# Patient Record
Sex: Female | Born: 1940 | Race: White | Hispanic: No | Smoking: Former smoker
Health system: Southern US, Community
[De-identification: ages and names within clinical notes are randomized; demographics above are authoritative.]

## PROBLEM LIST (undated history)

## (undated) DIAGNOSIS — C55 Malignant neoplasm of uterus, part unspecified: Secondary | ICD-10-CM

## (undated) DIAGNOSIS — Z8619 Personal history of other infectious and parasitic diseases: Secondary | ICD-10-CM

## (undated) DIAGNOSIS — G4700 Insomnia, unspecified: Secondary | ICD-10-CM

## (undated) DIAGNOSIS — F419 Anxiety disorder, unspecified: Secondary | ICD-10-CM

## (undated) DIAGNOSIS — K5790 Diverticulosis of intestine, part unspecified, without perforation or abscess without bleeding: Secondary | ICD-10-CM

## (undated) DIAGNOSIS — K648 Other hemorrhoids: Secondary | ICD-10-CM

## (undated) DIAGNOSIS — M1990 Unspecified osteoarthritis, unspecified site: Secondary | ICD-10-CM

## (undated) DIAGNOSIS — F32A Depression, unspecified: Secondary | ICD-10-CM

## (undated) DIAGNOSIS — M2550 Pain in unspecified joint: Secondary | ICD-10-CM

## (undated) DIAGNOSIS — F329 Major depressive disorder, single episode, unspecified: Secondary | ICD-10-CM

## (undated) DIAGNOSIS — G8929 Other chronic pain: Secondary | ICD-10-CM

## (undated) DIAGNOSIS — M779 Enthesopathy, unspecified: Secondary | ICD-10-CM

## (undated) DIAGNOSIS — M549 Dorsalgia, unspecified: Secondary | ICD-10-CM

## (undated) DIAGNOSIS — D649 Anemia, unspecified: Secondary | ICD-10-CM

## (undated) DIAGNOSIS — E785 Hyperlipidemia, unspecified: Secondary | ICD-10-CM

## (undated) DIAGNOSIS — G0490 Encephalitis and encephalomyelitis, unspecified: Secondary | ICD-10-CM

## (undated) DIAGNOSIS — K589 Irritable bowel syndrome without diarrhea: Secondary | ICD-10-CM

## (undated) DIAGNOSIS — K759 Inflammatory liver disease, unspecified: Secondary | ICD-10-CM

## (undated) DIAGNOSIS — J189 Pneumonia, unspecified organism: Secondary | ICD-10-CM

## (undated) DIAGNOSIS — K219 Gastro-esophageal reflux disease without esophagitis: Secondary | ICD-10-CM

## (undated) DIAGNOSIS — R918 Other nonspecific abnormal finding of lung field: Secondary | ICD-10-CM

## (undated) DIAGNOSIS — M2540 Effusion, unspecified joint: Secondary | ICD-10-CM

## (undated) DIAGNOSIS — F0390 Unspecified dementia without behavioral disturbance: Secondary | ICD-10-CM

## (undated) DIAGNOSIS — R42 Dizziness and giddiness: Secondary | ICD-10-CM

## (undated) DIAGNOSIS — K760 Fatty (change of) liver, not elsewhere classified: Secondary | ICD-10-CM

## (undated) DIAGNOSIS — Z860100 Personal history of colon polyps, unspecified: Secondary | ICD-10-CM

## (undated) DIAGNOSIS — N281 Cyst of kidney, acquired: Secondary | ICD-10-CM

## (undated) DIAGNOSIS — Z8709 Personal history of other diseases of the respiratory system: Secondary | ICD-10-CM

## (undated) DIAGNOSIS — G43909 Migraine, unspecified, not intractable, without status migrainosus: Secondary | ICD-10-CM

## (undated) HISTORY — DX: Depression, unspecified: F32.A

## (undated) HISTORY — DX: Inflammatory liver disease, unspecified: K75.9

## (undated) HISTORY — DX: Migraine, unspecified, not intractable, without status migrainosus: G43.909

## (undated) HISTORY — DX: Unspecified dementia, unspecified severity, without behavioral disturbance, psychotic disturbance, mood disturbance, and anxiety: F03.90

## (undated) HISTORY — PX: COLONOSCOPY: SHX174

## (undated) HISTORY — DX: Malignant neoplasm of uterus, part unspecified: C55

## (undated) HISTORY — PX: TONSILLECTOMY: SUR1361

## (undated) HISTORY — PX: UPPER GASTROINTESTINAL ENDOSCOPY: SHX188

## (undated) HISTORY — DX: Encephalitis and encephalomyelitis, unspecified: G04.90

## (undated) HISTORY — PX: OTHER SURGICAL HISTORY: SHX169

## (undated) HISTORY — DX: Anxiety disorder, unspecified: F41.9

## (undated) HISTORY — DX: Gastro-esophageal reflux disease without esophagitis: K21.9

## (undated) HISTORY — DX: Enthesopathy, unspecified: M77.9

## (undated) HISTORY — DX: Other hemorrhoids: K64.8

## (undated) HISTORY — DX: Major depressive disorder, single episode, unspecified: F32.9

## (undated) HISTORY — DX: Diverticulosis of intestine, part unspecified, without perforation or abscess without bleeding: K57.90

## (undated) HISTORY — DX: Hyperlipidemia, unspecified: E78.5

## (undated) HISTORY — DX: Other nonspecific abnormal finding of lung field: R91.8

## (undated) HISTORY — DX: Anemia, unspecified: D64.9

## (undated) HISTORY — DX: Unspecified osteoarthritis, unspecified site: M19.90

---

## 1964-02-08 HISTORY — PX: APPENDECTOMY: SHX54

## 1971-02-08 DIAGNOSIS — C55 Malignant neoplasm of uterus, part unspecified: Secondary | ICD-10-CM

## 1971-02-08 HISTORY — PX: VAGINAL HYSTERECTOMY: SUR661

## 1971-02-08 HISTORY — DX: Malignant neoplasm of uterus, part unspecified: C55

## 2000-04-27 ENCOUNTER — Encounter: Admission: RE | Admit: 2000-04-27 | Discharge: 2000-04-27 | Payer: Self-pay | Admitting: Hematology and Oncology

## 2000-06-09 ENCOUNTER — Encounter: Admission: RE | Admit: 2000-06-09 | Discharge: 2000-06-09 | Payer: Self-pay | Admitting: Internal Medicine

## 2000-06-09 ENCOUNTER — Encounter: Payer: Self-pay | Admitting: Internal Medicine

## 2000-06-09 ENCOUNTER — Ambulatory Visit (HOSPITAL_COMMUNITY): Admission: RE | Admit: 2000-06-09 | Discharge: 2000-06-09 | Payer: Self-pay | Admitting: Internal Medicine

## 2000-08-16 ENCOUNTER — Encounter: Admission: RE | Admit: 2000-08-16 | Discharge: 2000-08-16 | Payer: Self-pay | Admitting: Internal Medicine

## 2000-09-29 ENCOUNTER — Encounter: Admission: RE | Admit: 2000-09-29 | Discharge: 2000-09-29 | Payer: Self-pay | Admitting: Internal Medicine

## 2000-09-29 ENCOUNTER — Encounter: Payer: Self-pay | Admitting: Internal Medicine

## 2000-10-06 ENCOUNTER — Encounter: Payer: Self-pay | Admitting: Internal Medicine

## 2000-10-06 ENCOUNTER — Ambulatory Visit (HOSPITAL_COMMUNITY): Admission: RE | Admit: 2000-10-06 | Discharge: 2000-10-06 | Payer: Self-pay | Admitting: Internal Medicine

## 2000-11-22 ENCOUNTER — Encounter: Admission: RE | Admit: 2000-11-22 | Discharge: 2000-11-22 | Payer: Self-pay | Admitting: Internal Medicine

## 2001-03-07 ENCOUNTER — Encounter: Admission: RE | Admit: 2001-03-07 | Discharge: 2001-03-07 | Payer: Self-pay

## 2001-04-11 ENCOUNTER — Encounter: Admission: RE | Admit: 2001-04-11 | Discharge: 2001-04-11 | Payer: Self-pay

## 2001-04-11 ENCOUNTER — Encounter: Payer: Self-pay | Admitting: Internal Medicine

## 2001-04-11 ENCOUNTER — Ambulatory Visit (HOSPITAL_COMMUNITY): Admission: RE | Admit: 2001-04-11 | Discharge: 2001-04-11 | Payer: Self-pay | Admitting: Internal Medicine

## 2001-05-04 ENCOUNTER — Encounter: Payer: Self-pay | Admitting: Urology

## 2001-05-04 ENCOUNTER — Ambulatory Visit (HOSPITAL_COMMUNITY): Admission: RE | Admit: 2001-05-04 | Discharge: 2001-05-04 | Payer: Self-pay | Admitting: Urology

## 2001-07-02 ENCOUNTER — Encounter: Admission: RE | Admit: 2001-07-02 | Discharge: 2001-07-02 | Payer: Self-pay | Admitting: Internal Medicine

## 2001-08-21 ENCOUNTER — Encounter: Admission: RE | Admit: 2001-08-21 | Discharge: 2001-08-21 | Payer: Self-pay | Admitting: Internal Medicine

## 2001-09-03 ENCOUNTER — Encounter: Admission: RE | Admit: 2001-09-03 | Discharge: 2001-09-03 | Payer: Self-pay | Admitting: Internal Medicine

## 2001-11-07 ENCOUNTER — Encounter: Payer: Self-pay | Admitting: Orthopedic Surgery

## 2001-11-07 ENCOUNTER — Encounter: Admission: RE | Admit: 2001-11-07 | Discharge: 2001-11-07 | Payer: Self-pay | Admitting: Orthopedic Surgery

## 2001-11-20 ENCOUNTER — Encounter: Admission: RE | Admit: 2001-11-20 | Discharge: 2001-12-25 | Payer: Self-pay | Admitting: Orthopedic Surgery

## 2001-11-26 ENCOUNTER — Encounter: Admission: RE | Admit: 2001-11-26 | Discharge: 2001-11-26 | Payer: Self-pay | Admitting: Internal Medicine

## 2001-12-10 ENCOUNTER — Ambulatory Visit (HOSPITAL_COMMUNITY): Admission: RE | Admit: 2001-12-10 | Discharge: 2001-12-10 | Payer: Self-pay | Admitting: Internal Medicine

## 2001-12-19 ENCOUNTER — Ambulatory Visit (HOSPITAL_COMMUNITY): Admission: RE | Admit: 2001-12-19 | Discharge: 2001-12-19 | Payer: Self-pay | Admitting: Internal Medicine

## 2001-12-19 ENCOUNTER — Encounter: Payer: Self-pay | Admitting: Internal Medicine

## 2002-04-29 ENCOUNTER — Ambulatory Visit (HOSPITAL_COMMUNITY): Admission: RE | Admit: 2002-04-29 | Discharge: 2002-04-29 | Payer: Self-pay | Admitting: Internal Medicine

## 2002-04-29 ENCOUNTER — Encounter: Admission: RE | Admit: 2002-04-29 | Discharge: 2002-04-29 | Payer: Self-pay | Admitting: Internal Medicine

## 2002-10-29 ENCOUNTER — Encounter: Admission: RE | Admit: 2002-10-29 | Discharge: 2002-10-29 | Payer: Self-pay | Admitting: Internal Medicine

## 2002-11-28 ENCOUNTER — Encounter: Admission: RE | Admit: 2002-11-28 | Discharge: 2002-11-28 | Payer: Self-pay | Admitting: Internal Medicine

## 2002-12-06 ENCOUNTER — Encounter: Admission: RE | Admit: 2002-12-06 | Discharge: 2002-12-06 | Payer: Self-pay | Admitting: Family Medicine

## 2003-01-01 ENCOUNTER — Encounter: Admission: RE | Admit: 2003-01-01 | Discharge: 2003-01-01 | Payer: Self-pay | Admitting: Internal Medicine

## 2003-02-14 ENCOUNTER — Emergency Department (HOSPITAL_COMMUNITY): Admission: AD | Admit: 2003-02-14 | Discharge: 2003-02-14 | Payer: Self-pay | Admitting: Family Medicine

## 2003-09-01 ENCOUNTER — Encounter: Admission: RE | Admit: 2003-09-01 | Discharge: 2003-09-01 | Payer: Self-pay | Admitting: Internal Medicine

## 2003-09-05 ENCOUNTER — Encounter: Admission: RE | Admit: 2003-09-05 | Discharge: 2003-09-05 | Payer: Self-pay | Admitting: Internal Medicine

## 2003-09-10 ENCOUNTER — Ambulatory Visit (HOSPITAL_COMMUNITY): Admission: RE | Admit: 2003-09-10 | Discharge: 2003-09-10 | Payer: Self-pay | Admitting: Internal Medicine

## 2003-11-13 ENCOUNTER — Ambulatory Visit: Payer: Self-pay | Admitting: Internal Medicine

## 2003-12-31 ENCOUNTER — Emergency Department (HOSPITAL_COMMUNITY): Admission: EM | Admit: 2003-12-31 | Discharge: 2003-12-31 | Payer: Self-pay | Admitting: Family Medicine

## 2004-01-07 ENCOUNTER — Ambulatory Visit: Payer: Self-pay | Admitting: Internal Medicine

## 2004-01-14 ENCOUNTER — Ambulatory Visit: Payer: Self-pay | Admitting: Internal Medicine

## 2004-01-19 ENCOUNTER — Ambulatory Visit: Payer: Self-pay | Admitting: Internal Medicine

## 2004-01-25 ENCOUNTER — Emergency Department (HOSPITAL_COMMUNITY): Admission: EM | Admit: 2004-01-25 | Discharge: 2004-01-25 | Payer: Self-pay | Admitting: Family Medicine

## 2004-02-12 ENCOUNTER — Emergency Department (HOSPITAL_COMMUNITY): Admission: EM | Admit: 2004-02-12 | Discharge: 2004-02-12 | Payer: Self-pay | Admitting: Family Medicine

## 2004-04-08 ENCOUNTER — Ambulatory Visit: Payer: Self-pay | Admitting: Internal Medicine

## 2004-04-15 ENCOUNTER — Ambulatory Visit: Payer: Self-pay | Admitting: Internal Medicine

## 2004-04-18 ENCOUNTER — Ambulatory Visit (HOSPITAL_COMMUNITY): Admission: RE | Admit: 2004-04-18 | Discharge: 2004-04-18 | Payer: Self-pay | Admitting: Internal Medicine

## 2004-07-01 ENCOUNTER — Ambulatory Visit: Payer: Self-pay | Admitting: Internal Medicine

## 2004-10-15 ENCOUNTER — Ambulatory Visit: Payer: Self-pay | Admitting: *Deleted

## 2004-10-18 ENCOUNTER — Ambulatory Visit: Payer: Self-pay | Admitting: *Deleted

## 2004-10-22 ENCOUNTER — Ambulatory Visit: Payer: Self-pay | Admitting: *Deleted

## 2004-10-25 ENCOUNTER — Ambulatory Visit: Payer: Self-pay | Admitting: *Deleted

## 2004-11-01 ENCOUNTER — Ambulatory Visit: Payer: Self-pay | Admitting: *Deleted

## 2004-11-08 ENCOUNTER — Ambulatory Visit: Payer: Self-pay | Admitting: *Deleted

## 2004-11-22 ENCOUNTER — Ambulatory Visit: Payer: Self-pay | Admitting: *Deleted

## 2004-11-26 ENCOUNTER — Ambulatory Visit: Payer: Self-pay | Admitting: Internal Medicine

## 2004-11-26 ENCOUNTER — Ambulatory Visit: Payer: Self-pay | Admitting: *Deleted

## 2004-11-29 ENCOUNTER — Ambulatory Visit: Payer: Self-pay | Admitting: *Deleted

## 2004-12-15 ENCOUNTER — Ambulatory Visit: Payer: Self-pay | Admitting: *Deleted

## 2004-12-20 ENCOUNTER — Ambulatory Visit: Payer: Self-pay | Admitting: *Deleted

## 2004-12-23 ENCOUNTER — Ambulatory Visit: Payer: Self-pay | Admitting: Family Medicine

## 2005-01-03 ENCOUNTER — Ambulatory Visit: Payer: Self-pay | Admitting: *Deleted

## 2005-01-17 ENCOUNTER — Ambulatory Visit: Payer: Self-pay | Admitting: *Deleted

## 2005-02-18 ENCOUNTER — Ambulatory Visit: Payer: Self-pay | Admitting: *Deleted

## 2005-03-03 ENCOUNTER — Ambulatory Visit: Payer: Self-pay | Admitting: Family Medicine

## 2005-03-04 ENCOUNTER — Ambulatory Visit: Payer: Self-pay | Admitting: *Deleted

## 2005-03-09 ENCOUNTER — Ambulatory Visit (HOSPITAL_COMMUNITY): Admission: RE | Admit: 2005-03-09 | Discharge: 2005-03-09 | Payer: Self-pay | Admitting: Family Medicine

## 2005-03-18 ENCOUNTER — Ambulatory Visit: Payer: Self-pay | Admitting: Family Medicine

## 2005-03-28 ENCOUNTER — Ambulatory Visit: Payer: Self-pay | Admitting: *Deleted

## 2005-04-11 ENCOUNTER — Ambulatory Visit: Payer: Self-pay | Admitting: *Deleted

## 2005-04-25 ENCOUNTER — Ambulatory Visit: Payer: Self-pay | Admitting: *Deleted

## 2005-05-09 ENCOUNTER — Ambulatory Visit: Payer: Self-pay | Admitting: *Deleted

## 2005-05-16 ENCOUNTER — Ambulatory Visit: Payer: Self-pay | Admitting: *Deleted

## 2005-05-23 ENCOUNTER — Ambulatory Visit: Payer: Self-pay | Admitting: *Deleted

## 2005-05-30 ENCOUNTER — Ambulatory Visit: Payer: Self-pay | Admitting: *Deleted

## 2005-06-06 ENCOUNTER — Ambulatory Visit: Payer: Self-pay | Admitting: *Deleted

## 2005-06-20 ENCOUNTER — Ambulatory Visit: Payer: Self-pay | Admitting: *Deleted

## 2005-07-08 ENCOUNTER — Ambulatory Visit (HOSPITAL_COMMUNITY): Admission: RE | Admit: 2005-07-08 | Discharge: 2005-07-08 | Payer: Self-pay | Admitting: Neurological Surgery

## 2005-07-11 ENCOUNTER — Ambulatory Visit: Payer: Self-pay | Admitting: *Deleted

## 2005-07-19 ENCOUNTER — Ambulatory Visit: Payer: Self-pay | Admitting: Family Medicine

## 2005-07-25 ENCOUNTER — Ambulatory Visit: Payer: Self-pay | Admitting: *Deleted

## 2005-08-01 ENCOUNTER — Ambulatory Visit: Payer: Self-pay | Admitting: *Deleted

## 2005-08-08 ENCOUNTER — Ambulatory Visit: Payer: Self-pay | Admitting: Family Medicine

## 2005-08-17 ENCOUNTER — Ambulatory Visit: Payer: Self-pay | Admitting: *Deleted

## 2005-08-24 ENCOUNTER — Ambulatory Visit: Payer: Self-pay | Admitting: *Deleted

## 2005-08-31 ENCOUNTER — Ambulatory Visit: Payer: Self-pay | Admitting: *Deleted

## 2005-09-07 ENCOUNTER — Ambulatory Visit: Payer: Self-pay | Admitting: *Deleted

## 2005-09-12 ENCOUNTER — Ambulatory Visit: Payer: Self-pay | Admitting: Family Medicine

## 2005-10-19 ENCOUNTER — Ambulatory Visit: Payer: Self-pay | Admitting: *Deleted

## 2005-10-24 ENCOUNTER — Ambulatory Visit: Payer: Self-pay | Admitting: Family Medicine

## 2005-11-02 ENCOUNTER — Ambulatory Visit: Payer: Self-pay | Admitting: *Deleted

## 2005-11-08 ENCOUNTER — Ambulatory Visit: Payer: Self-pay | Admitting: Family Medicine

## 2005-11-14 ENCOUNTER — Ambulatory Visit (HOSPITAL_COMMUNITY): Admission: RE | Admit: 2005-11-14 | Discharge: 2005-11-14 | Payer: Self-pay | Admitting: Family Medicine

## 2005-11-15 DIAGNOSIS — F419 Anxiety disorder, unspecified: Secondary | ICD-10-CM | POA: Insufficient documentation

## 2005-11-15 DIAGNOSIS — F411 Generalized anxiety disorder: Secondary | ICD-10-CM | POA: Insufficient documentation

## 2005-11-15 DIAGNOSIS — M129 Arthropathy, unspecified: Secondary | ICD-10-CM | POA: Insufficient documentation

## 2005-11-15 DIAGNOSIS — K589 Irritable bowel syndrome without diarrhea: Secondary | ICD-10-CM | POA: Insufficient documentation

## 2005-11-15 DIAGNOSIS — F32A Depression, unspecified: Secondary | ICD-10-CM | POA: Insufficient documentation

## 2005-12-21 ENCOUNTER — Ambulatory Visit: Payer: Self-pay | Admitting: *Deleted

## 2005-12-22 ENCOUNTER — Ambulatory Visit: Payer: Self-pay | Admitting: Family Medicine

## 2005-12-22 DIAGNOSIS — J984 Other disorders of lung: Secondary | ICD-10-CM | POA: Insufficient documentation

## 2005-12-22 DIAGNOSIS — M48061 Spinal stenosis, lumbar region without neurogenic claudication: Secondary | ICD-10-CM | POA: Insufficient documentation

## 2005-12-22 LAB — CONVERTED CEMR LAB: Blood Glucose, Fasting: 121 mg/dL

## 2005-12-30 ENCOUNTER — Ambulatory Visit: Payer: Self-pay | Admitting: Family Medicine

## 2006-01-02 ENCOUNTER — Ambulatory Visit: Payer: Self-pay | Admitting: Family Medicine

## 2006-01-02 ENCOUNTER — Telehealth: Payer: Self-pay | Admitting: Family Medicine

## 2006-01-04 ENCOUNTER — Telehealth: Payer: Self-pay | Admitting: Family Medicine

## 2006-01-11 ENCOUNTER — Ambulatory Visit: Payer: Self-pay | Admitting: *Deleted

## 2006-01-16 ENCOUNTER — Ambulatory Visit: Payer: Self-pay | Admitting: Family Medicine

## 2006-01-23 ENCOUNTER — Ambulatory Visit (HOSPITAL_COMMUNITY): Admission: RE | Admit: 2006-01-23 | Discharge: 2006-01-23 | Payer: Self-pay | Admitting: Family Medicine

## 2006-01-24 ENCOUNTER — Ambulatory Visit: Payer: Self-pay | Admitting: Family Medicine

## 2006-03-15 ENCOUNTER — Telehealth: Payer: Self-pay | Admitting: Family Medicine

## 2006-05-19 ENCOUNTER — Ambulatory Visit: Payer: Self-pay | Admitting: *Deleted

## 2006-06-10 ENCOUNTER — Emergency Department (HOSPITAL_COMMUNITY): Admission: EM | Admit: 2006-06-10 | Discharge: 2006-06-10 | Payer: Self-pay | Admitting: Family Medicine

## 2006-08-25 ENCOUNTER — Ambulatory Visit: Payer: Self-pay | Admitting: Family Medicine

## 2006-08-25 DIAGNOSIS — K5900 Constipation, unspecified: Secondary | ICD-10-CM | POA: Insufficient documentation

## 2006-08-25 LAB — CONVERTED CEMR LAB
Blood Glucose, Fasting: 101 mg/dL
Hgb A1c MFr Bld: 5.6 %

## 2006-09-12 ENCOUNTER — Encounter: Payer: Self-pay | Admitting: Family Medicine

## 2006-11-01 ENCOUNTER — Ambulatory Visit: Payer: Self-pay | Admitting: *Deleted

## 2006-11-08 ENCOUNTER — Ambulatory Visit: Payer: Self-pay | Admitting: Family Medicine

## 2006-11-24 ENCOUNTER — Ambulatory Visit: Payer: Self-pay | Admitting: Family Medicine

## 2006-11-24 LAB — CONVERTED CEMR LAB
ALT: 28 units/L (ref 0–35)
AST: 27 units/L (ref 0–37)
Albumin: 4.5 g/dL (ref 3.5–5.2)
Alkaline Phosphatase: 88 units/L (ref 39–117)
Anti Nuclear Antibody(ANA): NEGATIVE
BUN: 12 mg/dL (ref 6–23)
CO2: 26 meq/L (ref 19–32)
Calcium: 9.8 mg/dL (ref 8.4–10.5)
Chloride: 104 meq/L (ref 96–112)
Cholesterol: 187 mg/dL (ref 0–200)
Creatinine, Ser: 0.76 mg/dL (ref 0.40–1.20)
Glucose, Bld: 99 mg/dL (ref 70–99)
HCT: 42.2 % (ref 36.0–46.0)
HDL: 51 mg/dL (ref >39)
Hemoglobin: 13.9 g/dL (ref 12.0–15.0)
LDL Cholesterol: 101 mg/dL — ABNORMAL HIGH (ref 0–99)
MCHC: 32.9 g/dL (ref 30.0–36.0)
MCV: 86.5 fL (ref 78.0–100.0)
Platelets: 240 10*3/uL (ref 150–400)
Potassium: 5.1 meq/L (ref 3.5–5.3)
RBC: 4.88 M/uL (ref 3.87–5.11)
RDW: 13.6 % (ref 11.5–14.0)
Rhuematoid fact SerPl-aCnc: <20 intl units/mL (ref 0–20)
Sed Rate: 15 mm/hr (ref 0–22)
Sodium: 143 meq/L (ref 135–145)
TSH: 1.722 microintl units/mL (ref 0.350–5.50)
Total Bilirubin: 0.3 mg/dL (ref 0.3–1.2)
Total CHOL/HDL Ratio: 3.7
Total Protein: 7.2 g/dL (ref 6.0–8.3)
Triglycerides: 174 mg/dL — ABNORMAL HIGH (ref <150)
VLDL: 35 mg/dL (ref 0–40)
WBC: 5.8 10*3/uL (ref 4.0–10.5)

## 2006-11-28 ENCOUNTER — Telehealth: Payer: Self-pay | Admitting: Family Medicine

## 2006-11-29 ENCOUNTER — Ambulatory Visit (HOSPITAL_COMMUNITY): Admission: RE | Admit: 2006-11-29 | Discharge: 2006-11-29 | Payer: Self-pay | Admitting: Family Medicine

## 2007-01-03 ENCOUNTER — Ambulatory Visit: Payer: Self-pay | Admitting: *Deleted

## 2007-01-15 ENCOUNTER — Ambulatory Visit: Payer: Self-pay | Admitting: Family Medicine

## 2007-01-19 ENCOUNTER — Ambulatory Visit: Payer: Self-pay | Admitting: *Deleted

## 2007-02-07 ENCOUNTER — Ambulatory Visit: Payer: Self-pay | Admitting: *Deleted

## 2007-02-08 HISTORY — PX: ROTATOR CUFF REPAIR: SHX139

## 2007-04-16 ENCOUNTER — Ambulatory Visit: Payer: Self-pay | Admitting: Family Medicine

## 2007-06-04 ENCOUNTER — Encounter: Admission: RE | Admit: 2007-06-04 | Discharge: 2007-06-04 | Payer: Self-pay | Admitting: Family Medicine

## 2007-06-04 ENCOUNTER — Ambulatory Visit: Payer: Self-pay | Admitting: Family Medicine

## 2007-06-05 ENCOUNTER — Ambulatory Visit: Payer: Self-pay | Admitting: Family Medicine

## 2007-06-05 DIAGNOSIS — R3129 Other microscopic hematuria: Secondary | ICD-10-CM | POA: Insufficient documentation

## 2007-06-05 LAB — CONVERTED CEMR LAB
Bilirubin Urine: NEGATIVE
Glucose, Urine, Semiquant: NEGATIVE
Ketones, urine, test strip: NEGATIVE
Nitrite: NEGATIVE
Protein, U semiquant: NEGATIVE
Specific Gravity, Urine: <1.005
Urobilinogen, UA: 0.2
pH: 5.5

## 2007-06-06 ENCOUNTER — Encounter: Payer: Self-pay | Admitting: Family Medicine

## 2007-06-06 LAB — CONVERTED CEMR LAB
Bacteria, UA: NONE SEEN
Bilirubin Urine: NEGATIVE
Hemoglobin, Urine: NEGATIVE
Ketones, ur: NEGATIVE mg/dL
Nitrite: NEGATIVE
Protein, ur: NEGATIVE mg/dL
RBC / HPF: NONE SEEN (ref <3)
Specific Gravity, Urine: 1.01 (ref 1.005–1.03)
Urine Glucose: NEGATIVE mg/dL
Urobilinogen, UA: 0.2 (ref 0.0–1.0)
WBC, UA: NONE SEEN cells/hpf (ref <3)
pH: 5.5 (ref 5.0–8.0)

## 2007-06-13 ENCOUNTER — Encounter: Payer: Self-pay | Admitting: Family Medicine

## 2007-09-26 ENCOUNTER — Ambulatory Visit: Payer: Self-pay | Admitting: Family Medicine

## 2007-11-08 ENCOUNTER — Encounter: Admission: RE | Admit: 2007-11-08 | Discharge: 2007-11-28 | Payer: Self-pay | Admitting: Orthopedic Surgery

## 2007-11-12 ENCOUNTER — Ambulatory Visit: Payer: Self-pay | Admitting: Family Medicine

## 2007-11-20 ENCOUNTER — Telehealth: Payer: Self-pay | Admitting: Family Medicine

## 2007-11-21 ENCOUNTER — Ambulatory Visit: Payer: Self-pay | Admitting: Family Medicine

## 2007-12-04 ENCOUNTER — Ambulatory Visit (HOSPITAL_COMMUNITY): Admission: RE | Admit: 2007-12-04 | Discharge: 2007-12-04 | Payer: Self-pay | Admitting: Family Medicine

## 2007-12-05 DIAGNOSIS — Z780 Asymptomatic menopausal state: Secondary | ICD-10-CM | POA: Insufficient documentation

## 2007-12-14 ENCOUNTER — Ambulatory Visit: Payer: Self-pay | Admitting: Family Medicine

## 2007-12-17 ENCOUNTER — Telehealth: Payer: Self-pay | Admitting: Internal Medicine

## 2007-12-17 ENCOUNTER — Encounter: Payer: Self-pay | Admitting: Family Medicine

## 2007-12-17 ENCOUNTER — Encounter: Admission: RE | Admit: 2007-12-17 | Discharge: 2007-12-17 | Payer: Self-pay | Admitting: Family Medicine

## 2007-12-18 LAB — CONVERTED CEMR LAB
Cholesterol, target level: 200 mg/dL
Cholesterol: 157 mg/dL (ref 0–200)
HDL goal, serum: 40 mg/dL
HDL: 42 mg/dL (ref >39)
LDL Cholesterol: 86 mg/dL (ref 0–99)
LDL Goal: 160 mg/dL
Total CHOL/HDL Ratio: 3.7
Triglycerides: 147 mg/dL (ref <150)
VLDL: 29 mg/dL (ref 0–40)

## 2008-01-09 ENCOUNTER — Telehealth: Payer: Self-pay | Admitting: Family Medicine

## 2008-01-16 DIAGNOSIS — K5730 Diverticulosis of large intestine without perforation or abscess without bleeding: Secondary | ICD-10-CM | POA: Insufficient documentation

## 2008-01-16 DIAGNOSIS — K648 Other hemorrhoids: Secondary | ICD-10-CM | POA: Insufficient documentation

## 2008-01-17 ENCOUNTER — Ambulatory Visit: Payer: Self-pay | Admitting: Internal Medicine

## 2008-01-17 LAB — CONVERTED CEMR LAB
BUN: 15 mg/dL (ref 6–23)
Creatinine, Ser: 0.8 mg/dL (ref 0.4–1.2)

## 2008-01-21 ENCOUNTER — Ambulatory Visit: Payer: Self-pay | Admitting: Cardiology

## 2008-01-21 ENCOUNTER — Encounter: Payer: Self-pay | Admitting: Internal Medicine

## 2008-01-21 DIAGNOSIS — N289 Disorder of kidney and ureter, unspecified: Secondary | ICD-10-CM | POA: Insufficient documentation

## 2008-01-22 ENCOUNTER — Telehealth: Payer: Self-pay | Admitting: Family Medicine

## 2008-01-24 ENCOUNTER — Ambulatory Visit (HOSPITAL_COMMUNITY): Admission: RE | Admit: 2008-01-24 | Discharge: 2008-01-24 | Payer: Self-pay | Admitting: Internal Medicine

## 2008-01-25 ENCOUNTER — Telehealth: Payer: Self-pay | Admitting: Internal Medicine

## 2008-02-28 ENCOUNTER — Ambulatory Visit: Payer: Self-pay | Admitting: Family Medicine

## 2008-02-28 LAB — CONVERTED CEMR LAB: Rapid Strep: NEGATIVE

## 2008-03-07 ENCOUNTER — Telehealth: Payer: Self-pay | Admitting: Family Medicine

## 2008-04-06 ENCOUNTER — Encounter: Payer: Self-pay | Admitting: Emergency Medicine

## 2008-04-06 ENCOUNTER — Encounter: Payer: Self-pay | Admitting: Family Medicine

## 2008-04-06 ENCOUNTER — Observation Stay (HOSPITAL_COMMUNITY): Admission: EM | Admit: 2008-04-06 | Discharge: 2008-04-07 | Payer: Self-pay | Admitting: Family Medicine

## 2008-04-06 ENCOUNTER — Ambulatory Visit: Payer: Self-pay | Admitting: Family Medicine

## 2008-04-11 ENCOUNTER — Ambulatory Visit: Payer: Self-pay | Admitting: Family Medicine

## 2008-04-11 DIAGNOSIS — M542 Cervicalgia: Secondary | ICD-10-CM

## 2008-04-11 DIAGNOSIS — G8929 Other chronic pain: Secondary | ICD-10-CM | POA: Insufficient documentation

## 2008-04-14 ENCOUNTER — Encounter: Admission: RE | Admit: 2008-04-14 | Discharge: 2008-05-08 | Payer: Self-pay | Admitting: Family Medicine

## 2008-04-16 ENCOUNTER — Telehealth (INDEPENDENT_AMBULATORY_CARE_PROVIDER_SITE_OTHER): Payer: Self-pay

## 2008-04-17 ENCOUNTER — Ambulatory Visit: Payer: Self-pay

## 2008-04-17 ENCOUNTER — Encounter: Payer: Self-pay | Admitting: Cardiology

## 2008-04-24 ENCOUNTER — Encounter: Payer: Self-pay | Admitting: Family Medicine

## 2008-05-07 ENCOUNTER — Encounter: Payer: Self-pay | Admitting: Family Medicine

## 2008-05-23 ENCOUNTER — Ambulatory Visit: Payer: Self-pay | Admitting: Family Medicine

## 2008-05-26 LAB — CONVERTED CEMR LAB
Folate: >20 ng/mL
HCT: 41.1 % (ref 36.0–46.0)
Hemoglobin: 13.1 g/dL (ref 12.0–15.0)
MCHC: 31.9 g/dL (ref 30.0–36.0)
MCV: 87.1 fL (ref 78.0–100.0)
Platelets: 218 10*3/uL (ref 150–400)
RBC: 4.72 M/uL (ref 3.87–5.11)
RDW: 14.3 % (ref 11.5–15.5)
TSH: 1.92 microintl units/mL (ref 0.350–4.500)
Vit D, 25-Hydroxy: 36 ng/mL (ref 30–89)
Vitamin B-12: 501 pg/mL (ref 211–911)
WBC: 5.6 10*3/uL (ref 4.0–10.5)

## 2008-05-28 ENCOUNTER — Telehealth: Payer: Self-pay | Admitting: Family Medicine

## 2008-05-31 ENCOUNTER — Encounter: Admission: RE | Admit: 2008-05-31 | Discharge: 2008-05-31 | Payer: Self-pay | Admitting: Family Medicine

## 2008-06-06 ENCOUNTER — Telehealth: Payer: Self-pay | Admitting: Family Medicine

## 2008-06-06 ENCOUNTER — Ambulatory Visit: Payer: Self-pay | Admitting: Physical Medicine & Rehabilitation

## 2008-06-12 ENCOUNTER — Telehealth: Payer: Self-pay | Admitting: Family Medicine

## 2008-06-24 ENCOUNTER — Telehealth (INDEPENDENT_AMBULATORY_CARE_PROVIDER_SITE_OTHER): Payer: Self-pay | Admitting: *Deleted

## 2008-07-08 ENCOUNTER — Telehealth: Payer: Self-pay | Admitting: Family Medicine

## 2008-07-08 ENCOUNTER — Ambulatory Visit: Payer: Self-pay | Admitting: Family Medicine

## 2008-07-08 DIAGNOSIS — G4700 Insomnia, unspecified: Secondary | ICD-10-CM | POA: Insufficient documentation

## 2008-07-08 DIAGNOSIS — F5101 Primary insomnia: Secondary | ICD-10-CM | POA: Insufficient documentation

## 2008-07-08 DIAGNOSIS — B351 Tinea unguium: Secondary | ICD-10-CM | POA: Insufficient documentation

## 2008-07-09 ENCOUNTER — Encounter: Payer: Self-pay | Admitting: Family Medicine

## 2008-07-09 LAB — CONVERTED CEMR LAB
ALT: 31 units/L (ref 0–35)
AST: 27 units/L (ref 0–37)
Albumin: 4.5 g/dL (ref 3.5–5.2)
Alkaline Phosphatase: 86 units/L (ref 39–117)
BUN: 12 mg/dL (ref 6–23)
CO2: 27 meq/L (ref 19–32)
Calcium: 10 mg/dL (ref 8.4–10.5)
Chloride: 104 meq/L (ref 96–112)
Creatinine, Ser: 0.87 mg/dL (ref 0.40–1.20)
Glucose, Bld: 102 mg/dL — ABNORMAL HIGH (ref 70–99)
HCT: 42.3 % (ref 36.0–46.0)
Hemoglobin: 13.7 g/dL (ref 12.0–15.0)
MCHC: 32.4 g/dL (ref 30.0–36.0)
MCV: 86 fL (ref 78.0–100.0)
Platelets: 236 10*3/uL (ref 150–400)
Potassium: 4.6 meq/L (ref 3.5–5.3)
RBC: 4.92 M/uL (ref 3.87–5.11)
RDW: 14.1 % (ref 11.5–15.5)
Sodium: 143 meq/L (ref 135–145)
Total Bilirubin: 0.2 mg/dL — ABNORMAL LOW (ref 0.3–1.2)
Total Protein: 7.3 g/dL (ref 6.0–8.3)
WBC: 6.3 10*3/uL (ref 4.0–10.5)

## 2008-07-11 ENCOUNTER — Encounter: Payer: Self-pay | Admitting: Family Medicine

## 2008-07-15 LAB — CONVERTED CEMR LAB
Collection Interval-CRCL: 24 hr
Creatinine 24 HR UR: 1253 mg/24hr (ref 700–1800)
Creatinine Clearance: 101 mL/min (ref 75–115)
Creatinine, Urine: 37.4 mg/dL
Metaneph Total, Ur: 289 ug/24hr (ref 224–832)
Metanephrines, Ur: 54 — ABNORMAL LOW (ref 90–315)
Normetanephrine, 24H Ur: 235 (ref 122–676)
VMA, 24H Ur Adult: 2.7 mg/24hr (ref 0.0–7.0)

## 2008-07-16 ENCOUNTER — Encounter: Payer: Self-pay | Admitting: Family Medicine

## 2008-07-17 LAB — CONVERTED CEMR LAB: Cortisol - AM: 0.6 ug/dL — ABNORMAL LOW (ref 4.3–22.4)

## 2008-08-15 ENCOUNTER — Ambulatory Visit: Payer: Self-pay | Admitting: Family Medicine

## 2008-08-15 ENCOUNTER — Telehealth: Payer: Self-pay | Admitting: Family Medicine

## 2008-08-15 DIAGNOSIS — K5909 Other constipation: Secondary | ICD-10-CM | POA: Insufficient documentation

## 2008-08-20 ENCOUNTER — Encounter: Payer: Self-pay | Admitting: Family Medicine

## 2008-08-20 ENCOUNTER — Telehealth: Payer: Self-pay | Admitting: Family Medicine

## 2008-09-26 ENCOUNTER — Encounter: Payer: Self-pay | Admitting: Family Medicine

## 2008-09-29 ENCOUNTER — Ambulatory Visit: Payer: Self-pay | Admitting: Family Medicine

## 2008-10-20 ENCOUNTER — Telehealth: Payer: Self-pay | Admitting: Family Medicine

## 2008-12-02 ENCOUNTER — Encounter: Payer: Self-pay | Admitting: Family Medicine

## 2008-12-10 ENCOUNTER — Encounter: Admission: RE | Admit: 2008-12-10 | Discharge: 2008-12-10 | Payer: Self-pay | Admitting: Family Medicine

## 2008-12-15 ENCOUNTER — Ambulatory Visit: Payer: Self-pay | Admitting: Family Medicine

## 2008-12-16 LAB — CONVERTED CEMR LAB
BUN: 16 mg/dL (ref 6–23)
CO2: 23 meq/L (ref 19–32)
Calcium: 9.9 mg/dL (ref 8.4–10.5)
Chloride: 103 meq/L (ref 96–112)
Cholesterol: 213 mg/dL — ABNORMAL HIGH (ref 0–200)
Creatinine, Ser: 0.89 mg/dL (ref 0.40–1.20)
Glucose, Bld: 100 mg/dL — ABNORMAL HIGH (ref 70–99)
HDL: 44 mg/dL (ref >39)
Hgb A1c MFr Bld: 5.7 % (ref 4.6–6.1)
LDL Cholesterol: 129 mg/dL — ABNORMAL HIGH (ref 0–99)
Potassium: 4.3 meq/L (ref 3.5–5.3)
Sodium: 139 meq/L (ref 135–145)
Total CHOL/HDL Ratio: 4.8
Triglycerides: 202 mg/dL — ABNORMAL HIGH (ref <150)
VLDL: 40 mg/dL (ref 0–40)
Vit D, 25-Hydroxy: 33 ng/mL (ref 30–89)

## 2009-01-16 ENCOUNTER — Encounter: Admission: RE | Admit: 2009-01-16 | Discharge: 2009-01-16 | Payer: Self-pay | Admitting: Family Medicine

## 2009-01-16 ENCOUNTER — Ambulatory Visit: Payer: Self-pay | Admitting: Family Medicine

## 2009-01-18 LAB — CONVERTED CEMR LAB
Basophils Absolute: 0 10*3/uL (ref 0.0–0.1)
Basophils Relative: 0 % (ref 0–1)
Eosinophils Absolute: 0.1 10*3/uL (ref 0.0–0.7)
Eosinophils Relative: 2 % (ref 0–5)
HCT: 41.9 % (ref 36.0–46.0)
Hemoglobin: 13.4 g/dL (ref 12.0–15.0)
Lymphocytes Relative: 38 % (ref 12–46)
Lymphs Abs: 2.9 10*3/uL (ref 0.7–4.0)
MCHC: 32 g/dL (ref 30.0–36.0)
MCV: 85.2 fL (ref 78.0–100.0)
Monocytes Absolute: 0.6 10*3/uL (ref 0.1–1.0)
Monocytes Relative: 8 % (ref 3–12)
Neutro Abs: 4 10*3/uL (ref 1.7–7.7)
Neutrophils Relative %: 52 % (ref 43–77)
Platelets: 266 10*3/uL (ref 150–400)
RBC: 4.92 M/uL (ref 3.87–5.11)
RDW: 14 % (ref 11.5–15.5)
Sed Rate: 12 mm/hr (ref 0–22)
Uric Acid, Serum: 5 mg/dL (ref 2.4–7.0)
WBC: 7.7 10*3/uL (ref 4.0–10.5)

## 2009-01-27 ENCOUNTER — Telehealth: Payer: Self-pay | Admitting: Family Medicine

## 2009-02-07 HISTORY — PX: RECTOCELE REPAIR: SHX761

## 2009-02-10 ENCOUNTER — Ambulatory Visit: Payer: Self-pay | Admitting: Family Medicine

## 2009-03-16 ENCOUNTER — Ambulatory Visit: Payer: Self-pay | Admitting: Family Medicine

## 2009-03-30 ENCOUNTER — Telehealth: Payer: Self-pay | Admitting: Family Medicine

## 2009-04-14 ENCOUNTER — Ambulatory Visit: Payer: Self-pay | Admitting: Family Medicine

## 2009-04-14 LAB — CONVERTED CEMR LAB: Hgb A1c MFr Bld: 6 %

## 2009-04-30 ENCOUNTER — Encounter: Payer: Self-pay | Admitting: Family Medicine

## 2009-08-21 ENCOUNTER — Encounter: Payer: Self-pay | Admitting: Family Medicine

## 2009-09-07 ENCOUNTER — Telehealth: Payer: Self-pay | Admitting: Family Medicine

## 2009-09-07 ENCOUNTER — Encounter: Payer: Self-pay | Admitting: Family Medicine

## 2009-10-14 ENCOUNTER — Encounter: Payer: Self-pay | Admitting: Family Medicine

## 2009-11-27 ENCOUNTER — Ambulatory Visit: Payer: Self-pay | Admitting: Family Medicine

## 2009-11-27 DIAGNOSIS — M949 Disorder of cartilage, unspecified: Secondary | ICD-10-CM

## 2009-11-27 DIAGNOSIS — M899 Disorder of bone, unspecified: Secondary | ICD-10-CM | POA: Insufficient documentation

## 2009-11-27 LAB — CONVERTED CEMR LAB: Hgb A1c MFr Bld: 5.7 %

## 2009-11-28 ENCOUNTER — Encounter: Payer: Self-pay | Admitting: Family Medicine

## 2009-12-03 ENCOUNTER — Encounter: Payer: Self-pay | Admitting: Family Medicine

## 2009-12-03 LAB — CONVERTED CEMR LAB
ALT: 19 units/L (ref 0–35)
AST: 25 units/L (ref 0–37)
Albumin: 3.9 g/dL (ref 3.5–5.2)
Alkaline Phosphatase: 82 units/L (ref 39–117)
BUN: 9 mg/dL (ref 6–23)
CO2: 26 meq/L (ref 19–32)
Calcium: 9.3 mg/dL (ref 8.4–10.5)
Chloride: 106 meq/L (ref 96–112)
Cholesterol: 161 mg/dL (ref 0–200)
Creatinine, Ser: 0.88 mg/dL (ref 0.40–1.20)
Glucose, Bld: 97 mg/dL (ref 70–99)
HDL: 41 mg/dL (ref >39)
LDL Cholesterol: 92 mg/dL (ref 0–99)
Potassium: 4.2 meq/L (ref 3.5–5.3)
Sodium: 139 meq/L (ref 135–145)
Total Bilirubin: 0.3 mg/dL (ref 0.3–1.2)
Total CHOL/HDL Ratio: 3.9
Total Protein: 6.2 g/dL (ref 6.0–8.3)
Triglycerides: 141 mg/dL (ref <150)
VLDL: 28 mg/dL (ref 0–40)

## 2009-12-14 ENCOUNTER — Ambulatory Visit (HOSPITAL_COMMUNITY): Admission: RE | Admit: 2009-12-14 | Discharge: 2009-12-14 | Payer: Self-pay | Admitting: Family Medicine

## 2009-12-18 ENCOUNTER — Telehealth: Payer: Self-pay | Admitting: Family Medicine

## 2009-12-21 ENCOUNTER — Telehealth: Payer: Self-pay | Admitting: Family Medicine

## 2009-12-22 ENCOUNTER — Encounter: Admission: RE | Admit: 2009-12-22 | Discharge: 2009-12-22 | Payer: Self-pay | Admitting: Family Medicine

## 2010-02-07 DIAGNOSIS — K759 Inflammatory liver disease, unspecified: Secondary | ICD-10-CM

## 2010-02-07 HISTORY — DX: Inflammatory liver disease, unspecified: K75.9

## 2010-02-11 ENCOUNTER — Encounter: Payer: Self-pay | Admitting: Family Medicine

## 2010-02-11 ENCOUNTER — Ambulatory Visit
Admission: RE | Admit: 2010-02-11 | Discharge: 2010-02-11 | Payer: Self-pay | Source: Home / Self Care | Attending: Family Medicine | Admitting: Family Medicine

## 2010-02-11 DIAGNOSIS — R002 Palpitations: Secondary | ICD-10-CM | POA: Insufficient documentation

## 2010-02-12 ENCOUNTER — Encounter: Payer: Self-pay | Admitting: Family Medicine

## 2010-02-12 LAB — CONVERTED CEMR LAB
ALT: 13 units/L (ref 0–35)
AST: 19 units/L (ref 0–37)
Albumin: 4.2 g/dL (ref 3.5–5.2)
Alkaline Phosphatase: 76 units/L (ref 39–117)
BUN: 13 mg/dL (ref 6–23)
CO2: 28 meq/L (ref 19–32)
Calcium: 9.3 mg/dL (ref 8.4–10.5)
Chloride: 104 meq/L (ref 96–112)
Creatinine, Ser: 1.16 mg/dL (ref 0.40–1.20)
Glucose, Bld: 83 mg/dL (ref 70–99)
HCT: 39.7 % (ref 36.0–46.0)
Hemoglobin: 12.6 g/dL (ref 12.0–15.0)
MCHC: 31.7 g/dL (ref 30.0–36.0)
MCV: 85.4 fL (ref 78.0–100.0)
Platelets: 249 10*3/uL (ref 150–400)
Potassium: 4.5 meq/L (ref 3.5–5.3)
RBC: 4.65 M/uL (ref 3.87–5.11)
RDW: 14.1 % (ref 11.5–15.5)
Sodium: 144 meq/L (ref 135–145)
TSH: 2.841 microintl units/mL (ref 0.350–4.500)
Total Bilirubin: 0.3 mg/dL (ref 0.3–1.2)
Total Protein: 6.8 g/dL (ref 6.0–8.3)
WBC: 6.2 10*3/uL (ref 4.0–10.5)

## 2010-02-19 ENCOUNTER — Encounter: Payer: Self-pay | Admitting: Family Medicine

## 2010-02-19 LAB — CONVERTED CEMR LAB
Bacteria, UA: NONE SEEN
Bilirubin Urine: NEGATIVE
Blood, UA: NEGATIVE
Casts: NONE SEEN /lpf
Creatinine, Ser: 0.83 mg/dL (ref 0.40–1.20)
Creatinine, Urine: 80 mg/dL
Crystals: NONE SEEN
Ketones, ur: NEGATIVE mg/dL
Microalb Creat Ratio: 6.3 mg/g (ref 0.0–30.0)
Microalb, Ur: 0.5 mg/dL (ref 0.00–1.89)
Nitrite: NEGATIVE
Protein, ur: NEGATIVE mg/dL
Specific Gravity, Urine: 1.01 (ref 1.005–1.030)
Urine Glucose: NEGATIVE mg/dL
Urobilinogen, UA: 0.2 (ref 0.0–1.0)
pH: 6 (ref 5.0–8.0)

## 2010-02-22 ENCOUNTER — Ambulatory Visit
Admission: RE | Admit: 2010-02-22 | Discharge: 2010-02-22 | Payer: Self-pay | Source: Home / Self Care | Attending: Family Medicine | Admitting: Family Medicine

## 2010-02-22 LAB — CONVERTED CEMR LAB: Rapid Strep: NEGATIVE

## 2010-02-23 ENCOUNTER — Encounter: Payer: Self-pay | Admitting: Family Medicine

## 2010-02-28 ENCOUNTER — Encounter: Payer: Self-pay | Admitting: Family Medicine

## 2010-03-02 ENCOUNTER — Ambulatory Visit: Admission: RE | Admit: 2010-03-02 | Discharge: 2010-03-02 | Payer: Self-pay | Source: Home / Self Care

## 2010-03-05 ENCOUNTER — Ambulatory Visit
Admission: RE | Admit: 2010-03-05 | Discharge: 2010-03-05 | Payer: Self-pay | Source: Home / Self Care | Attending: Family Medicine | Admitting: Family Medicine

## 2010-03-05 DIAGNOSIS — J019 Acute sinusitis, unspecified: Secondary | ICD-10-CM | POA: Insufficient documentation

## 2010-03-05 DIAGNOSIS — J0100 Acute maxillary sinusitis, unspecified: Secondary | ICD-10-CM | POA: Insufficient documentation

## 2010-03-07 LAB — CONVERTED CEMR LAB
ALT: 19 units/L (ref 0–35)
ALT: 26 units/L (ref 0–35)
AST: 22 units/L (ref 0–37)
AST: 24 units/L (ref 0–37)
Albumin: 4.5 g/dL (ref 3.5–5.2)
Albumin: 4.7 g/dL (ref 3.5–5.2)
Alkaline Phosphatase: 73 units/L (ref 39–117)
Alkaline Phosphatase: 91 units/L (ref 39–117)
BUN: 12 mg/dL (ref 6–23)
BUN: 16 mg/dL (ref 6–23)
Bilirubin Urine: NEGATIVE
CO2: 24 meq/L (ref 19–32)
CO2: 26 meq/L (ref 19–32)
Calcium: 9.9 mg/dL (ref 8.4–10.5)
Calcium: 9.9 mg/dL (ref 8.4–10.5)
Chloride: 103 meq/L (ref 96–112)
Chloride: 105 meq/L (ref 96–112)
Creatinine, Ser: 0.86 mg/dL (ref 0.40–1.20)
Creatinine, Ser: 0.87 mg/dL (ref 0.40–1.20)
FSH: 75.2 milliintl units/mL
Glucose, Bld: 86 mg/dL (ref 70–99)
Glucose, Bld: 95 mg/dL
Glucose, Bld: 98 mg/dL (ref 70–99)
Glucose, Urine, Semiquant: NEGATIVE
HCT: 40.4 % (ref 36.0–46.0)
HCT: 44.4 % (ref 36.0–46.0)
Hemoglobin: 13.2 g/dL (ref 12.0–15.0)
Hemoglobin: 14.3 g/dL (ref 12.0–15.0)
Ketones, urine, test strip: NEGATIVE
LH: 40.5 milliintl units/mL
MCHC: 32.2 g/dL (ref 30.0–36.0)
MCHC: 32.7 g/dL (ref 30.0–36.0)
MCV: 86.7 fL (ref 78.0–100.0)
MCV: 86.7 fL (ref 78.0–100.0)
Nitrite: NEGATIVE
Platelets: 218 10*3/uL (ref 150–400)
Platelets: 249 10*3/uL (ref 150–400)
Potassium: 4.5 meq/L (ref 3.5–5.3)
Potassium: 4.8 meq/L (ref 3.5–5.3)
Pro B Natriuretic peptide (BNP): 30 pg/mL (ref 0.0–100.0)
Progesterone: 0.4 ng/mL
Protein, U semiquant: NEGATIVE
RBC: 4.66 M/uL (ref 3.87–5.11)
RBC: 5.12 M/uL — ABNORMAL HIGH (ref 3.87–5.11)
RDW: 13.6 % (ref 11.5–15.5)
RDW: 13.7 % (ref 11.5–15.5)
Sed Rate: 9 mm/hr (ref 0–22)
Sodium: 140 meq/L (ref 135–145)
Sodium: 144 meq/L (ref 135–145)
Specific Gravity, Urine: 1.01
TSH: 1.462 microintl units/mL (ref 0.350–4.50)
TSH: 2.622 microintl units/mL (ref 0.350–5.50)
Total Bilirubin: 0.3 mg/dL (ref 0.3–1.2)
Total Bilirubin: 0.4 mg/dL (ref 0.3–1.2)
Total Protein: 7 g/dL (ref 6.0–8.3)
Total Protein: 7.6 g/dL (ref 6.0–8.3)
Urobilinogen, UA: 0.2
WBC: 6.3 10*3/uL (ref 4.0–10.5)
WBC: 6.6 10*3/uL (ref 4.0–10.5)
pH: 7

## 2010-03-09 ENCOUNTER — Encounter: Payer: Self-pay | Admitting: Family Medicine

## 2010-03-09 NOTE — Assessment & Plan Note (Signed)
Summary: Throat sore, Cough, RCC left   Vital Signs:  Patient Profile:   70 Years Old Female Height:     64.5 inches Weight:      189 pounds Temp:     97.0 degrees F oral Pulse rate:   77 / minute BP sitting:   100 / 56  (left arm) Cuff size:   regular  Vitals Entered By: Kathlene November (June 04, 2007 9:58 AM)                 PCP:  Cipriano Bunker  Chief Complaint:  ? swelling left side of throat and cough for months. ? cancer.  History of Present Illness: Has had low grade cough for couple of months.  Son recently diagnosed with lung cancer and she is very worried.  Has alos noticed an area in the back of her throat that is painful with swallowing. She has an area on the left posterior pharynth that she had looked at by ENT about 10 yeas ago and was told it was left over from her tonsillectomy.  Now she sees a new spot that has been bothering her for the last month. No swelling of the neck or throat.    Left shoulder pain for over one year.  Unable to lift greater than 90 degree. Now having a hard time washing and drying her hair.  NO meds currently. Aching in teh back of her shoulder adn occassionally radiates into the deltoid down to her elbow.     Current Allergies: ! CODEINE SULFATE (CODEINE SULFATE) ! SULFA ! PROZAC  Past Medical History:    Reviewed history from 08/25/2006 and no changes required:       3 bulging disks, mild spinal stenosis,  L4 affected       Hx of Diverticuli, colon,        Lung nodules, calcified granulomas, chronic       ABIs were normal at screen done on 03/31/06  Past Surgical History:    Reviewed history from 11/24/2006 and no changes required:       Appendectomy  1960s,        EGD/colonoscopy-normal in2002,        Hysterectomy 1973   Family History:    Alcoholism-Father, Brother    Mother had RA, kidney failure    Father had psoriasis    Son with Lung Ca, HIV  Social History:    Reviewed history from 01/15/2007 and no changes  required:       12 yrs education. Married to [place] who is incarcerated.  4 adult chldren.  Quit smoking 1995, no drugs, no EtOH, 3 caffeinated drinks per day, Stretches daily and goes for walks     Physical Exam  General:     Well-developed,well-nourished,in no acute distress; alert,appropriate and cooperative throughout examination Head:     Normocephalic and atraumatic without obvious abnormalities. No apparent alopecia or balding. Eyes:     No corneal or conjunctival inflammation noted. EOMI. Perrla.  Ears:     External ear exam shows no significant lesions or deformities.  Otoscopic examination reveals clear canals, tympanic membranes are intact bilaterally without bulging, retraction, inflammation or discharge. Hearing is grossly normal bilaterally. Nose:     External nasal examination shows no deformity or inflammation.  Mouth:     OP appears normal with a small whitish area along left latera edge near the folds.  No drainage.   Neck:     No deformities, masses, or tenderness  noted. Lungs:     Normal respiratory effort, chest expands symmetrically. Lungs are clear to auscultation, no crackles or wheezes. Heart:     Normal rate and regular rhythm. S1 and S2 normal without gallop, murmur, click, rub or other extra sounds. Msk:     Forward flexion of left arm to 120 dgrees, abduction to 90 degress. Able to touch her low back but cannot internally rotate any further. Unable to do lift off with any real strength.  + empty can test on teh left.  Pulses:     Radial 2+  Skin:     no rashes.   Cervical Nodes:     No lymphadenopathy noted Psych:     Cognition and judgment appear intact. Alert and cooperative with normal attention span and concentration. No apparent delusions, illusions, hallucinations    Impression & Recommendations:  Problem # 1:  COUGH (ICD-786.2) Unclear etiology.  Will start with CXr. Pulse ox was normal.  If CXR is normal  will set up for spirometry.  Already  on PPI.  ENT may be able to evaluate her vocal cords to see if reflux may be an issue.   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 Also worry about her getting frozen shoulder syndrome. Recommend MRI for further eval when she has teh time.  Her son is getting ready to start chemo next week. In meantime went over some home exercises. 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  Unclear etiology. The pain is somewhat worrisome but I see no evidence of infection.  Orders: ENT Referral (ENT)   Complete Medication List: 1)  Aspir-low 81 Mg Tbec (Aspirin) .... Take one tablet daily 2)  Calcium-vitamin D 250-125 Mg-unit Tabs (Calcium carbonate-vitamin d) .... Take one twice a day 3)  Tramadol Hcl 50 Mg Tabs (Tramadol hcl) .... One by mouth every 6 hours as needed pain 4)  Zocor 20 Mg Tabs (Simvastatin) .... Take one tablet by mouth daily 5)  Xanax 0.5 Mg Tabs (Alprazolam) .... Take one tablet by mouth at bedtime as needed 6)  Prevacid 30 Mg Cpdr (Lansoprazole) .... Take one by mouth daily 7)  Paxil 20 Mg Tabs (Paroxetine hcl) .... Take one by mouth daily 8)  Multivitamins Caps (Multiple vitamin) .... Take one by mouth daily 9)  Flunisolide 29 Mcg/act Soln (Flunisolide) .... Two sprays/nostril daily 10)  Trazodone Hcl 100 Mg Tabs (Trazodone hcl) .... Take 1 tablet by mouth once a day 11)  Cinnamon 500 Mg Caps (Cinnamon) .... Two times a day 12)  Bl Flax Seed Oil 1000 Mg Caps (Flaxseed (linseed)) .... Take one tablet by mouth three times a day 13)  Piroxicam 20 Mg Caps (Piroxicam) .... Take 1 tablet by mouth once a day as needed joint pain     ]

## 2010-03-09 NOTE — Progress Notes (Signed)
Summary: Problems with throid Rx  Phone Note Call from Patient   Summary of Call: Pt LMOM stating she is still unable to tolerate thyroid med. Pt states it has been great for energy and has been decreasing the amount of aches and pains she has had but she has been itchy, irritable and experiencing heart palps at times. Please advise. Initial call taken by: Payton Spark CMA,  Jun 24, 2008 11:22 AM  Follow-up for Phone Call        OK, then stop the thyroid supplement for one week and let me know if irritability adn itching gets better.  Follow-up by: Nani Gasser MD,  Jun 24, 2008 11:26 AM  Additional Follow-up for Phone Call Additional follow up Details #1::        [facility] informing Pt of the above.  Additional Follow-up by: Payton Spark CMA,  Jun 24, 2008 1:04 PM

## 2010-03-09 NOTE — Progress Notes (Signed)
Summary: REFERRAL  Phone Note Call from Patient   Reason for Call: Referral Summary of Call: PATIENT CALLED REQUESTING A REFERRAL TO SEE AN OPTHAMOLOGIST IN [place] OR [place]. Initial call taken by: Drinda Butts,  March 15, 2006 4:31 PM  Follow-up for Phone Call        Would recommend Dr. Hazle Quant in [place]. Pt should be able to call and make her own appt, or we can make a referall for her.   Follow-up by: Nani Gasser MD,  March 16, 2006 8:35 AM  Additional Follow-up for Phone Call Additional follow up Details #1::        [facility] for patient to call office.03/17/06@10 :25amLM  Patient informed and given Dr.Digby's name and patient said she would call and schedule the appointment herself. Additional Follow-up by: Harlene Salts,  March 16, 2006 11:00 AM

## 2010-03-09 NOTE — Assessment & Plan Note (Signed)
Summary: flu like illness   Vital Signs:  Patient profile:   70 year old female Height:      64.5 inches Weight:      197 pounds Temp:     99.3 degrees F oral Pulse rate:   112 / minute BP sitting:   124 / 65  (left arm) Cuff size:   regular  Vitals Entered By: Kathlene November (February 10, 2009 2:13 PM) CC: sore throat, dry cough, bodyaches, fever last night of 101.0. Started Saturday morning   Primary Care Provider:  Alden Hipp, MD  CC:  sore throat, dry cough, bodyaches, and fever last night of 101.0. Started Saturday morning.  History of Present Illness: sore throat, dry cough, bodyaches, fever last night of 101.0. Started Saturday morning. FEver started last night. Chest is hurting from coughing. Taking Honey adn lemon juice.  Using tylenol for fever and aches. No nasal congetestin. Fatigue.   Current Medications (verified): 1)  Aspir-Low 81 Mg Tbec (Aspirin) .... Take One Tablet Daily 2)  Calcium-Vitamin D 250-125 Mg-Unit Tabs (Calcium Carbonate-Vitamin D) .... Take One Twice A Day 3)  Zocor 20 Mg Tabs (Simvastatin) .... Take One Tablet By Mouth Daily 4)  Xanax 0.5 Mg Tabs (Alprazolam) .... Take 1 Tablet By Mouth Two Times A Day As Needed 5)  Multivitamins  Caps (Multiple Vitamin) .... Take One By Mouth Daily 6)  Bl Flax Seed Oil 1000 Mg  Caps (Flaxseed (Linseed)) .... Take One Tablet By Mouth Three Times A Day 7)  Miralax   Powd (Polyethylene Glycol 3350) .... One Capful Once Daily As Needed Constipation 8)  Stool Softener 100 Mg Caps (Docusate Sodium) .Marland Kitchen.. 1 By Mouth Qlunchtime and 2 By Mouth At Bedtime 9)  Vitamin D3 1000 Unit Tabs (Cholecalciferol) .Marland Kitchen.. 1 By Mouth Once Daily 10)  Zolpidem Tartrate 5 Mg Tabs (Zolpidem Tartrate) .... Take 1 Tablet By Mouth Once A Day At Bedtime 11)  Paroxetine Hcl 20 Mg Tabs (Paroxetine Hcl) .... Take 1 Tablet By Mouth Once A Day 12)  Trazodone Hcl 50 Mg Tabs (Trazodone Hcl) .... 1/2-1 Tab By Mouth Once A Day At Bedtime. 13)  Tramadol  Hcl 50 Mg Tabs (Tramadol Hcl) .... Take 1 Tablet By Mouth Three Times A Day As Needed 14)  Omeprazole 20 Mg Cpdr (Omeprazole) .... Take One Capsule By Mouth Twice A Day  Allergies (verified): 1)  ! Codeine Sulfate (Codeine Sulfate) 2)  ! Sulfa 3)  ! Prozac  Comments:  Nurse/Medical Assistant: The patient's medications and allergies were reviewed with the patient and were updated in the Medication and Allergy Lists. Kathlene November (February 10, 2009 2:14 PM)  Physical Exam  General:  Well-developed,well-nourished,in no acute distress; alert,appropriate and cooperative throughout examination Head:  Normocephalic and atraumatic without obvious abnormalities. No apparent alopecia or balding. Eyes:  No corneal or conjunctival inflammation noted. EOMI. Perrla. Ears:  External ear exam shows no significant lesions or deformities.  Otoscopic examination reveals clear canals, tympanic membranes are intact bilaterally without bulging, retraction, inflammation or discharge. Hearing is grossly normal bilaterally. Nose:  External nasal examination shows no deformity or inflammation. Nasal mucosa are pink and moist without lesions or exudates. Mouth:  OP with mild erythema, clear drainage.  Neck:  No deformities, masses, or tenderness noted. Lungs:  Normal respiratory effort, chest expands symmetrically. Lungs are clear to auscultation, no crackles or wheezes. Heart:  Normal rate and regular rhythm. S1 and S2 normal without gallop, murmur, click, rub or other extra  sounds. Skin:  no rashes.   Psych:  Cognition and judgment appear intact. Alert and cooperative with normal attention span and concentration. No apparent delusions, illusions, hallucinations   Impression & Recommendations:  Problem # 1:  INFLUENZA LIKE ILLNESS (ICD-487.1)  Rest, increase fluids, use Tylenol [phone] mg every 4-6 hours, and avoid contact with others. call office if no improvement in 5-7 days or if symptoms worsen.    Complete Medication List: 1)  Aspir-low 81 Mg Tbec (Aspirin) .... Take one tablet daily 2)  Calcium-vitamin D 250-125 Mg-unit Tabs (Calcium carbonate-vitamin d) .... Take one twice a day 3)  Zocor 20 Mg Tabs (Simvastatin) .... Take one tablet by mouth daily 4)  Xanax 0.5 Mg Tabs (Alprazolam) .... Take 1 tablet by mouth two times a day as needed 5)  Multivitamins Caps (Multiple vitamin) .... Take one by mouth daily 6)  Bl Flax Seed Oil 1000 Mg Caps (Flaxseed (linseed)) .... Take one tablet by mouth three times a day 7)  Miralax Powd (Polyethylene glycol 3350) .... One capful once daily as needed constipation 8)  Stool Softener 100 Mg Caps (Docusate sodium) .Marland Kitchen.. 1 by mouth qlunchtime and 2 by mouth at bedtime 9)  Vitamin D3 1000 Unit Tabs (Cholecalciferol) .Marland Kitchen.. 1 by mouth once daily 10)  Zolpidem Tartrate 5 Mg Tabs (Zolpidem tartrate) .... Take 1 tablet by mouth once a day at bedtime 11)  Paroxetine Hcl 20 Mg Tabs (Paroxetine hcl) .... Take 1 tablet by mouth once a day 12)  Trazodone Hcl 50 Mg Tabs (Trazodone hcl) .... 1/2-1 tab by mouth once a day at bedtime. 13)  Tramadol Hcl 50 Mg Tabs (Tramadol hcl) .... Take 1 tablet by mouth three times a day as needed 14)  Omeprazole 20 Mg Cpdr (Omeprazole) .... Take one capsule by mouth twice a day 15)  Hydrocodone-homatropine 5-1.5 Mg/68ml Syrp (Hydrocodone-homatropine) .... 5ml by mouth at bedtime as needed cough Prescriptions: HYDROCODONE-HOMATROPINE 5-1.5 MG/5ML SYRP (HYDROCODONE-HOMATROPINE) 5ml by mouth at bedtime as needed cough  #197ml x 0   Entered and Authorized by:   Nani Gasser MD   Signed by:   Nani Gasser MD on 02/10/2009   Method used:   Printed then faxed to ...       [address] [phone]* (retail)       [address] [place], [place]  [address]       Ph: [phone]       Fax: [phone]   RxID:   [id number]

## 2010-03-09 NOTE — Progress Notes (Signed)
  Phone Note Outgoing Call Call back at [facility] Phone [phone] Call placed by: Nani Gasser MD,  January 04, 2006 1:19 PM Call placed to: Patient Reason for Call: Discuss lab or test results Details for Reason: Discussed path results. Pt understood.  CM

## 2010-03-09 NOTE — Assessment & Plan Note (Signed)
Summary: FLU SHOT  Nurse Visit   Vitals Entered By: Kathlene November (November 12, 2007 11:37 AM)                 Prior Medications: ASPIR-LOW 81 MG TBEC (ASPIRIN) Take one tablet daily CALCIUM-VITAMIN D 250-125 MG-UNIT TABS (CALCIUM CARBONATE-VITAMIN D) Take one twice a day TRAMADOL HCL 50 MG  TABS (TRAMADOL HCL) one by mouth every 6 hours as needed pain ZOCOR 20 MG TABS (SIMVASTATIN) Take one tablet by mouth daily XANAX 0.5 MG TABS (ALPRAZOLAM) Take 1 tablet by mouth two times a day as needed PREVACID 30 MG CPDR (LANSOPRAZOLE) Take one by mouth daily PAXIL 20 MG TABS (PAROXETINE HCL) Take one by mouth daily MULTIVITAMINS  CAPS (MULTIPLE VITAMIN) Take one by mouth daily FLUNISOLIDE 29 MCG/ACT SOLN (FLUNISOLIDE) two sprays/nostril daily TRAZODONE HCL 100 MG TABS (TRAZODONE HCL) Take 1 tablet by mouth once a day CINNAMON 500 MG CAPS (CINNAMON) two times a day BL FLAX SEED OIL 1000 MG  CAPS (FLAXSEED (LINSEED)) Take one tablet by mouth three times a day PIROXICAM 20 MG  CAPS (PIROXICAM) Take 1 tablet by mouth once a day as needed joint pain PROAIR HFA 108 (90 BASE) MCG/ACT  AERS (ALBUTEROL SULFATE) 2-4 puffs inhaled every 4-6 hours as needed cough or shortness of breath Current Allergies: ! CODEINE SULFATE (CODEINE SULFATE) ! SULFA ! PROZAC   Influenza Vaccine    Vaccine Type: Fluvax 3+    Site: right deltoid    Mfr: [employer]Smith[employer]    Dose: 0.5 ml    Route: IM    Given by: Kathlene November    Exp. Date: 08/06/2008    Lot #: [id number]    VIS given: 08/31/06 version given November 12, 2007.  Flu Vaccine Consent Questions    Do you have a history of severe allergic reactions to this vaccine? no    Any prior history of allergic reactions to egg and/or gelatin? no    Do you have a sensitivity to the preservative Thimersol? no    Do you have a past history of Guillan-Barre Syndrome? no    Do you currently have an acute febrile illness? no    Have you ever had a severe reaction to  latex? no    Vaccine information given and explained to patient? yes    Are you currently pregnant? no   Orders Added: 1)  Flu Vaccine 47yrs + [90658] 2)  Admin 1st Vaccine [email or web address]    ]

## 2010-03-09 NOTE — Miscellaneous (Signed)
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

## 2010-03-09 NOTE — Progress Notes (Signed)
  Phone Note Call from Patient   Caller: Patient Call For: Nani Gasser MD Summary of Call: pt called and states she wants her rx for tramadol 50mg  increased to 100mg  because the lower does is not helping Initial call taken by: Avon Gully CMA, Duncan Dull),  December 18, 2009 3:03 PM  Follow-up for Phone Call        Can try chaing to 100mg  XR once a day.  Follow-up by: Nani Gasser MD,  December 18, 2009 3:50 PM  Additional Follow-up for Phone Call Additional follow up Details #1::        pt notified Additional Follow-up by: Avon Gully CMA, Duncan Dull),  December 18, 2009 4:38 PM    New/Updated Medications: TRAMADOL HCL 100 MG XR24H-TAB (TRAMADOL HCL) Take 1 tablet by mouth once a day Prescriptions: TRAMADOL HCL 100 MG XR24H-TAB (TRAMADOL HCL) Take 1 tablet by mouth once a day  #30 x 1   Entered and Authorized by:   Nani Gasser MD   Signed by:   Nani Gasser MD on 12/18/2009   Method used:   Electronically to        [facility] [phone]* (retail)       [address] [place], [place]  [address]       Ph: [phone]       Fax: [phone]   RxID:   [id number]

## 2010-03-09 NOTE — Miscellaneous (Signed)
Summary: Initial Summary/[facility]  Initial Summary/[facility]   Imported By: Lanelle Bal 05/05/2008 13:15:44  _____________________________________________________________________  External Attachment:    Type:   Image     Comment:   External Document

## 2010-03-09 NOTE — Assessment & Plan Note (Signed)
Summary: URI   Vital Signs:  Patient Profile:   70 Years Old Female Height:     64.5 inches Weight:      200 pounds Temp:     98.7 degrees F oral Pulse rate:   76 / minute BP sitting:   108 / 56  (left arm) Cuff size:   regular  Vitals Entered By: Kathlene November (February 28, 2008 3:45 PM)                 PCP:  Alden Hipp, MD  Chief Complaint:  a little nausea, sore throat, drainage, yellow phlegm, cough, H/A, and sinus pressure. Sx's since yesterday morning. Also pt says is weaning herself off the Seroquel- does not want to take anymore.  History of Present Illness: a little nausea, sore throat, drainage, yellow phlegm, cough, H/A, sinus pressure. Sx's since yesterday morning.  No cough at night. No fever.  Taking lemon, honey, cayeene for her throat.  Mild ST. Noticed a spot on her tonsil. Mild nausea. Severe HA>  No OTC meds.   Also pt says is weaning herself off the Seroquel- does not want to take anymore.  Says she feels more depressed.  Sleep is back to normal.     Current Allergies: ! CODEINE SULFATE (CODEINE SULFATE) ! SULFA ! PROZAC      Physical Exam  General:     Well-developed,well-nourished,in no acute distress; alert,appropriate and cooperative throughout examination Head:     Normocephalic and atraumatic without obvious abnormalities. No apparent alopecia or balding. Eyes:     No corneal or conjunctival inflammation noted. EOMI. Perrla.  Ears:     External ear exam shows no significant lesions or deformities.  Otoscopic examination reveals clear canals, tympanic membranes are intact bilaterally without bulging, retraction, inflammation or discharge. Hearing is grossly normal bilaterally. Nose:     External nasal examination shows no deformity or inflammation.  Mouth:     posterior pharynx is erythematous with a white lesion on the right. No tonsillar swelling.  Neck:     No deformities, masses, or tenderness noted. Lungs:     Normal  respiratory effort, chest expands symmetrically. Lungs are clear to auscultation, no crackles or wheezes. Heart:     Normal rate and regular rhythm. S1 and S2 normal without gallop, murmur, click, rub or other extra sounds. Skin:     no rashes.   Cervical Nodes:     mild anterior cerv LN.  Psych:     Cognition and judgment appear intact. Alert and cooperative with normal attention span and concentration. No apparent delusions, illusions, hallucinations    Impression & Recommendations:  Problem # 1:  URI (ICD-465.9) Call it not better next week. Nasal saline wash two times a day.  Her updated medication list for this problem includes:    Aspir-low 81 Mg Tbec (Aspirin) .Marland Kitchen... Take one tablet daily Instructed on symptomatic treatment. Call if symptoms persist or worsen.  Orders: Rapid Strep (54098)   Complete Medication List: 1)  Aspir-low 81 Mg Tbec (Aspirin) .... Take one tablet daily 2)  Calcium-vitamin D 250-125 Mg-unit Tabs (Calcium carbonate-vitamin d) .... Take one twice a day 3)  Zocor 20 Mg Tabs (Simvastatin) .... Take one tablet by mouth daily 4)  Xanax 0.5 Mg Tabs (Alprazolam) .... Take 1 tablet by mouth two times a day as needed 5)  Omeprazole 20 Mg Tbec (Omeprazole) .... Take 1 tablet by mouth once a day before breakfast. 6)  Paxil 40 Mg Tabs (  Paroxetine hcl) .... Take 1 tablet by mouth once a day 7)  Multivitamins Caps (Multiple vitamin) .... Take one by mouth daily 8)  Flunisolide 29 Mcg/act Soln (Flunisolide) .... Two sprays/nostril daily 9)  Trazodone Hcl 100 Mg Tabs (Trazodone hcl) .... Take 1 tablet by mouth once a day 10)  Bl Flax Seed Oil 1000 Mg Caps (Flaxseed (linseed)) .... Take one tablet by mouth three times a day 11)  Proair Hfa 108 (90 Base) Mcg/act Aers (Albuterol sulfate) .... 2-4 puffs inhaled every 4-6 hours as needed cough or shortness of breath 12)  Seroquel 25 Mg Tabs (Quetiapine fumarate) .... Take 1 tablet by mouth once a day 13)  Miralax Powd  (Polyethylene glycol 3350) .... One capful once daily 14)  Stool Softener 100 Mg Caps (Docusate sodium) .... As needed     Laboratory Results  Date/Time Received: 02/28/2008 Date/Time Reported: 02/28/2008  Other Tests  Rapid Strep: negative

## 2010-03-09 NOTE — Miscellaneous (Signed)
Summary: Flu Shot/[place]  Flu Shot/[place] [facility]   Imported By: Lanelle Bal 12/19/2008 13:19:51  _____________________________________________________________________  External Attachment:    Type:   Image     Comment:   External Document

## 2010-03-09 NOTE — Progress Notes (Signed)
Summary: Rx for Darvocet  Phone Note Call from Patient   Caller: Patient Summary of Call: Dr.Metheney    Call Back  [phone]  patient left a message on the machine.......she said she need a Refill on Darvocet 2 a day she said she has enough to last until Tuesday.   Initial call taken by: Vanessa [place],  June 06, 2008 1:02 PM  Follow-up for Phone Call        this was removed from her med list. Follow-up by: Seymour Bars DO,  June 06, 2008 2:01 PM      Appended Document: Rx for Darvocet Pt aware. Will have Pharm fax a request.

## 2010-03-09 NOTE — Progress Notes (Signed)
Summary: Not feeling any better  Phone Note Call from Patient Call back at Home Phone [phone]   Caller: Patient Call For: Nani Gasser MD Summary of Call: Pt states not feeling better signs's are drainage, sinus congestion, sore throat, cough pressure H/A still. Told to call today if not feeling any better. Uses [facility] in [place] Initial call taken by: Kathlene November,  March 07, 2008 8:51 AM  Follow-up for Phone Call        ABX sent. Call if not better. Follow-up by: Nani Gasser MD,  March 07, 2008 9:16 AM    New/Updated Medications: AMOXICILLIN 500 MG CAPS (AMOXICILLIN) Take 1 tablet by mouth three times a day for 10 days   Prescriptions: AMOXICILLIN 500 MG CAPS (AMOXICILLIN) Take 1 tablet by mouth three times a day for 10 days  #30 x 0   Entered and Authorized by:   Nani Gasser MD   Signed by:   Nani Gasser MD on 03/07/2008   Method used:   Electronically to        [facility] [phone]* (retail)       [address] [place], [place]  [address]       Ph: [phone]       Fax: [phone]   RxID:   [id number]

## 2010-03-09 NOTE — Progress Notes (Signed)
Summary: Allergist  Phone Note Call from Patient Call back at Home Phone [phone]   Caller: Patient Call For: Nani Gasser MD Summary of Call: Pt finished antibiotics that you gave her and she tyhinks its allergies and would like to know who you recommend for her to see Initial call taken by: Kathlene November,  March 30, 2009 1:55 PM  Follow-up for Phone Call        Can refer to [facility] for further evaluation of allergies and possible allergy testings.  Follow-up by: Nani Gasser MD,  March 30, 2009 2:05 PM

## 2010-03-09 NOTE — Progress Notes (Signed)
Summary: question about Pain mang appt  Phone Note Call from Patient   Caller: Patient Summary of Call: Dr.metheney  Call Back  [phone]  patient has an appt on Monday with the [facility] appt. and she said she think she needs to cancel the appt but she want to know if thats ok with Dr.Metheney. Initial call taken by: Vanessa Swaz[place],  July 08, 2008 10:08 AM  Follow-up for Phone Call        Just reschedule for future just incase need further help with her back.  Follow-up by: Nani Gasser MD,  July 08, 2008 11:53 AM  Additional Follow-up for Phone Call Additional follow up Details #1::        Pt notified of MD instructions. kJ LPN Additional Follow-up by: Kathlene November,  July 08, 2008 12:00 PM

## 2010-03-09 NOTE — Assessment & Plan Note (Signed)
Summary: Chronic Constipation, Lumbar Stenosis   Vital Signs:  Patient Profile:   70 Years Old Female Height:     64.5 inches Weight:      188 pounds Pulse rate:   76 / minute BP sitting:   106 / 58  (left arm) Cuff size:   regular  Vitals Entered By: Kathlene November (November 08, 2006 9:26 AM)                 PCP:  Cipriano Bunker  Chief Complaint:  on Miralax since last visit twice a day abdomen feels rigid and bloated and very gasy. Has a BM every other day and sometimes can be up to every 3 days. Has IBS with constipation.  History of Present Illness: TAking Colace three times a day and Miralax two times a day. Also taking fiber.  Usually takes EX-LAX after hasn't had BM for 3 days. Feels full and bloated. Drinks plenty of fluids and still stuggles.  Does have a hx of IBS.  Has been less active with her back pain.  Still having to strain some with BMs  Having more pain with sciatica. Says she is at the point where she is considering steroid injections or even back surgery.  Says would only see Dr. Lajoyce Corners in [place] if needed to see ortho. Has been doing her home.  Has thought about calling her chiropracter to see if she can be stretched  to help her back.   Current Allergies: ! CODEINE SULFATE (CODEINE SULFATE) ! SULFA ! PROZAC      Physical Exam  General:     Well-developed,well-nourished,in no acute distress; alert,appropriate and cooperative throughout examination Head:     Normocephalic and atraumatic without obvious abnormalities. No apparent alopecia or balding. Eyes:     No corneal or conjunctival inflammation noted. EOMI. Perrla.  Abdomen:     Bowel sounds positive,abdomen soft and without masses, organomegaly or hernias noted.  TTP deep in all quadrants.     Impression & Recommendations:  Problem # 1:  CONSTIPATION NOS (ICD-564.00) Add Sennakot every other day to regimen. Also gave her samples of AMitiza 24 micrograms  two times a day.  Also can try magnesium  citrate as needed when bowels are still not moving well.    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 She does find Darvocet helpful so refilled this med for her.   Complete Medication List: 1)  Aspir-low 81 Mg Tbec (Aspirin) .... Take one tablet daily 2)  Calcium-vitamin D 250-125 Mg-unit Tabs (Calcium carbonate-vitamin d) .... Take one twice a day 3)  Darvocet-n 100 Tabs (Propoxyphene n-apap tabs) .... Take tablet by mouth every 4-6 hours as needed pain 4)  Zocor 20 Mg Tabs (Simvastatin) .... Take one tablet by mouth daily 5)  Xanax 0.5 Mg Tabs (Alprazolam) .... Take one tablet by mouth at bedtime as needed 6)  Prevacid 30 Mg Cpdr (Lansoprazole) .... Take one by mouth daily 7)  Paxil 20 Mg Tabs (Paroxetine hcl) .... Take one by mouth daily 8)  Multivitamins Caps (Multiple vitamin) .... Take one by mouth daily 9)  Flunisolide 29 Mcg/act Soln (Flunisolide) .... Two sprays/nostril daily 10)  Trazodone Hcl 100 Mg Tabs (Trazodone hcl) .... Take 1/4 tablet at bedtime as needed 11)  Cinnamon 500 Mg Caps (Cinnamon) .... Two times a day 12)  Bl Flax Seed Oil 1000 Mg Caps (Flaxseed (linseed)) .... Take one tablet by  mouth three times a day     Prescriptions: DARVOCET-N 100  TABS (PROPOXYPHENE N-APAP TABS) Take tablet by mouth every 4-6 hours as needed pain  #45 x 2   Entered and Authorized by:   Nani Gasser MD   Signed by:   Nani Gasser MD on 11/08/2006   Method used:   Print then Give to Patient   RxID:   [id number]  ]

## 2010-03-09 NOTE — Assessment & Plan Note (Signed)
  Prescriptions: PAXIL 40 MG TABS (PAROXETINE HCL) Take one tablet by mouth once a day  #30 x 0   Entered and Authorized by:   Nani Gasser MD   Signed by:   Nani Gasser MD on 09/26/2008   Method used:   Electronically to        [facility] [phone]* (retail)       [address] [place], [place]  [address]       Ph: [phone]       Fax: [phone]   RxID:   [id number]

## 2010-03-09 NOTE — Progress Notes (Signed)
Summary: Change dose on Amitiza  Phone Note Call from Patient Call back at Home Phone [phone]   Caller: Patient Call For: Nani Gasser MD Summary of Call: Pt calls and would like the lower dose of Amitiza sent to her pharmacy Initial call taken by: Avon Gully CMA, Duncan Dull),  September 07, 2009 1:20 PM  Follow-up for Phone Call        New rx sent.  Follow-up by: Nani Gasser MD,  September 07, 2009 1:47 PM  Additional Follow-up for Phone Call Additional follow up Details #1::        pt notified Additional Follow-up by: Avon Gully CMA, Duncan Dull),  September 07, 2009 1:52 PM    New/Updated Medications: AMITIZA 8 MCG CAPS (LUBIPROSTONE) Take 1 tablet by mouth two times a day Prescriptions: AMITIZA 8 MCG CAPS (LUBIPROSTONE) Take 1 tablet by mouth two times a day  #60 x 6   Entered and Authorized by:   Nani Gasser MD   Signed by:   Nani Gasser MD on 09/07/2009   Method used:   Electronically to        [facility] [phone]* (retail)       [address] [place], [place]  [address]       Ph: [phone]       Fax: [phone]   RxID:   [id number]

## 2010-03-09 NOTE — Assessment & Plan Note (Signed)
Summary: GLUCOSE, Constipation   Vital Signs:  Patient Profile:   70 Years Old Female Height:     64.5 inches Weight:      180 pounds Pulse rate:   67 / minute BP sitting:   117 / 70  (left arm) Cuff size:   regular  Vitals Entered By: Kathlene November (August 25, 2006 9:35 AM)               PCP:  Cipriano Bunker  Chief Complaint:  check up blood sugar.  History of Present Illness: Insulin resistance- Has been eating a very healthy diet.  See below.  Also, trying to be more active. has lost 10 punds, then sprained her ankle and has gained some weight back becuase was inactive for about 4 weeks.   Constipation-  Eat a high fiber cereal or oatmeal for breakfast. Eats a salad with raw veggies.  eating fruit in the afternoon.  Taking a stool softner three times a day. Occ use an Ex-Lax ti get bowels moving.  Using it once a week.  Says drinks plenty of water.  Has been a problem for years.   Current Allergies: ! CODEINE SULFATE (CODEINE SULFATE) ! SULFA ! PROZAC  Past Medical History:    3 bulging disks, mild spinal stenosis,  L4 affected    Hx of Diverticuli, colon,     Lung nodules, calcified granulomas,     ABIs were normal at screen done on 03/31/06      Physical Exam  General:     Well-developed,well-nourished,in no acute distress; alert,appropriate and cooperative throughout examination    Impression & Recommendations:  Problem # 1:  ABNORMAL GLUCOSE NEC (ICD-790.29) Very encouraged that CBG is down today.  Recheck in October with routine labs.  If ok then follow yearly.   Get back into regular exercise.  discussed some ankle strengthening exercises to help prevent recurrent injury to her ankle.  Orders: Fingerstick (40981) Glucose,(CBG) using home device (19147) Hgb A1C (82956OZ)   Problem # 2:  CONSTIPATION NOS (ICD-564.00) Discussed adding Miralax to diet daily. Can use once or two times a day. Needs to minimize use of stimulants like Ex-Lax. Continue with high  fiber diet and plenty of water and the stool softner with each meal. If not improving can consider a product like Amitiza.   Medications Added to Medication List This Visit: 1)  Bl Flax Seed Oil 1000 Mg Caps (Flaxseed (linseed)) .... Take one tablet by mouth three times a day        Laboratory Results   Blood Tests   Date/Time Recieved: 08/25/06 @ 9:31am Date/Time Reported: 08/25/06 @ 9:31am5.6  Glucose (fasting): 101 mg/dL   (Normal Range: 30-865) HGBA1C: 5.6%   (Normal Range: Non-Diabetic - 3-6%   Control Diabetic - 6-8%)

## 2010-03-09 NOTE — Letter (Signed)
Summary: Depression Questionnaire/[facility]  Depression Questionnaire/[facility]   Imported By: Lanelle Bal 10/23/2008 08:44:27  _____________________________________________________________________  External Attachment:    Type:   Image     Comment:   External Document

## 2010-03-09 NOTE — Assessment & Plan Note (Signed)
Summary: Acute sinusitis   Vital Signs:  Patient profile:   70 year old female Height:      64.5 inches Weight:      192.75 pounds Temp:     98.6 degrees F oral Pulse rate:   79 / minute BP sitting:   108 / 63  Vitals Entered By: Kandice Hams (March 16, 2009 1:35 PM) CC: c/o sinus pressure head and ears, cough drainage,  need refill paroxetine to [facility]   Primary Care Provider:  Alden Hipp, MD  CC:  c/o sinus pressure head and ears, cough drainage, and need refill paroxetine to [facility].  History of Present Illness: c/o sinus pressure head and ears, cough drainage,  need refill paroxetine to [facility]. Was here 4 weeks ago for flu like illness.  Got better but kept getting afternoon sinus HA. + sick contacts.  Fever about 2 days ago.  No SOB or wheeze.  Clear drainage. Taking Advil and Tylenol for her aches and pains.  No GI sxs. Now has severe HA adn facial pressure.   Allergies: 1)  ! Codeine Sulfate (Codeine Sulfate) 2)  ! Sulfa 3)  ! Prozac  Family History: Alcoholism, heart disease-Father, Brother Mother had RA, kidney failure Father had psoriasis, lung cancer Son with Lung Ca, HIV  Matt deceased fall 07-13-2007 Family History of Crohn's: Brother Therapist, art with SLE and CHF.   Physical Exam  General:  Well-developed,well-nourished,in no acute distress; alert,appropriate and cooperative throughout examination Head:  Normocephalic and atraumatic without obvious abnormalities. No apparent alopecia or balding. Eyes:  No corneal or conjunctival inflammation noted. EOMI. Perrla.  Ears:  External ear exam shows no significant lesions or deformities.  Otoscopic examination reveals clear canals, tympanic membranes are intact bilaterally without bulging, retraction, inflammation or discharge. Hearing is grossly normal bilaterally. Nose:  External nasal examination shows no deformity or inflammation.  Mouth:  Oral mucosa and oropharynx  without lesions or exudates.  Teeth in good repair. Neck:  No deformities, masses, or tenderness noted. Lungs:  Normal respiratory effort, chest expands symmetrically. Lungs are clear to auscultation, no crackles or wheezes. Heart:  Normal rate and regular rhythm. S1 and S2 normal without gallop, murmur, click, rub or other extra sounds. Pulses:  Radial 2+  Neurologic:  alert & oriented X3.   Skin:  no rashes.   Cervical Nodes:  No lymphadenopathy noted Psych:  Cognition and judgment appear intact. Alert and cooperative with normal attention span and concentration. No apparent delusions, illusions, hallucinations   Impression & Recommendations:  Problem # 1:  SINUSITIS - ACUTE-NOS (ICD-461.9)  Her updated medication list for this problem includes:    Hydrocodone-homatropine 5-1.5 Mg/60ml Syrp (Hydrocodone-homatropine) .Marland KitchenMarland KitchenMarland KitchenMarland Kitchen 5ml by mouth at bedtime as needed cough    Augmentin 875-125 Mg Tabs (Amoxicillin-pot clavulanate) .Marland Kitchen... Take 1 tablet by mouth two times a day for 14  days  Instructed on treatment. Call if symptoms persist or worsen. If not better in 2 weeks please call the office.   Complete Medication List: 1)  Aspir-low 81 Mg Tbec (Aspirin) .... Take one tablet daily 2)  Calcium-vitamin D 250-125 Mg-unit Tabs (Calcium carbonate-vitamin d) .... Take one twice a day 3)  Zocor 20 Mg Tabs (Simvastatin) .... Take one tablet by mouth daily 4)  Xanax 0.5 Mg Tabs (Alprazolam) .... Take 1 tablet by mouth two times a day as needed 5)  Multivitamins Caps (Multiple vitamin) .... Take one by mouth daily 6)  Bl Flax Seed Oil 1000 Mg  Caps (Flaxseed (linseed)) .... Take one tablet by mouth three times a day 7)  Miralax Powd (Polyethylene glycol 3350) .... One capful once daily as needed constipation 8)  Stool Softener 100 Mg Caps (Docusate sodium) .Marland Kitchen.. 1 by mouth qlunchtime and 2 by mouth at bedtime 9)  Vitamin D3 1000 Unit Tabs (Cholecalciferol) .Marland Kitchen.. 1 by mouth once daily 10)  Zolpidem  Tartrate 5 Mg Tabs (Zolpidem tartrate) .... Take 1 tablet by mouth once a day at bedtime 11)  Paroxetine Hcl 20 Mg Tabs (Paroxetine hcl) .... Take 1 tablet by mouth once a day 12)  Trazodone Hcl 50 Mg Tabs (Trazodone hcl) .... 1/2-1 tab by mouth once a day at bedtime. 13)  Tramadol Hcl 50 Mg Tabs (Tramadol hcl) .... Take 1 tablet by mouth three times a day as needed 14)  Omeprazole 20 Mg Cpdr (Omeprazole) .... Take one capsule by mouth twice a day 15)  Hydrocodone-homatropine 5-1.5 Mg/20ml Syrp (Hydrocodone-homatropine) .... 5ml by mouth at bedtime as needed cough 16)  Augmentin 875-125 Mg Tabs (Amoxicillin-pot clavulanate) .... Take 1 tablet by mouth two times a day for 14  days Prescriptions: PAROXETINE HCL 20 MG TABS (PAROXETINE HCL) Take 1 tablet by mouth once a day  #90 x 1   Entered and Authorized by:   Nani Gasser MD   Signed by:   Nani Gasser MD on 03/16/2009   Method used:   Electronically to        [facility] [phone]* (retail)       [address] [place], [place]  [address]       Ph: [phone]       Fax: [phone]   RxID:   [id number] AUGMENTIN 875-125 MG TABS (AMOXICILLIN-POT CLAVULANATE) Take 1 tablet by mouth two times a day for 14  days  #28 x 0   Entered and Authorized by:   Nani Gasser MD   Signed by:   Nani Gasser MD on 03/16/2009   Method used:   Electronically to        [facility] [phone]* (retail)       [address] [place], [place]  [address]       Ph: [phone]       Fax: [phone]   RxID:   [id number]   Appended Document: Acute sinusitis

## 2010-03-09 NOTE — Consult Note (Signed)
Summary: [facility]  [facility]   Imported By: Lanelle Bal 09/07/2009 11:48:11  _____________________________________________________________________  External Attachment:    Type:   Image     Comment:   External Document

## 2010-03-09 NOTE — Assessment & Plan Note (Signed)
Summary: DUSSCUSS DOG BITE//TH  Nurse Visit  Looked at area and instructed her to look and watch for any signs of infection- redness, swelling, warm to touch and instructed to watch for any unusaul signs of dry mouth or excesive thirst ? rabies.  Prior Medications: ASPIR-LOW 81 MG TBEC (ASPIRIN) Take one tablet daily CALCIUM-VITAMIN D 250-125 MG-UNIT TABS (CALCIUM CARBONATE-VITAMIN D) Take one twice a day TRAMADOL HCL 50 MG  TABS (TRAMADOL HCL) one by mouth every 6 hours as needed pain ZOCOR 20 MG TABS (SIMVASTATIN) Take one tablet by mouth daily XANAX 0.5 MG TABS (ALPRAZOLAM) Take 1 tablet by mouth two times a day as needed PREVACID 30 MG CPDR (LANSOPRAZOLE) Take one by mouth daily PAXIL 20 MG TABS (PAROXETINE HCL) Take one by mouth daily MULTIVITAMINS  CAPS (MULTIPLE VITAMIN) Take one by mouth daily FLUNISOLIDE 29 MCG/ACT SOLN (FLUNISOLIDE) two sprays/nostril daily TRAZODONE HCL 100 MG TABS (TRAZODONE HCL) Take 1 tablet by mouth once a day CINNAMON 500 MG CAPS (CINNAMON) two times a day BL FLAX SEED OIL 1000 MG  CAPS (FLAXSEED (LINSEED)) Take one tablet by mouth three times a day PIROXICAM 20 MG  CAPS (PIROXICAM) Take 1 tablet by mouth once a day as needed joint pain PROAIR HFA 108 (90 BASE) MCG/ACT  AERS (ALBUTEROL SULFATE) 2-4 puffs inhaled every 4-6 hours as needed cough or shortness of breath Current Allergies: ! CODEINE SULFATE (CODEINE SULFATE) ! SULFA ! [email or web address].Jumper      ]  Appended Document: Orders Update    Clinical Lists Changes  Problems: Added new problem of DOG BITE (ICD-E906.0) Orders: Added new Service order of Est. Patient Level I ([address]) - Signed  Appended Document: DUSSCUSS DOG BITE//TH

## 2010-03-09 NOTE — Medication Information (Signed)
Summary: Prior Authorization Request and Approval for [facility]  Prior Authorization Request and Approval for [facility]   Imported By: Maryln Gottron 09/08/2008 15:36:37  _____________________________________________________________________  External Attachment:    Type:   Image     Comment:   External Document

## 2010-03-09 NOTE — Assessment & Plan Note (Signed)
Summary: Constipaton, HA   Vital Signs:  Patient profile:   70 year old female Height:      64.5 inches Weight:      196 pounds Temp:     98.3 degrees F oral Pulse rate:   87 / minute BP sitting:   125 / 67  (left arm) Cuff size:   regular  Vitals Entered By: Kathlene November (August 15, 2008 3:10 PM) CC: sinus pressure, H/A, pressure behind eyes and ears, some nausea. Sx's started last Sunday   Primary Care Provider:  Alden Hipp, MD  CC:  sinus pressure, H/A, pressure behind eyes and ears, and some nausea. Sx's started last Sunday.  History of Present Illness: 6 days ago started with HA. Took excedrin.Then felt nauseated. Sinus pressure started around the same time. Some chills.  No sneezing, runny nose. Feels congested. Has been very Nauseated today.  No abdominal pain or GERD. Has been very constipated. Last normal BM was 2 weeks ago.  Taking Miralax to get things going.Started about  week ago.    Current Medications (verified): 1)  Aspir-Low 81 Mg Tbec (Aspirin) .... Take One Tablet Daily 2)  Calcium-Vitamin D 250-125 Mg-Unit Tabs (Calcium Carbonate-Vitamin D) .... Take One Twice A Day 3)  Zocor 20 Mg Tabs (Simvastatin) .... Take One Tablet By Mouth Daily 4)  Xanax 0.5 Mg Tabs (Alprazolam) .... Take 1 Tablet By Mouth Two Times A Day As Needed 5)  Omeprazole 20 Mg Tbec (Omeprazole) .... Take 1 Tablet By Mouth Two Times A Day 6)  Multivitamins  Caps (Multiple Vitamin) .... Take One By Mouth Daily 7)  Flunisolide 29 Mcg/act Soln (Flunisolide) .... Two Sprays/nostril Daily 8)  Bl Flax Seed Oil 1000 Mg  Caps (Flaxseed (Linseed)) .... Take One Tablet By Mouth Three Times A Day 9)  Miralax   Powd (Polyethylene Glycol 3350) .... One Capful Once Daily As Needed Constipation 10)  Stool Softener 100 Mg Caps (Docusate Sodium) .Marland Kitchen.. 1 By Mouth Qlunchtime and 2 By Mouth At Bedtime 11)  Vitamin D3 1000 Unit Tabs (Cholecalciferol) .Marland Kitchen.. 1 By Mouth Once Daily 12)  Claritin 10 Mg Tabs  (Loratadine) .Marland Kitchen.. 1 By Mouth Once Daily 13)  Tens Unit .... As Directed 14)  Darvocet-N 100 100-650 Mg Tabs (Propoxyphene N-Apap) .Marland Kitchen.. 1 Tab By Mouth Three Times A Day As Needed Severe Pain 15)  Zolpidem Tartrate 5 Mg Tabs (Zolpidem Tartrate) .... Take 1 Tablet By Mouth Once A Day At Bedtime 16)  Dexamethasone 1 Mg Tabs (Dexamethasone) .... Take One At 11pm By Mouth and Then Have Blood Draw The Next Day At 8am.  Allergies (verified): 1)  ! Codeine Sulfate (Codeine Sulfate) 2)  ! Sulfa 3)  ! Prozac  Comments:  Nurse/Medical Assistant: The patient's medications and allergies were reviewed with the patient and were updated in the Medication and Allergy Lists. Kathlene November (August 15, 2008 3:11 PM)  Physical Exam  General:  Well-developed,well-nourished,in no acute distress; alert,appropriate and cooperative throughout examination Head:  Normocephalic and atraumatic without obvious abnormalities. No apparent alopecia or balding. Eyes:  No corneal or conjunctival inflammation noted. EOMI. Perrla.  Ears:  External ear exam shows no significant lesions or deformities.  Otoscopic examination reveals clear canals, tympanic membranes are intact bilaterally without bulging, retraction, inflammation or discharge. Hearing is grossly normal bilaterally. Nose:  External nasal examination shows no deformity or inflammation. Nasal mucosa are pink and moist without lesions or exudates. Mouth:  Oral mucosa and oropharynx without lesions or  exudates.  Teeth in good repair. Neck:  No deformities, masses, or tenderness noted. Lungs:  Normal respiratory effort, chest expands symmetrically. Lungs are clear to auscultation, no crackles or wheezes. Heart:  Normal rate and regular rhythm. S1 and S2 normal without gallop, murmur, click, rub or other extra sounds. Abdomen:  Bowel sounds positive,abdomen soft and non-tender without masses, organomegaly or hernias noted. Skin:  Intact without suspicious lesions or  rashes Cervical Nodes:  No lymphadenopathy noted Psych:  Cognition and judgment appear intact. Alert and cooperative with normal attention span and concentration. No apparent delusions, illusions, hallucinations   Impression & Recommendations:  Problem # 1:  CONSTIPATION, CHRONIC (ICD-564.09) Discussed that many of her sxs may be coming from her contipation. Will treat by recommending and enema or glycerin supp over the weekend adn increase the miralax to two times a day until has a BM. If need to use a stimulant like Senokot S can. Call if not better on Monday .  Her updated medication list for this problem includes:    Miralax Powd (Polyethylene glycol 3350) ..... One capful once daily as needed constipation    Stool Softener 100 Mg Caps (Docusate sodium) .Marland Kitchen... 1 by mouth qlunchtime and 2 by mouth at bedtime  Problem # 2:  HEADACHE, SINUS (ICD-784.0) Assessment: New I am not 100% conviced that her facial pressure and HA are sinus related. Will give it over the weekedn to see if she feels better.  Her updated medication list for this problem includes:    Aspir-low 81 Mg Tbec (Aspirin) .Marland Kitchen... Take one tablet daily    Darvocet-n 100 100-650 Mg Tabs (Propoxyphene n-apap) .Marland Kitchen... 1 tab by mouth three times a day as needed severe pain  Complete Medication List: 1)  Aspir-low 81 Mg Tbec (Aspirin) .... Take one tablet daily 2)  Calcium-vitamin D 250-125 Mg-unit Tabs (Calcium carbonate-vitamin d) .... Take one twice a day 3)  Zocor 20 Mg Tabs (Simvastatin) .... Take one tablet by mouth daily 4)  Xanax 0.5 Mg Tabs (Alprazolam) .... Take 1 tablet by mouth two times a day as needed 5)  Omeprazole 20 Mg Tbec (Omeprazole) .... Take 1 tablet by mouth two times a day 6)  Multivitamins Caps (Multiple vitamin) .... Take one by mouth daily 7)  Flunisolide 29 Mcg/act Soln (Flunisolide) .... Two sprays/nostril daily 8)  Bl Flax Seed Oil 1000 Mg Caps (Flaxseed (linseed)) .... Take one tablet by mouth three  times a day 9)  Miralax Powd (Polyethylene glycol 3350) .... One capful once daily as needed constipation 10)  Stool Softener 100 Mg Caps (Docusate sodium) .Marland Kitchen.. 1 by mouth qlunchtime and 2 by mouth at bedtime 11)  Vitamin D3 1000 Unit Tabs (Cholecalciferol) .Marland Kitchen.. 1 by mouth once daily 12)  Claritin 10 Mg Tabs (Loratadine) .Marland Kitchen.. 1 by mouth once daily 13)  Tens Unit  .... As directed 14)  Darvocet-n 100 100-650 Mg Tabs (Propoxyphene n-apap) .Marland Kitchen.. 1 tab by mouth three times a day as needed severe pain 15)  Zolpidem Tartrate 5 Mg Tabs (Zolpidem tartrate) .... Take 1 tablet by mouth once a day at bedtime 16)  Dexamethasone 1 Mg Tabs (Dexamethasone) .... Take one at 11pm by mouth and then have blood draw the next day at 8am.

## 2010-03-09 NOTE — Assessment & Plan Note (Signed)
Summary: HOSP FU CHEST PAIN   Vital Signs:  Patient Profile:   70 Years Old Female Height:     64.5 inches Weight:      197 pounds Pulse rate:   86 / minute BP sitting:   117 / 65  (left arm) Cuff size:   regular  Vitals Entered By: Kathlene November (April 11, 2008 8:28 AM)                 PCP:  Alden Hipp, MD  Chief Complaint:  hospital followup.  History of Present Illness: Was have CP and SOB last Sunday 6 days ago. Towards the end of service felt her chest wias tight, lightheaded and nauseated. No actual chest pain.  She was worried it was her heart.  BP 145/107.  WEnt to the ED at [facility] and then transferred to [facility].  Had normal Caridac enzymes and normal telemetry. Took about 4 hours for her BP to improve.  Has been having trouble with her neck and back after shoveling snow.  Was in so much pain laid around for almost a week. She is using her TENS unit. Neck is still very painful. Seeing the chiropractoer. making it hard to sleep because diffivulty to get comfortable.  No more episodes of chest discomfort.  Still feels a little nauseated. Taking otc tylneol and hydrocodone as needed for her sholder (s/p surgery) and her neeck. Her low back is bothering her more since shoveling snow as well.   Had MRI on her lumbar spine and thoracid spine about 6-7 years ago.  Had bulging disc, spurs and degenerativeOA.  Tryig to strech and walk for her low back.  Still bothering her. Trying to rest otherwise.     Current Allergies: ! CODEINE SULFATE (CODEINE SULFATE) ! SULFA ! PROZAC  Past Medical History:    Reviewed history from 04/06/2008 and no changes required:       3 bulging disks, mild spinal stenosis,  L4 affected       Hx of Diverticuli, colon,        Lung nodules, calcified granulomas, chronic       ABIs were normal at screen done on 03/31/06       Arthritis       Depression/Anxiety       GERD       Irritable Bowel Syndrome       uterine cancer s/p hysterectomy          Past Surgical History:    Reviewed history from 04/06/2008 and no changes required:       Appendectomy  1960s,        EGD/colonoscopy-normal in2002,        Hysterectomy 1973       tonsilectomy as a child       Left shoulder surgery - Dr. Lajoyce Corners.      Review of Systems       The patient complains of weight loss.  The patient denies anorexia, weight gain, chest pain, and syncope.     Physical Exam  General:     Well-developed,well-nourished,in no acute distress; alert,appropriate and cooperative throughout examination Head:     Normocephalic and atraumatic without obvious abnormalities. No apparent alopecia or balding. Eyes:     No corneal or conjunctival inflammation noted. EOMI. Perrla.  Ears:     External ear exam shows no significant lesions or deformities. She does have some mild erythematou along th inner lobe of cartilage on her right ear.  Lungs:     Normal respiratory effort, chest expands symmetrically. Lungs are clear to auscultation, no crackles or wheezes. Heart:     Normal rate and regular rhythm. S1 and S2 normal without gallop, murmur, click, rub or other extra sounds. No carotid bruits.  Msk:     Neck with Normal flexion and extension. Decreased side bend bilat and rotation right and left. She is tender at the occiput and her trap muscles are tender and tight. No cervical or thoracic tenderness. Shoulder with NROM and strenght 5/5. Elbow, wrist and thumb strength 5/5.     Pulses:     Radial 2+  Skin:     no rashes.   Psych:     Cognition and judgment appear intact. Alert and cooperative with normal attention span and concentration. No apparent delusions, illusions, hallucinations    Impression & Recommendations:  Problem # 1:  CHEST PAIN (ICD-786.50) Assessment: New Symptoms have mainly resovling.  Her last stress test was about 7-8 years ago with Dr. Eden Emms so will set up for cardiolyte. Pt feels she can't dothe treadmill with her current back and neck  pain. Will get copy of d/c summary and labs to make sure we don't need to fu on anything else.  Orders: Cardiology Referral (Cardiology)   Problem # 2:  NECK PAIN (ICD-723.1) Assessment: New Discussed can use her Tylenol and will set up for for PT. 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 She is having muscle spasm as well.  Her updated medication list for this problem includes:    Aspir-low 81 Mg Tbec (Aspirin) .Marland Kitchen... Take one tablet daily  Orders: Physical Therapy Referral (PT)   Problem # 3:  LOW BACK PAIN (ICD-724.2) Assessment: Deteriorated This is chronic recurrent.  Shoveling snow has exacerbated her pain. Will refer for PT for further tx. Fu in 6 weeks.  Her updated medication list for this problem includes:    Aspir-low 81 Mg Tbec (Aspirin) .Marland Kitchen... Take one tablet daily  Orders: Physical Therapy Referral (PT)   Complete Medication List: 1)  Aspir-low 81 Mg Tbec (Aspirin) .... Take one tablet daily 2)  Calcium-vitamin D 250-125 Mg-unit Tabs (Calcium carbonate-vitamin d) .... Take one twice a day 3)  Zocor 20 Mg Tabs (Simvastatin) .... Take one tablet by mouth daily 4)  Xanax 0.5 Mg Tabs (Alprazolam) .... Take 1 tablet by mouth two times a day as needed 5)  Omeprazole 20 Mg Tbec (Omeprazole) .... Take 1 tablet by mouth once a day before breakfast. 6)  Multivitamins Caps (Multiple vitamin) .... Take one by mouth daily 7)  Flunisolide 29 Mcg/act Soln (Flunisolide) .... Two sprays/nostril daily 8)  Trazodone Hcl 50 Mg Tabs (Trazodone hcl) .Marland Kitchen.. 1-2 by mouth at bedtime as needed sleep 9)  Bl Flax Seed Oil 1000 Mg Caps (Flaxseed (linseed)) .... Take one tablet by mouth three times a day 10)  Miralax Powd (Polyethylene glycol 3350) .... One capful once daily as needed constipation 11)  Stool Softener 100 Mg Caps (Docusate sodium) .Marland Kitchen.. 1 by mouth qlunchtime and 2 by mouth at bedtime 12)  Vitamin D3 1000 Unit Tabs (Cholecalciferol) .Marland Kitchen.. 1 by mouth once  daily 13)  Claritin 10 Mg Tabs (Loratadine) .Marland Kitchen.. 1 by mouth once daily 14)  Tens Unit  .... As directed

## 2010-03-09 NOTE — Assessment & Plan Note (Signed)
Summary: CPE, etc   Vital Signs:  Patient Profile:   70 Years Old Female Height:     64.5 inches Weight:      202 pounds Pulse rate:   84 / minute BP sitting:   109 / 61  (left arm) Cuff size:   regular  Vitals Entered By: Kathlene November (December 14, 2007 9:38 AM)                 PCP:  Cipriano Bunker  Chief Complaint:  CPE no pap.  History of Present Illness: Right foot great toenail is now thick and white.  No pain or tenderness or drainage.   Prevacid no longer on her formulary. Wasn't lasting all day.  Will still get cough (ENT told her caused by GERD).  Needs a replacement.   Getting daily sinus HA in her frontal area for about a week. No nasal drainage or congestion. No eap pain or pressure or fever.    Feelsher paxil is nor working well right now.  Her son passed away.  She has not joined any greif group or counseling through hospice. Not sleeping well. Having a really hard time establishing a new routine.  Having a hard time staying focused.      Current Allergies: ! CODEINE SULFATE (CODEINE SULFATE) ! SULFA ! PROZAC  Past Medical History:    Reviewed history from 06/04/2007 and no changes required:       3 bulging disks, mild spinal stenosis,  L4 affected       Hx of Diverticuli, colon,        Lung nodules, calcified granulomas, chronic       ABIs were normal at screen done on 03/31/06  Past Surgical History:    Reviewed history from 11/24/2006 and no changes required:       Appendectomy  1960s,        EGD/colonoscopy-normal in2002,        Hysterectomy 1973   Family History:    Alcoholism-Father, Brother    Mother had RA, kidney failure    Father had psoriasis    Son with Lung Ca, HIV  Matt deceased fall 07/04/07  Social History:    Reviewed history from 01/15/2007 and no changes required:       12 yrs education. Married to [place] who is incarcerated.  4 adult chldren.  Quit smoking 1995, no drugs, no EtOH, 3 caffeinated drinks per day, Stretches daily and goes  for walks   Risk Factors: Tobacco use:  quit    Year quit:  07/03/93 Drug use:  no Caffeine use:  3 drinks per day Alcohol use:  no Exercise:  yes    Type:  Walking, stretching  Family History Risk Factors:    Family History of MI in females < 6 years old:  no    Family History of MI in males < 8 years old:  no  Colonoscopy History:    Date of Last Colonoscopy:  09/07/2000  Mammogram History:    Date of Last Mammogram:  12/04/2007  @  [facility]   Review of Systems  The patient denies anorexia, fever, weight loss, weight gain, vision loss, decreased hearing, hoarseness, chest pain, syncope, dyspnea on exertion, peripheral edema, prolonged cough, headaches, hemoptysis, abdominal pain, melena, hematochezia, severe indigestion/heartburn, hematuria, incontinence, genital sores, muscle weakness, suspicious skin lesions, transient blindness, difficulty walking, depression, unusual weight change, abnormal bleeding, enlarged lymph nodes, and breast masses.     Physical Exam  General:  Well-developed,well-nourished,in no acute distress; alert,appropriate and cooperative throughout examination Head:     Normocephalic and atraumatic without obvious abnormalities. No apparent alopecia or balding. Eyes:     No corneal or conjunctival inflammation noted. EOMI. Perrla.  Ears:     External ear exam shows no significant lesions or deformities.  Otoscopic examination reveals clear canals, tympanic membranes are intact bilaterally without bulging, retraction, inflammation or discharge. Hearing is grossly normal bilaterally. Nose:     External nasal examination shows no deformity or inflammation. Nasal mucosa are pink and moist without lesions or exudates. Mouth:     Oral mucosa and oropharynx without lesions or exudates.  Teeth in good repair. Neck:     No deformities, masses, or tenderness noted. Chest Wall:     No deformities, masses, or tenderness noted. Lungs:     Normal respiratory effort,  chest expands symmetrically. Lungs are clear to auscultation, no crackles or wheezes. Heart:     Normal rate and regular rhythm. S1 and S2 normal without gallop, murmur, click, rub or other extra sounds. Abdomen:     Bowel sounds positive,abdomen soft and non-tender without masses, organomegaly or hernias noted. Msk:     No deformity or scoliosis noted of thoracic or lumbar spine.   Pulses:     R and L carotid,radial,dorsalis pedis and posterior tibial pulses are full and equal bilaterally Extremities:     No clubbing, cyanosis, edema, or deformity noted with normal full range of motion of all joints.   Neurologic:     No cranial nerve deficits noted. Station and gait are normal. DTRs are symmetrical throughout. Sensory, motor and coordinative functions appear intact. Skin:     no rashes.  She has skin tags, seborrheaic keraotis, freckles and normal appearing moles.  Cervical Nodes:     No lymphadenopathy noted Psych:     Cognition and judgment appear intact. Alert and cooperative with normal attention span and concentration. No apparent delusions, illusions, hallucinations    Impression & Recommendations:  Problem # 1:  HEALTH MAINTENANCE EXAM (ICD-V70.0) Recently had her mammogram. Exam is normal. Discussed staring a new exercise program to lose the weight she has gained while taking care of her son. \par Screening cholesterol.  Orders: T-Lipid Profile ([id number])   Problem # 2:  COUGH (ICD-786.2) Gve her sampls of Protonix. If helps will change her Rx to this since Tier 2 on her plan. Nexium is also Tier 2.   Problem # 3:  ANXIETY (ICD-300.00) Increas Paxil to 40mg . Consider changin to Seroquel at bedtime. Will try for 1-2 moths. Warned not to take with the trazodone and Xanax.  FUI in one month. Encouraged her to think about grief counseling.  Her updated medication list for this problem includes:    Xanax 0.5 Mg Tabs (Alprazolam) .Marland Kitchen... Take 1 tablet by mouth two times a  day as needed    Paxil 40 Mg Tabs (Paroxetine hcl) .Marland Kitchen... Take 1 tablet by mouth once a day    Trazodone Hcl 100 Mg Tabs (Trazodone hcl) .Marland Kitchen... Take 1 tablet by mouth once a day   Problem # 4:  HEADACHE, SINUS (ICD-784.0) Likely from allegies. Trial of Claritin or Zyrtec OTC>  The following medications were removed from the medication list:    Tramadol Hcl 50 Mg Tabs (Tramadol hcl) ..... One by mouth every 6 hours as needed pain  Her updated medication list for this problem includes:    Aspir-low 81 Mg Tbec (Aspirin) .Marland Kitchen... Take one tablet daily  Piroxicam 20 Mg Caps (Piroxicam) .Marland Kitchen... Take 1 tablet by mouth once a day as needed joint pain   Complete Medication List: 1)  Aspir-low 81 Mg Tbec (Aspirin) .... Take one tablet daily 2)  Calcium-vitamin D 250-125 Mg-unit Tabs (Calcium carbonate-vitamin d) .... Take one twice a day 3)  Zocor 20 Mg Tabs (Simvastatin) .... Take one tablet by mouth daily 4)  Xanax 0.5 Mg Tabs (Alprazolam) .... Take 1 tablet by mouth two times a day as needed 5)  Prevacid 30 Mg Cpdr (Lansoprazole) .... Take one by mouth daily 6)  Paxil 40 Mg Tabs (Paroxetine hcl) .... Take 1 tablet by mouth once a day 7)  Multivitamins Caps (Multiple vitamin) .... Take one by mouth daily 8)  Flunisolide 29 Mcg/act Soln (Flunisolide) .... Two sprays/nostril daily 9)  Trazodone Hcl 100 Mg Tabs (Trazodone hcl) .... Take 1 tablet by mouth once a day 10)  Cinnamon 500 Mg Caps (Cinnamon) .... Two times a day 11)  Bl Flax Seed Oil 1000 Mg Caps (Flaxseed (linseed)) .... Take one tablet by mouth three times a day 12)  Piroxicam 20 Mg Caps (Piroxicam) .... Take 1 tablet by mouth once a day as needed joint pain 13)  Proair Hfa 108 (90 Base) Mcg/act Aers (Albuterol sulfate) .... 2-4 puffs inhaled every 4-6 hours as needed cough or shortness of breath 14)  Seroquel 25 Mg Tabs (Quetiapine fumarate) .... Take 1 tablet by mouth once a day    Prescriptions: SEROQUEL 25 MG TABS (QUETIAPINE  FUMARATE) Take 1 tablet by mouth once a day  #30 x 1   Entered and Authorized by:   Nani Gasser MD   Signed by:   Nani Gasser MD on 12/14/2007   Method used:   Electronically to        [facility] [phone]* (retail)       [address] [place], [place]  [address]       Ph: [phone]       Fax: [phone]   RxID:   [id number] PAXIL 40 MG TABS (PAROXETINE HCL) Take 1 tablet by mouth once a day  #30 x 2   Entered and Authorized by:   Nani Gasser MD   Signed by:   Nani Gasser MD on 12/14/2007   Method used:   Electronically to        [facility] [phone]* (retail)       [address] [place], [place]  [address]       Ph: [phone]       Fax: [phone]   RxID:   [id number]  ]

## 2010-03-09 NOTE — Assessment & Plan Note (Signed)
Summary: Mood, insomnia   Vital Signs:  Patient profile:   70 year old female Height:      64.5 inches Weight:      196 pounds Pulse rate:   80 / minute BP sitting:   106 / 54  (left arm) Cuff size:   large  Vitals Entered By: Kathlene November (September 29, 2008 1:55 PM) CC: discuss anti-depressant Paxil that pt re started on her own   Primary Care Provider:  Alden Hipp, MD  CC:  discuss anti-depressant Paxil that pt re started on her own.  History of Present Illness: discuss anti-depressant Paxil that pt re started on her own.  REstarted at 40mg .  Was stressed over moneya dn has been trying to find a job and started to fill really down.  Started about 1 month, no much improvement in her mood yet.  Not sleeping well. Has had to take a few Ambien.  Has been a little more tired lately.  Gets about 6 hours if takes a whole tab. Has also had some mild "cluser HA" and lack of focus.   Current Medications (verified): 1)  Aspir-Low 81 Mg Tbec (Aspirin) .... Take One Tablet Daily 2)  Calcium-Vitamin D 250-125 Mg-Unit Tabs (Calcium Carbonate-Vitamin D) .... Take One Twice A Day 3)  Zocor 20 Mg Tabs (Simvastatin) .... Take One Tablet By Mouth Daily 4)  Xanax 0.5 Mg Tabs (Alprazolam) .... Take 1 Tablet By Mouth Two Times A Day As Needed 5)  Omeprazole 20 Mg Tbec (Omeprazole) .... Take 1 Tablet By Mouth Two Times A Day 6)  Multivitamins  Caps (Multiple Vitamin) .... Take One By Mouth Daily 7)  Flunisolide 29 Mcg/act Soln (Flunisolide) .... Two Sprays/nostril Daily 8)  Bl Flax Seed Oil 1000 Mg  Caps (Flaxseed (Linseed)) .... Take One Tablet By Mouth Three Times A Day 9)  Miralax   Powd (Polyethylene Glycol 3350) .... One Capful Once Daily As Needed Constipation 10)  Stool Softener 100 Mg Caps (Docusate Sodium) .Marland Kitchen.. 1 By Mouth Qlunchtime and 2 By Mouth At Bedtime 11)  Vitamin D3 1000 Unit Tabs (Cholecalciferol) .Marland Kitchen.. 1 By Mouth Once Daily 12)  Claritin 10 Mg Tabs (Loratadine) .Marland Kitchen.. 1 By Mouth Once  Daily 13)  Tens Unit .... As Directed 14)  Darvocet-N 100 100-650 Mg Tabs (Propoxyphene N-Apap) .Marland Kitchen.. 1 Tab By Mouth Three Times A Day As Needed Severe Pain 15)  Zolpidem Tartrate 5 Mg Tabs (Zolpidem Tartrate) .... Take 1 Tablet By Mouth Once A Day At Bedtime 16)  Dexamethasone 1 Mg Tabs (Dexamethasone) .... Take One At 11pm By Mouth and Then Have Blood Draw The Next Day At 8am. 17)  Amoxicillin 500 Mg Caps (Amoxicillin) .... Take 1 Tablet By Mouth Three Times A Day For 10 Days 18)  Amitiza 24 Mcg Caps (Lubiprostone) .... Take 1 Tablet By Mouth Two Times A Day For Chronic Constipation 19)  Paxil 40 Mg Tabs (Paroxetine Hcl) .... Take One Tablet By Mouth Once A Day  Allergies (verified): 1)  ! Codeine Sulfate (Codeine Sulfate) 2)  ! Sulfa 3)  ! Prozac  Comments:  Nurse/Medical Assistant: The patient's medications and allergies were reviewed with the patient and were updated in the Medication and Allergy Lists. Kathlene November (September 29, 2008 1:55 PM)  Physical Exam  General:  Well-developed,well-nourished,in no acute distress; alert,appropriate and cooperative throughout examination   Impression & Recommendations:  Problem # 1:  ANXIETY (ICD-300.00) Will continue on Paxil 40mg  and fu in one month. Wraned her  that next time if restart need to wean up on the dose.  Will give it a little more time to work. Will add trazodone for sleep. WAs n this combo in the past and felt it did help. PHQ-9 score was 15 (moderately severe).   Her updated medication list for this problem includes:    Xanax 0.5 Mg Tabs (Alprazolam) .Marland Kitchen... Take 1 tablet by mouth two times a day as needed    Paxil 40 Mg Tabs (Paroxetine hcl) .Marland Kitchen... Take one tablet by mouth once a day    Trazodone Hcl 50 Mg Tabs (Trazodone hcl) .Marland Kitchen... 1/2-1 tab by mouth once a day at bedtime.  Problem # 2:  INSOMNIA (ICD-780.52) Doing well on Ambien 5mg . Will restart trazodone. Cut ambien healf for the first few nights on teh trazodone to see if  it helps.  Her updated medication list for this problem includes:    Zolpidem Tartrate 5 Mg Tabs (Zolpidem tartrate) .Marland Kitchen... Take 1 tablet by mouth once a day at bedtime  Problem # 3:  CONSTIPATION, CHRONIC (ICD-564.09) Using the amitiza. If take two times a day then gets stool incontinence but if takes only once a day has mroe regular BM but still has too straing. Still taking the softener three times a day. Gave samples if the 8mg  to take two times a day and see if helps.  Her updated medication list for this problem includes:    Miralax Powd (Polyethylene glycol 3350) ..... One capful once daily as needed constipation    Stool Softener 100 Mg Caps (Docusate sodium) .Marland Kitchen... 1 by mouth qlunchtime and 2 by mouth at bedtime  Complete Medication List: 1)  Aspir-low 81 Mg Tbec (Aspirin) .... Take one tablet daily 2)  Calcium-vitamin D 250-125 Mg-unit Tabs (Calcium carbonate-vitamin d) .... Take one twice a day 3)  Zocor 20 Mg Tabs (Simvastatin) .... Take one tablet by mouth daily 4)  Xanax 0.5 Mg Tabs (Alprazolam) .... Take 1 tablet by mouth two times a day as needed 5)  Omeprazole 20 Mg Tbec (Omeprazole) .... Take 1 tablet by mouth two times a day 6)  Multivitamins Caps (Multiple vitamin) .... Take one by mouth daily 7)  Flunisolide 29 Mcg/act Soln (Flunisolide) .... Two sprays/nostril daily 8)  Bl Flax Seed Oil 1000 Mg Caps (Flaxseed (linseed)) .... Take one tablet by mouth three times a day 9)  Miralax Powd (Polyethylene glycol 3350) .... One capful once daily as needed constipation 10)  Stool Softener 100 Mg Caps (Docusate sodium) .Marland Kitchen.. 1 by mouth qlunchtime and 2 by mouth at bedtime 11)  Vitamin D3 1000 Unit Tabs (Cholecalciferol) .Marland Kitchen.. 1 by mouth once daily 12)  Claritin 10 Mg Tabs (Loratadine) .Marland Kitchen.. 1 by mouth once daily 13)  Tens Unit  .... As directed 14)  Darvocet-n 100 100-650 Mg Tabs (Propoxyphene n-apap) .Marland Kitchen.. 1 tab by mouth three times a day as needed severe pain 15)  Zolpidem Tartrate 5  Mg Tabs (Zolpidem tartrate) .... Take 1 tablet by mouth once a day at bedtime 16)  Dexamethasone 1 Mg Tabs (Dexamethasone) .... Take one at 11pm by mouth and then have blood draw the next day at 8am. 17)  Amoxicillin 500 Mg Caps (Amoxicillin) .... Take 1 tablet by mouth three times a day for 10 days 18)  Amitiza 24 Mcg Caps (Lubiprostone) 19)  Paxil 40 Mg Tabs (Paroxetine hcl) .... Take one tablet by mouth once a day 20)  Trazodone Hcl 50 Mg Tabs (Trazodone hcl) .... 1/2-1 tab  by mouth once a day at bedtime. Prescriptions: TRAZODONE HCL 50 MG TABS (TRAZODONE HCL) 1/2-1 tab by mouth once a day at bedtime.  #30 x 1   Entered and Authorized by:   Nani Gasser MD   Signed by:   Nani Gasser MD on 09/29/2008   Method used:   Electronically to        [facility] [phone]* (retail)       [address] [place], [place]  [address]       Ph: [phone]       Fax: [phone]   RxID:   [id number]

## 2010-03-09 NOTE — Assessment & Plan Note (Signed)
Summary: REFLUX, IBS PROBLEMS..EM   History of Present Illness Visit Type: new patient Primary GI MD: Yancey Flemings MD Primary Provider: Alden Hipp, MD Chief Complaint: reflux, bloating History of Present Illness:   Pleasant 70 year old white female with gastroesophageal reflux disease, irritable bowel syndrome, dyslipidemia, and depression. She presents today regarding abdominal bloating and increased intestinal gas. She has chronic constipation for which she uses MiraLax and stool softeners. This has not worsened. However, in caring for her terminally ill son who has subsequently passed away, she has gained about 20 pounds over the past 6-8 months. In recent months she has noted increased belching as well as increased flatus. Also some abdominal bloating discomfort. No nausea, vomiting, focal abdominal pain, weight loss, melena, hematochezia, fevers, or urinary complaints. She has been using acidophilus at home. She is maintained on omeprazole for reflux symptoms. No breakthrough. No dysphagia. She cannot identify any factors which either improve, bring on, or exacerbate symptoms. There seems to be no effect from meals, stress, or change in body position. No difference with time of day. She was last evaluated in this office in 2005. At that time, complaints included constipation, increased gas, lower abdominal discomfort, bloating, and nausea. She was experiencing stress at that time related to her husband's Alzheimer's disease. She did undergo colonoscopy and upper endoscopy in August of 2002. Colonoscopy revealed hemorrhoids and diverticulosis as well as melanosis coli. Upper endoscopy was normal. Previous CT scan of the abdomen and pelvis performed in August of 2005 was negative for acute abnormalities.   GI Review of Systems    Reports belching, bloating, and  weight gain.      Denies abdominal pain, acid reflux, chest pain, dysphagia with liquids, dysphagia with solids, heartburn, loss of  appetite, nausea, vomiting, vomiting blood, and  weight loss.      Reports constipation and  irritable bowel syndrome.     Denies anal fissure, black tarry stools, change in bowel habit, diarrhea, diverticulosis, fecal incontinence, heme positive stool, hemorrhoids, jaundice, light color stool, liver problems, rectal bleeding, and  rectal pain.     Prior Medications Reviewed Using: List Brought by Patient  Updated Prior Medication List: ASPIR-LOW 81 MG TBEC (ASPIRIN) Take one tablet daily CALCIUM-VITAMIN D 250-125 MG-UNIT TABS (CALCIUM CARBONATE-VITAMIN D) Take one twice a day ZOCOR 20 MG TABS (SIMVASTATIN) Take one tablet by mouth daily XANAX 0.5 MG TABS (ALPRAZOLAM) Take 1 tablet by mouth two times a day as needed OMEPRAZOLE 20 MG TBEC (OMEPRAZOLE) Take 1 tablet by mouth once a day before breakfast. PAXIL 40 MG TABS (PAROXETINE HCL) Take 1 tablet by mouth once a day MULTIVITAMINS  CAPS (MULTIPLE VITAMIN) Take one by mouth daily FLUNISOLIDE 29 MCG/ACT SOLN (FLUNISOLIDE) two sprays/nostril daily TRAZODONE HCL 100 MG TABS (TRAZODONE HCL) Take 1 tablet by mouth once a day BL FLAX SEED OIL 1000 MG  CAPS (FLAXSEED (LINSEED)) Take one tablet by mouth three times a day PROAIR HFA 108 (90 BASE) MCG/ACT  AERS (ALBUTEROL SULFATE) 2-4 puffs inhaled every 4-6 hours as needed cough or shortness of breath SEROQUEL 25 MG TABS (QUETIAPINE FUMARATE) Take 1 tablet by mouth once a day MIRALAX   POWD (POLYETHYLENE GLYCOL 3350) one capful once daily STOOL SOFTENER 100 MG CAPS (DOCUSATE SODIUM) as needed  Current Allergies (reviewed today): ! CODEINE SULFATE (CODEINE SULFATE) ! SULFA ! PROZAC  Past Medical History:    3 bulging disks, mild spinal stenosis,  L4 affected    Hx of Diverticuli, colon,  Lung nodules, calcified granulomas, chronic    ABIs were normal at screen done on 03/31/06    Arthritis    Depression    GERD    Irritable Bowel Syndrome    uterine cancer   Past Surgical History:     Reviewed history from 11/24/2006 and no changes required:       Appendectomy  1960s,        EGD/colonoscopy-normal in2002,        Hysterectomy 1973   Family History:    Alcoholism-Father, Brother    Mother had RA, kidney failure    Father had psoriasis    Son with Lung Ca, HIV  Matt deceased fall 2007-07-03    Family History of Colitis/Crohn's: Brother    Review of Systems       sinus an allergy, arthritis, cough, depression, fatigue, urinary leakage, ankle swelling. all other systems negative   Vital Signs:  Patient Profile:   70 Years Old Female Height:     64.5 inches Weight:      199 pounds BMI:     33.75 Pulse rate:   76 / minute Pulse rhythm:   regular BP sitting:   110 / 66  (left arm) Cuff size:   regular  Vitals Entered By: Francee Piccolo CMA (January 17, 2008 11:05 AM)                  Physical Exam  General:     Well developed, well nourished, no acute distress. Head:     Normocephalic and atraumatic. Eyes:     PERRLA, no icterus. Nose:     No deformity, discharge,  or lesions. Mouth:     No deformity or lesions, dentition normal. Neck:     Supple; no masses or thyromegaly. Lungs:     Clear throughout to auscultation. Heart:     Regular rate and rhythm; no murmurs, rubs,  or bruits. Abdomen:     Soft, nontender and nondistended. No masses, hepatosplenomegaly or hernias noted. Normal bowel sounds. Msk:     Symmetrical with no gross deformities. Normal posture. Pulses:     Normal pulses noted. Extremities:     No clubbing, cyanosis, edema or deformities noted. Neurologic:     Alert and  oriented x4;  grossly normal neurologically. Skin:     Intact without significant lesions or rashes. Psych:     Alert and cooperative. Normal mood and affect.    Impression & Recommendations:  Problem # 1:  INTESTINAL GAS (ICD-787.3) Increased intestinal gas is manifested by bloating belching and flatus. She's also had weight gain which I believe  leads to some of her bowel discomfort. No worrisome features. She is concerned that symptoms have been worse in recent months. I suspect this is all a feature of known irritable bowel.  Plan: #1. Anti-gas and flatulence dietary sheet #2. Discussion regarding the pathophysiology and treatment of excessive intestinal gas. Supplemental brochure on the same topic provided. #3. Weight loss #4. Obtain CT scan of the abdomen and pelvis to exclude any significant abnormalities and if negative provide reassurance #5. Short trial of probiotic Florastor  Problem # 2:  IRRITABLE BOWEL SYNDROME (ICD-564.1) Constipation predominant. Ongoing. Plans as above. In addition continue MiraLax for bowel regulation  Problem # 3:  CONSTIPATION (ICD-564.00) see above  Problem # 4:  DIVERTICULOSIS, COLON (ICD-562.10) incidental  Other Orders: TLB-BUN (Urea Nitrogen) (84520-BUN) TLB-Creatinine, Blood (82565-CREA) CT Abdomen/Pelvis with Contrast (CT Abd/Pelvis w/con)   Patient Instructions: 1)  CT scan scheduled for 01/21/08 10:00  [place] CT 2)  Contrast given to patient with instructions. 3)  Gas Brochure/Diet given to patient. 4)  Florastor samples given to patient with instructions to take 1 daily. 5)  BUN/CREAT ordered for patient to have drawn today. 6)  Copy to: Dr. Alden Hipp    ]  Appended Document: REFLUX, IBS PROBLEMS..EM LET PATIENT KNOW THAT MRI LOOKS OK. SEND A COPY TO DR. Eppie Gibson

## 2010-03-09 NOTE — Progress Notes (Signed)
Summary: FYI  Phone Note Call from Patient Call back at Home Phone [phone]   Caller: Patient Call For: Nani Gasser MD Summary of Call: pt just wanted you to know that Dr Yates Decamp look at foot and dx with 2 neuromas.  She is currently getting cortisone shots Initial call taken by: Alfred Levins, CMA,  January 27, 2009 2:30 PM

## 2010-03-09 NOTE — Assessment & Plan Note (Signed)
Summary: Bilat Pedal Edema   Vital Signs:  Patient Profile:   70 Years Old Female Height:     64.5 inches Weight:      191 pounds Pulse rate:   74 / minute BP sitting:   116 / 65  (left arm) Cuff size:   regular  Vitals Entered By: Kathlene November (April 16, 2007 3:12 PM)                 PCP:  Cipriano Bunker  Chief Complaint:  edema of both lower extremities. Noticed on Saturday.1+ edema.  History of Present Illness: Was on feet alot the day before during a surprise birhtday party. Her feet have swelled before when she was up on them a fair amount.  Denies any excess salt intake. She is doing some walking and has actually lost 5-6 pounds.  Denies any Chest Pain. Does get occassionally SOB with acitivity but it is mild.  Also occassionally gets a pain near her anterior left shoulder under the clavicle, but says this is usually momemtary and often happens at rest.  No pain in legs with acitivity.  swelling is not painful.     Current Allergies: ! CODEINE SULFATE (CODEINE SULFATE) ! SULFA ! PROZAC  Past Medical History:    Reviewed history from 08/25/2006 and no changes required:       3 bulging disks, mild spinal stenosis,  L4 affected       Hx of Diverticuli, colon,        Lung nodules, calcified granulomas,        ABIs were normal at screen done on 03/31/06   Social History:    Reviewed history from 01/15/2007 and no changes required:       12 yrs education. Married to [place] who is incarcerated.  4 adult chldren.  Quit smoking 1995, no drugs, no EtOH, 3 caffeinated drinks per day, Stretches daily and goes for walks     Physical Exam  General:     Well-developed,well-nourished,in no acute distress; alert,appropriate and cooperative throughout examination Head:     Normocephalic and atraumatic without obvious abnormalities. No apparent alopecia or balding. Eyes:     No corneal or conjunctival inflammation noted. EOMI. Perrla.  Ears:     External ear exam shows no  significant lesions or deformities.  Otoscopic examination reveals clear canals, tympanic membranes are intact bilaterally without bulging, retraction, inflammation or discharge. Hearing is grossly normal bilaterally. Nose:     External nasal examination shows no deformity or inflammation.  Mouth:     Oral mucosa and oropharynx without lesions or exudates.  Teeth in good repair. Neck:     No deformities, masses, or tenderness noted. Lungs:     Normal respiratory effort, chest expands symmetrically. Lungs are clear to auscultation, no crackles or wheezes. Heart:     Normal rate and regular rhythm. S1 and S2 normal without gallop, murmur, click, rub or other extra sounds. Pulses:     DP 2+, PT 2+.  Cap refill <3sec.  Extremities:     1+ pretiabial edema up to knees bilat.  Psych:     Cognition and judgment appear intact. Alert and cooperative with normal attention span and concentration. No apparent delusions, illusions, hallucinations    Impression & Recommendations:  Problem # 1:  EDEMA (ICD-782.3) Suspect secondary to venous stasis but will check some baseline labs.  Did also get an EKG that was normal sinus rhythm with a rate of 70 bpm.  Check UA to rule out spilling of protein.  She does have blood in her urine. This will need to be rechecked in one week.   Orders: T-Comprehensive Metabolic Panel [id number]) T-CBC No Diff ([id number]) T-TSH [id number]) T-BNP  (B Natriuretic Peptide) ([id number]) T-Sed Rate (Automated) ([id number]) Urinalysis-dipstick w/o micro (81003) EKG w/ Interpretation (93000)   Complete Medication List: 1)  Aspir-low 81 Mg Tbec (Aspirin) .... Take one tablet daily 2)  Calcium-vitamin D 250-125 Mg-unit Tabs (Calcium carbonate-vitamin d) .... Take one twice a day 3)  Tramadol Hcl 50 Mg Tabs (Tramadol hcl) .... One by mouth every 6 hours as needed pain 4)  Zocor 20 Mg Tabs (Simvastatin) .... Take one tablet by mouth daily 5)  Xanax 0.5 Mg Tabs  (Alprazolam) .... Take one tablet by mouth at bedtime as needed 6)  Prevacid 30 Mg Cpdr (Lansoprazole) .... Take one by mouth daily 7)  Paxil 20 Mg Tabs (Paroxetine hcl) .... Take one by mouth daily 8)  Multivitamins Caps (Multiple vitamin) .... Take one by mouth daily 9)  Flunisolide 29 Mcg/act Soln (Flunisolide) .... Two sprays/nostril daily 10)  Trazodone Hcl 100 Mg Tabs (Trazodone hcl) .... Take 1 tablet by mouth once a day 11)  Cinnamon 500 Mg Caps (Cinnamon) .... Two times a day 12)  Bl Flax Seed Oil 1000 Mg Caps (Flaxseed (linseed)) .... Take one tablet by mouth three times a day 13)  Piroxicam 20 Mg Caps (Piroxicam) .... Take 1 tablet by mouth once a day as needed joint pain     Prescriptions: PREVACID 30 MG CPDR (LANSOPRAZOLE) Take one by mouth daily  #90 x 0   Entered and Authorized by:   Nani Gasser MD   Signed by:   Nani Gasser MD on 04/16/2007   Method used:   Electronically sent to ...       [facility]  [address]*       [address] [place], [place]  [address]       Ph: [phone]       Fax: [phone]   RxID:   [id number] ZOCOR 20 MG TABS (SIMVASTATIN) Take one tablet by mouth daily  #90 x 3   Entered and Authorized by:   Nani Gasser MD   Signed by:   Nani Gasser MD on 04/16/2007   Method used:   Electronically sent to ...       [facility]  [address] [place], [place]  [address]       Ph: [phone]       Fax: [phone]   RxID:   [id number] PAXIL 20 MG TABS (PAROXETINE HCL) Take one by mouth daily  #30 Each x 2   Entered and Authorized by:   Nani Gasser MD   Signed by:   Nani Gasser MD on 04/16/2007   Method used:   Electronically sent to ...       [facility]  [address]       [address] [place], [place]  [address]       Ph: [phone]       Fax: [phone]   RxID:   [id number] TRAZODONE HCL 100 MG TABS (TRAZODONE HCL) Take 1 tablet by mouth once a day  #30 x 2   Entered and  Authorized by:   Nani Gasser MD   Signed by:   Nani Gasser MD  on 04/16/2007   Method used:   Electronically sent to ...       [facility]  [address]       [address] [place], [place]  [address]       Ph: [phone]       Fax: [phone]   RxID:   [id number] PIROXICAM 20 MG  CAPS (PIROXICAM) Take 1 tablet by mouth once a day as needed joint pain  #30 x 2   Entered and Authorized by:   Nani Gasser MD   Signed by:   Nani Gasser MD on 04/16/2007   Method used:   Electronically sent to ...       [facility]  [address]*       [address] [place], [place]  [address]       Ph: [phone]       Fax: [phone]   RxID:   [id number] TRAMADOL HCL 50 MG  TABS (TRAMADOL HCL) one by mouth every 6 hours as needed pain  #60 x 1   Entered and Authorized by:   Nani Gasser MD   Signed by:   Nani Gasser MD on 04/16/2007   Method used:   Electronically sent to ...       [facility]  [address]       [address] [place], [place]  [address]       Ph: [phone]       Fax: [phone]   RxID:   [id number]  ] Laboratory Results   Urine Tests  Date/Time Recieved: 04/16/07 @ 4:16pm Date/Time Reported: 04/16/07 @ 4:16pm  Routine Urinalysis   Color: lt. yellow Appearance: Clear Glucose: negative   (Normal Range: Negative) Bilirubin: negative   (Normal Range: Negative) Ketone: negative   (Normal Range: Negative) Spec. Gravity: 1.010   (Normal Range: 1.003-1.035) Blood: trace-lysed   (Normal Range: Negative) pH: 7.0   (Normal Range: 5.0-8.0) Protein: negative   (Normal Range: Negative) Urobilinogen: 0.2   (Normal Range: 0-1) Nitrite: negative   (Normal Range: Negative) Leukocyte Esterace: trace   (Normal Range: Negative)

## 2010-03-09 NOTE — Procedures (Signed)
Summary: EGD   EGD  Procedure date:  10/06/2000  Findings:      Location: [facility]  GERD  Patient Name: Cheryl, [name] MRN: [id number] Procedure Procedures: Panendoscopy (EGD) CPT: 43235.  Personnel: Endoscopist: Wilhemina Bonito. Marina Goodell, MD.  Referred By: Rene Paci, MD.  Exam Location: Exam performed in Endoscopy Suite.  Patient Consent: Procedure, Alternatives, Risks and Benefits discussed, consent obtained,  Indications Symptoms: Dysphagia. Chest Pain. Reflux symptoms  History  Pre-Exam Physical: Performed Oct 06, 2000  Cardio-pulmonary exam, HEENT exam, Abdominal exam, Extremity exam, Neurological exam, Mental status exam WNL.  Exam Exam Info: Maximum depth of insertion Duodenum, intended Duodenum. Patient position: on left side. Vocal cords visualized. Gastric retroflexion performed. Images taken. ASA Classification: I. Tolerance: excellent.  Sedation Meds: Residual sedation present from prior procedure today.  Monitoring: BP and pulse monitoring done. Oximetry used. Supplemental O2 given  Fluoroscopy: Fluoroscopy was not used.  Findings - Normal: Proximal Esophagus to Distal Esophagus. Not Seen: Tumor. Barrett's esophagus. Esophageal inflammation. Mucosal abnormality. obvious submucosal mass.    Comments: NORMAL EGD. NOCAUSE FOR PAIN FOUND Assessment Normal examination.  Diagnoses: 530.81: GERD.   Events  Unplanned Intervention: No unplanned interventions were required.  Unplanned Events: There were no complications. Plans Medication(s): Referring provider to order medications.  Disposition: After procedure patient sent to recovery. After recovery patient sent home.  Scheduling: Clinic Visit, to Rene Paci, MD, in next few weeks    This report was created from the original endoscopy report, which was reviewed and signed by the above listed endoscopist.   cc:  Rene Paci, MD

## 2010-03-09 NOTE — Progress Notes (Signed)
Summary: results  Phone Note Call from Patient Call back at Home Phone [phone]   Caller: Patient Call For: PERRY  Reason for Call: Talk to Nurse Details for Reason: results Summary of Call: would like results from MRI before the end of the day It will drive pt crazy if she does NOT know  Initial call taken by: Guadlupe Spanish [facility],  January 25, 2008 8:46 AM  Follow-up for Phone Call        Pt. ntfd.of MRI results Follow-up by: Teryl Lucy RN,  January 25, 2008 9:52 AM

## 2010-03-09 NOTE — Assessment & Plan Note (Signed)
Summary: [facility] Annual Wellness   Vital Signs:  Patient profile:   70 year old female Height:      64.5 inches Weight:      172 pounds BMI:     29.17 Pulse rate:   80 / minute BP sitting:   114 / 58  (right arm) Cuff size:   regular  Vitals Entered By: Avon Gully CMA, Duncan Dull) (November 27, 2009 10:22 AM) CC: CPE wants to increase tramadol to 100mg  and wants to double dose of her Amitiza  Vision Screening:Left eye with correction: 20 / 40 Right eye with correction: 20 / 25 Both eyes with correction: 20 / 25        Vision Entered By: Avon Gully CMA, Duncan Dull) (November 27, 2009 11:27 AM)  Hearing Screen  20db HL: Left  500 hz: No Response 1000 hz: No Response 2000 hz: 20db 4000 hz: No Response Right  500 hz: No Response 1000 hz: No Response 2000 hz: 20db 4000 hz: No Response   Hearing Testing Entered By: Avon Gully CMA, (AAMA) (November 27, 2009 11:09 AM) 25db HL: Left  500 hz: No Response 1000 hz: No Response 2000 hz: 25db 4000 hz: No Response Right  500 hz: No Response 1000 hz: No Response 2000 hz: 25db 4000 hz: No Response  40db HL: Left  500 hz: No Response 1000 hz: 40db 2000 hz: 40db 4000 hz: 40db Right  500 hz: No Response 1000 hz: 40db 2000 hz: 40db 4000 hz: 40db    Primary Care Provider:  Alden Hipp, MD  CC:  CPE wants to increase tramadol to 100mg  and wants to double dose of her Amitiza.  History of Present Illness: See scanned form.   Current Medications (verified): 1)  Aspir-Low 81 Mg Tbec (Aspirin) .... Take One Tablet Daily 2)  Calcium-Vitamin D 250-125 Mg-Unit Tabs (Calcium Carbonate-Vitamin D) .... Take One Twice A Day 3)  Zocor 20 Mg Tabs (Simvastatin) .... Take One Tablet By Mouth Daily 4)  Xanax 0.5 Mg Tabs (Alprazolam) .... Take 1 Tablet By Mouth Two Times A Day As Needed 5)  Multivitamins  Caps (Multiple Vitamin) .... Take One By Mouth Daily 6)  Bl Flax Seed Oil 1000 Mg  Caps (Flaxseed (Linseed)) .... Take  One Tablet By Mouth Three Times A Day 7)  Miralax   Powd (Polyethylene Glycol 3350) .... One Capful Once Daily As Needed Constipation 8)  Stool Softener 100 Mg Caps (Docusate Sodium) .Marland Kitchen.. 1 By Mouth Qlunchtime and 2 By Mouth At Bedtime 9)  Vitamin D3 1000 Unit Tabs (Cholecalciferol) .Marland Kitchen.. 1 By Mouth Once Daily 10)  Paroxetine Hcl 20 Mg Tabs (Paroxetine Hcl) .... Take 1 Tablet By Mouth Once A Day 11)  Trazodone Hcl 50 Mg Tabs (Trazodone Hcl) .... 1/2-1 Tab By Mouth Once A Day At Bedtime. 12)  Tramadol Hcl 50 Mg Tabs (Tramadol Hcl) .... Take 1 Tablet By Mouth Three Times A Day As Needed 13)  Omeprazole 20 Mg Cpdr (Omeprazole) .... Take One Capsule By Mouth Twice A Day 14)  Amitiza 8 Mcg Caps (Lubiprostone) .... Take 1 Tablet By Mouth Two Times A Day  Allergies (verified): 1)  ! Codeine Sulfate (Codeine Sulfate) 2)  ! Sulfa 3)  ! Prozac  Comments:  Nurse/Medical Assistant: The patient's medications and allergies were reviewed with the patient and were updated in the Medication and Allergy Lists. Avon Gully CMA, Duncan Dull) (November 27, 2009 10:27 AM)  Past History:  Past Medical History: Last updated: 04/06/2008 3 bulging disks,  mild spinal stenosis,  L4 affected Hx of Diverticuli, colon,  Lung nodules, calcified granulomas, chronic ABIs were normal at screen done on 03/31/06 Arthritis Depression/Anxiety GERD Irritable Bowel Syndrome uterine cancer s/p hysterectomy  Past Surgical History: Last updated: 12/15/2008 Appendectomy  1960s,  EGD/colonoscopy-normal in2002,  Hysterectomy 1973, s/p utereine Ca tonsilectomy as a child Left shoulder surgery - Dr. Lajoyce Corners.  bilat cataracts.   Family History: Last updated: 03/16/2009 Alcoholism, heart disease-Father, Brother Mother had RA, kidney failure Father had psoriasis, lung cancer Son with Lung Ca, HIV  Matt deceased fall 07/05/07 Family History of Crohn's: Brother Therapist, art with SLE and CHF.   Social History: Last updated:  04/06/2008 12 yrs education. currently separated from husband Elijah Birk (who was incarcarated)  4 adult chldren of which 3 are living.  Quit smoking 1995, no drugs, no EtOH, approximately 3 caffeinated drinks per day, Stretches daily and goes for walks.  active in her church.  used to work in book keeping  Physical Exam  General:  Well-developed,well-nourished,in no acute distress; alert,appropriate and cooperative throughout examination Head:  Normocephalic and atraumatic without obvious abnormalities. No apparent alopecia or balding. Eyes:  No corneal or conjunctival inflammation noted. EOMI. Perrla.  Ears:  External ear exam shows no significant lesions or deformities.  Otoscopic examination reveals clear canals, tympanic membranes are intact bilaterally without bulging, retraction, inflammation or discharge. Hearing is grossly normal bilaterally. Nose:  External nasal examination shows no deformity or inflammation.  Mouth:  Oral mucosa and oropharynx without lesions or exudates.  Neck:  No deformities, masses, or tenderness noted. Chest Wall:  No deformities, masses, or tenderness noted. Breasts:  No mass, nodules, thickening, tenderness, bulging, retraction, inflamation, nipple discharge or skin changes noted.   Lungs:  Normal respiratory effort, chest expands symmetrically. Lungs are clear to auscultation, no crackles or wheezes. Heart:  Normal rate and regular rhythm. S1 and S2 normal without gallop, murmur, click, rub or other extra sounds. Abdomen:  Bowel sounds positive,abdomen soft and non-tender without masses, organomegaly or hernias noted. Msk:  No deformity or scoliosis noted of thoracic or lumbar spine.   Pulses:  R and L carotid,radial,dorsalis pedis and posterior tibial pulses are full and equal bilaterally Extremities:  No clubbing, cyanosis, edema, or deformity noted with normal full range of motion of all joints.   Neurologic:  No cranial nerve deficits noted. Station and gait are  normal.  DTRs are symmetrical throughout. Sensory, motor and coordinative functions appear intact. Skin:  no rashes.  Thickend white nail  on her left hand. Some erythema at the base of the nail. NO acitve sign of infection.  Cervical Nodes:  No lymphadenopathy noted Axillary Nodes:  No palpable lymphadenopathy Psych:  Cognition and judgment appear intact. Alert and cooperative with normal attention span and concentration. No apparent delusions, illusions, hallucinations   Impression & Recommendations:  Problem # 1:  EXAMINATION, ROUTINE MEDICAL (ICD-V70.0)  Orders: T-Comprehensive Metabolic Panel ([id number]) T-Lipid Profile (40102-72536) [facility] -Subsequent Annual Wellness Visit [phone])  Complete Medication List: 1)  Aspir-low 81 Mg Tbec (Aspirin) .... Take one tablet daily 2)  Calcium-vitamin D 250-125 Mg-unit Tabs (Calcium carbonate-vitamin d) .... Take one twice a day 3)  Zocor 20 Mg Tabs (Simvastatin) .... Take one tablet by mouth daily 4)  Xanax 0.5 Mg Tabs (Alprazolam) .... Take 1 tablet by mouth two times a day as needed 5)  Multivitamins Caps (Multiple vitamin) .... Take one by mouth daily 6)  Bl Flax Seed Oil 1000 Mg Caps (Flaxseed (linseed)) .Marland KitchenMarland KitchenMarland Kitchen  Take one tablet by mouth three times a day 7)  Miralax Powd (Polyethylene glycol 3350) .... One capful once daily as needed constipation 8)  Stool Softener 100 Mg Caps (Docusate sodium) .Marland Kitchen.. 1 by mouth qlunchtime and 2 by mouth at bedtime 9)  Vitamin D3 1000 Unit Tabs (Cholecalciferol) .Marland Kitchen.. 1 by mouth once daily 10)  Paroxetine Hcl 20 Mg Tabs (Paroxetine hcl) .... Take 1 tablet by mouth once a day 11)  Trazodone Hcl 50 Mg Tabs (Trazodone hcl) .... 1/2-1 tab by mouth once a day at bedtime. 12)  Tramadol Hcl 50 Mg Tabs (Tramadol hcl) .... Take 1-2  tablet by mouth three times a day as needed 13)  Omeprazole 20 Mg Cpdr (Omeprazole) .... Take one capsule by mouth twice a day 14)  Amitiza 8 Mcg Caps (Lubiprostone) .... 2 tabs by mouth two  times a day  Other Orders: T-Dual DXA Bone Density/ Axial (81191) Influenza Vaccine MCR (47829) Zoster (Shingles) Vaccine Live (56213) Admin 1st Vaccine (90471) Fingerstick (36416) Hgb A1C (08657QI) T-Culture, Fungus w/o Smear (69629-52841) Prescriptions: TRAMADOL HCL 50 MG TABS (TRAMADOL HCL) Take 1-2  tablet by mouth three times a day as needed  #120 x 0   Entered and Authorized by:   Nani Gasser MD   Signed by:   Nani Gasser MD on 11/27/2009   Method used:   Electronically to        [facility] [phone]* (retail)       [address] [place], [place]  [address]       Ph: [phone]       Fax: [phone]   RxID:   [id number] AMITIZA 8 MCG CAPS (LUBIPROSTONE) 2 tabs by mouth two times a day  #120 x 3   Entered and Authorized by:   Nani Gasser MD   Signed by:   Nani Gasser MD on 11/27/2009   Method used:   Electronically to        [facility] [phone]* (retail)       [address] [place], [place]  [address]       Ph: [phone]       Fax: [phone]   RxID:   [id number] AMITIZA 24 MCG CAPS (LUBIPROSTONE) Take 1 tablet by mouth two times a day  #60 x 3   Entered and Authorized by:   Nani Gasser MD   Signed by:   Nani Gasser MD on 11/27/2009   Method used:   Electronically to        [facility] [phone]* (retail)       [address] [place], [place]  [address]       Ph: [phone]       Fax: [phone]   RxID:   [id number]    Orders Added: 1)  T-Comprehensive Metabolic Panel [80053-22900] 2)  T-Lipid Profile [phone] 3)  T-Dual DXA Bone Density/ Axial [77080] 4)  Influenza Vaccine MCR [00025] 5)  Zoster (Shingles) Vaccine Live [90736] 6)  Admin 1st Vaccine [90471] 7)  Fingerstick [36416] 8)  Hgb A1C [83036QW] 9)  T-Culture, Fungus w/o Smear [53614-43154] 10)  [facility] -Subsequent Annual Wellness Visit [G0439]   Immunizations Administered:  Influenza Vaccine # 1:    Vaccine Type:  Fluvax MCR    Site: right deltoid    Mfr: GlaxoSmithKline    Dose: 0.5 ml  Route: IM    Given by: Sue Lush McCrimmon CMA, (AAMA)    Exp. Date: 08/07/2010    Lot #: [id number]    VIS given: 09/01/09 version given November 27, 2009.  Zostavax # 1:    Vaccine Type: Zostavax    Site: left deltoid    Mfr: GlaxoSmithKline    Dose: 0.5 ml    Route: IM    Given by: Sue Lush McCrimmon CMA, (AAMA)    Exp. Date: 09/25/2010    Lot #: [id number]    VIS given: 11/19/04 given November 27, 2009.  Flu Vaccine Consent Questions:    Do you have a history of severe allergic reactions to this vaccine? no    Any prior history of allergic reactions to egg and/or gelatin? no    Do you have a sensitivity to the preservative Thimersol? no    Do you have a past history of Guillan-Barre Syndrome? no    Do you currently have an acute febrile illness? no    Have you ever had a severe reaction to latex? no    Vaccine information given and explained to patient? no    Are you currently pregnant? no   Immunizations Administered:  Influenza Vaccine # 1:    Vaccine Type: Fluvax MCR    Site: right deltoid    Mfr: GlaxoSmithKline    Dose: 0.5 ml    Route: IM    Given by: Sue Lush McCrimmon CMA, (AAMA)    Exp. Date: 08/07/2010    Lot #: [id number]    VIS given: 09/01/09 version given November 27, 2009.  Zostavax # 1:    Vaccine Type: Zostavax    Site: left deltoid    Mfr: GlaxoSmithKline    Dose: 0.5 ml    Route: IM    Given by: Sue Lush McCrimmon CMA, (AAMA)    Exp. Date: 09/25/2010    Lot #: [id number]    VIS given: 11/19/04 given November 27, 2009.  Last Mammogram:  Normal (12/10/2008 3:31:41 PM) Mammogram Next Due:  2 yr    Laboratory Results   Blood Tests   Date/Time Received: 11/27/09 Date/Time Reported: 11/27/09  HGBA1C: 5.7%   (Normal Range: Non-Diabetic - 3-6%   Control Diabetic - 6-8%)

## 2010-03-09 NOTE — Progress Notes (Signed)
Summary: Med. Question  Phone Note Call from Patient Call back at Home Phone [phone]   Caller: Patient Call For: Nani Gasser MD Reason for Call: Talk to Doctor Summary of Call: Pt. called and was under the impression that you wanted her to stop taking her Seroquel 25mg  and all she has left is a wks. worth. Pt. was wondering how you wanted her to taper off the medication. Initial call taken by: Larena Sox  Follow-up for Phone Call        If med is helping then recommend continue for another 2-3 months. If not we can taper every other night and then stop .  Follow-up by: Nani Gasser MD,  January 22, 2008 9:15 AM  Additional Follow-up for Phone Call Additional follow up Details #1::        Pt. informed and she will stop theTrazodone for a couple nights and just use the Seroquel. Pt. will call and let us know which med. she prefers. Additional Follow-up by: Larena Sox

## 2010-03-09 NOTE — Progress Notes (Signed)
Summary: Nuc pre-Procedure  Phone Note Outgoing Call Call back at [facility] Phone [phone]   Call placed by: Irean Hong, RN,  April 16, 2008 2:39 PM Summary of Call: Left Myoview instructions on home answer machine(see scanned Myoview Information Sheet) Patient called back and  instructions given.       Nuclear Med Background Indications for Stress Test: Evaluation for Ischemia, Post Hospital Indications Comments: 04/06/08 Post hosp. CP/SOB/Dizziness/Nausea History: Echo, GXT History Comments: '02 Echo: EF55%,? severe MR '02 GXT: ok per patient Symptoms: Chest Tightness, Dizziness, Nausea, SOB  Nuclear Pre-Procedure Height (in): 64.5 in-lbs

## 2010-03-09 NOTE — Procedures (Signed)
Summary: colonoscopy   Colonoscopy  Procedure date:  10/06/2000  Findings:      Results: Hemorrhoids.     Results: Diverticulosis.       Location:  [facility].   Patient Name: Cheryl [name], Cheryl [name] MRN: [id number] Procedure Procedures: Panendoscopy (EGD) CPT: 43235.  Personnel: Endoscopist: Wilhemina Bonito. Marina Goodell, MD.  Referred By: Rene Paci, MD.  Exam Location: Exam performed in Endoscopy Suite.  Patient Consent: Procedure, Alternatives, Risks and Benefits discussed, consent obtained,  Indications Symptoms: Dysphagia. Chest Pain. Reflux symptoms  History  Pre-Exam Physical: Performed Oct 06, 2000  Cardio-pulmonary exam, HEENT exam, Abdominal exam, Extremity exam, Neurological exam, Mental status exam WNL.  Exam Exam Info: Maximum depth of insertion Duodenum, intended Duodenum. Patient position: on left side. Vocal cords visualized. Gastric retroflexion performed. Images taken. ASA Classification: I. Tolerance: excellent.  Sedation Meds: Residual sedation present from prior procedure today.  Monitoring: BP and pulse monitoring done. Oximetry used. Supplemental O2 given  Fluoroscopy: Fluoroscopy was not used.  Findings - Normal: Proximal Esophagus to Distal Esophagus. Not Seen: Tumor. Barrett's esophagus. Esophageal inflammation. Mucosal abnormality. obvious submucosal mass.    Comments: NORMAL EGD. NOCAUSE FOR PAIN FOUND Assessment Normal examination.  Diagnoses: 530.81: GERD.   Events  Unplanned Intervention: No unplanned interventions were required.  Unplanned Events: There were no complications. Plans Medication(s): Referring provider to order medications.  Disposition: After procedure patient sent to recovery. After recovery patient sent home.  Scheduling: Clinic Visit, to Rene Paci, MD, in next few weeks    This report was created from the original endoscopy report, which was reviewed and signed by the above listed endoscopist.    cc:  Rene Paci, MD

## 2010-03-09 NOTE — Progress Notes (Signed)
Summary: Medicare Preventive Physical Exam Forms  Medicare Preventive Physical Exam Forms   Imported By: Sherian Rein 12/09/2009 07:22:04  _____________________________________________________________________  External Attachment:    Type:   Image     Comment:   External Document

## 2010-03-09 NOTE — Progress Notes (Signed)
Summary: Insurance question  Phone Note Call from Patient Call back at [employer] [phone]   Caller: Patient Call For: Kathlene November Reason for Call: Talk to Nurse Summary of Call: Pt. called stating that her lipid panel that was [facility] at [facility] on 12/17/07 had the wrong diagnosis code on it and her insurance co. won't pay for it. They said if you call them with the correct code they will be happy to pay for it. Pt. Acct. # [id number].  Initial call taken by: Larena Sox  Follow-up for Phone Call        Can resubmit under 272.4.  I previously submitted it under a CPE but she now has medicare.  Follow-up by: Nani Gasser MD,  January 23, 2008 8:14 AM  Additional Follow-up for Phone Call Additional follow up Details #1::        Called [facility] and spoke with Irma and code submiited per Dr. Linford Arnold and they will re-file. Additional Follow-up by: Kathlene November,  January 23, 2008 9:03 AM

## 2010-03-09 NOTE — Miscellaneous (Signed)
Summary: Stable granulomatous dz  Clinical Lists Changes  Observations: Added new observation of CXR RESULTS: Stable from 01/25/04 for old granulomatous disease and chronic bronchitic changes.   (01/23/2006 15:12)     CXR  Procedure date:  01/23/2006  Findings:      Stable from 01/25/04 for old granulomatous disease and chronic bronchitic changes.

## 2010-03-09 NOTE — Assessment & Plan Note (Signed)
Summary: Splinter removal, GERD, FAtigue, etc   Vital Signs:  Patient profile:   70 year old female Height:      64.5 inches Weight:      195 pounds Temp:     97.9 degrees F oral Pulse rate:   73 / minute BP sitting:   103 / 63  (left arm) Cuff size:   regular  Vitals Entered By: Payton Spark CMA (May 23, 2008 9:51 AM) CC: 6 WK FU/SPLINTER IN R 4TH DIGIT X 1 WEEK Pain Assessment Patient in pain? yes        History of Present Illness: Here to fu wood splinter. Would like it removed. Has been there for 1 weeks. Very tender. No drainage.    Currently on ometprazole ad has noticed some improved.  Had really bad reflux about 2 weeks ago. Taking ranitidine as needed duringe the day.   Lost her son recently. Has been very stresses.  Having aching muscles. Complains of diffivulty losing weight.  Says has tried to cut out her carbs and did lose a few pounds initially but now has gained it back. She is still not sleeping well and feels tireed and exhuasted all the time. Having cold and hot spells that seem abnormal to her.  Her hair is dry and birttle. Says her feet constantly stay cold. Feels she has lost some muscle mass and has sugar cravins.    Back pain is not any better. Still gets radiation to her feet occassionally.  Went to 6 seesions fo the rehab and was making her feel worse. Still having sever low back pain.  Had an MRI about 6 years ago and was told had some OA and stenosis at that time.    Current Medications (verified): 1)  Aspir-Low 81 Mg Tbec (Aspirin) .... Take One Tablet Daily 2)  Calcium-Vitamin D 250-125 Mg-Unit Tabs (Calcium Carbonate-Vitamin D) .... Take One Twice A Day 3)  Zocor 20 Mg Tabs (Simvastatin) .... Take One Tablet By Mouth Daily 4)  Xanax 0.5 Mg Tabs (Alprazolam) .... Take 1 Tablet By Mouth Two Times A Day As Needed 5)  Omeprazole 20 Mg Tbec (Omeprazole) .... Take 1 Tablet By Mouth Once A Day Before Breakfast. 6)  Multivitamins  Caps (Multiple Vitamin)  .... Take One By Mouth Daily 7)  Flunisolide 29 Mcg/act Soln (Flunisolide) .... Two Sprays/nostril Daily 8)  Trazodone Hcl 50 Mg Tabs (Trazodone Hcl) .Marland Kitchen.. 1-2 By Mouth At Bedtime As Needed Sleep 9)  Bl Flax Seed Oil 1000 Mg  Caps (Flaxseed (Linseed)) .... Take One Tablet By Mouth Three Times A Day 10)  Miralax   Powd (Polyethylene Glycol 3350) .... One Capful Once Daily As Needed Constipation 11)  Stool Softener 100 Mg Caps (Docusate Sodium) .Marland Kitchen.. 1 By Mouth Qlunchtime and 2 By Mouth At Bedtime 12)  Vitamin D3 1000 Unit Tabs (Cholecalciferol) .Marland Kitchen.. 1 By Mouth Once Daily 13)  Claritin 10 Mg Tabs (Loratadine) .Marland Kitchen.. 1 By Mouth Once Daily 14)  Tens Unit .... As Directed  Allergies (verified): 1)  ! Codeine Sulfate (Codeine Sulfate) 2)  ! Sulfa 3)  ! Prozac  Comments:  Nurse/Medical Assistant: Payton Spark CMA (May 23, 2008 9:52 AM) The patient's medications were reviewed with the patient and were updated in the Medication List.  Physical Exam  General:  Well-developed,well-nourished,in no acute distress; alert,appropriate and cooperative throughout examination Skin:  Right 4th digit. Wound opened with a No. 11 baldea nd the splinter was removed with forcepts. Triple abx ointment  place and bandaged.    Impression & Recommendations:  Problem # 1:  GASTROESOPHAGEAL REFLUX, NO ESOPHAGITIS (ICD-530.81) Increase to two times a day and can use TUMS as needed.  IF still not helping then can switch to a different PPI.  30 min spent face to face and over half the time spent in cousneling.  Her updated medication list for this problem includes:    Omeprazole 20 Mg Tbec (Omeprazole) .Marland Kitchen... Take 1 tablet by mouth two times a day  Problem # 2:  LOW BACK PAIN (ICD-724.2) Last MRI was in 2005/6.  Will set up for further evaluation with further MRI or refer to orthopedist. Pt to think about her options and let me know.  Her updated medication list for this problem includes:    Aspir-low 81 Mg Tbec  (Aspirin) .Marland Kitchen... Take one tablet daily  Problem # 3:  FINGER SUP FB W/O MAJ OPEN WOUND&W/O MENTION INF (ICD-915.6) Splinter removed. WAsh with soap and water.  Problem # 4:  FATIGUE (ICD-780.79) Will rule out other abnormalities.  ON teh questionnaire she completed she scored high on excess cortisol (likely from her increased stress levels) and low on her thyroid.  Her last TSH in August was normal. Will recheck today and if normal stil consider a low dose compounded thyroid supplement.  Orders: T-Vitamin B12 [phone]) T-Vitamin D (25-Hydroxy) [phone]) T-Folic Acid, Serum [phone]) T-TSH [phone]) T-CBC No Diff ([id number])  Complete Medication List: 1)  Aspir-low 81 Mg Tbec (Aspirin) .... Take one tablet daily 2)  Calcium-vitamin D 250-125 Mg-unit Tabs (Calcium carbonate-vitamin d) .... Take one twice a day 3)  Zocor 20 Mg Tabs (Simvastatin) .... Take one tablet by mouth daily 4)  Xanax 0.5 Mg Tabs (Alprazolam) .... Take 1 tablet by mouth two times a day as needed 5)  Omeprazole 20 Mg Tbec (Omeprazole) .... Take 1 tablet by mouth two times a day 6)  Multivitamins Caps (Multiple vitamin) .... Take one by mouth daily 7)  Flunisolide 29 Mcg/act Soln (Flunisolide) .... Two sprays/nostril daily 8)  Trazodone Hcl 50 Mg Tabs (Trazodone hcl) .Marland Kitchen.. 1-2 by mouth at bedtime as needed sleep 9)  Bl Flax Seed Oil 1000 Mg Caps (Flaxseed (linseed)) .... Take one tablet by mouth three times a day 10)  Miralax Powd (Polyethylene glycol 3350) .... One capful once daily as needed constipation 11)  Stool Softener 100 Mg Caps (Docusate sodium) .Marland Kitchen.. 1 by mouth qlunchtime and 2 by mouth at bedtime 12)  Vitamin D3 1000 Unit Tabs (Cholecalciferol) .Marland Kitchen.. 1 by mouth once daily 13)  Claritin 10 Mg Tabs (Loratadine) .Marland Kitchen.. 1 by mouth once daily 14)  Tens Unit  .... As directed Prescriptions: OMEPRAZOLE 20 MG TBEC (OMEPRAZOLE) Take 1 tablet by mouth two times a day  #60 x 2   Entered and Authorized by:    Nani Gasser MD   Signed by:   Nani Gasser MD on 05/23/2008   Method used:   Electronically to        [facility] [phone]* (retail)       [address] [place], [place]  [address]       Ph: [phone]       Fax: [phone]   RxID:   [id number]

## 2010-03-09 NOTE — Progress Notes (Signed)
Summary: Feeling better  Phone Note Call from Patient Call back at Home Phone [phone]   Caller: Patient Call For: Nani Gasser MD Summary of Call: Pt calls and states you told her tro call with update on her mood and she says she feels good right now and thinks just getting her something to help her sleep helped alot. Just FYI Initial call taken by: Kathlene November,  October 20, 2008 3:55 PM

## 2010-03-09 NOTE — Assessment & Plan Note (Signed)
Summary: INFECTED MOLE REMOVAL SITE/BRITTANY   Vital Signs:  Patient Profile:   70 Years Old Female Height:     64.5 inches Temp:     97.4 degrees F oral Pulse rate:   81 / minute BP sitting:   112 / 59  (left arm) Cuff size:   regular  Vitals Entered By: Harlene Salts (January 02, 2006 3:22 PM)            PCP:  Cipriano Bunker  Chief Complaint:  c/o swelling and pain on lower left leg.  History of Present Illness: Area on left leg s/p biopsy is 1cm round.  Has 2-1/2 cm round erytheams around biopsy site.  No drainage.  Tender to touch.    Physical Exam  General:     See HPI   Impression & Recommendations:  Problem # 1:  INFECTION, LOCAL SKIN/SUBCUTANEOUS TISSUE NOS (ICD-686.9) Not healing well.  Use ABX ointment and band-aid no peroxide.  Start Keflex for 7 days.  F/U if not improving.  Orders: Est. Patient Level II (47829)   Medications Added to Medication List This Visit: 1)  Keflex 500 Mg Caps (Cephalexin) .... Take 1 tablet by mouth three times a day

## 2010-03-09 NOTE — Assessment & Plan Note (Signed)
Summary: WOUND IN KNEE IS INFECTED   Vital Signs:  Patient Profile:   70 Years Old Female Height:     64.5 inches Pulse rate:   78 / minute BP sitting:   120 / 70  (left arm)  Vitals Entered By: Edmonia Lynch (January 16, 2006 3:53 PM)              Prior Medications: ASPIR-LOW 81 MG TBEC (ASPIRIN) Take one tablet daily CALCIUM-VITAMIN D 250-125 MG-UNIT TABS (CALCIUM CARBONATE-VITAMIN D) Take one twice a day DARVOCET-N 100  TABS (PROPOXYPHENE N-APAP TABS) Take tablet by mouth as needed ZOCOR 20 MG TABS (SIMVASTATIN) Take one tablet by mouth daily XANAX 0.5 MG TABS (ALPRAZOLAM) Take one tablet by mouth at bedtime as needed PREVACID 30 MG CPDR (LANSOPRAZOLE) Take one by mouth daily PAXIL 20 MG TABS (PAROXETINE HCL) Take one by mouth daily MULTIVITAMINS  CAPS (MULTIPLE VITAMIN) Take one by mouth daily FLONASE 50 MCG/ACT SUSP (FLUTICASONE PROPIONATE) 2 sprays each nostril daily TRAZODONE HCL 100 MG TABS (TRAZODONE HCL) Take 1/4 tablet at bedtime as needed CLARITIN 10 MG TABS (LORATADINE) Take 1 tablet by mouth once a day MAGNESIUM SULFATE  CAPS (MAGNESIUM CAPS) two times a day CHROMIUM PICOLINATE 200 MCG TABS (CHROMIUM PICOLINATE) Take 1 tablet by mouth once a day CINNAMON 500 MG CAPS (CINNAMON) two times a day Current Allergies: ! CODEINE SULFATE (CODEINE SULFATE) ! SULFA ! PROZAC PCP:  Cipriano Bunker  Chief Complaint:  C/O wound on leg /infected ?Marland Kitchen  History of Present Illness: See previous note.  Wound started after pt came in for shave bx of seb keratosis after having hit the lesion with her razor that morning.  Then 4 days ago noticed tenderness in area that was healing.  Bandaid had discharge on it today.  Mild tenderness today.  Cleaning with soap and water and using Neosporin on wound.   Physical Exam  General:     Well-developed,well-nourished,in no acute distress; alert,appropriate and cooperative throughout examination Skin:     Left leg approx mid tibia, laterally  approx  1cm round lesion with white firm center and slight duskly appearance around raised edges.  No erthema, no drainage currently.     Impression & Recommendations:  Problem # 1:  ULCER, LEG (ICD-707.10) Suspect difficulty healing secondary to edema in leg.  Take 1/2 tab Lasix for 2 days.  Elevate leg.  Keep compression  bandage in place for 4-5 days.  Wrapped with Cobane dressing here today.  Restart Kelfex three times a day for one week. F/U one week to make sure healing well.    Medications Added to Medication List This Visit: 1)  Keflex 500 Mg Caps (Cephalexin) .... Take 1 tablet by mouth three times a day  Patient Instructions: 1)  Please schedule a follow-up appointment in 1 weeks for leg ulcer.  Elevate leg and can take half tab of 20mg  Lasix.

## 2010-03-09 NOTE — Assessment & Plan Note (Signed)
Summary: CPE, flu shot   Vital Signs:  Patient Profile:   70 Years Old Female Height:     64.5 inches Weight:      189 pounds Pulse rate:   68 / minute BP sitting:   110 / 59  (left arm) Cuff size:   regular  Vitals Entered By: Drue Stager RN (November 24, 2006 9:02 AM)                 PCP:  Cipriano Bunker  Chief Complaint:  CPE.  ADRESS QUESTIONS ON SUPER CLEANSE PILLS, . NEEDS SEVERAL REFILLS- SIMVANSTATIN, TRAZADONE, AND PREVACID. NEEDS MAMMOGRAM, and .. AND HAS QUESTION  ON STATINS/CANCER.Marland Kitchen  History of Present Illness: Pain in hands, wrists, and elbows. Pain in PIPs adn MTPs.  Mother had RA.  No redness or swelling.  N hx of psoriasis.  Wrose when damp or cold or very humid.  No meds besides Darvocet.  Father had psoriatic arthtrtis.    Wants flu shot today.  Went to ortho yesterday and had a cortisone injection into her right shoulder.  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   Questions about statins causing cancer. She has heard about this in the new.   Current Allergies: ! CODEINE SULFATE (CODEINE SULFATE) ! SULFA ! PROZAC  Past Medical History:    Reviewed history from 08/25/2006 and no changes required:       3 bulging disks, mild spinal stenosis,  L4 affected       Hx of Diverticuli, colon,        Lung nodules, calcified granulomas,        ABIs were normal at screen done on 03/31/06  Past Surgical History:    Appendectomy  1960s,     EGD/colonoscopy-normal in2002,     Hysterectomy 1973   Family History:    Alcoholism-Father, Brother    Mother had RA, kidney failure    Father had psoriasis  Social History:    12 yrs education. Married to [place] who is incarcerated.  4 adult chldren.  Quit smoking 1995, no drugs, no EtOH, 3 caffeinated drinks per day, no regular exercise   Risk Factors:  Caffeine use:  3 drinks per day Exercise:  yes    Type:  Walking, stretching  Family History Risk Factors:    Family History of MI in females < 64 years old:   no    Family History of MI in males < 46 years old:  no   Review of Systems  The patient denies fever, weight loss, vision loss, chest pain, syncope, dyspnea on exhertion, abdominal pain, melena, hematochezia, depression, and breast masses.     Physical Exam  General:     Well-developed,well-nourished,in no acute distress; alert,appropriate and cooperative throughout examination Head:     Normocephalic and atraumatic without obvious abnormalities. No apparent alopecia or balding. Eyes:     No corneal or conjunctival inflammation noted. EOMI. Perrla. Wears glasses.  Ears:     External ear exam shows no significant lesions or deformities.  Otoscopic examination reveals clear canals, tympanic membranes are intact bilaterally without bulging, retraction, inflammation or discharge. Hearing is grossly normal bilaterally. Nose:     External nasal examination shows no deformity or inflammation.  Mouth:     Oral mucosa and oropharynx without lesions or exudates.  Teeth in good repair. Neck:     No deformities, masses, or tenderness noted.  Thryoid feels borderline enlarged.  Chest Wall:  No deformities, masses, or tenderness noted. Breasts:     No mass, nodules, thickening, tenderness, bulging, retraction, inflamation, nipple discharge or skin changes noted.   Lungs:     Normal respiratory effort, chest expands symmetrically. Lungs are clear to auscultation, no crackles or wheezes. Heart:     Normal rate and regular rhythm. S1 and S2 normal without gallop, murmur, click, rub or other extra sounds. Abdomen:     Bowel sounds positive,abdomen soft and non-tender without masses, organomegaly or hernias noted. Msk:     No deformity or scoliosis noted of thoracic or lumbar spine.  No swollen red joints in hands, wirst, or elbows today.  Pulses:     R and L carotid,radial,dorsalis pedis and posterior tibial pulses are full and equal bilaterally Extremities:     No clubbing, cyanosis,  edema, or deformity noted with normal full range of motion of all joints.   Neurologic:     No cranial nerve deficits noted. Station and gait are normal. DTRs are symmetrical throughout. Sensory, motor and coordinative functions appear intact. Skin:     no rashes.   Cervical Nodes:     No lymphadenopathy noted Psych:     Cognition and judgment appear intact. Alert and cooperative with normal attention span and concentration. No apparent delusions, illusions, hallucinations    Impression & Recommendations:  Problem # 1:  EXAMINATION, ROUTINE MEDICAL (ICD-V70.0) Encourage to continue with haalthy diet and increase exercise and activity level.  Cont calcium for osteoporosis prevention.  Screening labs due, it has been a year.   Hysterectomy so no pap smear.   Will need to set her up for mammogram. 2 yrs past due.  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  No significant swellign or erythema on exam today.  Will do labs to consider RA, though it is primarly a clinical dx.  Orders: T-CBC No Diff ([id number]) T-Sed Rate (Automated) ([id number]) T-Rheumatoid Factor [id number]) T-Antinuclear Antib (ANA) ([id number])   Complete Medication List: 1)  Aspir-low 81 Mg Tbec (Aspirin) .... Take one tablet daily 2)  Calcium-vitamin D 250-125 Mg-unit Tabs (Calcium carbonate-vitamin d) .... Take one twice a day 3)  Darvocet-n 100 Tabs (Propoxyphene n-apap tabs) .... Take tablet by mouth every 4-6 hours as needed pain 4)  Zocor 20 Mg Tabs (Simvastatin) .... Take one tablet by mouth daily 5)  Xanax 0.5 Mg Tabs (Alprazolam) .... Take one tablet by mouth at bedtime as needed 6)  Prevacid 30 Mg Cpdr (Lansoprazole) .... Take one by mouth daily 7)  Paxil 20 Mg Tabs (Paroxetine hcl) .... Take one by mouth  daily 8)  Multivitamins Caps (Multiple vitamin) .... Take one by mouth daily 9)  Flunisolide 29 Mcg/act Soln (Flunisolide) .... Two sprays/nostril daily 10)  Trazodone Hcl 100 Mg Tabs (Trazodone hcl) .... Take 1/4 tablet at bedtime as needed 11)  Cinnamon 500 Mg Caps (Cinnamon) .... Two times a day 12)  Bl Flax Seed Oil 1000 Mg Caps (Flaxseed (linseed)) .... Take one tablet by mouth three times a day  Other Orders: Admin 1st Vaccine ([id number]) Flu Vaccine 1yrs + (10175)     Prescriptions: ZOCOR 20 MG TABS (SIMVASTATIN) Take one tablet by mouth daily  #90 x 3   Entered and Authorized by:   Nani Gasser MD   Signed by:   Nani Gasser MD on 11/24/2006   Method used:  Electronically sent to ...       [facility] [address] [place], [place]  [address]       Ph: [phone]       Fax: [phone]   RxID:   [id number] TRAZODONE HCL 100 MG TABS (TRAZODONE HCL) Take 1/4 tablet at bedtime as needed  #30 x 3   Entered and Authorized by:   Nani Gasser MD   Signed by:   Nani Gasser MD on 11/24/2006   Method used:   Electronically sent to ...       [facility] [address] [place], [place]  [address]       Ph: [phone]       Fax: [phone]   RxID:   [id number] PREVACID 30 MG CPDR (LANSOPRAZOLE) Take one by mouth daily  #90 x 3   Entered and Authorized by:   Nani Gasser MD   Signed by:   Nani Gasser MD on 11/24/2006   Method used:   Electronically sent to ...       [facility] [address] [place], [place]  [address]       Ph: [phone]       Fax: [phone]   RxID:   [id number]  ]   Influenza Vaccine    Vaccine Type: Fluvax 3+    Site: left deltoid    Mfr: novartis    Dose: 0.5 ml    Route: IM    Given by: Drue Stager RN    Exp. Date: 08/07/2007    Lot #: [id number]  Flu Vaccine Consent Questions    Do you have a history of severe allergic reactions to this vaccine? no     Any prior history of allergic reactions to egg and/or gelatin? no    Do you have a sensitivity to the preservative Thimersol? no    Do you have a past history of Guillan-Barre Syndrome? no    Do you currently have an acute febrile illness? no    Have you ever had a severe reaction to latex? no    Vaccine information given and explained to patient? yes    Are you currently pregnant? no

## 2010-03-09 NOTE — Assessment & Plan Note (Signed)
Summary: Left ankle pain   Vital Signs:  Patient profile:   70 year old female Height:      64.5 inches Weight:      196 pounds BMI:     33.24 O2 Sat:      97 % on Room air Temp:     98.3 degrees F oral Pulse rate:   77 / minute BP sitting:   110 / 66  (left arm) Cuff size:   large  Vitals Entered By: Payton Spark CMA (January 16, 2009 2:31 PM)  O2 Flow:  Room air CC: L foot pain x 2 days. No known injury.  Pain Assessment Patient in pain? yes     Location: L foot  Intensity: 4   Primary Care Provider:  Alden Hipp, MD  CC:  L foot pain x 2 days. No known injury. Marland Kitchen  History of Present Illness: L foot pain x 2 days. No known injury. No trauma or twisting of the ankle.  Pain on the top of her foot and noticed some swelling. Pain in constant.  Painful to point toe down and to walk on it.  Better with rest.  NO creams or tylenol.  Tender to the touch.  did sprain this ankle about 3 years ago. Pain raditeas up her anke along the tibia.   Current Medications (verified): 1)  Aspir-Low 81 Mg Tbec (Aspirin) .... Take One Tablet Daily 2)  Calcium-Vitamin D 250-125 Mg-Unit Tabs (Calcium Carbonate-Vitamin D) .... Take One Twice A Day 3)  Zocor 20 Mg Tabs (Simvastatin) .... Take One Tablet By Mouth Daily 4)  Xanax 0.5 Mg Tabs (Alprazolam) .... Take 1 Tablet By Mouth Two Times A Day As Needed 5)  Omeprazole 20 Mg Tbec (Omeprazole) .... Take 1 Tablet By Mouth Two Times A Day 6)  Multivitamins  Caps (Multiple Vitamin) .... Take One By Mouth Daily 7)  Bl Flax Seed Oil 1000 Mg  Caps (Flaxseed (Linseed)) .... Take One Tablet By Mouth Three Times A Day 8)  Miralax   Powd (Polyethylene Glycol 3350) .... One Capful Once Daily As Needed Constipation 9)  Stool Softener 100 Mg Caps (Docusate Sodium) .Marland Kitchen.. 1 By Mouth Qlunchtime and 2 By Mouth At Bedtime 10)  Vitamin D3 1000 Unit Tabs (Cholecalciferol) .Marland Kitchen.. 1 By Mouth Once Daily 11)  Zolpidem Tartrate 5 Mg Tabs (Zolpidem Tartrate) .... Take  1 Tablet By Mouth Once A Day At Bedtime 12)  Paroxetine Hcl 20 Mg Tabs (Paroxetine Hcl) .... Take 1 Tablet By Mouth Once A Day 13)  Trazodone Hcl 50 Mg Tabs (Trazodone Hcl) .... 1/2-1 Tab By Mouth Once A Day At Bedtime.  Allergies (verified): 1)  ! Codeine Sulfate (Codeine Sulfate) 2)  ! Sulfa 3)  ! Prozac  Physical Exam  General:  Well-developed,well-nourished,in no acute distress; alert,appropriate and cooperative throughout examination Msk:  Left anlke with no laxity, NROM.  Tender along the tibia and talus anterior border.  The ankle joint is not erythematous or hot to touch.  Some swelling above the medial malleolus. nOntender over the malleous. and has a spot of erythemon the the top fo the foot but she is relaly not hot or swollen there.  (may be an area of irritation from her shoe).  Pulses:  DP 2+ , post tib 2+ on the left foot.    Impression & Recommendations:  Problem # 1:  ANKLE PAIN, LEFT (ICD-719.47) Unclear etiology. She really has not trauma but does have swelling. thought the joint isn't  hot or red. Will get an xray. IN addition will check a sed rate, Uric acid nd CBC to rule ou tinfection.  For now use IBU 800mg  three times a day with food. Stop if any GI irritation. Rest, elevation, and if able to tolerate compression with an ACE wrap. Also can ice the area for comfort.  Orders: T-DG Ankle 2 Views *L* ([id number]) T-CBC w/Diff ([id number]) T-Uric Acid (Blood) [phone]) T-Sed Rate (Automated) ([id number])  Complete Medication List: 1)  Aspir-low 81 Mg Tbec (Aspirin) .... Take one tablet daily 2)  Calcium-vitamin D 250-125 Mg-unit Tabs (Calcium carbonate-vitamin d) .... Take one twice a day 3)  Zocor 20 Mg Tabs (Simvastatin) .... Take one tablet by mouth daily 4)  Xanax 0.5 Mg Tabs (Alprazolam) .... Take 1 tablet by mouth two times a day as needed 5)  Omeprazole 20 Mg Tbec (Omeprazole) .... Take 1 tablet by mouth two times a day 6)  Multivitamins Caps (Multiple  vitamin) .... Take one by mouth daily 7)  Bl Flax Seed Oil 1000 Mg Caps (Flaxseed (linseed)) .... Take one tablet by mouth three times a day 8)  Miralax Powd (Polyethylene glycol 3350) .... One capful once daily as needed constipation 9)  Stool Softener 100 Mg Caps (Docusate sodium) .Marland Kitchen.. 1 by mouth qlunchtime and 2 by mouth at bedtime 10)  Vitamin D3 1000 Unit Tabs (Cholecalciferol) .Marland Kitchen.. 1 by mouth once daily 11)  Zolpidem Tartrate 5 Mg Tabs (Zolpidem tartrate) .... Take 1 tablet by mouth once a day at bedtime 12)  Paroxetine Hcl 20 Mg Tabs (Paroxetine hcl) .... Take 1 tablet by mouth once a day 13)  Trazodone Hcl 50 Mg Tabs (Trazodone hcl) .... 1/2-1 tab by mouth once a day at bedtime. 14)  Tramadol Hcl 50 Mg Tabs (Tramadol hcl) .... Take 1 tablet by mouth three times a day as needed Prescriptions: TRAMADOL HCL 50 MG TABS (TRAMADOL HCL) Take 1 tablet by mouth three times a day as needed  #45 x 1   Entered and Authorized by:   Nani Gasser MD   Signed by:   Nani Gasser MD on 01/16/2009   Method used:   Electronically to        [facility] [phone]* (retail)       [address] [place], [place]  [address]       Ph: [phone]       Fax: [phone]   RxID:   [id number]

## 2010-03-09 NOTE — Progress Notes (Signed)
Summary: PATIENT WANTS BLOOD TEST RESULTS  Phone Note Call from Patient Call back at Home Phone [phone]   Caller: Patient Call For: Nani Gasser MD Summary of Call: PATIENT WANTS THE RESULTS FROM BLOOD TEST DONE ON FRIDAY 10/17. Initial call taken by: Lucille Passy,  November 28, 2006 11:51 AM  Follow-up for Phone Call        Let her know, no sign of inflammation or rheumatoid arthritis on her lab work.  Thryoid, liver, and kidneys are normal.  Her triglycerides are elevated so I recommend a Fish Oil supplement (1000mg  2 tabs two times a day) and regular exercise.   Follow-up by: Nani Gasser MD,  November 28, 2006 11:58 AM    INFORMED PATIENT November 28, 2006 1:13PM KASEY WHITE

## 2010-03-09 NOTE — Progress Notes (Signed)
Summary: Wants Flonase  Phone Note Call from Patient Call back at Home Phone [phone]   Caller: Patient Call For: Nani Gasser MD Summary of Call: Pt states Astelin is not working for her and has used Flonase in the past and this worked and wanted to know if you would call in the Botines to [place] in [place] Initial call taken by: Kathlene November,  January 09, 2008 10:04 AM      Prescriptions: FLUNISOLIDE 29 MCG/ACT SOLN (FLUNISOLIDE) two sprays/nostril daily  #1 x 2   Entered and Authorized by:   Nani Gasser MD   Signed by:   Nani Gasser MD on 01/09/2008   Method used:   Electronically to        [facility] [phone]* (retail)       [address] [place], [place]  [address]       Ph: [phone]       Fax: [phone]   RxID:   [id number]

## 2010-03-09 NOTE — Consult Note (Signed)
Summary: [facility]  [facility]   Imported By: Lanelle Bal 12/22/2008 08:27:43  _____________________________________________________________________  External Attachment:    Type:   Image     Comment:   External Document

## 2010-03-09 NOTE — Letter (Signed)
Summary: Work Excuse  [facility]  [address] [place], [address]   [place], [place] [address]   Phone: [phone]  Fax: [phone]    Today's Date: November 08, 2006  Name of Patient: [facility]  The above named patient had a medical visit today at:  am / pm.  Please take this into consideration when reviewing the time away from work/school.    Special Instructions: x [  ] None  [  ] To be off the remainder of today, returning to the normal work / school schedule tomorrow.  [  ] To be off until the next scheduled appointment on ______________________.  [  ] Other ________________________________________________________________ ________________________________________________________________________   The patient was seen by:    [  ] Dr. Nani Gasser  Office Representative Signature _____________________________________________   If there are any questions, please call the office at [phone].

## 2010-03-09 NOTE — Progress Notes (Signed)
Summary: Sinus Infection?  Phone Note Call from Patient Call back at Home Phone [phone]   Caller: Patient Call For: Nani Gasser MD Summary of Call: Pt calls and c/o sinus pressure, pressure behind eyes and ears, H/A, blowing green and wants to know if you will call her in antibiotic or needs to be seen. We have no appts. left today Initial call taken by: Kathlene November,  August 15, 2008 1:00 PM  Follow-up for Phone Call        Can be seen. Tell her to come at [facility].   Follow-up by: Nani Gasser MD,  August 15, 2008 1:30 PM  Additional Follow-up for Phone Call Additional follow up Details #1::        Pt notified and sent to scheduling. kJ LPN Additional Follow-up by: Kathlene November,  August 15, 2008 1:38 PM

## 2010-03-09 NOTE — Assessment & Plan Note (Signed)
Summary: f/u thyroid, flushng, ithcing, sleep, mood   Vital Signs:  Patient profile:   70 year old female Height:      64.5 inches Weight:      195 pounds Pulse rate:   73 / minute BP sitting:   93 / 57  (left arm) Cuff size:   regular  Vitals Entered By: Kathlene November (July 08, 2008 9:34 AM) CC: discuss thyroid medication Is Patient Diabetic? No Pain Assessment Patient in pain? no        Primary Care Provider:  Alden Hipp, MD  CC:  discuss thyroid medication.  History of Present Illness: Felt like was being "shot up on adrenaline" last wednesday,  but each day it got better.  Then thought it might be the trazodone since it was upped to 100mg  so stopped the medication "cold [place]". Then took 50mg  and seemed more calm.  Does feel she is ay be having mpanic attacks.  Though, she denies feeling stressed or particularly anxious.  Started having itching and irritability and adrenaline rush off so stopped the thyroid supplement but then sxs started back again about 3-4 days off the thyroid med. Most of her itching is from her head up. Having a  hard time staying asleep now that she is off the trazodone. She would like to try something else.  She is also having "hot flashes" where she suddenlty feels very flushed and a pressure sensation in her head. Says it is a really strange feeling.  Not always associated wtih the "adrenaline" feeling that she gets. No CP or SOB.    Started feeling glushed in the room and we recheck her BP and it was 121/75 and pulse was 75.    Brought in her toenail for culture today.    Current Medications (verified): 1)  Aspir-Low 81 Mg Tbec (Aspirin) .... Take One Tablet Daily 2)  Calcium-Vitamin D 250-125 Mg-Unit Tabs (Calcium Carbonate-Vitamin D) .... Take One Twice A Day 3)  Zocor 20 Mg Tabs (Simvastatin) .... Take One Tablet By Mouth Daily 4)  Xanax 0.5 Mg Tabs (Alprazolam) .... Take 1 Tablet By Mouth Two Times A Day As Needed 5)  Omeprazole 20 Mg  Tbec (Omeprazole) .... Take 1 Tablet By Mouth Two Times A Day 6)  Multivitamins  Caps (Multiple Vitamin) .... Take One By Mouth Daily 7)  Flunisolide 29 Mcg/act Soln (Flunisolide) .... Two Sprays/nostril Daily 8)  Trazodone Hcl 50 Mg Tabs (Trazodone Hcl) .Marland Kitchen.. 1-2 By Mouth At Bedtime As Needed Sleep 9)  Bl Flax Seed Oil 1000 Mg  Caps (Flaxseed (Linseed)) .... Take One Tablet By Mouth Three Times A Day 10)  Miralax   Powd (Polyethylene Glycol 3350) .... One Capful Once Daily As Needed Constipation 11)  Stool Softener 100 Mg Caps (Docusate Sodium) .Marland Kitchen.. 1 By Mouth Qlunchtime and 2 By Mouth At Bedtime 12)  Vitamin D3 1000 Unit Tabs (Cholecalciferol) .Marland Kitchen.. 1 By Mouth Once Daily 13)  Claritin 10 Mg Tabs (Loratadine) .Marland Kitchen.. 1 By Mouth Once Daily 14)  Tens Unit .... As Directed 15)  Darvocet-N 100 100-650 Mg Tabs (Propoxyphene N-Apap) .Marland Kitchen.. 1 Tab By Mouth Three Times A Day As Needed Severe Pain  Allergies (verified): 1)  ! Codeine Sulfate (Codeine Sulfate) 2)  ! Sulfa 3)  ! Prozac  Comments:  Nurse/Medical Assistant: The patient's medications and allergies were reviewed with the patient and were updated in the Medication and Allergy Lists. Kathlene November (July 08, 2008 9:35 AM)  Physical Exam  General:  Well-developed,well-nourished,in no acute distress; alert,appropriate and cooperative throughout examination Neck:  No deformities, masses, or tenderness noted.  No TM.  Lungs:  Normal respiratory effort, chest expands symmetrically. Lungs are clear to auscultation, no crackles or wheezes. Heart:  Normal rate and regular rhythm. S1 and S2 normal without gallop, murmur, click, rub or other extra sounds. Skin:  no rashes.  No facial flushing.  Cervical Nodes:  No lymphadenopathy noted Psych:  Cognition and judgment appear intact. Alert and cooperative with normal attention span and concentration. No apparent delusions, illusions, hallucinations   Impression & Recommendations:  Problem # 1:  FLUSHING  (ICD-782.62) Will check urine for possiblity of pheochromocytoma thought this would be very rare.   Orders: T-Urine 24 Hr. Creatinine Clearance [phone]) T-Urine 24 Hr. Metanephrines [phone]) T-Urine 24hr. VMA/HVA ([id number]) T-Comprehensive Metabolic Panel ([id number])  Problem # 2:  PRURITUS (ICD-698.9) Will check liver enzymes. Had other labs in April that were normal.  Orders: T-Urine 24 Hr. Creatinine Clearance [phone]) T-Urine 24 Hr. Metanephrines ([phone]) T-Urine 24hr. VMA/HVA ([id number]) T-Comprehensive Metabolic Panel ([id number]) T-CBC No Diff ([id number])  Problem # 3:  ONYCHOMYCOSIS (ICD-110.1) Cx sent.  Orders: T-Culture, Fungus w/o Smear ([id number])  Problem # 4:  ANXIETY (ICD-300.00) Discussed options. Consider starting an SSRI, though some of her sxs may be related to something metabolic.  Will stop trazodone and start Ambien 1/2 tab for sleep.  The following medications were removed from the medication list:    Trazodone Hcl 50 Mg Tabs (Trazodone hcl) .Marland Kitchen... 1-2 by mouth at bedtime as needed sleep Her updated medication list for this problem includes:    Xanax 0.5 Mg Tabs (Alprazolam) .Marland Kitchen... Take 1 tablet by mouth two times a day as needed  Problem # 5:  INSOMNIA (ICD-780.52)  Will stop trazodone and start Ambien 1/2 tab for sleep.  Her updated medication list for this problem includes:    Zolpidem Tartrate 5 Mg Tabs (Zolpidem tartrate) .Marland Kitchen... Take 1 tablet by mouth once a day at bedtime  Problem # 6:  FATIGUE (ICD-780.79) PT says the thyroid supplement really helped her energy level and really made her aches nad pains go away except for some minimal back pain.  She was very happy about this.  Aches and pain stated to return after 3 days off the supplement.  SHe is to hold this medication until I get her labs back.    Complete Medication List: 1)  Aspir-low 81 Mg Tbec (Aspirin) .... Take one tablet daily 2)  Calcium-vitamin  D 250-125 Mg-unit Tabs (Calcium carbonate-vitamin d) .... Take one twice a day 3)  Zocor 20 Mg Tabs (Simvastatin) .... Take one tablet by mouth daily 4)  Xanax 0.5 Mg Tabs (Alprazolam) .... Take 1 tablet by mouth two times a day as needed 5)  Omeprazole 20 Mg Tbec (Omeprazole) .... Take 1 tablet by mouth two times a day 6)  Multivitamins Caps (Multiple vitamin) .... Take one by mouth daily 7)  Flunisolide 29 Mcg/act Soln (Flunisolide) .... Two sprays/nostril daily 8)  Bl Flax Seed Oil 1000 Mg Caps (Flaxseed (linseed)) .... Take one tablet by mouth three times a day 9)  Miralax Powd (Polyethylene glycol 3350) .... One capful once daily as needed constipation 10)  Stool Softener 100 Mg Caps (Docusate sodium) .Marland Kitchen.. 1 by mouth qlunchtime and 2 by mouth at bedtime 11)  Vitamin D3 1000 Unit Tabs (Cholecalciferol) .Marland Kitchen.. 1 by mouth once daily 12)  Claritin 10 Mg Tabs (Loratadine) .Marland Kitchen.. 1 by mouth once daily  13)  Tens Unit  .... As directed 14)  Darvocet-n 100 100-650 Mg Tabs (Propoxyphene n-apap) .Marland Kitchen.. 1 tab by mouth three times a day as needed severe pain 15)  Zolpidem Tartrate 5 Mg Tabs (Zolpidem tartrate) .... Take 1 tablet by mouth once a day at bedtime Prescriptions: ZOLPIDEM TARTRATE 5 MG TABS (ZOLPIDEM TARTRATE) Take 1 tablet by mouth once a day at bedtime  #30 x 0   Entered and Authorized by:   Nani Gasser MD   Signed by:   Nani Gasser MD on 07/08/2008   Method used:   Print then Give to Patient   RxID:   [id number] Prudy Feeler 0.5 MG TABS (ALPRAZOLAM) Take 1 tablet by mouth two times a day as needed  #45 x 0   Entered and Authorized by:   Nani Gasser MD   Signed by:   Nani Gasser MD on 07/08/2008   Method used:   Print then Give to Patient   RxID:   [id number]

## 2010-03-09 NOTE — Assessment & Plan Note (Signed)
Summary: Remove mole   Vital Signs:  Patient Profile:   70 Years Old Female Height:     64.5 inches Weight:      186 pounds BMI:     31.55 Pulse rate:   72 / minute BP sitting:   102 / 67  (left arm)  Vitals Entered By: Edmonia Lynch (December 30, 2005 9:35 AM)            Procedure Note Last Tetanus: Historical (02/07/2002)  Cyst Removal: The patient complains of changing mole and suspicious lesion. Date of onset: 12/02/2005 Indication: suspicious lesion Consent signed: no    Size (in cm): 0.7 x 0.6    Region: anterior    Location: left lower leg    Comment: Verbal consent obatined. Of note pt hit lesion with razor this AM, some Bleeding.  On anterior shin. Shave bx perfomed after skin cleaned.     Instrument used: #15 blade    Anesthesia: 1% lidocaine w/epinephrine  Cleaned and prepped with: betadine Wound dressing: bacitracin Additional Instructions: BAnd-aid placed.  Keep clean with soap and water.     Chief Complaint:  Remove skin tag on lower left leg..   Impression & Recommendations:  Problem # 1:  NEOPLASM, SKIN, UNCERTAIN BEHAVIOR (ICD-238.2) Shave bx performed.  Will f/u result from pathology at [facility]  Routine wound care.  Call if signs of infection.  Orders: Shave Skin Lesion 0.6-1.0 cm epidermal/dermal (16109)

## 2010-03-09 NOTE — Assessment & Plan Note (Signed)
Summary: Mood, nausea, sweats, SON IS DYING OF LUNG CA   Vital Signs:  Patient Profile:   70 Years Old Female Height:     64.5 inches Weight:      194 pounds Pulse rate:   76 / minute BP sitting:   112 / 60  (left arm) Cuff size:   regular  Vitals Entered By: Kathlene November (September 26, 2007 10:18 AM)             Comments Recheck BP @ 10:54am- BP=124/79 P=76  Temp=97.6     PCP:  Cipriano Bunker  Chief Complaint:  son dying of lung CA and feeling very emotional, nauseated, dizziness, and overwhelmed.  History of Present Illness: Her son was diagnosed with lung cancer in March.  It is now metastatic and he is living with her. SHe is taking care of him around the clock. Home hospice is visiting as well.  Has been have occassiaonal dizziness episodes. Had one episode where felt like she almost passed out. At other times feels like things are moing.  Also noticed she is very senstive to heat.  As soon as walks outside or into her sons room will breaks out in a sweat immediately and feel nauseatedd. No vomiting.  Sometimes nauseated when not sweating. No relationship to food.  Says never really gets nauseated, as this is "unlike her".  .  No CP or SOB. Off HRT for about 4 years, but she wonder if it is hormonal.  No abdominal pain per se but has noticed stools more firm but no constipation.  She does have hx of IBS but says it really hasn't bothered her in years. Also has been very tearful, feels like she can't control it.  Admist she is not eating well. Will eat a salad for lunch and then ice cream for dinner because feels she doesn't really have time to eat. Not sleeping well. Trazodone not helping. Says part of her difficulty falling asleep is pain in her left shoulder that she had surgery on about 8 wks agao.     Current Allergies: ! CODEINE SULFATE (CODEINE SULFATE) ! SULFA ! PROZAC   Family History:    Alcoholism-Father, Brother    Mother had RA, kidney failure    Father had psoriasis    Son with Lung Ca, HIV  Matt  Social History:    Reviewed history from 01/15/2007 and no changes required:       12 yrs education. Married to [place] who is incarcerated.  4 adult chldren.  Quit smoking 1995, no drugs, no EtOH, 3 caffeinated drinks per day, Stretches daily and goes for walks     Physical Exam  General:     Whiel in the office pt felt hot all over suddenly and then started crying. Says this is sometimes what happens to her.  Head:     Normocephalic and atraumatic without obvious abnormalities. No apparent alopecia or balding. Eyes:     No corneal or conjunctival inflammation noted. EOMI. Perrla. Wears glasses.  Ears:     External ear exam shows no significant lesions or deformities.  Otoscopic examination reveals clear canals, tympanic membranes are intact bilaterally without bulging, retraction, inflammation or discharge. Hearing is grossly normal bilaterally. Nose:     External nasal examination shows no deformity or inflammation. Nasal mucosa are pink and moist without lesions or exudates. Mouth:     Oral mucosa and oropharynx without lesions or exudates.  Teeth in good repair. Neck:  No deformities, masses, or tenderness noted.  No TM.  Lungs:     Normal respiratory effort, chest expands symmetrically. Lungs are clear to auscultation, no crackles or wheezes. Heart:     Normal rate and regular rhythm. S1 and S2 normal without gallop, murmur, click, rub or other extra sounds. Neurologic:     alert & oriented X3, cranial nerves II-XII intact, and gait normal.   Skin:     no rashes.   Cervical Nodes:     No lymphadenopathy noted Psych:     Cognition and judgment appear intact. Alert and cooperative with normal attention span and concentration. No apparent delusions, illusions, hallucinations    Impression & Recommendations:  Problem # 1:  NAUSEA (ICD-787.02) Most likely related to her stress. REcommend trial of Kapidex in place of her Prevacid for 10 days to  see if helps.  Encouraged her to eat more regularly adn more healthy foods. Need to work on improving her sleep quality.  She is clearly mentally and physically exhausted.  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  Pt wants to think about it.   Problem # 3:  SWEATING (ICD-780.8) Unsure if from lack of sleep and fatigue but will check TSH, It was normal 6 months ago.  She is post menopuasal so expect her FSH and LH to be high and progesterone to be low.   Orders: T-TSH [phone]) T-Comprehensive Metabolic Panel [phone]) T-CBC No Diff ([id number]) T-FSH ([id number]) T-LH ([id number]) T-Progesterone (23762)   Problem # 4:  DIZZINESS (ICD-780.4) Most likely reated to her stress.  I feel she may actually be having mini-panic attacks like she had in the office here today.  EKG was normal with 75bpm, no actue changes.  No ST T wave changes.  Orders: EKG w/ Interpretation (93000)   Complete Medication List: 1)  Aspir-low 81 Mg Tbec (Aspirin) .... Take one tablet daily 2)  Calcium-vitamin D 250-125 Mg-unit Tabs (Calcium carbonate-vitamin d) .... Take one twice a day 3)  Tramadol Hcl 50 Mg Tabs (Tramadol hcl) .... One by mouth every 6 hours as needed pain 4)  Zocor 20 Mg Tabs (Simvastatin) .... Take one tablet by mouth daily 5)  Xanax 0.5 Mg Tabs (Alprazolam) .... Take 1 tablet by mouth two times a day as needed 6)  Prevacid 30 Mg Cpdr (Lansoprazole) .... Take one by mouth daily 7)  Paxil 20 Mg Tabs (Paroxetine hcl) .... Take one by mouth daily 8)  Multivitamins Caps (Multiple vitamin) .... Take one by mouth daily 9)  Flunisolide 29 Mcg/act Soln (Flunisolide) .... Two sprays/nostril daily 10)  Trazodone Hcl 100 Mg Tabs  (Trazodone hcl) .... Take 1 tablet by mouth once a day 11)  Cinnamon 500 Mg Caps (Cinnamon) .... Two times a day 12)  Bl Flax Seed Oil 1000 Mg Caps (Flaxseed (linseed)) .... Take one tablet by mouth three times a day 13)  Piroxicam 20 Mg Caps (Piroxicam) .... Take 1 tablet by mouth once a day as needed joint pain 14)  Proair Hfa 108 (90 Base) Mcg/act Aers (Albuterol sulfate) .... 2-4 puffs inhaled every 4-6 hours as needed cough or shortness of breath  Other Orders: Fingerstick (83151) Capillary Blood Glucose (76160)    Prescriptions: XANAX 0.5 MG TABS (ALPRAZOLAM) Take 1 tablet by mouth two times a  day as needed  #45 x 0   Entered and Authorized by:   Nani Gasser MD   Signed by:   Nani Gasser MD on 09/26/2007   Method used:   Print then Give to Patient   RxID:   [id number]  ] Laboratory Results   Blood Tests   Date/Time Received: 09/26/07 @ 10:55am Date/Time Reported: 09/26/07 @ 10:55am  Glucose (random): 95 mg/dL   (Normal Range: 88-416)

## 2010-03-09 NOTE — Procedures (Signed)
Summary: Spirometry  Spirometry   Imported By: Kathlene November 06/21/2007 07:52:59  _____________________________________________________________________  External Attachment:    Type:   Image     Comment:   External Document

## 2010-03-09 NOTE — Progress Notes (Signed)
Summary: recall   Phone Note Call from Patient Call back at Home Phone [phone] Call back at c# [phone]   Caller: Patient Call For: PERRY  Reason for Call: Talk to Nurse Details for Reason: recall  Summary of Call: pt had last Colon in 09/2000..would like to know when is next Colon due? ( no reminder in IDX) Initial call taken by: Guadlupe Spanish [facility],  December 17, 2007 8:52 AM  Follow-up for Phone Call        Called patient and informed her that it would be due 09/2010 unless she is symptomatic.  She states her son just died 2 months ago and she has been having increased symptoms of her IBS.  I advised her to call the office when she is ready to see Dr. Marina Goodell if her symptoms do not improve. She agreed.  Milford Cage CMA  December 17, 2007 1:41 PM  Follow-up by: Milford Cage CMA,  December 17, 2007 1:41 PM

## 2010-03-09 NOTE — Miscellaneous (Signed)
Summary: Discharge/[facility]  Discharge/[facility]   Imported By: Lanelle Bal 05/21/2008 08:29:04  _____________________________________________________________________  External Attachment:    Type:   Image     Comment:   External Document

## 2010-03-09 NOTE — Assessment & Plan Note (Signed)
Summary: LE swelling   Vital Signs:  Patient Profile:   70 Years Old Female Height:     64.5 inches Weight:      197 pounds Pulse rate:   78 / minute BP sitting:   110 / 61  (left arm) Cuff size:   large  Pt. in pain?   yes    Intensity:   5  Vitals Entered By: Kathlene November (January 15, 2007 9:33 AM)                  PCP:  Cipriano Bunker  Chief Complaint:  ? mole on back wants looked at. Bilateral leg pain radiates from hips down the leg has bilateral edema in lower legs.  History of Present Illness: 1- wants spot on back examined. present for 6months or so, has others that the dermatologist has said are benign, but this one she is unable to see.  does not seem to be changing size. no bleeding.  2- Leg swelling/pain- has had increasing LE swelling for last few weeks.  b/l leg pain seems mostly stable but right leg may be getting a little worse. Piroxicam helps significantly.  Darvocet only helps a little especially with night pain.  biggest concern today is the swelling.  She c/o of some exertional SOB which seems new, but not consistent.  denies any orthopnea.   Current Allergies: ! CODEINE SULFATE (CODEINE SULFATE) ! SULFA ! PROZAC  Past Medical History:    Reviewed history from 08/25/2006 and no changes required:       3 bulging disks, mild spinal stenosis,  L4 affected       Hx of Diverticuli, colon,        Lung nodules, calcified granulomas,        ABIs were normal at screen done on 03/31/06   Social History:    12 yrs education. Married to [place] who is incarcerated.  4 adult chldren.  Quit smoking 1995, no drugs, no EtOH, 3 caffeinated drinks per day, Stretches daily and goes for walks     Physical Exam  General:     Well-developed,well-nourished,in no acute distress; alert,appropriate and cooperative throughout examination Lungs:     Normal respiratory effort, chest expands symmetrically. Lungs are clear to auscultation, no crackles or wheezes. Heart:  Normal rate and regular rhythm. S1 and S2 normal without gallop, murmur, click, rub or other extra sounds. Extremities:     left pretibial edema and right pretibial edema.   Neurologic:     sensation intact to light touch, gait normal, and DTRs symmetrical and normal.      Impression & Recommendations:  Problem # 1:  EDEMA (ICD-782.3) Assessment: New likely due to effects of piroxicam.  Do not want to d/c since working well.  Will do Lasix once dialy in the morning the next few days then as needed once daily.  If worsening, no improvement or increasing SOB, to RTC for f/u.  Her updated medication list for this problem includes:    Furosemide 20 Mg Tab (Furosemide) .Marland Kitchen... 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 if becomes bothersome, may have it frozen in office or let derm know at next visit.  Orders: [facility]- Est  Level 4 (99214)   Problem # 3:  SCIATICA (ICD-724.3) Assessment: Unchanged Seems reatively stable except continued difficulty with sleeping at night due  to pain.  recommend taking Darvocet prior to dinner and again at bedtime rather than solely at bedtime to see if pain control is improved at night.  Her updated medication list for this problem includes:    Aspir-low 81 Mg Tbec (Aspirin) .Marland Kitchen... Take one tablet daily    Darvocet-n 100 Tabs (Propoxyphene n-apap tabs) .Marland Kitchen... Take tablet by mouth every 4-6 hours as needed pain    Piroxicam 20 Mg Caps (Piroxicam) .Marland Kitchen... Take 1 tablet by mouth once a day as needed joint pain  Orders: [facility]- Est  Level 4 (99214)   Complete Medication List: 1)  Aspir-low 81 Mg Tbec (Aspirin) .... Take one tablet daily 2)  Calcium-vitamin D 250-125 Mg-unit Tabs (Calcium carbonate-vitamin d) .... Take one twice a day 3)  Darvocet-n 100 Tabs (Propoxyphene n-apap tabs) .... Take tablet by mouth every 4-6 hours as needed pain 4)   Zocor 20 Mg Tabs (Simvastatin) .... Take one tablet by mouth daily 5)  Xanax 0.5 Mg Tabs (Alprazolam) .... Take one tablet by mouth at bedtime as needed 6)  Prevacid 30 Mg Cpdr (Lansoprazole) .... Take one by mouth daily 7)  Paxil 20 Mg Tabs (Paroxetine hcl) .... Take one by mouth daily 8)  Multivitamins Caps (Multiple vitamin) .... Take one by mouth daily 9)  Flunisolide 29 Mcg/act Soln (Flunisolide) .... Two sprays/nostril daily 10)  Trazodone Hcl 100 Mg Tabs (Trazodone hcl) .... Take 1/4 tablet at bedtime as needed 11)  Cinnamon 500 Mg Caps (Cinnamon) .... Two times a day 12)  Bl Flax Seed Oil 1000 Mg Caps (Flaxseed (linseed)) .... Take one tablet by mouth three times a day 13)  Piroxicam 20 Mg Caps (Piroxicam) .... Take 1 tablet by mouth once a day as needed joint pain 14)  Furosemide 20 Mg Tab (Furosemide) .... Take 1 tablet by mouth once a day     Prescriptions: FUROSEMIDE 20 MG TAB (FUROSEMIDE) Take 1 tablet by mouth once a day  #30 x 0   Entered and Authorized by:   Devra Dopp MD   Signed by:   Devra Dopp MD on 01/15/2007   Method used:   Print then Give to Patient   RxID:   [id number]  ]

## 2010-03-09 NOTE — Progress Notes (Signed)
Summary: patient needs to speak w/dr.  Phone Note Call from Patient Reason for Call: Talk to Doctor Summary of Call: Patient called and stated that area of mole removal was pink, swollen, and becoming painful.  Patient started using antibiotic cream this morning but believes that the area is infected.  Patient denied appt for tomorrow until she can speak with DR. Please call @ [phone] Initial call taken by: Arlan Organ,  January 02, 2006 2:47 PM    Follow-up for Phone Call Details for Follow-up Action Taken: Called pt-area is swollen and red with some drainage.  Put bandaid and antibiotic ointment on it.  Told pt to come in now to check area.   Follow-up by: Nani Gasser MD,  January 02, 2006 2:51 PM

## 2010-03-09 NOTE — Assessment & Plan Note (Signed)
Summary: CBG   Vital Signs:  Patient Profile:   70 Years Old Female Weight:      184 pounds Pulse rate:   73 / minute BP sitting:   111 / 67  (left arm)  Vitals Entered By: Harlene Salts (December 22, 2005 8:32 AM)            PCP:  Cipriano Bunker  Chief Complaint:  f/u for elevated Glucose.  History of Present Illness: Elevated CBG- Doing [facility] Diet and is exercising/walking regulary.  Was hoping this would make a big impact on FCBG.    Moles on Left leg noticed 2 weeks.  No itching.  Some peeling/dryness.  Hx of uterine Ca.  Also notice mole on left back below bra strap.  No irritating.    Hx of Calcified nodules on CXR.  Usually has repeat CXR Q 35yrs.  Says is due in January.    Dyspepsia History:      There is a prior history of GERD.     Past Medical History:    3 bulging disks, mild spinal stenosis,  L4 affected    Hx of Diverticuli, colon,     Lung nodules, calcified granulomas,   Risk Factors:  Tobacco use:  quit    Year quit:  1995 Drug use:  no Caffeine use:  4 drinks per day Alcohol use:  no Exercise:  yes  Mammogram History:     Date of Last Mammogram:  12/08/2004    Results:  Normal Bilateral   Colonoscopy History:     Date of Last Colonoscopy:  09/07/2000    Results:  Unknown   Review of Systems      See HPI  Physical Exam  General:     Well-developed,well-nourished,in no acute distress; alert,appropriate and cooperative throughout examination Head:     Normocephalic and atraumatic without obvious abnormalities. No apparent alopecia or balding. Neck:     No deformities, masses, or tenderness noted. Lungs:     Normal respiratory effort, chest expands symmetrically. Lungs are clear to auscultation, no crackles or wheezes. Heart:     Normal rate and regular rhythm. S1 and S2 normal without gallop, murmur, click, rub or other extra sounds.  No carotid bruits.  Extremities:     No clubbing, cyanosis, edema, or deformity noted with  normal full range of motion of all joints.   Skin:     Seborrheic keratosis on left back just below bra strap.  On left leg approx 3/4 cm gray papule. Round and smooth with some dry flaking.    Impression & Recommendations:  Problem # 1:  ABNORMAL GLUCOSE NEC (ICD-790.29) Repeat CBG was elevated today.  Gave H.O. on diabetic diet.  Rec start stage II of her Atkins diet.  Continue with regular exercise.  Hopefully we can keep this well controlled over time. F/U 3 months.  Orders: Est. Patient Level IV (47829)   Problem # 2:  PULMONARY NODULE (ICD-518.89) Repeat CXR in January.   Problem # 3:  NEOPLASM, SKIN, UNCERTAIN BEHAVIOR (ICD-238.2) Rec bx of lesion on left leg.  Will set up for next week.  One on back is seborrheic keratosis.  Gave reassurance.   Orders: Est. Patient Level IV (56213)   Medications Added to Medication List This Visit: 1)  Claritin 10 Mg Tabs (Loratadine) .... Take 1 tablet by mouth once a day 2)  Magnesium Sulfate Caps (Magnesium caps) .... Two times a day 3)  Chromium Picolinate 200 Mcg  Tabs (Chromium picolinate) .... Take 1 tablet by mouth once a day 4)  Cinnamon 500 Mg Caps (Cinnamon) .... Two times a day  Other Orders: Capillary Blood Glucose ([address]) Fingerstick (60454)  Patient Instructions: 1)  F/u next Friday in early AM for skin Bx. .  Laboratory Results   Blood Tests Date/Time Recieved: December 22, 2005 8:43 AM  Date/Time Reported: December 22, 2005 8:43 AM  Glucose (fasting): 121 mg/dL   (Normal Range: 09-811)    Tetanus/Td Immunization History:    Tetanus/Td # 1:  Historical (02/07/2002)  Influenza Immunization History:    Influenza # 1:  Fluvax Non-MCR (11/08/2005)

## 2010-03-09 NOTE — Procedures (Signed)
Summary: colon : diverticulosis,int hemorrhoids/[facility]  colon : diverticulosis,int hemorrhoids/[facility]   Imported By: Lester [place] 12/19/2007 11:23:25  _____________________________________________________________________  External Attachment:    Type:   Image     Comment:   External Document

## 2010-03-09 NOTE — Assessment & Plan Note (Signed)
Summary: 4 MONTH FU insuling resistance, neck pain   Vital Signs:  Patient profile:   70 year old female Height:      64.5 inches Weight:      188 pounds Pulse rate:   75 / minute BP sitting:   102 / 53  (left arm) Cuff size:   regular  Vitals Entered By: Kathlene November (April 14, 2009 9:12 AM) CC: checkup HgA1C Is Patient Diabetic? No   Primary Care Provider:  Alden Hipp, MD  CC:  checkup HgA1C.  History of Present Illness: checkup HgA1C. Has changed her eating habit and has  lost about 4.5 pounds int eh last 4 weeks. Has been walking for exericse.  Has also changed the size plate she is using and feels this has really improve dher portion control.   Still getting occ severe HA in teh back of her head and radiating down into her neck.  Wonders if she has a problem in her neck.  Will hurt on both side.  No exacerbating or alleiviating symptoms.  Feels tight sometimes.  Not sure if related to hr sinuses or not. Has been using the tramadol but feels it is not really helping much so would like something stronger.   Has her allergy appt later this week. So had to stop all of her antihistamines yesterday; stil having the HA and post nasal drip. Worse when goes outside, esp when windy.   Current Medications (verified): 1)  Aspir-Low 81 Mg Tbec (Aspirin) .... Take One Tablet Daily 2)  Calcium-Vitamin D 250-125 Mg-Unit Tabs (Calcium Carbonate-Vitamin D) .... Take One Twice A Day 3)  Zocor 20 Mg Tabs (Simvastatin) .... Take One Tablet By Mouth Daily 4)  Xanax 0.5 Mg Tabs (Alprazolam) .... Take 1 Tablet By Mouth Two Times A Day As Needed 5)  Multivitamins  Caps (Multiple Vitamin) .... Take One By Mouth Daily 6)  Bl Flax Seed Oil 1000 Mg  Caps (Flaxseed (Linseed)) .... Take One Tablet By Mouth Three Times A Day 7)  Miralax   Powd (Polyethylene Glycol 3350) .... One Capful Once Daily As Needed Constipation 8)  Stool Softener 100 Mg Caps (Docusate Sodium) .Marland Kitchen.. 1 By Mouth Qlunchtime and 2  By Mouth At Bedtime 9)  Vitamin D3 1000 Unit Tabs (Cholecalciferol) .Marland Kitchen.. 1 By Mouth Once Daily 10)  Zolpidem Tartrate 5 Mg Tabs (Zolpidem Tartrate) .... Take 1 Tablet By Mouth Once A Day At Bedtime 11)  Paroxetine Hcl 20 Mg Tabs (Paroxetine Hcl) .... Take 1 Tablet By Mouth Once A Day 12)  Trazodone Hcl 50 Mg Tabs (Trazodone Hcl) .... 1/2-1 Tab By Mouth Once A Day At Bedtime. 13)  Tramadol Hcl 50 Mg Tabs (Tramadol Hcl) .... Take 1 Tablet By Mouth Three Times A Day As Needed 14)  Omeprazole 20 Mg Cpdr (Omeprazole) .... Take One Capsule By Mouth Twice A Day  Allergies (verified): 1)  ! Codeine Sulfate (Codeine Sulfate) 2)  ! Sulfa 3)  ! Prozac  Comments:  Nurse/Medical Assistant: The patient's medications and allergies were reviewed with the patient and were updated in the Medication and Allergy Lists. Kathlene November (April 14, 2009 9:13 AM)  Physical Exam  General:  Well-developed,well-nourished,in no acute distress; alert,appropriate and cooperative throughout examination Lungs:  Normal respiratory effort, chest expands symmetrically. Lungs are clear to auscultation, no crackles or wheezes. Heart:  Normal rate and regular rhythm. S1 and S2 normal without gallop, murmur, click, rub or other extra sounds. Pulses:  Radial 2+_  Extremities:  No LE edema.  Skin:  no rashes.   Psych:  Cognition and judgment appear intact. Alert and cooperative with normal attention span and concentration. 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 Has not progressed to DM. FU in 6 mo.    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  She might benefit more from a muscle relaxer. Pt wants to think about the xray. Hasn't done PT.  If Bupap and tramadol not working then consider further w/u  with xray. Her updated medication list for this problem includes:    Aspir-low 81 Mg Tbec (Aspirin) .Marland Kitchen... Take one tablet daily    Tramadol Hcl 50 Mg Tabs (Tramadol hcl) .Marland Kitchen... Take 1 tablet by mouth three times a day as needed  Complete Medication List: 1)  Aspir-low 81 Mg Tbec (Aspirin) .... Take one tablet daily 2)  Calcium-vitamin D 250-125 Mg-unit Tabs (Calcium carbonate-vitamin d) .... Take one twice a day 3)  Zocor 20 Mg Tabs (Simvastatin) .... Take one tablet by mouth daily 4)  Xanax 0.5 Mg Tabs (Alprazolam) .... Take 1 tablet by mouth two times a day as needed 5)  Multivitamins Caps (Multiple vitamin) .... Take one by mouth daily 6)  Bl Flax Seed Oil 1000 Mg Caps (Flaxseed (linseed)) .... Take one tablet by mouth three times a day 7)  Miralax Powd (Polyethylene glycol 3350) .... One capful once daily as needed constipation 8)  Stool Softener 100 Mg Caps (Docusate sodium) .Marland Kitchen.. 1 by mouth qlunchtime and 2 by mouth at bedtime 9)  Vitamin D3 1000 Unit Tabs (Cholecalciferol) .Marland Kitchen.. 1 by mouth once daily 10)  Zolpidem Tartrate 5 Mg Tabs (Zolpidem tartrate) .... Take 1 tablet by mouth once a day at bedtime 11)  Paroxetine Hcl 20 Mg Tabs (Paroxetine hcl) .... Take 1 tablet by mouth once a day 12)  Trazodone Hcl 50 Mg Tabs (Trazodone hcl) .... 1/2-1 tab by mouth once a day at bedtime. 13)  Tramadol Hcl 50 Mg Tabs (Tramadol hcl) .... Take 1 tablet by mouth three times a day as needed 14)  Omeprazole 20 Mg Cpdr (Omeprazole) .... Take one capsule by mouth twice a day  Other Orders: Fingerstick (36416) Hgb A1C (91478GN)  Laboratory Results   Blood Tests   Date/Time Received: 04/14/2009 Date/Time Reported: 04/14/2009  HGBA1C: 6.0%   (Normal Range: Non-Diabetic - 3-6%   Control Diabetic - 6-8%)

## 2010-03-09 NOTE — Progress Notes (Signed)
Summary: Puncture Wound  Phone Note Call from Patient Call back at Home Phone [phone]   Caller: Patient Call For: Nani Gasser MD Summary of Call: Pt calls and states was caring for a stray dog and took to vet to have checked and they had to put the dog down this morning due to seizures. Has a puncture wound from dog tooth and vet wasn't sure if it had rabies or not. Please advise Initial call taken by: Kathlene November,  November 20, 2007 1:15 PM  Follow-up for Phone Call        REcommend monitor for sxs of infection at the wound sit, increased thrist, fever or change in MS.  Follow-up by: Nani Gasser MD,  November 20, 2007 1:23 PM  Additional Follow-up for Phone Call Additional follow up Details #1::        Pt notified of MD instructions via VM. KJ LPN Additional Follow-up by: Kathlene November,  November 20, 2007 1:58 PM

## 2010-03-09 NOTE — Assessment & Plan Note (Signed)
Summary: F/U WITH LEG   Vital Signs:  Patient Profile:   70 Years Old Female Height:     64.5 inches Weight:      185 pounds Pulse rate:   77 / minute BP sitting:   110 / 65  (left arm) Cuff size:   large  Vitals Entered By: Harlene Salts (January 24, 2006 9:43 AM)                 Prior Medications: ASPIR-LOW 81 MG TBEC (ASPIRIN) Take one tablet daily CALCIUM-VITAMIN D 250-125 MG-UNIT TABS (CALCIUM CARBONATE-VITAMIN D) Take one twice a day DARVOCET-N 100  TABS (PROPOXYPHENE N-APAP TABS) Take tablet by mouth as needed ZOCOR 20 MG TABS (SIMVASTATIN) Take one tablet by mouth daily XANAX 0.5 MG TABS (ALPRAZOLAM) Take one tablet by mouth at bedtime as needed PREVACID 30 MG CPDR (LANSOPRAZOLE) Take one by mouth daily PAXIL 20 MG TABS (PAROXETINE HCL) Take one by mouth daily MULTIVITAMINS  CAPS (MULTIPLE VITAMIN) Take one by mouth daily FLUNISOLIDE 29 MCG/ACT SOLN (FLUNISOLIDE) two sprays/nostril daily TRAZODONE HCL 100 MG TABS (TRAZODONE HCL) Take 1/4 tablet at bedtime as needed CINNAMON 500 MG CAPS (CINNAMON) two times a day Current Allergies: ! CODEINE SULFATE (CODEINE SULFATE) ! SULFA ! PROZAC PCP:  Cipriano Bunker  Chief Complaint:  follow-up left leg wound.  History of Present Illness: Left leg sciatica for 3 months. Doing exercises that Dr. Danielle Dess gave her.  Also seeing chiropracter.    Wound on  left leg continues to heal.  Still tender, but no drainage or redness.  Feel ulcer is less deep now.  Fluid pills helped a great deal with LE swelling.  Kept ACE bandage on over weekend.    Physical Exam  General:     Well-developed,well-nourished,in no acute distress; alert,appropriate and cooperative throughout examination Extremities:     No LE edema Neurologic:     Patellar reflexes 2+ and symmetric.   Skin:     Left leg still 1.2cm more shallow ulcer.  Appears to be healing well.  No erythema or drainage.     Impression & Recommendations:  Problem # 1:  ULCER, LEG  (ICD-707.10) Appear improved.  Continue current regimen.  Call if not continuing to improve or if starts to get sig swelling.  Still rec keep leg elevated for 4x/day.    Problem # 2:  SCIATICA (ICD-724.3) 3 Months.  If not better will be happy to make referral for formal PT or to ortho. Weight loss and continued exercise will help.  She is walking for regular exercise.   Her updated medication list for this problem includes:    Aspir-low 81 Mg Tbec (Aspirin) .Marland Kitchen... Take one tablet daily    Darvocet-n 100 Tabs (Propoxyphene n-apap tabs) .Marland Kitchen... Take tablet by mouth as needed   Medications Added to Medication List This Visit: 1)  Flunisolide 29 Mcg/act Soln (Flunisolide) .... Two sprays/nostril daily

## 2010-03-09 NOTE — Assessment & Plan Note (Signed)
Summary: Spirometry test   Vital Signs:  Patient Profile:   70 Years Old Female Height:     64.5 inches Weight:      189 pounds O2 Sat:      97 % Temp:     97.5 degrees F oral Pulse rate:   80 / minute BP sitting:   110 / 57  (left arm) Cuff size:   regular  Vitals Entered By: Harlene Salts (June 05, 2007 4:00 PM)                 PCP:  Cipriano Bunker  Chief Complaint:  PFT'S.  History of Present Illness: Persistant cough on PPI for several months. Worse when lies down at night.  Has had a tightnes from time to time over anterior lower rib cage, but usually only happens along with some back pain.      Current Allergies: ! CODEINE SULFATE (CODEINE SULFATE) ! SULFA ! PROZAC      Physical Exam  General:     Well-developed,well-nourished,in no acute distress; alert,appropriate and cooperative throughout examination Lungs:     Normal respiratory effort, chest expands symmetrically. Lungs are clear to auscultation, no crackles or wheezes. Heart:     Normal rate and regular rhythm. S1 and S2 normal without gallop, murmur, click, rub or other extra sounds.    Impression & Recommendations:  Problem # 1:  COUGH (ICD-786.2) Spirometry results reviewed with pt.  See scanned report.  FVC adn FEV1 was normal.  SHe did have a 36% increase in response after the albuterol NEB and noticed her cough seemed better. This would indicate that she may have asthma. New Rx for an inhaller to try for next couple of weeks when coughing and see if hleps.  Will make ENT referral as previously noted. Still some concern for Reflux even though on PPI and otherwise asymptomatic.  Orders: Spirometry (Pre & Post) [id number])   Problem # 2:  MICROSCOPIC HEMATURIA (ICD-599.72) Also need to recheck for blood. + on UA. Will sen culture and micro to confirm.  IF RBCs present then will refer to Urology for further evaluation.  Orders: UA Dipstick w/o Micro (automated)  (81003) T-Urine Culture, Bacteria  (29562) T-Urine Microscopic ([id number])   Complete Medication List: 1)  Aspir-low 81 Mg Tbec (Aspirin) .... Take one tablet daily 2)  Calcium-vitamin D 250-125 Mg-unit Tabs (Calcium carbonate-vitamin d) .... Take one twice a day 3)  Tramadol Hcl 50 Mg Tabs (Tramadol hcl) .... One by mouth every 6 hours as needed pain 4)  Zocor 20 Mg Tabs (Simvastatin) .... Take one tablet by mouth daily 5)  Xanax 0.5 Mg Tabs (Alprazolam) .... Take one tablet by mouth at bedtime as needed 6)  Prevacid 30 Mg Cpdr (Lansoprazole) .... Take one by mouth daily 7)  Paxil 20 Mg Tabs (Paroxetine hcl) .... Take one by mouth daily 8)  Multivitamins Caps (Multiple vitamin) .... Take one by mouth daily 9)  Flunisolide 29 Mcg/act Soln (Flunisolide) .... Two sprays/nostril daily 10)  Trazodone Hcl 100 Mg Tabs (Trazodone hcl) .... Take 1 tablet by mouth once a day 11)  Cinnamon 500 Mg Caps (Cinnamon) .... Two times a day 12)  Bl Flax Seed Oil 1000 Mg Caps (Flaxseed (linseed)) .... Take one tablet by mouth three times a day 13)  Piroxicam 20 Mg Caps (Piroxicam) .... Take 1 tablet by mouth once a day as needed joint pain 14)  Proair Hfa 108 (90 Base) Mcg/act Aers (Albuterol sulfate) .... 2-4  puffs inhaled every 4-6 hours as needed cough or shortness of breath     Prescriptions: PROAIR HFA 108 (90 BASE) MCG/ACT  AERS (ALBUTEROL SULFATE) 2-4 puffs inhaled every 4-6 hours as needed cough or shortness of breath  #1 x 0   Entered and Authorized by:   Nani Gasser MD   Signed by:   Nani Gasser MD on 06/05/2007   Method used:   Electronically sent to ...       [facility] [address]*       [address] [place], [place]  [address]       Ph: [phone]       Fax: [phone]   RxID:   [id number]  ] Laboratory Results   Urine Tests  Date/Time Recieved: June 05, 2007 4:00 PM  Date/Time Reported: June 05, 2007 4:00 PM   Routine Urinalysis   Color: yellow Appearance: Clear Glucose: negative    (Normal Range: Negative) Bilirubin: negative   (Normal Range: Negative) Ketone: negative   (Normal Range: Negative) Spec. Gravity: <1.005   (Normal Range: 1.003-1.035) Blood: small   (Normal Range: Negative) pH: 5.5   (Normal Range: 5.0-8.0) Protein: negative   (Normal Range: Negative) Urobilinogen: 0.2   (Normal Range: 0-1) Nitrite: negative   (Normal Range: Negative) Leukocyte Esterace: small   (Normal Range: Negative)

## 2010-03-09 NOTE — Progress Notes (Signed)
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 Still feels like has a sinus infection- bad H/A, pressure behind eyes. [facility] in [place] Initial call taken by: Kathlene November,  August 20, 2008 8:45 AM  Follow-up for Phone Call        Will insurance not cover the amitiza at all?   Follow-up by: Nani Gasser MD,  August 20, 2008 9:00 AM  Additional Follow-up for Phone Call Additional follow up Details #1::        It will cover with Prior auth but there is a quantity limit she said Additional Follow-up by: Kathlene November,  August 20, 2008 9:13 AM    Additional Follow-up for Phone Call Additional follow up Details #2::    If working we can try to prior auth it. Sent over ABX to pharm.  Follow-up by: Nani Gasser MD,  August 20, 2008 10:49 AM  Additional Follow-up for Phone Call Additional follow up Details #3:: Details for Additional Follow-up Action Taken: Pt notified med sent to pharmacy Additional Follow-up by: Kathlene November,  August 20, 2008 11:09 AM  New/Updated Medications: AMOXICILLIN 500 MG CAPS (AMOXICILLIN) Take 1 tablet by mouth three times a day for 10 days AMITIZA 24 MCG CAPS (LUBIPROSTONE) Take 1 tablet by mouth two times a day for chronic constipation Prescriptions: AMITIZA 24 MCG CAPS (LUBIPROSTONE) Take 1 tablet by mouth two times a day for chronic constipation  #60 x 1   Entered and Authorized by:   Nani Gasser MD   Signed by:   Nani Gasser MD on 08/20/2008   Method used:   Electronically to        [facility] [phone]* (retail)       [address] [place], [place]  [address]       Ph: [phone]       Fax: [phone]   RxID:   [id number] AMOXICILLIN 500 MG CAPS (AMOXICILLIN) Take 1 tablet by mouth three times a day for 10 days   #30 x 0   Entered and Authorized by:   Nani Gasser MD   Signed by:   Nani Gasser MD on 08/20/2008   Method used:   Electronically to        [facility] [phone]* (retail)       [address] [place], [place]  [address]       Ph: [phone]       Fax: [phone]   RxID:   [id number]

## 2010-03-09 NOTE — Letter (Signed)
Summary: Instructions/[facility]  Instructions/[facility]   Imported By: Lanelle Bal 10/22/2009 11:30:49  _____________________________________________________________________  External Attachment:    Type:   Image     Comment:   External Document

## 2010-03-09 NOTE — Assessment & Plan Note (Signed)
Summary: Nuclear Study  Nuclear Med Background History: Echo, GXT Symptoms: Chest Tightness, Dizziness, Nausea, Palpitations, Rapid HR, SOB  Nuclear Pre-Procedure Cardiac Risk Factors: History of Smoking, Lipids, Obesity Caffeine/Decaff Intake: none NPO After: 5:00 PM Lungs: clear IV 0.9% NS with Angio Cath: 22g     IV Site: (R) AC IV Started by: Irean Hong RN Chest Size (in) 38     Cup Size C     Height (in): 64.5 Weight (lb): 193 BMI: 32.73 in-lbs   Nuclear Med Study 1 or 2 day study:  1 day     Stress Test Type:  Treadmill/Adenosine Reading MD:  Olga Millers, MD     Referring MD:  Nani Gasser Resting Radionuclide:  Technetium 71m Tetrofosmin     Resting Radionuclide Dose:  10 mCi  Stress Radionuclide:  Technetium 33m Tetrofosmin     Stress Radionuclide Dose:  33 mCi   Stress Protocol Exercise Time (min):  4:00 min     Max HR:  107 bpm Max Systolic BP: 118 mm HgRate Pressure Product:  04540 Dose of Adenosine:  49.1 mg    Stress Test Technologist:  [employer]a [employer] CMA-N     Nuclear Technologist:  Harlow Asa CNMT  Rest Procedure  Myocardial perfusion imaging was performed at rest 45 minutes following the intraveneous administration of Myoview Technetium 58m Tetrofosmin.  Stress Procedure  The patient received IV adenosine at 140 mcg/kg/min for 4-minutes with concurrent submaximal exercise (1.81mph, 0% grade). There were no significant changes with infusion. She did c/o chest/neck tightness with infusion. Myoview was injected at the 2-minute mark and quantitative spect images were obtained.  QPS Raw Data Images:  Acuisition technically good; normal left ventricular size. Stress Images:  There is normal uptake in all areas. Rest Images:  Normal homogeneous uptake in all areas of the myocardium. Subtraction (SDS):  No evidence of ischemia. Transient Ischemic Dilatation:  1.18  (Normal <1.22)  Lung/Heart Ratio:  .12  (Normal <0.45)  Quantitative Gated Spect  Images QGS EDV:  61 ml QGS ESV:  24 ml QGS EF:  61 % QGS cine images:  Normal wall motion   Overall Impression Exercise Capacity: Adenosine with low level exercise. ECG Impression: No significant ST segment change suggestive of ischemia. Overall Impression: There is no sign of scar or ischemia.

## 2010-03-09 NOTE — Progress Notes (Signed)
Summary: RX SENT TO [facility]  Phone Note From Pharmacy   Caller: [facility] Call For: DR. Linford Arnold  Summary of Call: PT CALLED PHARM AND DISCUSSED COST AND RX WITH PHARM. PT WOULD LIKE RX SENT TO [facility]. Initial call taken by: Payton Spark CMA,  May 28, 2008 4:08 PM    New/Updated Medications: * COMPOUNDED THYROID 0.25 GRAIN Take 1 tablet by mouth once a day on an empty stomach   Prescriptions: COMPOUNDED THYROID 0.25 GRAIN Take 1 tablet by mouth once a day on an empty stomach  #30 x 0   Entered and Authorized by:   Nani Gasser MD   Signed by:   Nani Gasser MD on 05/28/2008   Method used:   Printed then faxed to ...       [address] [phone]* (retail)       [address] [place], [place]  [address]       Ph: [phone]       Fax: [phone]   RxID:   [id number]

## 2010-03-09 NOTE — Assessment & Plan Note (Signed)
Summary: CPE, FLU SHOT NO PAP   Vital Signs:  Patient profile:   70 year old female Height:      64.5 inches Weight:      194 pounds Pulse rate:   88 / minute BP sitting:   127 / 62  (left arm) Cuff size:   large  Vitals Entered By: Kathlene November (December 15, 2008 9:32 AM) CC: CPE no pap Flu Vaccine Consent Questions     Do you have a history of severe allergic reactions to this vaccine? no    Any prior history of allergic reactions to egg and/or gelatin? no    Do you have a sensitivity to the preservative Thimersol? no    Do you have a past history of Guillan-Barre Syndrome? no    Do you currently have an acute febrile illness? no    Have you ever had a severe reaction to latex? no    Vaccine information given and explained to patient? yes    Are you currently pregnant? no    Lot Number:[id number]   Exp Date:08/06/2009   Site Given  Left Deltoid IM   Primary Care Provider:  Alden Hipp, MD  CC:  CPE no pap.  History of Present Illness: Here for CPE. No specific concerns. Would like her A1C rechecked this year.   Current Medications (verified): 1)  Aspir-Low 81 Mg Tbec (Aspirin) .... Take One Tablet Daily 2)  Calcium-Vitamin D 250-125 Mg-Unit Tabs (Calcium Carbonate-Vitamin D) .... Take One Twice A Day 3)  Zocor 20 Mg Tabs (Simvastatin) .... Take One Tablet By Mouth Daily 4)  Xanax 0.5 Mg Tabs (Alprazolam) .... Take 1 Tablet By Mouth Two Times A Day As Needed 5)  Omeprazole 20 Mg Tbec (Omeprazole) .... Take 1 Tablet By Mouth Two Times A Day 6)  Multivitamins  Caps (Multiple Vitamin) .... Take One By Mouth Daily 7)  Flunisolide 29 Mcg/act Soln (Flunisolide) .... Two Sprays/nostril Daily 8)  Bl Flax Seed Oil 1000 Mg  Caps (Flaxseed (Linseed)) .... Take One Tablet By Mouth Three Times A Day 9)  Miralax   Powd (Polyethylene Glycol 3350) .... One Capful Once Daily As Needed Constipation 10)  Stool Softener 100 Mg Caps (Docusate Sodium) .Marland Kitchen.. 1 By Mouth Qlunchtime and 2 By  Mouth At Bedtime 11)  Vitamin D3 1000 Unit Tabs (Cholecalciferol) .Marland Kitchen.. 1 By Mouth Once Daily 12)  Claritin 10 Mg Tabs (Loratadine) .Marland Kitchen.. 1 By Mouth Once Daily 13)  Tens Unit .... As Directed 14)  Darvocet-N 100 100-650 Mg Tabs (Propoxyphene N-Apap) .Marland Kitchen.. 1 Tab By Mouth Three Times A Day As Needed Severe Pain 15)  Zolpidem Tartrate 5 Mg Tabs (Zolpidem Tartrate) .... Take 1 Tablet By Mouth Once A Day At Bedtime 16)  Dexamethasone 1 Mg Tabs (Dexamethasone) .... Take One At 11pm By Mouth and Then Have Blood Draw The Next Day At 8am. 17)  Amoxicillin 500 Mg Caps (Amoxicillin) .... Take 1 Tablet By Mouth Three Times A Day For 10 Days 18)  Amitiza 24 Mcg Caps (Lubiprostone) 19)  Paxil 40 Mg Tabs (Paroxetine Hcl) .... Take One Tablet By Mouth Once A Day 20)  Trazodone Hcl 50 Mg Tabs (Trazodone Hcl) .... 1/2-1 Tab By Mouth Once A Day At Bedtime.  Allergies (verified): 1)  ! Codeine Sulfate (Codeine Sulfate) 2)  ! Sulfa 3)  ! Prozac  Comments:  Nurse/Medical Assistant: The patient's medications and allergies were reviewed with the patient and were updated in the Medication and Allergy Lists.  Kathlene November (December 15, 2008 9:33 AM)  Past History:  Past Medical History: Last updated: 04/06/2008 3 bulging disks, mild spinal stenosis,  L4 affected Hx of Diverticuli, colon,  Lung nodules, calcified granulomas, chronic ABIs were normal at screen done on 03/31/06 Arthritis Depression/Anxiety GERD Irritable Bowel Syndrome uterine cancer s/p hysterectomy  Past Surgical History: Appendectomy  1960s,  EGD/colonoscopy-normal in2002,  Hysterectomy 1973, s/p utereine Ca tonsilectomy as a child Left shoulder surgery - Dr. Lajoyce Corners.  bilat cataracts.   Family History: Reviewed history from 04/06/2008 and no changes required. Alcoholism, heart disease-Father, Brother Mother had RA, kidney failure Father had psoriasis, lung cancer Son with Lung Ca, HIV  Matt deceased fall July 19, 2007 Family History of  Crohn's: Brother  Social History: Reviewed history from 04/06/2008 and no changes required. 12 yrs education. currently separated from husband Elijah Birk (who was incarcarated)  4 adult chldren of which 3 are living.  Quit smoking 1995, no drugs, no EtOH, approximately 3 caffeinated drinks per day, Stretches daily and goes for walks.  active in her church.  used to work in book keeping  Review of Systems  The patient denies anorexia, fever, weight loss, weight gain, vision loss, decreased hearing, hoarseness, chest pain, syncope, dyspnea on exertion, peripheral edema, prolonged cough, headaches, hemoptysis, abdominal pain, melena, hematochezia, severe indigestion/heartburn, hematuria, incontinence, genital sores, muscle weakness, suspicious skin lesions, transient blindness, difficulty walking, depression, unusual weight change, abnormal bleeding, enlarged lymph nodes, and breast masses.    Physical Exam  General:  Well-developed,well-nourished,in no acute distress; alert,appropriate and cooperative throughout examination Head:  Normocephalic and atraumatic without obvious abnormalities. No apparent alopecia or balding. Eyes:  No corneal or conjunctival inflammation noted. EOMI. Perrla.  Ears:  External ear exam shows no significant lesions or deformities.  Otoscopic examination reveals clear canals, tympanic membranes are intact bilaterally without bulging, retraction, inflammation or discharge. Hearing is grossly normal bilaterally. Nose:  External nasal examination shows no deformity or inflammation.  Mouth:  Oral mucosa and oropharynx without lesions or exudates.  Teeth in good repair. Neck:  No deformities, masses, or tenderness noted. Chest Wall:  No deformities, masses, or tenderness noted. Lungs:  Normal respiratory effort, chest expands symmetrically. Lungs are clear to auscultation, no crackles or wheezes. Heart:  Normal rate and regular rhythm. S1 and S2 normal without gallop, murmur, click,  rub or other extra sounds. Abdomen:  Bowel sounds positive,abdomen soft and without masses, organomegaly or hernias noted. Mildy tender int eh RUQ and the LLQ.  Msk:  No deformity or scoliosis noted of thoracic or lumbar spine.   Pulses:  R and L carotid,radial,dorsalis pedis and posterior tibial pulses are full and equal bilaterally Extremities:  No clubbing, cyanosis, edema, or deformity noted with normal full range of motion of all joints.   Neurologic:  No cranial nerve deficits noted. Station and gait are normal.. Sensory, motor and coordinative functions appear intact. Skin:  no rashes.   Cervical Nodes:  No lymphadenopathy noted Axillary Nodes:  No palpable lymphadenopathy Psych:  Cognition and judgment appear intact. Alert and cooperative with normal attention span and concentration. No apparent delusions, illusions, hallucinations   Impression & Recommendations:  Problem # 1:  EXAMINATION, ROUTINE MEDICAL (ICD-V70.0) Exam is normal. She just her her mammogram which was normal.  Flu vac given today.  Call with any concerns.  Cotninue to work on diet and exercise and weight loss.  Make sure takiing calcium regularly.  Consider shingles vaccine. Will give her a rx to take to  the  Due for choleserol recheck.   Problem # 2:  ANXIETY (ICD-300.00) Doing well on decreased dose of paxil. Says she feels eh has a good balacne with her psych meds right now.  Her updated medication list for this problem includes:    Xanax 0.5 Mg Tabs (Alprazolam) .Marland Kitchen... Take 1 tablet by mouth two times a day as needed    Paroxetine Hcl 20 Mg Tabs (Paroxetine hcl) .Marland Kitchen... Take 1 tablet by mouth once a day    Trazodone Hcl 50 Mg Tabs (Trazodone hcl) .Marland Kitchen... 1/2-1 tab by mouth once a day at bedtime.  Complete Medication List: 1)  Aspir-low 81 Mg Tbec (Aspirin) .... Take one tablet daily 2)  Calcium-vitamin D 250-125 Mg-unit Tabs (Calcium carbonate-vitamin d) .... Take one twice a day 3)  Zocor 20 Mg Tabs  (Simvastatin) .... Take one tablet by mouth daily 4)  Xanax 0.5 Mg Tabs (Alprazolam) .... Take 1 tablet by mouth two times a day as needed 5)  Omeprazole 20 Mg Tbec (Omeprazole) .... Take 1 tablet by mouth two times a day 6)  Multivitamins Caps (Multiple vitamin) .... Take one by mouth daily 7)  Flunisolide 29 Mcg/act Soln (Flunisolide) .... Two sprays/nostril daily 8)  Bl Flax Seed Oil 1000 Mg Caps (Flaxseed (linseed)) .... Take one tablet by mouth three times a day 9)  Miralax Powd (Polyethylene glycol 3350) .... One capful once daily as needed constipation 10)  Stool Softener 100 Mg Caps (Docusate sodium) .Marland Kitchen.. 1 by mouth qlunchtime and 2 by mouth at bedtime 11)  Vitamin D3 1000 Unit Tabs (Cholecalciferol) .Marland Kitchen.. 1 by mouth once daily 12)  Claritin 10 Mg Tabs (Loratadine) .Marland Kitchen.. 1 by mouth once daily 13)  Darvocet-n 100 100-650 Mg Tabs (Propoxyphene n-apap) .Marland Kitchen.. 1 tab by mouth three times a day as needed severe pain 14)  Zolpidem Tartrate 5 Mg Tabs (Zolpidem tartrate) .... Take 1 tablet by mouth once a day at bedtime 15)  Dexamethasone 1 Mg Tabs (Dexamethasone) .... Take one at 11pm by mouth and then have blood draw the next day at 8am. 16)  Amoxicillin 500 Mg Caps (Amoxicillin) .... Take 1 tablet by mouth three times a day for 10 days 17)  Amitiza 24 Mcg Caps (Lubiprostone) 18)  Paroxetine Hcl 20 Mg Tabs (Paroxetine hcl) .... Take 1 tablet by mouth once a day 19)  Trazodone Hcl 50 Mg Tabs (Trazodone hcl) .... 1/2-1 tab by mouth once a day at bedtime. 20)  Zostavax 16109 Unt/0.46ml Solr (Zoster vaccine live) .... Give im x 1  Other Orders: Flu Vaccine 42yrs + (60454) Administration Flu vaccine - MCR (U9811) T-Basic Metabolic Panel ([id number]) T-Comprehensive Metabolic Panel ([id number]) T-Comprehensive Metabolic Panel (96295-28413) T- * Misc. Laboratory test [phone]) T-Lipid Profile [phone])   Patient Instructions: 1)  Please schedule a follow-up appointment in 3-4  months .    Prescriptions: ZOSTAVAX 03474 UNT/0.65ML SOLR (ZOSTER VACCINE LIVE) Give IM x 1  #1 x 0   Entered and Authorized by:   Nani Gasser MD   Signed by:   Nani Gasser MD on 12/15/2008   Method used:   Printed then faxed to ...       [address] [phone]* (retail)       [address] [place], [place]  [address]       Ph: [phone]       Fax: [phone]   RxID:   [id number]

## 2010-03-09 NOTE — Progress Notes (Signed)
Summary: tramadol, bone density  Phone Note Call from Patient   Caller: Patient Call For: Cheryl [name] Summary of Call: Tramadol 100 mg put her in teir 4 category so pt wants [name] to cancel the 100mg   and refill the 50mg  Also, imaging needs bone density order signed and faxed to them @ [phone]. Test will be done tomorrow. Order has been printed. Nicki Guadalajara Fergerson CMA Duncan Dull)  December 21, 2009 4:44 PM  Initial call taken by: Avon Gully CMA, Duncan Dull),  December 21, 2009 2:03 PM  Follow-up for Phone Call        Rx Called In Follow-up by: Cheryl [name],  December 22, 2009 7:52 AM    New/Updated Medications: TRAMADOL HCL 50 MG TABS (TRAMADOL HCL) 1-2 tabs by mouth up to three times a day as needed pain Prescriptions: TRAMADOL HCL 50 MG TABS (TRAMADOL HCL) 1-2 tabs by mouth up to three times a day as needed pain  #120 x 1   Entered and Authorized by:   Cheryl [name]   Signed by:   Cheryl [name] on 12/22/2009   Method used:   Electronically to        [facility] [phone]* (retail)       [address] [place], [place]  [address]       Ph: [phone]       Fax: [phone]   RxID:   [id number]

## 2010-03-09 NOTE — Letter (Signed)
Summary: Orthopedic Note  Orthopedic Note   Imported By: Kathlene November 06/26/2007 09:13:30  _____________________________________________________________________  External Attachment:    Type:   Image     Comment:   External Document

## 2010-03-09 NOTE — Consult Note (Signed)
Summary: [facility] [employer] [facility] [place]   Imported By: Lanelle Bal 05/07/2009 10:56:42  _____________________________________________________________________  External Attachment:    Type:   Image     Comment:   External Document

## 2010-03-09 NOTE — Progress Notes (Signed)
Summary: Darvocet refill  Phone Note Call from Patient   Caller: Patient Summary of Call: Pt LMOVM- Pt states she has not had to take the Darvocet in a while but now has pain would like it refilled. Pt contacted Pharm for them to send [name] a refill request. Initial call taken by: Payton Spark CMA,  June 06, 2008 2:42 PM  Follow-up for Phone Call        filled #30 but she needs OV if this needs to be continued or there is a new problem. Follow-up by: Seymour Bars DO,  June 06, 2008 2:50 PM    New/Updated Medications: DARVOCET-N 100 100-650 MG TABS (PROPOXYPHENE N-APAP) 1 tab by mouth three times a day as needed severe pain   Prescriptions: DARVOCET-N 100 100-650 MG TABS (PROPOXYPHENE N-APAP) 1 tab by mouth three times a day as needed severe pain  #30 x 0   Entered and Authorized by:   Seymour Bars DO   Signed by:   Seymour Bars DO on 06/06/2008   Method used:   Printed then faxed to ...       [address] [phone]* (retail)       [address] [place], [place]  [address]       Ph: [phone]       Fax: [phone]   RxID:   [id number]   Appended Document: Darvocet refill Pt states she is scheduled for spine injections in June and hopes to not need pain meds after that.

## 2010-03-09 NOTE — Progress Notes (Signed)
Summary: HEART FAST,NERVOUS  Phone Note Call from Patient Call back at Home Phone [phone] Call back at [phone]   Caller: Patient Call For: Cheryl Gasser MD Summary of Call: PATIENT CALLED TO SAY THAT SHE'S BEEN HAVING SOME NERVOUS LIKE FEELINGS,AND FEELS LIKE HEART IS BEATING FAST. ALSO STATED SHE STARTED THYROID MED 2WEEKS AGO. PLEASE ADVISE. Initial call taken by: Harlene Salts,  Jun 12, 2008 5:02 PM  Follow-up for Phone Call        Yes, likely the thyroid med.  It may be too strong. We can reduce the dose if would like.  Follow-up by: Cheryl Gasser MD,  Jun 12, 2008 5:15 PM  Additional Follow-up for Phone Call Additional follow up Details #1::        patient informed via message machine. Additional Follow-up by: Harlene Salts,  Jun 12, 2008 5:44 PM    Additional Follow-up for Phone Call Additional follow up Details #2::    patient called and said she would be willing to change to a lower dose but she spoke with pharmacist that made her thyroid capsule and he said that the dose she's on shouldn't be giving her the adrenaline rush.  Follow-up by: Harlene Salts,  Jun 13, 2008 10:38 AM  Additional Follow-up for Phone Call Additional follow up Details #3:: Details for Additional Follow-up Action Taken: Does she feel like it is her nerves? --06/13/08 @12 :49pm patient doesn't think its her nerves since she's in good spirit and nothing stressful is going on now.LM  Additional Follow-up by: Cheryl Gasser MD,  Jun 13, 2008 11:46 AM  New/Updated Medications: ATENOLOL 25 MG TABS (ATENOLOL) Take 1 tablet by mouth once a day May  7, 20101:00 PM Since recently had a norma stress test lets try a betablocker to help with the heart racinga dn nervous symptoms for 2 weeks nad see if helps.  Goldia Ligman MD, Jiles Goya---06/13/08 @1 :53PM patient doesn't want to start now would like to see if symptoms go away and will let us know is she has to start rx.LM    Prescriptions: ATENOLOL 25 MG TABS (ATENOLOL) Take 1 tablet by mouth once a day  #30 x 0   Entered and Authorized by:   Cheryl Gasser MD   Signed by:   Cheryl Gasser MD on 06/13/2008   Method used:   Electronically to        [facility] [phone]* (retail)       [address] [place], [place]  [address]       Ph: [phone]       Fax: [phone]   RxID:   [id number]

## 2010-03-09 NOTE — Letter (Signed)
Summary: Hormonal Symptoms List/[place]  Hormonal Symptoms List/[employer] [facility] [place]   Imported By: Lanelle Bal 05/31/2008 11:35:49  _____________________________________________________________________  External Attachment:    Type:   Image     Comment:   External Document

## 2010-03-09 NOTE — Letter (Signed)
Summary: [facility]  [facility]   Imported By: Lanelle Bal 09/14/2009 13:56:51  _____________________________________________________________________  External Attachment:    Type:   Image     Comment:   External Document

## 2010-03-11 NOTE — Assessment & Plan Note (Signed)
Summary: Palpitations.    Vital Signs:  Patient profile:   70 year old female Height:      64.5 inches Weight:      177 pounds Pulse rate:   74 / minute BP sitting:   92 / 55  (right arm) Cuff size:   regular  Vitals Entered By: Avon Gully CMA, (AAMA) (February 11, 2010 2:09 PM) CC: flutters in the chest x 2 weeks, have increased over the two weeks ,denies pain   Primary Care Provider:  Alden Hipp, MD  CC:  flutters in the chest x 2 weeks, have increased over the two weeks , and denies pain.  History of Present Illness: Flutters started about 2 weeks ago. Was sitting initally.  Second week happened more frequently.  Lasts second. No dizziness or lightheaded or SOB.  No CP. NO sweating. Happening daily. Happening 12-15 times a day.  NO known trigger.s  No alleviating sxs. No caffeine. Hasn't been sleeping well. Has been taking xanax  at night otherise no new meds. Had a normal stress test in 04/2008 withDr Crenshaw.  She feels the sensation in her epigastric area slighly to the left. she says her bowels are moving normally.  She does note that she has been more gassy and bloated since she had her surgery.  Current Medications (verified): 1)  Aspir-Low 81 Mg Tbec (Aspirin) .... Take One Tablet Daily 2)  Calcium-Vitamin D 250-125 Mg-Unit Tabs (Calcium Carbonate-Vitamin D) .... Take One Twice A Day 3)  Zocor 20 Mg Tabs (Simvastatin) .... Take One Tablet By Mouth Daily 4)  Xanax 0.5 Mg Tabs (Alprazolam) .... Take 1 Tablet By Mouth Two Times A Day As Needed 5)  Multivitamins  Caps (Multiple Vitamin) .... Take One By Mouth Daily 6)  Bl Flax Seed Oil 1000 Mg  Caps (Flaxseed (Linseed)) .... Take One Tablet By Mouth Three Times A Day 7)  Miralax   Powd (Polyethylene Glycol 3350) .... One Capful Once Daily As Needed Constipation 8)  Stool Softener 100 Mg Caps (Docusate Sodium) .Marland Kitchen.. 1 By Mouth Qlunchtime and 2 By Mouth At Bedtime 9)  Vitamin D3 1000 Unit Tabs (Cholecalciferol) .Marland Kitchen.. 1  By Mouth Once Daily 10)  Paroxetine Hcl 20 Mg Tabs (Paroxetine Hcl) .... Take 1 Tablet By Mouth Once A Day 11)  Trazodone Hcl 50 Mg Tabs (Trazodone Hcl) .... 1/2-1 Tab By Mouth Once A Day At Bedtime. 12)  Tramadol Hcl 50 Mg Tabs (Tramadol Hcl) .Marland Kitchen.. 1-2 Tabs By Mouth Up To Three Times A Day As Needed Pain 13)  Omeprazole 20 Mg Cpdr (Omeprazole) .... Take One Capsule By Mouth Twice A Day 14)  Amitiza 8 Mcg Caps (Lubiprostone) .... 2 Tabs By Mouth Two Times A Day  Allergies (verified): 1)  ! Codeine Sulfate (Codeine Sulfate) 2)  ! Sulfa 3)  ! Prozac  Comments:  Nurse/Medical Assistant: The patient's medications and allergies were reviewed with the patient and were updated in the Medication and Allergy Lists. Avon Gully CMA, Duncan Dull) (February 11, 2010 2:10 PM)  Past History:  Past Medical History: Last updated: 04/06/2008 3 bulging disks, mild spinal stenosis,  L4 affected Hx of Diverticuli, colon,  Lung nodules, calcified granulomas, chronic ABIs were normal at screen done on 03/31/06 Arthritis Depression/Anxiety GERD Irritable Bowel Syndrome uterine cancer s/p hysterectomy  Past Surgical History: Last updated: 12/15/2008 Appendectomy  1960s,  EGD/colonoscopy-normal in2002,  Hysterectomy 1973, s/p utereine Ca tonsilectomy as a child Left shoulder surgery - Dr. Lajoyce Corners.  bilat cataracts.  Family History: Reviewed history from 03/16/2009 and no changes required. Alcoholism, heart disease-Father, Brother Mother had RA, kidney failure Father had psoriasis, lung cancer Son with Lung Ca, HIV  Matt deceased fall 07-09-07 Family History of Crohn's: Brother Therapist, art with SLE and CHF.   Social History: Reviewed history from 04/06/2008 and no changes required. 12 yrs education. currently separated from husband Elijah Birk (who was incarcarated)  4 adult chldren of which 3 are living.  Quit smoking 1995, no drugs, no EtOH, approximately 3 caffeinated drinks per day, Stretches daily and goes  for walks.  active in her church.  used to work in book keeping  Physical Exam  General:  Well-developed,well-nourished,in no acute distress; alert,appropriate and cooperative throughout examination Head:  Normocephalic and atraumatic without obvious abnormalities. No apparent alopecia or balding. Eyes:  No corneal or conjunctival inflammation noted. EOMI. Perrla. Funduscopic exam benign, without hemorrhages, exudates or papilledema. Vision grossly normal. Lungs:  Normal respiratory effort, chest expands symmetrically. Lungs are clear to auscultation, no crackles or wheezes. Heart:  Normal rate and regular rhythm. S1 and S2 normal without gallop, murmur, click, rub or other extra sounds. Pulses:  radial 2+  Extremities:  NO LE edema.  Skin:  no rashes.   Cervical Nodes:  No lymphadenopathy noted Psych:  Cognition and judgment appear intact. Alert and cooperative with normal attention span and concentration. No apparent delusions, illusions, hallucinations   Impression & Recommendations:  Problem # 1:  PALPITATIONS (ICD-785.1) Would like to set up for cardiac montior. She had a neg stress test 2 years ago. Lbs to rule out anemia, thyroid d/o etc. in the meantime I discussed with her that if she gets any chest pain or if the sensation persists for more than 5 minutes that she needs to do to the emergency department.  Patient agrees and understands. Orders: T-TSH [phone]) T-CBC No Diff ([id number]) T-Comprehensive Metabolic Panel ([id number]) EKG w/ Interpretation (93000) T-Arrythmia Monitor Complete (57846)  Complete Medication List: 1)  Aspir-low 81 Mg Tbec (Aspirin) .... Take one tablet daily 2)  Calcium-vitamin D 250-125 Mg-unit Tabs (Calcium carbonate-vitamin d) .... Take one twice a day 3)  Zocor 20 Mg Tabs (Simvastatin) .... Take one tablet by mouth daily 4)  Xanax 0.5 Mg Tabs (Alprazolam) .... Take 1 tablet by mouth two times a day as needed 5)  Multivitamins Caps  (Multiple vitamin) .... Take one by mouth daily 6)  Bl Flax Seed Oil 1000 Mg Caps (Flaxseed (linseed)) .... Take one tablet by mouth three times a day 7)  Miralax Powd (Polyethylene glycol 3350) .... One capful once daily as needed constipation 8)  Stool Softener 100 Mg Caps (Docusate sodium) .Marland Kitchen.. 1 by mouth qlunchtime and 2 by mouth at bedtime 9)  Vitamin D3 1000 Unit Tabs (Cholecalciferol) .Marland Kitchen.. 1 by mouth once daily 10)  Paroxetine Hcl 20 Mg Tabs (Paroxetine hcl) .... Take 1 tablet by mouth once a day 11)  Trazodone Hcl 50 Mg Tabs (Trazodone hcl) .... 1/2-1 tab by mouth once a day at bedtime. 12)  Tramadol Hcl 50 Mg Tabs (Tramadol hcl) .Marland Kitchen.. 1-2 tabs by mouth up to three times a day as needed pain 13)  Omeprazole 20 Mg Cpdr (Omeprazole) .... Take one capsule by mouth twice a day 14)  Amitiza 8 Mcg Caps (Lubiprostone) .... 2 tabs by mouth two times a day  Patient Instructions: 1)  We will get you set up for the cardiac monitor.    Orders Added: 1)  T-TSH [[id number]] 2)  T-CBC  No Diff [[id number]] 3)  T-Comprehensive Metabolic Panel [[id number]] 4)  Est. Patient Level IV [16109] 5)  EKG w/ Interpretation [93000] 6)  T-Arrythmia Monitor Complete [93268]  Appended Document: Palpitations.  EKG shows rate of 68 bpm, NSR, no acute changes.

## 2010-03-11 NOTE — Assessment & Plan Note (Signed)
Summary: Pharyngitis   Vital Signs:  Patient profile:   70 year old female Height:      64.5 inches Weight:      174 pounds Temp:     98.1 degrees F Pulse rate:   89 / minute BP sitting:   119 / 63  (right arm) Cuff size:   regular  Vitals Entered By: Avon Gully CMA, Duncan Dull) (February 22, 2010 11:27 AM) CC: sore throat since sat   Primary Care Provider:  Alden Hipp, MD  CC:  sore throat since sat.  History of Present Illness: ST for 3 days. Feels achey. No fever or Sinus  sxs. No ear pain.  No GI sxs.  Getting some phelgme in her throat. Painful to swallow.  No known exposure to sterp. Son in law has sinusitis. Has been taking vitamin C and zinc lozenges.   Current Medications (verified): 1)  Aspir-Low 81 Mg Tbec (Aspirin) .... Take One Tablet Daily 2)  Calcium-Vitamin D 250-125 Mg-Unit Tabs (Calcium Carbonate-Vitamin D) .... Take One Twice A Day 3)  Zocor 20 Mg Tabs (Simvastatin) .... Take One Tablet By Mouth Daily 4)  Xanax 0.5 Mg Tabs (Alprazolam) .... Take 1 Tablet By Mouth Two Times A Day As Needed 5)  Multivitamins  Caps (Multiple Vitamin) .... Take One By Mouth Daily 6)  Bl Flax Seed Oil 1000 Mg  Caps (Flaxseed (Linseed)) .... Take One Tablet By Mouth Three Times A Day 7)  Miralax   Powd (Polyethylene Glycol 3350) .... One Capful Once Daily As Needed Constipation 8)  Stool Softener 100 Mg Caps (Docusate Sodium) .Marland Kitchen.. 1 By Mouth Qlunchtime and 2 By Mouth At Bedtime 9)  Vitamin D3 1000 Unit Tabs (Cholecalciferol) .Marland Kitchen.. 1 By Mouth Once Daily 10)  Paroxetine Hcl 20 Mg Tabs (Paroxetine Hcl) .... Take 1 Tablet By Mouth Once A Day 11)  Trazodone Hcl 50 Mg Tabs (Trazodone Hcl) .... 1/2-1 Tab By Mouth Once A Day At Bedtime. 12)  Tramadol Hcl 50 Mg Tabs (Tramadol Hcl) .Marland Kitchen.. 1-2 Tabs By Mouth Up To Three Times A Day As Needed Pain 13)  Omeprazole 20 Mg Cpdr (Omeprazole) .... Take One Capsule By Mouth Twice A Day 14)  Amitiza 8 Mcg Caps (Lubiprostone) .... 2 Tabs By Mouth Two  Times A Day  Allergies (verified): 1)  ! Codeine Sulfate (Codeine Sulfate) 2)  ! Sulfa 3)  ! Prozac  Comments:  Nurse/Medical Assistant: The patient's medications and allergies were reviewed with the patient and were updated in the Medication and Allergy Lists. Avon Gully CMA, Duncan Dull) (February 22, 2010 11:28 AM)  Physical Exam  General:  Well-developed,well-nourished,in no acute distress; alert,appropriate and cooperative throughout examination Head:  Normocephalic and atraumatic without obvious abnormalities. No apparent alopecia or balding. Eyes:  No corneal or conjunctival inflammation noted. EOMI. Perrla. Ears:  External ear exam shows no significant lesions or deformities.  Otoscopic examination reveals clear canals, tympanic membranes are intact bilaterally without bulging, retraction, inflammation or discharge. Hearing is grossly normal bilaterally. Nose:  External nasal examination shows no deformity or inflammation. Nasal mucosa are pink and moist without lesions or exudates. Mouth:  Op is erythematous and cobblestoned.  Neck:  No deformities, masses, or tenderness noted. Lungs:  Normal respiratory effort, chest expands symmetrically. Lungs are clear to auscultation, no crackles or wheezes. Heart:  Normal rate and regular rhythm. S1 and S2 normal without gallop, murmur, click, rub or other extra sounds. Skin:  no rashes.   Cervical Nodes:  No lymphadenopathy noted  Psych:  Cognition and judgment appear intact. Alert and cooperative with normal attention span and concentration. No apparent delusions, illusions, hallucinations   Impression & Recommendations:  Problem # 1:  PHARYNGITIS, ACUTE (ICD-462)  Her updated medication list for this problem includes:    Aspir-low 81 Mg Tbec (Aspirin) .Marland Kitchen... Take one tablet daily  Instructed to complete antibiotics and call if not improved in 48 hours. Rapid is neg but since ST is her primary sxs ans she her throat is red. will send  a culture.   Orders: T-Culture, Throat ([id number]) Rapid Strep (29562)  Complete Medication List: 1)  Aspir-low 81 Mg Tbec (Aspirin) .... Take one tablet daily 2)  Calcium-vitamin D 250-125 Mg-unit Tabs (Calcium carbonate-vitamin d) .... Take one twice a day 3)  Zocor 20 Mg Tabs (Simvastatin) .... Take one tablet by mouth daily 4)  Xanax 0.5 Mg Tabs (Alprazolam) .... Take 1 tablet by mouth two times a day as needed 5)  Multivitamins Caps (Multiple vitamin) .... Take one by mouth daily 6)  Bl Flax Seed Oil 1000 Mg Caps (Flaxseed (linseed)) .... Take one tablet by mouth three times a day 7)  Miralax Powd (Polyethylene glycol 3350) .... One capful once daily as needed constipation 8)  Stool Softener 100 Mg Caps (Docusate sodium) .Marland Kitchen.. 1 by mouth qlunchtime and 2 by mouth at bedtime 9)  Vitamin D3 1000 Unit Tabs (Cholecalciferol) .Marland Kitchen.. 1 by mouth once daily 10)  Paroxetine Hcl 20 Mg Tabs (Paroxetine hcl) .... Take 1 tablet by mouth once a day 11)  Trazodone Hcl 50 Mg Tabs (Trazodone hcl) .... 1/2-1 tab by mouth once a day at bedtime. 12)  Tramadol Hcl 50 Mg Tabs (Tramadol hcl) .Marland Kitchen.. 1-2 tabs by mouth up to three times a day as needed pain 13)  Omeprazole 20 Mg Cpdr (Omeprazole) .... Take one capsule by mouth twice a day 14)  Amitiza 8 Mcg Caps (Lubiprostone) .... 2 tabs by mouth two times a day  Patient Instructions: 1)  Salt water gargles.  2)  We will call you with the throat culture.    Orders Added: 1)  T-Culture, Throat [[id number]] 2)  Est. Patient Level III [96295] 3)  Rapid Strep [[id number]]    Laboratory Results  Date/Time Received: 02/22/10 Date/Time Reported: 02/22/10  Other Tests  Rapid Strep: negative

## 2010-03-11 NOTE — Assessment & Plan Note (Signed)
Summary: Sinusitis   Vital Signs:  Patient profile:   70 year old female Height:      64.5 inches Weight:      178 pounds O2 Sat:      96 % on Room air Pulse rate:   87 / minute BP sitting:   105 / 62  (right arm) Cuff size:   regular  Vitals Entered By: Avon Gully CMA, Duncan Dull) (March 05, 2010 3:18 PM)  O2 Flow:  Room air CC: not feeling well, fatigue,sometimes feel SOB   Primary Care Provider:  Alden Hipp, MD  CC:  not feeling well, fatigue, and sometimes feel SOB.  History of Present Illness: not feeling well, fatigue,sometimes feel SOB. Still  alot of pressure below her eyes and ear and still cough with now yellow phlegm. She is worried she now has PNA.  Throat is red.  No fever.  No meds.  + nausea. No vomiting. No worsening or alleviaiting sxs.   Current Medications (verified): 1)  Aspir-Low 81 Mg Tbec (Aspirin) .... Take One Tablet Daily 2)  Calcium-Vitamin D 250-125 Mg-Unit Tabs (Calcium Carbonate-Vitamin D) .... Take One Twice A Day 3)  Zocor 20 Mg Tabs (Simvastatin) .... Take One Tablet By Mouth Daily 4)  Xanax 0.5 Mg Tabs (Alprazolam) .... Take 1 Tablet By Mouth Two Times A Day As Needed 5)  Multivitamins  Caps (Multiple Vitamin) .... Take One By Mouth Daily 6)  Bl Flax Seed Oil 1000 Mg  Caps (Flaxseed (Linseed)) .... Take One Tablet By Mouth Three Times A Day 7)  Miralax   Powd (Polyethylene Glycol 3350) .... One Capful Once Daily As Needed Constipation 8)  Stool Softener 100 Mg Caps (Docusate Sodium) .Marland Kitchen.. 1 By Mouth Qlunchtime and 2 By Mouth At Bedtime 9)  Vitamin D3 1000 Unit Tabs (Cholecalciferol) .Marland Kitchen.. 1 By Mouth Once Daily 10)  Paroxetine Hcl 20 Mg Tabs (Paroxetine Hcl) .... Take 1 Tablet By Mouth Once A Day 11)  Trazodone Hcl 50 Mg Tabs (Trazodone Hcl) .... 1/2-1 Tab By Mouth Once A Day At Bedtime. 12)  Tramadol Hcl 50 Mg Tabs (Tramadol Hcl) .Marland Kitchen.. 1-2 Tabs By Mouth Up To Three Times A Day As Needed Pain 13)  Omeprazole 20 Mg Cpdr (Omeprazole) ....  Take One Capsule By Mouth Twice A Day 14)  Amitiza 8 Mcg Caps (Lubiprostone) .... 2 Tabs By Mouth Two Times A Day  Allergies (verified): 1)  ! Codeine Sulfate (Codeine Sulfate) 2)  ! Sulfa 3)  ! Prozac  Comments:  Nurse/Medical Assistant: The patient's medications and allergies were reviewed with the patient and were updated in the Medication and Allergy Lists. Avon Gully CMA, Duncan Dull) (March 05, 2010 3:18 PM)  Physical Exam  General:  Well-developed,well-nourished,in no acute distress; alert,appropriate and cooperative throughout examination Head:  Normocephalic and atraumatic without obvious abnormalities. No apparent alopecia or balding. Eyes:  No corneal or conjunctival inflammation noted. EOMI. Perrla. Ears:  External ear exam shows no significant lesions or deformities.  Otoscopic examination reveals clear canals, tympanic membranes are intact bilaterally without bulging, retraction, inflammation or discharge. Hearing is grossly normal bilaterally. Nose:  External nasal examination shows no deformity or inflammation. Nasal mucosa are pink and moist without lesions or exudates. Mouth:  Oral mucosa and oropharynx without lesions or exudates.  Teeth in good repair. Neck:  No deformities, masses, or tenderness noted. Lungs:  Normal respiratory effort, chest expands symmetrically. Lungs are clear to auscultation, no crackles or wheezes. Heart:  Normal rate and regular rhythm.  S1 and S2 normal without gallop, murmur, click, rub or other extra sounds. Skin:  no rashes.   Cervical Nodes:  No lymphadenopathy noted Psych:  Cognition and judgment appear intact. Alert and cooperative with normal attention span and concentration. No apparent delusions, illusions, hallucinations   Impression & Recommendations:  Problem # 1:  SINUSITIS - ACUTE-NOS (ICD-461.9) Lung exam is normal so this is reassuring.   Her updated medication list for this problem includes:    Amoxicillin 875 Mg Tabs  (Amoxicillin) .Marland Kitchen... Take 1 tablet by mouth two times a day for 10 days  Instructed on treatment. Call if symptoms persist or worsen.   Complete Medication List: 1)  Aspir-low 81 Mg Tbec (Aspirin) .... Take one tablet daily 2)  Calcium-vitamin D 250-125 Mg-unit Tabs (Calcium carbonate-vitamin d) .... Take one twice a day 3)  Zocor 20 Mg Tabs (Simvastatin) .... Take one tablet by mouth daily 4)  Xanax 0.5 Mg Tabs (Alprazolam) .... Take 1 tablet by mouth two times a day as needed 5)  Multivitamins Caps (Multiple vitamin) .... Take one by mouth daily 6)  Bl Flax Seed Oil 1000 Mg Caps (Flaxseed (linseed)) .... Take one tablet by mouth three times a day 7)  Miralax Powd (Polyethylene glycol 3350) .... One capful once daily as needed constipation 8)  Stool Softener 100 Mg Caps (Docusate sodium) .Marland Kitchen.. 1 by mouth qlunchtime and 2 by mouth at bedtime 9)  Vitamin D3 1000 Unit Tabs (Cholecalciferol) .Marland Kitchen.. 1 by mouth once daily 10)  Paroxetine Hcl 20 Mg Tabs (Paroxetine hcl) .... Take 1 tablet by mouth once a day 11)  Trazodone Hcl 50 Mg Tabs (Trazodone hcl) .... 1/2-1 tab by mouth once a day at bedtime. 12)  Tramadol Hcl 50 Mg Tabs (Tramadol hcl) .Marland Kitchen.. 1-2 tabs by mouth up to three times a day as needed pain 13)  Omeprazole 20 Mg Cpdr (Omeprazole) .... Take one capsule by mouth twice a day 14)  Amitiza 8 Mcg Caps (Lubiprostone) .... 2 tabs by mouth two times a day 15)  Amoxicillin 875 Mg Tabs (Amoxicillin) .... Take 1 tablet by mouth two times a day for 10 days  Patient Instructions: 1)  Call  if not better in 10 days.  2)  Stay hydrated.  Prescriptions: AMOXICILLIN 875 MG TABS (AMOXICILLIN) Take 1 tablet by mouth two times a day for 10 days  #20 x 0   Entered and Authorized by:   Nani Gasser MD   Signed by:   Nani Gasser MD on 03/05/2010   Method used:   Electronically to        [facility] [phone]* (retail)       [address] [place], [place]  [address]       Ph:  [phone]       Fax: [phone]   RxID:   [id number]    Orders Added: 1)  Est. Patient Level III [24401]

## 2010-03-15 ENCOUNTER — Telehealth: Payer: Self-pay | Admitting: Family Medicine

## 2010-03-17 NOTE — Procedures (Signed)
Summary: summary report  summary report   Imported By: Mirna Mires 03/09/2010 09:26:20  _____________________________________________________________________  External Attachment:    Type:   Image     Comment:   External Document

## 2010-03-18 ENCOUNTER — Telehealth: Payer: Self-pay | Admitting: Family Medicine

## 2010-03-25 NOTE — Progress Notes (Signed)
Summary: [facility]-Triage  Phone Note Call from Patient Call back at Home Phone [phone]   Caller: Patient Call For: Nani Gasser MD Reason for Call: Acute Illness Summary of Call: pt was seen on 1/27 and given amoxicillin for sinusitis.  Pt has completed abx and still has sinus pressure and headache, nasal drainage.  Also states her ears are stopped up.  Pt denies fever. Does pt need 2nd round of abx or office visit.  Please advise. Initial call taken by: Francee Piccolo CMA Duncan Dull),  March 18, 2010 4:14 PM  Follow-up for Phone Call        Will call in second round of antibiotic and if not better in one week needs appt.  Follow-up by: Nani Gasser MD,  March 18, 2010 4:22 PM  Additional Follow-up for Phone Call Additional follow up Details #1::        pt notified of above. Additional Follow-up by: Francee Piccolo CMA Duncan Dull),  March 18, 2010 4:45 PM    New/Updated Medications: ZITHROMAX Z-PAK 250 MG TABS (AZITHROMYCIN) Take as directed. Prescriptions: ZITHROMAX Z-PAK 250 MG TABS (AZITHROMYCIN) Take as directed.  #1 pack x 0   Entered and Authorized by:   Nani Gasser MD   Signed by:   Nani Gasser MD on 03/18/2010   Method used:   Electronically to        [facility] [phone]* (retail)       [address] [place], [place]  [address]       Ph: [phone]       Fax: [phone]   RxID:   [id number]

## 2010-03-25 NOTE — Progress Notes (Signed)
Summary: holter monitor results  Phone Note Call from Patient   Caller: Patient Call For: Nani Gasser MD Summary of Call: Pt called and wanted to know what the results of holter monitor. Initial call taken by: Avon Gully CMA, Duncan Dull),  March 15, 2010 3:52 PM  Follow-up for Phone Call        Occ early beats but no pauses or arrhythmia. These are not causing her symptoms.  Tell her sorry that her file was in scanning.  Follow-up by: Nani Gasser MD,  March 15, 2010 4:43 PM  Additional Follow-up for Phone Call Additional follow up Details #1::        pt notified Additional Follow-up by: Avon Gully CMA, Duncan Dull),  March 16, 2010 9:47 AM

## 2010-03-26 ENCOUNTER — Encounter: Payer: Self-pay | Admitting: Family Medicine

## 2010-04-27 NOTE — Consult Note (Signed)
Summary: [facility]  [facility]   Imported By: Maryln Gottron 04/20/2010 14:29:16  _____________________________________________________________________  External Attachment:    Type:   Image     Comment:   External Document

## 2010-05-20 LAB — CARDIAC PANEL(CRET KIN+CKTOT+MB+TROPI)
CK, MB: 2.4 ng/mL (ref 0.3–4.0)
Relative Index: INVALID (ref 0.0–2.5)
Total CK: 92 U/L (ref 7–177)
Troponin I: <0.01 ng/mL (ref 0.00–0.06)

## 2010-05-25 LAB — POCT CARDIAC MARKERS
CKMB, poc: 1.3 ng/mL (ref 1.0–8.0)
Myoglobin, poc: 98.6 ng/mL (ref 12–200)
Troponin i, poc: <0.05 ng/mL (ref 0.00–0.09)

## 2010-05-25 LAB — BASIC METABOLIC PANEL
BUN: 12 mg/dL (ref 6–23)
CO2: 26 mEq/L (ref 19–32)
Calcium: 10 mg/dL (ref 8.4–10.5)
Chloride: 105 mEq/L (ref 96–112)
Creatinine, Ser: 0.8 mg/dL (ref 0.4–1.2)
GFR calc Af Amer: >60 mL/min (ref >60)
GFR calc non Af Amer: >60 mL/min (ref >60)
Glucose, Bld: 95 mg/dL (ref 70–99)
Potassium: 4 mEq/L (ref 3.5–5.1)
Sodium: 139 mEq/L (ref 135–145)

## 2010-05-25 LAB — CBC
HCT: 42.9 % (ref 36.0–46.0)
Hemoglobin: 14.1 g/dL (ref 12.0–15.0)
MCHC: 32.9 g/dL (ref 30.0–36.0)
MCV: 85 fL (ref 78.0–100.0)
Platelets: 236 10*3/uL (ref 150–400)
RBC: 5.05 MIL/uL (ref 3.87–5.11)
RDW: 14.7 % (ref 11.5–15.5)
WBC: 8.7 10*3/uL (ref 4.0–10.5)

## 2010-05-25 LAB — URINALYSIS, ROUTINE W REFLEX MICROSCOPIC
Bilirubin Urine: NEGATIVE
Glucose, UA: NEGATIVE mg/dL
Hgb urine dipstick: NEGATIVE
Nitrite: NEGATIVE
Protein, ur: NEGATIVE mg/dL
Specific Gravity, Urine: 1.004 — ABNORMAL LOW (ref 1.005–1.030)
Urobilinogen, UA: 0.2 mg/dL (ref 0.0–1.0)
pH: 7 (ref 5.0–8.0)

## 2010-05-25 LAB — TSH: TSH: 3.9 u[IU]/mL (ref 0.350–4.500)

## 2010-05-25 LAB — DIFFERENTIAL
Basophils Absolute: 0 10*3/uL (ref 0.0–0.1)
Basophils Relative: 0 % (ref 0–1)
Eosinophils Absolute: 0.1 10*3/uL (ref 0.0–0.7)
Eosinophils Relative: 1 % (ref 0–5)
Lymphocytes Relative: 30 % (ref 12–46)
Lymphs Abs: 2.6 10*3/uL (ref 0.7–4.0)
Monocytes Absolute: 0.5 10*3/uL (ref 0.1–1.0)
Monocytes Relative: 6 % (ref 3–12)
Neutro Abs: 5.4 10*3/uL (ref 1.7–7.7)
Neutrophils Relative %: 62 % (ref 43–77)

## 2010-05-25 LAB — URINE MICROSCOPIC-ADD ON

## 2010-05-25 LAB — CARDIAC PANEL(CRET KIN+CKTOT+MB+TROPI)
CK, MB: 2.1 ng/mL (ref 0.3–4.0)
Relative Index: INVALID (ref 0.0–2.5)
Total CK: 86 U/L (ref 7–177)
Troponin I: <0.01 ng/mL (ref 0.00–0.06)

## 2010-05-25 LAB — HEMOGLOBIN A1C
Hgb A1c MFr Bld: 5.9 % (ref 4.6–6.1)
Mean Plasma Glucose: 123 mg/dL

## 2010-06-22 NOTE — H&P (Signed)
NAMEHANNA, [name]                 ACCOUNT NO.:  [id number]   MEDICAL RECORD NO.:  [id number]          PATIENT TYPE:  EMS   LOCATION:  ED                           FACILITY:  [facility]   PHYSICIAN:  Wayne A. Sheffield Slider, M.D.    DATE OF BIRTH:  1940-12-07   DATE OF ADMISSION:  04/06/2008  DATE OF DISCHARGE:  04/06/2008                              HISTORY & PHYSICAL   PRIMARY CARE PHYSICIAN:  Nani Gasser, MD   CHIEF COMPLAINT:  Chest pain.   HISTORY OF PRESENT ILLNESS:  This is a 70 year old female who was in  church this morning when she developed tightness in her chest that moved  up into her throat.  She states that she felt her throat was closing up.  At that time, she thinks she was perhaps hyperventilating and she did  feel slightly short of breath.  She also felt slightly dizzy at that  time.  She does also endorse some nausea at that time.  She waited until  the rest of church and then drove herself to the fire department that  was close by where she had her blood pressure checked.  It was very high  for her at that time at 145/107.  Upon this finding, she went over to  [facility] where she was evaluated.  Upon hearing  her story, Family Practice Teaching Service was called for admission,  and the patient was transferred to [facility].  The  chest pain resolved on its own while at [facility].  She was unsure what brought the chest pain on.  She has  never had any chest pain and discomfort like this prior.  She wonders if  it could be related to her chronic neck or back pain or if this could  perhaps have been a panic attack, but she knows of no trigger for a  panic attack at this time.  Only recent change that she notes are  increasing hot flashes for the last week which are unusual as she is  remotely menopausal and has not had hot flashes in quite some time.  She  has also noticed some worsening back and neck pain  that is responding  some to her TENS unit and some increasing urinary incontinence over the  last week or so.  Because of her back and neck pain, she has been  resting more over the past week and thinks this could have been part of  what she felt bad today at church.   CURRENT MEDICATIONS:  1. Aspirin 81 mg p.o. daily.  2. Calcium plus vitamin D p.o. b.i.d.  3. Zocor 20 mg p.o. daily.  4. Xanax 0.5 mg p.o. b.i.d.  5. Omeprazole 20 mg p.o. daily.  6. Multivitamins 1 p.o. daily.  7. Flunisolide 2 sprays each nostril daily.  8. Trazodone 50-100 mg p.o. nightly p.r.n. sleep.  9. Flaxseed oil 1000 mg p.o. t.i.d.  10.MiraLax 1 capsule p.r.n. constipation.  11.Colace 100 mg p.o. q. lunch time and 2 p.o. nightly.  12.Vitamin D3 1000 units  p.o. daily.  13.Claritin 10 mg p.o. daily  14.TENS unit as directed.   CURRENT ALLERGIES:  CODEINE, SULFA, and PROZAC.   PAST MEDICAL HISTORY:  1. History of bulging disks, spinal stenosis, and sciatica.  2. History of diverticuli of the colon, found incidentally on      colonoscopy.  3. 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  4. ABIs which were normal at a screen done on March 31, 2006.  5. Arthritis.  6. Depression and anxiety.  7. GERD.  8. Irritable bowel syndrome.  9. Uterine cancer status post hysterectomy.   PAST SURGICAL HISTORY:  1. Tonsillectomy as a child.  2. Appendectomy in the 1960s.  3. Hysterectomy in Jul 13, 1971 for uterine cancer.  4. EGD and colonoscopy which were normal other than a diverticuli as      noted above in July 12, 2000.   FAMILY HISTORY:  Father and brother had alcoholism and heart disease.  Mother had rheumatoid arthritis and kidney failure.  Father also had  psoriasis and lung cancer.  Son had lung cancer and HIV, and he was  deceased at the fall of 2007/07/13.  She also does have a family history of  colitis and Crohn's in her brother.   SOCIAL HISTORY:  She has 12  years of education and is currently  separated from her husband, Elijah Birk, who was incarcerated.  She has 4 adult  children of which 3 are living.  She quit smoking in 1993/07/12 and denies any  drugs or alcohol.  She does drink approximately 3 caffeinated drinks per  day.  She stretches daily and goes for walks regularly.  She is very  active in her church and used to work in the past as a Catering manager.   REVIEW OF SYSTEMS:  As per HPI, but otherwise is negative on greater  than 12 points.   PHYSICAL EXAMINATION:  VITAL SIGNS:  Temperature 98.0, pulse rate 88,  respirations 20 per minute, O2 sat 100% on room air, blood pressure  125/53.  GENERAL:  This is a well-developed, well-nourished white female in no  acute distress, who is alert, appropriate, and cooperative throughout  examination.  HEENT:  Normocephalic, atraumatic with no obvious abnormalities.  Pupils  are equal, round, and reactive to light with extraocular muscles intact  and no scleral inflammation or changes noted.  External ear exam is  without lesions or deformities.  External nares exam is without  deformities or inflammation.  Oropharynx is pink and moist without  lesions or exudates.  NECK:  No masses and supple without adenopathy.  LUNGS:  Clear to auscultation bilaterally with no crackles or wheezes  and normal respiratory effort.  CARDIOVASCULAR:  Regular rate and rhythm with normal S1 and S2.  No  murmurs, rubs, or gallops.  ABDOMEN:  Soft, nontender, and nondistended with positive bowel sounds.  No hepatosplenomegaly noted.  Musculoskeletal is symmetric without any  gross deformities and normal posture.  EXTREMITIES:  No cyanosis, clubbing, or edema.  NEUROLOGIC:  The patient is alert, oriented x3, and grossly normal  neurologically.  Strength equal in all extremities.  SKIN:  No rashes.  PSYCHIATRIC:  No acute abnormalities.   LABORATORY DATA:  Lipid panel in November 2009 revealed cholesterol 157,  triglycerides  147, HDL 42, LDL 86, VLDL 29.  Labs from the emergency  room today reveal white blood count 8.7, hemoglobin 14.1, hematocrit  42.9, platelet count 236 with  a normal differential.  Urinalysis reveals  specific gravity 1.007, pH 7, trace ketones, trace leuk esterase, 0-2  white blood cells, and otherwise within normal limits.  Point-of-care  cardiac enzymes revealed CK-MB 1.3, troponin I less than 0.05, myoglobin  98.6.  Basic metabolic panel reveals sodium 409, potassium 4, chloride  105, bicarb 26, BUN 12, creatinine 0.8, glucose 95 with calcium 10.0.   STUDIES:  Chest x-ray reveals no acute findings and stable calcified  granulomas of the left lung base.  EKG reveals normal sinus rhythm with  no acute findings.   ASSESSMENT:  This is a 70 year old female with chest pain and history of  hyperlipidemia, irritable bowel syndrome, constipation, vomiting,  arthritis, gastroesophageal reflux disease, and anxiety.   PLAN:  1. Chest pain.  We will cycle her cardiac enzymes x3 sets and watch      her closely on telemetry.  We will follow a repeat EKG in the a.m.      We will continue aspirin 81 mg daily and morphine and nitroglycerin      as needed.  In terms of risk stratification, the patient has had a      recent lipid panel that was excellent.  Therefore, we will continue      her Zocor and her flaxseed oil per her outpatient doses.  She has a      history of glucose intolerance, therefore we will check an A1c.  We      will monitor blood pressures closely as she has no history of      hypertension.  She has had a normal stress test many years ago and      likely will benefit from a repeat as an outpatient if she rules out      during this hospitalization.  Certainly if she rules in, we will      contact the cardiologist as an inpatient.  The chest x-ray was      reassuring for no acute pulmonary cause for her chest pain.  We      will continue to treat her reflux, but use Protonix in half  as this      is on formulary.  We will continue treat her anxiety with her home      medications and monitor her closely.  2. Hyperlipidemia, as above.  We will continue her home medications      and we then need to repeat her lipid panel at this time.  3. IBS, constipation.  We will continue her home MiraLax and Colace.  4. Arthritis.  We will provide Tylenol as needed.  5. GERD.  As above, we will have the patient on Protonix.  6. Anxiety and depression.  We will continue her home meds of Xanax      and trazodone, and monitor closely.  7. Seasonal allergies.  We will continue her on nasal spray and      Claritin.  8. Primary and secondary prevention.  We will continue her home      calcium, vitamin D, multivitamin, and vitamin D3.  We will place      the patient on heparin 5000 units subcu while in-house for DVT      prophylaxis.  9. FEN.  The patient's lytes are currently stable.  We will provide      the patient with a low-sodium, heart-healthy diet while here.      There was no indication for IV fluids at this time.  10.Disposition.  Likely she will benefit as noted above from an      outpatient repeat of her stress test.  Further, she was seen by Dr.      Charlton Haws in the past.       Ancil Boozer, MD  Electronically Signed      Deniece Portela A. Sheffield Slider, M.D.  Electronically Signed    SA/MEDQ  D:  04/08/2008  T:  04/08/2008  Job:  [id number]

## 2010-06-22 NOTE — Group Therapy Note (Signed)
REASON FOR REFERRAL:  Chronic back and leg pain.   HISTORY:  [name] who had insidious onset pain in the low  back as well as radiating to the left lower extremity with electrical  sensations below the knee on the left side, this was in 2006.  She did  have an MRI which I do not have a copy of.  She states that her  electrical sensations below the knee subsided, but more recently has  been having increased back pain radiating towards the right buttock and  hip, as well as pain down both legs.  She has been seeing a  [facility].  She has had some therapy in the past.  She is interested  in trying some injections for her pain as well.   Her average pain is 7-8/10, described as tingling in the legs and aching  in the back.  Sleep is fair.  Pain interferes with activity at 7/10  level.  Pain is worse with bending and standing, improves with rest,  heat, medication, and TENS.  Relief from meds is fair, around 6/10.  She  can walk 30 minutes at time.  She climbs steps, but slowly.  She drives.  She is independent with all her self-care mobility, does her housework.  She has been retired since 2002.   REVIEW OF SYSTEMS:  Positive for bladder control problems, numbness,  tingling, spasms and constipation, but none of this is new.   MEDICATIONS:  In terms of pain medication, she takes propoxyphene about  2 tablets per day, has tried tramadol in the past, this was not  particularly helpful.  Hydrocodone in the past was helpful for another  condition when she had a shoulder surgery by Dr. Lajoyce Corners.   PAST SURGICAL HISTORY:  Positive for hysterectomy for uterine cancer in  1973.   SOCIAL HISTORY:  Divorced.   FAMILY HISTORY:  Heart disease.   Oswestry disability score 44%.   Her blood pressure 106/66, weight 193 pounds, pulses 83.   Overweight female in no acute distress, orientation x3.  Affect is  bright and alert.  Gait is normal.   Extremities without edema.  Coordination  normal in the upper and lower  extremity.  Deep tendon reflexes are normal in the upper and lower  extremities with the exception of left ankle which is 1+ compared to 2+  in the right side.  Her motor strength is 5/5 bilateral deltoid, biceps,  triceps, grip as well as hip flexion, knee extension, ankle  dorsiflexion, and great toe extension.  Her upper extremity and lower  extremity range of motion is full.  Her back range of motion reduced in  extension, she gets to about neutral.  Forward flexion, she gets to 50%  range.  Neck range of motion is mildly diminished i.e. 25% or less going  towards the left side compared to the right side.  She has negative  Tinel's, negative Phalen's over the wrists.  Spurling test is negative.   IMAGING STUDIES:  I have reviewed the patient's recent MRI dated on  June 02, 2008, lumbosacral with the patient using a spine model to  explain the findings.  Her most predominant finding is at L5-S1  posterior lateral disk which is fairly small, but combined with a  moderate ligamentum flavum, facet hypertrophy contributes to impingement  upon the left L5 descending nerve root.  In addition, she has fairly  significant L4-5 and L5-S1 facet hypertrophy more so at L4-5.  IMPRESSION:  1. Lumbar pain.  This most likely be a facet mediated given that she      has pain with extension.  We will further evaluate with medial      branch blocks on the right side.  2. Left lower extremity pain related to L5 nerve root impingement in      the lateral recess area from the L4-5 disk and multifactorial canal      stenosis.  3. Right lower extremity numbness, tingling and pain, which would not      be explained by MRI findings.  She may have peroneal neuropathy      versus other type of lower extremity entrapment neuropathy, less      likely to have something like a plexopathy.  4. Bilateral hand numbness and tingling and pain mainly at night.  I      suspect this  carpal tunnel provocative testing is negative.  She      does get some relief from splinting.  This is not a major concern      at this time.  5. Neck pain.  She does have limitation range of motion.  I would      suspect that she has cervical spondylosis.  She states that it is      really not that bad right now, but about a month ago was quite      painful.   PLAN:  We will do the medial branch blocks.  We will likely need to  follow this up with L5 selective nerve root block.  Should her neck  become more problematic, may need to image the neck and certainly if her  hands become more numb or she starts having some motor weakness, we will  need to check EMG.   Thank you for this referral, keep you apprised of her progress.      Erick Colace, M.D.  Electronically Signed     AEK/MedQ  D:  06/06/2008 10:48:09  T:  06/07/2008 01:22:11  Job #:  [id number]

## 2010-06-22 NOTE — Discharge Summary (Signed)
Cheryl [name], Cheryl [name]                 ACCOUNT NO.:  [id number]   MEDICAL RECORD NO.:  [id number]          PATIENT TYPE:  OBV   LOCATION:  3711                         FACILITY:  [facility]   PHYSICIAN:  Paula Compton, MD        DATE OF BIRTH:  October 08, 1940   DATE OF ADMISSION:  04/06/2008  DATE OF DISCHARGE:  04/07/2008                               DISCHARGE SUMMARY   PRIMARY CARE PHYSICIAN:  Nani Gasser, MD of [place] in  [place].   DISCHARGE DIAGNOSES:  1. Chest pain.  2. Hyperlipidemia.  3. Irritable bowel/constipation.  4. Arthritis.  5. Gastroesophageal reflux disease.  6. Anxiety and depression.  7. Seasonal allergies.   DISCHARGE MEDICATIONS:  1. Aspirin 81 mg p.o. daily.  2. Calcium/vitamin D 250/125 mg/units 1 tablet twice daily.  3. Zocor 20 mg p.o. daily.  4. Xanax 0.5 mg 1 tablet p.o. b.i.d. p.r.n. anxiety.  5. Omeprazole 20 mg p.o. daily before breakfast.  6. Multivitamin take 1 tablet by mouth daily.  7. Flunisolide 29 mcg solution 2 sprays per nostril daily.  8. Trazodone 50 mg 1-2 tablets p.o. nightly as needed for sleep.  9. Flaxseed oil 1000 mg 1 tablet by mouth t.i.d.  10.MiraLax powder 1 capful once daily as needed.  11.Docusate 100 mg 1 capsule at lunch and 2 at bedtime p.o. daily.  12.Vitamin D3 1000 units 1 tablet p.o. daily.  13.Claritin 10 mg 1 tablet by mouth once daily.   CONSULTATIONS:  There were no consults during this hospitalization.   The patient had the following procedures:  1. She had a chest x-ray done on April 06, 2008, that showed no      acute cardiopulmonary findings.  2. She also had an EKG that showed normal sinus rhythm.  It was deemed      a normal EKG.   PERTINENT LABORATORY DATA:  Admission labs on April 06, 2008, of a  CBC with differential that was completely normal.  Her white blood cell  count was 8.7, hemoglobin 14.1, and platelets 236.  She had a urinalysis  that was done on April 06, 2008, that was normal  except for a  specific gravity that was 1.004, trace ketones, and trace leukocytes.  She also had urine microscopic done on that same sample that showed  white blood cells of 0-2, which were normal.  She had point-of-care  cardiac enzymes that were negative on April 06, 2008.  She also had a  basic metabolic panel that was completely normal on April 06, 2008.  She had 2 more subsequent sets of cardiac enzymes that were completely  normal as well.   BRIEF HOSPITAL COURSE:  1. This patient presented on April 06, 2008, after experiencing      some chest tightness that morning that developed during church.      She said she felt like if she had a chest tightness and that moved      up into her throat and felt like her throat was sort of closing      off.  She thinks that  she was perhaps hyperventilating.  She did      feel slightly dizzy at that time and some nausea at that time as      well.  She was able to stay through the entire service and then      drove herself to the fire department, which was close to the church      were she was found to have a blood pressure that was 145/107.  This      is extremely high for her, and she said she did feel very strange.      At that time, the patient was taken to [facility] Emergency      Department and evaluated.  She was treated until she was      transferred over [facility]  By the time she got to our hospital,      her chest pain had resolved, and overnight she had no further chest      pain.  She did have 3 sets of negative cardiac enzymes.  On the      morning of discharge, the patient was able to endorse tracing her      back pain and possibly chest pain as well to when she started doing      work in her yard last week.  She does have chronic back pain for      which she uses heat, ice, and a TENS unit.  She does have anxiety      as well, for which she uses Xanax as a p.r.n. medication.  Upon      discharge, the patient is  stable and has had no more chest pain;      however, she does mention that her back is hurting, worse after      sleeping on the bed here in the hospital.  She is anxious to go      home.  2. Other issues addressed during this hospitalization is      hyperlipidemia.  The patient was continued on her home medication      of Zocor.  She had recently had a lipid panel, and it was not      repeated at this time.  It was completely normal at the time of the      original lipid panel.  3. Irritable bowel syndrome with constipation, predominant:  The      patient uses MiraLax and Colace at home.  The patient was continued      on these medications during her hospitalization.  4. Arthritis:  The patient is treated for arthritis, as well as back      pain.  She uses Tylenol as needed.  The patient continued to have      arthritic and back pain during her hospitalization and was treated      with Tylenol.  5. Gastroesophageal reflux disease:  The patient was treated with      Protonix while she was in the hospital.  She will be sent home on      omeprazole 20 mg p.o. before breakfast.  6. Anxiety and depression:  The patient has a history of anxiety and      depression.  She will be continued on her home meds upon discharge.      Her home meds are Xanax and trazodone.  It is likely that this      incident was due to a panic attack, and there were no cardiac  indication found during this hospitalization; however, it is      recommended that the patient have a followup cardiac stress test.      She has had 1 stress test done before by Dr. Eden Emms and it would      likely benefit the patient to calm her anxiety as she is anxious      about this being related to her heart.  7. Seasonal allergies:  The patient was continued with Flonase and      Claritin while she was in the hospital.  She will be sent home also      with Flonase and Claritin.  Primary and secondary prevention      issues, the  patient is continued on her home medication of calcium      and vitamin D, multivitamin and vitamin D supplementation during      her hospitalization.  She will be sent home with these medications      as well.   Discharge instructions include following up with her PCP, Dr. Nani Gasser, on April 11, 2008, at 8:30 a.m.  The patient will also need to  emphasize a low-sodium heart-healthy diet.  Also if the patient  experiences any chest pain, shortness of breath, or worsening symptoms,  the patient is encouraged to come back to the ED or to call her primary  care physician and get see immediately.   Followup appointments are like mentioned above on April 11, 2008, at 8:30  a.m. with her PCP, Dr. Linford Arnold, the phone number there is [phone].  The patient was discharged home in stable medical condition.      Jamie Brookes, MD  Electronically Signed      Paula Compton, MD  Electronically Signed    AS/MEDQ  D:  04/07/2008  T:  04/08/2008  Job:  [id number]   cc:   Nani Gasser, M.D.

## 2010-06-24 ENCOUNTER — Ambulatory Visit: Payer: Self-pay | Admitting: Internal Medicine

## 2010-06-25 NOTE — Group Therapy Note (Signed)
NAME:  Cheryl [name], Cheryl [name]                           ACCOUNT NO.:  [id number]   MEDICAL RECORD NO.:  [id number]                   PATIENT TYPE:  OUT   LOCATION:  [facility]                           FACILITY:  [facility]   PHYSICIAN:  Tinnie Gens, MD                     DATE OF BIRTH:  Sep 07, 1940   DATE OF SERVICE:  12/06/2002                                    CLINIC NOTE   CHIEF COMPLAINT:  Prolapse.   HISTORY OF PRESENT ILLNESS:  The patient is a 70 year old gravida 2, para 1  who is status post hysterectomy for a cervical cancer in 1973.  She stated  that she has some prolapse when she strains in her vagina and she thought  her bladder was falling.  She actually went to see an internal medicine  physician who referred her here.  She reports that she is not having any  symptoms of incontinence.  She does have some urgency and a little bit of  stress incontinence but very little.  She also has IBS with constipation and  is constipated frequently.   PAST MEDICAL HISTORY:  Significant for arthritis, elevated cholesterol, and  cervical cancer.   PAST SURGICAL HISTORY:  She had a hysterectomy in 1973 and appendectomy in  1964.   ALLERGIES:  She is ALLERGIC to CODEINE and SULFA.   CURRENT MEDICATIONS:  Include Premarin, Prevacid, and Flonase.   OBSTETRICAL HISTORY:  She is a G 2, P 1.   FAMILY HISTORY:  She has lung cancer in her father.   SOCIAL HISTORY:  She denies tobacco, alcohol, or drug use.   She does have hot flashes, vaginal itching.  Her vision is getting worse.  She also has some swelling of her legs and fatigue.  Otherwise a 14-point  review of systems is completely negative.   PHYSICAL EXAMINATION:  VITAL SIGNS:  Weight is 189.9, height 5 foot 4  inches, blood pressure is 116/72, pulse is 81.  GENERAL:  She is a moderately obese white female in no acute distress.  LUNGS:  Clear bilaterally.  CARDIOVASCULAR:  Regular rate and rhythm.  ABDOMEN:  Soft, nontender,  nondistended.  GENITOURINARY:  Atrophic external genitalia.  Atrophic vagina.  The speculum  was taken apart and the inferior portion of the vagina is examined.  There  is no cervix.  The cervix and uterus are surgically absent.  There is no  cystocele, even with bearing down there is no change in the position of the  urethra.  Posteriorly there is a large rectocele on rectovaginal exam.  There is a large defect between the rectum and the vagina, and the posterior  vagina was approximately 4 to 5 cm in length.  There is hard brown stool in  the vault.   IMPRESSION:  Rectocele.   PLAN:  Discussed this at length with the patient, its implications, its  treatment options,  feasibility of success, and at this point since it is not  really bothering the patient she elects for expectant management.                                               Tinnie Gens, MD    TP/MEDQ  D:  12/06/2002  T:  12/06/2002  Job:  [id number]   cc:   Internal Medicine

## 2010-07-13 ENCOUNTER — Other Ambulatory Visit: Payer: Self-pay | Admitting: Neurology

## 2010-07-14 ENCOUNTER — Other Ambulatory Visit: Payer: Self-pay | Admitting: Neurology

## 2010-07-14 DIAGNOSIS — R51 Headache: Secondary | ICD-10-CM

## 2010-07-14 DIAGNOSIS — M542 Cervicalgia: Secondary | ICD-10-CM

## 2010-07-19 ENCOUNTER — Ambulatory Visit
Admission: RE | Admit: 2010-07-19 | Discharge: 2010-07-19 | Disposition: A | Discharge status: Home / Self Care | Payer: Medicare Other | Source: Ambulatory Visit | Attending: Neurology | Admitting: Neurology

## 2010-07-19 ENCOUNTER — Other Ambulatory Visit: Payer: Self-pay | Admitting: Neurology

## 2010-07-19 DIAGNOSIS — M542 Cervicalgia: Secondary | ICD-10-CM

## 2010-07-19 DIAGNOSIS — R51 Headache: Secondary | ICD-10-CM

## 2010-07-26 ENCOUNTER — Other Ambulatory Visit: Payer: Self-pay | Admitting: Family Medicine

## 2010-07-26 DIAGNOSIS — Z72820 Sleep deprivation: Secondary | ICD-10-CM

## 2010-07-26 MED ORDER — TRAZODONE HCL 50 MG PO TABS: 50.0000 mg | ORAL_TABLET | Freq: Every day | ORAL | Status: DC

## 2010-07-26 NOTE — Telephone Encounter (Signed)
Pt called and said she had been trying to get refill on her trazadone for two weeks, and we haven't responded back.  Please let know.   Plan:  Reviewed the chart and pt had a medicare well visit on 11-27-09, and will send the prescription refill to her pharm.  Pt informed that prescription refill sent to [facility] per her request. Jarvis Newcomer, LPN [facility]

## 2010-08-04 ENCOUNTER — Encounter: Payer: Self-pay | Admitting: Internal Medicine

## 2010-08-13 ENCOUNTER — Ambulatory Visit
Admission: RE | Admit: 2010-08-13 | Discharge: 2010-08-13 | Disposition: A | Discharge status: Home / Self Care | Payer: Medicare Other | Source: Ambulatory Visit | Attending: Family Medicine | Admitting: Family Medicine

## 2010-08-13 ENCOUNTER — Encounter: Payer: Self-pay | Admitting: Family Medicine

## 2010-08-13 ENCOUNTER — Telehealth: Payer: Self-pay | Admitting: Family Medicine

## 2010-08-13 ENCOUNTER — Ambulatory Visit (INDEPENDENT_AMBULATORY_CARE_PROVIDER_SITE_OTHER): Payer: Medicare Other | Admitting: Family Medicine

## 2010-08-13 DIAGNOSIS — K5900 Constipation, unspecified: Secondary | ICD-10-CM

## 2010-08-13 NOTE — Progress Notes (Signed)
  Subjective:    Patient ID: Cheryl [name], female    DOB: Sep 03, 1940, 70 y.o.   MRN: [id number]  HPI  70 yo WF presents for constipation.  She has been on Amitiza which she is still taking.  She has a long hx of IBS- constipation.  She had surgery for rectal prolapse but that didn't seem to help.  She is having to strain.  She is on a stool softener daily.  She has not used a laxative or an enema.  She has not had a BM in about 8 days.  She does not feel the urge to go.  Denies abd pain but she feels bloated.  She is not passing much gas.  She is not on any pain meds asside from Tramadol.  Her last colonoscopy was 10 yrs ago and she is scheduled to go back in Aug to see Dr Marina Goodell.  BP 108/57  Pulse 76  Ht 5' 4.5" (1.638 m)  Wt 181 lb (82.101 kg)  BMI 30.59 kg/m2  SpO2 99%  Review of Systems  Constitutional: Negative for fever, chills and fatigue.  Gastrointestinal: Positive for constipation and abdominal distention. Negative for nausea, diarrhea and blood in stool.       Objective:   Physical Exam  Constitutional: She appears well-developed and well-nourished.  HENT:  Mouth/Throat: Oropharynx is clear and moist.  Eyes: No scleral icterus.  Neck: Neck supple. No thyromegaly present.  Cardiovascular: Normal rate, regular rhythm and normal heart sounds.   Pulmonary/Chest: Effort normal and breath sounds normal.  Abdominal: Soft. She exhibits distension. She exhibits no mass. There is tenderness (periubmilcal tenderness). There is no guarding.  Genitourinary: Rectum normal. Rectal exam shows no external hemorrhoid, no internal hemorrhoid, no fissure, no mass and no tenderness. Guaiac negative stool.  Musculoskeletal: She exhibits no edema.  Skin: Skin is warm and dry.  Psychiatric: She has a normal mood and affect.          Assessment & Plan:

## 2010-08-13 NOTE — Patient Instructions (Signed)
Hold Amitiza for 3 days, using Miralax 2 x a day.  Colace 2 x a day.  Xray abdomen today. Drink plenty of fluids, water, prune juice, etc.  Call if not improved on Monday.

## 2010-08-13 NOTE — Telephone Encounter (Signed)
Pt aware of the above  

## 2010-08-13 NOTE — Assessment & Plan Note (Addendum)
Acute on chronic constipation w/o known cause even on bowel regimen.  2 view abd xray is normal.  Rectal exam is normal w/o stool in the rectal vault.  Hold amitiza.  Add senna or Senokot this weekend.  Stay on stool softener.  Drink plenty of water, prune juice and call Monday if still no BM.

## 2010-08-13 NOTE — Telephone Encounter (Signed)
Pls let pt know that her abdominal xray is + for constipation but neg for bowel obstruction.  Her cell # is [phone].  Continue current treatment plan.

## 2010-08-21 ENCOUNTER — Other Ambulatory Visit: Payer: Self-pay | Admitting: Family Medicine

## 2010-08-24 ENCOUNTER — Other Ambulatory Visit: Payer: Self-pay | Admitting: Family Medicine

## 2010-08-24 NOTE — Telephone Encounter (Signed)
Pt called and needs a refill for her omeprazole 20 mg medication.  Said the pharm has sent request and we have not responded.  This is the pt 2nd request. Plan:  Reviewed the chart and pt script was sent on 08-21-10 #50/2 refills and pt was informed and she will call her pharm back, and if they don;t have it she will have them call triage nurse at our office. Jarvis Newcomer, LPN [facility]

## 2010-09-09 ENCOUNTER — Ambulatory Visit (AMBULATORY_SURGERY_CENTER): Payer: Medicare Other | Admitting: *Deleted

## 2010-09-09 ENCOUNTER — Encounter: Payer: Self-pay | Admitting: Internal Medicine

## 2010-09-09 ENCOUNTER — Telehealth: Payer: Self-pay | Admitting: *Deleted

## 2010-09-09 VITALS — Ht 64.5 in | Wt 180.9 lb

## 2010-09-09 DIAGNOSIS — Z1211 Encounter for screening for malignant neoplasm of colon: Secondary | ICD-10-CM

## 2010-09-09 MED ORDER — PEG-KCL-NACL-NASULF-NA ASC-C 100 G PO SOLR: ORAL | Status: DC

## 2010-09-09 NOTE — Telephone Encounter (Signed)
Talked with pt and instructed her to drink one bottle of Magnesium Citrate the morning before the procedure.  Pt verbalized understanding. Cheryl [name]

## 2010-09-09 NOTE — Telephone Encounter (Signed)
She can take one bottle of mag citrate the morning before the procedure (before she starts the movi prep later that day)

## 2010-09-09 NOTE — Telephone Encounter (Signed)
Dr. Marina Goodell- pt is scheduled for colonoscopy 10/07/2010.  She has chronic constipation and is concerned that she may not be cleaned out for procedure.  Do you want her to have additional prep along with MoviPrep? Ezra Sites

## 2010-09-23 ENCOUNTER — Other Ambulatory Visit: Payer: Medicare Other | Admitting: Internal Medicine

## 2010-10-05 ENCOUNTER — Other Ambulatory Visit: Payer: Self-pay | Admitting: Family Medicine

## 2010-10-05 ENCOUNTER — Ambulatory Visit (INDEPENDENT_AMBULATORY_CARE_PROVIDER_SITE_OTHER): Payer: Medicare Other | Admitting: Family Medicine

## 2010-10-05 ENCOUNTER — Telehealth: Payer: Self-pay | Admitting: Family Medicine

## 2010-10-05 ENCOUNTER — Encounter: Payer: Self-pay | Admitting: Family Medicine

## 2010-10-05 DIAGNOSIS — J40 Bronchitis, not specified as acute or chronic: Secondary | ICD-10-CM

## 2010-10-05 MED ORDER — HYDROCODONE-HOMATROPINE 5-1.5 MG/5ML PO SYRP: 5.0000 mL | ORAL_SOLUTION | Freq: Three times a day (TID) | ORAL | Status: DC | PRN

## 2010-10-05 MED ORDER — DOXYCYCLINE HYCLATE 100 MG PO CAPS: 100.0000 mg | ORAL_CAPSULE | Freq: Two times a day (BID) | ORAL | Status: AC

## 2010-10-05 NOTE — Telephone Encounter (Signed)
Pt called and said she is experiencing ST since Sunday, chest congestion since Monday. +productive cough with yellow mucus.  Knows she has sinus infection.  There are no available appts today.  Pt has colonoscopy scheduled for tomorrow and needs to come today. PLAN:  Routed to Dr. Linford Arnold.  Will verify if can double book today at 1:00pm or send to UC. Jarvis Newcomer, LPN [facility]

## 2010-10-05 NOTE — Patient Instructions (Signed)
Call me if not better

## 2010-10-05 NOTE — Progress Notes (Signed)
  Subjective:    Patient ID: Cheryl [name], female    DOB: [birth year], 70 y.o.   MRN: [id number]  HPI Sunday Am with ST and felt worse at the day progessed. Felt achy. Feels slight fever today.  Today took some tyleol. Feels it is going into her chest.  Painful cough in her chest.  Small about of sputum. Some mild sinus congstion and + postnasal drip.  No GI sxs.  + sick contacts.    Review of Systems     Objective:   Physical Exam  Constitutional: She is oriented to person, place, and time. She appears well-developed and well-nourished.  HENT:  Head: Normocephalic and atraumatic.  Right Ear: External ear normal.  Left Ear: External ear normal.  Nose: Nose normal.  Mouth/Throat: Oropharynx is clear and moist.       TMs and canals are clear.   Eyes: Conjunctivae and EOM are normal. Pupils are equal, round, and reactive to light.  Neck: Neck supple. No thyromegaly present.  Cardiovascular: Normal rate, regular rhythm and normal heart sounds.   Pulmonary/Chest: Effort normal and breath sounds normal. She has no wheezes.  Lymphadenopathy:    She has no cervical adenopathy.  Neurological: She is alert and oriented to person, place, and time.  Skin: Skin is warm and dry.  Psychiatric: She has a normal mood and affect.          Assessment & Plan:  Broncitis - Likley viral but since supposed to have her colonoscopy in 2 days will tx. Call if not better in one week. Cough med sent to pharm. Clal if gets SOB.

## 2010-10-05 NOTE — Telephone Encounter (Signed)
[facility] called and said pt was there and she was seen pur office today, but they only rec doxy Rx and not the cough medication. Plan:  Gave V.O for the hycodan syrup since it went at the same time as the doxy, but not received by the pharm.  When the pharmacist actually double checked it went across the fax que and they found the script while I was on the phone waiting to give the pharmacist a verbal.  Jarvis Newcomer, LPN [facility]

## 2010-10-07 ENCOUNTER — Encounter: Payer: Self-pay | Admitting: Internal Medicine

## 2010-10-07 ENCOUNTER — Ambulatory Visit (AMBULATORY_SURGERY_CENTER): Payer: Medicare Other | Admitting: Internal Medicine

## 2010-10-07 VITALS — BP 115/59 | HR 80 | Temp 98.5°F | Resp 14 | Ht 65.0 in | Wt 175.0 lb

## 2010-10-07 DIAGNOSIS — D126 Benign neoplasm of colon, unspecified: Secondary | ICD-10-CM

## 2010-10-07 DIAGNOSIS — K5730 Diverticulosis of large intestine without perforation or abscess without bleeding: Secondary | ICD-10-CM

## 2010-10-07 DIAGNOSIS — Z1211 Encounter for screening for malignant neoplasm of colon: Secondary | ICD-10-CM

## 2010-10-07 MED ORDER — SODIUM CHLORIDE 0.9 % IV SOLN: 500.0000 mL | INTRAVENOUS | Status: DC

## 2010-10-07 NOTE — Progress Notes (Signed)
PT HAS BRONCHITIS. CONGESTION, CONGESTED COUGH, BUT NO FEVER. SAW DR Linford Arnold ON Tuesday AND SHE GAVE HER AN ANTIBIOTIC BUT PT HASNT STARTED HER MEDICINE AS OF YET DUE TO HER PROCEDURE. PLANS TO TAKE IT TODAY. EWM

## 2010-10-07 NOTE — Patient Instructions (Signed)
Please review discharge instructions (blue and green sheets)  Please review information on polyps, diverticulosis, hemorrhoids, and high fiber diets

## 2010-10-08 ENCOUNTER — Telehealth: Payer: Self-pay | Admitting: *Deleted

## 2010-10-08 NOTE — Telephone Encounter (Signed)

## 2010-10-14 ENCOUNTER — Other Ambulatory Visit: Payer: Self-pay | Admitting: Family Medicine

## 2010-11-09 ENCOUNTER — Other Ambulatory Visit: Payer: Self-pay | Admitting: Family Medicine

## 2010-11-09 DIAGNOSIS — Z1231 Encounter for screening mammogram for malignant neoplasm of breast: Secondary | ICD-10-CM

## 2010-11-18 ENCOUNTER — Other Ambulatory Visit: Payer: Self-pay | Admitting: Family Medicine

## 2010-11-25 ENCOUNTER — Telehealth: Payer: Self-pay | Admitting: Family Medicine

## 2010-11-25 MED ORDER — OMEPRAZOLE 20 MG PO CPDR: 20.0000 mg | DELAYED_RELEASE_CAPSULE | Freq: Two times a day (BID) | ORAL | Status: DC

## 2010-11-25 NOTE — Telephone Encounter (Signed)
Pt called and said a script was sent for her omeprazole for only 30 tabs for the month but she takes the med twice daily.  Wrong amt was sent for this prescription. Plan:  Corrected the script to read as omeprazole twice daily.  Sent electronically the correction.  Pt informed. Jarvis Newcomer, LPN [facility]

## 2010-12-01 ENCOUNTER — Telehealth: Payer: Self-pay | Admitting: *Deleted

## 2010-12-01 DIAGNOSIS — E785 Hyperlipidemia, unspecified: Secondary | ICD-10-CM

## 2010-12-02 ENCOUNTER — Telehealth: Payer: Self-pay | Admitting: Internal Medicine

## 2010-12-02 NOTE — Telephone Encounter (Signed)
Pt last OV with Dr. Marina Goodell 01/17/08 for reflux/IBS. Last colon done 10/07/10 by Dr. Marina Goodell. Pt states she was seen in the ER at [facility] for pancreatitis. She has been scheduled for an EUS with a GI doc at [facility] but would like to have that done here is possible. States she was told her pancreas needs to rest for about 6 weeks before she has the EUS. Let pt know that Dr. Christella Hartigan does those procedures for [facility]. Pt requests to have EUS by Dr. Christella Hartigan if possible. Dr. Marina Goodell please advise.

## 2010-12-02 NOTE — Telephone Encounter (Signed)
It would be best if I see her in the office to review everything first. We can go from there in terms of setting up an EUS. I will need ALL records relevant to this problem with pancreatitis

## 2010-12-02 NOTE — Telephone Encounter (Signed)
Pt scheduled to see Dr. Marina Goodell 12/23/10@2 :30pm. Pt aware of appt date and time. Pt to come and sign release form to obtain her medical records for Dr. Marina Goodell.

## 2010-12-03 ENCOUNTER — Telehealth: Payer: Self-pay | Admitting: Internal Medicine

## 2010-12-03 ENCOUNTER — Telehealth: Payer: Self-pay | Admitting: Family Medicine

## 2010-12-03 NOTE — Telephone Encounter (Signed)
Received 19pgs from [facility]. Forwarded to Dr. Marina Goodell for review. 12/03/10-ar

## 2010-12-03 NOTE — Telephone Encounter (Signed)
Pt called and inquired about her lab work that is to be drawn at [facility].   PLan :  Pt informed the lipid panel ordered and since she just discharged from the hosp yest and diagnosed with pancreatitis she was wondering if she could hold off on going for labs today.  Pt has upcoming appt in our office and she was told she could wait and go when she felt more up to it.  Pt inquired about HgbA1c and if ordered.  Told pt no but we do that test in office the same day that she will be seen. Jarvis Newcomer, LPN [facility]

## 2010-12-06 ENCOUNTER — Encounter: Payer: Self-pay | Admitting: Internal Medicine

## 2010-12-07 ENCOUNTER — Telehealth: Payer: Self-pay | Admitting: Family Medicine

## 2010-12-07 DIAGNOSIS — K859 Acute pancreatitis without necrosis or infection, unspecified: Secondary | ICD-10-CM

## 2010-12-07 NOTE — Telephone Encounter (Signed)
She was evidently admitted on 10/22 for pancreatitis. Please call her and see how she's doing.

## 2010-12-08 NOTE — Telephone Encounter (Signed)
Pt is doing OK. She is going for lipid panel in the AM and would like you to order additional labs for her A1C and labs that would f/u on pancreatitis. Please order and I will fax.

## 2010-12-08 NOTE — Telephone Encounter (Signed)
Lab slip printed with additional lab work. Okay to fax down.

## 2010-12-10 DIAGNOSIS — J8410 Pulmonary fibrosis, unspecified: Secondary | ICD-10-CM | POA: Insufficient documentation

## 2010-12-10 DIAGNOSIS — R911 Solitary pulmonary nodule: Secondary | ICD-10-CM | POA: Insufficient documentation

## 2010-12-10 LAB — LIPID PANEL
Cholesterol: 183 mg/dL (ref 0–200)
HDL: 34 mg/dL — ABNORMAL LOW (ref >39)
LDL Cholesterol: 101 mg/dL — ABNORMAL HIGH (ref 0–99)
Total CHOL/HDL Ratio: 5.4 Ratio
Triglycerides: 241 mg/dL — ABNORMAL HIGH (ref <150)
VLDL: 48 mg/dL — ABNORMAL HIGH (ref 0–40)

## 2010-12-15 ENCOUNTER — Encounter: Payer: Self-pay | Admitting: Family Medicine

## 2010-12-15 ENCOUNTER — Ambulatory Visit (INDEPENDENT_AMBULATORY_CARE_PROVIDER_SITE_OTHER): Payer: Medicare Other | Admitting: Family Medicine

## 2010-12-15 VITALS — BP 95/55 | HR 84 | Wt 181.0 lb

## 2010-12-15 DIAGNOSIS — Z23 Encounter for immunization: Secondary | ICD-10-CM

## 2010-12-15 DIAGNOSIS — J019 Acute sinusitis, unspecified: Secondary | ICD-10-CM

## 2010-12-15 DIAGNOSIS — K859 Acute pancreatitis without necrosis or infection, unspecified: Secondary | ICD-10-CM

## 2010-12-15 DIAGNOSIS — Z0000 Encounter for general adult medical examination without abnormal findings: Secondary | ICD-10-CM

## 2010-12-15 MED ORDER — TERBINAFINE HCL 250 MG PO TABS: 250.0000 mg | ORAL_TABLET | Freq: Every day | ORAL | Status: AC

## 2010-12-15 NOTE — Patient Instructions (Signed)
Start a regular exercise program and make sure you are eating a healthy diet Try to eat 4 servings of dairy a day or take a calcium supplement (500mg twice a day). Your vaccines are up to date.   

## 2010-12-15 NOTE — Progress Notes (Signed)
Subjective:    Cheryl [name] is a 70 y.o. female who presents for Medicare Annual/Subsequent preventive examination.  Preventive Screening-Counseling & Management  Tobacco History  Smoking status  . Never Smoker   Smokeless tobacco  . Not on file     Problems Prior to Visit 1.   Current Problems (verified) Patient Active Problem List  Diagnoses  . ONYCHOMYCOSIS  . ANXIETY  . HEMORRHOIDS, INTERNAL  . PULMONARY NODULE  . DIVERTICULOSIS, COLON  . CONSTIPATION, CHRONIC  . IRRITABLE BOWEL SYNDROME  . UNSPECIFIED DISORDER OF KIDNEY AND URETER  . MICROSCOPIC HEMATURIA  . ARTHRITIS  . NECK PAIN  . STENOSIS, LUMBAR SPINE  . OSTEOPENIA  . INSOMNIA  . POSTMENOPAUSAL STATUS  . PALPITATIONS  . Pulmonary nodule    Medications Prior to Visit Current Outpatient Prescriptions on File Prior to Visit  Medication Sig Dispense Refill  . aspirin 81 MG tablet Take 81 mg by mouth daily.        . calcium-vitamin D (OSCAL) 250-125 MG-UNIT per tablet Take 1 tablet by mouth 2 (two) times daily.        . Cholecalciferol (VITAMIN D) 1000 UNITS capsule Take 1,000 Units by mouth daily.        . Flaxseed, Linseed, 1000 MG CAPS Take by mouth 3 (three) times daily.        . fluticasone (FLONASE) 50 MCG/ACT nasal spray       . methocarbamol (ROBAXIN) 750 MG tablet       . Multiple Vitamin (MULTIVITAMIN) tablet Take 1 tablet by mouth daily.        Marland Kitchen omeprazole (PRILOSEC) 20 MG capsule Take 1 capsule (20 mg total) by mouth 2 (two) times daily.  60 capsule  6  . PARoxetine (PAXIL) 20 MG tablet TAKE ONE TABLET BY MOUTH EVERY DAY  90 tablet  0  . RESTASIS 0.05 % ophthalmic emulsion       . traMADol (ULTRAM) 50 MG tablet       . traZODone (DESYREL) 50 MG tablet TAKE ONE-HALF TO ONE TABLET BY MOUTH EVERY DAY AT BEDTIME  30 tablet  1   Current Facility-Administered Medications on File Prior to Visit  Medication Dose Route Frequency Provider Last Rate Last Dose  . 0.9 %  sodium chloride infusion  500  mL Intravenous Continuous Yancey Flemings, MD        Current Medications (verified) Current Outpatient Prescriptions  Medication Sig Dispense Refill  . aspirin 81 MG tablet Take 81 mg by mouth daily.        . calcium-vitamin D (OSCAL) 250-125 MG-UNIT per tablet Take 1 tablet by mouth 2 (two) times daily.        . Cholecalciferol (VITAMIN D) 1000 UNITS capsule Take 1,000 Units by mouth daily.        . Flaxseed, Linseed, 1000 MG CAPS Take by mouth 3 (three) times daily.        . fluticasone (FLONASE) 50 MCG/ACT nasal spray       . methocarbamol (ROBAXIN) 750 MG tablet       . Multiple Vitamin (MULTIVITAMIN) tablet Take 1 tablet by mouth daily.        Marland Kitchen omeprazole (PRILOSEC) 20 MG capsule Take 1 capsule (20 mg total) by mouth 2 (two) times daily.  60 capsule  6  . PARoxetine (PAXIL) 20 MG tablet TAKE ONE TABLET BY MOUTH EVERY DAY  90 tablet  0  . RESTASIS 0.05 % ophthalmic emulsion       .  traMADol (ULTRAM) 50 MG tablet       . traZODone (DESYREL) 50 MG tablet TAKE ONE-HALF TO ONE TABLET BY MOUTH EVERY DAY AT BEDTIME  30 tablet  1  . simvastatin (ZOCOR) 40 MG tablet Take 40 mg by mouth at bedtime.         Current Facility-Administered Medications  Medication Dose Route Frequency Provider Last Rate Last Dose  . 0.9 %  sodium chloride infusion  500 mL Intravenous Continuous Yancey Flemings, MD         Allergies (verified) Codeine sulfate; Fluoxetine hcl; and Sulfonamide derivatives   PAST HISTORY  Family History Family History  Problem Relation Age of Onset  . Alcohol abuse Father   . Lung cancer Father     smoker  . Kidney failure Mother     med induced  . Depression Brother   . Heart disease      Paternal family     Social History History  Substance Use Topics  . Smoking status: Never Smoker   . Smokeless tobacco: Not on file  . Alcohol Use: No     Are there smokers in your home (other than you)? No  Risk Factors Current exercise habits: Home exercise routine includes walking  30 min hrs per days.  Dietary issues discussed: Eats healthy   Cardiac risk factors: advanced age (older than 69 for men, 71 for women) and sedentary lifestyle.  Depression Screen (Note: if answer to either of the following is "Yes", a more complete depression screening is indicated)   Over the past two weeks, have you felt down, depressed or hopeless? No  Over the past two weeks, have you felt little interest or pleasure in doing things? No  Have you lost interest or pleasure in daily life? No  Do you often feel hopeless? No  Do you cry easily over simple problems? No  Activities of Daily Living In your present state of health, do you have any difficulty performing the following activities?:  Driving? No Managing money?  No Feeding yourself? No Getting from bed to chair? No Climbing a flight of stairs? No Preparing food and eating?: No Bathing or showering? No Getting dressed: No Getting to the toilet? No Using the toilet:No Moving around from place to place: No In the past year have you fallen or had a near fall?:No   Are you sexually active?  No  Do you have more than one partner?  No  Hearing Difficulties: Yes Do you often ask people to speak up or repeat themselves? No Do you experience ringing or noises in your ears? No Do you have difficulty understanding soft or whispered voices? No   Do you feel that you have a problem with memory? Yes  Do you often misplace items? No  Do you feel safe at home?  Yes  Cognitive Testing  Alert? Yes  Normal Appearance?Yes  Oriented to person? Yes  Place? Yes   Time? Yes  Recall of three objects?  Yes  Can perform simple calculations? Yes  Displays appropriate judgment?Yes  Can read the correct time from a watch face?Yes   Advanced Directives have been discussed with the patient? Yes  List the Names of Other Physician/Practitioners you currently use: 1.    Indicate any recent Medical Services you may have received from other  than [facility] providers in the past year (date may be approximate).  Immunization History  Administered Date(s) Administered  . Influenza Whole 11/08/2004, 11/08/2005, 11/24/2006, 11/12/2007, 12/15/2008, 11/27/2009  .  Pneumococcal Polysaccharide 02/07/2005  . Td 02/07/2002  . Zoster 11/27/2009    Screening Tests Health Maintenance  Topic Date Due  . Pneumococcal Polysaccharide Vaccine Age 69 And Over  04/13/2007  . Influenza Vaccine  11/08/2011  . Mammogram  12/15/2011  . Tetanus/tdap  02/08/2012  . Colonoscopy  10/07/2015  . Zostavax  Completed    All answers were reviewed with the patient and necessary referrals were made:  Eniya Cannady, MD   12/15/2010   History reviewed: allergies, current medications, past family history, past medical history, past social history, past surgical history and problem list  Review of Systems A comprehensive review of systems was negative except for: Occ SOB since she ws in the hosp 2 wks ago for pancreatitis    Objective:     Vision by Snellen chart: right eye: Had her optho exam last month     There is no height on file to calculate BMI. BP 95/55  Pulse 84  Wt 181 lb (82.101 kg)  BP 95/55  Pulse 84  Wt 181 lb (82.101 kg)  General Appearance:    Alert, cooperative, no distress, appears stated age  Head:    Normocephalic, without obvious abnormality, atraumatic  Eyes:    PERRL, conjunctiva/corneas clear, EOM's intact, fundi    benign, both eyes  Ears:    Normal TM's and external ear canals, both ears  Nose:   Nares normal, septum midline, mucosa normal, no drainage    or sinus tenderness  Throat:   Lips, mucosa, and tongue normal; teeth and gums normal  Neck:   Supple, symmetrical, trachea midline, no adenopathy;    thyroid:  no enlargement/tenderness/nodules; no carotid   bruit or JVD  Back:     Symmetric, no curvature, ROM normal, no CVA tenderness  Lungs:     Clear to auscultation bilaterally, respirations unlabored  Chest Wall:     No tenderness or deformity   Heart:    Regular rate and rhythm, S1 and S2 normal, no murmur, rub   or gallop  Breast Exam:    No tenderness, masses, or nipple abnormality  Abdomen:     Soft, non-tender, bowel sounds active all four quadrants,    no masses, no organomegaly  Genitalia:    Normal female without lesion, discharge or tenderness  Rectal:    Normal tone, normal prostate, no masses or tenderness;   guaiac negative stool  Extremities:   Extremities normal, atraumatic, no cyanosis or edema  Pulses:   2+ and symmetric all extremities  Skin:   Skin color, texture, turgor normal, no rashes or lesions  Lymph nodes:   Cervical, supraclavicular, and axillary nodes normal  Neurologic:   CNII-XII intact, normal strength, sensation and reflexes    throughout       Assessment:     Annual Wellness Exam, Medicare.      Plan:     During the course of the visit the patient was educated and counseled about appropriate screening and preventive services including:    Pneumococcal vaccine   Screening mammography  Diet review for nutrition referral? Yes ____  Not Indicated _x__   Patient Instructions (the written plan) was given to the patient.  Medicare Attestation I have personally reviewed: The patient's medical and social history Their use of alcohol, tobacco or illicit drugs Their current medications and supplements The patient's functional ability including ADLs,fall risks, home safety risks, cognitive, and hearing and visual impairment Diet and physical activities Evidence for depression or mood  disorders  The patient's weight, height, BMI, and visual acuity have been recorded in the chart.  I have made referrals, counseling, and provided education to the patient based on review of the above and I have provided the patient with a written personalized care plan for preventive services.     Josede Cicero, MD   12/15/2010

## 2010-12-16 ENCOUNTER — Ambulatory Visit (HOSPITAL_COMMUNITY)
Admission: RE | Admit: 2010-12-16 | Discharge: 2010-12-16 | Disposition: A | Discharge status: Home / Self Care | Payer: Medicare Other | Source: Ambulatory Visit | Attending: Family Medicine | Admitting: Family Medicine

## 2010-12-16 DIAGNOSIS — Z1231 Encounter for screening mammogram for malignant neoplasm of breast: Secondary | ICD-10-CM | POA: Insufficient documentation

## 2010-12-16 LAB — COMPLETE METABOLIC PANEL WITH GFR
ALT: 14 U/L (ref 0–35)
AST: 23 U/L (ref 0–37)
Albumin: 4.6 g/dL (ref 3.5–5.2)
Alkaline Phosphatase: 91 U/L (ref 39–117)
BUN: 14 mg/dL (ref 6–23)
CO2: 29 mEq/L (ref 19–32)
Calcium: 10.6 mg/dL — ABNORMAL HIGH (ref 8.4–10.5)
Chloride: 101 mEq/L (ref 96–112)
Creat: 0.97 mg/dL (ref 0.50–1.10)
GFR, Est African American: 69 mL/min — ABNORMAL LOW (ref >89)
GFR, Est Non African American: 60 mL/min — ABNORMAL LOW (ref >89)
Glucose, Bld: 92 mg/dL (ref 70–99)
Potassium: 4.6 mEq/L (ref 3.5–5.3)
Sodium: 139 mEq/L (ref 135–145)
Total Bilirubin: 0.4 mg/dL (ref 0.3–1.2)
Total Protein: 7.7 g/dL (ref 6.0–8.3)

## 2010-12-16 LAB — CBC
HCT: 41.5 % (ref 36.0–46.0)
Hemoglobin: 12.8 g/dL (ref 12.0–15.0)
MCH: 26 pg (ref 26.0–34.0)
MCHC: 30.8 g/dL (ref 30.0–36.0)
MCV: 84.3 fL (ref 78.0–100.0)
Platelets: 347 10*3/uL (ref 150–400)
RBC: 4.92 MIL/uL (ref 3.87–5.11)
RDW: 15.3 % (ref 11.5–15.5)
WBC: 6.1 10*3/uL (ref 4.0–10.5)

## 2010-12-16 LAB — LIPASE: Lipase: 67 U/L (ref 0–75)

## 2010-12-16 LAB — AMYLASE: Amylase: 59 U/L (ref 0–105)

## 2010-12-23 ENCOUNTER — Encounter: Payer: Self-pay | Admitting: Internal Medicine

## 2010-12-23 ENCOUNTER — Ambulatory Visit (INDEPENDENT_AMBULATORY_CARE_PROVIDER_SITE_OTHER): Payer: Medicare Other | Admitting: Internal Medicine

## 2010-12-23 VITALS — BP 122/78 | HR 101 | Ht 65.0 in | Wt 183.8 lb

## 2010-12-23 DIAGNOSIS — K859 Acute pancreatitis without necrosis or infection, unspecified: Secondary | ICD-10-CM

## 2010-12-23 NOTE — Patient Instructions (Signed)
We will contact you to schedule your endoscopic ultrasound.

## 2010-12-23 NOTE — Progress Notes (Signed)
HISTORY OF PRESENT ILLNESS:  Cheryl [name] is a 70 y.o. female with hyperlipidemia, arthritis, and chronic back pain. She is followed in this office for a history of GERD, constipation predominant IBS, and adenomatous colon polyps. She was last seen in August 2012 when she underwent screening colonoscopy(negative exam in 2002) which revealed 3 adenomatous colon polyps and melanosis coli. Followup in 5 years recommended. She did well until 11/29/2010 when she presented to [facility] with acute abdominal pain. She describes bandlike severe upper abdominal pain with radiation into the back. One episode of vomiting. Laboratory evaluation revealed amylase greater than 1400 and lipase greater than 2000. White blood cell count was 10.1. Liver tests were abnormal with SGOT 212, SGPT 83, alkaline phosphatase 85, and total bilirubin 0.8. Abdominal ultrasound was reported to be normal with no evidence of gallstones. The pancreatic duct was said to be upper limits of normal. Subsequent contrast-enhanced CT scan of the abdomen and pelvis was said to show a normal pancreas. Incidental pulmonary nodules noted (the patient and her daughter are aware as is there PCP per their report). She was treated with supportive measures. Laboratory abnormalities improved and the patient was discharged home on October 25. Outpatient endoscopic ultrasound recommended by the consultant. Patient wished to resume her GI care here in [place]. Since returning home, patient reports no problems with pain. Appetite is good. Her only complaint is fatigue and mild shortness of breath. She denies a prior history of similar pain. She does not use alcohol. No new medications. No medications felt to be at high risk for pancreatitis. She had used some naproxen. No family history of pancreatitis. No complaints of abdominal pain or unexplained weight loss in the months or weeks preceding. Review of laboratories finds modest elevation of  triglycerides at 241. Inpatient testing for viral hepatitis was negative. Outpatient laboratories from 12-15-10 reveal normal comprehensive metabolic panel including normal liver tests. Mild elevation of calcium 10.6. Normal in the hospital. Normal CBC. Normal glucose. Previous CT and MRI in December of 2009, to evaluate abdominal pain and renal lesion did not reveal pancreatic abnormalities. Other GI complaints including increased gas, GERD, and constipation. She is accompanied today by her daughter  REVIEW OF SYSTEMS:  All non-GI ROS negative except for sinus allergy trouble, arthritis, back pain, fatigue, shortness of breath, headaches, increased thirst, excessive urination  Past Medical History  Diagnosis Date  . Constipation   . Arthritis   . Depression   . GERD (gastroesophageal reflux disease)   . Hyperlipidemia   . Osteopenia   . Bronchitis     saw md 10-05-10  . Uterine cancer 1973  . Diverticulosis   . Lung nodules   . Irritable bowel syndrome   . Bulging discs   . Spinal stenosis   . Bone spur   . Sigmoid diverticulitis   . Internal hemorrhoids   . Migraine headache   . Adenomatous colon polyp 2012  . Cyst, kidney, acquired   . Pancreatitis   . Hepatitis 2012    Past Surgical History  Procedure Date  . Appendectomy 1966  . Vaginal hysterectomy 1973  . Rotator cuff repair 2009    left  . Rectocele repair 2011  . Tonsillectomy   . Cataracts     bilateral    Social History VERENICE WESTRICH  reports that she quit smoking about 17 years ago. She has never used smokeless tobacco. She reports that she does not drink alcohol or use illicit drugs.  family history includes Alcohol abuse in her brother and father; Crohn's disease in her brother; Depression in her brother; HIV in her son; Heart disease in an unspecified family member; Heart failure in her daughter; Kidney failure in her mother; Lung cancer in her father and son; and Rheum arthritis in her mother.  Allergies   Allergen Reactions  . Codeine Sulfate     REACTION: pruritis  . Fluoxetine Hcl     REACTION: itching,SOB  . Sulfonamide Derivatives     REACTION: Nausea       PHYSICAL EXAMINATION: Vital signs: BP 122/78  Pulse 101  Ht 5\' 5"  (1.651 m)  Wt 183 lb 12.8 oz (83.371 kg)  BMI 30.59 kg/m2  SpO2 98%  Constitutional: generally well-appearing, no acute distress Psychiatric: alert and oriented x3, cooperative Eyes: extraocular movements intact, anicteric, conjunctiva pink Mouth: oral pharynx moist, no lesions Neck: supple no lymphadenopathy Cardiovascular: heart regular rate and rhythm, no murmur Lungs: clear to auscultation bilaterally Abdomen: soft, obese, nontender, nondistended, no obvious ascites, no peritoneal signs, normal bowel sounds, no organomegaly Extremities: no lower extremity edema bilaterally Skin: no lesions on visible extremities Neuro: No focal deficits.   ASSESSMENT:  #5. 70 year old female with her initial bout of acute pancreatitis associated with significantly abnormal liver tests. Negative ultrasound and contrast CT imaging studies, by report, from an outside facility. Specifically, no identification of gallstones or obvious pancreatic abnormalities. No obvious risk factors for pancreatitis (does not use alcohol, non-offending medications, mild hypertriglyceridemia). Followup recent outpatient laboratories with completely normal liver tests, as well as pancreatic enzymes. Despite the lack of imaging support, biliary pancreatitis seems most plausible. However, in this age group, an occult pancreatic lesion must be excluded.  #2. History of adenomatous polyps on colonoscopy August 2012 #3. Constipation predominant irritable bowel syndrome #4. GERD    PLAN:  #1. The patient should undergo endoscopic ultrasound 6-8 weeks after her bout of pancreatitis. I would like to set this up with Dr. Rob Bunting. At that time, Dr. Christella Hartigan can assess the pancreatic gland for  structural abnormalities (i.e., mass lesion), anatomic variants (such as pancreatic divisum), as well as assess the gallbladder and bile duct for evidence of stones. I discussed the nature of the procedure as well as the risks, benefits, and alternatives with the patient and her daughter. They understand and agree to proceed.

## 2010-12-24 ENCOUNTER — Other Ambulatory Visit: Payer: Self-pay | Admitting: Family Medicine

## 2010-12-24 ENCOUNTER — Telehealth: Payer: Self-pay

## 2010-12-24 ENCOUNTER — Other Ambulatory Visit: Payer: Self-pay | Admitting: Gastroenterology

## 2010-12-24 DIAGNOSIS — K861 Other chronic pancreatitis: Secondary | ICD-10-CM

## 2010-12-24 NOTE — Telephone Encounter (Signed)
Left message on machine to call back  

## 2010-12-24 NOTE — Telephone Encounter (Signed)
Pt has been scheduled and meds reviewed instructions mailed to the home

## 2010-12-24 NOTE — Telephone Encounter (Signed)
Message copied by Donata Duff on Fri Dec 24, 2010  1:12 PM ------      Message from: Rachael Fee      Created: Fri Dec 24, 2010 12:03 PM       Patriciaann Clan get her on the schedule.                  Naz Denunzio,      Please schedule her for upper EUS, radial +/- linear, ,  Next available time. Does not need propofol, dx recent pancreatitis            Thanks                        ----- Message -----         From: Yancey Flemings, MD         Sent: 12/23/2010   5:37 PM           To: Rob Bunting, MD            Jesusita Oka, I appreciate your help with this nice lady

## 2010-12-31 ENCOUNTER — Other Ambulatory Visit: Payer: Self-pay | Admitting: Family Medicine

## 2011-01-07 ENCOUNTER — Other Ambulatory Visit: Payer: Self-pay | Admitting: Family Medicine

## 2011-01-14 ENCOUNTER — Other Ambulatory Visit: Payer: Self-pay | Admitting: Family Medicine

## 2011-02-10 ENCOUNTER — Encounter (HOSPITAL_COMMUNITY): Payer: Self-pay | Admitting: *Deleted

## 2011-02-10 ENCOUNTER — Encounter (HOSPITAL_COMMUNITY)
Admission: RE | Disposition: A | Discharge status: Home / Self Care | Payer: Self-pay | Source: Ambulatory Visit | Attending: Gastroenterology

## 2011-02-10 ENCOUNTER — Other Ambulatory Visit: Payer: Self-pay | Admitting: Gastroenterology

## 2011-02-10 ENCOUNTER — Ambulatory Visit (HOSPITAL_COMMUNITY)
Admission: RE | Admit: 2011-02-10 | Discharge: 2011-02-10 | Disposition: A | Discharge status: Home / Self Care | Payer: Medicare Other | Source: Ambulatory Visit | Attending: Gastroenterology | Admitting: Gastroenterology

## 2011-02-10 DIAGNOSIS — K859 Acute pancreatitis without necrosis or infection, unspecified: Secondary | ICD-10-CM

## 2011-02-10 DIAGNOSIS — K3189 Other diseases of stomach and duodenum: Secondary | ICD-10-CM

## 2011-02-10 DIAGNOSIS — Z8542 Personal history of malignant neoplasm of other parts of uterus: Secondary | ICD-10-CM | POA: Insufficient documentation

## 2011-02-10 DIAGNOSIS — M949 Disorder of cartilage, unspecified: Secondary | ICD-10-CM | POA: Insufficient documentation

## 2011-02-10 DIAGNOSIS — E785 Hyperlipidemia, unspecified: Secondary | ICD-10-CM | POA: Insufficient documentation

## 2011-02-10 DIAGNOSIS — M899 Disorder of bone, unspecified: Secondary | ICD-10-CM | POA: Insufficient documentation

## 2011-02-10 DIAGNOSIS — K2990 Gastroduodenitis, unspecified, without bleeding: Secondary | ICD-10-CM | POA: Insufficient documentation

## 2011-02-10 DIAGNOSIS — K2970 Gastritis, unspecified, without bleeding: Secondary | ICD-10-CM

## 2011-02-10 DIAGNOSIS — Z90710 Acquired absence of both cervix and uterus: Secondary | ICD-10-CM | POA: Insufficient documentation

## 2011-02-10 DIAGNOSIS — K219 Gastro-esophageal reflux disease without esophagitis: Secondary | ICD-10-CM | POA: Insufficient documentation

## 2011-02-10 DIAGNOSIS — Z8719 Personal history of other diseases of the digestive system: Secondary | ICD-10-CM | POA: Insufficient documentation

## 2011-02-10 DIAGNOSIS — R1013 Epigastric pain: Secondary | ICD-10-CM

## 2011-02-10 HISTORY — PX: EUS: SHX5427

## 2011-02-10 SURGERY — UPPER ENDOSCOPIC ULTRASOUND (EUS) RADIAL
Anesthesia: Moderate Sedation

## 2011-02-10 MED ORDER — FENTANYL CITRATE 0.05 MG/ML IJ SOLN
INTRAMUSCULAR | Status: DC | PRN
  Administered 2011-02-10 (×3): 25 ug via INTRAVENOUS

## 2011-02-10 MED ORDER — MIDAZOLAM HCL 10 MG/2ML IJ SOLN
INTRAMUSCULAR | Status: DC | PRN
  Administered 2011-02-10 (×4): 2 mg via INTRAVENOUS

## 2011-02-10 MED ORDER — FENTANYL CITRATE 0.05 MG/ML IJ SOLN
INTRAMUSCULAR | Status: AC
  Filled 2011-02-10: qty 4

## 2011-02-10 MED ORDER — BUTAMBEN-TETRACAINE-BENZOCAINE 2-2-14 % EX AERO
INHALATION_SPRAY | CUTANEOUS | Status: DC | PRN
  Administered 2011-02-10: 2 via TOPICAL

## 2011-02-10 MED ORDER — SODIUM CHLORIDE 0.9 % IV SOLN
Freq: Once | INTRAVENOUS | Status: AC
  Administered 2011-02-10: 500 mL via INTRAVENOUS

## 2011-02-10 MED ORDER — MIDAZOLAM HCL 10 MG/2ML IJ SOLN
INTRAMUSCULAR | Status: AC
  Filled 2011-02-10: qty 4

## 2011-02-10 NOTE — H&P (Signed)
  HPI: This is a very pleasant woman with mild acute pancreatitis 2-3 months ago without clear etiology    Past Medical History  Diagnosis Date  . Constipation   . Arthritis   . Depression   . GERD (gastroesophageal reflux disease)   . Hyperlipidemia   . Osteopenia   . Bronchitis     saw md 10-05-10  . Uterine cancer 1973  . Diverticulosis   . Lung nodules   . Irritable bowel syndrome   . Bulging discs   . Spinal stenosis   . Bone spur   . Sigmoid diverticulitis   . Internal hemorrhoids   . Migraine headache   . Adenomatous colon polyp 2012  . Cyst, kidney, acquired   . Pancreatitis   . Hepatitis 2012    Past Surgical History  Procedure Date  . Appendectomy 1966  . Vaginal hysterectomy 1973  . Rotator cuff repair 2009    left  . Rectocele repair 2011  . Tonsillectomy   . Cataracts     bilateral    No current facility-administered medications for this encounter.    Allergies as of 12/24/2010 - Review Complete 12/23/2010  Allergen Reaction Noted  . Codeine sulfate  12/20/2005  . Fluoxetine hcl  12/20/2005  . Sulfonamide derivatives  12/20/2005    Family History  Problem Relation Age of Onset  . Alcohol abuse Father   . Lung cancer Father     smoker  . Kidney failure Mother     med induced  . Depression Brother   . Heart disease      Paternal family   . Heart failure Daughter   . Lung cancer Son   . Crohn's disease Brother   . Rheum arthritis Mother   . Alcohol abuse Brother   . HIV Son     History   Social History  . Marital Status: Single    Spouse Name: N/A    Number of Children: 4  . Years of Education: N/A   Occupational History  . Retired      Social History Main Topics  . Smoking status: Former Smoker    Quit date: 02/07/1993  . Smokeless tobacco: Never Used  . Alcohol Use: No  . Drug Use: No  . Sexually Active: Not on file   Other Topics Concern  . Not on file   Social History Narrative   Walking daily.         Physical Exam: There were no vitals taken for this visit. Constitutional: generally well-appearing Psychiatric: alert and oriented x3 Abdomen: soft, nontender, nondistended, no obvious ascites, no peritoneal signs, normal bowel sounds     Assessment and plan: 71 y.o. female with recent acute pancreatitis  FOR EUS today to evaluate pancreas, CBD.

## 2011-02-10 NOTE — Op Note (Signed)
[facility] [address] [place], [place]  [address]  ENDOSCOPIC ULTRASOUND PROCEDURE REPORT  PATIENT:  Cheryl [name], Cheryl [name]  MR#:  [id number] BIRTHDATE:  1941/01/18  GENDER:  female ENDOSCOPIST:  Rachael Fee, MD REFERRED BY:  Wilhemina Bonito. Eda Keys., M.D. PROCEDURE DATE:  02/10/2011 PROCEDURE:  Upper EUS, EGD with biopsy ASA CLASS:  Class II INDICATIONS:  recent mild acute pancreatitis, elevated transaminases, US showed no gallstones in GB, non-drinker; still dyspeptic MEDICATIONS:  Fentanyl 75 mcg IV, Versed 8 mg IV  DESCRIPTION OF PROCEDURE:   After the risks benefits and alternatives of the procedure were  explained, informed consent was obtained. The patient was then placed in the left, lateral, decubitus postion and IV sedation was administered. Throughout the procedure, the patient's blood pressure, pulse and oxygen saturations were monitored continuously.  Under direct visualization, the  endoscope was introduced through the mouth and advanced to the second portion of the duodenum.  Water was used as necessary to provide an acoustic interface.  Upon completion of the imaging, water was removed and the patient was sent to the recovery room in satisfactory condition. <<PROCEDUREIMAGES>> Endoscopic findings (with radial echoendoscope): 1. Normal esophagus 2. Mild, non-specific gastritis. Biopsies taken and sent to pathology 3. Normal duodenum  EUS findings: 1. Pancreatic parenchyma was normal throughout.  There were no solid or cystic masses and no signs of chronic pancreatitis. 2. Main pancreatic duct was normal; non-dilated. This examination did not rule out pancreatic divisum. 3. CBD was normal; non-dilated and contained no stones 4. Gallbladder was normal (limited views) without stones 5. No peripancreatic adenopathy. 6. Limited views of liver, spleen, portal and splenic vessels were all normal.  Impression: Normal pancreas, normal CBD.  This examination  did not rule out pancreatic divisum.  There was mild gastritis which was biopsied to check for H. pylori given her dyspepsia.  If she has repeat episode of pancreatitis, could consider elective ("empiric") cholecystectomy on the possibility that  non-visualized debris or sludge in biliary tree is causing her symptoms.  ______________________________ Rachael Fee, MD  n. eSIGNED:   Rachael Fee at 02/10/2011 10:32 AM  [place], [id number]

## 2011-02-14 ENCOUNTER — Encounter (HOSPITAL_COMMUNITY): Payer: Self-pay | Admitting: Gastroenterology

## 2011-02-23 ENCOUNTER — Telehealth: Payer: Self-pay | Admitting: *Deleted

## 2011-02-23 DIAGNOSIS — E785 Hyperlipidemia, unspecified: Secondary | ICD-10-CM

## 2011-02-23 NOTE — Telephone Encounter (Signed)
I spoke woth Cheryl [name] at [facility] and she gave me the order # [id number] (NMR Lypoprofile) I can't get it to pull up. Please advise

## 2011-02-23 NOTE — Telephone Encounter (Signed)
We can call [facility] and see. I don't find it when I search. This is suualy a send out for particle size.

## 2011-02-23 NOTE — Telephone Encounter (Signed)
Faxed to [facility].

## 2011-02-23 NOTE — Telephone Encounter (Signed)
Pt called to have lipid panel order placed but she is also wanting to get a LDL Particle Side screening done. Can I place this with the lipid panel?

## 2011-02-23 NOTE — Telephone Encounter (Signed)
We can't search by codes so I found something that might work. Can fax to [facility] and see if looks right.

## 2011-03-05 LAB — NMR LIPOPROFILE WITH LIPIDS
Cholesterol, Total: 244 mg/dL — ABNORMAL HIGH (ref <200)
HDL Particle Number: 28 umol/L — ABNORMAL LOW (ref >=30.5)
HDL-C: 45 mg/dL (ref >=40)
LDL (calc): 138 mg/dL — ABNORMAL HIGH (ref <100)
LDL Particle Number: 2241 nmol/L — ABNORMAL HIGH (ref <1000)
LDL Size: 20.5 nm — ABNORMAL LOW (ref >20.5)
LP-IR Score: 72 — ABNORMAL HIGH (ref <=45)
Small LDL Particle Number: 926 nmol/L — ABNORMAL HIGH (ref <=527)
Triglycerides: 303 mg/dL — ABNORMAL HIGH (ref <150)

## 2011-03-10 ENCOUNTER — Ambulatory Visit (INDEPENDENT_AMBULATORY_CARE_PROVIDER_SITE_OTHER): Payer: Medicare Other | Admitting: Family Medicine

## 2011-03-10 ENCOUNTER — Encounter: Payer: Self-pay | Admitting: Family Medicine

## 2011-03-10 VITALS — BP 122/58 | HR 78 | Wt 186.0 lb

## 2011-03-10 DIAGNOSIS — E669 Obesity, unspecified: Secondary | ICD-10-CM

## 2011-03-10 DIAGNOSIS — E785 Hyperlipidemia, unspecified: Secondary | ICD-10-CM

## 2011-03-10 NOTE — Progress Notes (Signed)
  Subjective:    Patient ID: Cheryl [name], female    DOB: 1940/06/11, 71 y.o.   MRN: [id number]  HPI Here to fullow up on cholesterol. We drew a more explicit cholesterol panel that includes LDL and HDL particle size. I had her come back in today so that we could review it. She's been on and off of her statin. She tolerates it well without any significant side effects which is not convinced that she really needs to take it.   Review of Systems     Objective:   Physical Exam  Constitutional: She is oriented to person, place, and time. She appears well-developed and well-nourished.  HENT:  Head: Normocephalic and atraumatic.  Cardiovascular: Normal rate, regular rhythm and normal heart sounds.   Pulmonary/Chest: Effort normal and breath sounds normal.  Neurological: She is alert and oriented to person, place, and time.  Skin: Skin is warm and dry.  Psychiatric: She has a normal mood and affect. Her behavior is normal.          Assessment & Plan:  Hypercholesterolemia-we reviewed her results today. I really encouraged to get back on her statin. I called in a prescription for 40 mg that she was previously taking 20. She says she feels more comfortable taking 20 mg and will restart that by cutting her tablets in half. Otherwise she is doing well and her blood pressures well controlled today. We did discuss again regular exercise, low fat diet, and weight loss. Follow Up in 6 months.

## 2011-03-16 ENCOUNTER — Other Ambulatory Visit: Payer: Self-pay | Admitting: Family Medicine

## 2011-04-15 ENCOUNTER — Other Ambulatory Visit: Payer: Self-pay | Admitting: Family Medicine

## 2011-05-14 ENCOUNTER — Other Ambulatory Visit: Payer: Self-pay | Admitting: Family Medicine

## 2011-05-24 ENCOUNTER — Encounter: Payer: Self-pay | Admitting: Family Medicine

## 2011-05-24 ENCOUNTER — Ambulatory Visit (INDEPENDENT_AMBULATORY_CARE_PROVIDER_SITE_OTHER): Payer: Medicare Other | Admitting: Family Medicine

## 2011-05-24 ENCOUNTER — Ambulatory Visit
Admission: RE | Admit: 2011-05-24 | Discharge: 2011-05-24 | Disposition: A | Discharge status: Home / Self Care | Payer: Medicare Other | Source: Ambulatory Visit | Attending: Family Medicine | Admitting: Family Medicine

## 2011-05-24 VITALS — BP 101/57 | HR 81 | Ht 65.0 in | Wt 188.0 lb

## 2011-05-24 DIAGNOSIS — M5430 Sciatica, unspecified side: Secondary | ICD-10-CM

## 2011-05-24 DIAGNOSIS — R200 Anesthesia of skin: Secondary | ICD-10-CM

## 2011-05-24 DIAGNOSIS — M259 Joint disorder, unspecified: Secondary | ICD-10-CM

## 2011-05-24 DIAGNOSIS — J392 Other diseases of pharynx: Secondary | ICD-10-CM

## 2011-05-24 DIAGNOSIS — M25579 Pain in unspecified ankle and joints of unspecified foot: Secondary | ICD-10-CM

## 2011-05-24 DIAGNOSIS — M542 Cervicalgia: Secondary | ICD-10-CM

## 2011-05-24 DIAGNOSIS — M5440 Lumbago with sciatica, unspecified side: Secondary | ICD-10-CM

## 2011-05-24 NOTE — Patient Instructions (Signed)
We will call you with the x-ray results. 

## 2011-05-24 NOTE — Progress Notes (Signed)
Subjective:    Patient ID: Cheryl [name], female    DOB: 12-Aug-1940, 71 y.o.   MRN: [id number]  HPI She has several concerns today.   #1 lumbar back pain-she's been working with [facility] for several months and says she is not really getting that much better. She's noticed some mild improvement. She says she is still having some sciatic-type symptoms that are worse on the right compared to left. She would like to get an x-ray of her lumbar spine.  #2 neck pain-she's had neck pain for several years. She's also been working with [facility] on this. She is now having numbness in both hands that comes and goes. She also has persistent neck pain and never really gets full relief. She also is now having shoulder pain bilaterally. She says sometimes it wakes her up in the middle the night. She did have an x-ray of her cervical spine about a year ago.  #3 left ankle pain. She said she severely sprained ankle about 5 years ago. More recently been throbbing and waking her up at night. Her pain is mostly on the outside of the ankle. It's also been swelling intermittently but is not swollen today. She denies any repeat injury recently.  #4 right wrist nodule-she said about 4 weeks ago she noticed a stinging type pain while she was sitting and watching television. Later that evening she noticed a very large bruise over the posterior part of her wrist and forearm. She said eventually the bruise faded but when it did she noticed a knot in the center of that area near her posterior wrist. She says is not tender and otherwise doesn't bother her that was not there previously. She also denies trauma to the wrist.  #5 her dentist noticed a spot on the left side of her throat. He had not noticed previously and recommended that she follow with her PCP. She denies any throat pain or fever or other cold-type symptoms. She has seen Dr. Annalee Genta, ENT in the past. She denies any dysphasia.   Review of Systems  BP  101/57  Pulse 81  Ht 5\' 5"  (1.651 m)  Wt 188 lb (85.276 kg)  BMI 31.28 kg/m2    Allergies  Allergen Reactions  . Codeine Sulfate     REACTION: pruritis  . Fluoxetine Hcl     REACTION: itching,SOB  . Sulfonamide Derivatives     REACTION: Nausea    Past Medical History  Diagnosis Date  . Constipation   . Arthritis   . Depression   . GERD (gastroesophageal reflux disease)   . Hyperlipidemia   . Osteopenia   . Bronchitis     saw md 10-05-10  . Uterine cancer 1973  . Diverticulosis   . Lung nodules   . Irritable bowel syndrome   . Bulging discs   . Spinal stenosis   . Bone spur   . Sigmoid diverticulitis   . Internal hemorrhoids   . Migraine headache   . Adenomatous colon polyp 2012  . Cyst, kidney, acquired   . Pancreatitis   . Hepatitis 2012    Past Surgical History  Procedure Date  . Appendectomy 1966  . Vaginal hysterectomy 1973  . Rotator cuff repair 2009    left  . Rectocele repair 2011  . Tonsillectomy   . Cataracts     bilateral  . Eus 02/10/2011    Procedure: UPPER ENDOSCOPIC ULTRASOUND (EUS) RADIAL;  Surgeon: Rob Bunting, MD;  Location: [facility];  Service: Endoscopy;  Laterality: N/A;  radial linear     History   Social History  . Marital Status: Single    Spouse Name: N/A    Number of Children: 4  . Years of Education: N/A   Occupational History  . Retired      Social History Main Topics  . Smoking status: Former Smoker    Quit date: 02/07/1993  . Smokeless tobacco: Never Used  . Alcohol Use: No  . Drug Use: No  . Sexually Active: Not on file   Other Topics Concern  . Not on file   Social History Narrative   Walking daily.      Family History  Problem Relation Age of Onset  . Alcohol abuse Father   . Lung cancer Father     smoker  . Kidney failure Mother     med induced  . Rheum arthritis Mother   . Depression Brother   . Heart disease      Paternal family   . Heart failure Daughter   . Lung cancer Son   . Crohn's  disease Brother   . Alcohol abuse Brother   . HIV Son   . Malignant hyperthermia Neg Hx     Outpatient Encounter Prescriptions as of 05/24/2011  Medication Sig Dispense Refill  . aspirin 81 MG tablet Take 81 mg by mouth daily.        . calcium-vitamin D (OSCAL) 250-125 MG-UNIT per tablet Take 1 tablet by mouth daily.       . Cholecalciferol (VITAMIN D) 1000 UNITS capsule Take 1,000 Units by mouth daily.        . Flaxseed, Linseed, 1000 MG CAPS Take by mouth 3 (three) times daily.        . fluticasone (FLONASE) 50 MCG/ACT nasal spray       . methocarbamol (ROBAXIN) 750 MG tablet       . Multiple Vitamin (MULTIVITAMIN) tablet Take 1 tablet by mouth daily.        Marland Kitchen omeprazole (PRILOSEC) 20 MG capsule Take 1 capsule (20 mg total) by mouth 2 (two) times daily.  60 capsule  6  . PARoxetine (PAXIL) 20 MG tablet TAKE ONE TABLET BY MOUTH EVERY DAY  90 tablet  3  . RESTASIS 0.05 % ophthalmic emulsion       . simvastatin (ZOCOR) 40 MG tablet Take 20 mg by mouth at bedtime.       . terbinafine (LAMISIL) 250 MG tablet Take 1 tablet (250 mg total) by mouth daily. X 12 weeks.  30 tablet  2  . traMADol (ULTRAM) 50 MG tablet       . traZODone (DESYREL) 50 MG tablet TAKE ONE-HALF TO ONE TABLET BY MOUTH EVERY DAY AT BEDTIME  30 tablet  0          Objective:   Physical Exam  Constitutional: She is oriented to person, place, and time. She appears well-developed and well-nourished.  HENT:  Head: Normocephalic and atraumatic.  Right Ear: External ear normal.  Left Ear: External ear normal.  Mouth/Throat: Oropharynx is clear and moist.       I am unable to see the lesion that she was talking about on the left side that her dentist saw.  Eyes: Conjunctivae are normal. Pupils are equal, round, and reactive to light.  Cardiovascular: Normal rate, regular rhythm and normal heart sounds.   Pulmonary/Chest: Effort normal and breath sounds normal.  Musculoskeletal: She exhibits no edema.  Left ankle  with normal range of motion. She is mildly tender over the lateral malleolus. No swelling today.  Neurological: She is alert and oriented to person, place, and time.  Skin: Skin is warm and dry.       She does have a hard palpable, mobile nodule on the right posterior wrist. It seems to be attached to the blood vessel there.  Psychiatric: She has a normal mood and affect. Her behavior is normal.          Assessment & Plan:  Cervical pain with radicular symptoms-I would like to schedule her for a cervical MRI. This has been ongoing for several years and she had an x-ray last year ago. Now she is having hand numbness I would like to make sure she's not having any significant impingement. She's been working with [facility] for months and not getting any significant relief. She can continue to use Tylenol and NSAIDs as needed for pain relief.  Lumbar back pain-we will get an x-ray today since it has also been an ongoing problem and she is also working with [facility] on this as well.  Lesion on the throat-unclear etiology. I am unable to see the lesion myself today. She has seen Dr. Annalee Genta in the past I recommend that we schedule followup with him again to reexamine the area.  Wrist lesion-again unclear etiology. This appears to be some type of scar tissue on the blood vessel. Not sure what would have triggered this. She has not had any other easy bruising or bleeding recently.  Left ankle pain-she denies any reinjury but does have an old sprain care. We will get an x-ray today to see she has any arthritis.

## 2011-06-04 ENCOUNTER — Ambulatory Visit
Admission: RE | Admit: 2011-06-04 | Discharge: 2011-06-04 | Disposition: A | Discharge status: Home / Self Care | Payer: Medicare Other | Source: Ambulatory Visit | Attending: Family Medicine | Admitting: Family Medicine

## 2011-06-04 DIAGNOSIS — R200 Anesthesia of skin: Secondary | ICD-10-CM

## 2011-06-04 DIAGNOSIS — M542 Cervicalgia: Secondary | ICD-10-CM

## 2011-06-06 ENCOUNTER — Other Ambulatory Visit: Payer: Self-pay | Admitting: Family Medicine

## 2011-06-06 DIAGNOSIS — M542 Cervicalgia: Secondary | ICD-10-CM

## 2011-06-06 DIAGNOSIS — M47812 Spondylosis without myelopathy or radiculopathy, cervical region: Secondary | ICD-10-CM

## 2011-06-06 DIAGNOSIS — M5410 Radiculopathy, site unspecified: Secondary | ICD-10-CM

## 2011-06-14 ENCOUNTER — Other Ambulatory Visit: Payer: Self-pay | Admitting: Family Medicine

## 2011-07-06 ENCOUNTER — Telehealth: Payer: Self-pay | Admitting: Family Medicine

## 2011-07-06 NOTE — Telephone Encounter (Signed)
Left message on vm to call back if ok to schedule CT

## 2011-07-06 NOTE — Telephone Encounter (Signed)
Call patient: She needs a repeat CT of the chest to followup on a pulmonary nodule seen on a CT scan that she had done back in October.  I believe it was sent [facility]. We can set her up for the CT at [facility] imaging here in town. Let me know if okay to go ahead and schedule.

## 2011-07-07 ENCOUNTER — Telehealth: Payer: Self-pay | Admitting: *Deleted

## 2011-07-07 DIAGNOSIS — R911 Solitary pulmonary nodule: Secondary | ICD-10-CM

## 2011-07-07 NOTE — Telephone Encounter (Signed)
Pt states it is ok to schedule the CT Chest at [facility] here in [place].

## 2011-07-08 NOTE — Telephone Encounter (Signed)
We'll schedule CT. Referral entered.

## 2011-07-11 ENCOUNTER — Ambulatory Visit: Payer: Medicare Other | Attending: Neurological Surgery | Admitting: Physical Therapy

## 2011-07-11 DIAGNOSIS — M2560 Stiffness of unspecified joint, not elsewhere classified: Secondary | ICD-10-CM | POA: Insufficient documentation

## 2011-07-11 DIAGNOSIS — IMO0001 Reserved for inherently not codable concepts without codable children: Secondary | ICD-10-CM | POA: Insufficient documentation

## 2011-07-11 DIAGNOSIS — M2550 Pain in unspecified joint: Secondary | ICD-10-CM | POA: Insufficient documentation

## 2011-07-13 ENCOUNTER — Encounter: Payer: Self-pay | Admitting: Physician Assistant

## 2011-07-13 ENCOUNTER — Ambulatory Visit (INDEPENDENT_AMBULATORY_CARE_PROVIDER_SITE_OTHER): Payer: Medicare Other | Admitting: Physician Assistant

## 2011-07-13 ENCOUNTER — Other Ambulatory Visit: Payer: Self-pay | Admitting: Family Medicine

## 2011-07-13 ENCOUNTER — Ambulatory Visit
Admission: RE | Admit: 2011-07-13 | Discharge: 2011-07-13 | Disposition: A | Discharge status: Home / Self Care | Payer: Medicare Other | Source: Ambulatory Visit | Attending: Family Medicine | Admitting: Family Medicine

## 2011-07-13 ENCOUNTER — Telehealth: Payer: Self-pay | Admitting: *Deleted

## 2011-07-13 VITALS — BP 110/59 | HR 83 | Temp 98.7°F | Ht 65.0 in | Wt 190.0 lb

## 2011-07-13 DIAGNOSIS — Z79899 Other long term (current) drug therapy: Secondary | ICD-10-CM

## 2011-07-13 DIAGNOSIS — A6920 Lyme disease, unspecified: Secondary | ICD-10-CM

## 2011-07-13 DIAGNOSIS — R911 Solitary pulmonary nodule: Secondary | ICD-10-CM

## 2011-07-13 DIAGNOSIS — N2889 Other specified disorders of kidney and ureter: Secondary | ICD-10-CM

## 2011-07-13 MED ORDER — DOXYCYCLINE HYCLATE 100 MG PO TABS: 100.0000 mg | ORAL_TABLET | Freq: Two times a day (BID) | ORAL | Status: AC

## 2011-07-13 NOTE — Telephone Encounter (Signed)
Prior Authorization obtained for PT Chest without contrast. Auth # is [id number] exp: 08/27/2011. Alvino Chapel at [employer]

## 2011-07-13 NOTE — Progress Notes (Signed)
  Subjective:    Patient ID: Cheryl [name], female    DOB: 10/04/1940, 71 y.o.   MRN: [id number]  HPI Patient was bitten by a tick about 5 days ago on right shoulder. She pulled it off and has since noticed a rash around the area. She checks herself daily for ticks [place]. She denies any fever but did have chills yesterday. She denies any nausea/vomiting, muscle weakness. She has not had any GI symptoms. She has had muscle/joint pain but reports that this is ongoing. Rash does not itch or is not painful. She has not tried anything on it.  Patient needs HgA1C and CMP but was canceled in October due to pancreatitis. We need to get these labs today.  Review of Systems     Objective:   Physical Exam  Constitutional: She is oriented to person, place, and time. She appears well-developed and well-nourished.  HENT:  Head: Normocephalic and atraumatic.  Eyes: Conjunctivae are normal.  Neck: Normal range of motion. Neck supple.  Cardiovascular: Normal rate, regular rhythm and normal heart sounds.   Pulmonary/Chest: Effort normal and breath sounds normal. She has no wheezes.  Lymphadenopathy:    She has no cervical adenopathy.  Neurological: She is alert and oriented to person, place, and time.  Skin:       Right shoulder there was an ring of erythema surrounding a central area of clearing. Neg for swollen lymphnodes in right axilla. Warm to the touch.  Psychiatric: She has a normal mood and affect. Her behavior is normal.          Assessment & Plan:   Erythema migricans- suspect lyme's disease due to classic rash. Will get lyme antibodies. Went ahead and treated with doxycycline for 20 days. Already had 10 days at home so wrote for another 10 days.  Will call with lab results. Gave handout on lymes and warned of adverse effects. Can continue to take tylenol or motrin for any fever or pain. Call if symptoms worsen.  Medication Management/Screening for  diabetes- HgA1C and CMP were ordered today. Will call with results.

## 2011-07-13 NOTE — Patient Instructions (Addendum)
Will give Doxy for another 10 days to take in junction with doxy that patient already has. Will call with results in 1-2 days. Develop fever or pains take tylenol. If not improving or symptoms worsen never hesitate to call. Will get HgA1c and CMP.   Lyme Disease You may have been bitten by a tick and are to watch for the development of Lyme Disease. Lyme Disease is an infection that is caused by a bacteria The bacteria causing this disease is named Borreilia burgdorferi. If a tick is infected with this bacteria and then bites you, then Lyme Disease may occur. These ticks are carried by deer and rodents such as rabbits and mice and infest grassy as well as forested areas. Fortunately most tick bites do not cause Lyme Disease.  Lyme Disease is easier to prevent than to treat. First, covering your legs with clothing when walking in areas where ticks are possibly abundant will prevent their attachment because ticks tend to stay within inches of the ground. Second, using insecticides containing DEET can be applied on skin or clothing. Last, because it takes about 12 to 24 hours for the tick to transmit the disease after attachment to the human host, you should inspect your body for ticks twice a day when you are in areas where Lyme Disease is common. You must look thoroughly when searching for ticks. The Ixodes tick that carries Lyme Disease is very small. It is around the size of a sesame seed (picture of tick is not actual size). Removal is best done by grasping the tick by the head and pulling it out. Do not to squeeze the body of the tick. This could inject the infecting bacteria into the bite site. Wash the area of the bite with an antiseptic solution after removal.  Lyme Disease is a disease that may affect many body systems. Because of the small size of the biting tick, most people do not notice being bitten. The first sign of an infection is usually a round red rash that extends out from the center of the  tick bite. The center of the lesion may be blood colored (hemorrhagic) or have tiny blisters (vesicular). Most lesions have bright red outer borders and partial central clearing. This rash may extend out many inches in diameter, and multiple lesions may be present. Other symptoms such as fatigue, headaches, chills and fever, general achiness and swelling of lymph glands may also occur. If this first stage of the disease is left untreated, these symptoms may gradually resolve by themselves, or progressive symptoms may occur because of spread of infection to other areas of the body.  Follow up with your caregiver to have testing and treatment if you have a tick bite and you develop any of the above complaints. Your caregiver may recommend preventative (prophylactic) medications which kill bacteria (antibiotics). Once a diagnosis of Lyme Disease is made, antibiotic treatment is highly likely to cure the disease. Effective treatment of late stage Lyme Disease may require longer courses of antibiotic therapy.  MAKE SURE YOU:   Understand these instructions.   Will watch your condition.   Will get help right away if you are not doing well or get worse.  Document Released: 05/02/2000 Document Revised: 01/13/2011 Document Reviewed: 07/04/2008 [facility] Patient Information 2012 [place], [place].

## 2011-07-14 LAB — HEMOGLOBIN A1C
Hgb A1c MFr Bld: 6 % — ABNORMAL HIGH (ref <5.7)
Mean Plasma Glucose: 126 mg/dL — ABNORMAL HIGH (ref <117)

## 2011-07-14 LAB — COMPLETE METABOLIC PANEL WITH GFR
ALT: 15 U/L (ref 0–35)
AST: 20 U/L (ref 0–37)
Albumin: 4.3 g/dL (ref 3.5–5.2)
Alkaline Phosphatase: 76 U/L (ref 39–117)
BUN: 11 mg/dL (ref 6–23)
CO2: 29 mEq/L (ref 19–32)
Calcium: 9.5 mg/dL (ref 8.4–10.5)
Chloride: 104 mEq/L (ref 96–112)
Creat: 0.86 mg/dL (ref 0.50–1.10)
GFR, Est African American: 80 mL/min
GFR, Est Non African American: 69 mL/min
Glucose, Bld: 82 mg/dL (ref 70–99)
Potassium: 4.3 mEq/L (ref 3.5–5.3)
Sodium: 141 mEq/L (ref 135–145)
Total Bilirubin: 0.3 mg/dL (ref 0.3–1.2)
Total Protein: 6.8 g/dL (ref 6.0–8.3)

## 2011-07-14 LAB — B. BURGDORFI ANTIBODIES: B burgdorferi Ab IgG+IgM: 0.2 {ISR}

## 2011-07-15 ENCOUNTER — Encounter: Payer: Medicare Other | Admitting: Physical Therapy

## 2011-07-15 ENCOUNTER — Other Ambulatory Visit: Payer: Medicare Other

## 2011-07-19 ENCOUNTER — Ambulatory Visit: Payer: Medicare Other | Admitting: Physical Therapy

## 2011-07-22 ENCOUNTER — Ambulatory Visit: Payer: Medicare Other | Admitting: Physical Therapy

## 2011-07-25 ENCOUNTER — Other Ambulatory Visit: Payer: Self-pay | Admitting: *Deleted

## 2011-07-25 MED ORDER — OMEPRAZOLE 20 MG PO CPDR: 20.0000 mg | DELAYED_RELEASE_CAPSULE | Freq: Two times a day (BID) | ORAL | Status: DC

## 2011-07-26 ENCOUNTER — Ambulatory Visit: Payer: Medicare Other | Admitting: Physical Therapy

## 2011-07-27 ENCOUNTER — Telehealth: Payer: Self-pay | Admitting: *Deleted

## 2011-07-27 NOTE — Telephone Encounter (Signed)
Prior Auth obtained for MRI w/o contrast for kidney mass.  [id number] and is good for 45 days.

## 2011-07-29 ENCOUNTER — Telehealth: Payer: Self-pay | Admitting: *Deleted

## 2011-07-29 ENCOUNTER — Other Ambulatory Visit: Payer: Self-pay | Admitting: Family Medicine

## 2011-07-29 ENCOUNTER — Emergency Department
Admission: EM | Admit: 2011-07-29 | Discharge: 2011-07-29 | Disposition: A | Discharge status: Home / Self Care | Payer: Medicare Other | Source: Home / Self Care

## 2011-07-29 ENCOUNTER — Encounter: Payer: Medicare Other | Admitting: Physical Therapy

## 2011-07-29 ENCOUNTER — Encounter: Payer: Self-pay | Admitting: *Deleted

## 2011-07-29 DIAGNOSIS — L309 Dermatitis, unspecified: Secondary | ICD-10-CM

## 2011-07-29 DIAGNOSIS — N2889 Other specified disorders of kidney and ureter: Secondary | ICD-10-CM

## 2011-07-29 DIAGNOSIS — Z79899 Other long term (current) drug therapy: Secondary | ICD-10-CM

## 2011-07-29 DIAGNOSIS — L259 Unspecified contact dermatitis, unspecified cause: Secondary | ICD-10-CM

## 2011-07-29 LAB — POCT CBC W AUTO DIFF (K'VILLE URGENT CARE)

## 2011-07-29 NOTE — Telephone Encounter (Signed)
Order entered

## 2011-07-29 NOTE — ED Notes (Signed)
Pt was bit 2 wks ago by a tick and was tested for Lymes and treated with Doxycycline.  Pt test was neg and took doxy for 14 days.  Pt now has a rash on her face and back

## 2011-07-29 NOTE — ED Provider Notes (Signed)
History     CSN: [id number]  Arrival date & time 07/29/11  1931   None     Chief Complaint  Patient presents with  . Rash     HPI Comments: Patient found a tick on her right shoulder about 3 weeks ago.  She later noticed an annular rash at the bite site and visited her PCP for treatment.  She was started on doxycycline.  Initial Lyme's disease antibodies were negative.  The rash resolved and patient did not finish her doxycycline (had approximately 12 tabs left).  Today, she noticed an erythematous nontender eruption on her right face, and a recurrent rash at the tick bite site on her right shoulder.  She has been somewhat fatigued and has had a mild cough.  She notes that she had a number of mosquito bites over the past week.  She notes that she had a steroid C-spine injection yesterday for chronic neck pain.  She complains of myalgias and mild headache, but no fevers, chills, and sweats   The history is provided by the patient and a relative.    Past Medical History  Diagnosis Date  . Constipation   . Arthritis   . Depression   . GERD (gastroesophageal reflux disease)   . Hyperlipidemia   . Osteopenia   . Bronchitis     saw md 10-05-10  . Uterine cancer 1973  . Diverticulosis   . Lung nodules   . Irritable bowel syndrome   . Bulging discs   . Spinal stenosis   . Bone spur   . Sigmoid diverticulitis   . Internal hemorrhoids   . Migraine headache   . Adenomatous colon polyp 2012  . Cyst, kidney, acquired   . Pancreatitis   . Hepatitis 2012    Past Surgical History  Procedure Date  . Appendectomy 1966  . Vaginal hysterectomy 1973  . Rotator cuff repair 2009    left  . Rectocele repair 2011  . Tonsillectomy   . Cataracts     bilateral  . Eus 02/10/2011    Procedure: UPPER ENDOSCOPIC ULTRASOUND (EUS) RADIAL;  Surgeon: Rob Bunting, MD;  Location: [facility];  Service: Endoscopy;  Laterality: N/A;  radial linear     Family History  Problem Relation Age of Onset    . Alcohol abuse Father   . Lung cancer Father     smoker  . Kidney failure Mother     med induced  . Rheum arthritis Mother   . Depression Brother   . Heart disease      Paternal family   . Heart failure Daughter   . Lung cancer Son   . Crohn's disease Brother   . Alcohol abuse Brother   . HIV Son   . Malignant hyperthermia Neg Hx     History  Substance Use Topics  . Smoking status: Former Smoker    Quit date: 02/07/1993  . Smokeless tobacco: Never Used  . Alcohol Use: No    OB History    Grav Para Term Preterm Abortions TAB SAB Ect Mult Living                  Review of Systems  Constitutional: Positive for fatigue. Negative for fever and chills.  HENT: Negative for ear pain, congestion, sore throat and facial swelling.   Respiratory: Positive for cough.   Cardiovascular: Negative.   Gastrointestinal: Negative.   Genitourinary: Negative.   Musculoskeletal: Positive for myalgias. Negative for arthralgias.  Skin:  Positive for rash.  Neurological: Positive for headaches.    Allergies  Codeine sulfate; Fluoxetine hcl; and Sulfonamide derivatives  Home Medications   Current Outpatient Rx  Name Route Sig Dispense Refill  . ASPIRIN 81 MG PO TABS Oral Take 81 mg by mouth daily.      Marland Kitchen CALCIUM-VITAMIN D 250-125 MG-UNIT PO TABS Oral Take 1 tablet by mouth daily.     Marland Kitchen VITAMIN D 1000 UNITS PO CAPS Oral Take 1,000 Units by mouth daily.      Marland Kitchen FLAXSEED (LINSEED) 1000 MG PO CAPS Oral Take by mouth 3 (three) times daily.      Marland Kitchen FLUTICASONE PROPIONATE 50 MCG/ACT NA SUSP      . METHOCARBAMOL 750 MG PO TABS      . ONE-DAILY MULTI VITAMINS PO TABS Oral Take 1 tablet by mouth daily.      Marland Kitchen OMEPRAZOLE 20 MG PO CPDR Oral Take 1 capsule (20 mg total) by mouth 2 (two) times daily. 60 capsule 6  . PAROXETINE HCL 20 MG PO TABS  TAKE ONE TABLET BY MOUTH EVERY DAY 90 tablet 3  . RESTASIS 0.05 % OP EMUL      . SIMVASTATIN 40 MG PO TABS Oral Take 20 mg by mouth at bedtime.     .  TERBINAFINE HCL 250 MG PO TABS Oral Take 1 tablet (250 mg total) by mouth daily. X 12 weeks. 30 tablet 2  . TRAMADOL HCL 50 MG PO TABS      . TRAZODONE HCL 50 MG PO TABS  TAKE ONE-HALF TO ONE TABLET BY MOUTH EVERY DAY AT BEDTIME 30 tablet 0    BP 141/84  Pulse 91  Temp 98.7 F (37.1 C) (Oral)  Resp 18  Ht 5\' 5"  (1.651 m)  Wt 187 lb (84.823 kg)  BMI 31.12 kg/m2  SpO2 97%  Physical Exam  HENT:  Head:         There is mild erythema without tenderness over the right face and cheek.  Skin:          Over the right shoulder trapezius area is a faint erythematous annular lesion, nontender, about 5cm by 8cm   Nursing notes and Vital Signs reviewed. Appearance:  Patient appears stated age, and in no acute distress.  She is alert and oriented.  Patient is obese (BMI 31.2) Eyes:  Pupils are equal, round, and reactive to light and accomodation.  Extraocular movement is intact.  Conjunctivae are not inflamed  Ears:  Canals normal.  Tympanic membranes normal.  Nose:   Normal.  No sinus tenderness.   Pharynx:  Normal Neck:  Supple.   No adenopathy Lungs:  Clear to auscultation.  Breath sounds are equal.  Heart:  Regular rate and rhythm without murmurs, rubs, or gallops.  Abdomen:  Nontender without masses or hepatosplenomegaly.  Bowel sounds are present.  No CVA or flank tenderness.  Extremities:  No edema.  No calf tenderness Neurologic:  Cranial nerves normal   ED Course  Procedures  none   Labs Reviewed  POCT CBC W AUTO DIFF ([facility])  WBC 10.7; LY 19.5; MO 2.1; GR 78.4; Hgb 11.5; Platelets 223   [place] VIRUS AB, IGG AND IGM pending  ROCKY MTN SPOTTED FVR AB, IGG-BLOOD pending  ROCKY MTN SPOTTED FVR AB, IGM-BLOOD pending  B. BURGDORFI ANTIBODIES pending      1. Dermatitis;  Erythematous lesion right shoulder is suggestive of ECM.  With history of recent tick bite, will  resume doxycycline       MDM  Tick borne disease antibodies pending.  Also consider [place] Virus with history of recent multiple mosquito bites.  Resume doxycycline twice daily.  Monitor temperature.  Rest, increase fluid intake. If symptoms become significantly worse during the night or over the weekend, proceed to the local emergency room.  Followup with Family Doctor in 3 days.         Lattie Haw, MD 07/30/11 [phone]

## 2011-07-29 NOTE — Discharge Instructions (Signed)
Resume doxycycline twice daily.  Monitor temperature.  Rest, increase fluid intake. If symptoms become significantly worse during the night or over the weekend, proceed to the local emergency room.

## 2011-07-30 ENCOUNTER — Inpatient Hospital Stay: Admission: RE | Admit: 2011-07-30 | Payer: Medicare Other | Source: Ambulatory Visit

## 2011-07-30 ENCOUNTER — Ambulatory Visit
Admission: RE | Admit: 2011-07-30 | Discharge: 2011-07-30 | Disposition: A | Discharge status: Home / Self Care | Payer: Medicare Other | Source: Ambulatory Visit | Attending: Family Medicine | Admitting: Family Medicine

## 2011-07-30 DIAGNOSIS — N2889 Other specified disorders of kidney and ureter: Secondary | ICD-10-CM

## 2011-07-30 MED ORDER — GADOBENATE DIMEGLUMINE 529 MG/ML IV SOLN
15.0000 mL | Freq: Once | INTRAVENOUS | Status: AC | PRN
  Administered 2011-07-30: 15 mL via INTRAVENOUS

## 2011-07-31 ENCOUNTER — Telehealth: Payer: Self-pay | Admitting: Family Medicine

## 2011-08-01 ENCOUNTER — Encounter: Payer: Self-pay | Admitting: Physician Assistant

## 2011-08-01 ENCOUNTER — Ambulatory Visit (INDEPENDENT_AMBULATORY_CARE_PROVIDER_SITE_OTHER): Payer: Medicare Other | Admitting: Physician Assistant

## 2011-08-01 VITALS — BP 124/72 | HR 79 | Temp 99.1°F | Ht 65.0 in | Wt 184.0 lb

## 2011-08-01 DIAGNOSIS — R5383 Other fatigue: Secondary | ICD-10-CM

## 2011-08-01 DIAGNOSIS — R5381 Other malaise: Secondary | ICD-10-CM

## 2011-08-01 DIAGNOSIS — W57XXXA Bitten or stung by nonvenomous insect and other nonvenomous arthropods, initial encounter: Secondary | ICD-10-CM

## 2011-08-01 DIAGNOSIS — S30860A Insect bite (nonvenomous) of lower back and pelvis, initial encounter: Secondary | ICD-10-CM

## 2011-08-01 LAB — B. BURGDORFI ANTIBODIES: B burgdorferi Ab IgG+IgM: 0.24 {ISR}

## 2011-08-01 MED ORDER — DOXYCYCLINE HYCLATE 50 MG PO CAPS: 50.0000 mg | ORAL_CAPSULE | Freq: Two times a day (BID) | ORAL | Status: AC

## 2011-08-01 NOTE — Patient Instructions (Addendum)
Will give another 7 days to have 14 days of not broken therapy for lyme's disease. Will recheck CBC after completed. Will refer to infectious disease if not improving with symptoms.

## 2011-08-02 ENCOUNTER — Encounter: Payer: Medicare Other | Admitting: Physical Therapy

## 2011-08-02 LAB — WEST NILE ANTIBODIES, IGG AND IGM
West Nile Virus, IgG: <1.3 index (ref <1.30)
West Nile Virus, IgM: <0.9 index (ref <0.90)

## 2011-08-02 NOTE — Progress Notes (Signed)
  Subjective:    Patient ID: Cheryl [name], female    DOB: May 03, 1940, 71 y.o.   MRN: [id number]  HPI Patient presents to the clinic to follow up after visit to urgent care this weekend. Pt was seen in our office about 1 week ago with a tick bite and a rash that looked a lot like erythema migrans. Antibodies to lyme's disease were tested and negative but patient was still given a 10 day treatment of Doxy. Pt stopped her Doxy early and felt fine. 1 day after stopping a the rash came back on her shoulder and on her face. She went to UC. Cbc showed elevated WBC, [place], [place] spotted fever, and another lyme antibodies were tested. Lyme's was negative again and the others didn't come back. She has not felt good since Friday. She reports her joints hurt, she can't sleep, and she feels tired all the time. She has read up on lyme's disease and feels like she has it. She is currently on doxy that UC gave her enough for a few days. She denies any fever, chills, SOB. She has had myalgias and arthragias.   Review of Systems     Objective:   Physical Exam  Constitutional: She is oriented to person, place, and time. She appears well-developed and well-nourished.  HENT:  Head: Normocephalic and atraumatic.  Cardiovascular: Normal rate, regular rhythm, normal heart sounds and intact distal pulses.   Pulmonary/Chest: Effort normal and breath sounds normal.  Neurological: She is alert and oriented to person, place, and time.  Skin:       NO skin rash/dermatitis today.  Psychiatric: She has a normal mood and affect. Her behavior is normal.          Assessment & Plan:  Tick Bite/Fatigue- Pt request to be tested with Western Blot. Ordered and will call with results. Told her we would let her know about [place] and [facility] spotted fever. I gave her doxy to add to what UC gave her enough for 14 full days of therapy. Told her we would check CBC after finished Doxy to make sure WBC were coming down.  If she still is complaining of problems we could refer her to infectious disease.

## 2011-08-03 LAB — ROCKY MTN SPOTTED FVR AB, IGM-BLOOD: ROCKY MTN SPOTTED FEVER, IGM: 0.07 IV

## 2011-08-03 LAB — ROCKY MTN SPOTTED FVR AB, IGG-BLOOD: RMSF IgG: 0.31 IV

## 2011-08-04 ENCOUNTER — Telehealth: Payer: Self-pay | Admitting: *Deleted

## 2011-08-05 ENCOUNTER — Encounter: Payer: Medicare Other | Admitting: Physical Therapy

## 2011-08-15 ENCOUNTER — Telehealth: Payer: Self-pay | Admitting: *Deleted

## 2011-08-15 DIAGNOSIS — W57XXXA Bitten or stung by nonvenomous insect and other nonvenomous arthropods, initial encounter: Secondary | ICD-10-CM

## 2011-08-15 LAB — CBC WITH DIFFERENTIAL/PLATELET
Basophils Absolute: 0 10*3/uL (ref 0.0–0.1)
Basophils Relative: 0 % (ref 0–1)
Eosinophils Absolute: 0 10*3/uL (ref 0.0–0.7)
Eosinophils Relative: 1 % (ref 0–5)
HCT: 38.6 % (ref 36.0–46.0)
Hemoglobin: 12.8 g/dL (ref 12.0–15.0)
Lymphocytes Relative: 39 % (ref 12–46)
Lymphs Abs: 2.4 10*3/uL (ref 0.7–4.0)
MCH: 26.1 pg (ref 26.0–34.0)
MCHC: 33.2 g/dL (ref 30.0–36.0)
MCV: 78.6 fL (ref 78.0–100.0)
Monocytes Absolute: 0.5 10*3/uL (ref 0.1–1.0)
Monocytes Relative: 8 % (ref 3–12)
Neutro Abs: 3.2 10*3/uL (ref 1.7–7.7)
Neutrophils Relative %: 52 % (ref 43–77)
Platelets: 255 10*3/uL (ref 150–400)
RBC: 4.91 MIL/uL (ref 3.87–5.11)
RDW: 16.9 % — ABNORMAL HIGH (ref 11.5–15.5)
WBC: 6.2 10*3/uL (ref 4.0–10.5)

## 2011-08-15 NOTE — Telephone Encounter (Signed)
Lab order placed.

## 2011-08-17 ENCOUNTER — Other Ambulatory Visit: Payer: Self-pay | Admitting: Family Medicine

## 2011-08-17 LAB — B. BURGDORFI ANTIBODIES BY WB
B burgdorferi IgG Abs (IB): NEGATIVE
B burgdorferi IgM Abs (IB): NEGATIVE

## 2011-08-29 ENCOUNTER — Ambulatory Visit (INDEPENDENT_AMBULATORY_CARE_PROVIDER_SITE_OTHER): Payer: Medicare Other | Admitting: Family Medicine

## 2011-08-29 ENCOUNTER — Encounter: Payer: Self-pay | Admitting: Family Medicine

## 2011-08-29 ENCOUNTER — Ambulatory Visit (INDEPENDENT_AMBULATORY_CARE_PROVIDER_SITE_OTHER): Payer: Medicare Other

## 2011-08-29 VITALS — BP 104/61 | HR 85 | Temp 98.6°F | Wt 185.0 lb

## 2011-08-29 DIAGNOSIS — R143 Flatulence: Secondary | ICD-10-CM

## 2011-08-29 DIAGNOSIS — R141 Gas pain: Secondary | ICD-10-CM

## 2011-08-29 DIAGNOSIS — K5909 Other constipation: Secondary | ICD-10-CM

## 2011-08-29 DIAGNOSIS — R1032 Left lower quadrant pain: Secondary | ICD-10-CM

## 2011-08-29 DIAGNOSIS — R1011 Right upper quadrant pain: Secondary | ICD-10-CM

## 2011-08-29 DIAGNOSIS — R140 Abdominal distension (gaseous): Secondary | ICD-10-CM

## 2011-08-29 DIAGNOSIS — R1012 Left upper quadrant pain: Secondary | ICD-10-CM

## 2011-08-29 DIAGNOSIS — K5900 Constipation, unspecified: Secondary | ICD-10-CM

## 2011-08-29 LAB — CBC W/MCH & 3 PART DIFF
HCT: 39.5 % (ref 36.0–46.0)
Hemoglobin: 12.8 g/dL (ref 12.0–15.0)
Lymphocytes Relative: 34 % (ref 12–46)
Lymphs Abs: 2.3 10*3/uL (ref 0.7–4.0)
MCH: 26.8 pg (ref 26.0–34.0)
MCHC: 32.4 g/dL (ref 30.0–36.0)
MCV: 82.6 fL (ref 78.0–100.0)
Neutro Abs: 3.9 10*3/uL (ref 1.7–7.7)
Neutrophils Relative %: 58 % (ref 43–77)
Platelets: 251 10*3/uL (ref 150–400)
RBC: 4.78 MIL/uL (ref 3.87–5.11)
RDW: 16 % — ABNORMAL HIGH (ref 11.5–15.5)
WBC mixed population %: 8 % (ref 3–18)
WBC mixed population: 0.5 10*3/uL (ref 0.1–1.8)
WBC: 6.8 10*3/uL (ref 4.0–10.5)

## 2011-08-29 NOTE — Progress Notes (Signed)
Subjective:    Patient ID: Cheryl [name], female    DOB: [birth year], 71 y.o.   MRN: [id number]  HPI Left groin pain radiating to the LUQ. Says she is chronically constipated. No  fever or nausea.  Feels more bloated. + hystrectomy. Stil has her ovaries. Pain started 4 days ago. Uncomfortable to walk. Laying flat helps. No pain relievers needed. No chills. Hx of pancreatitis a year ago and this feels similar.    Review of Systems BP 104/61  Pulse 85  Temp 98.6 F (37 C) (Oral)  Wt 185 lb (83.915 kg)    Allergies  Allergen Reactions  . Codeine Sulfate     REACTION: pruritis  . Contrast Media (Iodinated Diagnostic Agents) Itching    Multilance.   . Fluoxetine Hcl     REACTION: itching,SOB  . Sulfonamide Derivatives     REACTION: Nausea    Past Medical History  Diagnosis Date  . Constipation   . Arthritis   . Depression   . GERD (gastroesophageal reflux disease)   . Hyperlipidemia   . Osteopenia   . Bronchitis     saw md 10-05-10  . Uterine cancer 1973  . Diverticulosis   . Lung nodules   . Irritable bowel syndrome   . Bulging discs   . Spinal stenosis   . Bone spur   . Sigmoid diverticulitis   . Internal hemorrhoids   . Migraine headache   . Adenomatous colon polyp 2012  . Cyst, kidney, acquired   . Pancreatitis   . Hepatitis 2012    Past Surgical History  Procedure Date  . Appendectomy 1966  . Vaginal hysterectomy 1973  . Rotator cuff repair 2009    left  . Rectocele repair 2011  . Tonsillectomy   . Cataracts     bilateral  . Eus 02/10/2011    Procedure: UPPER ENDOSCOPIC ULTRASOUND (EUS) RADIAL;  Surgeon: Rob Bunting, MD;  Location: [facility];  Service: Endoscopy;  Laterality: N/A;  radial linear     History   Social History  . Marital Status: Legally Separated    Spouse Name: N/A    Number of Children: 4  . Years of Education: N/A   Occupational History  . Retired      Social History Main Topics  . Smoking status: Former Smoker    Quit  date: 02/07/1993  . Smokeless tobacco: Never Used  . Alcohol Use: No  . Drug Use: No  . Sexually Active: Not on file   Other Topics Concern  . Not on file   Social History Narrative   Walking daily.      Family History  Problem Relation Age of Onset  . Alcohol abuse Father   . Lung cancer Father     smoker  . Kidney failure Mother     med induced  . Rheum arthritis Mother   . Depression Brother   . Heart disease      Paternal family   . Heart failure Daughter   . Lung cancer Son   . Crohn's disease Brother   . Alcohol abuse Brother   . HIV Son   . Malignant hyperthermia Neg Hx     Outpatient Encounter Prescriptions as of 08/29/2011  Medication Sig Dispense Refill  . aspirin 81 MG tablet Take 81 mg by mouth daily.        . calcium-vitamin D (OSCAL) 250-125 MG-UNIT per tablet Take 1 tablet by mouth daily.       Marland Kitchen  Cholecalciferol (VITAMIN D) 1000 UNITS capsule Take 1,000 Units by mouth daily.        . Flaxseed, Linseed, 1000 MG CAPS Take by mouth 3 (three) times daily.        . fluticasone (FLONASE) 50 MCG/ACT nasal spray       . Multiple Vitamin (MULTIVITAMIN) tablet Take 1 tablet by mouth daily.        Marland Kitchen omeprazole (PRILOSEC) 20 MG capsule Take 1 capsule (20 mg total) by mouth 2 (two) times daily.  60 capsule  6  . PARoxetine (PAXIL) 20 MG tablet TAKE ONE TABLET BY MOUTH EVERY DAY  90 tablet  3  . RESTASIS 0.05 % ophthalmic emulsion       . simvastatin (ZOCOR) 40 MG tablet Take 20 mg by mouth at bedtime.       . traMADol (ULTRAM) 50 MG tablet       . traZODone (DESYREL) 50 MG tablet TAKE ONE-HALF TO ONE TABLET BY MOUTH EVERY DAY AT BEDTIME  30 tablet  1  . methocarbamol (ROBAXIN) 750 MG tablet       . terbinafine (LAMISIL) 250 MG tablet Take 1 tablet (250 mg total) by mouth daily. X 12 weeks.  30 tablet  2          Objective:   Physical Exam  Constitutional: She is oriented to person, place, and time. She appears well-developed and well-nourished.  HENT:  Head:  Normocephalic and atraumatic.  Cardiovascular: Normal rate, regular rhythm and normal heart sounds.   Pulmonary/Chest: Effort normal and breath sounds normal.  Abdominal: Soft. She exhibits no distension and no mass. There is tenderness. There is no rebound and no guarding.       She is tender in the right upper quadrant, left upper quadrant and the left lower quadrant. Decreased bowel sounds.  Neurological: She is alert and oriented to person, place, and time.  Skin: Skin is warm and dry.  Psychiatric: She has a normal mood and affect. Her behavior is normal.          Assessment & Plan:  ABdominal Pain. She is complaining of pain in the left lower and left upper quadrants but she is also tender in the right upper quadrant in addition to the other 2 locations. She is afebrile. We will go ahead and get a KUB since she does have a history of chronic constipation. She also had an episode of pancreatitis about a year ago so I will also check amylase and lipase. Check CBC with differential to evaluate for infection such as diverticulitis though she is not having any diarrhea or loose stools. For now make sure staying hydrated. She does not want anything for pain.

## 2011-08-29 NOTE — Patient Instructions (Addendum)
Stay hydrated We will call you with your lab results. If you don't here from [name] in about a week then please give [name] a call at [phone].

## 2011-08-30 LAB — COMPLETE METABOLIC PANEL WITH GFR
ALT: 17 U/L (ref 0–35)
AST: 23 U/L (ref 0–37)
Albumin: 4.1 g/dL (ref 3.5–5.2)
Alkaline Phosphatase: 73 U/L (ref 39–117)
BUN: 13 mg/dL (ref 6–23)
CO2: 31 mEq/L (ref 19–32)
Calcium: 9.9 mg/dL (ref 8.4–10.5)
Chloride: 104 mEq/L (ref 96–112)
Creat: 0.99 mg/dL (ref 0.50–1.10)
GFR, Est African American: 67 mL/min
GFR, Est Non African American: 58 mL/min — ABNORMAL LOW
Glucose, Bld: 96 mg/dL (ref 70–99)
Potassium: 5.1 mEq/L (ref 3.5–5.3)
Sodium: 139 mEq/L (ref 135–145)
Total Bilirubin: 0.3 mg/dL (ref 0.3–1.2)
Total Protein: 6.7 g/dL (ref 6.0–8.3)

## 2011-08-30 LAB — AMYLASE: Amylase: 43 U/L (ref 0–105)

## 2011-08-30 LAB — LIPASE: Lipase: 29 U/L (ref 0–75)

## 2011-10-04 ENCOUNTER — Other Ambulatory Visit: Payer: Self-pay | Admitting: Family Medicine

## 2011-11-14 ENCOUNTER — Other Ambulatory Visit: Payer: Self-pay | Admitting: Family Medicine

## 2011-11-14 ENCOUNTER — Other Ambulatory Visit: Payer: Self-pay | Admitting: Radiation Oncology

## 2011-11-14 DIAGNOSIS — Z1231 Encounter for screening mammogram for malignant neoplasm of breast: Secondary | ICD-10-CM

## 2011-11-18 ENCOUNTER — Other Ambulatory Visit: Payer: Self-pay | Admitting: Family Medicine

## 2011-12-20 ENCOUNTER — Encounter: Payer: Self-pay | Admitting: Family Medicine

## 2011-12-20 ENCOUNTER — Ambulatory Visit (INDEPENDENT_AMBULATORY_CARE_PROVIDER_SITE_OTHER): Payer: Medicare Other | Admitting: Family Medicine

## 2011-12-20 VITALS — BP 126/63 | HR 81 | Ht 65.0 in | Wt 185.0 lb

## 2011-12-20 DIAGNOSIS — M949 Disorder of cartilage, unspecified: Secondary | ICD-10-CM

## 2011-12-20 DIAGNOSIS — Z13220 Encounter for screening for lipoid disorders: Secondary | ICD-10-CM

## 2011-12-20 DIAGNOSIS — N951 Menopausal and female climacteric states: Secondary | ICD-10-CM

## 2011-12-20 DIAGNOSIS — Z23 Encounter for immunization: Secondary | ICD-10-CM

## 2011-12-20 DIAGNOSIS — M899 Disorder of bone, unspecified: Secondary | ICD-10-CM

## 2011-12-20 DIAGNOSIS — Z0000 Encounter for general adult medical examination without abnormal findings: Secondary | ICD-10-CM

## 2011-12-20 DIAGNOSIS — R7309 Other abnormal glucose: Secondary | ICD-10-CM

## 2011-12-20 DIAGNOSIS — R002 Palpitations: Secondary | ICD-10-CM

## 2011-12-20 DIAGNOSIS — R6889 Other general symptoms and signs: Secondary | ICD-10-CM

## 2011-12-20 MED ORDER — ZOLPIDEM TARTRATE 5 MG PO TABS: 5.0000 mg | ORAL_TABLET | Freq: Every evening | ORAL | Status: DC | PRN

## 2011-12-20 NOTE — Addendum Note (Signed)
Addended by: Ellsworth Lennox on: 12/20/2011 04:17 PM   Modules accepted: Orders

## 2011-12-20 NOTE — Progress Notes (Signed)
Subjective:    Cheryl [name] is a 71 y.o. female who presents for Medicare Annual/Subsequent preventive examination.  Preventive Screening-Counseling & Management  Tobacco History  Smoking status  . Former Smoker  . Quit date: 02/07/1993  Smokeless tobacco  . Never Used     Problems Prior to Visit 1. Has been stressed recently bc of moving.    Current Problems (verified) Patient Active Problem List  Diagnosis  . ONYCHOMYCOSIS  . ANXIETY  . HEMORRHOIDS, INTERNAL  . PULMONARY NODULE  . DIVERTICULOSIS, COLON  . CONSTIPATION, CHRONIC  . IRRITABLE BOWEL SYNDROME  . UNSPECIFIED DISORDER OF KIDNEY AND URETER  . MICROSCOPIC HEMATURIA  . ARTHRITIS  . NECK PAIN  . STENOSIS, LUMBAR SPINE  . OSTEOPENIA  . INSOMNIA  . POSTMENOPAUSAL STATUS  . PALPITATIONS  . Pulmonary nodule  . Pancreatitis  . Gastritis  . Dyspepsia  . Obesity    Medications Prior to Visit Current Outpatient Prescriptions on File Prior to Visit  Medication Sig Dispense Refill  . aspirin 81 MG tablet Take 81 mg by mouth daily.        . Cholecalciferol (VITAMIN D) 1000 UNITS capsule Take 1,000 Units by mouth daily.        . fluticasone (FLONASE) 50 MCG/ACT nasal spray       . methocarbamol (ROBAXIN) 750 MG tablet       . omeprazole (PRILOSEC) 20 MG capsule Take 1 capsule (20 mg total) by mouth 2 (two) times daily.  60 capsule  6  . PARoxetine (PAXIL) 20 MG tablet TAKE ONE TABLET BY MOUTH EVERY DAY  90 tablet  3  . RESTASIS 0.05 % ophthalmic emulsion       . simvastatin (ZOCOR) 40 MG tablet Take 20 mg by mouth at bedtime.       . traMADol (ULTRAM) 50 MG tablet Take 1 tablet (50 mg total) by mouth 2 (two) times daily as needed for pain.  180 tablet  0  . AMITIZA 24 MCG capsule TAKE ONE CAPSULE BY MOUTH TWICE DAILY  60 each  1  . calcium-vitamin D (OSCAL) 250-125 MG-UNIT per tablet Take 1 tablet by mouth daily.       . Flaxseed, Linseed, 1000 MG CAPS Take by mouth 3 (three) times daily.        . Multiple  Vitamin (MULTIVITAMIN) tablet Take 1 tablet by mouth daily.        . traZODone (DESYREL) 50 MG tablet TAKE ONE-HALF TO ONE TABLET BY MOUTH EVERY DAY AT BEDTIME  30 tablet  1    Current Medications (verified) Current Outpatient Prescriptions  Medication Sig Dispense Refill  . aspirin 81 MG tablet Take 81 mg by mouth daily.        . Cholecalciferol (VITAMIN D) 1000 UNITS capsule Take 1,000 Units by mouth daily.        . fluticasone (FLONASE) 50 MCG/ACT nasal spray       . methocarbamol (ROBAXIN) 750 MG tablet       . omeprazole (PRILOSEC) 20 MG capsule Take 1 capsule (20 mg total) by mouth 2 (two) times daily.  60 capsule  6  . PARoxetine (PAXIL) 20 MG tablet TAKE ONE TABLET BY MOUTH EVERY DAY  90 tablet  3  . RESTASIS 0.05 % ophthalmic emulsion       . simvastatin (ZOCOR) 40 MG tablet Take 20 mg by mouth at bedtime.       . traMADol (ULTRAM) 50 MG tablet Take 1 tablet (  50 mg total) by mouth 2 (two) times daily as needed for pain.  180 tablet  0  . AMITIZA 24 MCG capsule TAKE ONE CAPSULE BY MOUTH TWICE DAILY  60 each  1  . calcium-vitamin D (OSCAL) 250-125 MG-UNIT per tablet Take 1 tablet by mouth daily.       . Flaxseed, Linseed, 1000 MG CAPS Take by mouth 3 (three) times daily.        . Multiple Vitamin (MULTIVITAMIN) tablet Take 1 tablet by mouth daily.        . traZODone (DESYREL) 50 MG tablet TAKE ONE-HALF TO ONE TABLET BY MOUTH EVERY DAY AT BEDTIME  30 tablet  1     Allergies (verified) Codeine sulfate; Contrast media; Fluoxetine hcl; and Sulfonamide derivatives   PAST HISTORY  Family History Family History  Problem Relation Age of Onset  . Alcohol abuse Father   . Lung cancer Father     smoker  . Kidney failure Mother     med induced  . Rheum arthritis Mother   . Depression Brother   . Heart disease      Paternal family   . Heart failure Daughter   . Lung cancer Son   . Crohn's disease Brother   . Alcohol abuse Brother   . HIV Son   . Malignant hyperthermia Neg Hx       Social History History  Substance Use Topics  . Smoking status: Former Smoker    Quit date: 02/07/1993  . Smokeless tobacco: Never Used  . Alcohol Use: No     Are there smokers in your home (other than you)? No  Risk Factors Current exercise habits: Walk 15-30 min a day.   Dietary issues discussed: Craving sweets.    Cardiac risk factors: advanced age (older than 73 for men, 2 for women).  Depression Screen (Note: if answer to either of the following is "Yes", a more complete depression screening is indicated)   Over the past two weeks, have you felt down, depressed or hopeless? No  Over the past two weeks, have you felt little interest or pleasure in doing things? No  Have you lost interest or pleasure in daily life? No  Do you often feel hopeless? No  Do you cry easily over simple problems? No  Activities of Daily Living In your present state of health, do you have any difficulty performing the following activities?:  Driving? No Managing money?  No Feeding yourself? No Getting from bed to chair? No Climbing a flight of stairs? No Preparing food and eating?: No Bathing or showering? No Getting dressed: No Getting to the toilet? No Using the toilet:No Moving around from place to place: No In the past year have you fallen or had a near fall?:No   Are you sexually active?  No  Do you have more than one partner?  No  Hearing Difficulties: Yes Do you often ask people to speak up or repeat themselves? Yes Do you experience ringing or noises in your ears? Yes Do you have difficulty understanding soft or whispered voices? Yes   Do you feel that you have a problem with memory? Yes  Do you often misplace items? No  Do you feel safe at home?  Yes  Cognitive Testing  Alert? Yes  Normal Appearance?Yes  Oriented to person? Yes  Place? Yes   Time? Yes  Recall of three objects?  Yes  Can perform simple calculations? Yes  Displays appropriate judgment?Yes  Can read  the correct time from a watch face?Yes   Advanced Directives have been discussed with the patient? Yes  List the Names of Other Physician/Practitioners you currently use: 1.    Indicate any recent Medical Services you may have received from other than [facility] providers in the past year (date may be approximate).  Immunization History  Administered Date(s) Administered  . Influenza Split 12/20/2011  . Influenza Whole 11/08/2004, 11/08/2005, 11/24/2006, 11/12/2007, 12/15/2008, 11/27/2009  . Pneumococcal Polysaccharide 02/07/2005, 12/15/2010  . Td 02/07/2002  . Zoster 11/27/2009    Screening Tests Health Maintenance  Topic Date Due  . Influenza Vaccine  10/09/2011  . Tetanus/tdap  02/08/2012  . Mammogram  12/15/2012  . Colonoscopy  10/07/2015  . Pneumococcal Polysaccharide Vaccine Age 65 And Over  Completed  . Zostavax  Completed    All answers were reviewed with the patient and necessary referrals were made:  Caiden Monsivais, MD   12/20/2011   History reviewed: allergies, current medications, past family history, past medical history, past social history, past surgical history and problem list  Review of Systems A comprehensive review of systems was negative.    Objective:     Vision by Snellen chart: right eye:20/20, left eye:20/40  Body mass index is 30.79 kg/(m^2). BP 126/63  Pulse 81  Ht 5\' 5"  (1.651 m)  Wt 185 lb (83.915 kg)  BMI 30.79 kg/m2  BP 126/63  Pulse 81  Ht 5\' 5"  (1.651 m)  Wt 185 lb (83.915 kg)  BMI 30.79 kg/m2  General Appearance:    Alert, cooperative, no distress, appears stated age  Head:    Normocephalic, without obvious abnormality, atraumatic  Eyes:    PERRL, conjunctiva/corneas clear, EOM's intact, both eyes  Ears:    Normal TM's and external ear canals, both ears  Nose:   Nares normal, septum midline, mucosa normal, no drainage    or sinus tenderness  Throat:   Lips, mucosa, and tongue normal; teeth and gums normal  Neck:   Supple,  symmetrical, trachea midline, no adenopathy;    thyroid:  no enlargement/tenderness/nodules; no carotid   bruit or JVD  Back:     Symmetric, no curvature, ROM normal, no CVA tenderness  Lungs:     Clear to auscultation bilaterally, respirations unlabored  Chest Wall:    No tenderness or deformity   Heart:    Regular rate and rhythm, S1 and S2 normal, no murmur, rub   or gallop  Breast Exam:    No tenderness, masses, or nipple abnormality  Abdomen:     Soft, non-tender, bowel sounds active all four quadrants,    no masses, no organomegaly  Genitalia:    Not performed.   Rectal:    Notperfromed.   Extremities:   Extremities normal, atraumatic, no cyanosis or edema  Pulses:   2+ and symmetric all extremities  Skin:   Skin color, texture, turgor normal, no rashes or lesions  Lymph nodes:   Cervical, supraclavicular, and axillary nodes normal  Neurologic:   CNII-XII intact, normal strength, sensation and reflexes    throughout       Assessment:     Annual Wellness Exam     Plan:     During the course of the visit the patient was educated and counseled about appropriate screening and preventive services including:    Influenza vaccine  Screening mammography  Bone densitometry screening  Hearing loss- had full testing last year.  Was told she was still fairly normal.    Has  been very heat sensitive, would like her thyroid checked,   She has weaned herself off trazodone and says her memory has improved.    Diet review for nutrition referral? Yes ____  Not Indicated __x__   Patient Instructions (the written plan) was given to the patient.  Medicare Attestation I have personally reviewed: The patient's medical and social history Their use of alcohol, tobacco or illicit drugs Their current medications and supplements The patient's functional ability including ADLs,fall risks, home safety risks, cognitive, and hearing and visual impairment Diet and physical  activities Evidence for depression or mood disorders  The patient's weight, height, BMI, and visual acuity have been recorded in the chart.  I have made referrals, counseling, and provided education to the patient based on review of the above and I have provided the patient with a written personalized care plan for preventive services.     Laritza Vokes, MD   12/20/2011

## 2011-12-21 ENCOUNTER — Telehealth: Payer: Self-pay | Admitting: Family Medicine

## 2011-12-21 LAB — LIPID PANEL
Cholesterol: 180 mg/dL (ref 0–200)
HDL: 49 mg/dL (ref >39)
LDL Cholesterol: 99 mg/dL (ref 0–99)
Total CHOL/HDL Ratio: 3.7 Ratio
Triglycerides: 161 mg/dL — ABNORMAL HIGH (ref <150)
VLDL: 32 mg/dL (ref 0–40)

## 2011-12-21 LAB — COMPLETE METABOLIC PANEL WITH GFR
ALT: 19 U/L (ref 0–35)
AST: 28 U/L (ref 0–37)
Albumin: 4.8 g/dL (ref 3.5–5.2)
Alkaline Phosphatase: 84 U/L (ref 39–117)
BUN: 10 mg/dL (ref 6–23)
CO2: 26 mEq/L (ref 19–32)
Calcium: 10.4 mg/dL (ref 8.4–10.5)
Chloride: 101 mEq/L (ref 96–112)
Creat: 1.01 mg/dL (ref 0.50–1.10)
GFR, Est African American: 66 mL/min
GFR, Est Non African American: 57 mL/min — ABNORMAL LOW
Glucose, Bld: 98 mg/dL (ref 70–99)
Potassium: 4.7 mEq/L (ref 3.5–5.3)
Sodium: 140 mEq/L (ref 135–145)
Total Bilirubin: 0.4 mg/dL (ref 0.3–1.2)
Total Protein: 7.3 g/dL (ref 6.0–8.3)

## 2011-12-21 LAB — TSH: TSH: 2.421 u[IU]/mL (ref 0.350–4.500)

## 2011-12-21 LAB — VITAMIN D 25 HYDROXY (VIT D DEFICIENCY, FRACTURES): Vit D, 25-Hydroxy: 50 ng/mL (ref 30–89)

## 2011-12-21 LAB — HEMOGLOBIN A1C
Hgb A1c MFr Bld: 5.6 % (ref <5.7)
Mean Plasma Glucose: 114 mg/dL (ref <117)

## 2011-12-21 MED ORDER — ZOLPIDEM TARTRATE 5 MG PO TABS
5.0000 mg | ORAL_TABLET | Freq: Every evening | ORAL | Status: DC | PRN
Start: 2011-12-21 — End: 2011-12-26

## 2011-12-21 NOTE — Telephone Encounter (Signed)
Patient aware of all labs and to repeat her doppler in one year

## 2011-12-21 NOTE — Telephone Encounter (Signed)
Please call patient and let her know that I did call the imaging department. Unfortunately they never received her disc for comparison. Please ask her to go and get a hard copy on a disk herself from [facility] which is where she had a CT done in the prior year (11/29/2010) and bring it to our office. That way we can inter- office mail it to call Hospital radiology Department, attention [place]. Please mark as" make comparison and addendum".

## 2011-12-21 NOTE — Telephone Encounter (Signed)
Patient will pick up Ct disk and drop off to the office and will need to be sent to Radiology per her last note

## 2011-12-21 NOTE — Telephone Encounter (Signed)
Call [place] imaging:  Dennie Bible had CT of chest done  In June. I have asked her to bring in a copy of her previous CT the year before that was sent [facility] to compare the granulomas in her chest soon to determine if she really needed to have a MRI done. She says she called and have them send a copy and even had them put a note on to say that it was for comparison. She says that she never heard back from anyone. Please see if [facility] imaging still has her original CT from [facility] to compare to current CT that was performed in June. Then please have the radiologist do a comparison. If we do not have the film then please let me know so that we can try to call [facility] hospital to have a copy sent to [name].

## 2011-12-23 ENCOUNTER — Ambulatory Visit (HOSPITAL_COMMUNITY)
Admission: RE | Admit: 2011-12-23 | Discharge: 2011-12-23 | Disposition: A | Discharge status: Home / Self Care | Payer: Medicare Other | Source: Ambulatory Visit | Attending: Family Medicine | Admitting: Family Medicine

## 2011-12-23 DIAGNOSIS — Z1231 Encounter for screening mammogram for malignant neoplasm of breast: Secondary | ICD-10-CM | POA: Insufficient documentation

## 2011-12-26 ENCOUNTER — Other Ambulatory Visit: Payer: Self-pay | Admitting: *Deleted

## 2011-12-26 MED ORDER — ZOLPIDEM TARTRATE 5 MG PO TABS
5.0000 mg | ORAL_TABLET | Freq: Every evening | ORAL | Status: DC | PRN
Start: 2011-12-26 — End: 2013-01-17

## 2011-12-27 ENCOUNTER — Other Ambulatory Visit: Payer: Self-pay | Admitting: *Deleted

## 2011-12-27 MED ORDER — SIMVASTATIN 40 MG PO TABS: 20.0000 mg | ORAL_TABLET | Freq: Every day | ORAL | Status: DC

## 2011-12-27 MED ORDER — PAROXETINE HCL 20 MG PO TABS: 20.0000 mg | ORAL_TABLET | ORAL | Status: DC

## 2012-01-12 ENCOUNTER — Ambulatory Visit (HOSPITAL_COMMUNITY): Payer: Medicare Other

## 2012-01-13 ENCOUNTER — Ambulatory Visit (HOSPITAL_COMMUNITY)
Admission: RE | Admit: 2012-01-13 | Discharge: 2012-01-13 | Disposition: A | Discharge status: Home / Self Care | Payer: Medicare Other | Source: Ambulatory Visit | Attending: Family Medicine | Admitting: Family Medicine

## 2012-01-13 DIAGNOSIS — Z780 Asymptomatic menopausal state: Secondary | ICD-10-CM | POA: Insufficient documentation

## 2012-01-13 DIAGNOSIS — Z13820 Encounter for screening for osteoporosis: Secondary | ICD-10-CM | POA: Insufficient documentation

## 2012-01-13 DIAGNOSIS — N951 Menopausal and female climacteric states: Secondary | ICD-10-CM

## 2012-01-19 ENCOUNTER — Telehealth: Payer: Self-pay | Admitting: *Deleted

## 2012-01-19 NOTE — Telephone Encounter (Signed)
Pt calls and wants bone density results- I looked and I can not pull them up. Can you look and see if you can. Also 1 year ago had images of upper chest and pancreas. Seen nodules in right lung- had MRI in June and they needed these notes for comparison- pt states that she called [facility] and they were gonna fax those records over her and pt wanting to know if you got them.

## 2012-01-23 NOTE — Telephone Encounter (Signed)
Bone Density- TScore of -1.3 which is in osteopenia range. Meaning she does not have osteoporosis but is at risk for developing. Will recheck in 2 years. Calcium 1200 mg daily and vitamin d 800IU is recommend.   Please confirm with patient that she needs copies MRI results from [place] in June of this year?

## 2012-01-24 NOTE — Telephone Encounter (Signed)
I do not have any MRI results from [facility]. Need to call them.   See note below about Bone Density.

## 2012-01-24 NOTE — Telephone Encounter (Signed)
Pt notified and will call to get MRI films sent here

## 2012-01-25 ENCOUNTER — Telehealth: Payer: Self-pay | Admitting: Family Medicine

## 2012-01-25 NOTE — Telephone Encounter (Signed)
Please call patient and let her noted to get a copy of her CT. We sent it in intra-office mailed to the radiology department at the hospital with a note for them to review and compare it with the previous. Hopefully we will get an addendum within the next week. If she does not hear from [name] within 10 days then please give [name] a call back.

## 2012-01-26 ENCOUNTER — Telehealth: Payer: Self-pay | Admitting: *Deleted

## 2012-01-26 NOTE — Telephone Encounter (Signed)
See note

## 2012-02-24 ENCOUNTER — Other Ambulatory Visit: Payer: Self-pay | Admitting: Family Medicine

## 2012-03-27 ENCOUNTER — Other Ambulatory Visit: Payer: Self-pay | Admitting: Family Medicine

## 2012-03-27 ENCOUNTER — Encounter: Payer: Self-pay | Admitting: Family Medicine

## 2012-03-27 ENCOUNTER — Ambulatory Visit (INDEPENDENT_AMBULATORY_CARE_PROVIDER_SITE_OTHER): Payer: Medicare Other | Admitting: Family Medicine

## 2012-03-27 VITALS — BP 126/71 | HR 93 | Ht 64.6 in | Wt 191.0 lb

## 2012-03-27 DIAGNOSIS — R002 Palpitations: Secondary | ICD-10-CM

## 2012-03-27 DIAGNOSIS — I10 Essential (primary) hypertension: Secondary | ICD-10-CM

## 2012-03-27 NOTE — Progress Notes (Signed)
Subjective:    Patient ID: Cheryl [name], female    DOB: [birth year], 72 y.o.   MRN: [id number]  HPI Can feel blood pulses into her body, mostly at night about 4 months ago, near when moved. She was under a lot of stress at the time. She closed on her new condo and unfortunately was supposed to also close on her hospital through never happened. This is been very stressful for her sense. Marland Kitchen  Heart pounding/pulse beating is happening daily. Often happens at rest. Last about 30 minutes. No alleivating sxs.  No known triggers.   No heart flutters but some pounding.  No CP or SOB. BPs have been high when it is happening.  Some elevated BPs are not when having episodes.  No fever.  Occ hotflashes.  Has been eating more chocolate.  No fam hx of thyroid problems.  No skin or hair changes.  Poor energy level.  No abdomiain pain.  Has had more frequent HA but thinks coming from her neck.  Had a injection into her neck yesterday and BP bottomed out.  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 No new meds.  Was taking Aleve at beditme for 2 weeks for aches and pain.  No LE swelling.    Review of Systems BP 126/71  Pulse 93  Ht 5' 4.6" (1.641 m)  Wt 191 lb (86.637 kg)  BMI 32.17 kg/m2    Allergies  Allergen Reactions  . Codeine Sulfate     REACTION: pruritis  . Contrast Media (Iodinated Diagnostic Agents) Itching    Multilance.   . Fluoxetine Hcl     REACTION: itching,SOB  . Sulfonamide Derivatives     REACTION: Nausea    Past Medical History  Diagnosis Date  . Constipation   . Arthritis   . Depression   . GERD (gastroesophageal reflux disease)   . Hyperlipidemia   . Osteopenia   . Bronchitis     saw md 10-05-10  . Uterine cancer 1973  . Diverticulosis   . Lung nodules   . Irritable bowel syndrome   . Bulging discs   . Spinal stenosis   . Bone spur   . Sigmoid diverticulitis   . Internal hemorrhoids   . Migraine headache   . Adenomatous colon polyp 2012  . Cyst, kidney, acquired    . Pancreatitis   . Hepatitis 2012    Past Surgical History  Procedure Laterality Date  . Appendectomy  1966  . Vaginal hysterectomy  1973  . Rotator cuff repair  2009    left  . Rectocele repair  2011  . Tonsillectomy    . Cataracts      bilateral  . Eus  02/10/2011    Procedure: UPPER ENDOSCOPIC ULTRASOUND (EUS) RADIAL;  Surgeon: Rob Bunting, MD;  Location: [facility];  Service: Endoscopy;  Laterality: N/A;  radial linear     History   Social History  . Marital Status: Legally Separated    Spouse Name: N/A    Number of Children: 4  . Years of Education: N/A   Occupational History  . Retired      Social History Main Topics  . Smoking status: Former Smoker    Quit date: 02/07/1993  . Smokeless tobacco: Never Used  . Alcohol Use: No  . Drug Use: No  . Sexually Active: Not on file   Other Topics Concern  . Not on file   Social History Narrative   Walking daily.  Family History  Problem Relation Age of Onset  . Alcohol abuse Father   . Lung cancer Father     smoker  . Kidney failure Mother     med induced  . Rheum arthritis Mother   . Depression Brother   . Heart disease      Paternal family   . Heart failure Daughter   . Lung cancer Son   . Crohn's disease Brother   . Alcohol abuse Brother   . HIV Son   . Malignant hyperthermia Neg Hx     Outpatient Encounter Prescriptions as of 03/27/2012  Medication Sig Dispense Refill  . aspirin 81 MG tablet Take 81 mg by mouth daily.        . Cholecalciferol (VITAMIN D) 1000 UNITS capsule Take 1,000 Units by mouth daily.        . fluticasone (FLONASE) 50 MCG/ACT nasal spray       . omeprazole (PRILOSEC) 20 MG capsule TAKE 1 CAPSULE BY MOUTH TWICE DAILY  60 capsule  4  . PARoxetine (PAXIL) 20 MG tablet Take 1 tablet (20 mg total) by mouth every morning.  90 tablet  3  . RESTASIS 0.05 % ophthalmic emulsion       . simvastatin (ZOCOR) 40 MG tablet Take 0.5 tablets (20 mg total) by mouth at bedtime.  90  tablet  1  . traMADol (ULTRAM) 50 MG tablet Take 1 tablet (50 mg total) by mouth 2 (two) times daily as needed for pain.  180 tablet  0  . zolpidem (AMBIEN) 5 MG tablet Take 1 tablet (5 mg total) by mouth at bedtime as needed.  15 tablet  1   No facility-administered encounter medications on file as of 03/27/2012.          Objective:   Physical Exam  Constitutional: She is oriented to person, place, and time. She appears well-developed and well-nourished.  HENT:  Head: Normocephalic and atraumatic.  Right Ear: External ear normal.  Left Ear: External ear normal.  Nose: Nose normal.  Mouth/Throat: Oropharynx is clear and moist.  TMs and canals are clear.   Eyes: Conjunctivae and EOM are normal. Pupils are equal, round, and reactive to light.  Neck: Neck supple. No thyromegaly present.  Cardiovascular: Normal rate, regular rhythm and normal heart sounds.   No carotid bruits.   Pulmonary/Chest: Effort normal and breath sounds normal. She has no wheezes.  Abdominal: Soft. Bowel sounds are normal. She exhibits no distension and no mass. There is no tenderness. There is no rebound and no guarding.  Musculoskeletal: She exhibits no edema.  Lymphadenopathy:    She has no cervical adenopathy.  Neurological: She is alert and oriented to person, place, and time.  Skin: Skin is warm and dry.  Psychiatric: She has a normal mood and affect.          Assessment & Plan:  Heart pounding - unclear etiology. She is certainly concerned about primary aldosteronism. We can check a plasma aldosterone/ring and ratio. Also consider pheochromocytoma as her blood pressure does seem to go up when these episodes occur. She has been tracking them in check and in 3-4 times a day. They will typically run in the 140s to 160s when this occurs. The she did have a couple of blood pressures in the 150s, when she was asymptomatic. I think it's reasonable to evaluate for pheochromocytoma. Her thyroid was normal  about 3 months ago. She's never had any problems with that before. Also consider  that this could be anxiety related. She could be having some form of panic attacks especially since started around the time that her stress significantly increased.  EKG- shows normal sinus rhythm, rate of 90 beats per minute, normal axis. No acute changes.

## 2012-03-27 NOTE — Patient Instructions (Signed)
Can see in [employer] is on your formulary.

## 2012-03-28 LAB — COMPLETE METABOLIC PANEL WITH GFR
ALT: 15 U/L (ref 0–35)
AST: 20 U/L (ref 0–37)
Albumin: 4.4 g/dL (ref 3.5–5.2)
Alkaline Phosphatase: 70 U/L (ref 39–117)
BUN: 11 mg/dL (ref 6–23)
CO2: 26 mEq/L (ref 19–32)
Calcium: 9.9 mg/dL (ref 8.4–10.5)
Chloride: 102 mEq/L (ref 96–112)
Creat: 0.94 mg/dL (ref 0.50–1.10)
GFR, Est African American: 72 mL/min
GFR, Est Non African American: 62 mL/min
Glucose, Bld: 153 mg/dL — ABNORMAL HIGH (ref 70–99)
Potassium: 4.3 mEq/L (ref 3.5–5.3)
Sodium: 136 mEq/L (ref 135–145)
Total Bilirubin: 0.4 mg/dL (ref 0.3–1.2)
Total Protein: 7.1 g/dL (ref 6.0–8.3)

## 2012-03-28 LAB — CBC
HCT: 36.9 % (ref 36.0–46.0)
Hemoglobin: 12.3 g/dL (ref 12.0–15.0)
MCH: 25.7 pg — ABNORMAL LOW (ref 26.0–34.0)
MCHC: 33.3 g/dL (ref 30.0–36.0)
MCV: 77.2 fL — ABNORMAL LOW (ref 78.0–100.0)
Platelets: 268 10*3/uL (ref 150–400)
RBC: 4.78 MIL/uL (ref 3.87–5.11)
RDW: 16.4 % — ABNORMAL HIGH (ref 11.5–15.5)
WBC: 13.4 10*3/uL — ABNORMAL HIGH (ref 4.0–10.5)

## 2012-03-28 LAB — FERRITIN: Ferritin: 10 ng/mL (ref 10–291)

## 2012-03-29 ENCOUNTER — Encounter: Payer: Self-pay | Admitting: *Deleted

## 2012-03-30 LAB — CATECHOLAMINES, FRACTIONATED, PLASMA
Catecholamines, Total: 611 pg/mL
Norepinephrine: 611 pg/mL

## 2012-03-30 LAB — CREATININE CLEARANCE, URINE, 24 HOUR
Creatinine Clearance: 84 mL/min (ref 75–115)
Creatinine, 24H Ur: 1131 mg/d (ref 700–1800)
Creatinine, Urine: 40.4 mg/dL
Creatinine: 0.94 mg/dL (ref 0.50–1.10)

## 2012-03-31 LAB — ALDOSTERONE + RENIN ACTIVITY W/ RATIO
ALDO / PRA Ratio: 1 Ratio (ref 0.9–28.9)
Aldosterone: 4 ng/dL
PRA LC/MS/MS: 4.07 ng/mL/h (ref 0.25–5.82)

## 2012-05-28 ENCOUNTER — Telehealth: Payer: Self-pay | Admitting: *Deleted

## 2012-05-28 ENCOUNTER — Encounter: Payer: Self-pay | Admitting: *Deleted

## 2012-05-28 DIAGNOSIS — D539 Nutritional anemia, unspecified: Secondary | ICD-10-CM

## 2012-05-28 NOTE — Telephone Encounter (Signed)
Lab entered. Kimberly Gordon, LPN  

## 2012-05-29 LAB — CBC
HCT: 39.8 % (ref 36.0–46.0)
Hemoglobin: 13.3 g/dL (ref 12.0–15.0)
MCH: 27 pg (ref 26.0–34.0)
MCHC: 33.4 g/dL (ref 30.0–36.0)
MCV: 80.7 fL (ref 78.0–100.0)
Platelets: 258 10*3/uL (ref 150–400)
RBC: 4.93 MIL/uL (ref 3.87–5.11)
RDW: 17 % — ABNORMAL HIGH (ref 11.5–15.5)
WBC: 6.2 10*3/uL (ref 4.0–10.5)

## 2012-05-29 LAB — FERRITIN: Ferritin: 18 ng/mL (ref 10–291)

## 2012-07-27 ENCOUNTER — Other Ambulatory Visit: Payer: Self-pay | Admitting: Family Medicine

## 2012-07-30 ENCOUNTER — Telehealth: Payer: Self-pay | Admitting: *Deleted

## 2012-07-30 DIAGNOSIS — D539 Nutritional anemia, unspecified: Secondary | ICD-10-CM

## 2012-07-30 NOTE — Telephone Encounter (Signed)
Pt calls to get orders to recheck CBC and Ferritin. Labs entered and faxed to [facility] in [place] [address]  [phone]  ([phone]

## 2012-07-31 LAB — CBC
HCT: 43 % (ref 36.0–46.0)
Hemoglobin: 14.3 g/dL (ref 12.0–15.0)
MCH: 27.7 pg (ref 26.0–34.0)
MCHC: 33.3 g/dL (ref 30.0–36.0)
MCV: 83.2 fL (ref 78.0–100.0)
Platelets: 288 10*3/uL (ref 150–400)
RBC: 5.17 MIL/uL — ABNORMAL HIGH (ref 3.87–5.11)
RDW: 14.9 % (ref 11.5–15.5)
WBC: 5.9 10*3/uL (ref 4.0–10.5)

## 2012-07-31 LAB — FERRITIN: Ferritin: 30 ng/mL (ref 10–291)

## 2012-08-02 ENCOUNTER — Telehealth: Payer: Self-pay | Admitting: *Deleted

## 2012-08-02 MED ORDER — SIMVASTATIN 40 MG PO TABS: 20.0000 mg | ORAL_TABLET | Freq: Every day | ORAL | Status: DC

## 2012-08-02 MED ORDER — FLUTICASONE PROPIONATE 50 MCG/ACT NA SUSP: 2.0000 | Freq: Every day | NASAL | Status: DC

## 2012-08-02 MED ORDER — OMEPRAZOLE 20 MG PO CPDR: DELAYED_RELEASE_CAPSULE | ORAL | Status: DC

## 2012-08-02 MED ORDER — PAROXETINE HCL 20 MG PO TABS: 20.0000 mg | ORAL_TABLET | ORAL | Status: DC

## 2012-08-02 NOTE — Telephone Encounter (Signed)
Pt is now having her rx's filled through [facility] [id number] and would like the following to be sent: Paroxetine,simvastatin,omeprazole,fluticazone.Loralee Pacas [place]

## 2012-09-27 ENCOUNTER — Telehealth: Payer: Self-pay | Admitting: *Deleted

## 2012-09-27 DIAGNOSIS — D539 Nutritional anemia, unspecified: Secondary | ICD-10-CM

## 2012-09-27 NOTE — Telephone Encounter (Signed)
Pt calls and due for repeat lab check of iron. Order entered and faxed to [facility] on [address] In [place] [phone]. [name] Dienes, LPN

## 2012-09-28 LAB — CBC
HCT: 43.3 % (ref 36.0–46.0)
Hemoglobin: 14.3 g/dL (ref 12.0–15.0)
MCH: 28.1 pg (ref 26.0–34.0)
MCHC: 33 g/dL (ref 30.0–36.0)
MCV: 85.1 fL (ref 78.0–100.0)
Platelets: 243 10*3/uL (ref 150–400)
RBC: 5.09 MIL/uL (ref 3.87–5.11)
RDW: 14.8 % (ref 11.5–15.5)
WBC: 5.2 10*3/uL (ref 4.0–10.5)

## 2012-09-28 LAB — FERRITIN: Ferritin: 41 ng/mL (ref 10–291)

## 2012-10-10 ENCOUNTER — Encounter: Payer: Self-pay | Admitting: Physician Assistant

## 2012-10-10 ENCOUNTER — Ambulatory Visit (INDEPENDENT_AMBULATORY_CARE_PROVIDER_SITE_OTHER): Payer: Medicare Other | Admitting: Physician Assistant

## 2012-10-10 VITALS — BP 111/60 | HR 92 | Wt 193.0 lb

## 2012-10-10 DIAGNOSIS — Z23 Encounter for immunization: Secondary | ICD-10-CM

## 2012-10-10 DIAGNOSIS — R21 Rash and other nonspecific skin eruption: Secondary | ICD-10-CM

## 2012-10-10 DIAGNOSIS — L0100 Impetigo, unspecified: Secondary | ICD-10-CM

## 2012-10-10 MED ORDER — DESONIDE 0.05 % EX GEL: Freq: Two times a day (BID) | CUTANEOUS | Status: DC

## 2012-10-10 NOTE — Patient Instructions (Addendum)
Triple antibiotic ointment three times a day.   Impetigo Impetigo is an infection of the skin, most common in babies and children.  CAUSES  It is caused by staphylococcal or streptococcal germs (bacteria). Impetigo can start after any damage to the skin. The damage to the skin may be from things like:   Chickenpox.  Scrapes.  Scratches.  Insect bites (common when children scratch the bite).  Cuts.  Nail biting or chewing. Impetigo is contagious. It can be spread from one person to another. Avoid close skin contact, or sharing towels or clothing. SYMPTOMS  Impetigo usually starts out as small blisters or pustules. Then they turn into tiny yellow-crusted sores (lesions).  There may also be:  Large blisters.  Itching or pain.  Pus.  Swollen lymph glands. With scratching, irritation, or non-treatment, these small areas may get larger. Scratching can cause the germs to get under the fingernails; then scratching another part of the skin can cause the infection to be spread there. DIAGNOSIS  Diagnosis of impetigo is usually made by a physical exam. A skin culture (test to grow bacteria) may be done to prove the diagnosis or to help decide the best treatment.  TREATMENT  Mild impetigo can be treated with prescription antibiotic cream. Oral antibiotic medicine may be used in more severe cases. Medicines for itching may be used. HOME CARE INSTRUCTIONS   To avoid spreading impetigo to other body areas:  Keep fingernails short and clean.  Avoid scratching.  Cover infected areas if necessary to keep from scratching.  Gently wash the infected areas with antibiotic soap and water.  Soak crusted areas in warm soapy water using antibiotic soap.  Gently rub the areas to remove crusts. Do not scrub.  Wash hands often to avoid spread this infection.  Keep children with impetigo home from school or daycare until they have used an antibiotic cream for 48 hours (2 days) or oral  antibiotic medicine for 24 hours (1 day), and their skin shows significant improvement.  Children may attend school or daycare if they only have a few sores and if the sores can be covered by a bandage or clothing. SEEK MEDICAL CARE IF:   More blisters or sores show up despite treatment.  Other family members get sores.  Rash is not improving after 48 hours (2 days) of treatment. SEEK IMMEDIATE MEDICAL CARE IF:   You see spreading redness or swelling of the skin around the sores.  You see red streaks coming from the sores.  Your child develops a fever of 100.4 F (37.2 C) or higher.  Your child develops a sore throat.  Your child is acting ill (lethargic, sick to their stomach). Document Released: 01/22/2000 Document Revised: 04/18/2011 Document Reviewed: 11/21/2007 [facility] Patient Information 2014 [place], [place].

## 2012-10-10 NOTE — Progress Notes (Signed)
  Subjective:    Patient ID: Cheryl [name], female    DOB: 1940-10-16, 72 y.o.   MRN: [id number]  HPI Patient is a 72 year old female who presents to the clinic with a rash around lips for the last 6 weeks. Rash started around the bottom lip and spread along the right side to the upper lip and even around nose. She does not remember having anything like this before. She started using hydrocortisone cream as well as triple antibiotic ointment and does feel like it has helped some. She has not noticed that is oozing or weeping. Rash is very itchy and sometimes sensitive. She denies any fever, chills, nausea, vomiting, runny nose, sinus pressure or ear pain.    Review of Systems     Objective:   Physical Exam  Constitutional: She appears well-developed and well-nourished.  HENT:  Head: Normocephalic and atraumatic.  Skin:  Erythematous pearly papules that appear like dried up vesicles on lower and upper lip, corner of right side of mouth and under both nostrils.           Assessment & Plan:  Rash/impetigo- HPI from pt does sound like impetigo and could be presenting differently on physical exam due to hydrocortisone and triple antibiotic ointment. I would like for patient to continue triple abx ointment three times a day and add desonide twice a day. If not improving should consider oral abx to treat impetigo. Gave handout on symptomatic treatment.    Influenza vaccine given today.  Tetanus vaccine given today.

## 2012-10-11 ENCOUNTER — Other Ambulatory Visit: Payer: Self-pay | Admitting: *Deleted

## 2012-10-11 MED ORDER — DESONIDE 0.05 % EX GEL: Freq: Two times a day (BID) | CUTANEOUS | Status: DC

## 2012-10-15 ENCOUNTER — Other Ambulatory Visit: Payer: Self-pay | Admitting: Physician Assistant

## 2012-10-15 ENCOUNTER — Telehealth: Payer: Self-pay | Admitting: *Deleted

## 2012-10-15 MED ORDER — TRIAMCINOLONE ACETONIDE 0.5 % EX OINT: TOPICAL_OINTMENT | Freq: Two times a day (BID) | CUTANEOUS | Status: DC

## 2012-10-15 NOTE — Telephone Encounter (Signed)
Pt states you sent something for Impetigo and she states it was $95. She is asking if you can send something cheaper.  Meyer Cory, LPN

## 2012-10-15 NOTE — Telephone Encounter (Signed)
Sent triamcinolone. Let me know if not improving.

## 2012-10-15 NOTE — Telephone Encounter (Signed)
Pt informed.  Misty Ahmad, LPN  

## 2012-11-22 ENCOUNTER — Ambulatory Visit (INDEPENDENT_AMBULATORY_CARE_PROVIDER_SITE_OTHER): Payer: Medicare Other | Admitting: Family Medicine

## 2012-11-22 ENCOUNTER — Encounter: Payer: Self-pay | Admitting: Family Medicine

## 2012-11-22 VITALS — BP 109/54 | HR 86 | Wt 194.0 lb

## 2012-11-22 DIAGNOSIS — M25569 Pain in unspecified knee: Secondary | ICD-10-CM

## 2012-11-22 DIAGNOSIS — S83419A Sprain of medial collateral ligament of unspecified knee, initial encounter: Secondary | ICD-10-CM

## 2012-11-22 DIAGNOSIS — M25561 Pain in right knee: Secondary | ICD-10-CM

## 2012-11-22 NOTE — Progress Notes (Signed)
  Subjective:    Patient ID: Cheryl [name], female    DOB: 09-02-1940, 72 y.o.   MRN: [id number]  HPI Right knee pain x3 weeks. She says she's in agreement turned to go get something and felt pain. She's been using Aleve twice a day and icing it. Pain today is 3/10. No swelling. Feels she sprained it. No old surgeries. Worse by the end of the day after being more active.  Can't take a lot of NSAIDs bc of hx of pancreatitis. No other alleviating factors except for rest.    Review of Systems     Objective:   Physical Exam  Constitutional: She is oriented to person, place, and time. She appears well-developed and well-nourished.  HENT:  Head: Normocephalic.  Left Ear: External ear normal.  Musculoskeletal:  Right knee-she does have a small bruise just medial to the patella he is nontender over the patella itself. There is no swelling or edema. Nontender along the joint lines. Negative anterior and posterior for test. No significant discomfort or excess laxity with valgus and varus stress. Negative McMurray's. No significant crepitus with flexion and extension.  Neurological: She is alert and oriented to person, place, and time.  Skin: Skin is warm and dry.  Psychiatric: She has a normal mood and affect. Her behavior is normal.          Assessment & Plan:  Right knee pain-I. do think she has probably strained the medial collateral ligament. I do not seen loss of integrity of any of the other ligaments. No indication for fracture. Recommend x-ray at least today. She's not improving in the next 3 weeks and we might reevaluate. For now continue to ice. Take and ambulatory as able. She does have a history of pancreatitis so she cannot take them routinely. She would benefit greatly from a topical NSAID but unfortunately the cost is prohibitive. We could also consider a course of oral steroids if needed if not improving. We'll continue with conservative treatment. Handout given on specific stretches  to do a home on her own.

## 2012-11-22 NOTE — Patient Instructions (Addendum)
Call if not better in 2-3 weeks.  Can start exercises on the handout.

## 2012-11-29 ENCOUNTER — Other Ambulatory Visit: Payer: Self-pay | Admitting: Family Medicine

## 2012-11-29 DIAGNOSIS — Z1231 Encounter for screening mammogram for malignant neoplasm of breast: Secondary | ICD-10-CM

## 2012-12-20 ENCOUNTER — Ambulatory Visit (INDEPENDENT_AMBULATORY_CARE_PROVIDER_SITE_OTHER): Payer: Medicare Other | Admitting: Family Medicine

## 2012-12-20 ENCOUNTER — Encounter: Payer: Self-pay | Admitting: Family Medicine

## 2012-12-20 ENCOUNTER — Telehealth: Payer: Self-pay | Admitting: *Deleted

## 2012-12-20 VITALS — BP 107/61 | HR 83 | Temp 98.5°F | Wt 194.0 lb

## 2012-12-20 DIAGNOSIS — R911 Solitary pulmonary nodule: Secondary | ICD-10-CM

## 2012-12-20 DIAGNOSIS — Z0000 Encounter for general adult medical examination without abnormal findings: Secondary | ICD-10-CM

## 2012-12-20 DIAGNOSIS — H9190 Unspecified hearing loss, unspecified ear: Secondary | ICD-10-CM

## 2012-12-20 DIAGNOSIS — J984 Other disorders of lung: Secondary | ICD-10-CM

## 2012-12-20 DIAGNOSIS — J8410 Pulmonary fibrosis, unspecified: Secondary | ICD-10-CM

## 2012-12-20 DIAGNOSIS — E781 Pure hyperglyceridemia: Secondary | ICD-10-CM

## 2012-12-20 DIAGNOSIS — R7309 Other abnormal glucose: Secondary | ICD-10-CM

## 2012-12-20 DIAGNOSIS — H9193 Unspecified hearing loss, bilateral: Secondary | ICD-10-CM

## 2012-12-20 LAB — LIPID PANEL
Cholesterol: 209 mg/dL — ABNORMAL HIGH (ref 0–200)
HDL: 49 mg/dL (ref >39)
LDL Cholesterol: 119 mg/dL — ABNORMAL HIGH (ref 0–99)
Total CHOL/HDL Ratio: 4.3 Ratio
Triglycerides: 204 mg/dL — ABNORMAL HIGH (ref <150)
VLDL: 41 mg/dL — ABNORMAL HIGH (ref 0–40)

## 2012-12-20 LAB — CBC WITH DIFFERENTIAL/PLATELET
Basophils Absolute: 0 10*3/uL (ref 0.0–0.1)
Basophils Relative: 0 % (ref 0–1)
Eosinophils Absolute: 0.2 10*3/uL (ref 0.0–0.7)
Eosinophils Relative: 2 % (ref 0–5)
HCT: 42.2 % (ref 36.0–46.0)
Hemoglobin: 14.6 g/dL (ref 12.0–15.0)
Lymphocytes Relative: 40 % (ref 12–46)
Lymphs Abs: 2.8 10*3/uL (ref 0.7–4.0)
MCH: 29.9 pg (ref 26.0–34.0)
MCHC: 34.6 g/dL (ref 30.0–36.0)
MCV: 86.3 fL (ref 78.0–100.0)
Monocytes Absolute: 0.4 10*3/uL (ref 0.1–1.0)
Monocytes Relative: 6 % (ref 3–12)
Neutro Abs: 3.6 10*3/uL (ref 1.7–7.7)
Neutrophils Relative %: 52 % (ref 43–77)
Platelets: 261 10*3/uL (ref 150–400)
RBC: 4.89 MIL/uL (ref 3.87–5.11)
RDW: 14.5 % (ref 11.5–15.5)
WBC: 7 10*3/uL (ref 4.0–10.5)

## 2012-12-20 LAB — COMPLETE METABOLIC PANEL WITH GFR
ALT: 33 U/L (ref 0–35)
AST: 35 U/L (ref 0–37)
Albumin: 4.2 g/dL (ref 3.5–5.2)
Alkaline Phosphatase: 81 U/L (ref 39–117)
BUN: 10 mg/dL (ref 6–23)
CO2: 26 mEq/L (ref 19–32)
Calcium: 10.3 mg/dL (ref 8.4–10.5)
Chloride: 102 mEq/L (ref 96–112)
Creat: 0.98 mg/dL (ref 0.50–1.10)
GFR, Est African American: 68 mL/min
GFR, Est Non African American: 59 mL/min — ABNORMAL LOW
Glucose, Bld: 108 mg/dL — ABNORMAL HIGH (ref 70–99)
Potassium: 4.3 mEq/L (ref 3.5–5.3)
Sodium: 137 mEq/L (ref 135–145)
Total Bilirubin: 0.5 mg/dL (ref 0.3–1.2)
Total Protein: 6.9 g/dL (ref 6.0–8.3)

## 2012-12-20 LAB — HEMOGLOBIN A1C
Hgb A1c MFr Bld: 5.9 % — ABNORMAL HIGH (ref <5.7)
Mean Plasma Glucose: 123 mg/dL — ABNORMAL HIGH (ref <117)

## 2012-12-20 MED ORDER — MUPIROCIN 2 % EX OINT: TOPICAL_OINTMENT | Freq: Two times a day (BID) | CUTANEOUS | Status: DC

## 2012-12-20 NOTE — Progress Notes (Signed)
Subjective:    Cheryl [name] is a 72 y.o. female who presents for Medicare Annual/Subsequent preventive examination.  Preventive Screening-Counseling & Management  Tobacco History  Smoking status  . Former Smoker  . Quit date: 02/07/1993  Smokeless tobacco  . Never Used     Problems Prior to Visit 1. she thinks that time to get another hearing exam. 2. recently had eye exam with Dr. Hazle Quant. Will call to get report. 3.  Current Problems (verified) Patient Active Problem List   Diagnosis Date Noted  . Obesity 03/10/2011  . Pancreatitis 02/10/2011  . Gastritis 02/10/2011  . Dyspepsia 02/10/2011  . Pulmonary nodule 12/10/2010  . PALPITATIONS 02/11/2010  . OSTEOPENIA 11/27/2009  . CONSTIPATION, CHRONIC 08/15/2008  . ONYCHOMYCOSIS 07/08/2008  . INSOMNIA 07/08/2008  . NECK PAIN 04/11/2008  . UNSPECIFIED DISORDER OF KIDNEY AND URETER 01/21/2008  . HEMORRHOIDS, INTERNAL 01/16/2008  . DIVERTICULOSIS, COLON 01/16/2008  . POSTMENOPAUSAL STATUS 12/05/2007  . MICROSCOPIC HEMATURIA 06/05/2007  . PULMONARY NODULE 12/22/2005  . STENOSIS, LUMBAR SPINE 12/22/2005  . ANXIETY 11/15/2005  . IRRITABLE BOWEL SYNDROME 11/15/2005  . ARTHRITIS 11/15/2005    Medications Prior to Visit Current Outpatient Prescriptions on File Prior to Visit  Medication Sig Dispense Refill  . aspirin 81 MG tablet Take 81 mg by mouth daily.        . Cholecalciferol (VITAMIN D) 1000 UNITS capsule Take 1,000 Units by mouth daily.        . fluticasone (FLONASE) 50 MCG/ACT nasal spray Place 2 sprays into the nose daily.  16 g  3  . omeprazole (PRILOSEC) 20 MG capsule TAKE 1 CAPSULE BY MOUTH TWICE DAILY  180 capsule  3  . PARoxetine (PAXIL) 20 MG tablet Take 1 tablet (20 mg total) by mouth every morning.  90 tablet  3  . RESTASIS 0.05 % ophthalmic emulsion       . simvastatin (ZOCOR) 40 MG tablet Take 0.5 tablets (20 mg total) by mouth at bedtime.  90 tablet  3  . traMADol (ULTRAM) 50 MG tablet Take 1 tablet (50  mg total) by mouth 2 (two) times daily as needed for pain.  180 tablet  0  . zolpidem (AMBIEN) 5 MG tablet Take 1 tablet (5 mg total) by mouth at bedtime as needed.  15 tablet  1   No current facility-administered medications on file prior to visit.    Current Medications (verified) Current Outpatient Prescriptions  Medication Sig Dispense Refill  . aspirin 81 MG tablet Take 81 mg by mouth daily.        . Cholecalciferol (VITAMIN D) 1000 UNITS capsule Take 1,000 Units by mouth daily.        . fluticasone (FLONASE) 50 MCG/ACT nasal spray Place 2 sprays into the nose daily.  16 g  3  . omeprazole (PRILOSEC) 20 MG capsule TAKE 1 CAPSULE BY MOUTH TWICE DAILY  180 capsule  3  . PARoxetine (PAXIL) 20 MG tablet Take 1 tablet (20 mg total) by mouth every morning.  90 tablet  3  . RESTASIS 0.05 % ophthalmic emulsion       . simvastatin (ZOCOR) 40 MG tablet Take 0.5 tablets (20 mg total) by mouth at bedtime.  90 tablet  3  . traMADol (ULTRAM) 50 MG tablet Take 1 tablet (50 mg total) by mouth 2 (two) times daily as needed for pain.  180 tablet  0  . zolpidem (AMBIEN) 5 MG tablet Take 1 tablet (5 mg total) by mouth at  bedtime as needed.  15 tablet  1   No current facility-administered medications for this visit.     Allergies (verified) Codeine sulfate; Contrast media; Fluoxetine hcl; and Sulfonamide derivatives   PAST HISTORY  Family History Family History  Problem Relation Age of Onset  . Alcohol abuse Father   . Lung cancer Father     smoker  . Kidney failure Mother     med induced  . Rheum arthritis Mother   . Depression Brother   . Heart disease      Paternal family   . Heart failure Daughter   . Lung cancer Son   . Crohn's disease Brother   . Alcohol abuse Brother   . HIV Son   . Malignant hyperthermia Neg Hx     Social History History  Substance Use Topics  . Smoking status: Former Smoker    Quit date: 02/07/1993  . Smokeless tobacco: Never Used  . Alcohol Use: No      Are there smokers in your home (other than you)? No  Risk Factors Current exercise habits: hasn't been going to the gym lately  Dietary issues discussed: none   Cardiac risk factors: advanced age (older than 74 for men, 77 for women), obesity (BMI >= 30 kg/m2) and sedentary lifestyle.  Depression Screen (Note: if answer to either of the following is "Yes", a more complete depression screening is indicated)   Over the past two weeks, have you felt down, depressed or hopeless? No  Over the past two weeks, have you felt little interest or pleasure in doing things? No  Have you lost interest or pleasure in daily life? No  Do you often feel hopeless? No  Do you cry easily over simple problems? No  Activities of Daily Living In your present state of health, do you have any difficulty performing the following activities?:  Driving? No Managing money?  No Feeding yourself? No Getting from bed to chair? No Climbing a flight of stairs? No Preparing food and eating?: No Bathing or showering? No Getting dressed: No Getting to the toilet? No Using the toilet:No Moving around from place to place: No In the past year have you fallen or had a near fall?:Yes   Are you sexually active?  No  Do you have more than one partner?  No  Hearing Difficulties: Yes Do you often ask people to speak up or repeat themselves? Yes Do you experience ringing or noises in your ears? Yes Do you have difficulty understanding soft or whispered voices? Yes   Do you feel that you have a problem with memory? No  Do you often misplace items? Yes  Do you feel safe at home?  Yes  Cognitive Testing  Alert? Yes  Normal Appearance?Yes  Oriented to person? Yes  Place? Yes   Time? Yes  Recall of three objects?  Yes  Can perform simple calculations? Yes  Displays appropriate judgment?Yes  Can read the correct time from a watch face?Yes   Advanced Directives have been discussed with the patient? Yes  List the  Names of Other Physician/Practitioners you currently use: 1.  Dr. Dorinda Hill Digy 2. Dr. Renae Fickle harking  Indicate any recent Medical Services you may have received from other than [facility] providers in the past year (date may be approximate).  Immunization History  Administered Date(s) Administered  . Influenza Split 12/20/2011  . Influenza Whole 11/08/2004, 11/08/2005, 11/24/2006, 11/12/2007, 12/15/2008, 11/27/2009  . Influenza,inj,Quad PF,36+ Mos 10/10/2012  . Pneumococcal Polysaccharide 02/07/2005,  12/15/2010  . Td 02/07/2002  . Tdap 10/10/2012  . Zoster 11/27/2009    Screening Tests Health Maintenance  Topic Date Due  . Influenza Vaccine  09/07/2013  . Colonoscopy  10/07/2015  . Tetanus/tdap  10/11/2022  . Pneumococcal Polysaccharide Vaccine Age 25 And Over  Completed  . Zostavax  Completed    All answers were reviewed with the patient and necessary referrals were made:  Keryn Nessler, MD   12/20/2012   History reviewed: allergies, current medications, past family history, past medical history, past social history, past surgical history and problem list  Review of Systems A comprehensive review of systems was negative.    Objective:     Vision by Snellen chart: Saw Dr. Hazle Quant this year. Will call to get report   Body mass index is 32.68 kg/(m^2). BP 107/61  Pulse 83  Temp(Src) 98.5 F (36.9 C)  Wt 194 lb (87.998 kg)  BP 107/61  Pulse 83  Temp(Src) 98.5 F (36.9 C)  Wt 194 lb (87.998 kg)  General Appearance:    Alert, cooperative, no distress, appears stated age  Head:    Normocephalic, without obvious abnormality, atraumatic  Eyes:    PERRL, conjunctiva/corneas clear, EOM's intact, both eyes  Ears:    Normal TM's and external ear canals, both ears  Nose:   Nares normal, septum midline, mucosa normal, no drainage    or sinus tenderness  Throat:   Lips, mucosa, and tongue normal; teeth and gums normal  Neck:   Supple, symmetrical, trachea midline, no  adenopathy;    thyroid:  no enlargement/tenderness/nodules; no carotid   bruit or JVD  Back:     Symmetric, no curvature, ROM normal, no CVA tenderness  Lungs:     Clear to auscultation bilaterally, respirations unlabored  Chest Wall:    No tenderness or deformity   Heart:    Regular rate and rhythm, S1 and S2 normal, no murmur, rub   or gallop  Breast Exam:    Not performed.   Abdomen:     Soft, non-tender, bowel sounds active all four quadrants,    no masses, no organomegaly  Genitalia:    Not performed.   Rectal:    Not performed.   Extremities:   Extremities normal, atraumatic, no cyanosis or edema  Pulses:   2+ and symmetric all extremities  Skin:   Skin color, texture, turgor normal, no rashes or lesions  Lymph nodes:   Cervical, supraclavicular nodes normal  Neurologic:   CNII-XII intact, normal strength, sensation and reflexes    throughout       Assessment:     Medicare annual wellness Exam      Plan:     During the course of the visit the patient was educated and counseled about appropriate screening and preventive services including:    vaccines are UTD  Eye exam done  Colon cancer screen UTD  Iron def anemia - ferritin has been coming up very nicely. Encouraged her to continue her supplemental about February.  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 Has been a year. She has not had any symptoms such as shortness of breath cough or fatigue.  Diet review for nutrition referral? Yes ____  Not Indicated _x__   Patient Instructions (the written plan) was given to the patient.  Medicare Attestation I have personally reviewed: The patient's medical and social history Their use of alcohol, tobacco or illicit drugs Their current medications and supplements The patient's functional ability including ADLs,fall risks,  home safety risks, cognitive, and hearing and visual impairment Diet and physical activities Evidence for depression or mood  disorders  The patient's weight, height, BMI, and visual acuity have been recorded in the chart.  I have made referrals, counseling, and provided education to the patient based on review of the above and I have provided the patient with a written personalized care plan for preventive services.     Kae Lauman, MD   12/20/2012

## 2012-12-20 NOTE — Patient Instructions (Signed)
Keep up a regular exercise program and make sure you are eating a healthy diet Try to eat 4 servings of dairy a day, or if you are lactose intolerant take a calcium with vitamin D daily.  Your vaccines are up to date.   

## 2012-12-20 NOTE — Telephone Encounter (Signed)
CT Chest w/o contrast. Auth# [id number].  Meyer Cory, LPN

## 2012-12-24 ENCOUNTER — Ambulatory Visit (HOSPITAL_COMMUNITY)
Admission: RE | Admit: 2012-12-24 | Discharge: 2012-12-24 | Disposition: A | Discharge status: Home / Self Care | Payer: Medicare Other | Source: Ambulatory Visit | Attending: Family Medicine | Admitting: Family Medicine

## 2012-12-24 DIAGNOSIS — Z1231 Encounter for screening mammogram for malignant neoplasm of breast: Secondary | ICD-10-CM | POA: Insufficient documentation

## 2013-01-04 ENCOUNTER — Encounter (HOSPITAL_COMMUNITY): Payer: Self-pay | Admitting: Emergency Medicine

## 2013-01-04 ENCOUNTER — Emergency Department (HOSPITAL_COMMUNITY)
Admission: EM | Admit: 2013-01-04 | Discharge: 2013-01-04 | Disposition: A | Discharge status: Home / Self Care | Payer: Medicare Other | Attending: Emergency Medicine | Admitting: Emergency Medicine

## 2013-01-04 DIAGNOSIS — F329 Major depressive disorder, single episode, unspecified: Secondary | ICD-10-CM | POA: Insufficient documentation

## 2013-01-04 DIAGNOSIS — Z79899 Other long term (current) drug therapy: Secondary | ICD-10-CM | POA: Insufficient documentation

## 2013-01-04 DIAGNOSIS — Z8679 Personal history of other diseases of the circulatory system: Secondary | ICD-10-CM | POA: Insufficient documentation

## 2013-01-04 DIAGNOSIS — M129 Arthropathy, unspecified: Secondary | ICD-10-CM | POA: Insufficient documentation

## 2013-01-04 DIAGNOSIS — Z860100 Personal history of colon polyps, unspecified: Secondary | ICD-10-CM | POA: Insufficient documentation

## 2013-01-04 DIAGNOSIS — N390 Urinary tract infection, site not specified: Secondary | ICD-10-CM

## 2013-01-04 DIAGNOSIS — K219 Gastro-esophageal reflux disease without esophagitis: Secondary | ICD-10-CM | POA: Insufficient documentation

## 2013-01-04 DIAGNOSIS — Z8709 Personal history of other diseases of the respiratory system: Secondary | ICD-10-CM | POA: Insufficient documentation

## 2013-01-04 DIAGNOSIS — Z8542 Personal history of malignant neoplasm of other parts of uterus: Secondary | ICD-10-CM | POA: Insufficient documentation

## 2013-01-04 DIAGNOSIS — M899 Disorder of bone, unspecified: Secondary | ICD-10-CM | POA: Insufficient documentation

## 2013-01-04 DIAGNOSIS — F3289 Other specified depressive episodes: Secondary | ICD-10-CM | POA: Insufficient documentation

## 2013-01-04 DIAGNOSIS — E785 Hyperlipidemia, unspecified: Secondary | ICD-10-CM | POA: Insufficient documentation

## 2013-01-04 DIAGNOSIS — Z8619 Personal history of other infectious and parasitic diseases: Secondary | ICD-10-CM | POA: Insufficient documentation

## 2013-01-04 DIAGNOSIS — IMO0002 Reserved for concepts with insufficient information to code with codable children: Secondary | ICD-10-CM | POA: Insufficient documentation

## 2013-01-04 DIAGNOSIS — Z7982 Long term (current) use of aspirin: Secondary | ICD-10-CM | POA: Insufficient documentation

## 2013-01-04 DIAGNOSIS — Z87891 Personal history of nicotine dependence: Secondary | ICD-10-CM | POA: Insufficient documentation

## 2013-01-04 LAB — URINALYSIS, ROUTINE W REFLEX MICROSCOPIC
Bilirubin Urine: NEGATIVE
Glucose, UA: NEGATIVE mg/dL
Ketones, ur: NEGATIVE mg/dL
Nitrite: NEGATIVE
Protein, ur: 100 mg/dL — AB
Specific Gravity, Urine: 1.018 (ref 1.005–1.030)
Urobilinogen, UA: 0.2 mg/dL (ref 0.0–1.0)
pH: 6 (ref 5.0–8.0)

## 2013-01-04 LAB — URINE MICROSCOPIC-ADD ON

## 2013-01-04 MED ORDER — CEPHALEXIN 500 MG PO CAPS
500.0000 mg | ORAL_CAPSULE | Freq: Once | ORAL | Status: AC
  Administered 2013-01-04: 500 mg via ORAL
  Filled 2013-01-04: qty 1

## 2013-01-04 MED ORDER — CEPHALEXIN 500 MG PO CAPS: 500.0000 mg | ORAL_CAPSULE | Freq: Four times a day (QID) | ORAL | Status: DC

## 2013-01-04 NOTE — ED Notes (Signed)
Pt reports pain with urination, also sts urinary frequency and "feels like I'm not emptying my bladder".

## 2013-01-04 NOTE — ED Provider Notes (Signed)
CSN: [id number]     Arrival date & time 01/04/13  1448 History   First MD Initiated Contact with Patient 01/04/13 1504     No chief complaint on file.  (Consider location/radiation/quality/duration/timing/severity/associated sxs/prior Treatment) HPI  72 year old female with hx of uterine cancer, IBS, diverticulosis presents with dysuria.  Pt report 2 hrs ago while urinate pt experienced a burning sensation with urinating.  Has been having urinary urgency and frequency with discomfort since.  Noticed strong urine odor.  Report 3 weeks ago she experience the same burning sensation while urinating once, but it hasn't return until now.  Denies hx of recurrent UTI.  Denies fever, chills, n/v/d, abd or back pain.  Denies vaginal discharge or rash.  No specific treatment tried.  Had a physical at her doctor's office last week and states "everything was fine".    Past Medical History  Diagnosis Date  . Constipation   . Arthritis   . Depression   . GERD (gastroesophageal reflux disease)   . Hyperlipidemia   . Osteopenia   . Bronchitis     saw md 10-05-10  . Uterine cancer 1973  . Diverticulosis   . Lung nodules   . Irritable bowel syndrome   . Bulging discs   . Spinal stenosis   . Bone spur   . Sigmoid diverticulitis   . Internal hemorrhoids   . Migraine headache   . Adenomatous colon polyp 2012  . Cyst, kidney, acquired   . Pancreatitis   . Hepatitis 2012   Past Surgical History  Procedure Laterality Date  . Appendectomy  1966  . Vaginal hysterectomy  1973  . Rotator cuff repair  2009    left  . Rectocele repair  2011  . Tonsillectomy    . Cataracts      bilateral  . Eus  02/10/2011    Procedure: UPPER ENDOSCOPIC ULTRASOUND (EUS) RADIAL;  Surgeon: Rob Bunting, MD;  Location: [facility];  Service: Endoscopy;  Laterality: N/A;  radial linear    Family History  Problem Relation Age of Onset  . Alcohol abuse Father   . Lung cancer Father     smoker  . Kidney failure Mother      med induced  . Rheum arthritis Mother   . Depression Brother   . Heart disease      Paternal family   . Heart failure Daughter   . Lung cancer Son   . Crohn's disease Brother   . Alcohol abuse Brother   . HIV Son   . Malignant hyperthermia Neg Hx    History  Substance Use Topics  . Smoking status: Former Smoker    Quit date: 02/07/1993  . Smokeless tobacco: Never Used  . Alcohol Use: No   OB History   Grav Para Term Preterm Abortions TAB SAB Ect Mult Living                 Review of Systems  Constitutional: Negative for fever.  Cardiovascular: Negative for chest pain.  Gastrointestinal: Negative for abdominal pain.  Genitourinary: Positive for dysuria, frequency and difficulty urinating. Negative for flank pain and vaginal discharge.  Skin: Negative for rash.  Neurological: Negative for numbness.  All other systems reviewed and are negative.    Allergies  Codeine sulfate; Contrast media; Fluoxetine hcl; and Sulfonamide derivatives  Home Medications   Current Outpatient Rx  Name  Route  Sig  Dispense  Refill  . aspirin 81 MG tablet   Oral  Take 81 mg by mouth daily.           . Cholecalciferol (VITAMIN D) 1000 UNITS capsule   Oral   Take 1,000 Units by mouth daily.           . fluticasone (FLONASE) 50 MCG/ACT nasal spray   Nasal   Place 2 sprays into the nose daily.   16 g   3   . mupirocin ointment (BACTROBAN) 2 %   Topical   Apply topically 2 (two) times daily.   30 g   0   . omeprazole (PRILOSEC) 20 MG capsule      TAKE 1 CAPSULE BY MOUTH TWICE DAILY   180 capsule   3   . PARoxetine (PAXIL) 20 MG tablet   Oral   Take 1 tablet (20 mg total) by mouth every morning.   90 tablet   3   . RESTASIS 0.05 % ophthalmic emulsion   Both Eyes   Place 1 drop into both eyes 2 (two) times daily.          . simvastatin (ZOCOR) 40 MG tablet   Oral   Take 0.5 tablets (20 mg total) by mouth at bedtime.   90 tablet   3   . traMADol (ULTRAM)  50 MG tablet   Oral   Take 1 tablet (50 mg total) by mouth 2 (two) times daily as needed for pain.   180 tablet   0   . zolpidem (AMBIEN) 5 MG tablet   Oral   Take 1 tablet (5 mg total) by mouth at bedtime as needed.   15 tablet   1    There were no vitals taken for this visit. Physical Exam  Nursing note and vitals reviewed. Constitutional: She appears well-developed and well-nourished. No distress.  HENT:  Head: Atraumatic.  Eyes: Conjunctivae are normal.  Neck: Neck supple.  Cardiovascular: Normal rate and regular rhythm.   Pulmonary/Chest: Effort normal and breath sounds normal.  Abdominal: There is no tenderness.  Genitourinary:  No CVA tenderness  Musculoskeletal: She exhibits no edema.  Neurological: She is alert.  Skin: No rash noted.  Psychiatric: She has a normal mood and affect.    ED Course  Procedures (including critical care time)  3:22 PM Well appearing female with dysuria.  No hx of recurrent UTI.  No abdominal pain on exam.  Will check UA.    4:52 PM UA with evidence suggestive of urinary tract infection. Positive hemoglobin in June. Patient has no CVA tenderness to suggest kidney stone however I did offer to obtain a CT scan in [place] to rule out, patient felt CT scan is not necessary at this time. I suggest return to ER if her symptoms worsen. We'll give Keflex and will send urine culture. Patient agrees to follow with her PCP in one week for recheck and to return to ER sooner if symptoms worsen.  Labs Review Labs Reviewed  URINALYSIS, ROUTINE W REFLEX MICROSCOPIC - Abnormal; Notable for the following:    APPearance TURBID (*)    Hgb urine dipstick LARGE (*)    Protein, ur 100 (*)    Leukocytes, UA LARGE (*)    All other components within normal limits  URINE MICROSCOPIC-ADD ON - Abnormal; Notable for the following:    Bacteria, UA MANY (*)    All other components within normal limits  URINE CULTURE   Imaging Review No results found.  EKG  Interpretation   None  MDM   1. UTI (lower urinary tract infection)    BP 127/61  Pulse 84  Temp(Src) 98.8 F (37.1 C)  Resp 18  SpO2 95%     Fayrene Helper, PA-C 01/04/13 1655

## 2013-01-04 NOTE — ED Provider Notes (Signed)
Medical screening examination/treatment/procedure(s) were performed by non-physician practitioner and as supervising physician I was immediately available for consultation/collaboration.  EKG Interpretation   None        Ethelda Chick, MD 01/04/13 1711

## 2013-01-07 LAB — URINE CULTURE: Colony Count: >=100000

## 2013-01-08 ENCOUNTER — Telehealth (HOSPITAL_COMMUNITY): Payer: Self-pay | Admitting: Emergency Medicine

## 2013-01-08 NOTE — ED Notes (Signed)
Post ED Visit - Positive Culture Follow-up  Culture report reviewed by antimicrobial stewardship pharmacist: []  Wes Dulaney, Pharm.D., BCPS [x]  Celedonio Miyamoto, Pharm.D., BCPS []  Georgina Pillion, Pharm.D., BCPS []  [place], [address].D., BCPS, AAHIVP []  Estella Husk, Pharm.D., BCPS, AAHIVP  Positive urine culture Treated with Keflex, organism sensitive to the same and no further patient follow-up is required at this time.  Kylie A Holland 01/08/2013, 1:36 PM

## 2013-01-10 ENCOUNTER — Telehealth: Payer: Self-pay | Admitting: *Deleted

## 2013-01-10 NOTE — Telephone Encounter (Signed)
Recheck UA  5-7 days after complete the antibiotic

## 2013-01-10 NOTE — Telephone Encounter (Signed)
Pt called today & states that she was seen in the ED last Fri for a UTI & they told her to f/u with you.  I asked her if she was still experiencing sx, she said no, and that she had 2 days left on the abx.  I told her I would ask you if you wanted to do a UA recheck.

## 2013-01-10 NOTE — Telephone Encounter (Signed)
Pt.notified

## 2013-01-15 ENCOUNTER — Ambulatory Visit (INDEPENDENT_AMBULATORY_CARE_PROVIDER_SITE_OTHER): Payer: Medicare Other

## 2013-01-15 DIAGNOSIS — J8410 Pulmonary fibrosis, unspecified: Secondary | ICD-10-CM

## 2013-01-15 DIAGNOSIS — R911 Solitary pulmonary nodule: Secondary | ICD-10-CM

## 2013-01-16 ENCOUNTER — Telehealth: Payer: Self-pay | Admitting: *Deleted

## 2013-01-16 ENCOUNTER — Ambulatory Visit (INDEPENDENT_AMBULATORY_CARE_PROVIDER_SITE_OTHER): Payer: Medicare Other | Admitting: Family Medicine

## 2013-01-16 ENCOUNTER — Ambulatory Visit (INDEPENDENT_AMBULATORY_CARE_PROVIDER_SITE_OTHER): Payer: Medicare Other

## 2013-01-16 ENCOUNTER — Encounter: Payer: Self-pay | Admitting: Family Medicine

## 2013-01-16 VITALS — BP 119/61 | HR 86 | Resp 16 | Wt 196.0 lb

## 2013-01-16 DIAGNOSIS — K7689 Other specified diseases of liver: Secondary | ICD-10-CM

## 2013-01-16 DIAGNOSIS — R102 Pelvic and perineal pain: Secondary | ICD-10-CM

## 2013-01-16 DIAGNOSIS — K5730 Diverticulosis of large intestine without perforation or abscess without bleeding: Secondary | ICD-10-CM

## 2013-01-16 DIAGNOSIS — R3911 Hesitancy of micturition: Secondary | ICD-10-CM

## 2013-01-16 DIAGNOSIS — N949 Unspecified condition associated with female genital organs and menstrual cycle: Secondary | ICD-10-CM

## 2013-01-16 DIAGNOSIS — Z9089 Acquired absence of other organs: Secondary | ICD-10-CM

## 2013-01-16 DIAGNOSIS — R319 Hematuria, unspecified: Secondary | ICD-10-CM

## 2013-01-16 DIAGNOSIS — K8020 Calculus of gallbladder without cholecystitis without obstruction: Secondary | ICD-10-CM

## 2013-01-16 DIAGNOSIS — N281 Cyst of kidney, acquired: Secondary | ICD-10-CM

## 2013-01-16 DIAGNOSIS — E7889 Other lipoprotein metabolism disorders: Secondary | ICD-10-CM

## 2013-01-16 LAB — POCT URINALYSIS DIPSTICK
Bilirubin, UA: NEGATIVE
Glucose, UA: NEGATIVE
Ketones, UA: NEGATIVE
Nitrite, UA: NEGATIVE
Protein, UA: NEGATIVE
Spec Grav, UA: 1.01
Urobilinogen, UA: 0.2
pH, UA: 7

## 2013-01-16 NOTE — Telephone Encounter (Signed)
PA obtained for CT ABD/Pelvis w/o contrast. Auth # [id number]. Good thru 03/02/2013.  Meyer Cory, LPN

## 2013-01-16 NOTE — Progress Notes (Signed)
Subjective:    Patient ID: Cheryl [name], female    DOB: [birth year], 72 y.o.   MRN: [id number]  HPI Micah Flesher to the hospital day after Thanks giving. Was having severe pelvic pain that radiates upt to her abdomen, chest and her arms bilaterally.  Says felt like needed ot urinate but couldn't Went to ED.  Did a UA and told had a UTI. Took her antibiotic.  Says pain started again this AM and lasted about an then stared to resolve. No fever or chills. No gross hematuria. She has never had kidney stones.  Burning sensation from the middle of her pelvis up towards her abdomen and her chest and into both arms. No fevers chills or sweats. No other GI symptoms such as change in stools, or nausea or vomiting no worsening or alleviating factors.    Review of Systems BP 119/61  Pulse 86  Resp 16  Wt 196 lb (88.905 kg)  SpO2 98%    Allergies  Allergen Reactions  . Codeine Sulfate     REACTION: pruritis  . Contrast Media [Iodinated Diagnostic Agents] Itching    Multilance.   . Fluoxetine Hcl     REACTION: itching,SOB  . Sulfonamide Derivatives     REACTION: Nausea    Past Medical History  Diagnosis Date  . Constipation   . Arthritis   . Depression   . GERD (gastroesophageal reflux disease)   . Hyperlipidemia   . Osteopenia   . Bronchitis     saw md 10-05-10  . Uterine cancer 1973  . Diverticulosis   . Lung nodules   . Irritable bowel syndrome   . Bulging discs   . Spinal stenosis   . Bone spur   . Sigmoid diverticulitis   . Internal hemorrhoids   . Migraine headache   . Adenomatous colon polyp 2012  . Cyst, kidney, acquired   . Pancreatitis   . Hepatitis 2012    Past Surgical History  Procedure Laterality Date  . Appendectomy  1966  . Vaginal hysterectomy  1973  . Rotator cuff repair  2009    left  . Rectocele repair  2011  . Tonsillectomy    . Cataracts      bilateral  . Eus  02/10/2011    Procedure: UPPER ENDOSCOPIC ULTRASOUND (EUS) RADIAL;  Surgeon: Rob Bunting, MD;   Location: [facility];  Service: Endoscopy;  Laterality: N/A;  radial linear     History   Social History  . Marital Status: Legally Separated    Spouse Name: N/A    Number of Children: 4  . Years of Education: N/A   Occupational History  . Retired      Social History Main Topics  . Smoking status: Former Smoker    Quit date: 02/07/1993  . Smokeless tobacco: Never Used  . Alcohol Use: No  . Drug Use: No  . Sexual Activity: Not on file   Other Topics Concern  . Not on file   Social History Narrative   Walking daily.      Family History  Problem Relation Age of Onset  . Alcohol abuse Father   . Lung cancer Father     smoker  . Kidney failure Mother     med induced  . Rheum arthritis Mother   . Depression Brother   . Heart disease      Paternal family   . Heart failure Daughter   . Lung cancer Son   . Crohn's disease  Brother   . Alcohol abuse Brother   . HIV Son   . Malignant hyperthermia Neg Hx     Outpatient Encounter Prescriptions as of 01/16/2013  Medication Sig  . aspirin 81 MG tablet Take 81 mg by mouth daily.    . Cholecalciferol (VITAMIN D) 1000 UNITS capsule Take 1,000 Units by mouth daily.    . fluticasone (FLONASE) 50 MCG/ACT nasal spray Place 2 sprays into the nose daily.  . mupirocin ointment (BACTROBAN) 2 % Apply topically 2 (two) times daily.  Marland Kitchen omeprazole (PRILOSEC) 20 MG capsule TAKE 1 CAPSULE BY MOUTH TWICE DAILY  . PARoxetine (PAXIL) 20 MG tablet Take 1 tablet (20 mg total) by mouth every morning.  . RESTASIS 0.05 % ophthalmic emulsion Place 1 drop into both eyes 2 (two) times daily.   . simvastatin (ZOCOR) 40 MG tablet Take 0.5 tablets (20 mg total) by mouth at bedtime.  . traMADol (ULTRAM) 50 MG tablet Take 1 tablet (50 mg total) by mouth 2 (two) times daily as needed for pain.  Marland Kitchen zolpidem (AMBIEN) 5 MG tablet Take 1 tablet (5 mg total) by mouth at bedtime as needed.  . [DISCONTINUED] cephALEXin (KEFLEX) 500 MG capsule Take 1 capsule (500  mg total) by mouth 4 (four) times daily.          Objective:   Physical Exam  Constitutional: She is oriented to person, place, and time. She appears well-developed and well-nourished.  HENT:  Head: Normocephalic and atraumatic.  Neck: Neck supple. No thyromegaly present.  Cardiovascular: Normal rate, regular rhythm and normal heart sounds.   Pulmonary/Chest: Effort normal and breath sounds normal.  Musculoskeletal:  No CVA tenderness  Neurological: She is alert and oriented to person, place, and time.  Skin: Skin is warm and dry.  Psychiatric: She has a normal mood and affect. Her behavior is normal.          Assessment & Plan:  Pelvic pain-unclear etiology but still could be a urinary stone. It like to get a CT of the abdomen and pelvis for further evaluation with stone protocol. I think the pain sounds very colicky in nature and she has difficulty urinating what happens. She still has moderate blood in her urine on urinalysis today. As well as some leukocytes. We'll send this for culture. Offered to give her medication for pain. She said that she doesn't think it will be helpful because by the time it starts working she says the pain typically uses off on its own. I think bladder tumor mass is less likely.

## 2013-01-17 ENCOUNTER — Encounter (HOSPITAL_COMMUNITY): Payer: Self-pay | Admitting: Emergency Medicine

## 2013-01-17 ENCOUNTER — Emergency Department (HOSPITAL_COMMUNITY)
Admission: EM | Admit: 2013-01-17 | Discharge: 2013-01-17 | Disposition: A | Discharge status: Home / Self Care | Payer: Medicare Other | Attending: Emergency Medicine | Admitting: Emergency Medicine

## 2013-01-17 ENCOUNTER — Encounter: Payer: Self-pay | Admitting: Family Medicine

## 2013-01-17 DIAGNOSIS — Z7982 Long term (current) use of aspirin: Secondary | ICD-10-CM | POA: Insufficient documentation

## 2013-01-17 DIAGNOSIS — Z860100 Personal history of colon polyps, unspecified: Secondary | ICD-10-CM | POA: Insufficient documentation

## 2013-01-17 DIAGNOSIS — E785 Hyperlipidemia, unspecified: Secondary | ICD-10-CM | POA: Insufficient documentation

## 2013-01-17 DIAGNOSIS — Z87891 Personal history of nicotine dependence: Secondary | ICD-10-CM | POA: Insufficient documentation

## 2013-01-17 DIAGNOSIS — F329 Major depressive disorder, single episode, unspecified: Secondary | ICD-10-CM | POA: Insufficient documentation

## 2013-01-17 DIAGNOSIS — Z792 Long term (current) use of antibiotics: Secondary | ICD-10-CM | POA: Insufficient documentation

## 2013-01-17 DIAGNOSIS — Z8709 Personal history of other diseases of the respiratory system: Secondary | ICD-10-CM | POA: Insufficient documentation

## 2013-01-17 DIAGNOSIS — M545 Low back pain, unspecified: Secondary | ICD-10-CM | POA: Insufficient documentation

## 2013-01-17 DIAGNOSIS — Z8679 Personal history of other diseases of the circulatory system: Secondary | ICD-10-CM | POA: Insufficient documentation

## 2013-01-17 DIAGNOSIS — G8929 Other chronic pain: Secondary | ICD-10-CM | POA: Insufficient documentation

## 2013-01-17 DIAGNOSIS — Z8542 Personal history of malignant neoplasm of other parts of uterus: Secondary | ICD-10-CM | POA: Insufficient documentation

## 2013-01-17 DIAGNOSIS — K219 Gastro-esophageal reflux disease without esophagitis: Secondary | ICD-10-CM | POA: Insufficient documentation

## 2013-01-17 DIAGNOSIS — Z79899 Other long term (current) drug therapy: Secondary | ICD-10-CM | POA: Insufficient documentation

## 2013-01-17 DIAGNOSIS — M129 Arthropathy, unspecified: Secondary | ICD-10-CM | POA: Insufficient documentation

## 2013-01-17 DIAGNOSIS — N390 Urinary tract infection, site not specified: Secondary | ICD-10-CM | POA: Insufficient documentation

## 2013-01-17 DIAGNOSIS — IMO0002 Reserved for concepts with insufficient information to code with codable children: Secondary | ICD-10-CM | POA: Insufficient documentation

## 2013-01-17 DIAGNOSIS — F3289 Other specified depressive episodes: Secondary | ICD-10-CM | POA: Insufficient documentation

## 2013-01-17 LAB — URINE MICROSCOPIC-ADD ON

## 2013-01-17 LAB — URINALYSIS, ROUTINE W REFLEX MICROSCOPIC
Glucose, UA: NEGATIVE mg/dL
Ketones, ur: 15 mg/dL — AB
Nitrite: POSITIVE — AB
Protein, ur: >300 mg/dL — AB
Specific Gravity, Urine: 1.015 (ref 1.005–1.030)
Urobilinogen, UA: 1 mg/dL (ref 0.0–1.0)
pH: 5.5 (ref 5.0–8.0)

## 2013-01-17 MED ORDER — HYDROCODONE-ACETAMINOPHEN 5-325 MG PO TABS: 1.0000 | ORAL_TABLET | ORAL | Status: DC | PRN

## 2013-01-17 MED ORDER — CEPHALEXIN 500 MG PO CAPS: 500.0000 mg | ORAL_CAPSULE | Freq: Two times a day (BID) | ORAL | Status: DC

## 2013-01-17 MED ORDER — HYDROCODONE-ACETAMINOPHEN 5-325 MG PO TABS
1.0000 | ORAL_TABLET | Freq: Once | ORAL | Status: AC
  Administered 2013-01-17: 1 via ORAL
  Filled 2013-01-17: qty 1

## 2013-01-17 NOTE — ED Provider Notes (Signed)
CSN: [id number]     Arrival date & time 01/17/13  2135 History   First MD Initiated Contact with Patient 01/17/13 2151     Chief Complaint  Patient presents with  . Pelvic Pain  . Hematuria   (Consider location/radiation/quality/duration/timing/severity/associated sxs/prior Treatment) HPI Comments: 72 year old female with 2 days of burning with urination and lower abdominal pain. She states that the pain is intermittent and is like a "pinching sensation". The pain does radiate from her pelvis all that to her arms. Has not had any nausea, vomiting, diarrhea, or constipation. She has chronic low back pain and is otherwise not having any new back pain. Denies any fevers. Denies a vaginal bleeding. She has noticed some hematuria. This is similar to when she was seen on 11/28 which is identical UTI and treated with Keflex. She states she finished the Keflex and her symptoms resolved very early on. She saw her PCP yesterday who took a urine sample and is waiting for culture before deciding on antibiotics. They did a CT scan without contrast to look for a stone there is no evidence of a kidney stone or other acute abnormality seen. The patient took one of her hydrocodone today and says her pain is significantly better now.   Past Medical History  Diagnosis Date  . Constipation   . Arthritis   . Depression   . GERD (gastroesophageal reflux disease)   . Hyperlipidemia   . Osteopenia   . Bronchitis     saw md 10-05-10  . Uterine cancer 1973  . Diverticulosis   . Lung nodules   . Irritable bowel syndrome   . Bulging discs   . Spinal stenosis   . Bone spur   . Sigmoid diverticulitis   . Internal hemorrhoids   . Migraine headache   . Adenomatous colon polyp 2012  . Cyst, kidney, acquired   . Pancreatitis   . Hepatitis 2012   Past Surgical History  Procedure Laterality Date  . Appendectomy  1966  . Vaginal hysterectomy  1973  . Rotator cuff repair  2009    left  . Rectocele repair  2011   . Tonsillectomy    . Cataracts      bilateral  . Eus  02/10/2011    Procedure: UPPER ENDOSCOPIC ULTRASOUND (EUS) RADIAL;  Surgeon: Rob Bunting, MD;  Location: [facility];  Service: Endoscopy;  Laterality: N/A;  radial linear    Family History  Problem Relation Age of Onset  . Alcohol abuse Father   . Lung cancer Father     smoker  . Kidney failure Mother     med induced  . Rheum arthritis Mother   . Depression Brother   . Heart disease      Paternal family   . Heart failure Daughter   . Lung cancer Son   . Crohn's disease Brother   . Alcohol abuse Brother   . HIV Son   . Malignant hyperthermia Neg Hx    History  Substance Use Topics  . Smoking status: Former Smoker    Quit date: 02/07/1993  . Smokeless tobacco: Never Used  . Alcohol Use: No   OB History   Grav Para Term Preterm Abortions TAB SAB Ect Mult Living                 Review of Systems  Constitutional: Negative for fever and chills.  Gastrointestinal: Negative for nausea, vomiting and abdominal pain.  Genitourinary: Positive for dysuria and hematuria. Negative for  vaginal bleeding.  Musculoskeletal: Positive for back pain (chronic low back pain, no upper back pain).  All other systems reviewed and are negative.    Allergies  Codeine sulfate; Contrast media; Fluoxetine hcl; and Sulfonamide derivatives  Home Medications   Current Outpatient Rx  Name  Route  Sig  Dispense  Refill  . aspirin 81 MG tablet   Oral   Take 81 mg by mouth daily.           . Cholecalciferol (VITAMIN D) 1000 UNITS capsule   Oral   Take 1,000 Units by mouth daily.           . fluticasone (FLONASE) 50 MCG/ACT nasal spray   Nasal   Place 2 sprays into the nose daily.   16 g   3   . mupirocin ointment (BACTROBAN) 2 %   Topical   Apply topically 2 (two) times daily.   30 g   0   . omeprazole (PRILOSEC) 20 MG capsule      TAKE 1 CAPSULE BY MOUTH TWICE DAILY   180 capsule   3   . PARoxetine (PAXIL) 20 MG  tablet   Oral   Take 1 tablet (20 mg total) by mouth every morning.   90 tablet   3   . RESTASIS 0.05 % ophthalmic emulsion   Both Eyes   Place 1 drop into both eyes 2 (two) times daily.          . simvastatin (ZOCOR) 40 MG tablet   Oral   Take 0.5 tablets (20 mg total) by mouth at bedtime.   90 tablet   3   . traMADol (ULTRAM) 50 MG tablet   Oral   Take 1 tablet (50 mg total) by mouth 2 (two) times daily as needed for pain.   180 tablet   0   . zolpidem (AMBIEN) 5 MG tablet   Oral   Take 1 tablet (5 mg total) by mouth at bedtime as needed.   15 tablet   1    BP 144/62  Pulse 98  Temp(Src) 97.9 F (36.6 C) (Oral)  Resp 20  SpO2 95% Physical Exam  Nursing note and vitals reviewed. Constitutional: She is oriented to person, place, and time. She appears well-developed and well-nourished. No distress.  HENT:  Head: Normocephalic and atraumatic.  Right Ear: External ear normal.  Left Ear: External ear normal.  Nose: Nose normal.  Eyes: Right eye exhibits no discharge. Left eye exhibits no discharge.  Cardiovascular: Normal rate, regular rhythm and normal heart sounds.   Pulmonary/Chest: Effort normal and breath sounds normal.  Abdominal: Soft. She exhibits no distension. There is no tenderness. There is no CVA tenderness.  Neurological: She is alert and oriented to person, place, and time.  Skin: Skin is warm and dry.    ED Course  Procedures (including critical care time) Labs Review Labs Reviewed  URINALYSIS, ROUTINE W REFLEX MICROSCOPIC - Abnormal; Notable for the following:    Color, Urine RED (*)    APPearance TURBID (*)    Hgb urine dipstick LARGE (*)    Bilirubin Urine MODERATE (*)    Ketones, ur 15 (*)    Protein, ur >300 (*)    Nitrite POSITIVE (*)    Leukocytes, UA LARGE (*)    All other components within normal limits  URINE MICROSCOPIC-ADD ON - Abnormal; Notable for the following:    Bacteria, UA MANY (*)    All other components within  normal limits  URINE CULTURE   Imaging Review Ct Abdomen Pelvis Wo Contrast  01/16/2013   CLINICAL DATA:  Pain in lower and bladder radiating upper anterior abdomen. Treatment for UTIs. Microscopic hematuria. Prior appendectomy, hysterectomy and or organ prolapse surgery. History of uterine cancer. Prior appendectomy.  EXAM: CT ABDOMEN AND PELVIS WITHOUT CONTRAST  TECHNIQUE: Multidetector CT imaging of the abdomen and pelvis was performed following the standard protocol without intravenous contrast.  COMPARISON:  07/30/2011 renal MR. 01/15/2013 chest CT. 01/21/2008 abdominal and pelvic CT.  FINDINGS: Lung base pulmonary nodules/calcifications stable since 2009.  Left upper pole 1.9 renal lesion previously measured up to 1.6 cm. Prior MR demonstrated that this had features of a benign hemorrhagic cyst.  No renal or ureteral calculi. No evidence of renal collecting system obstruction.  Decompressed non contrast filled imaging of the urinary bladder unremarkable.  Colonic diverticula most notable sigmoid colon with associated muscular hypertrophy. No extra luminal bowel inflammatory process, free fluid or free air.  Post hysterectomy.  Elongated right external iliac lymph node with short axis dimension of less than 1 cm.  Fatty infiltration of the liver. Taking into account limitation by non contrast imaging, no worrisome hepatic, pancreatic, splenic or adrenal lesion. Calcified granulomas within the spleen. Calcified lymph node adjacent to the lower esophagus. Findings in addition to left base granulomas and calcified left hilar lymph node consistent with prior granulomatous exposure.  5 mm calcified gallstone.  Partial calcification of the aorta without aneurysmal dilation. Calcified iliac arteries without dilation.  No bony destructive lesion. Degenerative changes lumbar spine with mild spinal stenosis L4-5 level.  IMPRESSION: Left upper pole 1.9 renal lesion previously measured up to 1.6 cm. Prior MR  demonstrated that this had features of a benign hemorrhagic cyst.  No renal or ureteral calculi. No evidence of renal collecting system obstruction.  Decompressed non contrast filled imaging of the urinary bladder unremarkable.  Colonic diverticula most notable sigmoid colon with associated muscular hypertrophy. No extra luminal bowel inflammatory process, free fluid or free air.  Post hysterectomy and appendectomy.  Fatty infiltration of the liver.  Calcified granulomas within the spleen. Calcified lymph node adjacent to the lower esophagus. Findings in addition to left base granulomas and calcified left hilar lymph node consistent with prior granulomatous exposure.  5 mm calcified gallstone.   Electronically Signed   By: Bridgett Larsson M.D.   On: 01/16/2013 14:24    EKG Interpretation   None       MDM   1. UTI (urinary tract infection)    Her exam is benign, patient is comfortable. No CVA tenderness, vomiting or fevers to suggest pyelo. Urine positive for UTI. As she resolved with keflex before and has sulfa allergy, will treat with keflex and watch cultures. Patient will call PCP tomorrow with update and check their cultures.     Audree Camel, MD 01/18/13 [phone]

## 2013-01-17 NOTE — ED Notes (Signed)
Pt states she is having pain in her pelvic area and blood in her urine  Pt states the sxs started on the 8th  Pt states she went to see her dr on the 10th  Pt had a CT yesterday that did not show anything   Pt states she was not started on antibiotics because they wanted to culture her urine first

## 2013-01-18 ENCOUNTER — Telehealth: Payer: Self-pay | Admitting: *Deleted

## 2013-01-18 NOTE — Telephone Encounter (Signed)
Pt called and stated that she went to [facility] last night and does have a UTI she has e.coli in her urine and was given keflex and hydrocodone for this. I told her to finish the abx and if she notices that she is not feeling any better she should call and make an appt to be seen otherwise, she should f/u one week after completion of abx . She voiced understanding and agreed

## 2013-01-19 LAB — URINE CULTURE
Colony Count: >=100000
Colony Count: >=100000

## 2013-01-20 NOTE — ED Notes (Signed)
Post ED Visit - Positive Culture Follow-up  Culture report reviewed by antimicrobial stewardship pharmacist: []  Wes Dulaney, Pharm.D., BCPS []  Celedonio Miyamoto, [address].D., BCPS []  Georgina Pillion, Pharm.D., BCPS []  [place], [place].D., BCPS, AAHIVP []  Estella Husk, Pharm.D., BCPS, AAHIVP [x]  Louie Casa, [address].D., BCPS  Positive urine culture Treated with Keflex, organism sensitive to the same and no further patient follow-up is required at this time.  Kylie A Holland 01/20/2013, 4:16 PM

## 2013-01-28 ENCOUNTER — Telehealth: Payer: Self-pay | Admitting: *Deleted

## 2013-01-28 ENCOUNTER — Other Ambulatory Visit: Payer: Self-pay | Admitting: Physician Assistant

## 2013-01-28 MED ORDER — HYDROCODONE-HOMATROPINE 5-1.5 MG/5ML PO SYRP: 5.0000 mL | ORAL_SOLUTION | Freq: Every evening | ORAL | Status: DC | PRN

## 2013-01-28 NOTE — Telephone Encounter (Signed)
Pt notified to come by office and pick up prescription. [name] Dienes, LPN

## 2013-01-28 NOTE — Telephone Encounter (Signed)
Ok I printed off come in to pick up.

## 2013-01-28 NOTE — Telephone Encounter (Signed)
Pt calls and states that her family has all had upper respiratory infections and now she has a cough especially at night with congestion.  She wants to know if she could get some cough med sent to her pharmacy.  States its the one [facility] would normally prescribe to her. [name] Dienes, LPN

## 2013-02-27 ENCOUNTER — Other Ambulatory Visit: Payer: Self-pay | Admitting: *Deleted

## 2013-02-27 MED ORDER — FLUTICASONE PROPIONATE 50 MCG/ACT NA SUSP: 2.0000 | Freq: Every day | NASAL | Status: DC

## 2013-02-27 MED ORDER — OMEPRAZOLE 20 MG PO CPDR: 20.0000 mg | DELAYED_RELEASE_CAPSULE | Freq: Two times a day (BID) | ORAL | Status: DC

## 2013-02-27 MED ORDER — PAROXETINE HCL 20 MG PO TABS: 20.0000 mg | ORAL_TABLET | ORAL | Status: DC

## 2013-03-04 ENCOUNTER — Other Ambulatory Visit: Payer: Self-pay | Admitting: *Deleted

## 2013-03-04 MED ORDER — TRAMADOL HCL 50 MG PO TABS: 50.0000 mg | ORAL_TABLET | Freq: Two times a day (BID) | ORAL | Status: DC | PRN

## 2013-03-05 ENCOUNTER — Encounter: Payer: Self-pay | Admitting: Family Medicine

## 2013-03-11 ENCOUNTER — Encounter: Payer: Self-pay | Admitting: Family Medicine

## 2013-03-11 ENCOUNTER — Ambulatory Visit (INDEPENDENT_AMBULATORY_CARE_PROVIDER_SITE_OTHER): Payer: Medicare HMO | Admitting: Family Medicine

## 2013-03-11 VITALS — BP 115/62 | HR 82 | Temp 97.5°F | Ht 64.6 in | Wt 193.0 lb

## 2013-03-11 DIAGNOSIS — L659 Nonscarring hair loss, unspecified: Secondary | ICD-10-CM

## 2013-03-11 DIAGNOSIS — R5381 Other malaise: Secondary | ICD-10-CM

## 2013-03-11 DIAGNOSIS — R5383 Other fatigue: Secondary | ICD-10-CM

## 2013-03-11 NOTE — Progress Notes (Signed)
Subjective:    Patient ID: Cheryl [name], female    DOB: August 09, 1940, 73 y.o.   MRN: [id number]  HPI diffuse hair loss for last 3 months.  Says Has had to cover spots lately. No itchey scalp or burning sensations.  No crusts or scabe. Father has androgenic alopecia.  No prior hx of hair loss. Has been very fatigued over the last 3 weeks. Has felt more SOB since had the flu over christmas ( 1 month ago). Says used ot have more body to her hair and now it is more thin.      Review of Systems BP 115/62  Pulse 82  Temp(Src) 97.5 F (36.4 C)  Ht 5' 4.6" (1.641 m)  Wt 193 lb (87.544 kg)  BMI 32.51 kg/m2  SpO2 97%    Allergies  Allergen Reactions  . Codeine Sulfate     REACTION: pruritis  . Contrast Media [Iodinated Diagnostic Agents] Itching    Multilance.   . Fluoxetine Hcl     REACTION: itching,SOB  . Sulfonamide Derivatives     REACTION: Nausea    Past Medical History  Diagnosis Date  . Constipation   . Arthritis   . Depression   . GERD (gastroesophageal reflux disease)   . Hyperlipidemia   . Osteopenia   . Bronchitis     saw md 10-05-10  . Uterine cancer 1973  . Diverticulosis   . Lung nodules   . Irritable bowel syndrome   . Bulging discs   . Spinal stenosis   . Bone spur   . Sigmoid diverticulitis   . Internal hemorrhoids   . Migraine headache   . Adenomatous colon polyp 2012  . Cyst, kidney, acquired   . Pancreatitis   . Hepatitis 2012    Past Surgical History  Procedure Laterality Date  . Appendectomy  1966  . Vaginal hysterectomy  1973  . Rotator cuff repair  2009    left  . Rectocele repair  2011  . Tonsillectomy    . Cataracts      bilateral  . Eus  02/10/2011    Procedure: UPPER ENDOSCOPIC ULTRASOUND (EUS) RADIAL;  Surgeon: Owens Loffler, MD;  Location: [facility];  Service: Endoscopy;  Laterality: N/A;  radial linear     History   Social History  . Marital Status: Legally Separated    Spouse Name: N/A    Number of Children: 4  .  Years of Education: N/A   Occupational History  . Retired      Social History Main Topics  . Smoking status: Former Smoker    Quit date: 02/07/1993  . Smokeless tobacco: Never Used  . Alcohol Use: No  . Drug Use: No  . Sexual Activity: Not on file   Other Topics Concern  . Not on file   Social History Narrative   Walking daily.      Family History  Problem Relation Age of Onset  . Alcohol abuse Father   . Lung cancer Father     smoker  . Kidney failure Mother     med induced  . Rheum arthritis Mother   . Depression Brother   . Heart disease      Paternal family   . Heart failure Daughter   . Lung cancer Son   . Crohn's disease Brother   . Alcohol abuse Brother   . HIV Son   . Malignant hyperthermia Neg Hx     Outpatient Encounter Prescriptions as of 03/11/2013  Medication Sig  . aspirin 81 MG tablet Take 81 mg by mouth daily.    . Cholecalciferol (VITAMIN D) 1000 UNITS capsule Take 1,000 Units by mouth daily.    . fluticasone (FLONASE) 50 MCG/ACT nasal spray Place 2 sprays into both nostrils daily. Please dispense 3 month supply  . HYDROcodone-acetaminophen (NORCO) 5-325 MG per tablet Take 1 tablet by mouth every 4 (four) hours as needed.  Marland Kitchen omeprazole (PRILOSEC) 20 MG capsule Take 1 capsule (20 mg total) by mouth 2 (two) times daily before a meal.  . PARoxetine (PAXIL) 20 MG tablet Take 1 tablet (20 mg total) by mouth every morning.  Marland Kitchen Phenazopyridine HCl (AZO STANDARD MAXIMUM STRENGTH PO) Take 1 tablet by mouth daily as needed (pain/burning).  . RESTASIS 0.05 % ophthalmic emulsion Place 1 drop into both eyes 2 (two) times daily.   . simvastatin (ZOCOR) 40 MG tablet Take 0.5 tablets (20 mg total) by mouth at bedtime.  . traMADol (ULTRAM) 50 MG tablet Take 1 tablet (50 mg total) by mouth 2 (two) times daily as needed.  . zolpidem (AMBIEN) 5 MG tablet Take 5 mg by mouth at bedtime as needed for sleep.  . [DISCONTINUED] cephALEXin (KEFLEX) 500 MG capsule Take 1 capsule  (500 mg total) by mouth 2 (two) times daily.  . [DISCONTINUED] HYDROCODONE-ACETAMINOPHEN PO Take 1 tablet by mouth once.  . [DISCONTINUED] HYDROcodone-homatropine (HYCODAN) 5-1.5 MG/5ML syrup Take 5 mLs by mouth at bedtime as needed for cough.          Objective:   Physical Exam  Constitutional: She appears well-developed and well-nourished.  HENT:  Head: Normocephalic and atraumatic.  Skin: Skin is warm and dry.  Psychiatric: She has a normal mood and affect. Her behavior is normal.   She does have a little bit more thinning of hair on the top of her head. There are a few areas where there some loss of hair follicles. No distinct circular areas of hair follicle absence. Her dose feels smooth and soft. She has a little bit of dryness of the scalp on the top of her head but the rest of her scalp looks fantastic. No erythema no lesions.       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 will check blood work first rule out anemia and thyroid since she's also had significant fatigue since she started noticing hair loss. If these are normal then we can discuss treatment options.  We can consider treatment with oral fast ride or topical minoxidil.

## 2013-03-12 LAB — CBC WITH DIFFERENTIAL/PLATELET
Basophils Absolute: 0 10*3/uL (ref 0.0–0.1)
Basophils Relative: 0 % (ref 0–1)
Eosinophils Absolute: 0.1 10*3/uL (ref 0.0–0.7)
Eosinophils Relative: 2 % (ref 0–5)
HCT: 41.5 % (ref 36.0–46.0)
Hemoglobin: 14 g/dL (ref 12.0–15.0)
Lymphocytes Relative: 41 % (ref 12–46)
Lymphs Abs: 2.4 10*3/uL (ref 0.7–4.0)
MCH: 28.4 pg (ref 26.0–34.0)
MCHC: 33.7 g/dL (ref 30.0–36.0)
MCV: 84.2 fL (ref 78.0–100.0)
Monocytes Absolute: 0.5 10*3/uL (ref 0.1–1.0)
Monocytes Relative: 9 % (ref 3–12)
Neutro Abs: 2.9 10*3/uL (ref 1.7–7.7)
Neutrophils Relative %: 48 % (ref 43–77)
Platelets: 262 10*3/uL (ref 150–400)
RBC: 4.93 MIL/uL (ref 3.87–5.11)
RDW: 14.1 % (ref 11.5–15.5)
WBC: 5.9 10*3/uL (ref 4.0–10.5)

## 2013-03-12 LAB — FERRITIN: Ferritin: 72 ng/mL (ref 10–291)

## 2013-03-12 LAB — COMPLETE METABOLIC PANEL WITH GFR
ALT: 22 U/L (ref 0–35)
AST: 24 U/L (ref 0–37)
Albumin: 4.1 g/dL (ref 3.5–5.2)
Alkaline Phosphatase: 68 U/L (ref 39–117)
BUN: 12 mg/dL (ref 6–23)
CO2: 30 mEq/L (ref 19–32)
Calcium: 10 mg/dL (ref 8.4–10.5)
Chloride: 103 mEq/L (ref 96–112)
Creat: 0.81 mg/dL (ref 0.50–1.10)
GFR, Est African American: 85 mL/min
GFR, Est Non African American: 74 mL/min
Glucose, Bld: 90 mg/dL (ref 70–99)
Potassium: 4.5 mEq/L (ref 3.5–5.3)
Sodium: 141 mEq/L (ref 135–145)
Total Bilirubin: 0.4 mg/dL (ref 0.2–1.2)
Total Protein: 6.5 g/dL (ref 6.0–8.3)

## 2013-03-12 LAB — VITAMIN B12: Vitamin B-12: 642 pg/mL (ref 211–911)

## 2013-03-12 LAB — TSH: TSH: 2.25 u[IU]/mL (ref 0.350–4.500)

## 2013-04-05 ENCOUNTER — Other Ambulatory Visit: Payer: Self-pay | Admitting: Physician Assistant

## 2013-04-05 ENCOUNTER — Encounter: Payer: Self-pay | Admitting: Family Medicine

## 2013-04-08 ENCOUNTER — Other Ambulatory Visit: Payer: Self-pay | Admitting: Physician Assistant

## 2013-04-08 MED ORDER — ALPRAZOLAM 0.5 MG PO TABS: 0.5000 mg | ORAL_TABLET | Freq: Every evening | ORAL | Status: DC | PRN

## 2013-04-18 ENCOUNTER — Encounter: Payer: Self-pay | Admitting: Family Medicine

## 2013-04-18 ENCOUNTER — Telehealth: Payer: Self-pay | Admitting: *Deleted

## 2013-04-18 ENCOUNTER — Ambulatory Visit (INDEPENDENT_AMBULATORY_CARE_PROVIDER_SITE_OTHER): Payer: Medicare HMO | Admitting: Family Medicine

## 2013-04-18 VITALS — BP 110/60 | HR 84 | Temp 98.3°F | Wt 192.0 lb

## 2013-04-18 DIAGNOSIS — M542 Cervicalgia: Secondary | ICD-10-CM

## 2013-04-18 DIAGNOSIS — H1089 Other conjunctivitis: Secondary | ICD-10-CM

## 2013-04-18 DIAGNOSIS — H6980 Other specified disorders of Eustachian tube, unspecified ear: Secondary | ICD-10-CM

## 2013-04-18 DIAGNOSIS — L719 Rosacea, unspecified: Secondary | ICD-10-CM

## 2013-04-18 DIAGNOSIS — B9689 Other specified bacterial agents as the cause of diseases classified elsewhere: Secondary | ICD-10-CM

## 2013-04-18 DIAGNOSIS — H109 Unspecified conjunctivitis: Secondary | ICD-10-CM

## 2013-04-18 DIAGNOSIS — A499 Bacterial infection, unspecified: Secondary | ICD-10-CM

## 2013-04-18 MED ORDER — METRONIDAZOLE 0.75 % EX GEL: 1.0000 "application " | Freq: Two times a day (BID) | CUTANEOUS | Status: DC

## 2013-04-18 MED ORDER — POLYMYXIN B-TRIMETHOPRIM 10000-0.1 UNIT/ML-% OP SOLN
2.0000 [drp] | OPHTHALMIC | Status: DC
Start: 2013-04-18 — End: 2013-05-14

## 2013-04-18 NOTE — Telephone Encounter (Signed)
Ok to place referral and fax.

## 2013-04-18 NOTE — Telephone Encounter (Signed)
Pt would like a referral to [facility] [phone] ext [place]. She wants to get an injection in her neck. She would like to see Dr. Urbano Heir for an am appt.

## 2013-04-18 NOTE — Progress Notes (Signed)
CC: Cheryl [name] is a 73 y.o. female is here for eye irritation and break out around the nose   Subjective: HPI:  Complains of redness and burning beneath both nostrils that has been present to a mild degree At least until August of 2014. She has tried Bactroban with a mild improvement however caused intolerable stinging, she's also tried triamcinolone cream which did not provide any benefit whatsoever. Symptoms are worse to touch nothing particularly makes it better or worse other than above. It does not fluctuate throughout the day. Has not been getting better or worse over the past month. Denies nasal congestion or discharge. Redness used to expand almost in a mustache distribution however that has resolved. She does report that she has frequent intermittent flushing in both cheeks described as wearing too much makeup  Complains of bilateral eye irritation has been present for 2-3 weeks. Has not been getting better or worse since onset. Mild to moderate in severity. She describes it as rocks on her eyelids. She denies vision loss nor intraocular pain. Denies discharge. She suffers from chronic eye dryness but this feels much different. Nothing particularly makes symptoms better or worse there persisted throughout the day.  Complains of bilateral ear pain that has been present since January described as pressure that comes and goes throughout the day left greater than right. Denies hearing loss, discharge, nor sinus pressure.  Denies any recent fevers, chills, motor or sensory disturbances nor respiratory complaints other than that described above    Review Of Systems Outlined In HPI  Past Medical History  Diagnosis Date  . Constipation   . Arthritis   . Depression   . GERD (gastroesophageal reflux disease)   . Hyperlipidemia   . Osteopenia   . Bronchitis     saw md 10-05-10  . Uterine cancer 1973  . Diverticulosis   . Lung nodules   . Irritable bowel syndrome   . Bulging discs   .  Spinal stenosis   . Bone spur   . Sigmoid diverticulitis   . Internal hemorrhoids   . Migraine headache   . Adenomatous colon polyp 2012  . Cyst, kidney, acquired   . Pancreatitis   . Hepatitis 2012    Past Surgical History  Procedure Laterality Date  . Appendectomy  1966  . Vaginal hysterectomy  1973  . Rotator cuff repair  2009    left  . Rectocele repair  2011  . Tonsillectomy    . Cataracts      bilateral  . Eus  02/10/2011    Procedure: UPPER ENDOSCOPIC ULTRASOUND (EUS) RADIAL;  Surgeon: Owens Loffler, MD;  Location: [facility];  Service: Endoscopy;  Laterality: N/A;  radial linear    Family History  Problem Relation Age of Onset  . Alcohol abuse Father   . Lung cancer Father     smoker  . Kidney failure Mother     med induced  . Rheum arthritis Mother   . Depression Brother   . Heart disease      Paternal family   . Heart failure Daughter   . Lung cancer Son   . Crohn's disease Brother   . Alcohol abuse Brother   . HIV Son   . Malignant hyperthermia Neg Hx     History   Social History  . Marital Status: Legally Separated    Spouse Name: N/A    Number of Children: 4  . Years of Education: N/A   Occupational History  .  Retired      Social History Main Topics  . Smoking status: Former Smoker    Quit date: 02/07/1993  . Smokeless tobacco: Never Used  . Alcohol Use: No  . Drug Use: No  . Sexual Activity: Not on file   Other Topics Concern  . Not on file   Social History Narrative   Walking daily.       Objective: BP 110/60  Pulse 84  Temp(Src) 98.3 F (36.8 C) (Oral)  Wt 192 lb (87.091 kg)  General: Alert and Oriented, No Acute Distress HEENT: Pupils equal, round, reactive to light. Conjunctivae with mild peripheral injection bilaterally sparing the limbus, anterior chamber bilaterally is unremarkable appears clear and open, eye lids and lashes are unremarkable bilaterally.  External ears unremarkable, canals clear with intact TMs with  appropriate landmarks.  Middle ear appears open without effusion. Pink inferior turbinates, just inferior and lateral to both nares there is mild to moderate erythema slightly tender to touch.  Moist mucous membranes, pharynx without inflammation nor lesions.  Neck supple without palpable lymphadenopathy nor abnormal masses. Lungs:  clear comfortable work of breathing  Cardiac: Regular rate and rhythm.  Mental Status: No depression, anxiety, nor agitation. Skin: Warm and dry.  Assessment & Plan: Cheryl [name] was seen today for eye irritation and break out around the nose.  Diagnoses and associated orders for this visit:  Rosacea - metroNIDAZOLE (METROGEL) 0.75 % gel; Apply 1 application topically 2 (two) times daily.  Bacterial conjunctivitis - trimethoprim-polymyxin b (POLYTRIM) ophthalmic solution; Place 2 drops into both eyes every 4 (four) hours.  Eustachian tube dysfunction    Bacterial conjunctivitis: Start Polytrim if not improving by next week return to her ophthalmologist for slit-lamp examination Rosacea: The distribution of her rash is most likely due to impetigo however he stopped it does not completely resolve with Bactroban and has been static for the past weeks. Will try metronidazole since rosacea remains in the differential especially with her eye discomfort. If not improved in one week refer to dermatology Eustachian tube dysfunction: Discussed she take decongestants over-the-counter to help with opening her eustachian tube which is most likely causing pressure discomfort in both ears  Return if symptoms worsen or fail to improve.

## 2013-04-22 NOTE — Addendum Note (Signed)
Addended by: Marcial Pacas on: 04/22/2013 12:43 PM   Modules accepted: Orders

## 2013-04-22 NOTE — Progress Notes (Signed)
After the weekend patient has requested referral to ophthalmology please see her e-mail in the EMR

## 2013-05-07 ENCOUNTER — Encounter: Payer: Self-pay | Admitting: Family Medicine

## 2013-05-09 ENCOUNTER — Other Ambulatory Visit: Payer: Self-pay | Admitting: Family Medicine

## 2013-05-09 MED ORDER — METRONIDAZOLE 0.75 % EX CREA: TOPICAL_CREAM | Freq: Two times a day (BID) | CUTANEOUS | Status: DC

## 2013-05-11 ENCOUNTER — Encounter: Payer: Self-pay | Admitting: Family Medicine

## 2013-05-14 ENCOUNTER — Encounter: Payer: Self-pay | Admitting: Family Medicine

## 2013-05-14 ENCOUNTER — Ambulatory Visit (INDEPENDENT_AMBULATORY_CARE_PROVIDER_SITE_OTHER): Payer: Medicare HMO

## 2013-05-14 ENCOUNTER — Ambulatory Visit (INDEPENDENT_AMBULATORY_CARE_PROVIDER_SITE_OTHER): Payer: Commercial Managed Care - HMO | Admitting: Family Medicine

## 2013-05-14 ENCOUNTER — Other Ambulatory Visit: Payer: Self-pay | Admitting: Family Medicine

## 2013-05-14 VITALS — BP 121/64 | HR 106 | Temp 98.7°F | Wt 189.0 lb

## 2013-05-14 DIAGNOSIS — R5381 Other malaise: Secondary | ICD-10-CM | POA: Diagnosis not present

## 2013-05-14 DIAGNOSIS — J3489 Other specified disorders of nose and nasal sinuses: Secondary | ICD-10-CM

## 2013-05-14 DIAGNOSIS — R0602 Shortness of breath: Secondary | ICD-10-CM | POA: Diagnosis not present

## 2013-05-14 DIAGNOSIS — R9389 Abnormal findings on diagnostic imaging of other specified body structures: Secondary | ICD-10-CM

## 2013-05-14 DIAGNOSIS — R5383 Other fatigue: Principal | ICD-10-CM

## 2013-05-14 DIAGNOSIS — Z87891 Personal history of nicotine dependence: Secondary | ICD-10-CM

## 2013-05-14 NOTE — Progress Notes (Signed)
   Subjective:    Patient ID: Cheryl [name], female    DOB: 10-10-1940, 73 y.o.   MRN: [id number]  HPI Still has some post nasal drip, some naasal pressure, and pressure in the left ear.  Says has been more SOB when singing at church since had the flu, a couple months ago..   Temps have been a little low. Has felt really fatigued. Still thinks it could be her thyroid. No other lumps or bumps.  She wonders if she can actually have chronic fatigue syndrome at this point. She just constantly feels exhausted. She has been using her Flonase consistently. No fevers chills or sweats but has had some low temperatures which she brought in, that she has written down. She has a little bit of sinus pressure in her face. She says this doesn't feel like her usual sinus infection.   Review of Systems     Objective:   Physical Exam  Constitutional: She is oriented to person, place, and time. She appears well-developed and well-nourished.  HENT:  Head: Normocephalic and atraumatic.  Right Ear: External ear normal.  Left Ear: External ear normal.  Nose: Nose normal.  Mouth/Throat: Oropharynx is clear and moist.  TMs and canals are clear.   Eyes: Conjunctivae and EOM are normal. Pupils are equal, round, and reactive to light.  Neck: Neck supple. No thyromegaly present.  Cardiovascular: Normal rate, regular rhythm and normal heart sounds.   Pulmonary/Chest: Effort normal and breath sounds normal. She has no wheezes.  Lymphadenopathy:    She has no cervical adenopathy.  Neurological: She is alert and oriented to person, place, and time.  Skin: Skin is warm and dry.  Psychiatric: She has a normal mood and affect.          Assessment & Plan:  Fatigue- Unclear etiology at this point. We have evaluated her for anemia including B12, iron, folate etc. Her levels were normal. Thyroid was also normal. We can certainly repeat her thyroid again today. She feels very strongly that could be related to that gland.  I would also like to check a CRP and a sedimentation rate to look for any type of inflammation that could be an indicator of autoimmune disorder. She says she has an occasional problem with joint pain and muscle tension but it's not frequent. Also consider that she could have a chronic sinusitis that's not pulmonary with fevers and significant congestion. It could just be causing her postnasal drip Thomas sinus pain and pressure and ear pressure. I would like to get a sinus x-ray today.  Shortness of breath-she's noticed that she's not able to sing at church like she did before. We'll go ahead and get a chest x-ray today as well. We'll call with results once available.

## 2013-05-15 ENCOUNTER — Other Ambulatory Visit: Payer: Self-pay | Admitting: *Deleted

## 2013-05-15 DIAGNOSIS — R5383 Other fatigue: Principal | ICD-10-CM

## 2013-05-15 DIAGNOSIS — R5381 Other malaise: Secondary | ICD-10-CM

## 2013-05-15 LAB — C-REACTIVE PROTEIN: CRP: <0.5 mg/dL (ref <0.60)

## 2013-05-15 LAB — ANA: Anti Nuclear Antibody(ANA): NEGATIVE

## 2013-05-15 LAB — SEDIMENTATION RATE: Sed Rate: 9 mm/hr (ref 0–22)

## 2013-05-16 ENCOUNTER — Encounter: Payer: Self-pay | Admitting: Family Medicine

## 2013-05-17 ENCOUNTER — Ambulatory Visit (INDEPENDENT_AMBULATORY_CARE_PROVIDER_SITE_OTHER): Payer: Commercial Managed Care - HMO

## 2013-05-17 ENCOUNTER — Encounter: Payer: Self-pay | Admitting: Family Medicine

## 2013-05-17 DIAGNOSIS — R918 Other nonspecific abnormal finding of lung field: Secondary | ICD-10-CM

## 2013-05-17 DIAGNOSIS — R9389 Abnormal findings on diagnostic imaging of other specified body structures: Secondary | ICD-10-CM

## 2013-05-17 DIAGNOSIS — K8020 Calculus of gallbladder without cholecystitis without obstruction: Secondary | ICD-10-CM

## 2013-05-18 LAB — TSH: TSH: 3.247 u[IU]/mL (ref 0.350–4.500)

## 2013-05-20 ENCOUNTER — Other Ambulatory Visit: Payer: Self-pay | Admitting: *Deleted

## 2013-05-20 MED ORDER — SIMVASTATIN 40 MG PO TABS: 20.0000 mg | ORAL_TABLET | Freq: Every day | ORAL | Status: DC

## 2013-05-20 NOTE — Progress Notes (Signed)
Quick Note:  All labs are normal. ______ 

## 2013-05-21 ENCOUNTER — Encounter: Payer: Self-pay | Admitting: Family Medicine

## 2013-05-29 ENCOUNTER — Other Ambulatory Visit: Payer: Self-pay | Admitting: *Deleted

## 2013-05-29 ENCOUNTER — Encounter: Payer: Self-pay | Admitting: Family Medicine

## 2013-05-29 ENCOUNTER — Other Ambulatory Visit: Payer: Self-pay | Admitting: Family Medicine

## 2013-05-29 DIAGNOSIS — R319 Hematuria, unspecified: Secondary | ICD-10-CM

## 2013-05-29 DIAGNOSIS — R300 Dysuria: Secondary | ICD-10-CM

## 2013-05-29 LAB — POCT URINALYSIS DIPSTICK
Bilirubin, UA: NEGATIVE
Glucose, UA: NEGATIVE
Nitrite, UA: NEGATIVE
Protein, UA: 100
Spec Grav, UA: >=1.03
Urobilinogen, UA: 0.2
pH, UA: 6

## 2013-05-29 MED ORDER — CIPROFLOXACIN HCL 500 MG PO TABS: 500.0000 mg | ORAL_TABLET | Freq: Two times a day (BID) | ORAL | Status: AC

## 2013-06-01 LAB — URINE CULTURE: Colony Count: >=100000

## 2013-06-03 ENCOUNTER — Other Ambulatory Visit: Payer: Self-pay | Admitting: Family Medicine

## 2013-06-03 ENCOUNTER — Encounter: Payer: Self-pay | Admitting: Family Medicine

## 2013-06-03 MED ORDER — CIPROFLOXACIN HCL 500 MG PO TABS: 500.0000 mg | ORAL_TABLET | Freq: Two times a day (BID) | ORAL | Status: AC

## 2013-06-27 ENCOUNTER — Encounter: Payer: Self-pay | Admitting: Family Medicine

## 2013-06-27 ENCOUNTER — Ambulatory Visit (INDEPENDENT_AMBULATORY_CARE_PROVIDER_SITE_OTHER): Payer: Medicare HMO | Admitting: Family Medicine

## 2013-06-27 VITALS — BP 111/62 | HR 92 | Wt 190.0 lb

## 2013-06-27 DIAGNOSIS — M25562 Pain in left knee: Secondary | ICD-10-CM

## 2013-06-27 DIAGNOSIS — M25569 Pain in unspecified knee: Secondary | ICD-10-CM

## 2013-06-27 NOTE — Progress Notes (Signed)
   Subjective:    Patient ID: Cheryl [name], female    DOB: May 03, 1940, 73 y.o.   MRN: [id number]  HPI  left knee since Wednesday after riding a stationary bike,  she has been using ice and sports rub. she reports that she was on a stationary bike her pain is 7/10 only hurts when she walks. Feels like deep in her knee. She denies any swelling. She's not currently taking any pain medication as she feels she doesn't need it. She did not fill the pain while on the bike but it was several hours later and she thinks it may have been the trigger for her pain. No other falls. She did have a bush poked the back of her legs as she does have a large bruise on the back of the knee.   Review of Systems     Objective:   Physical Exam  Constitutional: She appears well-developed and well-nourished.  HENT:  Head: Normocephalic and atraumatic.  Musculoskeletal:  Left knee with normal range of motion. She does have some trace edema. She is tender along the lateral joint line but not medially and not around the patella. No increased laxity of the joint with anterior-posterior drawer. Negative McMurray's. Strength is 5 out of 5.  Skin: Skin is warm and dry.  Psychiatric: She has a normal mood and affect.   She does have a large bruise on the posterior left knee. She also has a smaller bruise on the medial joint line on her left knee as well.       Assessment & Plan:  Left knee pain- most likely strain-no sign of tendon rupture or tear. It is possible she has a cartilage tear that may have been aggravated while riding on a stationary bike. For now recommend low-dose anti-inflammatory to help with pain as well as swelling. Compression with an Ace wrap or knee sleeve, icing, elevation, and rest. Recommend not ride a stationary bike for about 1-2 weeks until pain is at least 75% improved.

## 2013-06-30 ENCOUNTER — Encounter: Payer: Self-pay | Admitting: Family Medicine

## 2013-07-02 ENCOUNTER — Encounter: Payer: Self-pay | Admitting: Family Medicine

## 2013-07-02 ENCOUNTER — Other Ambulatory Visit: Payer: Self-pay | Admitting: Family Medicine

## 2013-07-02 ENCOUNTER — Telehealth: Payer: Self-pay | Admitting: *Deleted

## 2013-07-02 DIAGNOSIS — M542 Cervicalgia: Secondary | ICD-10-CM

## 2013-07-02 DIAGNOSIS — M25562 Pain in left knee: Secondary | ICD-10-CM

## 2013-07-03 ENCOUNTER — Ambulatory Visit (INDEPENDENT_AMBULATORY_CARE_PROVIDER_SITE_OTHER): Payer: Medicare HMO

## 2013-07-03 DIAGNOSIS — M25562 Pain in left knee: Secondary | ICD-10-CM

## 2013-07-03 DIAGNOSIS — M25469 Effusion, unspecified knee: Secondary | ICD-10-CM

## 2013-07-03 DIAGNOSIS — M898X9 Other specified disorders of bone, unspecified site: Secondary | ICD-10-CM

## 2013-07-05 ENCOUNTER — Encounter: Payer: Self-pay | Admitting: Sports Medicine

## 2013-07-05 ENCOUNTER — Ambulatory Visit (INDEPENDENT_AMBULATORY_CARE_PROVIDER_SITE_OTHER): Payer: Medicare HMO | Admitting: Sports Medicine

## 2013-07-05 VITALS — BP 110/66 | HR 90 | Ht 64.0 in | Wt 192.0 lb

## 2013-07-05 DIAGNOSIS — M1712 Unilateral primary osteoarthritis, left knee: Secondary | ICD-10-CM | POA: Insufficient documentation

## 2013-07-05 DIAGNOSIS — M1710 Unilateral primary osteoarthritis, unspecified knee: Secondary | ICD-10-CM

## 2013-07-05 DIAGNOSIS — IMO0002 Reserved for concepts with insufficient information to code with codable children: Secondary | ICD-10-CM

## 2013-07-05 NOTE — Telephone Encounter (Signed)
appt made.Cheryl [name]  

## 2013-07-05 NOTE — Assessment & Plan Note (Signed)
Aspiration of 15 cc and injection as above. Home rehabilitation, Aleve twice a day as needed. Return to see me in a month. We will probably also discussed some of her back pain at the next visit.

## 2013-07-05 NOTE — Progress Notes (Signed)
   Subjective:    I'm seeing this patient as a consultation for:  Dr. Madilyn Fireman  CC: Left knee pain  HPI: This is a very pleasant 73 year old female, she overdid on a stationary bike and had resultant left knee pain and swelling, both of the medial and lateral joint lines. Pain is moderate, persistent and did not respond to oral NSAIDs. No mechanical symptoms.  Past medical history, Surgical history, Family history not pertinant except as noted below, Social history, Allergies, and medications have been entered into the medical record, reviewed, and no changes needed.   Review of Systems: No headache, visual changes, nausea, vomiting, diarrhea, constipation, dizziness, abdominal pain, skin rash, fevers, chills, night sweats, weight loss, swollen lymph nodes, body aches, joint swelling, muscle aches, chest pain, shortness of breath, mood changes, visual or auditory hallucinations.   Objective:   General: Well Developed, well nourished, and in no acute distress.  Neuro/Psych: Alert and oriented x3, extra-ocular muscles intact, able to move all 4 extremities, sensation grossly intact. Skin: Warm and dry, no rashes noted.  Respiratory: Not using accessory muscles, speaking in full sentences, trachea midline.  Cardiovascular: Pulses palpable, no extremity edema. Abdomen: Does not appear distended. Left Knee: Minimal palpable effusion with a mild fluid wave and tenderness at the joint line. ROM full in flexion and extension and lower leg rotation. Ligaments with solid consistent endpoints including ACL, PCL, LCL, MCL. Negative Mcmurray's, Apley's, and Thessalonian tests. Non painful patellar compression. Patellar glide without crepitus. Patellar and quadriceps tendons unremarkable. Hamstring and quadriceps strength is normal.   Procedure: Real-time Ultrasound Guided Injection of left knee Device: GE Logiq E  Verbal informed consent obtained.  Time-out conducted.  Noted no overlying  erythema, induration, or other signs of local infection.  Skin prepped in a sterile fashion.  Local anesthesia: Topical Ethyl chloride.  With sterile technique and under real time ultrasound guidance:  Approximately 15 cc of straw-colored fluid was aspirated, syringe which and 2 cc Kenalog 40, 4 cc lidocaine injected easily. Completed without difficulty  Pain immediately resolved suggesting accurate placement of the medication.  Advised to call if fevers/chills, erythema, induration, drainage, or persistent bleeding.  Images permanently stored and available for review in the ultrasound unit.  Impression: Technically successful ultrasound guided injection.  Impression and Recommendations:   This case required medical decision making of moderate complexity.

## 2013-08-06 ENCOUNTER — Ambulatory Visit: Payer: Medicare HMO | Admitting: Sports Medicine

## 2013-08-13 ENCOUNTER — Other Ambulatory Visit: Payer: Self-pay | Admitting: *Deleted

## 2013-08-13 ENCOUNTER — Ambulatory Visit (INDEPENDENT_AMBULATORY_CARE_PROVIDER_SITE_OTHER): Payer: Medicare HMO | Admitting: Sports Medicine

## 2013-08-13 ENCOUNTER — Encounter: Payer: Self-pay | Admitting: Sports Medicine

## 2013-08-13 VITALS — BP 109/66 | HR 98 | Ht 65.0 in | Wt 190.0 lb

## 2013-08-13 DIAGNOSIS — Z13220 Encounter for screening for lipoid disorders: Secondary | ICD-10-CM

## 2013-08-13 DIAGNOSIS — M1712 Unilateral primary osteoarthritis, left knee: Secondary | ICD-10-CM

## 2013-08-13 DIAGNOSIS — M1710 Unilateral primary osteoarthritis, unspecified knee: Secondary | ICD-10-CM

## 2013-08-13 MED ORDER — SIMVASTATIN 40 MG PO TABS: 20.0000 mg | ORAL_TABLET | Freq: Every day | ORAL | Status: DC

## 2013-08-13 MED ORDER — SIMVASTATIN 40 MG PO TABS: 40.0000 mg | ORAL_TABLET | Freq: Every day | ORAL | Status: DC

## 2013-08-13 NOTE — Assessment & Plan Note (Signed)
No response to steroid injection, starting viscous supplementation today with Marcaine and lidocaine per patient request. Return in one week for injection #2.

## 2013-08-13 NOTE — Progress Notes (Signed)
  Subjective:    CC: Followup  HPI: Left knee osteoarthritis : Cheryl [name] had a good but temporary response to a steroid injection a month ago, unfortunately has a recurrence of pain moderate, persistent without mechanical symptoms. Localized to the medial joint line.  Past medical history, Surgical history, Family history not pertinant except as noted below, Social history, Allergies, and medications have been entered into the medical record, reviewed, and no changes needed.   Review of Systems: No fevers, chills, night sweats, weight loss, chest pain, or shortness of breath.   Objective:    General: Well Developed, well nourished, and in no acute distress.  Neuro: Alert and oriented x3, extra-ocular muscles intact, sensation grossly intact.  HEENT: Normocephalic, atraumatic, pupils equal round reactive to light, neck supple, no masses, no lymphadenopathy, thyroid nonpalpable.  Skin: Warm and dry, no rashes. Cardiac: Regular rate and rhythm, no murmurs rubs or gallops, no lower extremity edema.  Respiratory: Clear to auscultation bilaterally. Not using accessory muscles, speaking in full sentences.  Procedure: Real-time Ultrasound Guided Injection of left knee Device: GE Logiq E  Verbal informed consent obtained.  Time-out conducted.  Noted no overlying erythema, induration, or other signs of local infection.  Skin prepped in a sterile fashion.  Local anesthesia: Topical Ethyl chloride.  With sterile technique and under real time ultrasound guidance:   3 cc lidocaine, 3 cc Marcaine injected into the suprapatellar recess, syringe switched and 25 mg/2.5 mL of Supartz (sodium hyaluronate) in a prefilled syringe was injected easily into the knee through a 22-gauge needle. Completed without difficulty  Pain immediately resolved suggesting accurate placement of the medication.  Advised to call if fevers/chills, erythema, induration, drainage, or persistent bleeding.  Images permanently stored and  available for review in the ultrasound unit.  Impression: Technically successful ultrasound guided injection.  Impression and Recommendations:

## 2013-08-13 NOTE — Telephone Encounter (Signed)
Order for lipid panel was given to patient and a 90 day refill of her simvastatin was sent to pharmacy at her request. Margette Fast, CMA

## 2013-08-15 ENCOUNTER — Encounter: Payer: Self-pay | Admitting: Family Medicine

## 2013-08-19 ENCOUNTER — Other Ambulatory Visit: Payer: Self-pay | Admitting: Family Medicine

## 2013-08-19 ENCOUNTER — Telehealth: Payer: Self-pay | Admitting: Family Medicine

## 2013-08-19 ENCOUNTER — Ambulatory Visit (INDEPENDENT_AMBULATORY_CARE_PROVIDER_SITE_OTHER): Payer: Medicare HMO | Admitting: Sports Medicine

## 2013-08-19 ENCOUNTER — Encounter: Payer: Self-pay | Admitting: Sports Medicine

## 2013-08-19 VITALS — BP 122/57 | HR 82 | Ht 65.0 in | Wt 191.0 lb

## 2013-08-19 DIAGNOSIS — I6529 Occlusion and stenosis of unspecified carotid artery: Secondary | ICD-10-CM

## 2013-08-19 DIAGNOSIS — M1710 Unilateral primary osteoarthritis, unspecified knee: Secondary | ICD-10-CM

## 2013-08-19 DIAGNOSIS — M1712 Unilateral primary osteoarthritis, left knee: Secondary | ICD-10-CM

## 2013-08-19 LAB — COMPREHENSIVE METABOLIC PANEL
ALT: 25 U/L (ref 0–35)
AST: 26 U/L (ref 0–37)
Albumin: 4.2 g/dL (ref 3.5–5.2)
Alkaline Phosphatase: 74 U/L (ref 39–117)
BUN: 8 mg/dL (ref 6–23)
CO2: 27 mEq/L (ref 19–32)
Calcium: 9.7 mg/dL (ref 8.4–10.5)
Chloride: 102 mEq/L (ref 96–112)
Creat: 0.84 mg/dL (ref 0.50–1.10)
Glucose, Bld: 99 mg/dL (ref 70–99)
Potassium: 4.6 mEq/L (ref 3.5–5.3)
Sodium: 141 mEq/L (ref 135–145)
Total Bilirubin: 0.4 mg/dL (ref 0.2–1.2)
Total Protein: 6.5 g/dL (ref 6.0–8.3)

## 2013-08-19 LAB — HEPATIC FUNCTION PANEL: Bilirubin, Total: 0.4 mg/dL

## 2013-08-19 LAB — BASIC METABOLIC PANEL
BUN: 8 mg/dL (ref 4–21)
Creatinine: 0.8 mg/dL (ref 0.5–1.1)
Glucose: 99 mg/dL
Potassium: 4.6 mmol/L (ref 3.4–5.3)
Sodium: 141 mmol/L (ref 137–147)

## 2013-08-19 NOTE — Progress Notes (Signed)
  Procedure: Real-time Ultrasound Guided Injection of left knee Device: GE Logiq E  Verbal informed consent obtained.  Time-out conducted.  Noted no overlying erythema, induration, or other signs of local infection.  Skin prepped in a sterile fashion.  Local anesthesia: Topical Ethyl chloride.  With sterile technique and under real time ultrasound guidance: 25 mg/2.5 mL of Supartz (sodium hyaluronate) in a prefilled syringe was injected easily into the knee through a 22-gauge needle. Completed without difficulty  Pain immediately resolved suggesting accurate placement of the medication.  Advised to call if fevers/chills, erythema, induration, drainage, or persistent bleeding.  Images permanently stored and available for review in the ultrasound unit.  Impression: Technically successful ultrasound guided injection. 

## 2013-08-19 NOTE — Assessment & Plan Note (Signed)
Injection #2 of 5 and to the left knee. Return in one week for #3.

## 2013-08-19 NOTE — Telephone Encounter (Signed)
Call pt: got her letter from [facility]. Let's ger her scheduled for carotid dopplers.  i will place order.

## 2013-08-20 ENCOUNTER — Telehealth: Payer: Self-pay | Admitting: Family Medicine

## 2013-08-20 NOTE — Telephone Encounter (Signed)
Pt informed.Cheryl [name]  

## 2013-08-20 NOTE — Progress Notes (Signed)
Quick Note:  All labs are normal. ______ 

## 2013-08-20 NOTE — Telephone Encounter (Signed)
Please call patient: Cheryl [name] looks great.

## 2013-08-21 NOTE — Telephone Encounter (Signed)
lvm w/ results.Cheryl [name]  

## 2013-08-26 ENCOUNTER — Ambulatory Visit: Payer: Medicare HMO | Admitting: Sports Medicine

## 2013-08-27 ENCOUNTER — Ambulatory Visit (INDEPENDENT_AMBULATORY_CARE_PROVIDER_SITE_OTHER): Payer: Medicare HMO | Admitting: Sports Medicine

## 2013-08-27 ENCOUNTER — Encounter: Payer: Self-pay | Admitting: Sports Medicine

## 2013-08-27 VITALS — BP 106/61 | HR 97 | Ht 64.0 in | Wt 190.0 lb

## 2013-08-27 DIAGNOSIS — M1710 Unilateral primary osteoarthritis, unspecified knee: Secondary | ICD-10-CM

## 2013-08-27 DIAGNOSIS — M1712 Unilateral primary osteoarthritis, left knee: Secondary | ICD-10-CM

## 2013-08-27 NOTE — Progress Notes (Signed)
  Procedure: Real-time Ultrasound Guided Injection of left knee Device: GE Logiq E  Verbal informed consent obtained.  Time-out conducted.  Noted no overlying erythema, induration, or other signs of local infection.  Skin prepped in a sterile fashion.  Local anesthesia: Topical Ethyl chloride.  With sterile technique and under real time ultrasound guidance: 25 mg/2.5 mL of Supartz (sodium hyaluronate) in a prefilled syringe was injected easily into the knee through a 22-gauge needle. Completed without difficulty  Pain immediately resolved suggesting accurate placement of the medication.  Advised to call if fevers/chills, erythema, induration, drainage, or persistent bleeding.  Images permanently stored and available for review in the ultrasound unit.  Impression: Technically successful ultrasound guided injection. 

## 2013-08-27 NOTE — Assessment & Plan Note (Signed)
Supartz injection #3 of 5 into the left knee. Return in one week for #4. Currently pain-free.

## 2013-08-29 ENCOUNTER — Telehealth: Payer: Self-pay | Admitting: *Deleted

## 2013-08-29 NOTE — Telephone Encounter (Signed)
Caroline from [facility] called and lvm stating that the precert for ms. Kusek's bilateral cardioid duplex will need done and that she had lvm with Roselyn Reef and hasn't heard back from her. Will forward to [facility] for f/u.Audelia Hives [place]

## 2013-09-02 ENCOUNTER — Ambulatory Visit (INDEPENDENT_AMBULATORY_CARE_PROVIDER_SITE_OTHER): Payer: Medicare HMO | Admitting: Sports Medicine

## 2013-09-02 ENCOUNTER — Encounter: Payer: Self-pay | Admitting: Sports Medicine

## 2013-09-02 VITALS — BP 112/58 | HR 93 | Ht 64.0 in | Wt 192.0 lb

## 2013-09-02 DIAGNOSIS — M1710 Unilateral primary osteoarthritis, unspecified knee: Secondary | ICD-10-CM

## 2013-09-02 DIAGNOSIS — M1712 Unilateral primary osteoarthritis, left knee: Secondary | ICD-10-CM

## 2013-09-02 MED ORDER — GLUCOSAMINE-CHONDROITIN 750-600 MG PO CHEW: 2.0000 | CHEWABLE_TABLET | Freq: Two times a day (BID) | ORAL | Status: DC

## 2013-09-02 MED ORDER — DEVILS CLAW ROOT POWD: Status: DC

## 2013-09-02 NOTE — Assessment & Plan Note (Signed)
Supartz injection #4 of 5 into the left knee. Return in one week for #5. Currently pain-free.

## 2013-09-02 NOTE — Progress Notes (Signed)
  Procedure: Real-time Ultrasound Guided Injection of left knee Device: GE Logiq E  Verbal informed consent obtained.  Time-out conducted.  Noted no overlying erythema, induration, or other signs of local infection.  Skin prepped in a sterile fashion.  Local anesthesia: Topical Ethyl chloride.  With sterile technique and under real time ultrasound guidance: 25 mg/2.5 mL of Supartz (sodium hyaluronate) in a prefilled syringe was injected easily into the knee through a 22-gauge needle. Completed without difficulty  Pain immediately resolved suggesting accurate placement of the medication.  Advised to call if fevers/chills, erythema, induration, drainage, or persistent bleeding.  Images permanently stored and available for review in the ultrasound unit.  Impression: Technically successful ultrasound guided injection. 

## 2013-09-09 ENCOUNTER — Ambulatory Visit: Payer: Medicare HMO | Admitting: Sports Medicine

## 2013-09-12 ENCOUNTER — Encounter: Payer: Self-pay | Admitting: Sports Medicine

## 2013-09-12 ENCOUNTER — Ambulatory Visit (INDEPENDENT_AMBULATORY_CARE_PROVIDER_SITE_OTHER): Payer: Medicare HMO | Admitting: Sports Medicine

## 2013-09-12 VITALS — BP 130/68 | HR 105 | Temp 98.4°F | Ht 64.0 in | Wt 191.0 lb

## 2013-09-12 DIAGNOSIS — M1712 Unilateral primary osteoarthritis, left knee: Secondary | ICD-10-CM

## 2013-09-12 DIAGNOSIS — M1710 Unilateral primary osteoarthritis, unspecified knee: Secondary | ICD-10-CM

## 2013-09-12 NOTE — Assessment & Plan Note (Signed)
No Supartz in stock, OrthoVisc injection given today to complete the series of 5. Return as needed if she decides to start on the right knee.

## 2013-09-12 NOTE — Progress Notes (Signed)
  Procedure: Real-time Ultrasound Guided Injection of left knee Device: GE Logiq E  Verbal informed consent obtained.  Time-out conducted.  Noted no overlying erythema, induration, or other signs of local infection.  Skin prepped in a sterile fashion.  Local anesthesia: Topical Ethyl chloride.  With sterile technique and under real time ultrasound guidance: 25 mg/2.5 mL of Supartz (sodium hyaluronate) in a prefilled syringe was injected easily into the knee through a 22-gauge needle. Completed without difficulty  Pain immediately resolved suggesting accurate placement of the medication.  Advised to call if fevers/chills, erythema, induration, drainage, or persistent bleeding.  Images permanently stored and available for review in the ultrasound unit.  Impression: Technically successful ultrasound guided injection. 

## 2013-09-17 ENCOUNTER — Encounter: Payer: Self-pay | Admitting: Family Medicine

## 2013-09-18 ENCOUNTER — Other Ambulatory Visit: Payer: Self-pay | Admitting: Family Medicine

## 2013-09-20 ENCOUNTER — Other Ambulatory Visit: Payer: Self-pay | Admitting: *Deleted

## 2013-09-20 MED ORDER — ALPRAZOLAM 0.5 MG PO TABS: 0.5000 mg | ORAL_TABLET | Freq: Every evening | ORAL | Status: DC | PRN

## 2013-10-01 ENCOUNTER — Other Ambulatory Visit: Payer: Self-pay | Admitting: Family Medicine

## 2013-10-01 ENCOUNTER — Encounter: Payer: Self-pay | Admitting: Family Medicine

## 2013-10-01 ENCOUNTER — Ambulatory Visit (INDEPENDENT_AMBULATORY_CARE_PROVIDER_SITE_OTHER): Payer: Medicare HMO | Admitting: Family Medicine

## 2013-10-01 VITALS — BP 120/70 | HR 103 | Ht 65.0 in | Wt 189.0 lb

## 2013-10-01 DIAGNOSIS — R110 Nausea: Secondary | ICD-10-CM

## 2013-10-01 DIAGNOSIS — Z23 Encounter for immunization: Secondary | ICD-10-CM

## 2013-10-01 DIAGNOSIS — L299 Pruritus, unspecified: Secondary | ICD-10-CM

## 2013-10-01 NOTE — Progress Notes (Signed)
Subjective:    Patient ID: Cheryl [name], female    DOB: 08-May-1940, 73 y.o.   MRN: [id number]  Leg Pain    She has been nauseated and itching since last fall. Has been off her antihistamines for one week.  Eating a peppermint and drinking ginger bites seems to settle her stomach. Says constantly feels nauseated and itching.  She has been moisturizing well. No change in soap, lotions, detergents, fabric softener.   No GERD.  Has been belching more.  More gas. Still on omeprazole.  She is not taking it BID x 2 weeks and not real improvement.  No constipation.  No blood inr the stool or urine.  Did have some allergy testing done about 8 years ago.  She was told she was not allergic to anything.   She had to put her dog down.   She has a friend with her here today.    Review of Systems     BP 120/70  Pulse 103  Ht 5\' 5"  (1.651 m)  Wt 189 lb (85.73 kg)  BMI 31.45 kg/m2    Allergies  Allergen Reactions  . Codeine Sulfate     REACTION: pruritis  . Contrast Media [Iodinated Diagnostic Agents] Itching    Multilance.   . Fluoxetine Hcl     REACTION: itching,SOB  . Sulfonamide Derivatives     REACTION: Nausea    Past Medical History  Diagnosis Date  . Constipation   . Arthritis   . Depression   . GERD (gastroesophageal reflux disease)   . Hyperlipidemia   . Osteopenia   . Bronchitis     saw md 10-05-10  . Uterine cancer 1973  . Diverticulosis   . Lung nodules   . Irritable bowel syndrome   . Bulging discs   . Spinal stenosis   . Bone spur   . Sigmoid diverticulitis   . Internal hemorrhoids   . Migraine headache   . Adenomatous colon polyp 2012  . Cyst, kidney, acquired   . Pancreatitis   . Hepatitis 2012    Past Surgical History  Procedure Laterality Date  . Appendectomy  1966  . Vaginal hysterectomy  1973  . Rotator cuff repair  2009    left  . Rectocele repair  2011  . Tonsillectomy    . Cataracts      bilateral  . Eus  02/10/2011    Procedure: UPPER  ENDOSCOPIC ULTRASOUND (EUS) RADIAL;  Surgeon: Owens Loffler, MD;  Location: [facility];  Service: Endoscopy;  Laterality: N/A;  radial linear     History   Social History  . Marital Status: Legally Separated    Spouse Name: N/A    Number of Children: 4  . Years of Education: N/A   Occupational History  . Retired      Social History Main Topics  . Smoking status: Former Smoker    Quit date: 02/07/1993  . Smokeless tobacco: Never Used  . Alcohol Use: No  . Drug Use: No  . Sexual Activity: Not on file   Other Topics Concern  . Not on file   Social History Narrative   Walking daily.      Family History  Problem Relation Age of Onset  . Alcohol abuse Father   . Lung cancer Father     smoker  . Kidney failure Mother     med induced  . Rheum arthritis Mother   . Depression Brother   . Heart disease  Paternal family   . Heart failure Daughter   . Lung cancer Son   . Crohn's disease Brother   . Alcohol abuse Brother   . HIV Son   . Malignant hyperthermia Neg Hx     Outpatient Encounter Prescriptions as of 10/01/2013  Medication Sig  . ALPRAZolam (XANAX) 0.5 MG tablet Take 1 tablet (0.5 mg total) by mouth at bedtime as needed for anxiety.  Marland Kitchen aspirin 81 MG tablet Take 81 mg by mouth daily.    . Cholecalciferol (VITAMIN D) 1000 UNITS capsule Take 1,000 Units by mouth daily.    . fluticasone (FLONASE) 50 MCG/ACT nasal spray Place 2 sprays into both nostrils daily. Please dispense 3 month supply  . HYDROcodone-acetaminophen (NORCO) 5-325 MG per tablet Take 1 tablet by mouth every 4 (four) hours as needed.  Marland Kitchen omeprazole (PRILOSEC) 20 MG capsule Take 1 capsule (20 mg total) by mouth 2 (two) times daily before a meal.  . PARoxetine (PAXIL) 20 MG tablet Take 1 tablet (20 mg total) by mouth every morning.  Marland Kitchen Phenazopyridine HCl (AZO STANDARD MAXIMUM STRENGTH PO) Take 1 tablet by mouth daily as needed (pain/burning).  . RESTASIS 0.05 % ophthalmic emulsion Place 1 drop into  both eyes 2 (two) times daily.   . simvastatin (ZOCOR) 40 MG tablet Take 0.5 tablets (20 mg total) by mouth at bedtime. Take 0.5 tablets (20 mg total) by mouth at bedtime  . traMADol (ULTRAM) 50 MG tablet Take 1 tablet (50 mg total) by mouth 2 (two) times daily as needed.  . zolpidem (AMBIEN) 5 MG tablet Take 5 mg by mouth at bedtime as needed for sleep.  Lonia Skinner Claw Root POWD 2,600 mg PO daily.  . [DISCONTINUED] Glucosamine-Chondroitin ([facility] GLUCOSAMINE-CHONDROITIN) 750-600 MG CHEW Chew 2 tablets by mouth 2 (two) times daily.       Objective:   Physical Exam  Constitutional: She is oriented to person, place, and time. She appears well-developed and well-nourished.  HENT:  Head: Normocephalic and atraumatic.  Right Ear: External ear normal.  Left Ear: External ear normal.  Nose: Nose normal.  Mouth/Throat: Oropharynx is clear and moist.  TMs and canals are clear.   Eyes: Conjunctivae and EOM are normal. Pupils are equal, round, and reactive to light.  Neck: Neck supple. No thyromegaly present.  Cardiovascular: Normal rate, regular rhythm and normal heart sounds.   Pulmonary/Chest: Effort normal and breath sounds normal. She has no wheezes.  Abdominal: Soft. Bowel sounds are normal. She exhibits no distension and no mass. There is tenderness. There is no rebound and no guarding.  + TTP in the epigastric area, right lower quadrant, left lower port. No rebound phenomenon or organomegaly.  Musculoskeletal: She exhibits no edema.  Lymphadenopathy:    She has no cervical adenopathy.  Neurological: She is alert and oriented to person, place, and time.  Skin: Skin is warm and dry. No rash noted.  Psychiatric: She has a normal mood and affect.         c Assessment & Plan:  Nausea - will check CBC w/ dff, will check pancreas.  Consider ulcer, but she is already taking her PPI BID x 2 weeks with no improvement.Marland Kitchen Hx of acute pancreatitis.  So will check this. She has been under a lot of  stress lately as well. Consider GI referral if sxs persist.   Pruritis - unclear etiology-she had normal liver enzymes last month. We'll do allergy testing today. No actual rash.  No recent changes in  medication so less likely to be a med S.E.

## 2013-10-02 ENCOUNTER — Other Ambulatory Visit: Payer: Self-pay

## 2013-10-02 ENCOUNTER — Other Ambulatory Visit: Payer: Self-pay | Admitting: Family Medicine

## 2013-10-02 ENCOUNTER — Encounter: Payer: Self-pay | Admitting: Family Medicine

## 2013-10-02 MED ORDER — ZOLPIDEM TARTRATE 5 MG PO TABS: 5.0000 mg | ORAL_TABLET | Freq: Every evening | ORAL | Status: DC | PRN

## 2013-10-02 MED ORDER — SIMVASTATIN 40 MG PO TABS: 20.0000 mg | ORAL_TABLET | Freq: Every day | ORAL | Status: DC

## 2013-10-02 NOTE — Telephone Encounter (Signed)
Sent refill of simvastatin to [facility] instead of [facility].

## 2013-10-04 ENCOUNTER — Encounter: Payer: Self-pay | Admitting: Family Medicine

## 2013-10-04 ENCOUNTER — Telehealth: Payer: Self-pay | Admitting: Family Medicine

## 2013-10-04 NOTE — Telephone Encounter (Signed)
Please call lab. Pt says she went on Tuesday but there is nothing in the system.

## 2013-10-07 ENCOUNTER — Encounter: Payer: Self-pay | Admitting: Family Medicine

## 2013-10-07 ENCOUNTER — Telehealth: Payer: Self-pay | Admitting: Family Medicine

## 2013-10-07 NOTE — Telephone Encounter (Signed)
Please call the lab and find out what happened to her labs Drawn last Tuesday.

## 2013-10-07 NOTE — Telephone Encounter (Signed)
Labs are being faxed.

## 2013-10-07 NOTE — Telephone Encounter (Signed)
Patient advised of results and recommendations. She would like something for the itching.

## 2013-10-07 NOTE — Telephone Encounter (Signed)
Please call patient and let her know that on her allergy profile she did have a mild allergy to one of the molds. Everything else actually looked pretty good. Her blood count was normal. No anemia. Pancreatic enzymes are normal. Thyroid looks great including a T4 and T3. Iron stores look great. And she did have a borderline milk allergy. It may be worth trying to avoid milk and dairy products for a months to see if she feels any better.

## 2013-10-08 MED ORDER — HYDROXYZINE PAMOATE 25 MG PO CAPS: 25.0000 mg | ORAL_CAPSULE | Freq: Three times a day (TID) | ORAL | Status: DC | PRN

## 2013-10-08 NOTE — Telephone Encounter (Signed)
Left message advising medication has been sent.

## 2013-10-08 NOTE — Telephone Encounter (Signed)
rx sent

## 2013-10-09 LAB — ALLERGEN FOOD PROFILE SPECIFIC IGE
Apple: <0.1 kU/L
Chicken IgE: <0.1 kU/L
Corn: <0.1 kU/L
Egg White IgE: <0.1 kU/L
Fish Cod: <0.1 kU/L
IgE (Immunoglobulin E), Serum: 68.5 IU/mL (ref 0.0–180.0)
Milk IgE: 0.16 kU/L — ABNORMAL HIGH
Orange: <0.1 kU/L
Peanut IgE: <0.1 kU/L
Shrimp IgE: <0.1 kU/L
Soybean IgE: <0.1 kU/L
Tomato IgE: <0.1 kU/L
Tuna IgE: <0.1 kU/L
Wheat IgE: <0.1 kU/L

## 2013-11-05 ENCOUNTER — Encounter: Payer: Self-pay | Admitting: Family Medicine

## 2013-11-05 ENCOUNTER — Telehealth: Payer: Self-pay

## 2013-11-05 DIAGNOSIS — M545 Low back pain, unspecified: Secondary | ICD-10-CM

## 2013-11-05 NOTE — Telephone Encounter (Signed)
Referral placed.

## 2013-11-05 NOTE — Telephone Encounter (Signed)
Cheryl [name] needs a referral to Dr. Bonna Gains Address: [address], [place], [place] [address] [phone] with [facility] for low back pain.   Referral is pending.

## 2013-11-19 ENCOUNTER — Telehealth: Payer: Self-pay | Admitting: *Deleted

## 2013-11-19 DIAGNOSIS — Z1231 Encounter for screening mammogram for malignant neoplasm of breast: Secondary | ICD-10-CM

## 2013-11-19 NOTE — Telephone Encounter (Signed)
Pt called and stated that she will need a referral/order placed to have her mammogram done.Cheryl [name] [place]

## 2013-11-21 DIAGNOSIS — M47816 Spondylosis without myelopathy or radiculopathy, lumbar region: Secondary | ICD-10-CM | POA: Insufficient documentation

## 2013-11-26 ENCOUNTER — Other Ambulatory Visit: Payer: Self-pay | Admitting: Family Medicine

## 2013-12-02 ENCOUNTER — Telehealth: Payer: Self-pay | Admitting: Family Medicine

## 2013-12-02 DIAGNOSIS — H109 Unspecified conjunctivitis: Secondary | ICD-10-CM

## 2013-12-02 NOTE — Telephone Encounter (Signed)
Catalina Gravel from [facility] called today and said that [name] was still being seen in their office and the original referral was only for 4 visits between 3/20 and 6/18. They are asking if we can please do another referral so we can get an additional auth from [facility] to finish up her visits. - CF

## 2013-12-02 NOTE — Telephone Encounter (Signed)
Referral placed.Cheryl [name] Cheryl [name]  

## 2013-12-05 ENCOUNTER — Encounter: Payer: Self-pay | Admitting: Family Medicine

## 2013-12-10 ENCOUNTER — Other Ambulatory Visit: Payer: Self-pay | Admitting: Family Medicine

## 2013-12-10 DIAGNOSIS — N390 Urinary tract infection, site not specified: Secondary | ICD-10-CM

## 2013-12-17 ENCOUNTER — Ambulatory Visit (INDEPENDENT_AMBULATORY_CARE_PROVIDER_SITE_OTHER): Payer: Commercial Managed Care - HMO | Admitting: Family Medicine

## 2013-12-17 ENCOUNTER — Encounter: Payer: Self-pay | Admitting: Family Medicine

## 2013-12-17 VITALS — BP 124/67 | HR 88 | Ht 65.0 in | Wt 185.0 lb

## 2013-12-17 DIAGNOSIS — R7309 Other abnormal glucose: Secondary | ICD-10-CM

## 2013-12-17 DIAGNOSIS — E785 Hyperlipidemia, unspecified: Secondary | ICD-10-CM

## 2013-12-17 DIAGNOSIS — Z0000 Encounter for general adult medical examination without abnormal findings: Secondary | ICD-10-CM

## 2013-12-17 DIAGNOSIS — Z780 Asymptomatic menopausal state: Secondary | ICD-10-CM

## 2013-12-17 DIAGNOSIS — Z23 Encounter for immunization: Secondary | ICD-10-CM

## 2013-12-17 LAB — LIPID PANEL
Cholesterol: 165 mg/dL (ref 0–200)
HDL: 57 mg/dL (ref >39)
LDL Cholesterol: 79 mg/dL (ref 0–99)
Total CHOL/HDL Ratio: 2.9 Ratio
Triglycerides: 146 mg/dL (ref <150)
VLDL: 29 mg/dL (ref 0–40)

## 2013-12-17 LAB — POCT GLYCOSYLATED HEMOGLOBIN (HGB A1C): Hemoglobin A1C: 5.8

## 2013-12-17 NOTE — Patient Instructions (Signed)
Keep up a regular exercise program and make sure you are eating a healthy diet Try to eat 4 servings of dairy a day, or if you are lactose intolerant take a calcium with vitamin D daily.  Your vaccines are up to date.  We ordered your bone density test.

## 2013-12-17 NOTE — Progress Notes (Signed)
Subjective:    Cheryl [name] is a 73 y.o. female who presents for Medicare Annual/Subsequent preventive examination.  Preventive Screening-Counseling & Management  Tobacco History  Smoking status  . Former Smoker  . Quit date: 02/07/1993  Smokeless tobacco  . Never Used     Problems Prior to Visit 1. Has been walking more with her new dog. Has lost 4lbs. Says heritching resolved. She thinks it was mostly stress related.    Current Problems (verified) Patient Active Problem List   Diagnosis Date Noted  . Hyperlipidemia 12/17/2013  . Osteoarthritis of left knee 07/05/2013  . Obesity 03/10/2011  . Pancreatitis 02/10/2011  . Gastritis 02/10/2011  . Dyspepsia 02/10/2011  . Pulmonary nodule 12/10/2010  . PALPITATIONS 02/11/2010  . OSTEOPENIA 11/27/2009  . CONSTIPATION, CHRONIC 08/15/2008  . ONYCHOMYCOSIS 07/08/2008  . INSOMNIA 07/08/2008  . NECK PAIN 04/11/2008  . UNSPECIFIED DISORDER OF KIDNEY AND URETER 01/21/2008  . HEMORRHOIDS, INTERNAL 01/16/2008  . DIVERTICULOSIS, COLON 01/16/2008  . POSTMENOPAUSAL STATUS 12/05/2007  . MICROSCOPIC HEMATURIA 06/05/2007  . PULMONARY NODULE 12/22/2005  . STENOSIS, LUMBAR SPINE 12/22/2005  . ANXIETY 11/15/2005  . IRRITABLE BOWEL SYNDROME 11/15/2005  . ARTHRITIS 11/15/2005    Medications Prior to Visit Current Outpatient Prescriptions on File Prior to Visit  Medication Sig Dispense Refill  . ALPRAZolam (XANAX) 0.5 MG tablet Take 1 tablet (0.5 mg total) by mouth at bedtime as needed for anxiety. 15 tablet 1  . aspirin 81 MG tablet Take 81 mg by mouth daily.      . Cholecalciferol (VITAMIN D) 1000 UNITS capsule Take 1,000 Units by mouth daily.      . fluticasone (FLONASE) 50 MCG/ACT nasal spray USE 2 SPRAYS IN EACH NOSTRIL EVERY DAY 48 g 3  . HYDROcodone-acetaminophen (NORCO) 5-325 MG per tablet Take 1 tablet by mouth every 4 (four) hours as needed. 15 tablet 0  . hydrOXYzine (VISTARIL) 25 MG capsule Take 1 capsule (25 mg total) by  mouth 3 (three) times daily as needed for itching. 90 capsule 0  . omeprazole (PRILOSEC) 20 MG capsule Take 1 capsule (20 mg total) by mouth 2 (two) times daily before a meal. 180 capsule 3  . PARoxetine (PAXIL) 20 MG tablet TAKE 1 TABLET EVERY MORNING 90 tablet 3  . Phenazopyridine HCl (AZO STANDARD MAXIMUM STRENGTH PO) Take 1 tablet by mouth daily as needed (pain/burning).    . RESTASIS 0.05 % ophthalmic emulsion Place 1 drop into both eyes 2 (two) times daily.     . simvastatin (ZOCOR) 40 MG tablet Take 0.5 tablets (20 mg total) by mouth at bedtime. Take 0.5 tablets (20 mg total) by mouth at bedtime 90 tablet 0  . traMADol (ULTRAM) 50 MG tablet Take 1 tablet (50 mg total) by mouth 2 (two) times daily as needed. 180 tablet 1  . zolpidem (AMBIEN) 5 MG tablet Take 1 tablet (5 mg total) by mouth at bedtime as needed for sleep. 30 tablet 1  . Devils Claw Root POWD 2,600 mg PO daily.     No current facility-administered medications on file prior to visit.    Current Medications (verified) Current Outpatient Prescriptions  Medication Sig Dispense Refill  . ALPRAZolam (XANAX) 0.5 MG tablet Take 1 tablet (0.5 mg total) by mouth at bedtime as needed for anxiety. 15 tablet 1  . aspirin 81 MG tablet Take 81 mg by mouth daily.      . Cholecalciferol (VITAMIN D) 1000 UNITS capsule Take 1,000 Units by mouth daily.      Marland Kitchen  fluticasone (FLONASE) 50 MCG/ACT nasal spray USE 2 SPRAYS IN EACH NOSTRIL EVERY DAY 48 g 3  . HYDROcodone-acetaminophen (NORCO) 5-325 MG per tablet Take 1 tablet by mouth every 4 (four) hours as needed. 15 tablet 0  . hydrOXYzine (VISTARIL) 25 MG capsule Take 1 capsule (25 mg total) by mouth 3 (three) times daily as needed for itching. 90 capsule 0  . omeprazole (PRILOSEC) 20 MG capsule Take 1 capsule (20 mg total) by mouth 2 (two) times daily before a meal. 180 capsule 3  . PARoxetine (PAXIL) 20 MG tablet TAKE 1 TABLET EVERY MORNING 90 tablet 3  . Phenazopyridine HCl (AZO STANDARD  MAXIMUM STRENGTH PO) Take 1 tablet by mouth daily as needed (pain/burning).    . RESTASIS 0.05 % ophthalmic emulsion Place 1 drop into both eyes 2 (two) times daily.     . simvastatin (ZOCOR) 40 MG tablet Take 0.5 tablets (20 mg total) by mouth at bedtime. Take 0.5 tablets (20 mg total) by mouth at bedtime 90 tablet 0  . traMADol (ULTRAM) 50 MG tablet Take 1 tablet (50 mg total) by mouth 2 (two) times daily as needed. 180 tablet 1  . zolpidem (AMBIEN) 5 MG tablet Take 1 tablet (5 mg total) by mouth at bedtime as needed for sleep. 30 tablet 1  . Devils Claw Root POWD 2,600 mg PO daily.     No current facility-administered medications for this visit.     Allergies (verified) Codeine sulfate; Contrast media; Fluoxetine hcl; and Sulfonamide derivatives   PAST HISTORY  Family History Family History  Problem Relation Age of Onset  . Alcohol abuse Father   . Lung cancer Father     smoker  . Kidney failure Mother     med induced  . Rheum arthritis Mother   . Depression Brother   . Heart disease      Paternal family   . Heart failure Daughter   . Lung cancer Son   . Crohn's disease Brother   . Alcohol abuse Brother   . HIV Son   . Malignant hyperthermia Neg Hx     Social History History  Substance Use Topics  . Smoking status: Former Smoker    Quit date: 02/07/1993  . Smokeless tobacco: Never Used  . Alcohol Use: No     Are there smokers in your home (other than you)? No  Risk Factors Current exercise habits: Home exercise routine includes walking 0.5 hrs per day.  Dietary issues discussed: None   Cardiac risk factors: advanced age (older than 45 for men, 14 for women) and obesity (BMI >= 30 kg/m2).  Depression Screen (Note: if answer to either of the following is "Yes", a more complete depression screening is indicated)   Over the past two weeks, have you felt down, depressed or hopeless? No  Over the past two weeks, have you felt little interest or pleasure in doing  things? No  Have you lost interest or pleasure in daily life? No  Do you often feel hopeless? No  Do you cry easily over simple problems? No  Activities of Daily Living In your present state of health, do you have any difficulty performing the following activities?:  Driving? No Managing money?  No Feeding yourself? No Getting from bed to chair? No  Climbing a flight of stairs? No Preparing food and eating?: No Bathing or showering? No Getting dressed: No Getting to the toilet? No Using the toilet:No Moving around from place to place: No In  the past year have you fallen or had a near fall?:No   Are you sexually active?  No  Do you have more than one partner?  No  Hearing Difficulties: Yes Do you often ask people to speak up or repeat themselves? Yes Do you experience ringing or noises in your ears? Yes Do you have difficulty understanding soft or whispered voices? Yes   Do you feel that you have a problem with memory? No  Do you often misplace items? No  Do you feel safe at home?  Yes  Cognitive Testing  Alert? Yes  Normal Appearance?Yes  Oriented to person? Yes  Place? Yes   Time? Yes    6 CIT score 6/28 (normal).    Advanced Directives have been discussed with the patient? Yes  List the Names of Other Physician/Practitioners you currently use: 1.  Dt. Romona Curls  Indicate any recent Medical Services you may have received from other than [facility] providers in the past year (date may be approximate).  Immunization History  Administered Date(s) Administered  . Influenza Split 12/20/2011  . Influenza Whole 11/08/2004, 11/08/2005, 11/24/2006, 11/12/2007, 12/15/2008, 11/27/2009  . Influenza,inj,Quad PF,36+ Mos 10/10/2012, 10/01/2013  . Pneumococcal Polysaccharide-23 02/07/2005, 12/15/2010  . Td 02/07/2002  . Tdap 10/10/2012  . Zoster 11/27/2009    Screening Tests Health Maintenance  Topic Date Due  . MAMMOGRAM  12/22/2013  . INFLUENZA VACCINE  09/08/2014  .  COLONOSCOPY  10/07/2015  . TETANUS/TDAP  10/11/2022  . DEXA SCAN  Completed  . PNEUMOCOCCAL POLYSACCHARIDE VACCINE AGE 90 AND OVER  Completed  . ZOSTAVAX  Completed    All answers were reviewed with the patient and necessary referrals were made:  METHENEY,CATHERINE, MD   12/17/2013   History reviewed: allergies, current medications, past family history, past medical history, past social history, past surgical history and problem list  Review of Systems A comprehensive review of systems was negative.    Objective:     Vision by Snellen chart: right eye:Had eye exam last months.   Body mass index is 30.79 kg/(m^2). BP 124/67 mmHg  Pulse 88  Ht 5\' 5"  (1.651 m)  Wt 185 lb (83.915 kg)  BMI 30.79 kg/m2  SpO2 95%  BP 124/67 mmHg  Pulse 88  Ht 5\' 5"  (1.651 m)  Wt 185 lb (83.915 kg)  BMI 30.79 kg/m2  SpO2 95%  General Appearance:    Alert, cooperative, no distress, appears stated age  Head:    Normocephalic, without obvious abnormality, atraumatic  Eyes:    PERRL, conjunctiva/corneas clear, EOM's intact,both eyes  Ears:    Normal TM's and external ear canals, both ears  Nose:   Nares normal, septum midline, mucosa normal, no drainage    or sinus tenderness  Throat:   Lips, mucosa, and tongue normal; teeth and gums normal  Neck:   Supple, symmetrical, trachea midline, no adenopathy;    thyroid:  no enlargement/tenderness/nodules; no carotid   bruit or JVD  Back:     Symmetric, no curvature, ROM normal, no CVA tenderness  Lungs:     Clear to auscultation bilaterally, respirations unlabored  Chest Wall:    No tenderness or deformity   Heart:    Regular rate and rhythm, S1 and S2 normal, no murmur, rub   or gallop  Breast Exam:    Not perfromed  Abdomen:     Soft, non-tender, bowel sounds active all four quadrants,    no masses, no organomegaly  Genitalia:    Not performed  Rectal:    Not performed  Extremities:   Extremities normal, atraumatic, no cyanosis or edema  Pulses:    2+ and symmetric all extremities  Skin:   Skin color, texture, turgor normal, no rashes or lesions  Lymph nodes:   Cervical, supraclavicular nodes normal  Neurologic:   CNII-XII intact, normal strength, sensation and reflexes    throughout       Assessment:     Medicare Annual Wellness Exam       Plan:     During the course of the visit the patient was educated and counseled about appropriate screening and preventive services including:    Pneumococcal vaccine   Screening mammography  Bone densitometry screening  Diet review for nutrition referral? Yes ____  Not Indicated _X_   Patient Instructions (the written plan) was given to the patient.  Medicare Attestation I have personally reviewed: The patient's medical and social history Their use of alcohol, tobacco or illicit drugs Their current medications and supplements The patient's functional ability including ADLs,fall risks, home safety risks, cognitive, and hearing and visual impairment Diet and physical activities Evidence for depression or mood disorders  The patient's weight, height, BMI, and visual acuity have been recorded in the chart.  I have made referrals, counseling, and provided education to the patient based on review of the above and I have provided the patient with a written personalized care plan for preventive services.     METHENEY,CATHERINE, MD   12/17/2013

## 2013-12-18 ENCOUNTER — Other Ambulatory Visit (HOSPITAL_BASED_OUTPATIENT_CLINIC_OR_DEPARTMENT_OTHER): Payer: Commercial Managed Care - HMO

## 2013-12-18 ENCOUNTER — Ambulatory Visit (HOSPITAL_BASED_OUTPATIENT_CLINIC_OR_DEPARTMENT_OTHER)
Admission: RE | Admit: 2013-12-18 | Discharge: 2013-12-18 | Disposition: A | Discharge status: Home / Self Care | Payer: Medicare PPO | Source: Ambulatory Visit | Attending: Family Medicine | Admitting: Family Medicine

## 2013-12-18 DIAGNOSIS — I6523 Occlusion and stenosis of bilateral carotid arteries: Secondary | ICD-10-CM | POA: Diagnosis not present

## 2013-12-18 DIAGNOSIS — I6529 Occlusion and stenosis of unspecified carotid artery: Secondary | ICD-10-CM

## 2013-12-20 ENCOUNTER — Encounter: Payer: Self-pay | Admitting: Family Medicine

## 2013-12-26 ENCOUNTER — Ambulatory Visit (HOSPITAL_COMMUNITY)
Admission: RE | Admit: 2013-12-26 | Discharge: 2013-12-26 | Disposition: A | Discharge status: Home / Self Care | Payer: Commercial Managed Care - HMO | Source: Ambulatory Visit | Attending: Family Medicine | Admitting: Family Medicine

## 2013-12-26 ENCOUNTER — Other Ambulatory Visit: Payer: Self-pay | Admitting: Family Medicine

## 2013-12-26 DIAGNOSIS — Z1231 Encounter for screening mammogram for malignant neoplasm of breast: Secondary | ICD-10-CM | POA: Insufficient documentation

## 2013-12-27 ENCOUNTER — Encounter: Payer: Self-pay | Admitting: Family Medicine

## 2013-12-28 ENCOUNTER — Encounter: Payer: Self-pay | Admitting: Family Medicine

## 2013-12-30 ENCOUNTER — Encounter: Payer: Self-pay | Admitting: Family Medicine

## 2013-12-30 NOTE — Telephone Encounter (Signed)
Please Byron, [phone], [facility] and find out what they have an order for.  I didn't send a home health order that I know of.

## 2014-01-14 ENCOUNTER — Encounter: Payer: Self-pay | Admitting: Family Medicine

## 2014-01-14 MED ORDER — HYDROCODONE-HOMATROPINE 5-1.5 MG/5ML PO SYRP: 5.0000 mL | ORAL_SOLUTION | Freq: Every evening | ORAL | Status: DC | PRN

## 2014-01-15 ENCOUNTER — Ambulatory Visit (INDEPENDENT_AMBULATORY_CARE_PROVIDER_SITE_OTHER): Payer: Medicare PPO

## 2014-01-15 ENCOUNTER — Encounter: Payer: Self-pay | Admitting: Family Medicine

## 2014-01-15 ENCOUNTER — Ambulatory Visit (INDEPENDENT_AMBULATORY_CARE_PROVIDER_SITE_OTHER): Payer: Commercial Managed Care - HMO | Admitting: Family Medicine

## 2014-01-15 VITALS — BP 108/60 | HR 90 | Temp 98.4°F | Wt 187.0 lb

## 2014-01-15 DIAGNOSIS — Z780 Asymptomatic menopausal state: Secondary | ICD-10-CM

## 2014-01-15 DIAGNOSIS — J019 Acute sinusitis, unspecified: Secondary | ICD-10-CM

## 2014-01-15 DIAGNOSIS — M859 Disorder of bone density and structure, unspecified: Secondary | ICD-10-CM

## 2014-01-15 MED ORDER — AMOXICILLIN-POT CLAVULANATE 875-125 MG PO TABS: 1.0000 | ORAL_TABLET | Freq: Two times a day (BID) | ORAL | Status: DC

## 2014-01-15 NOTE — Progress Notes (Signed)
   Subjective:    Patient ID: Cheryl [name], female    DOB: [birth year], 73 y.o.   MRN: [id number]  HPI 11 days of cough and sinus congestion. Feels like her head is big and swollen. No tooth pain.  No facial pain.  Some pressure in both ears.  Clear sputum.  No fever, chills, or sweats.  + nausea.  No diarrhea. Lots of excess post nasal drip. Noticed some wheezing in her chest over the weekend. HA every day from the pressure. Mild ST initially but that is better.    Review of Systems     Objective:   Physical Exam  Constitutional: She is oriented to person, place, and time. She appears well-developed and well-nourished.  HENT:  Head: Normocephalic and atraumatic.  Right Ear: External ear normal.  Left Ear: External ear normal.  Nose: Nose normal.  Mouth/Throat: Oropharynx is clear and moist.  TMs and canals are clear.   Eyes: Conjunctivae and EOM are normal. Pupils are equal, round, and reactive to light.  Neck: Neck supple. No thyromegaly present.  Cardiovascular: Normal rate, regular rhythm and normal heart sounds.   Pulmonary/Chest: Effort normal and breath sounds normal. She has no wheezes.  Lymphadenopathy:    She has no cervical adenopathy.  Neurological: She is alert and oriented to person, place, and time.  Skin: Skin is warm and dry.  Psychiatric: She has a normal mood and affect.          Assessment & Plan:  Acute sinusitis-will treat with Augmentin. Call not significantly better in one week. Make sure staying well hydrated. Okay to continue over-the-counter cough and cold medications.

## 2014-01-15 NOTE — Patient Instructions (Signed)

## 2014-02-03 ENCOUNTER — Encounter: Payer: Self-pay | Admitting: Sports Medicine

## 2014-02-03 ENCOUNTER — Ambulatory Visit (INDEPENDENT_AMBULATORY_CARE_PROVIDER_SITE_OTHER): Payer: Commercial Managed Care - HMO | Admitting: Sports Medicine

## 2014-02-03 VITALS — BP 106/60 | HR 92 | Wt 190.0 lb

## 2014-02-03 DIAGNOSIS — B078 Other viral warts: Secondary | ICD-10-CM

## 2014-02-03 DIAGNOSIS — M7711 Lateral epicondylitis, right elbow: Secondary | ICD-10-CM

## 2014-02-03 MED ORDER — HYDROCODONE-ACETAMINOPHEN 5-325 MG PO TABS: 1.0000 | ORAL_TABLET | Freq: Three times a day (TID) | ORAL | Status: DC | PRN

## 2014-02-03 NOTE — Assessment & Plan Note (Signed)
Cryotherapy 1 on left hand.

## 2014-02-03 NOTE — Assessment & Plan Note (Signed)
Persistent pain and weakness despite 2 months of icing and ibuprofen. Ultrasound guided percutaneous needle tenotomy. Strap with compressive dressing. Home rehabilitation exercises given. Vicodin for postprocedural pain.

## 2014-02-03 NOTE — Progress Notes (Signed)
  Subjective:    CC:  Right elbow pain  HPI: For 2 months now and has had pain that she localizes over the lateral epicondyle of her right elbow, pain is severe, persistent, she has tried oral NSAIDs as well as icing without any improvement. No radiation.  She also has a wart on her left hand that she would like burn/frozen off.  Past medical history, Surgical history, Family history not pertinant except as noted below, Social history, Allergies, and medications have been entered into the medical record, reviewed, and no changes needed.   Review of Systems: No fevers, chills, night sweats, weight loss, chest pain, or shortness of breath.   Objective:    General: Well Developed, well nourished, and in no acute distress.  Neuro: Alert and oriented x3, extra-ocular muscles intact, sensation grossly intact.  HEENT: Normocephalic, atraumatic, pupils equal round reactive to light, neck supple, no masses, no lymphadenopathy, thyroid nonpalpable.  Skin: Warm and dry, no rashes. Cardiac: Regular rate and rhythm, no murmurs rubs or gallops, no lower extremity edema.  Respiratory: Clear to auscultation bilaterally. Not using accessory muscles, speaking in full sentences. Right Elbow: Unremarkable to inspection. Range of motion full pronation, supination, flexion, extension. Strength is full to all of the above directions Stable to varus, valgus stress. Negative moving valgus stress test. Tender to palpation over the lateral epicondyle with reproduction of pain with resisted wrist extension and extension of the middle finger. Ulnar nerve does not sublux. Negative cubital tunnel Tinel's.  Procedure: Real-time Ultrasound Guided Injection of right common extensor tendon percutaneous tenotomy Device: GE Logiq E  Verbal informed consent obtained.  Time-out conducted.  Noted no overlying erythema, induration, or other signs of local infection.  Skin prepped in a sterile fashion.  Local anesthesia:  Topical Ethyl chloride.  With sterile technique and under real time ultrasound guidance:  Noted hyperechoic structure within the proximal substance of the common extensor tendon suggestive of an old avulsion fracture, 25-gauge needle advanced and I injected medication around the common extensor tendon, I then guided needle into the calcific deposit and made approximately 50 masses while injecting medication, a total of 1 mL kenalog 40, 4 mL lidocaine was used. Completed without difficulty  Pain immediately resolved suggesting accurate placement of the medication.  Advised to call if fevers/chills, erythema, induration, drainage, or persistent bleeding.  Images permanently stored and available for review in the ultrasound unit.  Impression: Technically successful ultrasound guided injection.  The elbow was then strapped with compressive dressing.  Procedure:  Cryodestruction of left hand wart Consent obtained and verified. Time-out conducted. Noted no overlying erythema, induration, or other signs of local infection. Completed without difficulty using Cryo-Gun. Advised to call if fevers/chills, erythema, induration, drainage, or persistent bleeding.  Impression and Recommendations:

## 2014-02-06 ENCOUNTER — Other Ambulatory Visit: Payer: Self-pay | Admitting: Family Medicine

## 2014-02-06 ENCOUNTER — Encounter: Payer: Self-pay | Admitting: Sports Medicine

## 2014-02-06 ENCOUNTER — Encounter: Payer: Self-pay | Admitting: Family Medicine

## 2014-02-06 DIAGNOSIS — N289 Disorder of kidney and ureter, unspecified: Secondary | ICD-10-CM

## 2014-02-06 DIAGNOSIS — R3129 Other microscopic hematuria: Secondary | ICD-10-CM

## 2014-02-09 ENCOUNTER — Other Ambulatory Visit: Payer: Self-pay | Admitting: Family Medicine

## 2014-02-12 ENCOUNTER — Ambulatory Visit (INDEPENDENT_AMBULATORY_CARE_PROVIDER_SITE_OTHER): Payer: Commercial Managed Care - HMO | Admitting: Physician Assistant

## 2014-02-12 ENCOUNTER — Encounter: Payer: Self-pay | Admitting: Family Medicine

## 2014-02-12 VITALS — BP 112/62 | HR 85 | Temp 97.9°F

## 2014-02-12 DIAGNOSIS — N3001 Acute cystitis with hematuria: Secondary | ICD-10-CM

## 2014-02-12 DIAGNOSIS — R300 Dysuria: Secondary | ICD-10-CM

## 2014-02-12 LAB — POCT URINALYSIS DIPSTICK
Glucose, UA: 100
Protein, UA: 30
Spec Grav, UA: <=1.005
Urobilinogen, UA: 2
pH, UA: 5.5

## 2014-02-12 MED ORDER — CIPROFLOXACIN HCL 500 MG PO TABS: 500.0000 mg | ORAL_TABLET | Freq: Two times a day (BID) | ORAL | Status: DC

## 2014-02-12 NOTE — Progress Notes (Signed)
   Subjective:    Patient ID: Cheryl [name], female    DOB: 20-Sep-1940, 74 y.o.   MRN: [id number]  HPI  Kahla complains of painful urination for one day. She is recurrent UTI's. Patient is taking AZO. Denies fever, chills or sweats.   Review of Systems     Objective:   Physical Exam        Assessment & Plan:  Dysuria- AZO interfered with the urine dip.  Urine culture pending. .. Results for orders placed or performed in visit on 02/12/14  POCT Urinalysis Dipstick  Result Value Ref Range   Color, UA red    Clarity, UA clear    Glucose, UA 100    Bilirubin, UA small    Ketones, UA trace    Spec Grav, UA <=1.005    Blood, UA trace-lysed    pH, UA 5.5    Protein, UA 30    Urobilinogen, UA 2.0    Nitrite, UA postive    Leukocytes, UA large (3+)    Sent cipro for patient to start for 3 days. Iran Planas PA-C

## 2014-02-14 LAB — URINE CULTURE: Colony Count: 50000

## 2014-02-28 ENCOUNTER — Ambulatory Visit: Payer: Commercial Managed Care - HMO | Admitting: Sports Medicine

## 2014-03-03 ENCOUNTER — Ambulatory Visit: Payer: Commercial Managed Care - HMO | Admitting: Sports Medicine

## 2014-03-07 ENCOUNTER — Encounter: Payer: Self-pay | Admitting: Family Medicine

## 2014-03-07 DIAGNOSIS — N3090 Cystitis, unspecified without hematuria: Secondary | ICD-10-CM

## 2014-04-10 ENCOUNTER — Other Ambulatory Visit: Payer: Self-pay | Admitting: Family Medicine

## 2014-04-30 ENCOUNTER — Other Ambulatory Visit: Payer: Self-pay | Admitting: Family Medicine

## 2014-05-07 ENCOUNTER — Encounter: Payer: Self-pay | Admitting: Family Medicine

## 2014-05-12 ENCOUNTER — Other Ambulatory Visit: Payer: Self-pay | Admitting: Family Medicine

## 2014-05-14 ENCOUNTER — Telehealth: Payer: Self-pay | Admitting: *Deleted

## 2014-05-14 NOTE — Telephone Encounter (Signed)
Dr Madilyn Fireman patient called requesting a another referral for  pain mgmt Dr Maryjean Ka. She says this is an on-going issue that you are aware of. Says she she can only see pain mgmt every 90- days. And would like the referral every 90 days x 2.

## 2014-05-14 NOTE — Telephone Encounter (Signed)
Left a message for patient to call back. 

## 2014-05-16 NOTE — Telephone Encounter (Signed)
Ok to place the refrral and fax to Dr. Maryjean Ka

## 2014-06-05 ENCOUNTER — Other Ambulatory Visit: Payer: Self-pay | Admitting: *Deleted

## 2014-06-05 DIAGNOSIS — M48061 Spinal stenosis, lumbar region without neurogenic claudication: Secondary | ICD-10-CM

## 2014-07-08 ENCOUNTER — Ambulatory Visit (INDEPENDENT_AMBULATORY_CARE_PROVIDER_SITE_OTHER): Payer: Commercial Managed Care - HMO | Admitting: Family Medicine

## 2014-07-08 ENCOUNTER — Encounter: Payer: Self-pay | Admitting: Family Medicine

## 2014-07-08 VITALS — BP 106/58 | HR 81 | Wt 190.0 lb

## 2014-07-08 DIAGNOSIS — G8929 Other chronic pain: Secondary | ICD-10-CM | POA: Diagnosis not present

## 2014-07-08 DIAGNOSIS — M4806 Spinal stenosis, lumbar region: Secondary | ICD-10-CM

## 2014-07-08 DIAGNOSIS — K5901 Slow transit constipation: Secondary | ICD-10-CM

## 2014-07-08 DIAGNOSIS — M48061 Spinal stenosis, lumbar region without neurogenic claudication: Secondary | ICD-10-CM

## 2014-07-08 DIAGNOSIS — M545 Low back pain, unspecified: Secondary | ICD-10-CM

## 2014-07-08 MED ORDER — PEG 3350-KCL-NA BICARB-NACL 420 G PO SOLR: 4000.0000 mL | Freq: Once | ORAL | Status: DC

## 2014-07-08 NOTE — Progress Notes (Signed)
   Subjective:    Patient ID: Cheryl [name], female    DOB: 11-01-1940, 74 y.o.   MRN: [id number]  HPI Has had back pain for years but it has changed in the last couple of months. Pain is below the bra strap.  Says ti feels like a sprain.   Says had an injection from her pain management.  Says it only worked for 30 days. She has been walking 2 miles per day and really doesn't want to have to stop because of pain. She says she does have to take a tramadol before her walk or she can't finish the walk..   Constipation - she has had to strain and that hurts her back. She says has been lifelong.  She is on tramadol as well.  Tried amitiza before. Took 3 days to work and then continued medication and still had no BM after 3-4 more days so finally stopped it.  Says miralax doesn't work well for her.    Review of Systems     Objective:   Physical Exam  Constitutional: She appears well-developed and well-nourished.  Musculoskeletal:  Decreased flexion and extension. Decreased rotation right and left. More limited with rotation to the right compared to the left. Negative straight leg raise bilaterally. Hip, knee, ankle strength is 5 out of 5 bilaterally. Patellar reflex 2+ bilaterally. Nontender over the lumbar spine.  Skin: Skin is warm and dry.  Psychiatric: She has a normal mood and affect. Her behavior is normal.          Assessment & Plan:  Chronic constipation - Discussed options. She could certainly do a cleanout first , before starting a daily regimen. She could do magnesium citrate over-the-counter. Order prescription like GoLYTELY. Sent prescription to her pharmacy. She can even do MiraLAX twice a day until she has a loose bowel movement. Then I would recommend that she get on something daily such as either Amitiza, Linzess, or even MiraLAX daily.  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 I think she might  be a good candidate for radioablation but her pain management specialist would prefer that she go ahead and have an MRI ordered. She prefers to have dystonic [facility].

## 2014-07-21 ENCOUNTER — Ambulatory Visit
Admission: RE | Admit: 2014-07-21 | Discharge: 2014-07-21 | Disposition: A | Discharge status: Home / Self Care | Payer: Commercial Managed Care - HMO | Source: Ambulatory Visit | Attending: Family Medicine | Admitting: Family Medicine

## 2014-07-21 DIAGNOSIS — M545 Low back pain, unspecified: Secondary | ICD-10-CM

## 2014-07-21 DIAGNOSIS — G8929 Other chronic pain: Secondary | ICD-10-CM

## 2014-07-21 DIAGNOSIS — M48061 Spinal stenosis, lumbar region without neurogenic claudication: Secondary | ICD-10-CM

## 2014-08-07 ENCOUNTER — Encounter: Payer: Self-pay | Admitting: Family Medicine

## 2014-08-18 ENCOUNTER — Encounter: Payer: Self-pay | Admitting: Family Medicine

## 2014-08-18 ENCOUNTER — Ambulatory Visit (INDEPENDENT_AMBULATORY_CARE_PROVIDER_SITE_OTHER): Payer: Commercial Managed Care - HMO | Admitting: Family Medicine

## 2014-08-18 VITALS — BP 100/60 | HR 88 | Ht 65.0 in | Wt 190.0 lb

## 2014-08-18 DIAGNOSIS — M25521 Pain in right elbow: Secondary | ICD-10-CM

## 2014-08-18 DIAGNOSIS — M7041 Prepatellar bursitis, right knee: Secondary | ICD-10-CM | POA: Diagnosis not present

## 2014-08-18 DIAGNOSIS — M25529 Pain in unspecified elbow: Secondary | ICD-10-CM | POA: Insufficient documentation

## 2014-08-18 NOTE — Assessment & Plan Note (Signed)
Traumatic prepatellar bursitis. Improving with conservative measures. Continue ice and rest. Dispense diclofenac oil sample. Patient declined prescription for diclofenac topical. Use over-the-counter Aspercreme as needed.

## 2014-08-18 NOTE — Assessment & Plan Note (Signed)
Mild improving posttraumatic elbow pain. Very doubtful for fractures. Patient declined x-ray today. Plan for conservative measures. Follow-up as needed.

## 2014-08-18 NOTE — Progress Notes (Signed)
Cheryl [name] is a 74 y.o. female who presents to [facility]  today for right knee and elbow pain. Patient tripped and fell 2 weeks ago landing on her right knee and right wrist. She landed on concrete. She denies any syncope. She notes right anterior knee swelling and mild pain. The knee swelling is improving dramatically with ice and rest. The pain is very minimal. The only time her knee hurts is when she kneels on it. She notes no chest pain but has mild right elbow pain. No swelling. The pain is improving. No medications tried no fevers chills nausea vomiting or diarrhea.   Past Medical History  Diagnosis Date  . Constipation   . Arthritis   . Depression   . GERD (gastroesophageal reflux disease)   . Hyperlipidemia   . Osteopenia   . Bronchitis     saw md 10-05-10  . Uterine cancer 1973  . Diverticulosis   . Lung nodules   . Irritable bowel syndrome   . Bulging discs   . Spinal stenosis   . Bone spur   . Sigmoid diverticulitis   . Internal hemorrhoids   . Migraine headache   . Adenomatous colon polyp 2012  . Cyst, kidney, acquired   . Pancreatitis   . Hepatitis 2012   Past Surgical History  Procedure Laterality Date  . Appendectomy  1966  . Vaginal hysterectomy  1973  . Rotator cuff repair  2009    left  . Rectocele repair  2011  . Tonsillectomy    . Cataracts      bilateral  . Eus  02/10/2011    Procedure: UPPER ENDOSCOPIC ULTRASOUND (EUS) RADIAL;  Surgeon: Owens Loffler, MD;  Location: [facility];  Service: Endoscopy;  Laterality: N/A;  radial linear    History  Substance Use Topics  . Smoking status: Former Smoker    Quit date: 02/07/1993  . Smokeless tobacco: Never Used  . Alcohol Use: No   ROS as above Medications: Current Outpatient Prescriptions  Medication Sig Dispense Refill  . ALPRAZolam (XANAX) 0.5 MG tablet Take 1 tablet (0.5 mg total) by mouth at bedtime as needed for anxiety. 15 tablet 1  . aspirin 81 MG tablet  Take 81 mg by mouth daily.      . Cholecalciferol (VITAMIN D) 1000 UNITS capsule Take 1,000 Units by mouth daily.      . ciprofloxacin (CIPRO) 500 MG tablet Take 1 tablet (500 mg total) by mouth 2 (two) times daily. 6 tablet 0  . fluticasone (FLONASE) 50 MCG/ACT nasal spray USE 2 SPRAYS IN EACH NOSTRIL EVERY DAY 48 g 3  . HYDROcodone-acetaminophen (NORCO/VICODIN) 5-325 MG per tablet Take 1 tablet by mouth every 8 (eight) hours as needed for moderate pain. 40 tablet 0  . MAGNESIUM PO Take by mouth.    . Melatonin 3 MG CAPS Take 1 capsule by mouth at bedtime.    Marland Kitchen omeprazole (PRILOSEC) 20 MG capsule TAKE 1 CAPSULE TWICE DAILY BEFORE A MEAL 180 capsule 3  . PARoxetine (PAXIL) 20 MG tablet TAKE 1 TABLET EVERY MORNING 90 tablet 3  . Phenazopyridine HCl (AZO STANDARD MAXIMUM STRENGTH PO) Take 1 tablet by mouth daily as needed (pain/burning).    . polyethylene glycol-electrolytes (NULYTELY/GOLYTELY) 420 G solution Take 4,000 mLs by mouth once. 4000 mL 0  . PREMARIN vaginal cream   11  . RESTASIS 0.05 % ophthalmic emulsion Place 1 drop into both eyes 2 (two) times daily.     Marland Kitchen  simvastatin (ZOCOR) 40 MG tablet TAKE 1/2 TABLET (20MG ) AT BEDTIME 45 tablet 2  . traMADol (ULTRAM) 50 MG tablet TAKE 1 TABLET TWICE DAILY AS NEEDED 180 tablet 1   No current facility-administered medications for this visit.   Allergies  Allergen Reactions  . Codeine Sulfate     REACTION: pruritis  . Contrast Media [Iodinated Diagnostic Agents] Itching    Multilance.   . Fluoxetine Hcl     REACTION: itching,SOB  . Sulfonamide Derivatives     REACTION: Nausea     Exam:  BP 100/60 mmHg  Pulse 88  Ht 5\' 5"  (1.651 m)  Wt 190 lb (86.183 kg)  BMI 31.62 kg/m2  Gen: Well NAD HEENT: EOMI,  MMM Lungs: Normal work of breathing. CTABL Heart: RRR no MRG Abd: NABS, Soft. Nondistended, Nontender Exts: Brisk capillary refill, warm and well perfused.  MSK: Right shoulder nontender normal motion negative impingement testing  normal strength Elbow normal-appearing minimally tender lateral elbow. Normal motion and strength. Right knee: Saline abrasions anterior knee. Minimal swelling prepatellar bursa. No palpable squeak. Nontender. Intact extension and flexion strength normal ligamentous exam. Normal gait  No results found for this or any previous visit (from the past 24 hour(s)). No results found.   Please see individual assessment and plan sections.

## 2014-08-18 NOTE — Patient Instructions (Signed)
Thank you for coming in today. Try using the pen said ointment or over-the-counter Aspercreme. Use ice as well. Return if not better.

## 2014-08-22 ENCOUNTER — Encounter: Payer: Self-pay | Admitting: Family Medicine

## 2014-09-12 ENCOUNTER — Other Ambulatory Visit: Payer: Self-pay | Admitting: Family Medicine

## 2014-11-06 ENCOUNTER — Other Ambulatory Visit: Payer: Self-pay | Admitting: Family Medicine

## 2014-11-13 ENCOUNTER — Telehealth: Payer: Self-pay | Admitting: *Deleted

## 2014-11-13 DIAGNOSIS — M48061 Spinal stenosis, lumbar region without neurogenic claudication: Secondary | ICD-10-CM

## 2014-11-13 NOTE — Telephone Encounter (Signed)
Pt will need a referral to be placed for Dr. Maryjean Ka (neuro) and the Dx code is M48.06. Please fax to [phone] with ov notes.Elouise Munroe'

## 2014-11-26 ENCOUNTER — Other Ambulatory Visit: Payer: Self-pay

## 2014-11-26 DIAGNOSIS — Z1231 Encounter for screening mammogram for malignant neoplasm of breast: Secondary | ICD-10-CM

## 2014-12-19 ENCOUNTER — Encounter: Payer: Self-pay | Admitting: Family Medicine

## 2014-12-19 ENCOUNTER — Ambulatory Visit (INDEPENDENT_AMBULATORY_CARE_PROVIDER_SITE_OTHER): Payer: Commercial Managed Care - HMO | Admitting: Family Medicine

## 2014-12-19 VITALS — BP 119/63 | HR 76 | Temp 98.4°F | Resp 18 | Wt 175.4 lb

## 2014-12-19 DIAGNOSIS — Z0000 Encounter for general adult medical examination without abnormal findings: Secondary | ICD-10-CM | POA: Diagnosis not present

## 2014-12-19 DIAGNOSIS — L989 Disorder of the skin and subcutaneous tissue, unspecified: Secondary | ICD-10-CM | POA: Diagnosis not present

## 2014-12-19 DIAGNOSIS — Z23 Encounter for immunization: Secondary | ICD-10-CM | POA: Diagnosis not present

## 2014-12-19 DIAGNOSIS — E785 Hyperlipidemia, unspecified: Secondary | ICD-10-CM

## 2014-12-19 DIAGNOSIS — L820 Inflamed seborrheic keratosis: Secondary | ICD-10-CM

## 2014-12-19 DIAGNOSIS — K5901 Slow transit constipation: Secondary | ICD-10-CM | POA: Diagnosis not present

## 2014-12-19 LAB — LIPID PANEL
Cholesterol: 176 mg/dL (ref 125–200)
HDL: 51 mg/dL (ref >=46)
LDL Cholesterol: 103 mg/dL (ref <130)
Total CHOL/HDL Ratio: 3.5 Ratio (ref <=5.0)
Triglycerides: 109 mg/dL (ref <150)
VLDL: 22 mg/dL (ref <30)

## 2014-12-19 LAB — COMPLETE METABOLIC PANEL WITH GFR
ALT: 22 U/L (ref 6–29)
AST: 22 U/L (ref 10–35)
Albumin: 4.3 g/dL (ref 3.6–5.1)
Alkaline Phosphatase: 68 U/L (ref 33–130)
BUN: 19 mg/dL (ref 7–25)
CO2: 28 mmol/L (ref 20–31)
Calcium: 9.9 mg/dL (ref 8.6–10.4)
Chloride: 103 mmol/L (ref 98–110)
Creat: 0.88 mg/dL (ref 0.60–0.93)
GFR, Est African American: 76 mL/min (ref >=60)
GFR, Est Non African American: 66 mL/min (ref >=60)
Glucose, Bld: 87 mg/dL (ref 65–99)
Potassium: 4.5 mmol/L (ref 3.5–5.3)
Sodium: 141 mmol/L (ref 135–146)
Total Bilirubin: 0.5 mg/dL (ref 0.2–1.2)
Total Protein: 7 g/dL (ref 6.1–8.1)

## 2014-12-19 LAB — TSH: TSH: 2.287 u[IU]/mL (ref 0.350–4.500)

## 2014-12-19 MED ORDER — ALPRAZOLAM 0.5 MG PO TABS: 0.5000 mg | ORAL_TABLET | Freq: Every evening | ORAL | Status: DC | PRN

## 2014-12-19 NOTE — Patient Instructions (Signed)
Recommend MiraLAX daily to help with chronic constipation. If not effective than okay to increase to twice a day. Should not cause any abdominal cramping.

## 2014-12-19 NOTE — Progress Notes (Signed)
Subjective:    Cheryl [name] is a 74 y.o. female who presents for Medicare Annual/Subsequent preventive examination.  Preventive Screening-Counseling & Management  Tobacco History  Smoking status  . Former Smoker  . Quit date: 02/07/1993  Smokeless tobacco  . Never Used     Problems Prior to Visit 1.   Current Problems (verified) Patient Active Problem List   Diagnosis Date Noted  . Prepatellar bursitis of right knee 08/18/2014  . Elbow pain 08/18/2014  . Right lateral epicondylitis 02/03/2014  . Common wart 02/03/2014  . Hyperlipidemia 12/17/2013  . Osteoarthritis of left knee 07/05/2013  . Obesity 03/10/2011  . Pancreatitis 02/10/2011  . Gastritis 02/10/2011  . Dyspepsia 02/10/2011  . Pulmonary nodule 12/10/2010  . PALPITATIONS 02/11/2010  . OSTEOPENIA 11/27/2009  . CONSTIPATION, CHRONIC 08/15/2008  . ONYCHOMYCOSIS 07/08/2008  . INSOMNIA 07/08/2008  . NECK PAIN 04/11/2008  . Disorder of kidney and ureter 01/21/2008  . HEMORRHOIDS, INTERNAL 01/16/2008  . DIVERTICULOSIS, COLON 01/16/2008  . POSTMENOPAUSAL STATUS 12/05/2007  . MICROSCOPIC HEMATURIA 06/05/2007  . PULMONARY NODULE 12/22/2005  . STENOSIS, LUMBAR SPINE 12/22/2005  . ANXIETY 11/15/2005  . IRRITABLE BOWEL SYNDROME 11/15/2005  . ARTHRITIS 11/15/2005    Medications Prior to Visit Current Outpatient Prescriptions on File Prior to Visit  Medication Sig Dispense Refill  . ALPRAZolam (XANAX) 0.5 MG tablet Take 1 tablet (0.5 mg total) by mouth at bedtime as needed for anxiety. 15 tablet 1  . aspirin 81 MG tablet Take 81 mg by mouth daily.      . Cholecalciferol (VITAMIN D) 1000 UNITS capsule Take 1,000 Units by mouth daily.      . ciprofloxacin (CIPRO) 500 MG tablet Take 1 tablet (500 mg total) by mouth 2 (two) times daily. 6 tablet 0  . fluticasone (FLONASE) 50 MCG/ACT nasal spray USE 2 SPRAYS IN EACH NOSTRIL EVERY DAY 48 g 3  . HYDROcodone-acetaminophen (NORCO/VICODIN) 5-325 MG per tablet Take 1 tablet  by mouth every 8 (eight) hours as needed for moderate pain. 40 tablet 0  . MAGNESIUM PO Take by mouth.    . Melatonin 3 MG CAPS Take 1 capsule by mouth at bedtime.    Marland Kitchen omeprazole (PRILOSEC) 20 MG capsule TAKE 1 CAPSULE TWICE DAILY BEFORE A MEAL 180 capsule 3  . PARoxetine (PAXIL) 20 MG tablet TAKE 1 TABLET EVERY MORNING 90 tablet 3  . Phenazopyridine HCl (AZO STANDARD MAXIMUM STRENGTH PO) Take 1 tablet by mouth daily as needed (pain/burning).    . polyethylene glycol-electrolytes (NULYTELY/GOLYTELY) 420 G solution Take 4,000 mLs by mouth once. 4000 mL 0  . PREMARIN vaginal cream   11  . RESTASIS 0.05 % ophthalmic emulsion Place 1 drop into both eyes 2 (two) times daily.     . simvastatin (ZOCOR) 40 MG tablet TAKE 1/2 TABLET (20MG ) AT BEDTIME 45 tablet 2  . traMADol (ULTRAM) 50 MG tablet TAKE 1 TABLET TWICE DAILY AS NEEDED 180 tablet 1   No current facility-administered medications on file prior to visit.    Current Medications (verified) Current Outpatient Prescriptions  Medication Sig Dispense Refill  . ALPRAZolam (XANAX) 0.5 MG tablet Take 1 tablet (0.5 mg total) by mouth at bedtime as needed for anxiety. 15 tablet 1  . aspirin 81 MG tablet Take 81 mg by mouth daily.      . Cholecalciferol (VITAMIN D) 1000 UNITS capsule Take 1,000 Units by mouth daily.      . ciprofloxacin (CIPRO) 500 MG tablet Take 1 tablet (500 mg total)  by mouth 2 (two) times daily. 6 tablet 0  . fluticasone (FLONASE) 50 MCG/ACT nasal spray USE 2 SPRAYS IN EACH NOSTRIL EVERY DAY 48 g 3  . HYDROcodone-acetaminophen (NORCO/VICODIN) 5-325 MG per tablet Take 1 tablet by mouth every 8 (eight) hours as needed for moderate pain. 40 tablet 0  . MAGNESIUM PO Take by mouth.    . Melatonin 3 MG CAPS Take 1 capsule by mouth at bedtime.    Marland Kitchen omeprazole (PRILOSEC) 20 MG capsule TAKE 1 CAPSULE TWICE DAILY BEFORE A MEAL 180 capsule 3  . PARoxetine (PAXIL) 20 MG tablet TAKE 1 TABLET EVERY MORNING 90 tablet 3  . Phenazopyridine HCl  (AZO STANDARD MAXIMUM STRENGTH PO) Take 1 tablet by mouth daily as needed (pain/burning).    . polyethylene glycol-electrolytes (NULYTELY/GOLYTELY) 420 G solution Take 4,000 mLs by mouth once. 4000 mL 0  . PREMARIN vaginal cream   11  . RESTASIS 0.05 % ophthalmic emulsion Place 1 drop into both eyes 2 (two) times daily.     . simvastatin (ZOCOR) 40 MG tablet TAKE 1/2 TABLET (20MG ) AT BEDTIME 45 tablet 2  . traMADol (ULTRAM) 50 MG tablet TAKE 1 TABLET TWICE DAILY AS NEEDED 180 tablet 1   No current facility-administered medications for this visit.     Allergies (verified) Codeine sulfate; Contrast media; Fluoxetine hcl; and Sulfonamide derivatives   PAST HISTORY  Family History Family History  Problem Relation Age of Onset  . Alcohol abuse Father   . Lung cancer Father     smoker  . Kidney failure Mother     med induced  . Rheum arthritis Mother   . Depression Brother   . Heart disease      Paternal family   . Heart failure Daughter   . Lung cancer Son   . Crohn's disease Brother   . Alcohol abuse Brother   . HIV Son   . Malignant hyperthermia Neg Hx     Social History Social History  Substance Use Topics  . Smoking status: Former Smoker    Quit date: 02/07/1993  . Smokeless tobacco: Never Used  . Alcohol Use: No     Are there smokers in your home (other than you)? No  Risk Factors Current exercise habits: walks 1-2 miles per day   Dietary issues discussed: none, feels like she does well with fiber foods.    Cardiac risk factors: advanced age (older than 68 for men, 87 for women).  Depression Screen (Note: if answer to either of the following is "Yes", a more complete depression screening is indicated)   Over the past two weeks, have you felt down, depressed or hopeless? No  Over the past two weeks, have you felt little interest or pleasure in doing things? No  Have you lost interest or pleasure in daily life? No  Do you often feel hopeless? No  Do you cry  easily over simple problems? No  Activities of Daily Living In your present state of health, do you have any difficulty performing the following activities?:  Driving? No Managing money?  No Feeding yourself? No Getting from bed to chair? No   Climbing a flight of stairs? No Preparing food and eating?: No Bathing or showering? No Getting dressed: No Getting to the toilet? No Using the toilet:No Moving around from place to place: No In the past year have you fallen or had a near fall?:No   Hearing Difficulties: Yes Do you often ask people to speak up or  repeat themselves? Yes Do you experience ringing or noises in your ears? Yes Do you have difficulty understanding soft or whispered voices? Yes   Do you feel that you have a problem with memory? No  Do you often misplace items? No  Do you feel safe at home?  Yes  Cognitive Testing  Alert? Yes  Normal Appearance?Yes  Oriented to person? Yes  Place? Yes   Time? Yes  Recall of three objects?  Yes  Can perform simple calculations? Yes  Displays appropriate judgment?Yes  Can read the correct time from a watch face?Yes    6 CIT score of 2/28.     Advanced Directives have been discussed with the patient? Yes  List the Names of Other Physician/Practitioners you currently use: 1.  Dr. Calvert Cantor, ophthalmology #2 Dr. Scarlette Shorts, gastroenterology. #3 Dr. Clydell Hakim, pain management  Indicate any recent Medical Services you may have received from other than [facility] providers in the past year (date may be approximate).  Immunization History  Administered Date(s) Administered  . Influenza Split 12/20/2011  . Influenza Whole 11/08/2004, 11/08/2005, 11/24/2006, 11/12/2007, 12/15/2008, 11/27/2009  . Influenza,inj,Quad PF,36+ Mos 10/10/2012, 10/01/2013  . Pneumococcal Conjugate-13 12/17/2013  . Pneumococcal Polysaccharide-23 02/07/2005, 12/15/2010  . Td 02/07/2002  . Tdap 10/10/2012  . Zoster 11/27/2009    Screening  Tests Health Maintenance  Topic Date Due  . INFLUENZA VACCINE  09/08/2014  . COLONOSCOPY  10/07/2015  . MAMMOGRAM  12/27/2015  . TETANUS/TDAP  10/11/2022  . DEXA SCAN  Completed  . ZOSTAVAX  Completed  . PNA vac Low Risk Adult  Completed    All answers were reviewed with the patient and necessary referrals were made:  METHENEY,CATHERINE, MD   12/19/2014   History reviewed: allergies, current medications, past family history, past medical history, past social history, past surgical history and problem list  Review of Systems Pertinent items noted in HPI and remainder of comprehensive ROS otherwise negative.    Objective:     Vision by Snellen chart: eye exam scheduled.   There is no weight on file to calculate BMI. There were no vitals taken for this visit.  There were no vitals taken for this visit. General appearance: alert, cooperative and appears stated age Head: Normocephalic, without obvious abnormality, atraumatic Eyes: conj clear, EOMI, PEERLA Ears: normal TM's and external ear canals both ears Nose: Nares normal. Septum midline. Mucosa normal. No drainage or sinus tenderness. Throat: lips, mucosa, and tongue normal; teeth and gums normal Neck: no adenopathy, no carotid bruit, no JVD, supple, symmetrical, trachea midline and thyroid not enlarged, symmetric, no tenderness/mass/nodules Back: symmetric, no curvature. ROM normal. No CVA tenderness. Lungs: clear to auscultation bilaterally Heart: regular rate and rhythm, S1, S2 normal, no murmur, click, rub or gallop Abdomen: soft, non-tender; bowel sounds normal; no masses,  no organomegaly Extremities: extremities normal, atraumatic, no cyanosis or edema Pulses: 2+ and symmetric Skin: Skin color, texture, turgor normal. No rashes or lesions Lymph nodes: Cervical, supraclavicular, and axillary nodes normal. Neurologic: Alert and oriented X 3, normal strength and tone. Normal symmetric reflexes. Normal coordination and  gait     Assessment:     Medicare Wellness Exam       Plan:     During the course of the visit the patient was educated and counseled about appropriate screening and preventive services including:   Influenza vaccine  Due for CMP and lipids.  Eye exam scheduled. She has her mammo scheduled too.  Chronic constipation -  Constipation with blood. She really feels her bowels have really changed. She is due next year for colonoscopy.  Recommend miralx daily. She is dong a good job with fiber and water intake and daily exericise.   SEborrheic keratosis with warty appearance.- Cryotherapy performed. See note below.    Diet review for nutrition referral? Yes ____  Not Indicated _X___   Patient Instructions (the written plan) was given to the patient.  Medicare Attestation I have personally reviewed: The patient's medical and social history Their use of alcohol, tobacco or illicit drugs Their current medications and supplements The patient's functional ability including ADLs,fall risks, home safety risks, cognitive, and hearing and visual impairment Diet and physical activities Evidence for depression or mood disorders  The patient's weight, height, BMI, and visual acuity have been recorded in the chart.  I have made referrals, counseling, and provided education to the patient based on review of the above and I have provided the patient with a written personalized care plan for preventive services.     METHENEY,CATHERINE, MD   12/19/2014    Cryotherapy Procedure Note  Pre-operative Diagnosis: Seborrheic keratosis  Post-operative Diagnosis: same   Locations: Upper back, to the right.    Indications: irritation and tenderness, rubs on her clothes.   Anesthesia: not required    Procedure Details  Patient informed of risks (permanent scarring, infection, light or dark discoloration, bleeding, infection, weakness, numbness and recurrence of the lesion) and benefits of the  procedure and verbal informed consent obtained.  The areas are treated with liquid nitrogen therapy, frozen until ice ball extended 2  mm beyond lesion, allowed to thaw, and treated again. The patient tolerated procedure well.  The patient was instructed on post-op care, warned that there may be blister formation, redness and pain. Recommend OTC analgesia as needed for pain.  Condition: Stable  Complications: none.  Plan: 1. Instructed to keep the area dry and covered for 24-48h and clean thereafter. 2. Warning signs of infection were reviewed.   3. Recommended that the patient use OTC acetaminophen as needed for pain.  4. Return PRN.

## 2014-12-22 ENCOUNTER — Encounter: Payer: Self-pay | Admitting: Family Medicine

## 2014-12-23 ENCOUNTER — Telehealth: Payer: Self-pay | Admitting: *Deleted

## 2014-12-23 ENCOUNTER — Telehealth: Payer: Self-pay | Admitting: Family Medicine

## 2014-12-23 DIAGNOSIS — H04129 Dry eye syndrome of unspecified lacrimal gland: Secondary | ICD-10-CM

## 2014-12-23 MED ORDER — CIPROFLOXACIN HCL 500 MG PO TABS: 500.0000 mg | ORAL_TABLET | Freq: Two times a day (BID) | ORAL | Status: DC

## 2014-12-23 NOTE — Telephone Encounter (Signed)
Referral placed for Dr. Bing Plume. Pt has appt tomorrow.Cheryl [name]
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
## 2014-12-24 ENCOUNTER — Other Ambulatory Visit: Payer: Self-pay | Admitting: *Deleted

## 2014-12-24 ENCOUNTER — Telehealth: Payer: Self-pay

## 2014-12-24 DIAGNOSIS — Z1231 Encounter for screening mammogram for malignant neoplasm of breast: Secondary | ICD-10-CM

## 2014-12-24 MED ORDER — CIPROFLOXACIN HCL 500 MG PO TABS: 500.0000 mg | ORAL_TABLET | Freq: Two times a day (BID) | ORAL | Status: AC

## 2014-12-24 NOTE — Telephone Encounter (Signed)
Cipro went to [facility] and patient wanted it sent to [facility]. Cheryl [name]

## 2014-12-24 NOTE — Telephone Encounter (Signed)
Called pt and informed her of recommendations. Pt reports that she feels yucky she wanted to know if this was because of the UTI. I advised that it very well could be. I asked if she was running a fever or having any back or flank pain she denies any. Cheryl [name], Lahoma Crocker

## 2014-12-30 ENCOUNTER — Ambulatory Visit
Admission: RE | Admit: 2014-12-30 | Discharge: 2014-12-30 | Disposition: A | Discharge status: Home / Self Care | Payer: Commercial Managed Care - HMO | Source: Ambulatory Visit

## 2014-12-30 DIAGNOSIS — Z1231 Encounter for screening mammogram for malignant neoplasm of breast: Secondary | ICD-10-CM

## 2014-12-31 ENCOUNTER — Encounter: Payer: Self-pay | Admitting: Family Medicine

## 2014-12-31 DIAGNOSIS — Z23 Encounter for immunization: Secondary | ICD-10-CM

## 2015-02-11 ENCOUNTER — Ambulatory Visit (INDEPENDENT_AMBULATORY_CARE_PROVIDER_SITE_OTHER): Payer: Commercial Managed Care - HMO | Admitting: Family Medicine

## 2015-02-11 ENCOUNTER — Encounter: Payer: Self-pay | Admitting: Family Medicine

## 2015-02-11 VITALS — BP 118/78 | HR 80 | Temp 97.7°F | Wt 176.0 lb

## 2015-02-11 DIAGNOSIS — L600 Ingrowing nail: Secondary | ICD-10-CM

## 2015-02-11 NOTE — Progress Notes (Signed)
   Subjective:    Patient ID: Cheryl [name], female    DOB: Jun 08, 1940, 75 y.o.   MRN: [id number]  HPI here today for removal of left great toenail. She's been dealing with an ingrown nail and pain.  She's had the right great toenail removed before but not the left. He is not had any fever or redness or drainage. He would like the entire nail removed and set of a partial removal.  Review of Systems     Objective:   Physical Exam  Constitutional: She is oriented to person, place, and time. She appears well-developed and well-nourished.  HENT:  Head: Normocephalic and atraumatic.  Eyes: Conjunctivae and EOM are normal.  Cardiovascular: Normal rate.   Pulmonary/Chest: Effort normal.  Neurological: She is alert and oriented to person, place, and time.  Skin: Skin is dry. No pallor.  irgrown toenail of the left toe.  Borders are cut sharply into the side of the nail.  No inudration or drianage.   Psychiatric: She has a normal mood and affect. Her behavior is normal.  Vitals reviewed.         Assessment & Plan:  Ingrown left great toenail-patient tolerated removal well. Follow-up wound care discussed and handout given. Call if any signs of infection or if not healing well. No cautery was performed.  Toenail Avulsion Procedure Note  Pre-operative Diagnosis: Left Ingrown Great toenail   Post-operative Diagnosis: Left Ingrown Great toenail  Indications: pain  Anesthesia: Lidocaine 1% without epinephrine without added sodium bicarbonate  Procedure Details  History of allergy to iodine: no  The risks (including bleeding and infection) and benefits of the  procedure and Verbal informed consent obtained.  After digital block anesthesia was obtained, a tourniquet was applied for hemostasis during the procedure.  After prepping with Betadine, the offending edge of the nail was freed from the nailbed and perionychium, and then split with scissors and removed with  forceps.  All visible  granulation tissue is debrided. Antibiotic and bulky dressing was applied.   Findings: Ingrown nail.   Complications: none.  Plan: 1. Soak the foot twice daily. Change dressing twice daily until healed over. 2. Warning signs of infection were reviewed.   3. Recommended that the patient use Vicodin as needed for pain.  4. Return PRN.

## 2015-02-11 NOTE — Patient Instructions (Signed)
Try to keep your foot propped up and elevated for the next couple of hours. If you do need to change the bandage because at 6 through that perfectly fine. Otherwise keep the bandage on until tomorrow morning.  At that point okay to remove the bandage and get the area wet in the shower.  Avoid any alcohol or peroxide.  Okay to soak in Epsom salts.  Pat dry and then apply a small dab of Vaseline and covering again. Encouraged to keep it covered for a couple of days. If you have any concerns about healing them please come in and let us take a look at it.

## 2015-02-19 ENCOUNTER — Institutional Professional Consult (permissible substitution): Payer: Commercial Managed Care - HMO | Admitting: Osteopathic Medicine

## 2015-02-23 ENCOUNTER — Encounter: Payer: Self-pay | Admitting: Family Medicine

## 2015-02-23 ENCOUNTER — Ambulatory Visit (INDEPENDENT_AMBULATORY_CARE_PROVIDER_SITE_OTHER): Payer: Commercial Managed Care - HMO | Admitting: Osteopathic Medicine

## 2015-02-23 ENCOUNTER — Encounter: Payer: Self-pay | Admitting: Osteopathic Medicine

## 2015-02-23 VITALS — BP 111/40 | HR 78 | Wt 173.0 lb

## 2015-02-23 DIAGNOSIS — M9902 Segmental and somatic dysfunction of thoracic region: Secondary | ICD-10-CM

## 2015-02-23 DIAGNOSIS — M9903 Segmental and somatic dysfunction of lumbar region: Secondary | ICD-10-CM | POA: Diagnosis not present

## 2015-02-23 DIAGNOSIS — M9901 Segmental and somatic dysfunction of cervical region: Secondary | ICD-10-CM | POA: Diagnosis not present
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
## 2015-03-09 ENCOUNTER — Ambulatory Visit: Payer: Commercial Managed Care - HMO | Admitting: Osteopathic Medicine

## 2015-03-10 ENCOUNTER — Ambulatory Visit (INDEPENDENT_AMBULATORY_CARE_PROVIDER_SITE_OTHER): Payer: Commercial Managed Care - HMO | Admitting: Osteopathic Medicine

## 2015-03-10 ENCOUNTER — Other Ambulatory Visit: Payer: Self-pay | Admitting: Family Medicine

## 2015-03-10 ENCOUNTER — Encounter: Payer: Self-pay | Admitting: Osteopathic Medicine

## 2015-03-10 VITALS — BP 116/47 | HR 75 | Ht 65.0 in | Wt 174.0 lb

## 2015-03-10 DIAGNOSIS — M9908 Segmental and somatic dysfunction of rib cage: Secondary | ICD-10-CM

## 2015-03-10 DIAGNOSIS — M545 Low back pain, unspecified: Secondary | ICD-10-CM

## 2015-03-10 DIAGNOSIS — M9901 Segmental and somatic dysfunction of cervical region: Secondary | ICD-10-CM | POA: Diagnosis not present

## 2015-03-10 DIAGNOSIS — G8929 Other chronic pain: Secondary | ICD-10-CM

## 2015-03-10 DIAGNOSIS — M9902 Segmental and somatic dysfunction of thoracic region: Secondary | ICD-10-CM | POA: Diagnosis not present
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
## 2015-03-11 ENCOUNTER — Encounter: Payer: Self-pay | Admitting: Family Medicine

## 2015-03-11 ENCOUNTER — Other Ambulatory Visit: Payer: Self-pay | Admitting: *Deleted

## 2015-03-11 DIAGNOSIS — M48061 Spinal stenosis, lumbar region without neurogenic claudication: Secondary | ICD-10-CM

## 2015-03-16 ENCOUNTER — Ambulatory Visit (INDEPENDENT_AMBULATORY_CARE_PROVIDER_SITE_OTHER): Payer: Commercial Managed Care - HMO | Admitting: Family Medicine

## 2015-03-16 ENCOUNTER — Encounter: Payer: Self-pay | Admitting: Family Medicine

## 2015-03-16 VITALS — BP 112/62 | HR 70 | Temp 98.4°F | Wt 176.0 lb

## 2015-03-16 DIAGNOSIS — R0602 Shortness of breath: Secondary | ICD-10-CM

## 2015-03-16 DIAGNOSIS — R519 Headache, unspecified: Secondary | ICD-10-CM

## 2015-03-16 DIAGNOSIS — R51 Headache: Secondary | ICD-10-CM

## 2015-03-16 DIAGNOSIS — K5900 Constipation, unspecified: Secondary | ICD-10-CM | POA: Diagnosis not present

## 2015-03-16 MED ORDER — GABAPENTIN 100 MG PO CAPS: 100.0000 mg | ORAL_CAPSULE | Freq: Every day | ORAL | Status: DC

## 2015-03-16 NOTE — Progress Notes (Signed)
Subjective:    Patient ID: Laural Golden, female    DOB: July 15, 1940, 75 y.o.   MRN: [id number]  HPI 75 year old female comes in today requesting referral for stress test. Her pain is around her bilateral lower rib cage. Says it is worse when standing up straight.  She says her last stress test was about 5 years ago. She also had a minimal amount of sclerotic plaque in the bilateral carotid little more on the right compared to the left on exam in 2015.  She is also been having neck and thoracic pain. Occ SOB with walking.  No swelling. No Chest pain.  Will schedule with Dr. Johnsie Cancel.  She has had some irregularities in her BP recently and concerned about that. No N/V. Says the HA can be intense like her migraines.    Constipation - she is doing well on the miralax. She says she was initially taking it at night and it didn't seem to be working so she moved to the morning and drinks it with hot water and feels like that has been working very well to keep her more regular.  She also complains of chronic daily headaches. She says they've been going on for almost 2 months. Sometimes they are more intense. She does have a history of migraine headaches. She did read online that melatonin can sometimes cause headaches. No worsening or alleviating factors. She does take tramadol for her back but that does seem to provide some mild relief for her headaches as well. No vision changes.  Review of Systems     Objective:   Physical Exam  Constitutional: She is oriented to person, place, and time. She appears well-developed and well-nourished.  HENT:  Head: Normocephalic and atraumatic.  Right Ear: External ear normal.  Left Ear: External ear normal.  Nose: Nose normal.  Eyes: Conjunctivae and EOM are normal. Pupils are equal, round, and reactive to light.  Neck: Neck supple. No thyromegaly present.  Cardiovascular: Normal rate, regular rhythm and normal heart sounds.   Pulmonary/Chest: Effort normal and  breath sounds normal.  Lymphadenopathy:    She has no cervical adenopathy.  Neurological: She is alert and oriented to person, place, and time.  Skin: Skin is warm and dry.  Psychiatric: She has a normal mood and affect. Her behavior is normal.          Assessment & Plan:  Shortness of breath with activities/exercise-will refer back to cardiology for possible repeat stress test. This may also just be deconditioning and poor endurance. She seems to be very concerned with her recent fluctuations in blood pressure that it may be related to her heart. It seems like her blood pressure looks fantastic today. Certainly she start 6 parents chest pain and she needs to go to the emergency department or be evaluated. Next  Constipation-doing well with the regimen of MiraLAX daily.  Chronic daily headache-discussed that several of her medications can actually trigger headaches including the tramadol, fluticasone, magnesium and even the Paxil. None of these are new when she's been taking all of these well before the chronic daily headaches started. She could consider  discontinuing the fluticasone just to see. We discussed treatment options. Recommend a trial of gabapentin I think this could help with her back pain in addition to the headaches. We discussed potential side effects. Start with 1 capsule. Okay to increase to 2 tabs after one week and then up to 3 tabs if needed after another week. Follow-up in about  4-6 weeks to make sure that she is doing well and to adjust dose. The headaches can also be triggered by cervical pain

## 2015-04-01 ENCOUNTER — Encounter: Payer: Self-pay | Admitting: Family Medicine

## 2015-04-10 ENCOUNTER — Ambulatory Visit (INDEPENDENT_AMBULATORY_CARE_PROVIDER_SITE_OTHER): Payer: Commercial Managed Care - HMO | Admitting: Cardiovascular Disease

## 2015-04-10 ENCOUNTER — Encounter: Payer: Self-pay | Admitting: Cardiovascular Disease

## 2015-04-10 VITALS — BP 112/80 | HR 80 | Ht 65.0 in | Wt 175.2 lb

## 2015-04-10 DIAGNOSIS — I9589 Other hypotension: Secondary | ICD-10-CM

## 2015-04-10 DIAGNOSIS — E785 Hyperlipidemia, unspecified: Secondary | ICD-10-CM

## 2015-04-10 DIAGNOSIS — E669 Obesity, unspecified: Secondary | ICD-10-CM | POA: Diagnosis not present

## 2015-04-10 DIAGNOSIS — R079 Chest pain, unspecified: Secondary | ICD-10-CM | POA: Diagnosis not present

## 2015-04-10 DIAGNOSIS — R0602 Shortness of breath: Secondary | ICD-10-CM

## 2015-04-10 NOTE — Progress Notes (Signed)
Cardiology Office Note   Date:  04/10/2015   ID:  Cheryl [name], Cheryl [name] [birth year], MRN [id number]  PCP:  [name], MD  Cardiologist:   Sharol Harness, MD   Chief Complaint  [name] presents with  . [name] (Initial Visit)    no chest pain, occassional shortness of breath, no edema, no pain or cramping in legs, no lightheadedness or dizziness, has fatigue      History of Present Illness: Cheryl [name] is a 75 y.o. female with hyperlipidemia and mild carotid stenosis who presents for an evaluation of rib pain.  Cheryl [name] saw her PCP, Dr. [name], on 03/16/15.  At that appointment she reported pain around her bilateral lower rib cage.  She notes Cheryl rib pain when walking.  She was diagnosed with costochondritis in Cheryl 1970s and this feels somewhat similar.  Cheryl pain is a pressure sensation 2/10 in severity.  There is associated shortness of breath but not nausea or vomiting.  Cheryl [name] likes to walk her dog daily either once or twice day for 30 minutes each time.  She denies lower extremity edema, orthopnea or PND.  Cheryl [name] has also noted low diastolic blood pressure for Cheryl last 2 months.  In January it was as low as Cheryl 30s-40s.  She was asymptomatic.  Her systolic blood pressure has not been low.   Past Medical History  Diagnosis Date  . Constipation   . Arthritis   . Depression   . GERD (gastroesophageal reflux disease)   . Hyperlipidemia   . Osteopenia   . Bronchitis     saw md 10-05-10  . Uterine cancer ([place]) 1973  . Diverticulosis   . Lung nodules   . Irritable bowel syndrome   . Bulging discs   . Spinal stenosis   . Bone spur   . Sigmoid diverticulitis   . Internal hemorrhoids   . Migraine headache   . Adenomatous colon polyp 2012  . Cyst, kidney, acquired   . Pancreatitis   . Hepatitis 2012    Past Surgical History  Procedure Laterality Date  . Appendectomy  1966  . Vaginal hysterectomy  1973  . Rotator cuff repair  2009   left  . Rectocele repair  2011  . Tonsillectomy    . Cataracts      bilateral  . Eus  02/10/2011    Procedure: UPPER ENDOSCOPIC ULTRASOUND (EUS) RADIAL;  Surgeon: Owens Loffler, MD;  Location: [facility];  Service: Endoscopy;  Laterality: N/A;  radial linear      Current Outpatient Prescriptions  Medication Sig Dispense Refill  . ALPRAZolam (XANAX) 0.5 MG tablet Take 1 tablet (0.5 mg total) by mouth at bedtime as needed for anxiety. 15 tablet 1  . aspirin 81 MG tablet Take 81 mg by mouth daily.      . Cholecalciferol (VITAMIN D) 1000 UNITS capsule Take 1,000 Units by mouth daily.      . Cyanocobalamin (B-12 PO) Take 1 capsule by mouth daily.    . fluticasone (FLONASE) 50 MCG/ACT nasal spray USE 2 SPRAYS IN EACH NOSTRIL EVERY DAY 48 g 3  . HYDROcodone-acetaminophen (NORCO/VICODIN) 5-325 MG per tablet Take 1 tablet by mouth every 8 (eight) hours as needed for moderate pain. 40 tablet 0  . MAGNESIUM PO Take by mouth.    . Melatonin 5 MG CAPS Take by mouth daily. TAKE 1 CAPSULE AT BEDTIME    . omeprazole (PRILOSEC) 20 MG capsule TAKE 1 CAPSULE  TWICE DAILY BEFORE A MEAL 180 capsule 3  . PARoxetine (PAXIL) 20 MG tablet TAKE 1 TABLET EVERY MORNING 90 tablet 3  . PREMARIN vaginal cream   11  . RESTASIS 0.05 % ophthalmic emulsion Place 1 drop into both eyes 2 (two) times daily.     . simvastatin (ZOCOR) 40 MG tablet TAKE 1/2 TABLET (20MG ) AT BEDTIME 45 tablet 2  . traMADol (ULTRAM) 50 MG tablet TAKE 1 TABLET TWICE DAILY AS NEEDED 180 tablet 1   No current facility-administered medications for this visit.    Allergies:   Codeine sulfate; Contrast media; Fluoxetine hcl; and Sulfonamide derivatives    Social History:  Cheryl [name]  reports that she quit smoking about 22 years ago. She has never used smokeless tobacco. She reports that she does not drink alcohol or use illicit drugs.   Family History:  Cheryl [name]'s family history includes Alcohol abuse in her brother and father; Crohn's disease  in her brother; Depression in her brother; HIV in her son; Heart failure in her daughter; Kidney failure in her mother; Lung cancer in her father and son; Rheum arthritis in her mother. There is no history of Malignant hyperthermia.    ROS:  Please see Cheryl history of present illness.   Otherwise, review of systems are positive for none.   All other systems are reviewed and negative.    PHYSICAL EXAM: VS:  BP 112/80 mmHg  Pulse 80  Ht 5\' 5"  (1.651 m)  Wt 79.465 kg (175 lb 3 oz)  BMI 29.15 kg/m2 , BMI Body mass index is 29.15 kg/(m^2). GENERAL:  Well appearing HEENT:  Pupils equal round and reactive, fundi not visualized, oral mucosa unremarkable NECK:  No jugular venous distention, waveform within normal limits, carotid upstroke brisk and symmetric, no bruits, no thyromegaly LYMPHATICS:  No cervical adenopathy LUNGS:  Clear to auscultation bilaterally HEART:  RRR.  PMI not displaced or sustained,S1 and S2 within normal limits, no S3, no S4, no clicks, no rubs, no murmurs ABD:  Flat, positive bowel sounds normal in frequency in pitch, no bruits, no rebound, no guarding, no midline pulsatile mass, no hepatomegaly, no splenomegaly EXT:  2 plus pulses throughout, no edema, no cyanosis no clubbing SKIN:  No rashes no nodules NEURO:  Cranial nerves II through XII grossly intact, motor grossly intact throughout PSYCH:  Cognitively intact, oriented to person place and time    EKG:  EKG is ordered today. Cheryl ekg ordered today demonstrates sinus rhythm.  Rate 80 bpm.     Recent Labs: 12/19/2014: ALT 22; BUN 19; Creat 0.88; Potassium 4.5; Sodium 141; TSH 2.287    Lipid Panel    Component Value Date/Time   CHOL 176 12/19/2014 1034   CHOL 244* 03/02/2011 0045   TRIG 109 12/19/2014 1034   TRIG 303* 03/02/2011 0045   HDL 51 12/19/2014 1034   HDL 45 03/02/2011 0045   CHOLHDL 3.5 12/19/2014 1034   VLDL 22 12/19/2014 1034   LDLCALC 103 12/19/2014 1034   LDLCALC 138* 03/02/2011 0045       Wt Readings from Last 3 Encounters:  04/10/15 79.465 kg (175 lb 3 oz)  03/16/15 79.833 kg (176 lb)  03/10/15 78.926 kg (174 lb)      ASSESSMENT AND PLAN:  # Chest pain, shortness of breath: Ms. Vess symptoms are concerning for an anginal equivalent.  We will obtain a Lexiscan Cardiolite to evaluate for ischemia.  If this is negative, we will obtain an echo to ensure  that she does not have diastolic dysfunction.  # Low diastolic blood pressure: Cheryl [name] is concerned that her DBP has been low.  It has now normalized.  She has been asymptomatic.  It is possible that she has developed increased arterial stiffness with age.  There is no treatment for this .  We will continue to monitor for now.  # Obesity: Cheryl [name] was congratulated on her dietary changes, exercise and weight loss.  She was encouraged to keep it up.  # Hyperlipidemia: Lipids well-controlled.  Continue simvastatin.  Current medicines are reviewed at length with Cheryl [name] today.  Cheryl [name] does not have concerns regarding medicines.  Cheryl following changes have been made:  no change  Labs/ tests ordered today include:   Orders Placed This Encounter  Procedures  . Myocardial Perfusion Imaging  . EKG 12-Lead     Disposition:   FU with Keenan Dimitrov C. Oval Linsey, MD, [facility] in 1 year.   This note was written with Cheryl assistance of speech recognition software.  Please excuse any transcriptional errors.  Signed, Zamarah Ullmer C. Oval Linsey, MD, [facility]  04/10/2015 2:54 PM    [facility]

## 2015-04-10 NOTE — Patient Instructions (Signed)
Dr Oval Linsey has requested that you have a lexiscan myoview. For further information please visit [email or web address]. Please follow instruction sheet, as given.  Dr Oval Linsey recommends that you schedule a follow-up appointment in 1 year. You will receive a reminder letter in the mail two months in advance. If you don't receive a letter, please call our office to schedule the follow-up appointment.  If you need a refill on your cardiac medications before your next appointment, please call your pharmacy.

## 2015-04-11 ENCOUNTER — Encounter: Payer: Self-pay | Admitting: Family Medicine

## 2015-04-14 NOTE — Telephone Encounter (Signed)
Please see note below. Please call Dr. Blenda Mounts office. They really should be handling the prior authorization but I just want to make sure it's not one of those quirky things were has to come from the primary care provider.

## 2015-04-20 ENCOUNTER — Ambulatory Visit (INDEPENDENT_AMBULATORY_CARE_PROVIDER_SITE_OTHER): Payer: Commercial Managed Care - HMO | Admitting: Osteopathic Medicine

## 2015-04-20 ENCOUNTER — Encounter: Payer: Self-pay | Admitting: Osteopathic Medicine

## 2015-04-20 VITALS — BP 119/59 | HR 84 | Ht 65.0 in | Wt 176.0 lb

## 2015-04-20 DIAGNOSIS — M9901 Segmental and somatic dysfunction of cervical region: Secondary | ICD-10-CM

## 2015-04-20 DIAGNOSIS — M9902 Segmental and somatic dysfunction of thoracic region: Secondary | ICD-10-CM

## 2015-04-20 DIAGNOSIS — R519 Headache, unspecified: Secondary | ICD-10-CM

## 2015-04-20 DIAGNOSIS — R51 Headache: Secondary | ICD-10-CM
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
## 2015-04-22 ENCOUNTER — Telehealth (HOSPITAL_COMMUNITY): Payer: Self-pay

## 2015-04-22 NOTE — Telephone Encounter (Signed)
Encounter complete. 

## 2015-04-24 ENCOUNTER — Inpatient Hospital Stay (HOSPITAL_COMMUNITY): Admission: RE | Admit: 2015-04-24 | Payer: Commercial Managed Care - HMO | Source: Ambulatory Visit

## 2015-05-05 ENCOUNTER — Telehealth (HOSPITAL_COMMUNITY): Payer: Self-pay

## 2015-05-05 NOTE — Telephone Encounter (Signed)
Encounter complete. 

## 2015-05-07 ENCOUNTER — Ambulatory Visit (HOSPITAL_COMMUNITY)
Admission: RE | Admit: 2015-05-07 | Discharge: 2015-05-07 | Disposition: A | Discharge status: Home / Self Care | Payer: Commercial Managed Care - HMO | Source: Ambulatory Visit | Attending: Cardiology | Admitting: Cardiology

## 2015-05-07 DIAGNOSIS — R079 Chest pain, unspecified: Secondary | ICD-10-CM | POA: Insufficient documentation

## 2015-05-07 DIAGNOSIS — R5383 Other fatigue: Secondary | ICD-10-CM | POA: Diagnosis not present

## 2015-05-07 DIAGNOSIS — Z6829 Body mass index (BMI) 29.0-29.9, adult: Secondary | ICD-10-CM | POA: Diagnosis not present

## 2015-05-07 DIAGNOSIS — R9439 Abnormal result of other cardiovascular function study: Secondary | ICD-10-CM | POA: Insufficient documentation

## 2015-05-07 DIAGNOSIS — R0602 Shortness of breath: Secondary | ICD-10-CM | POA: Insufficient documentation

## 2015-05-07 DIAGNOSIS — Z87891 Personal history of nicotine dependence: Secondary | ICD-10-CM | POA: Insufficient documentation

## 2015-05-07 DIAGNOSIS — I739 Peripheral vascular disease, unspecified: Secondary | ICD-10-CM | POA: Diagnosis not present

## 2015-05-07 DIAGNOSIS — E663 Overweight: Secondary | ICD-10-CM | POA: Diagnosis not present

## 2015-05-07 DIAGNOSIS — I779 Disorder of arteries and arterioles, unspecified: Secondary | ICD-10-CM | POA: Diagnosis not present

## 2015-05-07 LAB — MYOCARDIAL PERFUSION IMAGING
LV dias vol: 64 mL (ref 46–106)
LV sys vol: 25 mL
Peak HR: 88 {beats}/min
Rest HR: 66 {beats}/min
SDS: 0
SRS: 3
SSS: 3
TID: 1.28

## 2015-05-07 MED ORDER — REGADENOSON 0.4 MG/5ML IV SOLN
0.4000 mg | Freq: Once | INTRAVENOUS | Status: AC
  Administered 2015-05-07: 0.4 mg via INTRAVENOUS

## 2015-05-07 MED ORDER — TECHNETIUM TC 99M SESTAMIBI GENERIC - CARDIOLITE
29.5000 | Freq: Once | INTRAVENOUS | Status: AC | PRN
  Administered 2015-05-07: 30 via INTRAVENOUS

## 2015-05-07 MED ORDER — TECHNETIUM TC 99M SESTAMIBI GENERIC - CARDIOLITE
10.1000 | Freq: Once | INTRAVENOUS | Status: AC | PRN
  Administered 2015-05-07: 10.1 via INTRAVENOUS

## 2015-05-07 MED ORDER — AMINOPHYLLINE 25 MG/ML IV SOLN
100.0000 mg | Freq: Once | INTRAVENOUS | Status: AC
  Administered 2015-05-07: 100 mg via INTRAVENOUS

## 2015-05-11 ENCOUNTER — Encounter: Payer: Self-pay | Admitting: Osteopathic Medicine

## 2015-05-11 ENCOUNTER — Telehealth: Payer: Self-pay | Admitting: *Deleted

## 2015-05-11 ENCOUNTER — Ambulatory Visit (INDEPENDENT_AMBULATORY_CARE_PROVIDER_SITE_OTHER): Payer: Commercial Managed Care - HMO | Admitting: Osteopathic Medicine

## 2015-05-11 VITALS — BP 129/74 | HR 81 | Ht 65.0 in | Wt 175.0 lb

## 2015-05-11 DIAGNOSIS — M9902 Segmental and somatic dysfunction of thoracic region: Secondary | ICD-10-CM

## 2015-05-11 DIAGNOSIS — M9901 Segmental and somatic dysfunction of cervical region: Secondary | ICD-10-CM

## 2015-05-11 DIAGNOSIS — R0602 Shortness of breath: Secondary | ICD-10-CM

## 2015-05-11 DIAGNOSIS — R079 Chest pain, unspecified: Secondary | ICD-10-CM

## 2015-05-11 NOTE — Telephone Encounter (Signed)
-----   Message from [name], MD sent at 05/07/2015  5:23 PM EDT ----- Low risk stress test.  This does not explain her chest pain or shortness of breath. Her heart as squeezing normally, but let's get an echo to make sure she does not have diastolic dysfunction or valvular heart disease.
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
## 2015-05-11 NOTE — Telephone Encounter (Signed)
Notes Recorded by Cheryl [name] on 05/11/2015 at 4:14 PM Advised patient and scheduled follow up echo

## 2015-05-15 ENCOUNTER — Other Ambulatory Visit: Payer: Self-pay | Admitting: Family Medicine

## 2015-05-27 ENCOUNTER — Ambulatory Visit (INDEPENDENT_AMBULATORY_CARE_PROVIDER_SITE_OTHER): Payer: Commercial Managed Care - HMO | Admitting: Osteopathic Medicine

## 2015-05-27 ENCOUNTER — Encounter: Payer: Self-pay | Admitting: Osteopathic Medicine

## 2015-05-27 VITALS — BP 94/57 | HR 76 | Ht 65.0 in | Wt 179.0 lb

## 2015-05-27 DIAGNOSIS — M9902 Segmental and somatic dysfunction of thoracic region: Secondary | ICD-10-CM

## 2015-05-27 DIAGNOSIS — M9901 Segmental and somatic dysfunction of cervical region: Secondary | ICD-10-CM

## 2015-05-28 ENCOUNTER — Other Ambulatory Visit: Payer: Self-pay

## 2015-05-28 ENCOUNTER — Ambulatory Visit (HOSPITAL_COMMUNITY): Payer: Commercial Managed Care - HMO | Attending: Cardiology

## 2015-05-28 DIAGNOSIS — Z6829 Body mass index (BMI) 29.0-29.9, adult: Secondary | ICD-10-CM | POA: Diagnosis not present

## 2015-05-28 DIAGNOSIS — E669 Obesity, unspecified: Secondary | ICD-10-CM | POA: Diagnosis not present

## 2015-05-28 DIAGNOSIS — E785 Hyperlipidemia, unspecified: Secondary | ICD-10-CM | POA: Diagnosis not present

## 2015-05-28 DIAGNOSIS — I340 Nonrheumatic mitral (valve) insufficiency: Secondary | ICD-10-CM | POA: Diagnosis not present

## 2015-05-28 DIAGNOSIS — R079 Chest pain, unspecified: Secondary | ICD-10-CM

## 2015-05-28 DIAGNOSIS — I071 Rheumatic tricuspid insufficiency: Secondary | ICD-10-CM | POA: Diagnosis not present

## 2015-05-28 DIAGNOSIS — R0602 Shortness of breath: Secondary | ICD-10-CM

## 2015-05-28 DIAGNOSIS — Z87891 Personal history of nicotine dependence: Secondary | ICD-10-CM | POA: Insufficient documentation

## 2015-05-28 DIAGNOSIS — I5189 Other ill-defined heart diseases: Secondary | ICD-10-CM | POA: Diagnosis not present
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
## 2015-06-01 ENCOUNTER — Encounter: Payer: Self-pay | Admitting: Family Medicine

## 2015-06-05 ENCOUNTER — Encounter: Payer: Self-pay | Admitting: Cardiovascular Disease

## 2015-06-05 ENCOUNTER — Ambulatory Visit (INDEPENDENT_AMBULATORY_CARE_PROVIDER_SITE_OTHER): Payer: Commercial Managed Care - HMO | Admitting: Cardiovascular Disease

## 2015-06-05 VITALS — BP 114/68 | HR 88 | Ht 65.0 in | Wt 178.2 lb

## 2015-06-05 DIAGNOSIS — R0602 Shortness of breath: Secondary | ICD-10-CM

## 2015-06-05 DIAGNOSIS — R42 Dizziness and giddiness: Secondary | ICD-10-CM

## 2015-06-05 DIAGNOSIS — E785 Hyperlipidemia, unspecified: Secondary | ICD-10-CM | POA: Diagnosis not present

## 2015-06-05 NOTE — Patient Instructions (Signed)
Medication Instructions:  Your physician recommends that you continue on your current medications as directed. Please refer to the Current Medication list given to you today.  Labwork: NONE  Testing/Procedures: NONE  Follow-Up: Your physician wants you to follow-up in: 1 [place] will receive a reminder letter in the mail two months in advance. If you don't receive a letter, please call our office to schedule the follow-up appointment.  Any Other Special Instructions Will Be Listed Below (If Applicable). WEAR COMPRESSION STOCKINGS  INCREASE YOUR FLUID AND SALT INTAKE   If you need a refill on your cardiac medications before your next appointment, please call your pharmacy.

## 2015-06-05 NOTE — Progress Notes (Signed)
Cardiology Office Note   Date:  06/06/2015   ID:  Faron, Carawan 1940-04-06, MRN [id number]  PCP:  [name], MD  Cardiologist:   [name], MD   Chief Complaint  Patient presents with  . Follow-up    exercise myoview  pt c/o some SOB, dizziness when she gets up and sometimes in the morning, no other Sx.   Patient ID: Cheryl [name] is a 75 y.o. female with hyperlipidemia and mild carotid stenosis who presents for follow up.  Interval History 06/05/15: After her last appointment Cheryl [name] had a [employer] that was negative for ischemia.  There was a fixed septal/inferoseptal defect that was felt to be artifact. She also had an echo that showed grade 1 diastolic dysfunction.  Cheryl [name] Has been feeling well. Her only complaint is dizziness that is worse in the mornings. It typically occurs when she changes positions. She denies any chest pain, shortness of breath, lower extremity edema, orthopnea or PND. She is here for follow-up on her echo and stress results. She is concerned about the finding of diastolic dysfunction and wonders if she has heart failure.   History of Present Illness 05/06/15: Cheryl [name] saw her PCP, Dr. [name], on 03/16/15.  At that appointment she reported pain around her bilateral lower rib cage.  She notes the rib pain when walking.  She was diagnosed with costochondritis in the 1970s and this feels somewhat similar.  The pain is a pressure sensation 2/10 in severity.  There is associated shortness of breath but not nausea or vomiting.  Cheryl [name] likes to walk her dog daily either once or twice day for 30 minutes each time.  She denies lower extremity edema, orthopnea or PND.  Cheryl [name] has also noted low diastolic blood pressure for the last 2 months.  In January it was as low as the 30s-40s.  She was asymptomatic.  Her systolic blood pressure has not been low.   Past Medical History  Diagnosis Date  . Constipation     . Arthritis   . Depression   . GERD (gastroesophageal reflux disease)   . Hyperlipidemia   . Osteopenia   . Bronchitis     saw md 10-05-10  . Uterine cancer ([place]) 1973  . Diverticulosis   . Lung nodules   . Irritable bowel syndrome   . Bulging discs   . Spinal stenosis   . Bone spur   . Sigmoid diverticulitis   . Internal hemorrhoids   . Migraine headache   . Adenomatous colon polyp 2012  . Cyst, kidney, acquired   . Pancreatitis   . Hepatitis 2012    Past Surgical History  Procedure Laterality Date  . Appendectomy  1966  . Vaginal hysterectomy  1973  . Rotator cuff repair  2009    left  . Rectocele repair  2011  . Tonsillectomy    . Cataracts      bilateral  . Eus  02/10/2011    Procedure: UPPER ENDOSCOPIC ULTRASOUND (EUS) RADIAL;  Surgeon: Owens Loffler, MD;  Location: [facility];  Service: Endoscopy;  Laterality: N/A;  radial linear     Current Outpatient Prescriptions  Medication Sig Dispense Refill  . ALPRAZolam (XANAX) 0.5 MG tablet Take 1 tablet (0.5 mg total) by mouth at bedtime as needed for anxiety. 15 tablet 1  . aspirin 81 MG tablet Take 81 mg by mouth daily.      . Cholecalciferol (VITAMIN D) 1000 UNITS  capsule Take 1,000 Units by mouth daily.      . Cyanocobalamin (B-12 PO) Take 1 capsule by mouth daily.    . fluticasone (FLONASE) 50 MCG/ACT nasal spray USE 2 SPRAYS IN EACH NOSTRIL EVERY DAY 48 g 3  . HYDROcodone-acetaminophen (NORCO/VICODIN) 5-325 MG per tablet Take 1 tablet by mouth every 8 (eight) hours as needed for moderate pain. 40 tablet 0  . MAGNESIUM PO Take by mouth.    . Melatonin 5 MG CAPS Take by mouth daily. TAKE 1 CAPSULE AT BEDTIME    . omeprazole (PRILOSEC) 20 MG capsule TAKE 1 CAPSULE TWICE DAILY BEFORE A MEAL 180 capsule 3  . PARoxetine (PAXIL) 20 MG tablet TAKE 1 TABLET EVERY MORNING 90 tablet 3  . PREMARIN vaginal cream   11  . RESTASIS 0.05 % ophthalmic emulsion Place 1 drop into both eyes 2 (two) times daily.     . simvastatin  (ZOCOR) 40 MG tablet Take 1 tablet (40 mg total) by mouth at bedtime. APPOINTMENT NEEDED FOR FURTHER REFIILS 45 tablet 0  . traMADol (ULTRAM) 50 MG tablet TAKE 1 TABLET TWICE DAILY AS NEEDED 180 tablet 1   No current facility-administered medications for this visit.    Allergies:   Codeine sulfate; Contrast media; Fluoxetine hcl; and Sulfonamide derivatives    Social History:  The patient  reports that she quit smoking about 22 years ago. She has never used smokeless tobacco. She reports that she does not drink alcohol or use illicit drugs.   Family History:  The patient's family history includes Alcohol abuse in her brother and father; Crohn's disease in her brother; Depression in her brother; HIV in her son; Heart failure in her daughter; Kidney failure in her mother; Lung cancer in her father and son; Rheum arthritis in her mother. There is no history of Malignant hyperthermia.    ROS:  Please see the history of present illness.   Otherwise, review of systems are positive for none.   All other systems are reviewed and negative.    PHYSICAL EXAM: VS:  BP 114/68 mmHg  Pulse 88  Ht 5\' 5"  (1.651 m)  Wt 80.831 kg (178 lb 3.2 oz)  BMI 29.65 kg/m2 , BMI Body mass index is 29.65 kg/(m^2). GENERAL:  Well appearing HEENT:  Pupils equal round and reactive, fundi not visualized, oral mucosa unremarkable NECK:  No jugular venous distention, waveform within normal limits, carotid upstroke brisk and symmetric, no bruits, no thyromegaly LYMPHATICS:  No cervical adenopathy LUNGS:  Clear to auscultation bilaterally HEART:  RRR.  PMI not displaced or sustained,S1 and S2 within normal limits, no S3, no S4, no clicks, no rubs, no murmurs ABD:  Flat, positive bowel sounds normal in frequency in pitch, no bruits, no rebound, no guarding, no midline pulsatile mass, no hepatomegaly, no splenomegaly EXT:  2 plus pulses throughout, no edema, no cyanosis no clubbing SKIN:  No rashes no nodules NEURO:  Cranial  nerves II through XII grossly intact, motor grossly intact throughout PSYCH:  Cognitively intact, oriented to person place and time   EKG:  EKG is not ordered today.  Echo 05/28/15: Study Conclusions  - Left ventricle: The cavity size was normal. Wall thickness was  normal. Systolic function was normal. The estimated ejection  fraction was in the range of 55% to 60%. Wall motion was normal;  there were no regional wall motion abnormalities. Doppler  parameters are consistent with abnormal left ventricular  relaxation (grade 1 diastolic dysfunction).  Impressions:  -  Normal LV systolic function; grade 1 diastolic dysfunction; trace  MR and TR.  Lexiscan Cardiolite 05/07/15:  Nuclear stress EF: 60%.  The left ventricular ejection fraction is normal (55-65%).  There was no ST segment deviation noted during stress.  No T wave inversion was noted during stress.  Defect 1: There is a small defect of moderate severity.  This is a low risk study.  Small size, moderate intensity fixed septal/inferoseptal perfusion defect which is likely artifact. No reversible ischemia. LVEF 60% with normal wall motion. This is a low risk study.   Recent Labs: 12/19/2014: ALT 22; BUN 19; Creat 0.88; Potassium 4.5; Sodium 141; TSH 2.287    Lipid Panel    Component Value Date/Time   CHOL 176 12/19/2014 1034   CHOL 244* 03/02/2011 0045   TRIG 109 12/19/2014 1034   TRIG 303* 03/02/2011 0045   HDL 51 12/19/2014 1034   HDL 45 03/02/2011 0045   CHOLHDL 3.5 12/19/2014 1034   VLDL 22 12/19/2014 1034   LDLCALC 103 12/19/2014 1034   LDLCALC 138* 03/02/2011 0045      Wt Readings from Last 3 Encounters:  06/05/15 80.831 kg (178 lb 3.2 oz)  05/27/15 81.194 kg (179 lb)  05/11/15 79.379 kg (175 lb)      ASSESSMENT AND PLAN:  # Chest pain, shortness of breath: Cheryl [name] Continues to have some shortness of breath but denies any chest pain. Her echo showed grade 1 diastolic  dysfunction and her stress test was negative for ischemia. She does not have any evidence of heart failure on exam. I suspect that the diastolic dysfunction is unrelated to her shortness of breath. She was encouraged to increase her physical activity as this may be due to body habitus and deconditioning.  She was reassured that she does not have heart failure.  # Dizziness: Cheryl [name] has positional dizziness that is likely due to orthostatic hypotension. Her blood pressure is low at baseline. She drinks mostly water throughout the day. I suggested that she wear compression stockings and increase her salt intake. She was not orthostatic in clinic today.  I suggested that she take her time when making positional changes.  # Hyperlipidemia: Lipids well-controlled.  Continue simvastatin.  Current medicines are reviewed at length with the patient today.  The patient does not have concerns regarding medicines.  The following changes have been made:  no change  Labs/ tests ordered today include:   No orders of the defined types were placed in this encounter.     Disposition:   FU with Romesha Scherer C. Oval Linsey, MD, [facility] in 1 year.   This note was written with the assistance of speech recognition software.  Please excuse any transcriptional errors.  Signed, Rease Wence C. Oval Linsey, MD, [facility]  06/06/2015 7:10 AM    [place]

## 2015-06-06 ENCOUNTER — Encounter: Payer: Self-pay | Admitting: Cardiovascular Disease

## 2015-06-19 ENCOUNTER — Other Ambulatory Visit: Payer: Self-pay | Admitting: Family Medicine

## 2015-06-22 ENCOUNTER — Other Ambulatory Visit: Payer: Self-pay | Admitting: Family Medicine

## 2015-07-07 ENCOUNTER — Ambulatory Visit: Payer: Commercial Managed Care - HMO | Admitting: Family Medicine

## 2015-07-27 ENCOUNTER — Other Ambulatory Visit: Payer: Self-pay | Admitting: Family Medicine

## 2015-08-05 ENCOUNTER — Other Ambulatory Visit: Payer: Self-pay | Admitting: Family Medicine

## 2015-08-27 ENCOUNTER — Encounter: Payer: Self-pay | Admitting: Family Medicine

## 2015-08-27 ENCOUNTER — Telehealth: Payer: Self-pay | Admitting: *Deleted

## 2015-08-27 DIAGNOSIS — Z1211 Encounter for screening for malignant neoplasm of colon: Secondary | ICD-10-CM

## 2015-08-27 NOTE — Telephone Encounter (Signed)
Patient sent a mychart message requesting a referral to Dr. Henrene Pastor for her 5 year f/u colonoscopy.Referral placed. She states the miralax has stopped working for constipation and she is concerned. ( assuming since they did remove multiple polyps during the last colonoscopy.) . Do you have any other recommendations in the meantime in re to her constipation?

## 2015-08-27 NOTE — Telephone Encounter (Signed)
Ok to try a stimulant laxative like Ex-Lax or Sennakot.

## 2015-08-28 ENCOUNTER — Encounter: Payer: Self-pay | Admitting: Family Medicine

## 2015-08-28 ENCOUNTER — Encounter: Payer: Self-pay | Admitting: Internal Medicine

## 2015-08-28 NOTE — Telephone Encounter (Signed)
Left message on patients vm

## 2015-09-15 ENCOUNTER — Ambulatory Visit (INDEPENDENT_AMBULATORY_CARE_PROVIDER_SITE_OTHER): Payer: Commercial Managed Care - HMO | Admitting: Osteopathic Medicine

## 2015-09-15 ENCOUNTER — Encounter: Payer: Self-pay | Admitting: Osteopathic Medicine

## 2015-09-15 VITALS — BP 92/55 | HR 104 | Ht 65.0 in | Wt 186.0 lb

## 2015-09-15 DIAGNOSIS — M9901 Segmental and somatic dysfunction of cervical region: Secondary | ICD-10-CM | POA: Diagnosis not present

## 2015-09-15 DIAGNOSIS — M9902 Segmental and somatic dysfunction of thoracic region: Secondary | ICD-10-CM | POA: Diagnosis not present
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
## 2015-09-21 ENCOUNTER — Encounter: Payer: Self-pay | Admitting: Osteopathic Medicine

## 2015-09-21 ENCOUNTER — Ambulatory Visit (INDEPENDENT_AMBULATORY_CARE_PROVIDER_SITE_OTHER): Payer: Commercial Managed Care - HMO | Admitting: Osteopathic Medicine

## 2015-09-21 VITALS — BP 96/62 | HR 81 | Wt 189.0 lb

## 2015-09-21 DIAGNOSIS — M9902 Segmental and somatic dysfunction of thoracic region: Secondary | ICD-10-CM

## 2015-09-21 DIAGNOSIS — M9901 Segmental and somatic dysfunction of cervical region: Secondary | ICD-10-CM | POA: Diagnosis not present

## 2015-09-21 DIAGNOSIS — M9905 Segmental and somatic dysfunction of pelvic region: Secondary | ICD-10-CM | POA: Diagnosis not present
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
## 2015-10-05 ENCOUNTER — Ambulatory Visit (INDEPENDENT_AMBULATORY_CARE_PROVIDER_SITE_OTHER): Payer: Commercial Managed Care - HMO | Admitting: Family Medicine

## 2015-10-05 ENCOUNTER — Encounter: Payer: Self-pay | Admitting: Family Medicine

## 2015-10-05 VITALS — BP 120/50 | HR 77 | Ht 65.0 in | Wt 186.0 lb

## 2015-10-05 DIAGNOSIS — K5900 Constipation, unspecified: Secondary | ICD-10-CM

## 2015-10-05 DIAGNOSIS — Z23 Encounter for immunization: Secondary | ICD-10-CM

## 2015-10-05 DIAGNOSIS — M545 Low back pain, unspecified: Secondary | ICD-10-CM

## 2015-10-05 DIAGNOSIS — R51 Headache: Secondary | ICD-10-CM

## 2015-10-05 DIAGNOSIS — R519 Headache, unspecified: Secondary | ICD-10-CM

## 2015-10-05 DIAGNOSIS — Z658 Other specified problems related to psychosocial circumstances: Secondary | ICD-10-CM

## 2015-10-05 DIAGNOSIS — K5909 Other constipation: Secondary | ICD-10-CM

## 2015-10-05 DIAGNOSIS — F439 Reaction to severe stress, unspecified: Secondary | ICD-10-CM

## 2015-10-05 MED ORDER — ALPRAZOLAM 0.5 MG PO TABS: 0.5000 mg | ORAL_TABLET | Freq: Every evening | ORAL | 1 refills | Status: DC | PRN

## 2015-10-05 MED ORDER — TRAMADOL HCL 50 MG PO TABS: 50.0000 mg | ORAL_TABLET | Freq: Two times a day (BID) | ORAL | 1 refills | Status: DC | PRN

## 2015-10-05 NOTE — Progress Notes (Signed)
Subjective:    CC: Pressure in head   HPI:  C/O pressure in her head - Patient complains of a pressure in her head. She thinks it may be sinus pressure because she's also been experiencing postnasal drip for at least several weeks. Most of the pressure is frontal. Still having a lot of neck pain. She feels like the top of her neck just locks up. She has been seeing a chiropractor and also idea on and off. It does help temporarily.  Constipation - she is also concerned that she may be constipated. In fact she took 2 stool softeners last night and finally had a bowel movement but says that it was hard and she had to strain. She's been having some low back pain and thinks that it could be related and is requesting an x-ray today. Next  She denied a lot of stress helping to take care of her daughter and her ex-husband.Her ex-husband is suffering from liver failure says she has to help taken to the [place] a couple of times a week for paracentesis and treatment for congestive heart failure. Her daughter is also having a lot of medical problems herself. Her daughter is on dialysis I believe.  Past medical history, Surgical history, Family history not pertinant except as noted below, Social history, Allergies, and medications have been entered into the medical record, reviewed, and corrections made.   Review of Systems: No fevers, chills, night sweats, weight loss, chest pain, or shortness of breath.   Objective:    General: Well Developed, well nourished, and in no acute distress.  Neuro: Alert and oriented x3, extra-ocular muscles intact, sensation grossly intact.  HEENT: Normocephalic, atraumatic , Oropharynx is clear, TMs and canals are clear bilaterally. No significant cervical lymphadenopathy Skin: Warm and dry, no rashes. Cardiac: Regular rate and rhythm, no murmurs rubs or gallops, no lower extremity edema.  Respiratory: Clear to auscultation bilaterally. Not using accessory muscles, speaking in  full sentences. Abd: soft, tender in the mid left and mid right abdomen. MOre tender on the left. No organomegaly.   Impression and Recommendations:    C/O pressure in her head/sinus pressure - Most likely just sinus pressure. No sign of acute sinusitis today on exam. It actually be coming from her neck. A sugar to get exercises to do for the facet joints in the upper neck and continue to use heating pad regularly and work on stretches.  Constipation - assess treatment options. She Artie has the GoLYTELY at home. She did have a prescription but never actually took it so I encouraged her to use it to do a cleanout after that start MiraLAX 1 capful mixed with 8 ounces of water nightly until she has her colonoscopy which is scheduled for next month.  Check TSH.    Low back pain - I do think her low back pain is probably being exacerbated by her constipation. We can certainly consider getting an x-ray at the end of the week if she's not improving.  Increased stress  - He did also discussed strategies to work on reducing her stress. Did refill her alprazolam but reminded her to use very sparingly. levels. Continue paroxetine.

## 2015-10-06 ENCOUNTER — Telehealth: Payer: Self-pay | Admitting: *Deleted

## 2015-10-06 DIAGNOSIS — N289 Disorder of kidney and ureter, unspecified: Secondary | ICD-10-CM

## 2015-10-06 LAB — TSH: TSH: 2.81 mIU/L

## 2015-10-06 LAB — BASIC METABOLIC PANEL WITH GFR
BUN: 13 mg/dL (ref 7–25)
CO2: 27 mmol/L (ref 20–31)
Calcium: 9.6 mg/dL (ref 8.6–10.4)
Chloride: 103 mmol/L (ref 98–110)
Creat: 1.03 mg/dL — ABNORMAL HIGH (ref 0.60–0.93)
GFR, Est African American: 62 mL/min (ref >=60)
GFR, Est Non African American: 54 mL/min — ABNORMAL LOW (ref >=60)
Glucose, Bld: 86 mg/dL (ref 65–99)
Potassium: 4.3 mmol/L (ref 3.5–5.3)
Sodium: 140 mmol/L (ref 135–146)

## 2015-10-06 NOTE — Telephone Encounter (Signed)
labs

## 2015-10-07 ENCOUNTER — Other Ambulatory Visit: Payer: Self-pay | Admitting: Family Medicine

## 2015-10-07 ENCOUNTER — Encounter: Payer: Self-pay | Admitting: Family Medicine

## 2015-10-21 ENCOUNTER — Ambulatory Visit (AMBULATORY_SURGERY_CENTER): Payer: Self-pay

## 2015-10-21 VITALS — Ht 65.0 in | Wt 188.0 lb

## 2015-10-21 DIAGNOSIS — Z860100 Personal history of colon polyps, unspecified: Secondary | ICD-10-CM

## 2015-10-21 MED ORDER — SUPREP BOWEL PREP KIT 17.5-3.13-1.6 GM/177ML PO SOLN: 1.0000 | Freq: Once | ORAL | 0 refills | Status: AC

## 2015-10-21 NOTE — Progress Notes (Signed)
No allergies to eggs or soy No past problems with anesthesia No home oxygen No diet meds  Declined emmi 

## 2015-10-22 ENCOUNTER — Encounter: Payer: Self-pay | Admitting: Internal Medicine

## 2015-11-04 ENCOUNTER — Ambulatory Visit (AMBULATORY_SURGERY_CENTER): Payer: Commercial Managed Care - HMO | Admitting: Internal Medicine

## 2015-11-04 ENCOUNTER — Encounter: Payer: Self-pay | Admitting: Internal Medicine

## 2015-11-04 VITALS — BP 118/60 | HR 75 | Temp 98.7°F | Resp 17 | Ht 65.0 in | Wt 188.0 lb

## 2015-11-04 DIAGNOSIS — Z86010 Personal history of colonic polyps: Secondary | ICD-10-CM | POA: Diagnosis present

## 2015-11-04 DIAGNOSIS — D122 Benign neoplasm of ascending colon: Secondary | ICD-10-CM

## 2015-11-04 DIAGNOSIS — D124 Benign neoplasm of descending colon: Secondary | ICD-10-CM

## 2015-11-04 DIAGNOSIS — K635 Polyp of colon: Secondary | ICD-10-CM

## 2015-11-04 DIAGNOSIS — D120 Benign neoplasm of cecum: Secondary | ICD-10-CM

## 2015-11-04 MED ORDER — SODIUM CHLORIDE 0.9 % IV SOLN: 500.0000 mL | INTRAVENOUS | Status: DC

## 2015-11-04 NOTE — Patient Instructions (Signed)
Findings:  Polyps, Diverticulosis, Internal hemorrhoids Recommendations:  Repeat colonoscopy in 5 years, resume previous diet, continue current medications.   YOU HAD AN ENDOSCOPIC PROCEDURE TODAY AT [facility]:   Refer to the procedure report that was given to you for any specific questions about what was found during the examination.  If the procedure report does not answer your questions, please call your gastroenterologist to clarify.  If you requested that your care partner not be given the details of your procedure findings, then the procedure report has been included in a sealed envelope for you to review at your convenience later.  YOU SHOULD EXPECT: Some feelings of bloating in the abdomen. Passage of more gas than usual.  Walking can help get rid of the air that was put into your GI tract during the procedure and reduce the bloating. If you had a lower endoscopy (such as a colonoscopy or flexible sigmoidoscopy) you may notice spotting of blood in your stool or on the toilet paper. If you underwent a bowel prep for your procedure, you may not have a normal bowel movement for a few days.  Please Note:  You might notice some irritation and congestion in your nose or some drainage.  This is from the oxygen used during your procedure.  There is no need for concern and it should clear up in a day or so.  SYMPTOMS TO REPORT IMMEDIATELY:   Following lower endoscopy (colonoscopy or flexible sigmoidoscopy):  Excessive amounts of blood in the stool  Significant tenderness or worsening of abdominal pains  Swelling of the abdomen that is new, acute  Fever of 100F or higher   For urgent or emergent issues, a gastroenterologist can be reached at any hour by calling [phone].   DIET:  We do recommend a small meal at first, but then you may proceed to your regular diet.  Drink plenty of fluids but you should avoid alcoholic beverages for 24 hours.  ACTIVITY:  You should  plan to take it easy for the rest of today and you should NOT DRIVE or use heavy machinery until tomorrow (because of the sedation medicines used during the test).    FOLLOW UP: Our staff will call the number listed on your records the next business day following your procedure to check on you and address any questions or concerns that you may have regarding the information given to you following your procedure. If we do not reach you, we will leave a message.  However, if you are feeling well and you are not experiencing any problems, there is no need to return our call.  We will assume that you have returned to your regular daily activities without incident.  If any biopsies were taken you will be contacted by phone or by letter within the next 1-3 weeks.  Please call us at [phone] if you have not heard about the biopsies in 3 weeks.    SIGNATURES/CONFIDENTIALITY: You and/or your care partner have signed paperwork which will be entered into your electronic medical record.  These signatures attest to the fact that that the information above on your After Visit Summary has been reviewed and is understood.  Full responsibility of the confidentiality of this discharge information lies with you and/or your care-partner.  Please follow all discharge instructions given to you by the recovery room nurse. If you have any questions or problems after discharge please call the number listed above. You will receive a phone call  in the am to see how you are doing and answer any questions you may have. Thank you for choosing [facility] for your health care needs.

## 2015-11-04 NOTE — Op Note (Addendum)
[facility] Patient Name: Cheryl [name] Procedure Date: 11/04/2015 8:35 AM MRN: [id number] Endoscopist: Docia Chuck. Henrene Pastor , MD Age: 75 Referring MD:  Date of Birth: August 17, 1940 Gender: Female Account #: [id number] Procedure:                Colonoscopy, with cold snare polypectomy X3 Indications:              High risk colon cancer surveillance: Personal                            history of adenoma less than 10 mm in size, High                            risk colon cancer surveillance: Personal history of                            sessile serrated colon polyp (less than 10 mm in                            size) with no dysplasia. Previous exams 2002                            (negative) and August 2012 (adenoma and SSP X2) Medicines:                Monitored Anesthesia Care Procedure:                Pre-Anesthesia Assessment:                           - Prior to the procedure, a History and Physical                            was performed, and patient medications and                            allergies were reviewed. The patient's tolerance of                            previous anesthesia was also reviewed. The risks                            and benefits of the procedure and the sedation                            options and risks were discussed with the patient.                            All questions were answered, and informed consent                            was obtained. Prior Anticoagulants: The patient has                            taken no previous anticoagulant or antiplatelet  agents. ASA Grade Assessment: II - A patient with                            mild systemic disease. After reviewing the risks                            and benefits, the patient was deemed in                            satisfactory condition to undergo the procedure.                           After obtaining informed consent, the colonoscope        was passed under direct vision. Throughout the                            procedure, the patient's blood pressure, pulse, and                            oxygen saturations were monitored continuously. The                            Model CF-HQ190L [id number] [id number]) scope was introduced                            through the anus and advanced to the the cecum,                            identified by appendiceal orifice and ileocecal                            valve. The ileocecal valve, appendiceal orifice,                            and rectum were photographed. The quality of the                            bowel preparation was excellent. The colonoscopy                            was performed without difficulty. The patient                            tolerated the procedure well. The bowel preparation                            used was SUPREP. Scope In: 9:01:40 AM Scope Out: 9:19:56 AM Scope Withdrawal Time: 0 hours 13 minutes 45 seconds  Total Procedure Duration: 0 hours 18 minutes 16 seconds  Findings:                 A diffuse area of moderate melanosis was found in  the entire colon.                           Three polyps were found in the descending colon,                            ascending colon and cecum. The polyps were 2 to 4                            mm in size. These polyps were removed with a cold                            snare. Resection and retrieval were complete.                           Multiple medium-mouthed diverticula were found in                            the transverse colon and left colon.                           Internal hemorrhoids were found during retroflexion.                           The exam was otherwise without abnormality on                            direct and retroflexion views. Complications:            No immediate complications. Estimated blood loss:                            None. Estimated Blood Loss:      Estimated blood loss: none. Impression:               - Melanosis in the colon.                           - No specimens collected. Recommendation:           - Repeat colonoscopy in 5 years for surveillance.                           - Patient has a contact number available for                            emergencies. The signs and symptoms of potential                            delayed complications were discussed with the                            patient. Return to normal activities tomorrow.                            Written discharge instructions were provided to the  patient.                           - Resume previous diet.                           - Continue present medications.                           - Await pathology results. Docia Chuck. Henrene Pastor, MD 11/04/2015 9:29:13 AM This report has been signed electronically.

## 2015-11-04 NOTE — Progress Notes (Signed)
Called to room to assist during endoscopic procedure.  Patient ID and intended procedure confirmed with present staff. Received instructions for my participation in the procedure from the performing physician.  

## 2015-11-04 NOTE — Progress Notes (Signed)
Report to PACU, RN, vss, BBS= Clear.  

## 2015-11-05 ENCOUNTER — Telehealth: Payer: Self-pay | Admitting: *Deleted

## 2015-11-05 NOTE — Telephone Encounter (Signed)
  Follow up Call-  Call back number 11/04/2015  Post procedure Call Back phone  # [phone] cell  Permission to leave phone message Yes  Some recent data might be hidden     Patient questions:   Do you have a fever, pain , or abdominal swelling? No. Pain Score  0 *  Have you tolerated food without any problems? Yes.    Have you been able to return to your normal activities? Yes.    Do you have any questions about your discharge instructions: Diet   No. Medications  No. Follow up visit  No.  Do you have questions or concerns about your Care? No.  Actions: * If pain score is 4 or above: No action needed, pain <4.

## 2015-11-10 ENCOUNTER — Encounter: Payer: Self-pay | Admitting: Internal Medicine

## 2015-11-13 ENCOUNTER — Encounter: Payer: Self-pay | Admitting: Family Medicine

## 2015-11-24 ENCOUNTER — Other Ambulatory Visit: Payer: Self-pay

## 2015-11-24 ENCOUNTER — Encounter: Payer: Self-pay | Admitting: Sports Medicine

## 2015-11-24 ENCOUNTER — Encounter: Payer: Self-pay | Admitting: Family Medicine

## 2015-11-24 ENCOUNTER — Ambulatory Visit (INDEPENDENT_AMBULATORY_CARE_PROVIDER_SITE_OTHER): Payer: Commercial Managed Care - HMO | Admitting: Sports Medicine

## 2015-11-24 DIAGNOSIS — L603 Nail dystrophy: Secondary | ICD-10-CM | POA: Insufficient documentation

## 2015-11-24 DIAGNOSIS — M8588 Other specified disorders of bone density and structure, other site: Secondary | ICD-10-CM

## 2015-11-24 DIAGNOSIS — Z1239 Encounter for other screening for malignant neoplasm of breast: Secondary | ICD-10-CM

## 2015-11-24 MED ORDER — DOXYCYCLINE HYCLATE 100 MG PO TABS: 100.0000 mg | ORAL_TABLET | Freq: Two times a day (BID) | ORAL | 0 refills | Status: AC

## 2015-11-24 NOTE — Progress Notes (Signed)
  Subjective:    CC:  Left great toenail pain  HPI:  this is a pleasant 80-year-ld female, sometime ago he had her left great toenail removed it grew back but continues to be painful.  Pain is moderate, persistent without radiation.  Past medical history:  Negative.  See flowsheet/record as well for more information.  Surgical history: Negative.  See flowsheet/record as well for more information.  Family history: Negative.  See flowsheet/record as well for more information.  Social history: Negative.  See flowsheet/record as well for more information.  Allergies, and medications have been entered into the medical record, reviewed, and no changes needed.   Review of Systems: No fevers, chills, night sweats, weight loss, chest pain, or shortness of breath.   Objective:    General: Well Developed, well nourished, and in no acute distress.  Neuro: Alert and oriented x3, extra-ocular muscles intact, sensation grossly intact.  HEENT: Normocephalic, atraumatic, pupils equal round reactive to light, neck supple, no masses, no lymphadenopathy, thyroid nonpalpable.  Skin: Warm and dry, no rashes. Cardiac: Regular rate and rhythm, no murmurs rubs or gallops, no lower extremity edema.  Respiratory: Clear to auscultation bilaterally. Not using accessory muscles, speaking in full sentences. Left Foot: Mild ingrown left great toenail. Range of motion is full in all directions. Strength is 5/5 in all directions. No hallux valgus. No pes cavus or pes planus. No abnormal callus noted. No pain over the navicular prominence, or base of fifth metatarsal. No tenderness to palpation of the calcaneal insertion of plantar fascia. No pain at the Achilles insertion. No pain over the calcaneal bursa. No pain of the retrocalcaneal bursa. No tenderness to palpation over the tarsals, metatarsals, or phalanges. No hallux rigidus or limitus. No tenderness palpation over interphalangeal joints. No pain with  compression of the metatarsal heads. Neurovascularly intact distally.  Impression and Recommendations:    Onychodystrophy Recurrence of ingrown left great toenail. Patient will return for a nail plate excision, and phenol treatment. Adding doxycycline to calm things down in the meantime.  I spent 25 minutes with this patient, greater than 50% was face-to-face time counseling regarding the above diagnoses

## 2015-11-24 NOTE — Assessment & Plan Note (Signed)
Recurrence of ingrown left great toenail. Patient will return for a nail plate excision, and phenol treatment. Adding doxycycline to calm things down in the meantime.

## 2015-11-25 ENCOUNTER — Other Ambulatory Visit: Payer: Self-pay | Admitting: Family Medicine

## 2015-11-25 DIAGNOSIS — Z1231 Encounter for screening mammogram for malignant neoplasm of breast: Secondary | ICD-10-CM

## 2015-11-30 ENCOUNTER — Encounter: Payer: Self-pay | Admitting: Sports Medicine

## 2015-11-30 ENCOUNTER — Ambulatory Visit (INDEPENDENT_AMBULATORY_CARE_PROVIDER_SITE_OTHER): Payer: Commercial Managed Care - HMO | Admitting: Sports Medicine

## 2015-11-30 DIAGNOSIS — L603 Nail dystrophy: Secondary | ICD-10-CM | POA: Diagnosis not present

## 2015-11-30 NOTE — Progress Notes (Signed)
  Procedure:  Removal of left great toenail. Risks, benefits, alternatives explained to patient. Consent obtained. Time out conducted. Noted no overlying induration or erythema at site of injection. Toe cleaned with alcohol, then a total of 10cc lidocaine 2% infiltrated at adjacent webspaces at the location of the bifurcation of the common digital nerve to proper digital nerves.  Some lidocaine also infiltrated at hyponychium and under nail bed.  Adequate anesthesia ensured. Toe prepped and draped in a sterile fashion. Nail elevator used to separate nail plate from nail bed. Hemostat then used to separate nail fragment from surrounding structures. Nail bed and matrix treated. Minor bleeding controlled with pressure and phenol. Antibiotic ointment applied. Toe dressed. Advised to return if increased redness, swelling, drainage, fevers, or chills.

## 2015-11-30 NOTE — Assessment & Plan Note (Signed)
Left great toenail removal with phenol. Patient does have some hydrocodone at home she can use for postprocedural pain. Return to see me in one week for a wound check.

## 2015-12-02 ENCOUNTER — Encounter: Payer: Self-pay | Admitting: Sports Medicine

## 2015-12-07 ENCOUNTER — Encounter: Payer: Self-pay | Admitting: Sports Medicine

## 2015-12-07 ENCOUNTER — Ambulatory Visit: Payer: Commercial Managed Care - HMO | Admitting: Sports Medicine

## 2015-12-07 DIAGNOSIS — L603 Nail dystrophy: Secondary | ICD-10-CM

## 2015-12-07 NOTE — Assessment & Plan Note (Signed)
Doing well after left great toenail removal with phenol. Wound is granulating, return as needed.

## 2015-12-07 NOTE — Progress Notes (Signed)
  Subjective:    CC: Follow-up  HPI: 1 week post left great toenail excision, overall doing well. Still with only a bit of pain.  Past medical history:  Negative.  See flowsheet/record as well for more information.  Surgical history: Negative.  See flowsheet/record as well for more information.  Family history: Negative.  See flowsheet/record as well for more information.  Social history: Negative.  See flowsheet/record as well for more information.  Allergies, and medications have been entered into the medical record, reviewed, and no changes needed.   Review of Systems: No fevers, chills, night sweats, weight loss, chest pain, or shortness of breath.   Objective:    General: Well Developed, well nourished, and in no acute distress.  Neuro: Alert and oriented x3, extra-ocular muscles intact, sensation grossly intact.  HEENT: Normocephalic, atraumatic, pupils equal round reactive to light, neck supple, no masses, no lymphadenopathy, thyroid nonpalpable.  Skin: Warm and dry, no rashes. Cardiac: Regular rate and rhythm, no murmurs rubs or gallops, no lower extremity edema.  Respiratory: Clear to auscultation bilaterally. Not using accessory muscles, speaking in full sentences. Left toe: Wound is granulating well. No signs of bacterial infection.  Impression and Recommendations:    Onychodystrophy Doing well after left great toenail removal with phenol. Wound is granulating, return as needed.

## 2015-12-16 ENCOUNTER — Telehealth: Payer: Self-pay

## 2015-12-16 DIAGNOSIS — M549 Dorsalgia, unspecified: Secondary | ICD-10-CM

## 2015-12-16 NOTE — Telephone Encounter (Signed)
Order placed

## 2015-12-16 NOTE — Telephone Encounter (Signed)
Ok to place referral.

## 2015-12-16 NOTE — Telephone Encounter (Addendum)
Pt is asking for a referral to [place] [facility] for back pain. Dr. [name].  Please advise.

## 2015-12-22 ENCOUNTER — Ambulatory Visit (INDEPENDENT_AMBULATORY_CARE_PROVIDER_SITE_OTHER): Payer: Commercial Managed Care - HMO | Admitting: Family Medicine

## 2015-12-22 ENCOUNTER — Encounter: Payer: Self-pay | Admitting: Family Medicine

## 2015-12-22 VITALS — BP 118/78 | HR 68 | Ht 65.0 in | Wt 189.0 lb

## 2015-12-22 DIAGNOSIS — Z23 Encounter for immunization: Secondary | ICD-10-CM

## 2015-12-22 DIAGNOSIS — E785 Hyperlipidemia, unspecified: Secondary | ICD-10-CM

## 2015-12-22 DIAGNOSIS — R590 Localized enlarged lymph nodes: Secondary | ICD-10-CM | POA: Diagnosis not present

## 2015-12-22 DIAGNOSIS — Z0000 Encounter for general adult medical examination without abnormal findings: Secondary | ICD-10-CM | POA: Diagnosis not present

## 2015-12-22 DIAGNOSIS — K760 Fatty (change of) liver, not elsewhere classified: Secondary | ICD-10-CM

## 2015-12-22 LAB — LIPID PANEL
Cholesterol: 200 mg/dL — ABNORMAL HIGH (ref <200)
HDL: 43 mg/dL — ABNORMAL LOW (ref >50)
LDL Cholesterol: 106 mg/dL — ABNORMAL HIGH (ref <100)
Total CHOL/HDL Ratio: 4.7 Ratio (ref <5.0)
Triglycerides: 254 mg/dL — ABNORMAL HIGH (ref <150)
VLDL: 51 mg/dL — ABNORMAL HIGH (ref <30)

## 2015-12-22 LAB — COMPLETE METABOLIC PANEL WITH GFR
ALT: 20 U/L (ref 6–29)
AST: 30 U/L (ref 10–35)
Albumin: 4.5 g/dL (ref 3.6–5.1)
Alkaline Phosphatase: 80 U/L (ref 33–130)
BUN: 16 mg/dL (ref 7–25)
CO2: 29 mmol/L (ref 20–31)
Calcium: 10.4 mg/dL (ref 8.6–10.4)
Chloride: 102 mmol/L (ref 98–110)
Creat: 0.97 mg/dL — ABNORMAL HIGH (ref 0.60–0.93)
GFR, Est African American: 67 mL/min (ref >=60)
GFR, Est Non African American: 58 mL/min — ABNORMAL LOW (ref >=60)
Glucose, Bld: 92 mg/dL (ref 65–99)
Potassium: 4.8 mmol/L (ref 3.5–5.3)
Sodium: 139 mmol/L (ref 135–146)
Total Bilirubin: 0.5 mg/dL (ref 0.2–1.2)
Total Protein: 7.3 g/dL (ref 6.1–8.1)

## 2015-12-22 LAB — CBC WITH DIFFERENTIAL/PLATELET
Basophils Absolute: 0 cells/uL (ref 0–200)
Basophils Relative: 0 %
Eosinophils Absolute: 76 cells/uL (ref 15–500)
Eosinophils Relative: 1 %
HCT: 42.6 % (ref 35.0–45.0)
Hemoglobin: 13.8 g/dL (ref 11.7–15.5)
Lymphocytes Relative: 33 %
Lymphs Abs: 2508 cells/uL (ref 850–3900)
MCH: 26.9 pg — ABNORMAL LOW (ref 27.0–33.0)
MCHC: 32.4 g/dL (ref 32.0–36.0)
MCV: 83 fL (ref 80.0–100.0)
MPV: 9.7 fL (ref 7.5–12.5)
Monocytes Absolute: 608 cells/uL (ref 200–950)
Monocytes Relative: 8 %
Neutro Abs: 4408 cells/uL (ref 1500–7800)
Neutrophils Relative %: 58 %
Platelets: 287 10*3/uL (ref 140–400)
RBC: 5.13 MIL/uL — ABNORMAL HIGH (ref 3.80–5.10)
RDW: 14.7 % (ref 11.0–15.0)
WBC: 7.6 10*3/uL (ref 3.8–10.8)

## 2015-12-22 LAB — TSH: TSH: 3.32 mIU/L

## 2015-12-22 NOTE — Patient Instructions (Signed)
Keep up a regular exercise program and make sure you are eating a healthy diet Try to eat 4 servings of dairy a day, or if you are lactose intolerant take a calcium with vitamin D daily.  Your vaccines are up to date.   

## 2015-12-22 NOTE — Progress Notes (Signed)
Subjective:   Cheryl [name] is a 75 y.o. female who presents for Medicare Annual (Subsequent) preventive examination.  She does have several concerns today. She was recently diagnosed with a fatty liver and she does have some questions about that and what to do to improve it. She did start taking milk thistle over-the-counter to help with her liver. She also feels like she has some swollen lymph nodes underneath her neck. She says she'll notice that they are less swollen in the morning and more swollen by the evening. That she denies any pain discomfort tenderness. No recent sore throat or cold symptoms.  Review of Systems:  Comprehensive ROS is negative.         Objective:     Vitals: BP 118/78   Pulse 68   Ht 5\' 5"  (1.651 m)   Wt 189 lb (85.7 kg)   BMI 31.45 kg/m   Body mass index is 31.45 kg/m.  Physical Exam  Constitutional: She is oriented to person, place, and time. She appears well-developed and well-nourished.  HENT:  Head: Normocephalic and atraumatic.  Right Ear: External ear normal.  Left Ear: External ear normal.  Nose: Nose normal.  Mouth/Throat: Oropharynx is clear and moist.  TMs and canals are clear.   Eyes: Conjunctivae and EOM are normal. Pupils are equal, round, and reactive to light.  Neck: Neck supple. No thyromegaly present.  Cardiovascular: Normal rate, regular rhythm and normal heart sounds.   Pulmonary/Chest: Effort normal and breath sounds normal. She has no wheezes.  Abdominal: Soft. Bowel sounds are normal.  Lymphadenopathy:    She has no cervical adenopathy.  Neurological: She is alert and oriented to person, place, and time.  Skin: Skin is warm and dry.  Psychiatric: She has a normal mood and affect. Her behavior is normal. Thought content normal.     Tobacco History  Smoking Status  . Former Smoker  . Quit date: 02/07/1993  Smokeless Tobacco  . Never Used     Counseling given: Not Answered   Past Medical History:  Diagnosis  Date  . Adenomatous colon polyp 2012  . Allergy   . Anemia   . Anxiety   . Arthritis   . Bone spur   . Bronchitis    saw md 10-05-10  . Bulging discs   . Cataract    bil cataracts removed  . Constipation   . Cyst, kidney, acquired    left kidney  . Depression   . Diverticulosis   . GERD (gastroesophageal reflux disease)   . Hepatitis 2012  . Hyperlipidemia   . Internal hemorrhoids   . Irritable bowel syndrome   . Lung nodules   . Migraine headache   . Osteopenia   . Pancreatitis   . Sigmoid diverticulitis   . Spinal stenosis   . Uterine cancer ([place]) 1973   Past Surgical History:  Procedure Laterality Date  . APPENDECTOMY  1966  . cataracts     bilateral  . COLONOSCOPY    . EUS  02/10/2011   Procedure: UPPER ENDOSCOPIC ULTRASOUND (EUS) RADIAL;  Surgeon: Owens Loffler, MD;  Location: [facility];  Service: Endoscopy;  Laterality: N/A;  radial linear   . RECTOCELE REPAIR  2011  . ROTATOR CUFF REPAIR  2009   left  . TONSILLECTOMY    . UPPER GASTROINTESTINAL ENDOSCOPY    . VAGINAL HYSTERECTOMY  1973   Family History  Problem Relation Age of Onset  . Alcohol abuse Father   .  Lung cancer Father     smoker  . Kidney failure Mother     med induced  . Rheum arthritis Mother   . Depression Brother   . Heart disease      Paternal family   . Heart failure Daughter   . Lung cancer Son   . Crohn's disease Brother   . Alcohol abuse Brother   . HIV Son   . Malignant hyperthermia Neg Hx   . Colon cancer Neg Hx   . Esophageal cancer Neg Hx   . Rectal cancer Neg Hx   . Stomach cancer Neg Hx    History  Sexual Activity  . Sexual activity: Not on file    Outpatient Encounter Prescriptions as of 12/22/2015  Medication Sig  . ALPRAZolam (XANAX) 0.5 MG tablet Take 1 tablet (0.5 mg total) by mouth at bedtime as needed for anxiety.  Marland Kitchen aspirin 81 MG tablet Take 81 mg by mouth daily.    . Cholecalciferol (VITAMIN D) 1000 UNITS capsule Take 1,000 Units by mouth daily.     . Cyanocobalamin (B-12 PO) Take 1 capsule by mouth daily.  . fluticasone (FLONASE) 50 MCG/ACT nasal spray USE 2 SPRAYS IN EACH NOSTRIL EVERY DAY  . MAGNESIUM PO Take by mouth.  . Melatonin 5 MG CAPS Take by mouth daily. TAKE 1 CAPSULE AT BEDTIME  . MILK THISTLE EXTRACT PO Take by mouth daily.  Marland Kitchen omeprazole (PRILOSEC) 20 MG capsule TAKE 1 CAPSULE TWICE DAILY BEFORE A MEAL  . PARoxetine (PAXIL) 20 MG tablet TAKE 1 TABLET EVERY MORNING  . PREMARIN vaginal cream   . RESTASIS 0.05 % ophthalmic emulsion Place 1 drop into both eyes 2 (two) times daily.   . simvastatin (ZOCOR) 40 MG tablet TAKE 1 TABLET AT BEDTIME  . traMADol (ULTRAM) 50 MG tablet Take 1 tablet (50 mg total) by mouth 2 (two) times daily as needed.   Facility-Administered Encounter Medications as of 12/22/2015  Medication  . 0.9 %  sodium chloride infusion    Activities of Daily Living In your present state of health, do you have any difficulty performing the following activities: 12/22/2015  Hearing? Y  Vision? N  Difficulty concentrating or making decisions? Y  Walking or climbing stairs? N  Dressing or bathing? N  Doing errands, shopping? N  Some recent data might be hidden    Patient Care Team: Hali Marry, MD as PCP - General Irene Shipper, MD as Attending Physician (Gastroenterology) Clydell Hakim, MD as Attending Physician (Pain Medicine) Calvert Cantor, MD as Consulting Physician (Ophthalmology)    Assessment:    Medicare Wellness Exam   Exercise Activities and Dietary recommendations Current Exercise Habits: Home exercise routine, Type of exercise: walking, Frequency (Times/Week): 5, Intensity: Moderate  Goals    None     Fall Risk Fall Risk  12/22/2015 12/17/2013 12/20/2012 11/22/2012  Falls in the past year? No No Yes No  Number falls in past yr: - - 2 or more -   Depression Screen PHQ 2/9 Scores 12/22/2015 12/17/2013 12/20/2012 11/23/2012  PHQ - 2 Score 0 0 0 1  PHQ- 9 Score - - - 5      Cognitive Function     6CIT Screen 12/22/2015  What Year? 0 points  What month? 0 points  What time? 0 points  Count back from 20 0 points  Months in reverse 0 points  Repeat phrase 0 points  Total Score 0    Immunization History  Administered Date(s)  Administered  . Hep A / Hep B 12/22/2015  . Influenza Split 12/20/2011  . Influenza Whole 11/08/2004, 11/08/2005, 11/24/2006, 11/12/2007, 12/15/2008, 11/27/2009  . Influenza,inj,Quad PF,36+ Mos 10/10/2012, 10/01/2013, 12/31/2014, 10/05/2015  . Pneumococcal Conjugate-13 12/17/2013  . Pneumococcal Polysaccharide-23 02/07/2005, 12/15/2010  . Td 02/07/2002  . Tdap 10/10/2012  . Zoster 11/27/2009   Screening Tests Health Maintenance  Topic Date Due  . MAMMOGRAM  12/29/2016  . COLONOSCOPY  11/03/2020  . TETANUS/TDAP  10/11/2022  . INFLUENZA VACCINE  Addressed  . DEXA SCAN  Completed  . ZOSTAVAX  Completed  . PNA vac Low Risk Adult  Completed      Plan:    During the course of the visit the patient was educated and counseled about the following appropriate screening and preventive services:   Vaccines to include Pneumoccal, Influenza, Hepatitis B, Td, Zostavax, HCV  Cardiovascular Disease  Colorectal cancer screening  Bone density screening  Diabetes screening  Glaucoma screening  Mammography/PAP  Fatty liver-discussed the importance of healthy diet weight loss and exercise. Explained that losing weight externally also causes the fat decrease in the liver which reduce inflammation and scarring which can eventually lead to cirrhosis. She asked if she would benefit from hepatitis injections. I explained that we definitely do not want someone has cirrhosis but we could certainly consider doing it at the stage of fatty liver disease. Given her first Twinrix injection today. We also discussed avoiding things such as excess Tylenol or alcohol products to reduce injury to the liver.  Cervical lymphadenopathy-gave  reassurance. They're fairly small and are soft and nontender on exam. Continue to monitor. We'll do a CBC on her lab work today.  Patient Instructions (the written plan) was given to the patient.   Akiva Brassfield, MD  12/22/2015

## 2016-01-11 ENCOUNTER — Encounter: Payer: Self-pay | Admitting: Family Medicine

## 2016-01-11 MED ORDER — ZOLPIDEM TARTRATE 5 MG PO TABS: 5.0000 mg | ORAL_TABLET | Freq: Every evening | ORAL | 1 refills | Status: DC | PRN

## 2016-01-11 NOTE — Telephone Encounter (Signed)
Routing to PCP, Pt has not been on requested Rx since 2015.

## 2016-01-18 ENCOUNTER — Ambulatory Visit
Admission: RE | Admit: 2016-01-18 | Discharge: 2016-01-18 | Disposition: A | Discharge status: Home / Self Care | Payer: Commercial Managed Care - HMO | Source: Ambulatory Visit | Attending: Family Medicine | Admitting: Family Medicine

## 2016-01-18 DIAGNOSIS — M8588 Other specified disorders of bone density and structure, other site: Secondary | ICD-10-CM

## 2016-01-18 DIAGNOSIS — Z1231 Encounter for screening mammogram for malignant neoplasm of breast: Secondary | ICD-10-CM

## 2016-01-19 ENCOUNTER — Encounter: Payer: Self-pay | Admitting: Family Medicine

## 2016-01-19 ENCOUNTER — Other Ambulatory Visit: Payer: Self-pay | Admitting: Family Medicine

## 2016-01-19 ENCOUNTER — Ambulatory Visit (INDEPENDENT_AMBULATORY_CARE_PROVIDER_SITE_OTHER): Payer: Commercial Managed Care - HMO | Admitting: Family Medicine

## 2016-01-19 VITALS — BP 114/67 | HR 86 | Temp 97.8°F

## 2016-01-19 DIAGNOSIS — K760 Fatty (change of) liver, not elsewhere classified: Secondary | ICD-10-CM

## 2016-01-19 DIAGNOSIS — Z23 Encounter for immunization: Secondary | ICD-10-CM | POA: Diagnosis not present

## 2016-01-19 MED ORDER — HYDROCODONE-HOMATROPINE 5-1.5 MG/5ML PO SYRP: 5.0000 mL | ORAL_SOLUTION | Freq: Three times a day (TID) | ORAL | 0 refills | Status: AC | PRN

## 2016-01-19 MED ORDER — IPRATROPIUM BROMIDE 0.03 % NA SOLN: 2.0000 | Freq: Two times a day (BID) | NASAL | 3 refills | Status: DC

## 2016-01-19 NOTE — Progress Notes (Signed)
Agree with above.  Austyn Perriello, MD  

## 2016-01-19 NOTE — Progress Notes (Signed)
Patient came into clinic today for second Hep A/B vaccine. Pt reports she is getting these because she has a fatty liver and her PCP recommended administration. Pt reports no negative side effects from the first vaccine. Pt tolerated injection in right deltoid well today, no immediate complications. Pt already has an appt in May 2018 for third and final vaccine in series. No further questions/concerns.

## 2016-01-21 ENCOUNTER — Encounter: Payer: Self-pay | Admitting: Family Medicine

## 2016-01-27 ENCOUNTER — Ambulatory Visit: Payer: Commercial Managed Care - HMO | Admitting: Osteopathic Medicine

## 2016-01-28 ENCOUNTER — Ambulatory Visit: Payer: Commercial Managed Care - HMO | Admitting: Osteopathic Medicine

## 2016-02-03 ENCOUNTER — Other Ambulatory Visit: Payer: Self-pay | Admitting: Neurological Surgery

## 2016-02-22 ENCOUNTER — Encounter: Payer: Self-pay | Admitting: Family Medicine

## 2016-03-07 ENCOUNTER — Encounter (HOSPITAL_COMMUNITY): Payer: Self-pay

## 2016-03-07 ENCOUNTER — Encounter (HOSPITAL_COMMUNITY)
Admission: RE | Admit: 2016-03-07 | Discharge: 2016-03-07 | Disposition: A | Discharge status: Home / Self Care | Payer: PPO | Source: Ambulatory Visit | Attending: Neurological Surgery | Admitting: Neurological Surgery

## 2016-03-07 DIAGNOSIS — M47896 Other spondylosis, lumbar region: Secondary | ICD-10-CM | POA: Insufficient documentation

## 2016-03-07 DIAGNOSIS — Z01812 Encounter for preprocedural laboratory examination: Secondary | ICD-10-CM | POA: Insufficient documentation

## 2016-03-07 HISTORY — DX: Personal history of other diseases of the respiratory system: Z87.09

## 2016-03-07 HISTORY — DX: Personal history of colon polyps, unspecified: Z86.0100

## 2016-03-07 HISTORY — DX: Personal history of colonic polyps: Z86.010

## 2016-03-07 HISTORY — DX: Other chronic pain: M54.9

## 2016-03-07 HISTORY — DX: Effusion, unspecified joint: M25.40

## 2016-03-07 HISTORY — DX: Pneumonia, unspecified organism: J18.9

## 2016-03-07 HISTORY — DX: Pain in unspecified joint: M25.50

## 2016-03-07 HISTORY — DX: Fatty (change of) liver, not elsewhere classified: K76.0

## 2016-03-07 HISTORY — DX: Irritable bowel syndrome, unspecified: K58.9

## 2016-03-07 HISTORY — DX: Other chronic pain: G89.29

## 2016-03-07 HISTORY — DX: Dizziness and giddiness: R42

## 2016-03-07 HISTORY — DX: Cyst of kidney, acquired: N28.1

## 2016-03-07 HISTORY — DX: Personal history of other infectious and parasitic diseases: Z86.19

## 2016-03-07 HISTORY — DX: Insomnia, unspecified: G47.00

## 2016-03-07 LAB — CBC
HCT: 42.9 % (ref 36.0–46.0)
Hemoglobin: 13.5 g/dL (ref 12.0–15.0)
MCH: 26.2 pg (ref 26.0–34.0)
MCHC: 31.5 g/dL (ref 30.0–36.0)
MCV: 83.1 fL (ref 78.0–100.0)
Platelets: 244 10*3/uL (ref 150–400)
RBC: 5.16 MIL/uL — ABNORMAL HIGH (ref 3.87–5.11)
RDW: 14.3 % (ref 11.5–15.5)
WBC: 6.9 10*3/uL (ref 4.0–10.5)

## 2016-03-07 LAB — TYPE AND SCREEN
ABO/RH(D): A POS
Antibody Screen: NEGATIVE

## 2016-03-07 LAB — COMPREHENSIVE METABOLIC PANEL
ALT: 22 U/L (ref 14–54)
AST: 27 U/L (ref 15–41)
Albumin: 3.9 g/dL (ref 3.5–5.0)
Alkaline Phosphatase: 83 U/L (ref 38–126)
Anion gap: 9 (ref 5–15)
BUN: 13 mg/dL (ref 6–20)
CO2: 25 mmol/L (ref 22–32)
Calcium: 9.7 mg/dL (ref 8.9–10.3)
Chloride: 104 mmol/L (ref 101–111)
Creatinine, Ser: 0.92 mg/dL (ref 0.44–1.00)
GFR calc Af Amer: >60 mL/min (ref >60)
GFR calc non Af Amer: >60 mL/min (ref >60)
Glucose, Bld: 117 mg/dL — ABNORMAL HIGH (ref 65–99)
Potassium: 4.3 mmol/L (ref 3.5–5.1)
Sodium: 138 mmol/L (ref 135–145)
Total Bilirubin: 0.6 mg/dL (ref 0.3–1.2)
Total Protein: 7 g/dL (ref 6.5–8.1)

## 2016-03-07 LAB — SURGICAL PCR SCREEN
MRSA, PCR: NEGATIVE
Staphylococcus aureus: NEGATIVE

## 2016-03-07 LAB — ABO/RH: ABO/RH(D): A POS

## 2016-03-07 MED ORDER — CHLORHEXIDINE GLUCONATE CLOTH 2 % EX PADS: 6.0000 | MEDICATED_PAD | Freq: Once | CUTANEOUS | Status: DC

## 2016-03-07 NOTE — Progress Notes (Signed)
Cardiologist is [place] with last visit in epic from 06-05-15. To Follow up 05/2016  Echo report in epic from 05-28-15  Stress test report in epic from 05-07-15  Heart cath denies  EKG in epic from 04-10-15  CXR denies in past yr

## 2016-03-07 NOTE — Pre-Procedure Instructions (Signed)
Laural Golden  03/07/2016      [address], [place] [address] Phone: [phone] Fax: [phone]  [id number] [address], [address] [place] [address] Phone: [phone] Fax: [phone]    Your procedure is scheduled on Tues, Feb 6 @ 7:30 AM  Report to [facility] at 5:30 AM  Call this number if you have problems the morning of surgery:  [phone]   Remember:  Do not eat food or drink liquids after midnight.  Take these medicines the morning of surgery with A SIP OF WATER Xanax(Alprazolam),Flonase(Fluticasone),Omeprazole(Prilosec),Eye Drops,Paxil(Paroxetine),and Tramadol(Ultram-if needed)             A week prior to surgery stop taking any Vitamins or Herbal Medications. No Goody's,BC's,Aleve,Advil,Motrin,Ibuprofen,or Fish Oil.    Do not wear jewelry, make-up or nail polish.  Do not wear lotions, powders,perfumes, or deoderant.  Do not shave 48 hours prior to surgery.    Do not bring valuables to the hospital.  [facility] is not responsible for any belongings or valuables.  Contacts, dentures or bridgework may not be worn into surgery.  Leave your suitcase in the car.  After surgery it may be brought to your room.  For patients admitted to the hospital, discharge time will be determined by your treatment team.  Patients discharged the day of surgery will not be allowed to drive home.    [facility]  Before surgery, you can play an important role.  Because skin is not sterile, your skin needs to be as free of germs as possible.  You can reduce the number of germs on you skin by washing with CHG (chlorahexidine gluconate) soap before surgery.  CHG is an antiseptic cleaner which kills germs and bonds with the skin to continue killing germs even after washing.  Please DO NOT use if you have an  allergy to CHG or antibacterial soaps.  If your skin becomes reddened/irritated stop using the CHG and inform your nurse when you arrive at Short Stay.  Do not shave (including legs and underarms) for at least 48 hours prior to the first CHG shower.  You may shave your face.  Please follow these instructions carefully:   1.  Shower with CHG Soap the night before surgery and the                                morning of Surgery.  2.  If you choose to wash your hair, wash your hair first as usual with your       normal shampoo.  3.  After you shampoo, rinse your hair and body thoroughly to remove the                      Shampoo.  4.  Use CHG as you would any other liquid soap.  You can apply chg directly       to the skin and wash gently with scrungie or a clean washcloth.  5.  Apply the CHG Soap to your body ONLY FROM THE NECK DOWN.        Do not use on open wounds or open sores.  Avoid contact with your eyes,       ears, mouth and genitals (private parts).  Wash genitals (private parts)       with your normal soap.  6.  Wash thoroughly, paying special attention to the area where your surgery        will be performed.  7.  Thoroughly rinse your body with warm water from the neck down.  8.  DO NOT shower/wash with your normal soap after using and rinsing off       the CHG Soap.  9.  Pat yourself dry with a clean towel.            10.  Wear clean pajamas.            11.  Place clean sheets on your bed the night of your first shower and do not        sleep with pets.  Day of Surgery  Do not apply any lotions/deoderants the morning of surgery.  Please wear clean clothes to the hospital/surgery center.   Please read over the following fact sheets that you were given. Pain Booklet, Coughing and Deep Breathing, MRSA Information and Surgical Site Infection Prevention

## 2016-03-08 NOTE — Progress Notes (Signed)
Anesthesia Chart Review:  Pt is a 76 year old female scheduled for P4-5 PLIF on 03/15/2016 with [name], MD.   - PCP is [name], MD - Pt saw cardiologist is [name], MD on 06/05/15 for eval of chest pain. Echo and stress test results below.   PMH includes:  Hyperlipidemia, hepatitis, fatty liver, anemia, lung nodules (stable by 2015 chest CT, no further work up indicated), uterine cancer, GERD. Former smoker. BMI 32.  Medications include: ASA, Atrovent, Prilosec, simvastatin.  Preoperative labs reviewed.    EKG 04/10/15: NSR.   Echo 05/28/15:  - Left ventricle: The cavity size was normal. Wall thickness was normal. Systolic function was normal. The estimated ejection fraction was in the range of 55% to 60%. Wall motion was normal; there were no regional wall motion abnormalities. Doppler parameters are consistent with abnormal left ventricular relaxation (grade 1 diastolic dysfunction).  Nuclear stress test 05/07/15: Small size, moderate intensity fixed septal/inferoseptal perfusion defect which is likely artifact. No reversible ischemia. LVEF 60% with normal wall motion. This is a low risk study.  Carotid duplex 02/17/13: Minimal amount of bilateral atherosclerotic plaque, right subjectively greater than left, not resulting in a hemodynamically significant stenosis.  If no changes, I anticipate pt can proceed with surgery as scheduled.   Willeen Cass, FNP-BC [facility]/Anesthesiology Phone: [phone] 03/08/2016 4:15 PM

## 2016-03-10 HISTORY — PX: LUMBAR FUSION: SHX111

## 2016-03-14 NOTE — Anesthesia Preprocedure Evaluation (Addendum)
Anesthesia Evaluation  Patient identified by MRN, date of birth, ID band Patient awake    Reviewed: Allergy & Precautions, NPO status , Patient's Chart, lab work & pertinent test results  Airway Mallampati: II  TM Distance: >3 FB Neck ROM: Full    Dental  (+) Dental Advisory Given   Pulmonary former smoker,    breath sounds clear to auscultation       Cardiovascular negative cardio ROS   Rhythm:Regular Rate:Normal  Echo 05/28/15:  - Left ventricle: The cavity size was normal. Wall thickness wasnormal. Systolic function was normal. The estimated ejectionfraction was in the range of 55% to 60%. Wall motion was normal;there were no regional wall motion abnormalities. Dopplerparameters are consistent with abnormal left ventricularrelaxation (grade 1 diastolic dysfunction).  Nuclear stress test 05/07/15: Small size, moderate intensity fixed septal/inferoseptal perfusion defect which is likely artifact. No reversible ischemia. LVEF 60% with normal wall motion. This is a low risk study.   Neuro/Psych  Headaches, Anxiety Depression    GI/Hepatic Neg liver ROS, GERD  ,  Endo/Other  negative endocrine ROS  Renal/GU Renal disease     Musculoskeletal  (+) Arthritis ,   Abdominal   Peds  Hematology negative hematology ROS (+)   Anesthesia Other Findings   Reproductive/Obstetrics                            Lab Results  Component Value Date   WBC 6.9 03/07/2016   HGB 13.5 03/07/2016   HCT 42.9 03/07/2016   MCV 83.1 03/07/2016   PLT 244 03/07/2016   Lab Results  Component Value Date   CREATININE 0.92 03/07/2016   BUN 13 03/07/2016   NA 138 03/07/2016   K 4.3 03/07/2016   CL 104 03/07/2016   CO2 25 03/07/2016   No results found for: INR, PROTIME  Anesthesia Physical Anesthesia Plan  ASA: II  Anesthesia Plan: General   Post-op Pain Management:    Induction: Intravenous  Airway  Management Planned: Oral ETT  Additional Equipment:   Intra-op Plan:   Post-operative Plan: Extubation in OR  Informed Consent: I have reviewed the patients History and Physical, chart, labs and discussed the procedure including the risks, benefits and alternatives for the proposed anesthesia with the patient or authorized representative who has indicated his/her understanding and acceptance.   Dental advisory given  Plan Discussed with: CRNA  Anesthesia Plan Comments:        Anesthesia Quick Evaluation

## 2016-03-15 ENCOUNTER — Inpatient Hospital Stay (HOSPITAL_COMMUNITY)
Admission: RE | Admit: 2016-03-15 | Discharge: 2016-03-17 | DRG: 455 | Disposition: A | Discharge status: Home Health | Payer: PPO | Source: Ambulatory Visit | Attending: Neurological Surgery | Admitting: Neurological Surgery

## 2016-03-15 ENCOUNTER — Inpatient Hospital Stay (HOSPITAL_COMMUNITY): Payer: PPO | Admitting: Anesthesiology

## 2016-03-15 ENCOUNTER — Inpatient Hospital Stay (HOSPITAL_COMMUNITY): Payer: PPO

## 2016-03-15 ENCOUNTER — Encounter (HOSPITAL_COMMUNITY)
Admission: RE | Disposition: A | Discharge status: Home Health | Payer: Self-pay | Source: Ambulatory Visit | Attending: Neurological Surgery

## 2016-03-15 ENCOUNTER — Inpatient Hospital Stay (HOSPITAL_COMMUNITY): Payer: PPO | Admitting: Emergency Medicine

## 2016-03-15 ENCOUNTER — Encounter (HOSPITAL_COMMUNITY): Payer: Self-pay | Admitting: Urology

## 2016-03-15 DIAGNOSIS — M5116 Intervertebral disc disorders with radiculopathy, lumbar region: Secondary | ICD-10-CM | POA: Diagnosis present

## 2016-03-15 DIAGNOSIS — Z8261 Family history of arthritis: Secondary | ICD-10-CM

## 2016-03-15 DIAGNOSIS — G8929 Other chronic pain: Secondary | ICD-10-CM | POA: Diagnosis present

## 2016-03-15 DIAGNOSIS — Z8619 Personal history of other infectious and parasitic diseases: Secondary | ICD-10-CM | POA: Diagnosis not present

## 2016-03-15 DIAGNOSIS — Z86010 Personal history of colonic polyps: Secondary | ICD-10-CM | POA: Diagnosis not present

## 2016-03-15 DIAGNOSIS — F419 Anxiety disorder, unspecified: Secondary | ICD-10-CM | POA: Diagnosis present

## 2016-03-15 DIAGNOSIS — K5909 Other constipation: Secondary | ICD-10-CM | POA: Diagnosis not present

## 2016-03-15 DIAGNOSIS — Z8249 Family history of ischemic heart disease and other diseases of the circulatory system: Secondary | ICD-10-CM

## 2016-03-15 DIAGNOSIS — Z818 Family history of other mental and behavioral disorders: Secondary | ICD-10-CM | POA: Diagnosis not present

## 2016-03-15 DIAGNOSIS — K589 Irritable bowel syndrome without diarrhea: Secondary | ICD-10-CM | POA: Diagnosis present

## 2016-03-15 DIAGNOSIS — K219 Gastro-esophageal reflux disease without esophagitis: Secondary | ICD-10-CM | POA: Diagnosis not present

## 2016-03-15 DIAGNOSIS — Z7951 Long term (current) use of inhaled steroids: Secondary | ICD-10-CM

## 2016-03-15 DIAGNOSIS — Z87891 Personal history of nicotine dependence: Secondary | ICD-10-CM | POA: Diagnosis not present

## 2016-03-15 DIAGNOSIS — E785 Hyperlipidemia, unspecified: Secondary | ICD-10-CM | POA: Diagnosis not present

## 2016-03-15 DIAGNOSIS — Z841 Family history of disorders of kidney and ureter: Secondary | ICD-10-CM | POA: Diagnosis not present

## 2016-03-15 DIAGNOSIS — Z882 Allergy status to sulfonamides status: Secondary | ICD-10-CM

## 2016-03-15 DIAGNOSIS — Z91041 Radiographic dye allergy status: Secondary | ICD-10-CM

## 2016-03-15 DIAGNOSIS — K760 Fatty (change of) liver, not elsewhere classified: Secondary | ICD-10-CM | POA: Diagnosis present

## 2016-03-15 DIAGNOSIS — F329 Major depressive disorder, single episode, unspecified: Secondary | ICD-10-CM | POA: Diagnosis present

## 2016-03-15 DIAGNOSIS — Z801 Family history of malignant neoplasm of trachea, bronchus and lung: Secondary | ICD-10-CM

## 2016-03-15 DIAGNOSIS — Z7982 Long term (current) use of aspirin: Secondary | ICD-10-CM

## 2016-03-15 DIAGNOSIS — Z885 Allergy status to narcotic agent status: Secondary | ICD-10-CM

## 2016-03-15 DIAGNOSIS — M48061 Spinal stenosis, lumbar region without neurogenic claudication: Secondary | ICD-10-CM | POA: Diagnosis not present

## 2016-03-15 DIAGNOSIS — Z888 Allergy status to other drugs, medicaments and biological substances status: Secondary | ICD-10-CM

## 2016-03-15 DIAGNOSIS — Z79899 Other long term (current) drug therapy: Secondary | ICD-10-CM

## 2016-03-15 DIAGNOSIS — Z981 Arthrodesis status: Secondary | ICD-10-CM | POA: Diagnosis not present

## 2016-03-15 DIAGNOSIS — M4316 Spondylolisthesis, lumbar region: Secondary | ICD-10-CM | POA: Diagnosis not present

## 2016-03-15 DIAGNOSIS — Z8542 Personal history of malignant neoplasm of other parts of uterus: Secondary | ICD-10-CM

## 2016-03-15 DIAGNOSIS — M48062 Spinal stenosis, lumbar region with neurogenic claudication: Secondary | ICD-10-CM | POA: Diagnosis not present

## 2016-03-15 DIAGNOSIS — M4726 Other spondylosis with radiculopathy, lumbar region: Secondary | ICD-10-CM | POA: Diagnosis not present

## 2016-03-15 DIAGNOSIS — M549 Dorsalgia, unspecified: Secondary | ICD-10-CM | POA: Diagnosis not present

## 2016-03-15 SURGERY — POSTERIOR LUMBAR FUSION 1 LEVEL
Anesthesia: General | Site: Back

## 2016-03-15 MED ORDER — PHENYLEPHRINE HCL 10 MG/ML IJ SOLN
INTRAMUSCULAR | Status: DC | PRN
  Administered 2016-03-15 (×2): 80 ug via INTRAVENOUS

## 2016-03-15 MED ORDER — SODIUM CHLORIDE 0.9% FLUSH: 3.0000 mL | INTRAVENOUS | Status: DC | PRN

## 2016-03-15 MED ORDER — METHOCARBAMOL 1000 MG/10ML IJ SOLN
500.0000 mg | Freq: Four times a day (QID) | INTRAVENOUS | Status: DC | PRN
  Filled 2016-03-15: qty 5

## 2016-03-15 MED ORDER — METHOCARBAMOL 500 MG PO TABS
ORAL_TABLET | ORAL | Status: AC
  Filled 2016-03-15: qty 1

## 2016-03-15 MED ORDER — SODIUM CHLORIDE 0.9% FLUSH
3.0000 mL | Freq: Two times a day (BID) | INTRAVENOUS | Status: DC
  Administered 2016-03-15: 3 mL via INTRAVENOUS

## 2016-03-15 MED ORDER — FLUTICASONE PROPIONATE 50 MCG/ACT NA SUSP
2.0000 | Freq: Every day | NASAL | Status: DC
  Administered 2016-03-17: 2 via NASAL
  Filled 2016-03-15: qty 16

## 2016-03-15 MED ORDER — ACETAMINOPHEN 650 MG RE SUPP: 650.0000 mg | RECTAL | Status: DC | PRN

## 2016-03-15 MED ORDER — THROMBIN 5000 UNITS EX SOLR
CUTANEOUS | Status: DC | PRN
  Administered 2016-03-15: 08:00:00 via TOPICAL

## 2016-03-15 MED ORDER — LIDOCAINE-EPINEPHRINE (PF) 2 %-1:200000 IJ SOLN
INTRAMUSCULAR | Status: DC | PRN
  Administered 2016-03-15: 5 mL via INTRADERMAL

## 2016-03-15 MED ORDER — ROCURONIUM BROMIDE 100 MG/10ML IV SOLN
INTRAVENOUS | Status: DC | PRN
  Administered 2016-03-15: 50 mg via INTRAVENOUS
  Administered 2016-03-15: 15 mg via INTRAVENOUS

## 2016-03-15 MED ORDER — HYDROMORPHONE HCL 1 MG/ML IJ SOLN
0.2500 mg | INTRAMUSCULAR | Status: DC | PRN
  Administered 2016-03-15 (×3): 0.5 mg via INTRAVENOUS
  Administered 2016-03-15 (×2): 0.25 mg via INTRAVENOUS

## 2016-03-15 MED ORDER — HYDROMORPHONE HCL 1 MG/ML IJ SOLN
INTRAMUSCULAR | Status: AC
  Administered 2016-03-15: 0.5 mg via INTRAVENOUS
  Filled 2016-03-15: qty 1

## 2016-03-15 MED ORDER — THROMBIN 20000 UNITS EX SOLR
CUTANEOUS | Status: AC
  Filled 2016-03-15: qty 20000

## 2016-03-15 MED ORDER — MENTHOL 3 MG MT LOZG: 1.0000 | LOZENGE | OROMUCOSAL | Status: DC | PRN

## 2016-03-15 MED ORDER — 0.9 % SODIUM CHLORIDE (POUR BTL) OPTIME
TOPICAL | Status: DC | PRN
  Administered 2016-03-15: 1000 mL

## 2016-03-15 MED ORDER — ACETAMINOPHEN 325 MG PO TABS: 650.0000 mg | ORAL_TABLET | ORAL | Status: DC | PRN

## 2016-03-15 MED ORDER — DOCUSATE SODIUM 100 MG PO CAPS
100.0000 mg | ORAL_CAPSULE | Freq: Two times a day (BID) | ORAL | Status: DC
  Administered 2016-03-15 – 2016-03-17 (×5): 100 mg via ORAL
  Filled 2016-03-15 (×5): qty 1

## 2016-03-15 MED ORDER — ZOLPIDEM TARTRATE 5 MG PO TABS: 5.0000 mg | ORAL_TABLET | Freq: Every evening | ORAL | Status: DC | PRN

## 2016-03-15 MED ORDER — POLYETHYLENE GLYCOL 3350 17 G PO PACK: 17.0000 g | PACK | Freq: Every day | ORAL | Status: DC | PRN

## 2016-03-15 MED ORDER — SENNA 8.6 MG PO TABS
1.0000 | ORAL_TABLET | Freq: Two times a day (BID) | ORAL | Status: DC
  Administered 2016-03-15 – 2016-03-17 (×4): 8.6 mg via ORAL
  Filled 2016-03-15 (×4): qty 1

## 2016-03-15 MED ORDER — BUPIVACAINE HCL (PF) 0.5 % IJ SOLN
INTRAMUSCULAR | Status: DC | PRN
  Administered 2016-03-15: 5 mL
  Administered 2016-03-15: 10 mL

## 2016-03-15 MED ORDER — THROMBIN 5000 UNITS EX SOLR
CUTANEOUS | Status: AC
  Filled 2016-03-15: qty 5000

## 2016-03-15 MED ORDER — METHOCARBAMOL 500 MG PO TABS
500.0000 mg | ORAL_TABLET | Freq: Four times a day (QID) | ORAL | Status: DC | PRN
  Administered 2016-03-15 – 2016-03-17 (×6): 500 mg via ORAL
  Filled 2016-03-15 (×7): qty 1

## 2016-03-15 MED ORDER — SIMVASTATIN 20 MG PO TABS
40.0000 mg | ORAL_TABLET | Freq: Every day | ORAL | Status: DC
  Administered 2016-03-16: 40 mg via ORAL
  Filled 2016-03-15 (×2): qty 2

## 2016-03-15 MED ORDER — ONDANSETRON HCL 4 MG/2ML IJ SOLN
INTRAMUSCULAR | Status: DC | PRN
  Administered 2016-03-15: 4 mg via INTRAVENOUS

## 2016-03-15 MED ORDER — OXYCODONE-ACETAMINOPHEN 5-325 MG PO TABS
1.0000 | ORAL_TABLET | ORAL | Status: DC | PRN
  Administered 2016-03-15 – 2016-03-17 (×8): 2 via ORAL
  Filled 2016-03-15 (×8): qty 2

## 2016-03-15 MED ORDER — PANTOPRAZOLE SODIUM 40 MG PO TBEC
40.0000 mg | DELAYED_RELEASE_TABLET | Freq: Every day | ORAL | Status: DC
  Administered 2016-03-16 – 2016-03-17 (×2): 40 mg via ORAL
  Filled 2016-03-15 (×2): qty 1

## 2016-03-15 MED ORDER — EPHEDRINE SULFATE 50 MG/ML IJ SOLN
INTRAMUSCULAR | Status: DC | PRN
  Administered 2016-03-15 (×2): 5 mg via INTRAVENOUS
  Administered 2016-03-15 (×2): 10 mg via INTRAVENOUS

## 2016-03-15 MED ORDER — SODIUM CHLORIDE 0.9 % IV SOLN: INTRAVENOUS | Status: DC

## 2016-03-15 MED ORDER — LACTATED RINGERS IV SOLN
INTRAVENOUS | Status: DC | PRN
  Administered 2016-03-15 (×2): via INTRAVENOUS

## 2016-03-15 MED ORDER — HYDROMORPHONE HCL 1 MG/ML IJ SOLN
0.5000 mg | INTRAMUSCULAR | Status: DC | PRN
  Administered 2016-03-15 (×2): 1 mg via INTRAVENOUS
  Filled 2016-03-15 (×2): qty 1

## 2016-03-15 MED ORDER — ALPRAZOLAM 0.5 MG PO TABS: 0.5000 mg | ORAL_TABLET | Freq: Every evening | ORAL | Status: DC | PRN

## 2016-03-15 MED ORDER — PROPOFOL 10 MG/ML IV BOLUS
INTRAVENOUS | Status: AC
  Filled 2016-03-15: qty 20

## 2016-03-15 MED ORDER — SODIUM CHLORIDE 0.9 % IR SOLN
Status: DC | PRN
  Administered 2016-03-15: 08:00:00

## 2016-03-15 MED ORDER — LIDOCAINE-EPINEPHRINE (PF) 2 %-1:200000 IJ SOLN
INTRAMUSCULAR | Status: AC
  Filled 2016-03-15: qty 20

## 2016-03-15 MED ORDER — CYCLOSPORINE 0.05 % OP EMUL
1.0000 [drp] | Freq: Two times a day (BID) | OPHTHALMIC | Status: DC
  Administered 2016-03-15 – 2016-03-17 (×3): 1 [drp] via OPHTHALMIC
  Filled 2016-03-15 (×4): qty 1

## 2016-03-15 MED ORDER — HYDROMORPHONE HCL 1 MG/ML IJ SOLN
INTRAMUSCULAR | Status: AC
  Administered 2016-03-15: 0.25 mg via INTRAVENOUS
  Filled 2016-03-15: qty 0.5

## 2016-03-15 MED ORDER — FENTANYL CITRATE (PF) 100 MCG/2ML IJ SOLN
INTRAMUSCULAR | Status: DC | PRN
  Administered 2016-03-15: 50 ug via INTRAVENOUS
  Administered 2016-03-15: 100 ug via INTRAVENOUS
  Administered 2016-03-15: 50 ug via INTRAVENOUS

## 2016-03-15 MED ORDER — CEFAZOLIN SODIUM-DEXTROSE 2-4 GM/100ML-% IV SOLN
INTRAVENOUS | Status: AC
  Filled 2016-03-15: qty 100

## 2016-03-15 MED ORDER — BUPIVACAINE HCL (PF) 0.5 % IJ SOLN
INTRAMUSCULAR | Status: AC
  Filled 2016-03-15: qty 30

## 2016-03-15 MED ORDER — DEXAMETHASONE 4 MG PO TABS
2.0000 mg | ORAL_TABLET | Freq: Two times a day (BID) | ORAL | Status: DC
  Administered 2016-03-15 – 2016-03-17 (×5): 2 mg via ORAL
  Filled 2016-03-15 (×5): qty 1

## 2016-03-15 MED ORDER — POLYETHYLENE GLYCOL 3350 17 G PO PACK
17.0000 g | PACK | Freq: Every day | ORAL | Status: DC
  Administered 2016-03-16 – 2016-03-17 (×2): 17 g via ORAL
  Filled 2016-03-15 (×2): qty 1

## 2016-03-15 MED ORDER — MAGNESIUM OXIDE 400 (241.3 MG) MG PO TABS
200.0000 mg | ORAL_TABLET | Freq: Every day | ORAL | Status: DC
  Administered 2016-03-15 – 2016-03-17 (×3): 200 mg via ORAL
  Filled 2016-03-15 (×3): qty 0.5

## 2016-03-15 MED ORDER — MIDAZOLAM HCL 2 MG/2ML IJ SOLN
INTRAMUSCULAR | Status: AC
  Filled 2016-03-15: qty 2

## 2016-03-15 MED ORDER — PAROXETINE HCL 20 MG PO TABS
20.0000 mg | ORAL_TABLET | Freq: Every morning | ORAL | Status: DC
  Administered 2016-03-16 – 2016-03-17 (×2): 20 mg via ORAL
  Filled 2016-03-15 (×2): qty 1

## 2016-03-15 MED ORDER — ALUM & MAG HYDROXIDE-SIMETH 200-200-20 MG/5ML PO SUSP: 30.0000 mL | Freq: Four times a day (QID) | ORAL | Status: DC | PRN

## 2016-03-15 MED ORDER — PROMETHAZINE HCL 25 MG/ML IJ SOLN: 6.2500 mg | INTRAMUSCULAR | Status: DC | PRN

## 2016-03-15 MED ORDER — MIDAZOLAM HCL 5 MG/5ML IJ SOLN
INTRAMUSCULAR | Status: DC | PRN
  Administered 2016-03-15: 2 mg via INTRAVENOUS

## 2016-03-15 MED ORDER — SUGAMMADEX SODIUM 200 MG/2ML IV SOLN
INTRAVENOUS | Status: DC | PRN
  Administered 2016-03-15: 200 mg via INTRAVENOUS

## 2016-03-15 MED ORDER — CEFAZOLIN SODIUM-DEXTROSE 2-4 GM/100ML-% IV SOLN
2.0000 g | INTRAVENOUS | Status: AC
  Administered 2016-03-15: 2 g via INTRAVENOUS

## 2016-03-15 MED ORDER — FLEET ENEMA 7-19 GM/118ML RE ENEM: 1.0000 | ENEMA | Freq: Once | RECTAL | Status: DC | PRN

## 2016-03-15 MED ORDER — DEXAMETHASONE SODIUM PHOSPHATE 10 MG/ML IJ SOLN
INTRAMUSCULAR | Status: DC | PRN
  Administered 2016-03-15: 10 mg via INTRAVENOUS

## 2016-03-15 MED ORDER — CEFAZOLIN SODIUM-DEXTROSE 2-4 GM/100ML-% IV SOLN
2.0000 g | Freq: Three times a day (TID) | INTRAVENOUS | Status: AC
  Administered 2016-03-15 (×2): 2 g via INTRAVENOUS
  Filled 2016-03-15 (×2): qty 100

## 2016-03-15 MED ORDER — THROMBIN 20000 UNITS EX SOLR
CUTANEOUS | Status: DC | PRN
  Administered 2016-03-15: 08:00:00 via TOPICAL

## 2016-03-15 MED ORDER — LIDOCAINE HCL (CARDIAC) 20 MG/ML IV SOLN
INTRAVENOUS | Status: DC | PRN
  Administered 2016-03-15: 80 mg via INTRATRACHEAL

## 2016-03-15 MED ORDER — OXYCODONE-ACETAMINOPHEN 5-325 MG PO TABS
ORAL_TABLET | ORAL | Status: AC
  Filled 2016-03-15: qty 2

## 2016-03-15 MED ORDER — BISACODYL 10 MG RE SUPP: 10.0000 mg | Freq: Every day | RECTAL | Status: DC | PRN

## 2016-03-15 MED ORDER — FENTANYL CITRATE (PF) 100 MCG/2ML IJ SOLN
INTRAMUSCULAR | Status: AC
  Filled 2016-03-15: qty 4

## 2016-03-15 MED ORDER — PHENOL 1.4 % MT LIQD: 1.0000 | OROMUCOSAL | Status: DC | PRN

## 2016-03-15 MED ORDER — PROPOFOL 10 MG/ML IV BOLUS
INTRAVENOUS | Status: DC | PRN
  Administered 2016-03-15: 150 mg via INTRAVENOUS

## 2016-03-15 MED ORDER — DEXAMETHASONE SODIUM PHOSPHATE 4 MG/ML IJ SOLN: 2.0000 mg | Freq: Two times a day (BID) | INTRAMUSCULAR | Status: DC

## 2016-03-15 MED ORDER — HYDROMORPHONE HCL 1 MG/ML IJ SOLN
INTRAMUSCULAR | Status: AC
  Administered 2016-03-15: 0.5 mg via INTRAVENOUS
  Filled 2016-03-15: qty 0.5

## 2016-03-15 MED ORDER — ONDANSETRON HCL 4 MG/2ML IJ SOLN
4.0000 mg | INTRAMUSCULAR | Status: DC | PRN
  Administered 2016-03-15: 4 mg via INTRAVENOUS
  Filled 2016-03-15: qty 2

## 2016-03-15 MED FILL — Sodium Chloride IV Soln 0.9%: INTRAVENOUS | Qty: 1000 | Status: AC

## 2016-03-15 MED FILL — Heparin Sodium (Porcine) Inj 1000 Unit/ML: INTRAMUSCULAR | Qty: 30 | Status: AC

## 2016-03-15 SURGICAL SUPPLY — 60 items
BAG DECANTER FOR FLEXI CONT (MISCELLANEOUS) ×2 IMPLANT
BASKET BONE COLLECTION (BASKET) ×2 IMPLANT
BLADE CLIPPER SURG (BLADE) IMPLANT
BONE EQUIVA 10CC (Bone Implant) ×2 IMPLANT
BUR MATCHSTICK NEURO 3.0 LAGG (BURR) ×2 IMPLANT
CANISTER SUCT 3000ML PPV (MISCELLANEOUS) ×2 IMPLANT
CARTRIDGE OIL MAESTRO DRILL (MISCELLANEOUS) ×1 IMPLANT
CONT SPEC 4OZ CLIKSEAL STRL BL (MISCELLANEOUS) ×2 IMPLANT
COVER BACK TABLE 60X90IN (DRAPES) ×2 IMPLANT
DECANTER SPIKE VIAL GLASS SM (MISCELLANEOUS) ×2 IMPLANT
DERMABOND ADVANCED (GAUZE/BANDAGES/DRESSINGS) ×1
DERMABOND ADVANCED .7 DNX12 (GAUZE/BANDAGES/DRESSINGS) ×1 IMPLANT
DEVICE DISSECT PLASMABLAD 3.0S (MISCELLANEOUS) ×1 IMPLANT
DIFFUSER DRILL AIR PNEUMATIC (MISCELLANEOUS) ×2 IMPLANT
DRAPE C-ARM 42X72 X-RAY (DRAPES) ×4 IMPLANT
DRAPE HALF SHEET 40X57 (DRAPES) IMPLANT
DRAPE LAPAROTOMY 100X72X124 (DRAPES) ×2 IMPLANT
DRAPE POUCH INSTRU U-SHP 10X18 (DRAPES) ×2 IMPLANT
DRSG OPSITE POSTOP 4X6 (GAUZE/BANDAGES/DRESSINGS) ×2 IMPLANT
DURAPREP 26ML APPLICATOR (WOUND CARE) ×2 IMPLANT
DURASEAL APPLICATOR TIP (TIP) IMPLANT
DURASEAL SPINE SEALANT 3ML (MISCELLANEOUS) IMPLANT
ELECT REM PT RETURN 9FT ADLT (ELECTROSURGICAL) ×2
ELECTRODE REM PT RTRN 9FT ADLT (ELECTROSURGICAL) ×1 IMPLANT
GAUZE SPONGE 4X4 12PLY STRL (GAUZE/BANDAGES/DRESSINGS) ×2 IMPLANT
GAUZE SPONGE 4X4 16PLY XRAY LF (GAUZE/BANDAGES/DRESSINGS) IMPLANT
GLOVE BIOGEL PI IND STRL 8.5 (GLOVE) ×2 IMPLANT
GLOVE BIOGEL PI INDICATOR 8.5 (GLOVE) ×2
GLOVE ECLIPSE 8.5 STRL (GLOVE) ×4 IMPLANT
GOWN STRL REUS W/ TWL LRG LVL3 (GOWN DISPOSABLE) IMPLANT
GOWN STRL REUS W/ TWL XL LVL3 (GOWN DISPOSABLE) IMPLANT
GOWN STRL REUS W/TWL 2XL LVL3 (GOWN DISPOSABLE) ×4 IMPLANT
GOWN STRL REUS W/TWL LRG LVL3 (GOWN DISPOSABLE)
GOWN STRL REUS W/TWL XL LVL3 (GOWN DISPOSABLE)
HEMOSTAT POWDER KIT SURGIFOAM (HEMOSTASIS) IMPLANT
KIT BASIN OR (CUSTOM PROCEDURE TRAY) ×2 IMPLANT
KIT ROOM TURNOVER OR (KITS) ×2 IMPLANT
NEEDLE HYPO 22GX1.5 SAFETY (NEEDLE) ×2 IMPLANT
NS IRRIG 1000ML POUR BTL (IV SOLUTION) ×2 IMPLANT
OIL CARTRIDGE MAESTRO DRILL (MISCELLANEOUS) ×2
PACK LAMINECTOMY NEURO (CUSTOM PROCEDURE TRAY) ×2 IMPLANT
PAD ARMBOARD 7.5X6 YLW CONV (MISCELLANEOUS) ×6 IMPLANT
PATTIES SURGICAL .5 X1 (DISPOSABLE) ×2 IMPLANT
PLASMABLADE 3.0S (MISCELLANEOUS) ×2
ROD TI ALLOY CVD VIT 40MM (Rod) ×4 IMPLANT
SCREW VITALITY PA 6.5X45MM (Screw) ×8 IMPLANT
SPACER ZYSTON STRT 8X25X10WXX8 (Spacer) ×4 IMPLANT
SPONGE LAP 4X18 X RAY DECT (DISPOSABLE) IMPLANT
SPONGE SURGIFOAM ABS GEL 100 (HEMOSTASIS) ×2 IMPLANT
SUT PROLENE 6 0 BV (SUTURE) IMPLANT
SUT VIC AB 1 CT1 18XBRD ANBCTR (SUTURE) ×1 IMPLANT
SUT VIC AB 1 CT1 8-18 (SUTURE) ×1
SUT VIC AB 2-0 CP2 18 (SUTURE) ×2 IMPLANT
SUT VIC AB 3-0 SH 8-18 (SUTURE) ×2 IMPLANT
SYR 3ML LL SCALE MARK (SYRINGE) ×8 IMPLANT
TOP CLOSURE 5.5-6.0 SHEAR TOP (Neuro Prosthesis/Implant) ×8 IMPLANT
TOWEL OR 17X24 6PK STRL BLUE (TOWEL DISPOSABLE) ×2 IMPLANT
TOWEL OR 17X26 10 PK STRL BLUE (TOWEL DISPOSABLE) ×2 IMPLANT
TRAY FOLEY W/METER SILVER 16FR (SET/KITS/TRAYS/PACK) ×2 IMPLANT
WATER STERILE IRR 1000ML POUR (IV SOLUTION) ×2 IMPLANT

## 2016-03-15 NOTE — Anesthesia Procedure Notes (Signed)
Procedure Name: Intubation Date/Time: 03/15/2016 7:39 AM Performed by: Mariea Clonts Pre-anesthesia Checklist: Patient identified, Emergency Drugs available, Suction available and Patient being monitored Patient Re-evaluated:Patient Re-evaluated prior to inductionOxygen Delivery Method: Circle System Utilized Preoxygenation: Pre-oxygenation with 100% oxygen Intubation Type: IV induction Ventilation: Mask ventilation without difficulty Laryngoscope Size: Miller and 2 Grade View: Grade II Tube type: Oral Tube size: 7.5 mm Number of attempts: 1 Airway Equipment and Method: Stylet and Oral airway Placement Confirmation: ETT inserted through vocal cords under direct vision,  positive ETCO2 and breath sounds checked- equal and bilateral Tube secured with: Tape Dental Injury: Teeth and Oropharynx as per pre-operative assessment

## 2016-03-15 NOTE — Op Note (Signed)
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 Postoperative diagnosis: Same Procedure: L4-L5 decompressive laminectomy decompression of L4 and L5 nerve roots, posterior lumbar interbody arthrodesis with peek spacers local autograft and allograft, pedicle screw fixation L4-L5, posterior lateral arthrodesis L4-L5 Zimmer instrumentation and equipment the bone allograft Surgeon: [name] M.D.  Asst.: Newman Pies M.D.  Indications: Patient is Cheryl [name] is a 76 y.o. female who who's had significant back pain and lumbar radiculopathy for over a years period time. A lumbar MRI demonstrates advanced spondylosis with high-grade canal stenosis. she was advised regarding surgical intervention.  Procedure: The patient was brought to the operating room supine on a stretcher. After the smooth induction of general endotracheal anesthesia she was turned prone and the back was prepped with alcohol and DuraPrep. The back was then draped sterilely. A midline incision was created and carried down to the lumbar dorsal fascia. A localizing radiograph identified the L4 and L5 spinous processes. A subligamentous dissection was created at L4 and L5 to expose the interlaminar space at L4 and L5 and the facet joints over the L4-L5 interspace. Laminotomies were were then created removing the entire inferior margin of the lamina of L4 including the inferior facet at the L4-L5 joint. The yellow ligament was taken up and the common dural tube was exposed along with the L4 nerve root superiorly, and the L5 nerve root inferiorly, the disc space was exposed and epidural veins in this region were cauterized and divided. The L4 nerve roots and the L5 nerve root were dissected with care taken to protect them. The disc space was opened and a combination of curettes and rongeurs was used to evacuate the disc space  fully. The endplates were removed using sharp curettes. An interbody spacer was placed to distract the disc space while the contralateral discectomy was performed. When the entirety of the disc was removed and the endplates were prepared final sizing of the disc space was obtained 8 mm peek spacers with 8 of lordosis were chosen and packed with autograft and allograft and placed into the interspace. The remainder of the interspace was packed with autograft and allograft. The allograft was equivocal bone. Pedicle entry sites were then chosen using fluoroscopic guidance and 6.5 x 45 mm screws were placed in L4 and 6.5 x 45 mm screws were placed in L5. The lateral gutters were decorticated and graft was packed in the posterolateral gutters between L4 and L5. Final radiographs were obtained after placing appropriately sized rods between the pedicle screws at L4-L5 and torquing these to the appropriate tension. The surgical site was inspected carefully to assure the L4 and L5 nerve roots were well decompressed, hemostasis was obtained, and the graft was well packed. Then the retractors were removed and the wound was closed with #1 Vicryl in the lumbar dorsal fascia 2-0 Vicryl in the subcutaneous tissue and 3-0 Vicryl subcuticularly. When he cc of half percent Marcaine was injected into the paraspinous musculature at the time of closure. Blood loss was estimated at 150 cc. The patient tolerated procedure well and was returned to the recovery room in stable condition.

## 2016-03-15 NOTE — Anesthesia Postprocedure Evaluation (Signed)
Anesthesia Post Note  Patient: Cheryl [name]  Procedure(s) Performed: Procedure(s) (LRB): Lumbar four - Lumbar five Posterior lumbar interbody fusion (N/A)  Patient location during evaluation: PACU Anesthesia Type: General Level of consciousness: awake and alert Pain management: pain level controlled Vital Signs Assessment: post-procedure vital signs reviewed and stable Respiratory status: spontaneous breathing, nonlabored ventilation, respiratory function stable and patient connected to nasal cannula oxygen Cardiovascular status: blood pressure returned to baseline and stable Postop Assessment: no signs of nausea or vomiting Anesthetic complications: no       Last Vitals:  Vitals:   03/15/16 1141 03/15/16 1219  BP: 120/68 126/79  Pulse: (!) 108 (!) 108  Resp: 19 18  Temp:  36.8 C    Last Pain:  Vitals:   03/15/16 1318  TempSrc:   PainSc: 7                  Tiajuana Amass

## 2016-03-15 NOTE — Evaluation (Signed)
Physical Therapy Evaluation Patient Details Name: Cheryl [name] MRN: [id number] DOB: 07-23-1940 Today's Date: 03/15/2016   History of Present Illness  Patient is a 76 y/o female with hx of uterine ca, HLD, depression, anxiety presents s/p L4-5 PLIF.  Clinical Impression  Patient presents with nausea and post surgical deficits s/p above surgery. Tolerated gait training and transfers with Min guard assist. Pt with + emesis during session. RN notified. Education re: back precautions, positioning, log roll technique etc. Pt will have support from daughter at d/c. Will follow acutely to maximize independence and mobility prior to return home.    Follow Up Recommendations Home health PT;Supervision - Intermittent    Equipment Recommendations  None recommended by PT    Recommendations for Other Services       Precautions / Restrictions Precautions Precautions: Back Precaution Booklet Issued: Yes (comment) Precaution Comments: Reviewed back precautions Required Braces or Orthoses: Spinal Brace Spinal Brace: Lumbar corset;Applied in sitting position Restrictions Weight Bearing Restrictions: No      Mobility  Bed Mobility               General bed mobility comments: Up in chair with RN upon PT arrival.  Transfers Overall transfer level: Needs assistance Equipment used: Rolling walker (2 wheeled) Transfers: Sit to/from Stand Sit to Stand: Min guard         General transfer comment: Min guard for safety. Stood from [employer], transferred to chair.   Ambulation/Gait Ambulation/Gait assistance: Min guard Ambulation Distance (Feet): 50 Feet Assistive device: Rolling walker (2 wheeled) Gait Pattern/deviations: Step-through pattern;Decreased stride length;Trunk flexed Gait velocity: decreased   General Gait Details: Slow, mildly unsteady gait with cues for RW management.  + nausea.  Stairs            Wheelchair Mobility    Modified Rankin (Stroke Patients Only)       Balance Overall balance assessment: Needs assistance Sitting-balance support: Feet supported;No upper extremity supported Sitting balance-Leahy Scale: Good     Standing balance support: During functional activity;Bilateral upper extremity supported Standing balance-Leahy Scale: Poor Standing balance comment: Reliant on BUes for support in standing.                             Pertinent Vitals/Pain Pain Assessment: No/denies pain    Home Living Family/patient expects to be discharged to:: Private residence Living Arrangements: Alone Available Help at Discharge: Family;Available PRN/intermittently Type of Home: House Home Access: Level entry     Home Layout: One level Home Equipment: Walker - 2 wheels Additional Comments: has access to [facility] if needed.    Prior Function Level of Independence: Independent         Comments: minimal cooking/cleaning, drives.      Hand Dominance        Extremity/Trunk Assessment   Upper Extremity Assessment Upper Extremity Assessment: Defer to OT evaluation    Lower Extremity Assessment Lower Extremity Assessment: Generalized weakness (No numbness/tingling BLEs.)    Cervical / Trunk Assessment Cervical / Trunk Assessment: Other exceptions Cervical / Trunk Exceptions: s/p spine surgery  Communication   Communication: No difficulties  Cognition Arousal/Alertness: Awake/alert Behavior During Therapy: WFL for tasks assessed/performed Overall Cognitive Status: Within Functional Limits for tasks assessed                      General Comments General comments (skin integrity, edema, etc.): + emesis post transfer to chair. RN notified.  Exercises     Assessment/Plan    PT Assessment Patient needs continued PT services  PT Problem List Decreased strength;Decreased mobility;Decreased balance;Decreased activity tolerance;Decreased knowledge of precautions;Decreased knowledge of use of DME          PT  Treatment Interventions DME instruction;Therapeutic activities;Gait training;Therapeutic exercise;Patient/family education;Balance training;Functional mobility training    PT Goals (Current goals can be found in the Care Plan section)  Acute Rehab PT Goals Patient Stated Goal: to feel better and get home to her dog boo PT Goal Formulation: With patient Time For Goal Achievement: 03/29/16 Potential to Achieve Goals: Good    Frequency Min 5X/week   Barriers to discharge Decreased caregiver support lives alone but daughter will be able to assist    Co-evaluation               End of Session Equipment Utilized During Treatment: Gait belt;Back brace Activity Tolerance: Other (comment) (limited by nausea.) Patient left: in chair;with call bell/phone within reach;with nursing/sitter in room Nurse Communication: Mobility status         Time: [id number] PT Time Calculation (min) (ACUTE ONLY): 24 min   Charges:   PT Evaluation $PT Eval Low Complexity: 1 Procedure PT Treatments $Gait Training: 8-22 mins   PT G Codes:        Mathhew Buysse A Xandria Gallaga 03/15/2016, 4:43 PM Wray Kearns, [place], DPT [phone]

## 2016-03-15 NOTE — Transfer of Care (Signed)
Immediate Anesthesia Transfer of Care Note  Patient: Cheryl [name]  Procedure(s) Performed: Procedure(s) with comments: Lumbar four - Lumbar five Posterior lumbar interbody fusion (N/A) - Lumbar four - Lumbar five Posterior lumbar interbody fusion  Patient Location: PACU  Anesthesia Type:General  Level of Consciousness: awake, alert  and oriented  Airway & Oxygen Therapy: Patient Spontanous Breathing and Patient connected to nasal cannula oxygen  Post-op Assessment: Report given to RN, Post -op Vital signs reviewed and stable and Patient moving all extremities X 4  Post vital signs: Reviewed and stable  Last Vitals:  Vitals:   03/15/16 1054 03/15/16 1055  BP:  138/68  Pulse:  (!) 105  Resp:  (!) 21  Temp: 36.7 C     Last Pain:  Vitals:   03/15/16 1054  TempSrc:   PainSc: 0-No pain         Complications: No apparent anesthesia complications

## 2016-03-15 NOTE — Progress Notes (Signed)
Patient ID: Cheryl [name], female   DOB: [birth year], 76 y.o.   MRN: [id number] Vital signs are stable Motor function appears intact Patient is having significant nausea and vomiting this afternoon Hopefully tomorrow be a better day

## 2016-03-15 NOTE — H&P (Signed)
Cheryl [name] is an 76 y.o. female.   Chief Complaint: Back and bilateral leg pain HPI: Patient is a 76 year old individual who's had significant spondylolisthesis at the level of L4-L5 with severe stenosis at this level. She is failed efforts at conservative management over the years and she's been advised regarding surgical decompression at L4-L5 with posterior interbody arthrodesis and stabilization of that joint. She is now admitted for this procedure.  Past Medical History:  Diagnosis Date  . Anemia   . Anxiety    d/t recent family issues  . Arthritis   . Bone spur   . Chronic back pain    stenosis and OA  . Depression    takes Paxil daily  . Diverticulosis   . Dizzy    occasionally  . Fatty liver    has had 2 Hep shots and the final one is in June 18  . GERD (gastroesophageal reflux disease)    takes Omeprazole daily  . Hepatitis 2012  . History of bronchitis    20-25 yrs ago  . History of colon polyps    precancerous  . History of shingles   . Hyperlipidemia    takes Simvastatin daily  . IBS (irritable bowel syndrome)    more on side of constipation-takes Miralax daily  . Insomnia    takes Melatonin and Ambien nightly  . Internal hemorrhoids   . Joint pain   . Joint swelling   . Lung nodules    calcified   . Migraine headache    migraines-thinks coming from neck.Last one1/28/18  . Pneumonia    hx of-20 yrs ago-=-walking  . Renal cyst    left  . Uterine cancer ([place]) 1973    Past Surgical History:  Procedure Laterality Date  . APPENDECTOMY  1966  . cataracts     bilateral  . COLONOSCOPY    . EUS  02/10/2011   Procedure: UPPER ENDOSCOPIC ULTRASOUND (EUS) RADIAL;  Surgeon: Owens Loffler, MD;  Location: [facility] ENDOSCOPY;  Service: Endoscopy;  Laterality: N/A;  radial linear   . RECTOCELE REPAIR  2011  . ROTATOR CUFF REPAIR  2009   left  . TONSILLECTOMY    . UPPER GASTROINTESTINAL ENDOSCOPY    . VAGINAL HYSTERECTOMY  1973    Family History  Problem Relation  Age of Onset  . Alcohol abuse Father   . Lung cancer Father     smoker  . Kidney failure Mother     med induced  . Rheum arthritis Mother   . Depression Brother   . Heart disease      Paternal family   . Heart failure Daughter   . Lung cancer Son   . Crohn's disease Brother   . Alcohol abuse Brother   . HIV Son   . Malignant hyperthermia Neg Hx   . Colon cancer Neg Hx   . Esophageal cancer Neg Hx   . Rectal cancer Neg Hx   . Stomach cancer Neg Hx    Social History:  reports that she quit smoking about 23 years ago. She has never used smokeless tobacco. She reports that she does not drink alcohol or use drugs.  Allergies:  Allergies  Allergen Reactions  . Fluoxetine Hcl Shortness Of Breath and Itching    REACTION: itching,SOB  . Contrast Media [Iodinated Diagnostic Agents] Itching    Multilance.   . Codeine Sulfate Itching    REACTION: itching  . Sulfonamide Derivatives Nausea Only    REACTION: Nausea  Facility-Administered Medications Prior to Admission  Medication Dose Route Frequency Provider Last Rate Last Dose  . 0.9 %  sodium chloride infusion  500 mL Intravenous Continuous Irene Shipper, MD       Medications Prior to Admission  Medication Sig Dispense Refill  . ALPRAZolam (XANAX) 0.5 MG tablet Take 1 tablet (0.5 mg total) by mouth at bedtime as needed for anxiety. 15 tablet 1  . aspirin 81 MG tablet Take 81 mg by mouth daily.      . Cholecalciferol (VITAMIN D) 1000 UNITS capsule Take 1,000 Units by mouth daily.      . fluticasone (FLONASE) 50 MCG/ACT nasal spray USE 2 SPRAYS IN EACH NOSTRIL EVERY DAY 48 g 3  . MAGNESIUM PO Take 1 tablet by mouth daily.     . Melatonin 5 MG CAPS Take 5 mg by mouth at bedtime as needed. TAKE 1 CAPSULE AT BEDTIME     . MILK THISTLE EXTRACT PO Take 1 capsule by mouth daily.     Marland Kitchen omeprazole (PRILOSEC) 20 MG capsule TAKE 1 CAPSULE TWICE DAILY BEFORE A MEAL 180 capsule 3  . PARoxetine (PAXIL) 20 MG tablet TAKE 1 TABLET EVERY MORNING  90 tablet 3  . polyethylene glycol (MIRALAX / GLYCOLAX) packet Take 17 g by mouth daily.    Marland Kitchen PREMARIN vaginal cream Place 1 Applicatorful vaginally 2 (two) times a week.   11  . RESTASIS 0.05 % ophthalmic emulsion Place 1 drop into both eyes 2 (two) times daily.     . simvastatin (ZOCOR) 40 MG tablet TAKE 1 TABLET AT BEDTIME 90 tablet 1  . traMADol (ULTRAM) 50 MG tablet Take 1 tablet (50 mg total) by mouth 2 (two) times daily as needed. 180 tablet 1  . zolpidem (AMBIEN) 5 MG tablet Take 1 tablet (5 mg total) by mouth at bedtime as needed for sleep. 30 tablet 1  . ipratropium (ATROVENT) 0.03 % nasal spray Place 2 sprays into both nostrils every 12 (twelve) hours. (Patient not taking: Reported on 03/01/2016) 30 mL 3    No results found for this or any previous visit (from the past 48 hour(s)). No results found.  Review of Systems  HENT: Negative.   Eyes: Negative.   Respiratory: Negative.   Cardiovascular: Negative.   Gastrointestinal: Negative.   Genitourinary: Negative.   Musculoskeletal: Positive for back pain and neck pain.  Skin: Negative.   Neurological: Positive for sensory change and weakness.  Endo/Heme/Allergies: Negative.   Psychiatric/Behavioral: Negative.     Blood pressure (!) 129/54, pulse 71, temperature 97.9 F (36.6 C), temperature source Oral, resp. rate 20, weight 86.2 kg (190 lb), SpO2 97 %. Physical Exam  Constitutional: She is oriented to person, place, and time. She appears well-developed and well-nourished.  HENT:  Head: Normocephalic and atraumatic.  Eyes: Conjunctivae and EOM are normal. Pupils are equal, round, and reactive to light.  Neck: Normal range of motion. Neck supple.  Cardiovascular: Normal rate and regular rhythm.   Respiratory: Effort normal and breath sounds normal.  GI: Soft. Bowel sounds are normal.  Musculoskeletal:  Moderate tenderness in lower lumbar spine. Straight leg raising positive at 30 bilaterally. Patrick's maneuver is  negative.  Neurological: She is alert and oriented to person, place, and time.  Normal strength tone and bulk in major groups of the upper extremities and lower extremity slight weakness in tibialis anterior bilaterally of 4 out of 5. Absent reflexes in both Achilles. Patellar reflexes are 1+. Cranial nerve examination is  normal. Patient and gait are normal safer difficulty with heel walking  Skin: Skin is warm and dry.  Psychiatric: She has a normal mood and affect. Her behavior is normal. Judgment and thought content normal.     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 Posterolateral arthrodesis using local autograft and allograft.  Earleen Newport, MD 03/15/2016, 7:26 AM

## 2016-03-16 MED ORDER — DIPHENHYDRAMINE HCL 25 MG PO CAPS
25.0000 mg | ORAL_CAPSULE | ORAL | Status: DC | PRN
  Administered 2016-03-16 (×2): 25 mg via ORAL
  Filled 2016-03-16 (×2): qty 1

## 2016-03-16 MED ORDER — HYDROMORPHONE HCL 2 MG PO TABS
1.0000 mg | ORAL_TABLET | ORAL | Status: DC | PRN
  Administered 2016-03-16: 2 mg via ORAL
  Filled 2016-03-16: qty 1

## 2016-03-16 NOTE — Progress Notes (Signed)
Physical Therapy Treatment Patient Details Name: Cheryl [name] MRN: [id number] DOB: [birth year] Today's Date: 03/16/2016    History of Present Illness Patient is a 76 y/o female with hx of uterine ca, HLD, depression, anxiety presents s/p L4-5 PLIF.    PT Comments    Pt progressing toward goals. Pt ambulated further with decreased assistance for mobility. Reviewed bed mobility upon pt request with appropriate log roll technique. Pt would still benefit from d/c home with intermittent supervision, but may no longer need home health if continues to improve. PT will continue to follow and reassess d/c recommendation.    Follow Up Recommendations  Home health PT;Supervision - Intermittent (may no longer need home health if continues to improve)     Equipment Recommendations  None recommended by PT    Recommendations for Other Services       Precautions / Restrictions Precautions Precautions: Back;Fall Precaution Comments: reviewed precautions Required Braces or Orthoses: Spinal Brace Spinal Brace: Lumbar corset;Other (comment) (already donning brace on PT arrival) Restrictions Weight Bearing Restrictions: No Other Position/Activity Restrictions: reviewed spine precautions    Mobility  Bed Mobility Overal bed mobility: Needs Assistance Bed Mobility: Sidelying to Sit;Sit to Sidelying   Sidelying to sit: Supervision     Sit to sidelying: Supervision General bed mobility comments: pt able to complete with bed level and no bed rails  Transfers Overall transfer level: Needs assistance Equipment used: Rolling walker (2 wheeled) Transfers: Sit to/from Stand Sit to Stand: Min guard         General transfer comment: min guard for safety, v/c to push from chair; pt relies heavily upon back for sit to stand due to LE weakness  Ambulation/Gait Ambulation/Gait assistance: Min guard Ambulation Distance (Feet): 400 Feet Assistive device: Rolling walker (2 wheeled) (RW for 22ft, no  AD for 283ft) Gait Pattern/deviations: Step-through pattern;Decreased stride length;Trunk flexed;Wide base of support Gait velocity: decreased Gait velocity interpretation: Below normal speed for age/gender General Gait Details: v/c upright posture and use of core muscles, decreased speed without use of AD and slightly unstable   Stairs            Wheelchair Mobility    Modified Rankin (Stroke Patients Only)       Balance Overall balance assessment: Needs assistance Sitting-balance support: Feet supported;No upper extremity supported Sitting balance-Leahy Scale: Good     Standing balance support: No upper extremity supported Standing balance-Leahy Scale: Good Standing balance comment: able to ambulate and stand without UE support but slightly unsteady                    Cognition Arousal/Alertness: Awake/alert Behavior During Therapy: Anxious Overall Cognitive Status: Within Functional Limits for tasks assessed                 General Comments: anxious regarding bed mobility    Exercises      General Comments General comments (skin integrity, edema, etc.): discussed care for dog and lifting restrictions      Pertinent Vitals/Pain Pain Assessment: 0-10 Pain Score: 3  Pain Location: lumbar spine Pain Descriptors / Indicators: Discomfort Pain Intervention(s): Limited activity within patient's tolerance;Monitored during session    Home Living                      Prior Function            PT Goals (current goals can now be found in the care plan section) Progress towards PT goals:  Progressing toward goals    Frequency    Min 5X/week      PT Plan Current plan remains appropriate    Co-evaluation             End of Session Equipment Utilized During Treatment: Gait belt;Back brace Activity Tolerance: Patient tolerated treatment well Patient left: in chair;with call bell/phone within reach     Time: 0858-0916 PT  Time Calculation (min) (ACUTE ONLY): 18 min  Charges:  $Gait Training: 8-22 mins                    G Codes:      Tracie Harrier 2016-04-14, 9:52 AM  Tracie Harrier, SPT Acute Rehab SPT [phone]

## 2016-03-16 NOTE — Evaluation (Signed)
Occupational Therapy Evaluation Patient Details Name: Cheryl [name] MRN: [id number] DOB: May 22, 1940 Today's Date: 03/16/2016    History of Present Illness Patient is a 76 y/o female with hx of uterine ca, HLD, depression, anxiety presents s/p L4-5 PLIF.   Clinical Impression   Pt reports she was independent with ADL PTA. Currently pt overall min guard for ADL and functional mobility with the exception of min assist for LB ADL. Began back, safety, and ADL education with pt. Pt planning to d/c home alone with intermittent supervision from family. Pt would benefit from continued skilled OT to address established goals.    Follow Up Recommendations  No OT follow up;Supervision - Intermittent    Equipment Recommendations  None recommended by OT    Recommendations for Other Services       Precautions / Restrictions Precautions Precautions: Back;Fall Precaution Booklet Issued: No (already provided by PT) Precaution Comments: reviewed precautions Required Braces or Orthoses: Spinal Brace Spinal Brace: Lumbar corset;Applied in sitting position Restrictions Weight Bearing Restrictions: No Other Position/Activity Restrictions: reviewed spine precautions      Mobility Bed Mobility Overal bed mobility: Needs Assistance Bed Mobility: Sidelying to Sit;Sit to Sidelying   Sidelying to sit: Supervision     Sit to sidelying: Supervision General bed mobility comments: Pt OOB in chair upon arrival  Transfers Overall transfer level: Needs assistance Equipment used: None Transfers: Sit to/from Stand Sit to Stand: Min guard         General transfer comment: Min guard for safety with increased time. Good hand placement and technique    Balance Overall balance assessment: Needs assistance Sitting-balance support: No upper extremity supported;Feet supported Sitting balance-Leahy Scale: Good     Standing balance support: No upper extremity supported;During functional  activity Standing balance-Leahy Scale: Good Standing balance comment: able to ambulate and stand without UE support but slightly unsteady                            ADL Overall ADL's : Needs assistance/impaired Eating/Feeding: Set up;Sitting   Grooming: Supervision/safety;Standing Grooming Details (indicate cue type and reason): Educated on use of 2 cups for oral care Upper Body Bathing: Supervision/ safety;Sitting   Lower Body Bathing: Minimal assistance;Sit to/from stand   Upper Body Dressing : Supervision/safety;Sitting Upper Body Dressing Details (indicate cue type and reason): reports no difficulty with donning brace Lower Body Dressing: Minimal assistance;Sit to/from stand Lower Body Dressing Details (indicate cue type and reason): unable to cross foot over opposite knee; report family can assist as needed. May benefit from AE-plan to trial next session Toilet Transfer: Min guard;Ambulation;BSC Toilet Transfer Details (indicate cue type and reason): Simulated by sit to stand from chair with functional mobility in room Toileting- Clothing Manipulation and Hygiene: Supervision/safety;Sit to/from stand Toileting - Clothing Manipulation Details (indicate cue type and reason): Educated on proper peri care technique without twisting and use of wet wipes Tub/ Shower Transfer: Min guard;Tub transfer;Ambulation;Shower Scientist, research (medical) Details (indicate cue type and reason): Simulated tub transfer in room with supervision. Recommend supervision upon return home Functional mobility during ADLs: Min guard General ADL Comments: Educated on maintaining back precuations during functional activities, keeping frequently used items at counter top height, bed mobility, frequent mobility throughout the day upon return home.     Vision     Perception     Praxis      Pertinent Vitals/Pain Pain Assessment: 0-10 Pain Score: 5  Pain Location: back with  sit to stand Pain  Descriptors / Indicators: Discomfort;Grimacing Pain Intervention(s): Monitored during session     Hand Dominance     Extremity/Trunk Assessment Upper Extremity Assessment Upper Extremity Assessment: Overall WFL for tasks assessed   Lower Extremity Assessment Lower Extremity Assessment: Defer to PT evaluation   Cervical / Trunk Assessment Cervical / Trunk Assessment: Other exceptions Cervical / Trunk Exceptions: s/p spine surgery   Communication Communication Communication: No difficulties   Cognition Arousal/Alertness: Awake/alert Behavior During Therapy: WFL for tasks assessed/performed Overall Cognitive Status: Within Functional Limits for tasks assessed                 General Comments: anxious regarding bed mobility   General Comments       Exercises       Shoulder Instructions      Home Living Family/patient expects to be discharged to:: Private residence Living Arrangements: Alone Available Help at Discharge: Family;Available PRN/intermittently Type of Home: House Home Access: Level entry     Home Layout: One level     Bathroom Shower/Tub: Tub/shower unit Shower/tub characteristics: Curtain Biochemist, clinical: Standard     Home Equipment: Environmental consultant - 2 wheels;Bedside commode;Shower seat          Prior Functioning/Environment Level of Independence: Independent                 OT Problem List: Decreased strength;Decreased range of motion;Decreased activity tolerance;Impaired balance (sitting and/or standing);Decreased safety awareness;Decreased knowledge of use of DME or AE;Decreased knowledge of precautions;Pain   OT Treatment/Interventions: Self-care/ADL training;Energy conservation;DME and/or AE instruction;Therapeutic activities;Patient/family education;Balance training    OT Goals(Current goals can be found in the care plan section) Acute Rehab OT Goals Patient Stated Goal: to feel better and get home to her dog boo OT Goal Formulation:  With patient Time For Goal Achievement: 03/30/16 Potential to Achieve Goals: Good ADL Goals Pt Will Perform Lower Body Bathing: with supervision;with adaptive equipment;sit to/from stand Pt Will Perform Lower Body Dressing: with supervision;with adaptive equipment;sit to/from stand Additional ADL Goal #1: Pt will independently verbally recall 3/3 back precautions as precursor to ADL/functional mobility. Additional ADL Goal #2: Pt will don/doff back brace with set up as precursor to ADL and functional mobility. Additional ADL Goal #3: Pt will perform bed mobility at supervision level.  OT Frequency: Min 2X/week   Barriers to D/C: Decreased caregiver support  lives alone       Co-evaluation              End of Session Equipment Utilized During Treatment: Back brace Nurse Communication: Mobility status;Other (comment) (no equipment of f/u needs)  Activity Tolerance: Patient tolerated treatment well Patient left: in chair;with call bell/phone within reach   Time: [id number] OT Time Calculation (min): 16 min Charges:  OT General Charges $OT Visit: 1 Procedure OT Evaluation $OT Eval Moderate Complexity: 1 Procedure G-Codes:     Binnie Kand M.S., OTR/L Pager: [phone]  03/16/2016, 10:17 AM

## 2016-03-16 NOTE — Progress Notes (Signed)
Patient ID: Cheryl [name], female   DOB: [birth year], 76 y.o.   MRN: [id number] Vital signs are stable Motor function appears intact Nausea has largely subsided Patient will ambulate today and mobilize Plan discharge in a.m. Patient notes some itching and allergic reaction to Percocet We'll try either Benadryl were changed to Dilaudid.

## 2016-03-17 MED ORDER — OXYCODONE-ACETAMINOPHEN 5-325 MG PO TABS: 1.0000 | ORAL_TABLET | ORAL | 0 refills | Status: DC | PRN

## 2016-03-17 MED ORDER — DEXAMETHASONE 1 MG PO TABS: ORAL_TABLET | ORAL | 0 refills | Status: DC

## 2016-03-17 MED ORDER — DIAZEPAM 5 MG PO TABS: 5.0000 mg | ORAL_TABLET | Freq: Four times a day (QID) | ORAL | 0 refills | Status: DC | PRN

## 2016-03-17 NOTE — Progress Notes (Signed)
Occupational Therapy Treatment Patient Details Name: Cheryl [name] MRN: [id number] DOB: [birth year] Today's Date: 03/17/2016    History of present illness Patient is a 76 y/o female with hx of uterine ca, HLD, depression, anxiety presents s/p L4-5 PLIF.   OT comments  Pt able to perform functional mobility today with supervision. Pt reports she is not interested in AE; she was able to get her pants started over her feet this AM on her own. Reviewed bed mobility technique; pt able to perform with HOB flat and without use of bed rails but did require verbal cues for sequencing and log roll technique. D/c plan remains appropriate. Will continue to follow acutely.   Follow Up Recommendations  No OT follow up;Supervision - Intermittent    Equipment Recommendations  None recommended by OT    Recommendations for Other Services      Precautions / Restrictions Precautions Precautions: Back;Fall Precaution Comments: Pt able to recall 3/3 precautions Required Braces or Orthoses: Spinal Brace Spinal Brace: Lumbar corset;Applied in sitting position Restrictions Weight Bearing Restrictions: No       Mobility Bed Mobility Overal bed mobility: Needs Assistance Bed Mobility: Rolling;Sidelying to Sit;Sit to Sidelying Rolling: Supervision Sidelying to sit: Supervision     Sit to sidelying: Supervision General bed mobility comments: Cues for technique. HOB flat without use of bed rail  Transfers Overall transfer level: Needs assistance Equipment used: None Transfers: Sit to/from Stand Sit to Stand: Supervision         General transfer comment: good hand placement, encouraged scooting toward edge prior to standing.     Balance Overall balance assessment: Needs assistance Sitting-balance support: Feet supported;No upper extremity supported Sitting balance-Leahy Scale: Good     Standing balance support: No upper extremity supported;During functional activity Standing balance-Leahy  Scale: Good                     ADL Overall ADL's : Needs assistance/impaired                 Upper Body Dressing : Set up;Sitting Upper Body Dressing Details (indicate cue type and reason): to doff/don brace   Lower Body Dressing Details (indicate cue type and reason): Pt reports she feels she does not need AE; able to don pants this AM without bending and plans to wear house shoes/slippers at home Toilet Transfer: Supervision/safety;Ambulation Toilet Transfer Details (indicate cue type and reason): Simulated by sit to stand from chair with functional mobility         Functional mobility during ADLs: Supervision/safety General ADL Comments: Reviewed maintaining back precautions during functional activities, bed mobility technique, and frequent mobility throughout the day upon return home      Vision                     Perception     Praxis      Cognition   Behavior During Therapy: [facility] for tasks assessed/performed Overall Cognitive Status: Within Functional Limits for tasks assessed                       Extremity/Trunk Assessment               Exercises     Shoulder Instructions       General Comments      Pertinent Vitals/ Pain       Pain Assessment: 0-10 Pain Score: 6  Pain Location: back Pain Descriptors / Indicators: Aching;Sore Pain  Intervention(s): Monitored during session;Premedicated before session;Repositioned  Home Living                                          Prior Functioning/Environment              Frequency  Min 2X/week        Progress Toward Goals  OT Goals(current goals can now be found in the care plan section)  Progress towards OT goals: Progressing toward goals  Acute Rehab OT Goals Patient Stated Goal: get back to her dog OT Goal Formulation: With patient  Plan Discharge plan remains appropriate    Co-evaluation                 End of Session Equipment  Utilized During Treatment: Back brace   Activity Tolerance Patient tolerated treatment well   Patient Left in chair;with call bell/phone within reach   Nurse Communication Mobility status        Time: [id number] OT Time Calculation (min): 16 min  Charges: OT General Charges $OT Visit: 1 Procedure OT Treatments $Self Care/Home Management : 8-22 mins  Binnie Kand M.S., OTR/L Pager: [phone]  03/17/2016, 10:16 AM

## 2016-03-17 NOTE — Discharge Summary (Signed)
Physician Discharge Summary  Patient ID: Cheryl [name] MRN: [id number] DOB/AGE: 74-Jun-1942 74 y.o.  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 She is discharged home  Consults: None  Significant Diagnostic Studies: None  Treatments: surgery: Decompression of L4-L5 with posterior lumbar interbody arthrodesis. Pedicle screw fixation L4-L5.  Discharge Exam: Blood pressure (!) 100/46, pulse 86, temperature 99.3 F (37.4 C), temperature source Oral, resp. rate 18, weight 86.2 kg (190 lb), SpO2 95 %. Incision is clean and dry. Station and gait are intact.  Disposition: 01-Home or Self Care  Discharge Instructions    Call MD for:  redness, tenderness, or signs of infection (pain, swelling, redness, odor or green/yellow discharge around incision site)    Complete by:  As directed    Call MD for:  severe uncontrolled pain    Complete by:  As directed    Call MD for:  temperature >100.4    Complete by:  As directed    Diet - low sodium heart healthy    Complete by:  As directed    Discharge instructions    Complete by:  As directed    Okay to shower. Do not apply salves or appointments to incision. No heavy lifting with the upper extremities greater than 15 pounds. May resume driving when not requiring pain medication and patient feels comfortable with doing so.   Incentive spirometry RT    Complete by:  As directed    Increase activity slowly    Complete by:  As directed      Allergies as of 03/17/2016      Reactions   Fluoxetine Hcl Shortness Of Breath, Itching   REACTION: itching,SOB   Contrast Media  [iodinated Diagnostic Agents] Itching   Multilance.    Codeine Sulfate Itching   REACTION: itching   Sulfonamide Derivatives Nausea Only   REACTION: Nausea      Medication List    TAKE these medications   ALPRAZolam 0.5 MG tablet Commonly known as:  XANAX Take 1 tablet (0.5 mg total) by mouth at bedtime as needed for anxiety.   aspirin 81 MG tablet Take 81 mg by mouth daily.   dexamethasone 1 MG tablet Commonly known as:  DECADRON 2 tablets twice daily for 2 days, one tablet twice daily for 2 days, one tablet daily for 2 days.   diazepam 5 MG tablet Commonly known as:  VALIUM Take 1 tablet (5 mg total) by mouth every 6 (six) hours as needed for muscle spasms.   fluticasone 50 MCG/ACT nasal spray Commonly known as:  FLONASE USE 2 SPRAYS IN EACH NOSTRIL EVERY DAY   ipratropium 0.03 % nasal spray Commonly known as:  ATROVENT Place 2 sprays into both nostrils every 12 (twelve) hours.   MAGNESIUM PO Take 1 tablet by mouth daily.   Melatonin 5 MG Caps Take 5 mg by mouth at bedtime as needed. TAKE 1 CAPSULE AT BEDTIME   MILK THISTLE EXTRACT PO Take 1 capsule by mouth daily.   omeprazole 20 MG capsule Commonly known as:  PRILOSEC TAKE 1 CAPSULE TWICE DAILY BEFORE A MEAL   oxyCODONE-acetaminophen 5-325 MG tablet Commonly known as:  PERCOCET/ROXICET Take 1-2 tablets by mouth every 4 (four)  hours as needed for moderate pain.   PARoxetine 20 MG tablet Commonly known as:  PAXIL TAKE 1 TABLET EVERY MORNING   polyethylene glycol packet Commonly known as:  MIRALAX / GLYCOLAX Take 17 g by mouth daily.   PREMARIN vaginal cream Generic drug:  conjugated estrogens Place 1 Applicatorful vaginally 2 (two) times a week.   RESTASIS 0.05 % ophthalmic emulsion Generic drug:  cycloSPORINE Place 1 drop into both eyes 2 (two) times daily.   simvastatin 40 MG tablet Commonly known as:  ZOCOR TAKE 1 TABLET AT BEDTIME   traMADol 50 MG tablet Commonly known as:  ULTRAM Take 1  tablet (50 mg total) by mouth 2 (two) times daily as needed.   Vitamin D 1000 units capsule Take 1,000 Units by mouth daily.   zolpidem 5 MG tablet Commonly known as:  AMBIEN Take 1 tablet (5 mg total) by mouth at bedtime as needed for sleep.        SignedEarleen Newport 03/17/2016, 9:11 AM

## 2016-03-17 NOTE — Progress Notes (Signed)
Pt given D/C instructions with Rx's, verbal understanding was provided. Pt's dressing was removed per MD order. Pt's incision is clean and dry. Pt's IV was removed prior to D/C. Pt D/C'd home via wheelchair with son @ 1050 per MD order. Pt is stable @ D/C and has no other needs at this time. Holli Humbles, RN

## 2016-03-17 NOTE — Progress Notes (Signed)
Physical Therapy Treatment Patient Details Name: Cheryl [name] MRN: [id number] DOB: 08-21-1940 Today's Date: 03/17/2016    History of Present Illness Patient is a 76 y/o female with hx of uterine ca, HLD, depression, anxiety presents s/p L4-5 PLIF.    PT Comments    Pt mobilizing well and denying any questions or concerns regarding going home. Pt states that she will have a friend who can help her if needed going up her stairs. Pt denies any questions or concerns at this time. Recommending HHPT services to follow at home.   Follow Up Recommendations  Home health PT;Supervision - Intermittent     Equipment Recommendations  None recommended by PT    Recommendations for Other Services       Precautions / Restrictions Precautions Precautions: Back;Fall Precaution Comments: reviewed precautions Required Braces or Orthoses: Spinal Brace Spinal Brace: Lumbar corset;Applied in sitting position Restrictions Weight Bearing Restrictions: No    Mobility  Bed Mobility               General bed mobility comments: pt sitting in chair upon arrival, declines attempting bed mobility to review. Verbally reviewed logroll technique  Transfers Overall transfer level: Needs assistance Equipment used: None Transfers: Sit to/from Stand Sit to Stand: Modified independent (Device/Increase time)         General transfer comment: good hand placement, encouraged scooting toward edge prior to standing.   Ambulation/Gait Ambulation/Gait assistance: Min guard Ambulation Distance (Feet): 300 Feet Assistive device: None Gait Pattern/deviations: Step-through pattern Gait velocity: decreased   General Gait Details: good stability, no loss of balance   Stairs Stairs: Yes   Stair Management: One rail Right Number of Stairs: 1 General stair comments: pt reports feeling confident with step at home  Wheelchair Mobility    Modified Rankin (Stroke Patients Only)       Balance Overall  balance assessment: Needs assistance Sitting-balance support: No upper extremity supported;Feet supported Sitting balance-Leahy Scale: Good     Standing balance support: No upper extremity supported Standing balance-Leahy Scale: Good                      Cognition Arousal/Alertness: Awake/alert Behavior During Therapy: WFL for tasks assessed/performed Overall Cognitive Status: Within Functional Limits for tasks assessed                      Exercises      General Comments        Pertinent Vitals/Pain Pain Assessment: 0-10 Pain Score: 4  Pain Location: back Pain Descriptors / Indicators: Operative site guarding Pain Intervention(s): Limited activity within patient's tolerance;Monitored during session    Home Living                      Prior Function            PT Goals (current goals can now be found in the care plan section) Acute Rehab PT Goals Patient Stated Goal: get back to her dog PT Goal Formulation: With patient Time For Goal Achievement: 03/29/16 Potential to Achieve Goals: Good Progress towards PT goals: Progressing toward goals    Frequency    Min 5X/week      PT Plan Current plan remains appropriate    Co-evaluation             End of Session Equipment Utilized During Treatment: Gait belt;Back brace Activity Tolerance: Patient tolerated treatment well Patient left: in chair;with call bell/phone within  reach     Time: 0850-0907 PT Time Calculation (min) (ACUTE ONLY): 17 min  Charges:  $Gait Training: 8-22 mins                    G Codes:      Cassell Clement, PT, CSCS Pager [phone] Office [phone]  03/17/2016, 9:22 AM

## 2016-04-06 DIAGNOSIS — M4726 Other spondylosis with radiculopathy, lumbar region: Secondary | ICD-10-CM | POA: Diagnosis not present

## 2016-04-07 ENCOUNTER — Encounter: Payer: Self-pay | Admitting: Family Medicine

## 2016-04-19 ENCOUNTER — Encounter: Payer: Self-pay | Admitting: Family Medicine

## 2016-04-19 MED ORDER — AMOXICILLIN 875 MG PO TABS: 875.0000 mg | ORAL_TABLET | Freq: Two times a day (BID) | ORAL | 0 refills | Status: DC

## 2016-04-25 ENCOUNTER — Ambulatory Visit (INDEPENDENT_AMBULATORY_CARE_PROVIDER_SITE_OTHER): Payer: PPO | Admitting: Cardiovascular Disease

## 2016-04-25 VITALS — BP 126/77 | HR 97 | Ht 65.0 in | Wt 197.2 lb

## 2016-04-25 DIAGNOSIS — E785 Hyperlipidemia, unspecified: Secondary | ICD-10-CM

## 2016-04-25 DIAGNOSIS — R0602 Shortness of breath: Secondary | ICD-10-CM

## 2016-04-25 NOTE — Patient Instructions (Addendum)
Medication Instructions:  Your physician recommends that you continue on your current medications as directed. Please refer to the Current Medication list given to you today.  Labwork: Your physician recommends that you return for lab work in: 3 months FASTING-LIPIDS, CMP (week of July 26, 2016)  Testing/Procedures: None   Follow-Up: Your physician wants you to follow-up in: 12 months with Dr Oval Linsey. You will receive a reminder letter in the mail two months in advance. If you don't receive a letter, please call our office to schedule the follow-up appointment.  Any Other Special Instructions Will Be Listed Below (If Applicable).  If you need a refill on your cardiac medications before your next appointment, please call your pharmacy.

## 2016-04-25 NOTE — Progress Notes (Signed)
Cardiology Office Note   Date:  04/25/2016   ID:  Zayley, Arras [birth year], MRN [id number]  PCP:  [name], MD  Cardiologist:   [name], MD   Chief Complaint  Patient presents with  . Follow-up    1 year; Pt states no Sx.    Patient ID: Cheryl [name] is a 76 y.o. female with hyperlipidemia and mild carotid stenosis who presents for follow up.  She was first seen 04/2015 with a report of shortness of breath.  She had an echo 05/28/15 that revealed LVEF 55-60% with grade 1 diastolic dysfunction. She also had a Lexiscan Myoview 05/07/15 that revealed LVEF 60% with a small, fixed septal/anteroseptal defect that was felt to be artifact. There is no evidence of ischemia.  She has struggled with orthostatic hypotension and was instructed to wear compression stockings increase her salt intake. She underwent L4/L5 decompression surgery on 03/2016.  Since then she has been struggling to breathe due to her back brace.  She had mild edema after the surgery but this has improved. She denies orthopnea or PND. She is also been showing with a sinus infection and bronchitis. She has been on amoxicillin for 7 days with plans for a 10 day course. Her sputum has improved from yellow to clear and she is starting to feel better. She denies any chest pain or pressure. She had significant lightheadedness while on her narcotic pain medication but this improved after stopping the medicine one week ago.    Past Medical History:  Diagnosis Date  . Anemia   . Anxiety    d/t recent family issues  . Arthritis   . Bone spur   . Chronic back pain    stenosis and OA  . Depression    takes Paxil daily  . Diverticulosis   . Dizzy    occasionally  . Fatty liver    has had 2 Hep shots and the final one is in June 18  . GERD (gastroesophageal reflux disease)    takes Omeprazole daily  . Hepatitis 2012  . History of bronchitis    20-25 yrs ago  . History of colon polyps    precancerous  .  History of shingles   . Hyperlipidemia    takes Simvastatin daily  . IBS (irritable bowel syndrome)    more on side of constipation-takes Miralax daily  . Insomnia    takes Melatonin and Ambien nightly  . Internal hemorrhoids   . Joint pain   . Joint swelling   . Lung nodules    calcified   . Migraine headache    migraines-thinks coming from neck.Last one1/28/18  . Pneumonia    hx of-20 yrs ago-=-walking  . Renal cyst    left  . Uterine cancer ([place]) 1973    Past Surgical History:  Procedure Laterality Date  . APPENDECTOMY  1966  . cataracts     bilateral  . COLONOSCOPY    . EUS  02/10/2011   Procedure: UPPER ENDOSCOPIC ULTRASOUND (EUS) RADIAL;  Surgeon: Owens Loffler, MD;  Location: [facility];  Service: Endoscopy;  Laterality: N/A;  radial linear   . RECTOCELE REPAIR  2011  . ROTATOR CUFF REPAIR  2009   left  . TONSILLECTOMY    . UPPER GASTROINTESTINAL ENDOSCOPY    . VAGINAL HYSTERECTOMY  1973    Current Outpatient Prescriptions  Medication Sig Dispense Refill  . ALPRAZolam (XANAX) 0.5 MG tablet Take 1 tablet (0.5 mg total) by  mouth at bedtime as needed for anxiety. 15 tablet 1  . amoxicillin (AMOXIL) 875 MG tablet Take 1 tablet (875 mg total) by mouth 2 (two) times daily. 20 tablet 0  . aspirin 81 MG tablet Take 81 mg by mouth daily.      . Cholecalciferol (VITAMIN D) 1000 UNITS capsule Take 1,000 Units by mouth daily.      . fluticasone (FLONASE) 50 MCG/ACT nasal spray USE 2 SPRAYS IN EACH NOSTRIL EVERY DAY 48 g 3  . ipratropium (ATROVENT) 0.03 % nasal spray Place 2 sprays into both nostrils every 12 (twelve) hours. 30 mL 3  . MAGNESIUM PO Take 1 tablet by mouth daily.     . Melatonin 5 MG CAPS Take 5 mg by mouth at bedtime as needed. TAKE 1 CAPSULE AT BEDTIME     . MILK THISTLE EXTRACT PO Take 1 capsule by mouth daily.     Marland Kitchen omeprazole (PRILOSEC) 20 MG capsule TAKE 1 CAPSULE TWICE DAILY BEFORE A MEAL 180 capsule 3  . PARoxetine (PAXIL) 20 MG tablet TAKE 1 TABLET  EVERY MORNING 90 tablet 3  . polyethylene glycol (MIRALAX / GLYCOLAX) packet Take 17 g by mouth daily.    Marland Kitchen PREMARIN vaginal cream Place 1 Applicatorful vaginally 2 (two) times a week.   11  . RESTASIS 0.05 % ophthalmic emulsion Place 1 drop into both eyes 2 (two) times daily.     . simvastatin (ZOCOR) 40 MG tablet TAKE 1 TABLET AT BEDTIME 90 tablet 1  . traMADol (ULTRAM) 50 MG tablet Take 1 tablet (50 mg total) by mouth 2 (two) times daily as needed. 180 tablet 1  . zolpidem (AMBIEN) 5 MG tablet Take 1 tablet (5 mg total) by mouth at bedtime as needed for sleep. 30 tablet 1   Current Facility-Administered Medications  Medication Dose Route Frequency Provider Last Rate Last Dose  . 0.9 %  sodium chloride infusion  500 mL Intravenous Continuous Irene Shipper, MD        Allergies:   Fluoxetine hcl; Contrast media [iodinated diagnostic agents]; Dexamethasone; Codeine sulfate; and Sulfonamide derivatives    Social History:  The patient  reports that she quit smoking about 23 years ago. She has never used smokeless tobacco. She reports that she does not drink alcohol or use drugs.   Family History:  The patient's family history includes Alcohol abuse in her brother and father; Crohn's disease in her brother; Depression in her brother; HIV in her son; Heart failure in her daughter; Kidney failure in her mother; Lung cancer in her father and son; Rheum arthritis in her mother.    ROS:  Please see the history of present illness.   Otherwise, review of systems are positive for none.   All other systems are reviewed and negative.    PHYSICAL EXAM: VS:  BP 126/77   Pulse 97   Ht 5\' 5"  (1.651 m)   Wt 89.4 kg (197 lb 3.2 oz)   BMI 32.82 kg/m  , BMI Body mass index is 32.82 kg/m. GENERAL:  Well appearing HEENT:  Pupils equal round and reactive, fundi not visualized, oral mucosa unremarkable NECK:  No jugular venous distention, waveform within normal limits, carotid upstroke brisk and symmetric, no  bruits LYMPHATICS:  No cervical adenopathy LUNGS:  Clear to auscultation bilaterally HEART:  RRR.  PMI not displaced or sustained,S1 and S2 within normal limits, no S3, no S4, no clicks, no rubs, no murmurs ABD:  Flat, positive bowel sounds normal  in frequency in pitch, no bruits, no rebound, no guarding, no midline pulsatile mass, no hepatomegaly, no splenomegaly EXT:  2 plus pulses throughout, no edema, no cyanosis no clubbing SKIN:  No rashes no nodules NEURO:  Cranial nerves II through XII grossly intact, motor grossly intact throughout PSYCH:  Cognitively intact, oriented to person place and time   EKG:  EKG is ordered today. 04/25/16: Sinus rhythm.  Rate 97 bpm.  Low voltage precordial leads  Echo 05/28/15: Study Conclusions  - Left ventricle: The cavity size was normal. Wall thickness was  normal. Systolic function was normal. The estimated ejection  fraction was in the range of 55% to 60%. Wall motion was normal;  there were no regional wall motion abnormalities. Doppler  parameters are consistent with abnormal left ventricular  relaxation (grade 1 diastolic dysfunction).  Impressions:  - Normal LV systolic function; grade 1 diastolic dysfunction; trace  MR and TR.  Lexiscan Cardiolite 05/07/15:  Nuclear stress EF: 60%.  The left ventricular ejection fraction is normal (55-65%).  There was no ST segment deviation noted during stress.  No T wave inversion was noted during stress.  Defect 1: There is a small defect of moderate severity.  This is a low risk study.  Small size, moderate intensity fixed septal/inferoseptal perfusion defect which is likely artifact. No reversible ischemia. LVEF 60% with normal wall motion. This is a low risk study.   Recent Labs: 12/22/2015: TSH 3.32 03/07/2016: ALT 22; BUN 13; Creatinine, Ser 0.92; Hemoglobin 13.5; Platelets 244; Potassium 4.3; Sodium 138    Lipid Panel    Component Value Date/Time   CHOL 200 (H)  12/22/2015 1022   CHOL 244 (H) 03/02/2011 0045   TRIG 254 (H) 12/22/2015 1022   TRIG 303 (H) 03/02/2011 0045   HDL 43 (L) 12/22/2015 1022   HDL 45 03/02/2011 0045   CHOLHDL 4.7 12/22/2015 1022   VLDL 51 (H) 12/22/2015 1022   LDLCALC 106 (H) 12/22/2015 1022   LDLCALC 138 (H) 03/02/2011 0045      Wt Readings from Last 3 Encounters:  04/25/16 89.4 kg (197 lb 3.2 oz)  03/15/16 86.2 kg (190 lb)  03/07/16 86.6 kg (190 lb 14.4 oz)      ASSESSMENT AND PLAN:  # Shortness of breath:  Stable.  Negative stress test and Echo is unremarkable. Recommended that she continue to increase her exercise as tolerated by her back.  # Hyperlipidemia: Lipids are above goal.  She thinks that this is because her son in law was diagnosed with cancer and she was stress eating. She also hasn't been exercising due to her back surgery but hopes to start soon. Repeat lipids and CMP in 3 months.  If still elevated, switch simvastatin to rosuvastatin.  Current medicines are reviewed at length with the patient today.  The patient does not have concerns regarding medicines.  The following changes have been made:  no change  Labs/ tests ordered today include:   Orders Placed This Encounter  Procedures  . Lipid panel  . COMPLETE METABOLIC PANEL WITH GFR  . EKG 12-Lead     Disposition:   FU with Cheryl Catala C. Oval Linsey, MD, [facility] in 1 year.   This note was written with the assistance of speech recognition software.  Please excuse any transcriptional errors.  Signed, Cheryl Reaves C. Oval Linsey, MD, [facility]  04/25/2016 9:52 AM    [place]

## 2016-05-12 DIAGNOSIS — M5416 Radiculopathy, lumbar region: Secondary | ICD-10-CM | POA: Diagnosis not present

## 2016-05-13 ENCOUNTER — Encounter: Payer: Self-pay | Admitting: Family Medicine

## 2016-05-17 ENCOUNTER — Ambulatory Visit: Payer: PPO | Admitting: Family Medicine

## 2016-06-09 ENCOUNTER — Encounter: Payer: Self-pay | Admitting: Family Medicine

## 2016-06-20 ENCOUNTER — Ambulatory Visit: Payer: PPO

## 2016-06-20 ENCOUNTER — Encounter: Payer: Self-pay | Admitting: Family Medicine

## 2016-06-20 ENCOUNTER — Ambulatory Visit (INDEPENDENT_AMBULATORY_CARE_PROVIDER_SITE_OTHER): Payer: PPO | Admitting: Family Medicine

## 2016-06-20 VITALS — BP 114/52 | HR 87 | Ht 65.0 in | Wt 190.0 lb

## 2016-06-20 DIAGNOSIS — K760 Fatty (change of) liver, not elsewhere classified: Secondary | ICD-10-CM

## 2016-06-20 DIAGNOSIS — Z23 Encounter for immunization: Secondary | ICD-10-CM

## 2016-06-20 DIAGNOSIS — Z9189 Other specified personal risk factors, not elsewhere classified: Secondary | ICD-10-CM

## 2016-06-20 DIAGNOSIS — E785 Hyperlipidemia, unspecified: Secondary | ICD-10-CM | POA: Diagnosis not present

## 2016-06-20 DIAGNOSIS — Z981 Arthrodesis status: Secondary | ICD-10-CM | POA: Insufficient documentation

## 2016-06-20 DIAGNOSIS — F5101 Primary insomnia: Secondary | ICD-10-CM

## 2016-06-20 DIAGNOSIS — Z9229 Personal history of other drug therapy: Secondary | ICD-10-CM

## 2016-06-20 MED ORDER — ZOLPIDEM TARTRATE 5 MG PO TABS: 5.0000 mg | ORAL_TABLET | Freq: Every evening | ORAL | 1 refills | Status: DC | PRN

## 2016-06-20 MED ORDER — ALPRAZOLAM 0.5 MG PO TABS: 0.5000 mg | ORAL_TABLET | Freq: Every evening | ORAL | 1 refills | Status: DC | PRN

## 2016-06-20 NOTE — Progress Notes (Signed)
   Subjective:    Patient ID: Cheryl [name], female    DOB: [birth year], 76 y.o.   MRN: [id number]  HPI   She is here today to get her last hepatitis A/B vaccination. She's worried because previous insurance did not cover it. He has a diagnosis of fatty liver and it is recommended that she get vaccinated as this does put her at a higher risk of getting hepatitis B and being susceptible to complications from hepatitis.  Insomnia-she would like a refill on her Ambien 5 mg. She uses it as needed she does have chronic insomnia problems. She really tries hard not to take it every night. She's also requesting a refill on her alprazolam which she occasionally uses for sleep as well. She never mixes it with the Ambien.  She is to follow-up with [name], her cardiologist soon. They're going to recheck her cholesterol at that time and may consider adjusting her medication. She has been having more body aches and muscle pains but hasn't been able to stretch like she would like to after her recent back surgery. She is going to be seen at the end of June and have flu but will be fully released at that time.  Review of Systems     Objective:   Physical Exam  Constitutional: She is oriented to person, place, and time. She appears well-developed and well-nourished.  HENT:  Head: Normocephalic and atraumatic.  Cardiovascular: Normal rate, regular rhythm and normal heart sounds.   Pulmonary/Chest: Effort normal and breath sounds normal.  Neurological: She is alert and oriented to person, place, and time.  Skin: Skin is warm and dry.  Psychiatric: She has a normal mood and affect. Her behavior is normal.        Assessment & Plan:  Fatty liver-she is a high risk for getting hepatitis B thus recommend that she get the hepatitis A/B vaccination. If she were to actually contract hepatitis on top of having an inflamed liver she does with fatty liver then this could cause some severe  complications.  Insomnia-medications refilled. Warned about potential side effects.  Hyperlipidemia-seeing cardiology and will get lipids rechecked this summer. They may want to consider switching her to atorvastatin for a little bit better control. LDL is at 105 back in November.  Status post lumbar fusion-she is having a lot of muscle aches and pains. Suspect this is probably related to not being able to fully exercise instruction like she is used to but consider could be related to her statin as well.

## 2016-06-23 ENCOUNTER — Telehealth: Payer: Self-pay | Admitting: *Deleted

## 2016-06-23 NOTE — Telephone Encounter (Signed)
Pre Authorization sent to cover my meds.

## 2016-06-28 NOTE — Telephone Encounter (Signed)
Received fax from [facility] and Zolpidem Tartrate is approved from 06/27/16 - 02/06/17. I will call and let pharmacy know. - CF

## 2016-07-05 ENCOUNTER — Ambulatory Visit: Payer: PPO | Admitting: Sports Medicine

## 2016-07-26 ENCOUNTER — Other Ambulatory Visit: Payer: Self-pay | Admitting: Family Medicine

## 2016-08-04 DIAGNOSIS — R030 Elevated blood-pressure reading, without diagnosis of hypertension: Secondary | ICD-10-CM | POA: Diagnosis not present

## 2016-08-04 DIAGNOSIS — Z6831 Body mass index (BMI) 31.0-31.9, adult: Secondary | ICD-10-CM | POA: Diagnosis not present

## 2016-08-04 DIAGNOSIS — M4726 Other spondylosis with radiculopathy, lumbar region: Secondary | ICD-10-CM | POA: Diagnosis not present

## 2016-08-05 DIAGNOSIS — E785 Hyperlipidemia, unspecified: Secondary | ICD-10-CM | POA: Diagnosis not present

## 2016-08-06 LAB — COMPLETE METABOLIC PANEL WITH GFR
ALT: 18 U/L (ref 6–29)
AST: 23 U/L (ref 10–35)
Albumin: 4.3 g/dL (ref 3.6–5.1)
Alkaline Phosphatase: 98 U/L (ref 33–130)
BUN: 13 mg/dL (ref 7–25)
CO2: 23 mmol/L (ref 20–31)
Calcium: 9.7 mg/dL (ref 8.6–10.4)
Chloride: 104 mmol/L (ref 98–110)
Creat: 1.01 mg/dL — ABNORMAL HIGH (ref 0.60–0.93)
GFR, Est African American: 63 mL/min (ref >=60)
GFR, Est Non African American: 55 mL/min — ABNORMAL LOW (ref >=60)
Glucose, Bld: 96 mg/dL (ref 65–99)
Potassium: 4.6 mmol/L (ref 3.5–5.3)
Sodium: 139 mmol/L (ref 135–146)
Total Bilirubin: 0.3 mg/dL (ref 0.2–1.2)
Total Protein: 6.9 g/dL (ref 6.1–8.1)

## 2016-08-06 LAB — LIPID PANEL
Cholesterol: 192 mg/dL (ref <200)
HDL: 44 mg/dL — ABNORMAL LOW (ref >50)
LDL Cholesterol: 110 mg/dL — ABNORMAL HIGH (ref <100)
Total CHOL/HDL Ratio: 4.4 Ratio (ref <5.0)
Triglycerides: 191 mg/dL — ABNORMAL HIGH (ref <150)
VLDL: 38 mg/dL — ABNORMAL HIGH (ref <30)

## 2016-08-17 ENCOUNTER — Telehealth: Payer: Self-pay | Admitting: *Deleted

## 2016-08-17 DIAGNOSIS — R300 Dysuria: Secondary | ICD-10-CM

## 2016-08-17 NOTE — Telephone Encounter (Signed)
Ok to place order and go to lab for sample. If at any point she feels like she's getting nauseated vomiting or running a fever then she needs to let us know immediately.

## 2016-08-17 NOTE — Telephone Encounter (Signed)
Pt called today complaining of dysuria for "several days".  The only other symptom is cloudy in appearance. Denies fever, chills, n/v. She is requesting to go to the lab in [place].  She is requesting this because Nicole Kindred is in the hospital having a negative reaction to the immunotherapy that was recently started and she doesn't want to go too far from him.  Please advise if you're ok with her giving a sample at the lab instead of making an appointment here in the office.

## 2016-08-17 NOTE — Telephone Encounter (Signed)
Labs ordered pt notified

## 2016-08-18 DIAGNOSIS — R300 Dysuria: Secondary | ICD-10-CM | POA: Diagnosis not present

## 2016-08-18 LAB — URINALYSIS
Bilirubin Urine: NEGATIVE
Glucose, UA: NEGATIVE
Ketones, ur: NEGATIVE
Nitrite: NEGATIVE
Protein, ur: NEGATIVE
Specific Gravity, Urine: 1.017 (ref 1.001–1.035)
pH: 7.5 (ref 5.0–8.0)

## 2016-08-20 LAB — URINE CULTURE

## 2016-08-22 MED ORDER — CIPROFLOXACIN HCL 500 MG PO TABS: 500.0000 mg | ORAL_TABLET | Freq: Two times a day (BID) | ORAL | 0 refills | Status: DC

## 2016-08-22 MED ORDER — CIPROFLOXACIN HCL 500 MG PO TABS
500.0000 mg | ORAL_TABLET | Freq: Two times a day (BID) | ORAL | 0 refills | Status: AC
Start: 2016-08-22 — End: 2016-08-25

## 2016-08-22 NOTE — Addendum Note (Signed)
Addended by: [name] D on: 08/22/2016 07:39 AM   Modules accepted: Orders

## 2016-08-25 ENCOUNTER — Telehealth: Payer: Self-pay | Admitting: *Deleted

## 2016-08-25 DIAGNOSIS — E785 Hyperlipidemia, unspecified: Secondary | ICD-10-CM

## 2016-08-25 NOTE — Telephone Encounter (Signed)
-----   Message from [name], MD sent at 08/13/2016  9:14 PM EDT ----- Triglycerides are better, but cholesterol is otherwise unchanged from her last results.  Recommend switching from simvastatin to rosuvastatin 20 mg daily.  Kidney and liver function are normal.

## 2016-08-25 NOTE — Telephone Encounter (Signed)
Spoke with patient and she is currently only taking Simvastatin 40 mg 1/2 tablet daily and not watching her diet. Currently has about 120 tablets on hand and would like to use up before changing.  Discussed with Raquel and will have her increase to full tablet and recheck in about 8 weeks. Advised patient, verbalized understanding.

## 2016-09-26 ENCOUNTER — Encounter: Payer: Self-pay | Admitting: Family Medicine

## 2016-09-26 ENCOUNTER — Ambulatory Visit (INDEPENDENT_AMBULATORY_CARE_PROVIDER_SITE_OTHER): Payer: PPO | Admitting: Family Medicine

## 2016-09-26 ENCOUNTER — Ambulatory Visit (INDEPENDENT_AMBULATORY_CARE_PROVIDER_SITE_OTHER): Payer: PPO

## 2016-09-26 VITALS — BP 125/52 | HR 91 | Wt 191.0 lb

## 2016-09-26 DIAGNOSIS — M898X6 Other specified disorders of bone, lower leg: Secondary | ICD-10-CM | POA: Diagnosis not present

## 2016-09-26 DIAGNOSIS — M79672 Pain in left foot: Principal | ICD-10-CM

## 2016-09-26 DIAGNOSIS — Z23 Encounter for immunization: Secondary | ICD-10-CM | POA: Diagnosis not present

## 2016-09-26 DIAGNOSIS — M7662 Achilles tendinitis, left leg: Secondary | ICD-10-CM

## 2016-09-26 DIAGNOSIS — M7661 Achilles tendinitis, right leg: Secondary | ICD-10-CM | POA: Diagnosis not present

## 2016-09-26 DIAGNOSIS — M79669 Pain in unspecified lower leg: Secondary | ICD-10-CM | POA: Diagnosis not present

## 2016-09-26 DIAGNOSIS — M79662 Pain in left lower leg: Secondary | ICD-10-CM

## 2016-09-26 DIAGNOSIS — M79671 Pain in right foot: Secondary | ICD-10-CM

## 2016-09-26 DIAGNOSIS — M79661 Pain in right lower leg: Secondary | ICD-10-CM | POA: Diagnosis not present

## 2016-09-26 MED ORDER — PAROXETINE HCL 20 MG PO TABS: 20.0000 mg | ORAL_TABLET | Freq: Every morning | ORAL | 3 refills | Status: DC

## 2016-09-26 NOTE — Progress Notes (Signed)
Subjective:    Patient ID: Cheryl [name], female    DOB: 25-Jun-1940, 76 y.o.   MRN: [id number]  HPI Bilat ear pain for a few weeks. Feels like the sounds is coming and going. Worse on the left.  She has had some problems with her sinuses recently. She says she's been using her Flonase regularly. She has another nasal spray at home as well that she got last fall but can't remember the name of it. She has not been using that recently  She also complains that her right Achilles has been bothering her. Occasionally it will just get sharp shooting pains in it. No specific trigger. Sometimes though she gets pain over the lateral portion of the lower shin as well. Sometimes the pain occurs at the same time. The pain nutrition will actually wake her up at night and ache sometimes. She hasn't been actively taking medications or prescriptions for it.  She also completes of tingling in both feet bilaterally she says it comes and goes. It's not super bothersome. She wonders if she could be developing some neuropathy.      Review of Systems  BP (!) 125/52   Pulse 91   Wt 191 lb (86.6 kg)   SpO2 97%   BMI 31.78 kg/m     Allergies  Allergen Reactions  . Fluoxetine Hcl Shortness Of Breath and Itching    REACTION: itching,SOB  . Contrast Media [Iodinated Diagnostic Agents] Itching    Multilance.   . Dexamethasone Diarrhea and Nausea And Vomiting  . Codeine Sulfate Itching    REACTION: itching  . Sulfonamide Derivatives Nausea Only    REACTION: Nausea    Past Medical History:  Diagnosis Date  . Anemia   . Anxiety    d/t recent family issues  . Arthritis   . Bone spur   . Chronic back pain    stenosis and OA  . Depression    takes Paxil daily  . Diverticulosis   . Dizzy    occasionally  . Fatty liver    has had 2 Hep shots and the final one is in June 18  . GERD (gastroesophageal reflux disease)    takes Omeprazole daily  . Hepatitis 2012  . History of bronchitis    20-25 yrs ago   . History of colon polyps    precancerous  . History of shingles   . Hyperlipidemia    takes Simvastatin daily  . IBS (irritable bowel syndrome)    more on side of constipation-takes Miralax daily  . Insomnia    takes Melatonin and Ambien nightly  . Internal hemorrhoids   . Joint pain   . Joint swelling   . Lung nodules    calcified   . Migraine headache    migraines-thinks coming from neck.Last one1/28/18  . Pneumonia    hx of-20 yrs ago-=-walking  . Renal cyst    left  . Uterine cancer ([place]) 1973    Past Surgical History:  Procedure Laterality Date  . APPENDECTOMY  1966  . cataracts     bilateral  . COLONOSCOPY    . EUS  02/10/2011   Procedure: UPPER ENDOSCOPIC ULTRASOUND (EUS) RADIAL;  Surgeon: Owens Loffler, MD;  Location: [facility];  Service: Endoscopy;  Laterality: N/A;  radial linear   . RECTOCELE REPAIR  2011  . ROTATOR CUFF REPAIR  2009   left  . TONSILLECTOMY    . UPPER GASTROINTESTINAL ENDOSCOPY    . VAGINAL HYSTERECTOMY  69    Social History   Social History  . Marital status: Legally Separated    Spouse name: N/A  . Number of children: 4  . Years of education: N/A   Occupational History  . Retired   Retired   Social History Main Topics  . Smoking status: Former Smoker    Quit date: 02/07/1993  . Smokeless tobacco: Never Used  . Alcohol use No  . Drug use: No  . Sexual activity: Not on file   Other Topics Concern  . Not on file   Social History Narrative   Walking daily.              Epworth Sleepiness Scale = 1 (as of 04/10/15)    Family History  Problem Relation Age of Onset  . Alcohol abuse Father   . Lung cancer Father        smoker  . Kidney failure Mother        med induced  . Rheum arthritis Mother   . Depression Brother   . Heart disease Unknown        Paternal family   . Heart failure Daughter   . Lung cancer Son   . Crohn's disease Brother   . Alcohol abuse Brother   . HIV Son   . Malignant hyperthermia Neg Hx    . Colon cancer Neg Hx   . Esophageal cancer Neg Hx   . Rectal cancer Neg Hx   . Stomach cancer Neg Hx     Outpatient Encounter Prescriptions as of 09/26/2016  Medication Sig  . ALPRAZolam (XANAX) 0.5 MG tablet Take 1 tablet (0.5 mg total) by mouth at bedtime as needed for anxiety.  Marland Kitchen aspirin 81 MG tablet Take 81 mg by mouth daily.    . Cholecalciferol (VITAMIN D) 1000 UNITS capsule Take 1,000 Units by mouth daily.    . fluticasone (FLONASE) 50 MCG/ACT nasal spray USE 2 SPRAYS IN EACH NOSTRIL EVERY DAY  . ipratropium (ATROVENT) 0.03 % nasal spray Place 2 sprays into both nostrils every 12 (twelve) hours.  Marland Kitchen MAGNESIUM PO Take 1 tablet by mouth daily.   . Melatonin 5 MG CAPS Take 5 mg by mouth at bedtime as needed. TAKE 1 CAPSULE AT BEDTIME   . MILK THISTLE EXTRACT PO Take 1 capsule by mouth daily.   Marland Kitchen omeprazole (PRILOSEC) 20 MG capsule TAKE 1 CAPSULE TWICE DAILY BEFORE A MEAL  . PARoxetine (PAXIL) 20 MG tablet Take 1 tablet (20 mg total) by mouth every morning.  . polyethylene glycol (MIRALAX / GLYCOLAX) packet Take 17 g by mouth daily.  Marland Kitchen PREMARIN vaginal cream Place 1 Applicatorful vaginally 2 (two) times a week.   . RESTASIS 0.05 % ophthalmic emulsion Place 1 drop into both eyes 2 (two) times daily.   . simvastatin (ZOCOR) 40 MG tablet TAKE 1 TABLET AT BEDTIME  . traMADol (ULTRAM) 50 MG tablet TAKE 1 TABLET BY MOUTH 2 TIMES DAILY AS NEEDED  . zolpidem (AMBIEN) 5 MG tablet Take 1 tablet (5 mg total) by mouth at bedtime as needed for sleep.  . [DISCONTINUED] PARoxetine (PAXIL) 20 MG tablet TAKE 1 TABLET EVERY MORNING   Facility-Administered Encounter Medications as of 09/26/2016  Medication  . 0.9 %  sodium chloride infusion          Objective:   Physical Exam  Constitutional: She is oriented to person, place, and time. She appears well-developed and well-nourished.  HENT:  Head: Normocephalic and atraumatic.  Right  Ear: External ear normal.  Left Ear: External ear normal.   Nose: Nose normal.  Mouth/Throat: Oropharynx is clear and moist.  TMs and canals are clear. On her left tympanic membrane in the right upper quadrant there is a little bit of thickening of the membrane there but no discoloration or erythema.  Eyes: Pupils are equal, round, and reactive to light. Conjunctivae and EOM are normal.  Neck: Neck supple. No thyromegaly present.  Cardiovascular: Normal rate, regular rhythm and normal heart sounds.   Pulmonary/Chest: Effort normal and breath sounds normal. She has no wheezes.  Lymphadenopathy:    She has no cervical adenopathy.  Neurological: She is alert and oriented to person, place, and time.  Skin: Skin is warm and dry.  Psychiatric: She has a normal mood and affect.          Assessment & Plan:  Right achilles tendonitis - H.O given with exercises. Could consider more formal physical therapy if needed.  Shin pain  - Could be shin splints. She was immobilized somewhat after surgery back in January for couple of months and then started exercising and walking normally. Though she has not increased any type of walking or exercise regimen in the last month or 2. Consider that it could be low vitamin D. This can often caution pain and tenderness as well. Custom orthotics could also be helpful. I am going to go ahead and get an x-ray as well just because the pain has been waking her up at night just to rule out any type of bone tumor etc.  Bilateral ear fullness and pain-exam is completely normal today. I do not see anything worrisome no sign of infection etc. We'll try adding Atrovent to her to take his own 100 try this for the next 2 weeks if it's still not helping then consider a round of oral prednisone and if that's not helping then consider ENT referral.    Tingling in both feet-she denies any numbness. Will hold off on additional workup at this time.

## 2016-09-26 NOTE — Patient Instructions (Signed)
Use both nasal sprays for her nose and ears.  If her are better in about 2 weeks, then call and will try round of prednison

## 2016-09-27 LAB — VITAMIN D 25 HYDROXY (VIT D DEFICIENCY, FRACTURES): Vit D, 25-Hydroxy: 30 ng/mL (ref 30–100)

## 2016-09-30 ENCOUNTER — Other Ambulatory Visit: Payer: Self-pay | Admitting: *Deleted

## 2016-09-30 ENCOUNTER — Encounter: Payer: Self-pay | Admitting: Family Medicine

## 2016-09-30 DIAGNOSIS — F419 Anxiety disorder, unspecified: Secondary | ICD-10-CM

## 2016-09-30 MED ORDER — PAROXETINE HCL 20 MG PO TABS: 20.0000 mg | ORAL_TABLET | Freq: Every morning | ORAL | 3 refills | Status: DC

## 2016-09-30 NOTE — Progress Notes (Signed)
rx sent to [facility]

## 2016-10-04 ENCOUNTER — Ambulatory Visit (INDEPENDENT_AMBULATORY_CARE_PROVIDER_SITE_OTHER): Payer: PPO | Admitting: Podiatry

## 2016-10-04 ENCOUNTER — Encounter: Payer: Self-pay | Admitting: Podiatry

## 2016-10-04 VITALS — BP 140/62 | HR 88 | Ht 65.0 in | Wt 190.0 lb

## 2016-10-04 DIAGNOSIS — G629 Polyneuropathy, unspecified: Secondary | ICD-10-CM

## 2016-10-04 DIAGNOSIS — M6700 Short Achilles tendon (acquired), unspecified ankle: Secondary | ICD-10-CM | POA: Diagnosis not present

## 2016-10-04 DIAGNOSIS — M7661 Achilles tendinitis, right leg: Secondary | ICD-10-CM

## 2016-10-04 DIAGNOSIS — M7662 Achilles tendinitis, left leg: Secondary | ICD-10-CM | POA: Diagnosis not present

## 2016-10-04 NOTE — Progress Notes (Signed)
SUBJECTIVE: 76 y.o. year old female presents with sharp shooting pain in back of both heels x 2 weeks. 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 Denies being diabetic. Left ankle turned 10 years ago that is causing numbness on lateral side. Starting to have some numbness over toe area like having neuroma.  Seen Dr. Wardell Honour 8-10 years ago and was told she has neuroma on right foot.  Patient is wearing OTC hardshell orthotics.  Hx of back surgery 7 month ago and doing well.  REVIEW OF SYSTEMS: Pertinent items noted in HPI and remainder of comprehensive ROS otherwise negative.  OBJECTIVE: DERMATOLOGIC EXAMINATION: Normal findings.  VASCULAR EXAMINATION OF LOWER LIMBS: All pedal pulses are palpable with normal pulsation.  Capillary Filling times within 3 seconds in all digits.  No edema or erythema noted. Temperature gradient from tibial crest to dorsum of foot is within normal bilateral.  NEUROLOGIC EXAMINATION OF THE LOWER LIMBS: Achilles DTR is present and within normal. Monofilament (Semmes-Weinstein 10-gm) sensory testing positive on right, diminished on left. Vibratory sensations(128Hz  turning fork) intact at medial and lateral forefoot bilateral.  Sharp and Dull discriminatory sensations at the plantar ball of hallux is intact bilateral.   MUSCULOSKELETAL EXAMINATION: Positive for tight Achilles tendon bilateral.  ASSESSMENT: Contracted Achilles tendon bilateral. Tendonitis Achilles tendon bilateral. Forefoot varus bilateral. Neuroma right forefoot. Lesser metatarsalgia left foot.  PLAN: Reviewed clinical findings and available treatment options. Reviewed daily stretch exercise for tight Achilles tendon. Rx Compound cream to apply as needed for posterior heel pain. Recommended custom orthotics.

## 2016-10-04 NOTE — Patient Instructions (Signed)
Seen for bilateral heel pain on back of Achilles tendon insertion area. Also noted of tight Achilles tendon bilateral. Reviewed daily stretch exercise. May benefit from compound cream and custom orthotics.

## 2016-10-31 DIAGNOSIS — E785 Hyperlipidemia, unspecified: Secondary | ICD-10-CM | POA: Diagnosis not present

## 2016-10-31 LAB — COMPREHENSIVE METABOLIC PANEL
ALT: 19 IU/L (ref 0–32)
AST: 26 IU/L (ref 0–40)
Albumin/Globulin Ratio: 1.8 (ref 1.2–2.2)
Albumin: 4.3 g/dL (ref 3.5–4.8)
Alkaline Phosphatase: 95 IU/L (ref 39–117)
BUN/Creatinine Ratio: 15 (ref 12–28)
BUN: 14 mg/dL (ref 8–27)
Bilirubin Total: 0.4 mg/dL (ref 0.0–1.2)
CO2: 22 mmol/L (ref 20–29)
Calcium: 10 mg/dL (ref 8.7–10.3)
Chloride: 101 mmol/L (ref 96–106)
Creatinine, Ser: 0.93 mg/dL (ref 0.57–1.00)
GFR calc Af Amer: 70 mL/min/{1.73_m2} (ref >59)
GFR calc non Af Amer: 61 mL/min/{1.73_m2} (ref >59)
Globulin, Total: 2.4 g/dL (ref 1.5–4.5)
Glucose: 103 mg/dL — ABNORMAL HIGH (ref 65–99)
Potassium: 4.4 mmol/L (ref 3.5–5.2)
Sodium: 138 mmol/L (ref 134–144)
Total Protein: 6.7 g/dL (ref 6.0–8.5)

## 2016-10-31 LAB — LIPID PANEL
Chol/HDL Ratio: 3.9 ratio (ref 0.0–4.4)
Cholesterol, Total: 179 mg/dL (ref 100–199)
HDL: 46 mg/dL (ref >39)
LDL Calculated: 101 mg/dL — ABNORMAL HIGH (ref 0–99)
Triglycerides: 160 mg/dL — ABNORMAL HIGH (ref 0–149)
VLDL Cholesterol Cal: 32 mg/dL (ref 5–40)

## 2016-11-08 ENCOUNTER — Ambulatory Visit (INDEPENDENT_AMBULATORY_CARE_PROVIDER_SITE_OTHER): Payer: PPO | Admitting: Family Medicine

## 2016-11-08 ENCOUNTER — Encounter: Payer: Self-pay | Admitting: Family Medicine

## 2016-11-08 DIAGNOSIS — M48061 Spinal stenosis, lumbar region without neurogenic claudication: Secondary | ICD-10-CM | POA: Diagnosis not present

## 2016-11-08 DIAGNOSIS — M79604 Pain in right leg: Secondary | ICD-10-CM | POA: Diagnosis not present

## 2016-11-08 DIAGNOSIS — M79605 Pain in left leg: Secondary | ICD-10-CM | POA: Diagnosis not present

## 2016-11-08 DIAGNOSIS — Z981 Arthrodesis status: Secondary | ICD-10-CM

## 2016-11-08 DIAGNOSIS — M791 Myalgia, unspecified site: Secondary | ICD-10-CM

## 2016-11-08 NOTE — Patient Instructions (Addendum)
Stop your cholesterol pill totally for a month. -

## 2016-11-08 NOTE — Progress Notes (Signed)
Subjective:    Patient ID: Cheryl [name], female    DOB: 18-Jan-1941, 76 y.o.   MRN: [id number]  HPI 76 year old female comes in today complaining of tightness stiffness that starts in the posterior buttock area and radiates down the outer portions of her legs into her lower legs and feet. She says it's been going on for couple months and been getting gradually worse. Infectious actually recently saw podiatry for this. She was seen in August and diagnosed with Achilles tendinitis and neuropathy. She says some days her feet and calves hurt so bad it's difficult for her to walk. Interestingly, her statin was increased to 40 mg about 4 months ago. He has had some more neck and back pain but is hoping to get back in with her orthopedist to have another injection done in her neck sometime next month. She does not have known osteoporosis of both knees. She was told by podiatry that her tendons are very "tight". She has been trying to do stretches has worn the special insoles that were recommended. The podiatrist also also recommended a compounded cream that was quite expensive. So getting some tingling in the bottoms of both feet.  She also feels like she's been a little bit more forgetful recently has also been under a lot of stress with her son-in-law with whom she lives has had advancing cancer that has now metastasized to his brain. He has not responded to chemotherapy or gamma knife treatment.  Review of Systems  There were no vitals taken for this visit.    Allergies  Allergen Reactions  . Fluoxetine Hcl Shortness Of Breath and Itching    REACTION: itching,SOB  . Contrast Media [Iodinated Diagnostic Agents] Itching    Multilance.   . Dexamethasone Diarrhea and Nausea And Vomiting  . Codeine Sulfate Itching    REACTION: itching  . Sulfonamide Derivatives Nausea Only    REACTION: Nausea    Past Medical History:  Diagnosis Date  . Anemia   . Anxiety    d/t recent family issues  .  Arthritis   . Bone spur   . Chronic back pain    stenosis and OA  . Depression    takes Paxil daily  . Diverticulosis   . Dizzy    occasionally  . Fatty liver    has had 2 Hep shots and the final one is in June 18  . GERD (gastroesophageal reflux disease)    takes Omeprazole daily  . Hepatitis 2012  . History of bronchitis    20-25 yrs ago  . History of colon polyps    precancerous  . History of shingles   . Hyperlipidemia    takes Simvastatin daily  . IBS (irritable bowel syndrome)    more on side of constipation-takes Miralax daily  . Insomnia    takes Melatonin and Ambien nightly  . Internal hemorrhoids   . Joint pain   . Joint swelling   . Lung nodules    calcified   . Migraine headache    migraines-thinks coming from neck.Last one1/28/18  . Pneumonia    hx of-20 yrs ago-=-walking  . Renal cyst    left  . Uterine cancer ([place]) 1973    Past Surgical History:  Procedure Laterality Date  . APPENDECTOMY  1966  . cataracts     bilateral  . COLONOSCOPY    . EUS  02/10/2011   Procedure: UPPER ENDOSCOPIC ULTRASOUND (EUS) RADIAL;  Surgeon: Owens Loffler, MD;  Location:  [facility];  Service: Endoscopy;  Laterality: N/A;  radial linear   . RECTOCELE REPAIR  2011  . ROTATOR CUFF REPAIR  2009   left  . TONSILLECTOMY    . UPPER GASTROINTESTINAL ENDOSCOPY    . VAGINAL HYSTERECTOMY  1973    Social History   Social History  . Marital status: Legally Separated    Spouse name: N/A  . Number of children: 4  . Years of education: N/A   Occupational History  . Retired   Retired   Social History Main Topics  . Smoking status: Former Smoker    Quit date: 02/07/1993  . Smokeless tobacco: Never Used  . Alcohol use No  . Drug use: No  . Sexual activity: Not on file   Other Topics Concern  . Not on file   Social History Narrative   Walking daily.              Epworth Sleepiness Scale = 1 (as of 04/10/15)    Family History  Problem Relation Age of Onset  .  Alcohol abuse Father   . Lung cancer Father        smoker  . Kidney failure Mother        med induced  . Rheum arthritis Mother   . Depression Brother   . Heart disease Unknown        Paternal family   . Heart failure Daughter   . Lung cancer Son   . Crohn's disease Brother   . Alcohol abuse Brother   . HIV Son   . Malignant hyperthermia Neg Hx   . Colon cancer Neg Hx   . Esophageal cancer Neg Hx   . Rectal cancer Neg Hx   . Stomach cancer Neg Hx     Outpatient Encounter Prescriptions as of 11/08/2016  Medication Sig  . ALPRAZolam (XANAX) 0.5 MG tablet Take 1 tablet (0.5 mg total) by mouth at bedtime as needed for anxiety.  Marland Kitchen aspirin 81 MG tablet Take 81 mg by mouth daily.    . Cholecalciferol (VITAMIN D) 1000 UNITS capsule Take 1,000 Units by mouth daily.    . fluticasone (FLONASE) 50 MCG/ACT nasal spray USE 2 SPRAYS IN EACH NOSTRIL EVERY DAY  . ipratropium (ATROVENT) 0.03 % nasal spray Place 2 sprays into both nostrils every 12 (twelve) hours.  Marland Kitchen MAGNESIUM PO Take 1 tablet by mouth daily.   . Melatonin 5 MG CAPS Take 5 mg by mouth at bedtime as needed. TAKE 1 CAPSULE AT BEDTIME   . MILK THISTLE EXTRACT PO Take 1 capsule by mouth daily.   Marland Kitchen omeprazole (PRILOSEC) 20 MG capsule TAKE 1 CAPSULE TWICE DAILY BEFORE A MEAL  . PARoxetine (PAXIL) 20 MG tablet Take 1 tablet (20 mg total) by mouth every morning.  . polyethylene glycol (MIRALAX / GLYCOLAX) packet Take 17 g by mouth daily.  Marland Kitchen PREMARIN vaginal cream Place 1 Applicatorful vaginally 2 (two) times a week.   . RESTASIS 0.05 % ophthalmic emulsion Place 1 drop into both eyes 2 (two) times daily.   . simvastatin (ZOCOR) 40 MG tablet TAKE 1 TABLET AT BEDTIME  . traMADol (ULTRAM) 50 MG tablet TAKE 1 TABLET BY MOUTH 2 TIMES DAILY AS NEEDED  . zolpidem (AMBIEN) 5 MG tablet Take 1 tablet (5 mg total) by mouth at bedtime as needed for sleep.   Facility-Administered Encounter Medications as of 11/08/2016  Medication  . 0.9 %  sodium  chloride infusion  Objective:   Physical Exam  Constitutional: She is oriented to person, place, and time. She appears well-developed and well-nourished.  HENT:  Head: Normocephalic and atraumatic.  Cardiovascular: Normal rate, regular rhythm and normal heart sounds.   Pulmonary/Chest: Effort normal and breath sounds normal.  Musculoskeletal:  Nontender over the lumbar spine and SI joints. Nontender greater trochanters bilaterally. Hip, knee, ankle strength is 5 out of 5 bilaterally. Patellar reflexes 1+ bilaterally. Negative straight leg raise bilaterally. Normal flexion, extension and rotation right and left at the waist.  Neurological: She is alert and oriented to person, place, and time.  Skin: Skin is warm and dry.  Psychiatric: She has a normal mood and affect. Her behavior is normal.          Assessment & Plan:  Lower extremity pain bilaterally and stiffness-consider multiple etiologies. This certainly could be more radicular as it does seem to be coming from the buttock area along the sciatic nerve. Though it's bilateral and very symmetric. Consider this could also be related to recent increase in her statin. Will check CK levels. Also check for thyroid disorder and as well as electrolyte disorder. If everything is negative then consider reevaluation of her low back as she has had a prior history of lumbar fusion and lumbar stenosis. May even need EMG studies. If not improving after cessation of the statin for couple of weeks then we'll consider EMG studies for further evaluation. She will call me back in a couple weeks and let me know how she's doing.

## 2016-11-09 LAB — BASIC METABOLIC PANEL
BUN/Creatinine Ratio: 14 (calc) (ref 6–22)
BUN: 15 mg/dL (ref 7–25)
CO2: 27 mmol/L (ref 20–32)
Calcium: 9.7 mg/dL (ref 8.6–10.4)
Chloride: 105 mmol/L (ref 98–110)
Creat: 1.04 mg/dL — ABNORMAL HIGH (ref 0.60–0.93)
Glucose, Bld: 90 mg/dL (ref 65–99)
Potassium: 4.6 mmol/L (ref 3.5–5.3)
Sodium: 140 mmol/L (ref 135–146)

## 2016-11-09 LAB — TSH: TSH: 3.13 mIU/L (ref 0.40–4.50)

## 2016-11-09 LAB — CK: Total CK: 141 U/L (ref 29–143)

## 2016-11-14 ENCOUNTER — Encounter: Payer: Self-pay | Admitting: Family Medicine

## 2016-12-05 ENCOUNTER — Other Ambulatory Visit: Payer: Self-pay | Admitting: Family Medicine

## 2016-12-05 DIAGNOSIS — Z1231 Encounter for screening mammogram for malignant neoplasm of breast: Secondary | ICD-10-CM

## 2016-12-22 ENCOUNTER — Ambulatory Visit (INDEPENDENT_AMBULATORY_CARE_PROVIDER_SITE_OTHER): Payer: PPO | Admitting: Family Medicine

## 2016-12-22 ENCOUNTER — Encounter: Payer: Self-pay | Admitting: Family Medicine

## 2016-12-22 VITALS — BP 128/63 | HR 78 | Ht 65.0 in | Wt 191.0 lb

## 2016-12-22 DIAGNOSIS — Z0000 Encounter for general adult medical examination without abnormal findings: Secondary | ICD-10-CM | POA: Diagnosis not present

## 2016-12-22 DIAGNOSIS — Z6831 Body mass index (BMI) 31.0-31.9, adult: Secondary | ICD-10-CM | POA: Diagnosis not present

## 2016-12-22 DIAGNOSIS — H938X3 Other specified disorders of ear, bilateral: Secondary | ICD-10-CM

## 2016-12-22 MED ORDER — PITAVASTATIN CALCIUM 1 MG PO TABS: 1.0000 mg | ORAL_TABLET | Freq: Every day | ORAL | 1 refills | Status: DC

## 2016-12-22 NOTE — Progress Notes (Signed)
Subjective:   Cheryl [name] is a 76 y.o. female who presents for Medicare Annual (Subsequent) preventive examination.  She did feel much better off the statin.  She had called complaining of leg aches and headaches.  We decided to hold her simvastatin for a while and see if she felt better.  She says it has been a dramatic difference off of the medication.  She is felt very fatigued recently.  She lives with her daughter and son-in-law.  Her son-in-law unfortunately has metastatic cancer and is probably close to the end of life.  Plus her daughter's father passed away about a month ago they have been dealing with his estate.  Review of Systems:  Comprehensive review of systems is negative.       Objective:     Vitals: BP 128/63   Pulse 78   Ht 5\' 5"  (1.651 m)   Wt 191 lb (86.6 kg)   SpO2 95%   BMI 31.78 kg/m   Body mass index is 31.78 kg/m.   Tobacco Social History   Tobacco Use  Smoking Status Former Smoker  . Last attempt to quit: 02/07/1993  . Years since quitting: 23.8  Smokeless Tobacco Never Used     Counseling given: Not Answered   Past Medical History:  Diagnosis Date  . Anemia   . Anxiety    d/t recent family issues  . Arthritis   . Bone spur   . Chronic back pain    stenosis and OA  . Depression    takes Paxil daily  . Diverticulosis   . Dizzy    occasionally  . Fatty liver    has had 2 Hep shots and the final one is in June 18  . GERD (gastroesophageal reflux disease)    takes Omeprazole daily  . Hepatitis 2012  . History of bronchitis    20-25 yrs ago  . History of colon polyps    precancerous  . History of shingles   . Hyperlipidemia    takes Simvastatin daily  . IBS (irritable bowel syndrome)    more on side of constipation-takes Miralax daily  . Insomnia    takes Melatonin and Ambien nightly  . Internal hemorrhoids   . Joint pain   . Joint swelling   . Lung nodules    calcified   . Migraine headache    migraines-thinks coming  from neck.Last one1/28/18  . Pneumonia    hx of-20 yrs ago-=-walking  . Renal cyst    left  . Uterine cancer ([place]) 1973   Past Surgical History:  Procedure Laterality Date  . APPENDECTOMY  1966  . cataracts     bilateral  . COLONOSCOPY    . EUS  02/10/2011   Procedure: UPPER ENDOSCOPIC ULTRASOUND (EUS) RADIAL;  Surgeon: Owens Loffler, MD;  Location: [facility];  Service: Endoscopy;  Laterality: N/A;  radial linear   . RECTOCELE REPAIR  2011  . ROTATOR CUFF REPAIR  2009   left  . TONSILLECTOMY    . UPPER GASTROINTESTINAL ENDOSCOPY    . VAGINAL HYSTERECTOMY  1973   Family History  Problem Relation Age of Onset  . Alcohol abuse Father   . Lung cancer Father        smoker  . Kidney failure Mother        med induced  . Rheum arthritis Mother   . Depression Brother   . Heart disease Unknown        Paternal family   .  Heart failure Daughter   . Lung cancer Son   . Crohn's disease Brother   . Alcohol abuse Brother   . HIV Son   . Malignant hyperthermia Neg Hx   . Colon cancer Neg Hx   . Esophageal cancer Neg Hx   . Rectal cancer Neg Hx   . Stomach cancer Neg Hx    Social History   Substance and Sexual Activity  Sexual Activity Not on file    Outpatient Encounter Medications as of 12/22/2016  Medication Sig  . ALPRAZolam (XANAX) 0.5 MG tablet Take 1 tablet (0.5 mg total) by mouth at bedtime as needed for anxiety.  Marland Kitchen aspirin 81 MG tablet Take 81 mg by mouth daily.    . Cholecalciferol (VITAMIN D) 1000 UNITS capsule Take 1,000 Units by mouth daily.    . fluticasone (FLONASE) 50 MCG/ACT nasal spray USE 2 SPRAYS IN EACH NOSTRIL EVERY DAY  . ipratropium (ATROVENT) 0.03 % nasal spray Place 2 sprays into both nostrils every 12 (twelve) hours.  Marland Kitchen MAGNESIUM PO Take 1 tablet by mouth daily.   . Melatonin 5 MG CAPS Take 5 mg by mouth at bedtime as needed. TAKE 1 CAPSULE AT BEDTIME   . omeprazole (PRILOSEC) 20 MG capsule TAKE 1 CAPSULE TWICE DAILY BEFORE A MEAL  . PARoxetine  (PAXIL) 20 MG tablet Take 1 tablet (20 mg total) by mouth every morning.  . polyethylene glycol (MIRALAX / GLYCOLAX) packet Take 17 g by mouth daily.  Marland Kitchen PREMARIN vaginal cream Place 1 Applicatorful vaginally 2 (two) times a week.   . RESTASIS 0.05 % ophthalmic emulsion Place 1 drop into both eyes 2 (two) times daily.   . simvastatin (ZOCOR) 40 MG tablet TAKE 1 TABLET AT BEDTIME  . traMADol (ULTRAM) 50 MG tablet TAKE 1 TABLET BY MOUTH 2 TIMES DAILY AS NEEDED  . zolpidem (AMBIEN) 5 MG tablet Take 1 tablet (5 mg total) by mouth at bedtime as needed for sleep.  Marland Kitchen MILK THISTLE EXTRACT PO Take 1 capsule by mouth daily.   . Pitavastatin Calcium (LIVALO) 1 MG TABS Take 1 tablet (1 mg total) at bedtime by mouth.   Facility-Administered Encounter Medications as of 12/22/2016  Medication  . 0.9 %  sodium chloride infusion    Activities of Daily Living In your present state of health, do you have any difficulty performing the following activities: 12/22/2016 03/07/2016  Hearing? Y N  Vision? N N  Difficulty concentrating or making decisions? N N  Walking or climbing stairs? N Y  Dressing or bathing? N N  Doing errands, shopping? N N  Some recent data might be hidden    Patient Care Team: Hali Marry, MD as PCP - General Irene Shipper, MD as Attending Physician (Gastroenterology) Clydell Hakim, MD as Attending Physician (Pain Medicine) Calvert Cantor, MD as Consulting Physician (Ophthalmology)    Assessment:    Medicare WEllness Exam  Exercise Activities and Dietary recommendations Current Exercise Habits: Home exercise routine, Type of exercise: walking, Intensity: Mild  Goals    None     Fall Risk Fall Risk  12/22/2016 12/22/2015 12/17/2013 12/20/2012 11/22/2012  Falls in the past year? No No No Yes No  Number falls in past yr: - - - 2 or more -   Depression Screen PHQ 2/9 Scores 12/22/2016 12/22/2015 12/17/2013 12/20/2012  PHQ - 2 Score 0 0 0 0  PHQ- 9 Score - - - -      Cognitive Function  6CIT Screen 12/22/2016 12/22/2015  What Year? 0 points 0 points  What month? 0 points 0 points  What time? 0 points 0 points  Count back from 20 0 points 0 points  Months in reverse 0 points 0 points  Repeat phrase 2 points 0 points  Total Score 2 0    Immunization History  Administered Date(s) Administered  . Hep A / Hep B 12/22/2015, 01/19/2016, 06/20/2016  . Influenza Split 12/20/2011  . Influenza Whole 11/08/2004, 11/08/2005, 11/24/2006, 11/12/2007, 12/15/2008, 11/27/2009  . Influenza, High Dose Seasonal PF 09/26/2016  . Influenza,inj,Quad PF,6+ Mos 10/10/2012, 10/01/2013, 12/31/2014, 10/05/2015  . Pneumococcal Conjugate-13 12/17/2013  . Pneumococcal Polysaccharide-23 02/07/2005, 12/15/2010  . Td 02/07/2002  . Tdap 10/10/2012  . Zoster 11/27/2009   Screening Tests Health Maintenance  Topic Date Due  . MAMMOGRAM  01/17/2018  . COLONOSCOPY  11/03/2020  . TETANUS/TDAP  10/11/2022  . INFLUENZA VACCINE  Completed  . DEXA SCAN  Completed  . PNA vac Low Risk Adult  Completed      Plan:      I have personally reviewed and noted the following in the patient's chart:   . Medical and social history . Use of alcohol, tobacco or illicit drugs  . Current medications and supplements . Functional ability and status . Nutritional status . Physical activity - some walking.  . Advanced directives- Yes, living will.  . List of other physicians - updated.  Marland Kitchen Hospitalizations, surgeries, and ER visits in previous 12 months . Vitals . Screenings to include cognitive, depression, and falls . Referrals and appointments . Statin intolerance-added simvastatin to her intolerance list.  She is willing to try a second medication so we will send over prescription for Livalo. . Fatigue-I think it is most likely related to increased stress levels.  Just encouraged her to continue to exercise and walk and try to get adequate sleep. . MI 31-continue work on  healthy diet and regular exercise. . Lateral ear fullness-we will refer back to Dr. Wilburn Cornelia who she had seen previously  In addition, I have reviewed and discussed with patient certain preventive protocols, quality metrics, and best practice recommendations. A written personalized care plan for preventive services as well as general preventive health recommendations were provided to patient.     [name], MD  12/22/2016

## 2016-12-22 NOTE — Patient Instructions (Addendum)
Let's try Livalo for your cholesterol should be around $15 for a 30 days supply to try it.    Health Maintenance, Female Adopting a healthy lifestyle and getting preventive care can go a long way to promote health and wellness. Talk with your health care provider about what schedule of regular examinations is right for you. This is a good chance for you to check in with your provider about disease prevention and staying healthy. In between checkups, there are plenty of things you can do on your own. Experts have done a lot of research about which lifestyle changes and preventive measures are most likely to keep you healthy. Ask your health care provider for more information. Weight and diet Eat a healthy diet  Be sure to include plenty of vegetables, fruits, low-fat dairy products, and lean protein.  Do not eat a lot of foods high in solid fats, added sugars, or salt.  Get regular exercise. This is one of the most important things you can do for your health. ? Most adults should exercise for at least 150 minutes each week. The exercise should increase your heart rate and make you sweat (moderate-intensity exercise). ? Most adults should also do strengthening exercises at least twice a week. This is in addition to the moderate-intensity exercise.  Maintain a healthy weight  Body mass index (BMI) is a measurement that can be used to identify possible weight problems. It estimates body fat based on height and weight. Your health care provider can help determine your BMI and help you achieve or maintain a healthy weight.  For females 92 years of age and older: ? A BMI below 18.5 is considered underweight. ? A BMI of 18.5 to 24.9 is normal. ? A BMI of 25 to 29.9 is considered overweight. ? A BMI of 30 and above is considered obese.  Watch levels of cholesterol and blood lipids  You should start having your blood tested for lipids and cholesterol at 74 years of age, then have this test every 5  years.  You may need to have your cholesterol levels checked more often if: ? Your lipid or cholesterol levels are high. ? You are older than 74 years of age. ? You are at high risk for heart disease.  Cancer screening Lung Cancer  Lung cancer screening is recommended for adults 80-43 years old who are at high risk for lung cancer because of a history of smoking.  A yearly low-dose CT scan of the lungs is recommended for people who: ? Currently smoke. ? Have quit within the past 15 years. ? Have at least a 30-pack-year history of smoking. A pack year is smoking an average of one pack of cigarettes a day for 1 year.  Yearly screening should continue until it has been 15 years since you quit.  Yearly screening should stop if you develop a health problem that would prevent you from having lung cancer treatment.  Breast Cancer  Practice breast self-awareness. This means understanding how your breasts normally appear and feel.  It also means doing regular breast self-exams. Let your health care provider know about any changes, no matter how small.  If you are in your 20s or 30s, you should have a clinical breast exam (CBE) by a health care provider every 1-3 years as part of a regular health exam.  If you are 96 or older, have a CBE every year. Also consider having a breast X-ray (mammogram) every year.  If you have a  family history of breast cancer, talk to your health care provider about genetic screening.  If you are at high risk for breast cancer, talk to your health care provider about having an MRI and a mammogram every year.  Breast cancer gene (BRCA) assessment is recommended for women who have family members with BRCA-related cancers. BRCA-related cancers include: ? Breast. ? Ovarian. ? Tubal. ? Peritoneal cancers.  Results of the assessment will determine the need for genetic counseling and BRCA1 and BRCA2 testing.  Cervical Cancer Your health care provider may  recommend that you be screened regularly for cancer of the pelvic organs (ovaries, uterus, and vagina). This screening involves a pelvic examination, including checking for microscopic changes to the surface of your cervix (Pap test). You may be encouraged to have this screening done every 3 years, beginning at age 47.  For women ages 52-65, health care providers may recommend pelvic exams and Pap testing every 3 years, or they may recommend the Pap and pelvic exam, combined with testing for human papilloma virus (HPV), every 5 years. Some types of HPV increase your risk of cervical cancer. Testing for HPV may also be done on women of any age with unclear Pap test results.  Other health care providers may not recommend any screening for nonpregnant women who are considered low risk for pelvic cancer and who do not have symptoms. Ask your health care provider if a screening pelvic exam is right for you.  If you have had past treatment for cervical cancer or a condition that could lead to cancer, you need Pap tests and screening for cancer for at least 20 years after your treatment. If Pap tests have been discontinued, your risk factors (such as having a new sexual partner) need to be reassessed to determine if screening should resume. Some women have medical problems that increase the chance of getting cervical cancer. In these cases, your health care provider may recommend more frequent screening and Pap tests.  Colorectal Cancer  This type of cancer can be detected and often prevented.  Routine colorectal cancer screening usually begins at 74 years of age and continues through 74 years of age.  Your health care provider may recommend screening at an earlier age if you have risk factors for colon cancer.  Your health care provider may also recommend using home test kits to check for hidden blood in the stool.  A small camera at the end of a tube can be used to examine your colon directly  (sigmoidoscopy or colonoscopy). This is done to check for the earliest forms of colorectal cancer.  Routine screening usually begins at age 66.  Direct examination of the colon should be repeated every 5-10 years through 74 years of age. However, you may need to be screened more often if early forms of precancerous polyps or small growths are found.  Skin Cancer  Check your skin from head to toe regularly.  Tell your health care provider about any new moles or changes in moles, especially if there is a change in a mole's shape or color.  Also tell your health care provider if you have a mole that is larger than the size of a pencil eraser.  Always use sunscreen. Apply sunscreen liberally and repeatedly throughout the day.  Protect yourself by wearing long sleeves, pants, a wide-brimmed hat, and sunglasses whenever you are outside.  Heart disease, diabetes, and high blood pressure  High blood pressure causes heart disease and increases the risk of  stroke. High blood pressure is more likely to develop in: ? People who have blood pressure in the high end of the normal range (130-139/85-89 mm Hg). ? People who are overweight or obese. ? People who are African American.  If you are 32-32 years of age, have your blood pressure checked every 3-5 years. If you are 79 years of age or older, have your blood pressure checked every year. You should have your blood pressure measured twice-once when you are at a hospital or clinic, and once when you are not at a hospital or clinic. Record the average of the two measurements. To check your blood pressure when you are not at a hospital or clinic, you can use: ? An automated blood pressure machine at a pharmacy. ? A home blood pressure monitor.  If you are between 33 years and 15 years old, ask your health care provider if you should take aspirin to prevent strokes.  Have regular diabetes screenings. This involves taking a blood sample to check your  fasting blood sugar level. ? If you are at a normal weight and have a low risk for diabetes, have this test once every three years after 74 years of age. ? If you are overweight and have a high risk for diabetes, consider being tested at a younger age or more often. Preventing infection Hepatitis B  If you have a higher risk for hepatitis B, you should be screened for this virus. You are considered at high risk for hepatitis B if: ? You were born in a country where hepatitis B is common. Ask your health care provider which countries are considered high risk. ? Your parents were born in a high-risk country, and you have not been immunized against hepatitis B (hepatitis B vaccine). ? You have HIV or AIDS. ? You use needles to inject street drugs. ? You live with someone who has hepatitis B. ? You have had sex with someone who has hepatitis B. ? You get hemodialysis treatment. ? You take certain medicines for conditions, including cancer, organ transplantation, and autoimmune conditions.  Hepatitis C  Blood testing is recommended for: ? Everyone born from [birth year] through 1963. ? Anyone with known risk factors for hepatitis C.  Sexually transmitted infections (STIs)  You should be screened for sexually transmitted infections (STIs) including gonorrhea and chlamydia if: ? You are sexually active and are younger than 74 years of age. ? You are older than 74 years of age and your health care provider tells you that you are at risk for this type of infection. ? Your sexual activity has changed since you were last screened and you are at an increased risk for chlamydia or gonorrhea. Ask your health care provider if you are at risk.  If you do not have HIV, but are at risk, it may be recommended that you take a prescription medicine daily to prevent HIV infection. This is called pre-exposure prophylaxis (PrEP). You are considered at risk if: ? You are sexually active and do not regularly use condoms  or know the HIV status of your partner(s). ? You take drugs by injection. ? You are sexually active with a partner who has HIV.  Talk with your health care provider about whether you are at high risk of being infected with HIV. If you choose to begin PrEP, you should first be tested for HIV. You should then be tested every 3 months for as long as you are taking PrEP. Pregnancy  If  you are premenopausal and you may become pregnant, ask your health care provider about preconception counseling.  If you may become pregnant, take 400 to 800 micrograms (mcg) of folic acid every day.  If you want to prevent pregnancy, talk to your health care provider about birth control (contraception). Osteoporosis and menopause  Osteoporosis is a disease in which the bones lose minerals and strength with aging. This can result in serious bone fractures. Your risk for osteoporosis can be identified using a bone density scan.  If you are 46 years of age or older, or if you are at risk for osteoporosis and fractures, ask your health care provider if you should be screened.  Ask your health care provider whether you should take a calcium or vitamin D supplement to lower your risk for osteoporosis.  Menopause may have certain physical symptoms and risks.  Hormone replacement therapy may reduce some of these symptoms and risks. Talk to your health care provider about whether hormone replacement therapy is right for you. Follow these instructions at home:  Schedule regular health, dental, and eye exams.  Stay current with your immunizations.  Do not use any tobacco products including cigarettes, chewing tobacco, or electronic cigarettes.  If you are pregnant, do not drink alcohol.  If you are breastfeeding, limit how much and how often you drink alcohol.  Limit alcohol intake to no more than 1 drink per day for nonpregnant women. One drink equals 12 ounces of beer, 5 ounces of wine, or 1 ounces of hard  liquor.  Do not use street drugs.  Do not share needles.  Ask your health care provider for help if you need support or information about quitting drugs.  Tell your health care provider if you often feel depressed.  Tell your health care provider if you have ever been abused or do not feel safe at home. This information is not intended to replace advice given to you by your health care provider. Make sure you discuss any questions you have with your health care provider. Document Released: 08/09/2010 Document Revised: 07/02/2015 Document Reviewed: 10/28/2014 Elsevier Interactive Patient Education  [employer].

## 2016-12-26 ENCOUNTER — Encounter: Payer: Self-pay | Admitting: Family Medicine

## 2016-12-26 MED ORDER — ROSUVASTATIN CALCIUM 10 MG PO TABS: 10.0000 mg | ORAL_TABLET | Freq: Every day | ORAL | 0 refills | Status: DC

## 2017-01-05 ENCOUNTER — Encounter: Payer: Self-pay | Admitting: Family Medicine

## 2017-01-06 MED ORDER — ZOLPIDEM TARTRATE 5 MG PO TABS: 5.0000 mg | ORAL_TABLET | Freq: Every evening | ORAL | 3 refills | Status: DC | PRN

## 2017-01-13 ENCOUNTER — Encounter: Payer: Self-pay | Admitting: Family Medicine

## 2017-01-18 ENCOUNTER — Encounter: Payer: Self-pay | Admitting: Family Medicine

## 2017-01-20 MED ORDER — PITAVASTATIN CALCIUM 2 MG PO TABS: 2.0000 mg | ORAL_TABLET | Freq: Every day | ORAL | 2 refills | Status: DC

## 2017-01-20 NOTE — Telephone Encounter (Signed)
Okay, thank you for giving it a try.  I will add it to your intolerance list.  I will send over the prescription for Livalo and we can at least give it a try.  You can even start with every other night if you wants.  Studies show that there is still a decent benefit even if you do not take it every day and sometimes that can help reduce the risk for the aches and cramps.

## 2017-01-23 ENCOUNTER — Other Ambulatory Visit: Payer: Self-pay | Admitting: Family Medicine

## 2017-01-23 MED ORDER — ROSUVASTATIN CALCIUM 10 MG PO TABS: 10.0000 mg | ORAL_TABLET | Freq: Every day | ORAL | 0 refills | Status: DC

## 2017-01-24 DIAGNOSIS — H524 Presbyopia: Secondary | ICD-10-CM | POA: Diagnosis not present

## 2017-01-24 DIAGNOSIS — H5211 Myopia, right eye: Secondary | ICD-10-CM | POA: Diagnosis not present

## 2017-01-24 DIAGNOSIS — H52222 Regular astigmatism, left eye: Secondary | ICD-10-CM | POA: Diagnosis not present

## 2017-01-24 DIAGNOSIS — H5212 Myopia, left eye: Secondary | ICD-10-CM | POA: Diagnosis not present

## 2017-01-25 ENCOUNTER — Other Ambulatory Visit: Payer: Self-pay | Admitting: *Deleted

## 2017-01-25 DIAGNOSIS — J342 Deviated nasal septum: Secondary | ICD-10-CM | POA: Diagnosis not present

## 2017-01-25 DIAGNOSIS — J302 Other seasonal allergic rhinitis: Secondary | ICD-10-CM | POA: Insufficient documentation

## 2017-01-25 DIAGNOSIS — H9193 Unspecified hearing loss, bilateral: Secondary | ICD-10-CM | POA: Diagnosis not present

## 2017-01-25 DIAGNOSIS — H6983 Other specified disorders of Eustachian tube, bilateral: Secondary | ICD-10-CM | POA: Diagnosis not present

## 2017-01-25 MED ORDER — ROSUVASTATIN CALCIUM 10 MG PO TABS: 10.0000 mg | ORAL_TABLET | Freq: Every day | ORAL | 3 refills | Status: DC

## 2017-01-29 ENCOUNTER — Encounter: Payer: Self-pay | Admitting: Family Medicine

## 2017-02-07 ENCOUNTER — Encounter: Payer: Self-pay | Admitting: Family Medicine

## 2017-02-08 DIAGNOSIS — M542 Cervicalgia: Secondary | ICD-10-CM | POA: Diagnosis not present

## 2017-02-08 DIAGNOSIS — R51 Headache: Secondary | ICD-10-CM | POA: Diagnosis not present

## 2017-02-08 DIAGNOSIS — M47812 Spondylosis without myelopathy or radiculopathy, cervical region: Secondary | ICD-10-CM | POA: Diagnosis not present

## 2017-02-08 DIAGNOSIS — G4486 Cervicogenic headache: Secondary | ICD-10-CM | POA: Insufficient documentation

## 2017-02-08 MED ORDER — EZETIMIBE 10 MG PO TABS: 10.0000 mg | ORAL_TABLET | Freq: Every day | ORAL | 5 refills | Status: DC

## 2017-02-17 ENCOUNTER — Ambulatory Visit
Admission: RE | Admit: 2017-02-17 | Discharge: 2017-02-17 | Disposition: A | Discharge status: Home / Self Care | Payer: PPO | Source: Ambulatory Visit | Attending: Family Medicine | Admitting: Family Medicine

## 2017-02-17 DIAGNOSIS — Z1231 Encounter for screening mammogram for malignant neoplasm of breast: Secondary | ICD-10-CM

## 2017-03-09 DIAGNOSIS — M47812 Spondylosis without myelopathy or radiculopathy, cervical region: Secondary | ICD-10-CM | POA: Diagnosis not present

## 2017-03-14 DIAGNOSIS — H903 Sensorineural hearing loss, bilateral: Secondary | ICD-10-CM | POA: Diagnosis not present

## 2017-03-22 ENCOUNTER — Other Ambulatory Visit: Payer: Self-pay | Admitting: Family Medicine

## 2017-03-22 ENCOUNTER — Encounter: Payer: Self-pay | Admitting: Family Medicine

## 2017-03-22 MED ORDER — FLUTICASONE PROPIONATE 50 MCG/ACT NA SUSP: 2.0000 | Freq: Every day | NASAL | 3 refills | Status: DC

## 2017-03-31 ENCOUNTER — Encounter: Payer: Self-pay | Admitting: Family Medicine

## 2017-04-11 ENCOUNTER — Other Ambulatory Visit: Payer: Self-pay | Admitting: Family Medicine

## 2017-04-11 NOTE — Telephone Encounter (Signed)
Pt looked up in [place]. She last refilled this medication on 10/25/16 #15. Routed to pcp for signature and review.Elouise Munroe, [place]

## 2017-04-18 DIAGNOSIS — R51 Headache: Secondary | ICD-10-CM | POA: Diagnosis not present

## 2017-04-18 DIAGNOSIS — H10413 Chronic giant papillary conjunctivitis, bilateral: Secondary | ICD-10-CM | POA: Diagnosis not present

## 2017-04-18 DIAGNOSIS — H35372 Puckering of macula, left eye: Secondary | ICD-10-CM | POA: Diagnosis not present

## 2017-04-18 DIAGNOSIS — H04123 Dry eye syndrome of bilateral lacrimal glands: Secondary | ICD-10-CM | POA: Diagnosis not present

## 2017-04-20 ENCOUNTER — Encounter: Payer: Self-pay | Admitting: Family Medicine

## 2017-04-24 ENCOUNTER — Ambulatory Visit: Payer: PPO | Admitting: Cardiovascular Disease

## 2017-04-24 ENCOUNTER — Encounter: Payer: Self-pay | Admitting: Cardiovascular Disease

## 2017-04-24 VITALS — BP 120/70 | HR 76 | Ht 65.0 in | Wt 194.6 lb

## 2017-04-24 DIAGNOSIS — E785 Hyperlipidemia, unspecified: Secondary | ICD-10-CM

## 2017-04-24 DIAGNOSIS — I6523 Occlusion and stenosis of bilateral carotid arteries: Secondary | ICD-10-CM

## 2017-04-24 DIAGNOSIS — R0602 Shortness of breath: Secondary | ICD-10-CM

## 2017-04-24 DIAGNOSIS — Z5181 Encounter for therapeutic drug level monitoring: Secondary | ICD-10-CM | POA: Diagnosis not present

## 2017-04-24 NOTE — Patient Instructions (Addendum)
Medication Instructions:  Your physician recommends that you continue on your current medications as directed. Please refer to the Current Medication list given to you today.  Labwork: FASTING LP/CMET IN ABOUT 1 MONTH   Testing/Procedures: Your physician has requested that you have a carotid duplex. This test is an ultrasound of the carotid arteries in your neck. It looks at blood flow through these arteries that supply the brain with blood. Allow one hour for this exam. There are no restrictions or special instructions.  Follow-Up: Your physician wants you to follow-up in: [place] will receive a reminder letter in the mail two months in advance. If you don't receive a letter, please call our office to schedule the follow-up appointment.  If you need a refill on your cardiac medications before your next appointment, please call your pharmacy.

## 2017-04-24 NOTE — Progress Notes (Signed)
Cardiology Office Note   Date:  04/24/2017   ID:  Cheryl [name], Cheryl [name] [birth year], MRN [id number]  PCP:  Hali Marry, MD  Cardiologist:   [name], MD   Chief Complaint  Patient presents with  . Follow-up   Patient ID: Cheryl [name] is a 77 y.o. female with hyperlipidemia and mild carotid stenosis who presents for follow up.  She was first seen 04/2015 with a report of shortness of breath.  She had an echo 05/28/15 that revealed LVEF 55-60% with grade 1 diastolic dysfunction. She also had a Lexiscan Myoview 05/07/15 that revealed LVEF 60% with a small, fixed septal/anteroseptal defect that was felt to be artifact. There is no evidence of ischemia.  She has struggled with orthostatic hypotension and was instructed to wear compression stockings increase her salt intake. She underwent L4/L5 decompression surgery on 03/2016.  She then struggled to breathe due to her back brace.    Since her last appointment Cheryl [name] has been well physically.  She has been under a lot of stress lately.  Her son-in-law passed away from cancer 2017-02-21.  Less than 1 month later her daughter was diagnosed with throat cancer.  She is undergone treatment successfully and is thought to be in remission.  Cheryl [name] has not experienced any chest pain or shortness of breath.  She has no lower extremity edema, orthopnea, or PND.  She has been walking for 30 minutes daily because she has to walk her dog.  She notes that her diet has been poor.  She eats more when stressed and likes sweets.  At her last appointment simvastatin was discontinued due to myalgias.  Since that time she tried rosuvastatin and also developed myalgias.  She was switched to pitavastatin and initially developed myalgias.  However the dose was reduced to every other day and subsequently increased back to daily.  Since making that change 2 months ago she has not had any recurrent myalgias.   Past Medical History:  Diagnosis Date  . Anemia    . Anxiety    d/t recent family issues  . Arthritis   . Bone spur   . Chronic back pain    stenosis and OA  . Depression    takes Paxil daily  . Diverticulosis   . Dizzy    occasionally  . Fatty liver    has had 2 Hep shots and the final one is in June 18  . GERD (gastroesophageal reflux disease)    takes Omeprazole daily  . Hepatitis 2012  . History of bronchitis    20-25 yrs ago  . History of colon polyps    precancerous  . History of shingles   . Hyperlipidemia    takes Simvastatin daily  . IBS (irritable bowel syndrome)    more on side of constipation-takes Miralax daily  . Insomnia    takes Melatonin and Ambien nightly  . Internal hemorrhoids   . Joint pain   . Joint swelling   . Lung nodules    calcified   . Migraine headache    migraines-thinks coming from neck.Last one1/28/18  . Pneumonia    hx of-20 yrs ago-=-walking  . Renal cyst    left  . Uterine cancer ([place]) 1973    Past Surgical History:  Procedure Laterality Date  . APPENDECTOMY  1966  . cataracts     bilateral  . COLONOSCOPY    . EUS  02/10/2011   Procedure: UPPER ENDOSCOPIC ULTRASOUND (  EUS) RADIAL;  Surgeon: Owens Loffler, MD;  Location: [facility];  Service: Endoscopy;  Laterality: N/A;  radial linear   . RECTOCELE REPAIR  2011  . ROTATOR CUFF REPAIR  2009   left  . TONSILLECTOMY    . UPPER GASTROINTESTINAL ENDOSCOPY    . VAGINAL HYSTERECTOMY  1973    Current Outpatient Medications  Medication Sig Dispense Refill  . ALPRAZolam (XANAX) 0.5 MG tablet TAKE 1 TABLET BY MOUTH AT BEDTIME AS NEEDED FOR ANXIETY 15 tablet 1  . aspirin 81 MG tablet Take 81 mg by mouth daily.      . Cholecalciferol (VITAMIN D) 1000 UNITS capsule Take 1,000 Units by mouth daily.      Marland Kitchen LIVALO 2 MG TABS TAKE 1 TABLET BY MOUTH AT BEDTIME. 30 tablet 2  . MAGNESIUM PO Take 1 tablet by mouth daily.     . Melatonin 5 MG CAPS Take 5 mg by mouth at bedtime as needed. TAKE 1 CAPSULE AT BEDTIME     . MILK THISTLE EXTRACT  PO Take 1 capsule by mouth daily.     Marland Kitchen omeprazole (PRILOSEC) 20 MG capsule TAKE 1 CAPSULE TWICE DAILY BEFORE A MEAL 180 capsule 3  . PARoxetine (PAXIL) 20 MG tablet Take 1 tablet (20 mg total) by mouth every morning. 90 tablet 3  . polyethylene glycol (MIRALAX / GLYCOLAX) packet Take 17 g by mouth daily.    . RESTASIS 0.05 % ophthalmic emulsion Place 1 drop into both eyes 2 (two) times daily.     . traMADol (ULTRAM) 50 MG tablet TAKE 1 TABLET BY MOUTH 2 TIMES DAILY AS NEEDED 180 tablet 1  . zolpidem (AMBIEN) 5 MG tablet Take 1 tablet (5 mg total) by mouth at bedtime as needed for sleep. 30 tablet 3   Current Facility-Administered Medications  Medication Dose Route Frequency Provider Last Rate Last Dose  . 0.9 %  sodium chloride infusion  500 mL Intravenous Continuous Irene Shipper, MD        Allergies:   Fluoxetine hcl; Contrast media [iodinated diagnostic agents]; Dexamethasone; Livalo [pitavastatin]; Rosuvastatin; Simvastatin; Codeine sulfate; and Sulfonamide derivatives    Social History:  The patient  reports that she quit smoking about 24 years ago. she has never used smokeless tobacco. She reports that she does not drink alcohol or use drugs.   Family History:  The patient's family history includes Alcohol abuse in her brother and father; Crohn's disease in her brother; Depression in her brother; HIV in her son; Heart disease in her unknown relative; Heart failure in her daughter; Kidney failure in her mother; Lung cancer in her father and son; Rheum arthritis in her mother.    ROS:  Please see the history of present illness.   Otherwise, review of systems are positive for none.   All other systems are reviewed and negative.    PHYSICAL EXAM: VS:  BP 120/70   Pulse 76   Ht 5\' 5"  (1.651 m)   Wt 194 lb 9.6 oz (88.3 kg)   BMI 32.38 kg/m  , BMI Body mass index is 32.38 kg/m. GENERAL:  Well appearing HEENT: Pupils equal round and reactive, fundi not visualized, oral mucosa  unremarkable NECK:  No jugular venous distention, waveform within normal limits, carotid upstroke brisk and symmetric, no bruits LUNGS:  Clear to auscultation bilaterally HEART:  RRR.  PMI not displaced or sustained,S1 and S2 within normal limits, no S3, no S4, no clicks, no rubs, no murmurs ABD:  Flat,  positive bowel sounds normal in frequency in pitch, no bruits, no rebound, no guarding, no midline pulsatile mass, no hepatomegaly, no splenomegaly EXT:  2 plus pulses throughout, no edema, no cyanosis no clubbing SKIN:  No rashes no nodules NEURO:  Cranial nerves II through XII grossly intact, motor grossly intact throughout PSYCH:  Cognitively intact, oriented to person place and time  EKG:  EKG is ordered today. 04/25/16: Sinus rhythm.  Rate 97 bpm.  Low voltage precordial leads 04/24/17: Sinus rhythm.  Rate 73 bpm.    Echo 05/28/15: Study Conclusions  - Left ventricle: The cavity size was normal. Wall thickness was  normal. Systolic function was normal. The estimated ejection  fraction was in the range of 55% to 60%. Wall motion was normal;  there were no regional wall motion abnormalities. Doppler  parameters are consistent with abnormal left ventricular  relaxation (grade 1 diastolic dysfunction).  Impressions:  - Normal LV systolic function; grade 1 diastolic dysfunction; trace  MR and TR.  Lexiscan Cardiolite 05/07/15:  Nuclear stress EF: 60%.  The left ventricular ejection fraction is normal (55-65%).  There was no ST segment deviation noted during stress.  No T wave inversion was noted during stress.  Defect 1: There is a small defect of moderate severity.  This is a low risk study.  Small size, moderate intensity fixed septal/inferoseptal perfusion defect which is likely artifact. No reversible ischemia. LVEF 60% with normal wall motion. This is a low risk study.   Recent Labs: 10/31/2016: ALT 19 11/08/2016: BUN 15; Creat 1.04; Potassium 4.6; Sodium 140;  TSH 3.13    Lipid Panel    Component Value Date/Time   CHOL 179 10/31/2016 0857   CHOL 244 (H) 03/02/2011 0045   TRIG 160 (H) 10/31/2016 0857   TRIG 303 (H) 03/02/2011 0045   HDL 46 10/31/2016 0857   HDL 45 03/02/2011 0045   CHOLHDL 3.9 10/31/2016 0857   CHOLHDL 4.4 08/05/2016 0906   VLDL 38 (H) 08/05/2016 0906   LDLCALC 101 (H) 10/31/2016 0857   LDLCALC 138 (H) 03/02/2011 0045      Wt Readings from Last 3 Encounters:  04/24/17 194 lb 9.6 oz (88.3 kg)  12/22/16 191 lb (86.6 kg)  10/04/16 190 lb (86.2 kg)      ASSESSMENT AND PLAN:  # Shortness of breath:  Resolved.  Negative stress test and Echo is unremarkable. Recommended that she continue to increase her exercise and make sure she is walking at a brisk pace.  # Hyperlipidemia: She has struggled with several statins.  She is currently on pitavastatin and tolerating it.  She will return for lipids and a CMP in 1 month.     # Carotid stenosis:  Mild disease in 2015.  Will repeat carotid Dopplers.     Current medicines are reviewed at length with the patient today.  The patient does not have concerns regarding medicines.  The following changes have been made:  no change  Labs/ tests ordered today include:   Orders Placed This Encounter  Procedures  . Lipid panel  . Comprehensive metabolic panel  . EKG 12-Lead     Disposition:   FU with Cheryl Pettry C. Oval Linsey, MD, [facility] in 1 year.    Signed, Cheryl Kirchoff C. Oval Linsey, MD, [facility]  04/24/2017 2:11 PM    [facility]

## 2017-04-25 ENCOUNTER — Ambulatory Visit: Payer: PPO | Admitting: Cardiovascular Disease

## 2017-05-04 ENCOUNTER — Encounter: Payer: Self-pay | Admitting: Family Medicine

## 2017-05-04 ENCOUNTER — Ambulatory Visit (INDEPENDENT_AMBULATORY_CARE_PROVIDER_SITE_OTHER): Payer: PPO | Admitting: Family Medicine

## 2017-05-04 VITALS — BP 122/64 | HR 77 | Temp 98.4°F | Ht 65.0 in | Wt 191.0 lb

## 2017-05-04 DIAGNOSIS — G4452 New daily persistent headache (NDPH): Secondary | ICD-10-CM

## 2017-05-04 DIAGNOSIS — J0110 Acute frontal sinusitis, unspecified: Secondary | ICD-10-CM

## 2017-05-04 MED ORDER — PROPRANOLOL HCL 10 MG PO TABS: 10.0000 mg | ORAL_TABLET | Freq: Two times a day (BID) | ORAL | 1 refills | Status: DC

## 2017-05-04 MED ORDER — AZITHROMYCIN 250 MG PO TABS: ORAL_TABLET | ORAL | 0 refills | Status: AC

## 2017-05-04 NOTE — Progress Notes (Signed)
Subjective:    Patient ID: Cheryl [name], female    DOB: 02-Mar-1940, 77 y.o.   MRN: [id number]  HPI 22 you female with 1 week of sinus pain pressure and headaches. Pos tnasal drip.  Yellow muscous. No fever, chills or sweats. Tried OTC sinus medication. Went to the dentist last week.  No GI sxs.    She also complains of frequent headaches that have been going on for about a year.  She says sometimes she will have about 1 a week and then sometimes she will have them day after day consecutively.  She says once they start the usually last all day.  Most of time they are frontal and occipital.  Sometimes temporal.  Usually bilateral at the time.  She said it was so severe yesterday that she took an old narcotic pill that she had left over but did not seem to really help very much.  She is at the point where she is tired of having frequent headaches.  She denies any aura but says that when they start their sudden and usually start frontally.  She says she is been told in the past that there is sinus headaches but she is also been told of the possibly migraines.  Review of Systems  BP 122/64   Pulse 77   Temp 98.4 F (36.9 C)   Ht 5\' 5"  (1.651 m)   Wt 191 lb (86.6 kg)   SpO2 97%   BMI 31.78 kg/m     Allergies  Allergen Reactions  . Fluoxetine Hcl Shortness Of Breath and Itching    REACTION: itching,SOB  . Contrast Media [Iodinated Diagnostic Agents] Itching    Multilance.   . Dexamethasone Diarrhea and Nausea And Vomiting  . Livalo [Pitavastatin] Other (See Comments)    Myalgia   . Metrizamide Itching    Multilance.   . Rosuvastatin Other (See Comments)    bodyaches and cramping  . Simvastatin Other (See Comments)    Muscle aches and HA  . Codeine Sulfate Itching    REACTION: itching  . Sulfasalazine Nausea Only    REACTION: Nausea  . Sulfonamide Derivatives Nausea Only    REACTION: Nausea    Past Medical History:  Diagnosis Date  . Anemia   . Anxiety    d/t recent family  issues  . Arthritis   . Bone spur   . Chronic back pain    stenosis and OA  . Depression    takes Paxil daily  . Diverticulosis   . Dizzy    occasionally  . Fatty liver    has had 2 Hep shots and the final one is in June 18  . GERD (gastroesophageal reflux disease)    takes Omeprazole daily  . Hepatitis 2012  . History of bronchitis    20-25 yrs ago  . History of colon polyps    precancerous  . History of shingles   . Hyperlipidemia    takes Simvastatin daily  . IBS (irritable bowel syndrome)    more on side of constipation-takes Miralax daily  . Insomnia    takes Melatonin and Ambien nightly  . Internal hemorrhoids   . Joint pain   . Joint swelling   . Lung nodules    calcified   . Migraine headache    migraines-thinks coming from neck.Last one1/28/18  . Pneumonia    hx of-20 yrs ago-=-walking  . Renal cyst    left  . Uterine cancer ([place]) 1973  Past Surgical History:  Procedure Laterality Date  . APPENDECTOMY  1966  . cataracts     bilateral  . COLONOSCOPY    . EUS  02/10/2011   Procedure: UPPER ENDOSCOPIC ULTRASOUND (EUS) RADIAL;  Surgeon: Owens Loffler, MD;  Location: [facility];  Service: Endoscopy;  Laterality: N/A;  radial linear   . RECTOCELE REPAIR  2011  . ROTATOR CUFF REPAIR  2009   left  . TONSILLECTOMY    . UPPER GASTROINTESTINAL ENDOSCOPY    . VAGINAL HYSTERECTOMY  1973    Social History   Socioeconomic History  . Marital status: Legally Separated    Spouse name: Not on file  . Number of children: 4  . Years of education: Not on file  . Highest education level: Not on file  Occupational History  . Occupation: Retired      Fish farm manager: RETIRED  Social Needs  . Financial resource strain: Not on file  . Food insecurity:    Worry: Not on file    Inability: Not on file  . Transportation needs:    Medical: Not on file    Non-medical: Not on file  Tobacco Use  . Smoking status: Former Smoker    Last attempt to quit: 02/07/1993    Years  since quitting: 24.2  . Smokeless tobacco: Never Used  Substance and Sexual Activity  . Alcohol use: No  . Drug use: No  . Sexual activity: Not on file  Lifestyle  . Physical activity:    Days per week: Not on file    Minutes per session: Not on file  . Stress: Not on file  Relationships  . Social connections:    Talks on phone: Not on file    Gets together: Not on file    Attends religious service: Not on file    Active member of club or organization: Not on file    Attends meetings of clubs or organizations: Not on file    Relationship status: Not on file  . Intimate partner violence:    Fear of current or ex partner: Not on file    Emotionally abused: Not on file    Physically abused: Not on file    Forced sexual activity: Not on file  Other Topics Concern  . Not on file  Social History Narrative   Walking daily.              Epworth Sleepiness Scale = 1 (as of 04/10/15)    Family History  Problem Relation Age of Onset  . Alcohol abuse Father   . Lung cancer Father        smoker  . Kidney failure Mother        med induced  . Rheum arthritis Mother   . Depression Brother   . Heart disease Unknown        Paternal family   . Heart failure Daughter   . Lung cancer Son   . Crohn's disease Brother   . Alcohol abuse Brother   . HIV Son   . Malignant hyperthermia Neg Hx   . Colon cancer Neg Hx   . Esophageal cancer Neg Hx   . Rectal cancer Neg Hx   . Stomach cancer Neg Hx     Outpatient Encounter Medications as of 05/04/2017  Medication Sig  . ALPRAZolam (XANAX) 0.5 MG tablet TAKE 1 TABLET BY MOUTH AT BEDTIME AS NEEDED FOR ANXIETY  . aspirin 81 MG tablet Take 81 mg by mouth daily.    Marland Kitchen  Cholecalciferol (VITAMIN D) 1000 UNITS capsule Take 1,000 Units by mouth daily.    Marland Kitchen LIVALO 2 MG TABS TAKE 1 TABLET BY MOUTH AT BEDTIME.  Marland Kitchen MAGNESIUM PO Take 1 tablet by mouth daily.   . Melatonin 5 MG CAPS Take 5 mg by mouth at bedtime as needed. TAKE 1 CAPSULE AT BEDTIME   .  MILK THISTLE EXTRACT PO Take 1 capsule by mouth daily.   Marland Kitchen omeprazole (PRILOSEC) 20 MG capsule TAKE 1 CAPSULE TWICE DAILY BEFORE A MEAL  . PARoxetine (PAXIL) 20 MG tablet Take 1 tablet (20 mg total) by mouth every morning.  . polyethylene glycol (MIRALAX / GLYCOLAX) packet Take 17 g by mouth daily.  . RESTASIS 0.05 % ophthalmic emulsion Place 1 drop into both eyes 2 (two) times daily.   . traMADol (ULTRAM) 50 MG tablet TAKE 1 TABLET BY MOUTH 2 TIMES DAILY AS NEEDED  . zolpidem (AMBIEN) 5 MG tablet Take 1 tablet (5 mg total) by mouth at bedtime as needed for sleep.  Marland Kitchen azithromycin (ZITHROMAX) 250 MG tablet 2 Ttabs PO on Day 1, then one a day x 4 days.  . propranolol (INDERAL) 10 MG tablet Take 1 tablet (10 mg total) by mouth 2 (two) times daily. Ok to go up to 2 tabs BID   Facility-Administered Encounter Medications as of 05/04/2017  Medication  . 0.9 %  sodium chloride infusion          Objective:   Physical Exam  Constitutional: She is oriented to person, place, and time. She appears well-developed and well-nourished.  HENT:  Head: Normocephalic and atraumatic.  Right Ear: External ear normal.  Left Ear: External ear normal.  Nose: Nose normal.  Mouth/Throat: Oropharynx is clear and moist.  TMs and canals are clear.   Eyes: Pupils are equal, round, and reactive to light. Conjunctivae and EOM are normal.  Neck: Neck supple. No thyromegaly present.  Cardiovascular: Normal rate, regular rhythm and normal heart sounds.  Pulmonary/Chest: Effort normal and breath sounds normal. She has no wheezes.  Lymphadenopathy:    She has no cervical adenopathy.  Neurological: She is alert and oriented to person, place, and time.  Skin: Skin is warm and dry.  Psychiatric: She has a normal mood and affect.        Assessment & Plan:  Acute sinusitis -we will treat with azithromycin.  If not better in 1 week please give [name] call back.  Okay to continue symptom medic care.  Frequent HA -we  discussed options.  She would really like to have an MRI done just to rule out any type of tumor or lesion that could be causing her symptoms.  I do think it is a little unusual to have such an increase in headaches in a 77 year old female with no other known triggers to be causing this.  So I think an MRI is warranted.  Will call with results once available.  We also discussed prophylaxis.  I think she would benefit.  Certainly this could be migraines without aura.  We could start with either topiramate or with a beta-blocker.  She is willing to try beta-blocker but will deftly have given on her blood pressure since her BP tends to run a little on the lower side anyway.  We will start with 10 mg which will likely be subtherapeutic but we can at least make sure she tolerates it and then adjust the dose as needed.  Like to see her back in 1-2 months.

## 2017-05-05 ENCOUNTER — Other Ambulatory Visit: Payer: Self-pay

## 2017-05-05 DIAGNOSIS — E7849 Other hyperlipidemia: Secondary | ICD-10-CM

## 2017-05-07 ENCOUNTER — Encounter: Payer: Self-pay | Admitting: Family Medicine

## 2017-05-08 ENCOUNTER — Ambulatory Visit (HOSPITAL_COMMUNITY)
Admission: RE | Admit: 2017-05-08 | Discharge: 2017-05-08 | Disposition: A | Discharge status: Home / Self Care | Payer: PPO | Source: Ambulatory Visit | Attending: Cardiology | Admitting: Cardiology

## 2017-05-08 DIAGNOSIS — I6523 Occlusion and stenosis of bilateral carotid arteries: Secondary | ICD-10-CM | POA: Diagnosis not present

## 2017-05-08 DIAGNOSIS — E785 Hyperlipidemia, unspecified: Secondary | ICD-10-CM | POA: Diagnosis not present

## 2017-05-08 DIAGNOSIS — Z5181 Encounter for therapeutic drug level monitoring: Secondary | ICD-10-CM | POA: Diagnosis not present

## 2017-05-08 LAB — COMPREHENSIVE METABOLIC PANEL
ALT: 21 IU/L (ref 0–32)
AST: 25 IU/L (ref 0–40)
Albumin/Globulin Ratio: 1.8 (ref 1.2–2.2)
Albumin: 4.3 g/dL (ref 3.5–4.8)
Alkaline Phosphatase: 86 IU/L (ref 39–117)
BUN/Creatinine Ratio: 15 (ref 12–28)
BUN: 13 mg/dL (ref 8–27)
Bilirubin Total: 0.4 mg/dL (ref 0.0–1.2)
CO2: 25 mmol/L (ref 20–29)
Calcium: 10 mg/dL (ref 8.7–10.3)
Chloride: 100 mmol/L (ref 96–106)
Creatinine, Ser: 0.85 mg/dL (ref 0.57–1.00)
GFR calc Af Amer: 78 mL/min/{1.73_m2} (ref >59)
GFR calc non Af Amer: 67 mL/min/{1.73_m2} (ref >59)
Globulin, Total: 2.4 g/dL (ref 1.5–4.5)
Glucose: 97 mg/dL (ref 65–99)
Potassium: 4.5 mmol/L (ref 3.5–5.2)
Sodium: 139 mmol/L (ref 134–144)
Total Protein: 6.7 g/dL (ref 6.0–8.5)

## 2017-05-08 LAB — LIPID PANEL
Chol/HDL Ratio: 4.3 ratio (ref 0.0–4.4)
Cholesterol, Total: 180 mg/dL (ref 100–199)
HDL: 42 mg/dL (ref >39)
LDL Calculated: 105 mg/dL — ABNORMAL HIGH (ref 0–99)
Triglycerides: 164 mg/dL — ABNORMAL HIGH (ref 0–149)
VLDL Cholesterol Cal: 33 mg/dL (ref 5–40)

## 2017-05-10 ENCOUNTER — Telehealth: Payer: Self-pay | Admitting: Cardiovascular Disease

## 2017-05-10 DIAGNOSIS — E7849 Other hyperlipidemia: Secondary | ICD-10-CM

## 2017-05-10 DIAGNOSIS — Z79899 Other long term (current) drug therapy: Secondary | ICD-10-CM

## 2017-05-10 MED ORDER — PITAVASTATIN CALCIUM 4 MG PO TABS: 4.0000 mg | ORAL_TABLET | Freq: Every day | ORAL | 1 refills | Status: DC

## 2017-05-10 NOTE — Telephone Encounter (Signed)
Pt called in for carotid results. Told her they are still in review by the provider but we will let her know when we get them.  She also asked about her lab results. Results given along with medication changes and furture lab work mailed to pt.

## 2017-05-10 NOTE — Telephone Encounter (Signed)
°  New message  Pt verbalized that she is calling for RN for the results    05/08/2017     9:00 AM     CAROTID [[id number]]

## 2017-05-13 ENCOUNTER — Ambulatory Visit (HOSPITAL_BASED_OUTPATIENT_CLINIC_OR_DEPARTMENT_OTHER)
Admission: RE | Admit: 2017-05-13 | Discharge: 2017-05-13 | Disposition: A | Discharge status: Home / Self Care | Payer: PPO | Source: Ambulatory Visit | Attending: Family Medicine | Admitting: Family Medicine

## 2017-05-13 DIAGNOSIS — G4452 New daily persistent headache (NDPH): Secondary | ICD-10-CM | POA: Insufficient documentation

## 2017-05-13 DIAGNOSIS — G319 Degenerative disease of nervous system, unspecified: Secondary | ICD-10-CM | POA: Insufficient documentation

## 2017-05-13 MED ORDER — GADOBENATE DIMEGLUMINE 529 MG/ML IV SOLN
15.0000 mL | Freq: Once | INTRAVENOUS | Status: AC | PRN
  Administered 2017-05-13: 15 mL via INTRAVENOUS

## 2017-05-15 ENCOUNTER — Encounter: Payer: Self-pay | Admitting: Family Medicine

## 2017-05-16 ENCOUNTER — Encounter: Payer: Self-pay | Admitting: Cardiovascular Disease

## 2017-05-16 MED ORDER — PROPRANOLOL HCL 10 MG PO TABS: 10.0000 mg | ORAL_TABLET | Freq: Two times a day (BID) | ORAL | 0 refills | Status: DC

## 2017-05-16 NOTE — Addendum Note (Signed)
Addended by: Huel Cote on: 05/16/2017 10:37 AM   Modules accepted: Orders

## 2017-05-18 ENCOUNTER — Encounter: Payer: Self-pay | Admitting: Family Medicine

## 2017-05-19 ENCOUNTER — Encounter: Payer: Self-pay | Admitting: Family Medicine

## 2017-05-19 NOTE — Telephone Encounter (Signed)
See second MyChart request. No appointment required.

## 2017-05-22 DIAGNOSIS — H903 Sensorineural hearing loss, bilateral: Secondary | ICD-10-CM | POA: Diagnosis not present

## 2017-05-30 ENCOUNTER — Encounter: Payer: Self-pay | Admitting: Osteopathic Medicine

## 2017-05-30 ENCOUNTER — Ambulatory Visit (INDEPENDENT_AMBULATORY_CARE_PROVIDER_SITE_OTHER): Payer: PPO | Admitting: Osteopathic Medicine

## 2017-05-30 VITALS — BP 110/75 | HR 68 | Temp 97.7°F | Wt 188.1 lb

## 2017-05-30 DIAGNOSIS — R5382 Chronic fatigue, unspecified: Secondary | ICD-10-CM | POA: Diagnosis not present

## 2017-05-30 DIAGNOSIS — Z711 Person with feared health complaint in whom no diagnosis is made: Secondary | ICD-10-CM | POA: Diagnosis not present

## 2017-05-30 LAB — COMPLETE METABOLIC PANEL WITH GFR
AG Ratio: 1.8 (calc) (ref 1.0–2.5)
ALT: 30 U/L — ABNORMAL HIGH (ref 6–29)
AST: 29 U/L (ref 10–35)
Albumin: 4.4 g/dL (ref 3.6–5.1)
Alkaline phosphatase (APISO): 87 U/L (ref 33–130)
BUN/Creatinine Ratio: 19 (calc) (ref 6–22)
BUN: 18 mg/dL (ref 7–25)
CO2: 30 mmol/L (ref 20–32)
Calcium: 9.7 mg/dL (ref 8.6–10.4)
Chloride: 103 mmol/L (ref 98–110)
Creat: 0.96 mg/dL — ABNORMAL HIGH (ref 0.60–0.93)
GFR, Est African American: 67 mL/min/{1.73_m2} (ref >=60)
GFR, Est Non African American: 58 mL/min/{1.73_m2} — ABNORMAL LOW (ref >=60)
Globulin: 2.5 g/dL (calc) (ref 1.9–3.7)
Glucose, Bld: 85 mg/dL (ref 65–99)
Potassium: 4.7 mmol/L (ref 3.5–5.3)
Sodium: 139 mmol/L (ref 135–146)
Total Bilirubin: 0.3 mg/dL (ref 0.2–1.2)
Total Protein: 6.9 g/dL (ref 6.1–8.1)

## 2017-05-30 LAB — CBC WITH DIFFERENTIAL/PLATELET
Basophils Absolute: 32 cells/uL (ref 0–200)
Basophils Relative: 0.5 %
Eosinophils Absolute: 179 cells/uL (ref 15–500)
Eosinophils Relative: 2.8 %
HCT: 41.7 % (ref 35.0–45.0)
Hemoglobin: 14.3 g/dL (ref 11.7–15.5)
Lymphs Abs: 2483 cells/uL (ref 850–3900)
MCH: 28.2 pg (ref 27.0–33.0)
MCHC: 34.3 g/dL (ref 32.0–36.0)
MCV: 82.2 fL (ref 80.0–100.0)
MPV: 10.4 fL (ref 7.5–12.5)
Monocytes Relative: 7.5 %
Neutro Abs: 3226 cells/uL (ref 1500–7800)
Neutrophils Relative %: 50.4 %
Platelets: 253 10*3/uL (ref 140–400)
RBC: 5.07 10*6/uL (ref 3.80–5.10)
RDW: 13.7 % (ref 11.0–15.0)
Total Lymphocyte: 38.8 %
WBC mixed population: 480 cells/uL (ref 200–950)
WBC: 6.4 10*3/uL (ref 3.8–10.8)

## 2017-05-30 LAB — TSH: TSH: 3.1 mIU/L (ref 0.40–4.50)

## 2017-05-30 NOTE — Progress Notes (Signed)
HPI: Cheryl [name] is a 77 y.o. female who  has a past medical history of Anemia, Anxiety, Arthritis, Bone spur, Chronic back pain, Depression, Diverticulosis, Dizzy, Fatty liver, GERD (gastroesophageal reflux disease), Hepatitis (2012), History of bronchitis, History of colon polyps, History of shingles, Hyperlipidemia, IBS (irritable bowel syndrome), Insomnia, Internal hemorrhoids, Joint pain, Joint swelling, Lung nodules, Migraine headache, Pneumonia, Renal cyst, and Uterine cancer ([place]) (1973).  she presents to [facility] today, 05/30/17,  for chief complaint of:  Lumps Feeling drained  Lumps on abdominal wall area and L hip. Noticed a few weeks ago, no change. Non-painful. No rash. She is particularly worried given few relatives with cancer.   Fatigue for awhile now, nondescript, "I just feel like I can't get going." Has been under stress with family member's cancer, this has completed treatment, she feels like anxiety is better but she is very tired all the time.   Also recently started beta blocker for migraines which has really helped.   Stopped taking Livalo.    Past medical history, surgical history, social history and family history reviewed.  Current medication list and allergy/intolerance information reviewed.    Current Outpatient Medications on File Prior to Visit  Medication Sig Dispense Refill  . ALPRAZolam (XANAX) 0.5 MG tablet TAKE 1 TABLET BY MOUTH AT BEDTIME AS NEEDED FOR ANXIETY 15 tablet 1  . aspirin 81 MG tablet Take 81 mg by mouth daily.      . Cholecalciferol (VITAMIN D) 1000 UNITS capsule Take 1,000 Units by mouth daily.      Marland Kitchen MAGNESIUM PO Take 1 tablet by mouth daily.     . Melatonin 5 MG CAPS Take 5 mg by mouth at bedtime as needed. TAKE 1 CAPSULE AT BEDTIME     . MILK THISTLE EXTRACT PO Take 1 capsule by mouth daily.     Marland Kitchen omeprazole (PRILOSEC) 20 MG capsule TAKE 1 CAPSULE TWICE DAILY BEFORE A MEAL 180 capsule 3  .  PARoxetine (PAXIL) 20 MG tablet Take 1 tablet (20 mg total) by mouth every morning. 90 tablet 3  . Pitavastatin Calcium (LIVALO) 4 MG TABS Take 1 tablet (4 mg total) by mouth at bedtime. 90 tablet 1  . polyethylene glycol (MIRALAX / GLYCOLAX) packet Take 17 g by mouth daily.    . propranolol (INDERAL) 10 MG tablet Take 1 tablet (10 mg total) by mouth 2 (two) times daily. 180 tablet 0  . RESTASIS 0.05 % ophthalmic emulsion Place 1 drop into both eyes 2 (two) times daily.     . traMADol (ULTRAM) 50 MG tablet TAKE 1 TABLET BY MOUTH 2 TIMES DAILY AS NEEDED 180 tablet 1  . zolpidem (AMBIEN) 5 MG tablet Take 1 tablet (5 mg total) by mouth at bedtime as needed for sleep. 30 tablet 3   Current Facility-Administered Medications on File Prior to Visit  Medication Dose Route Frequency Provider Last Rate Last Dose  . 0.9 %  sodium chloride infusion  500 mL Intravenous Continuous Irene Shipper, MD       Allergies  Allergen Reactions  . Fluoxetine Hcl Shortness Of Breath and Itching    REACTION: itching,SOB  . Contrast Media [Iodinated Diagnostic Agents] Itching    Multilance.   . Dexamethasone Diarrhea and Nausea And Vomiting  . Livalo [Pitavastatin] Other (See Comments)    Myalgia   . Metrizamide Itching    Multilance.   . Rosuvastatin Other (See Comments)    bodyaches and cramping  .  Simvastatin Other (See Comments)    Muscle aches and HA  . Codeine Sulfate Itching    REACTION: itching  . Sulfasalazine Nausea Only    REACTION: Nausea  . Sulfonamide Derivatives Nausea Only    REACTION: Nausea      Review of Systems:  Constitutional: No recent illness, +fatigue  HEENT: No  headache, no vision change  Cardiac: No  chest pain, No  pressure, No palpitations  Respiratory:  No  shortness of breath. No  Cough  Gastrointestinal: No  abdominal pain, no change on bowel habits  Musculoskeletal: No new myalgia/arthralgia  Skin: No  Rash  Hem/Onc: No  easy bruising/bleeding, +abnormal  lumps/bumps  Neurologic: No  weakness, No  Dizziness  Psychiatric: No  concerns with depression, No  concerns with anxiety  Exam:  BP 110/75   Pulse 68   Temp 97.7 F (36.5 C) (Oral)   Wt 188 lb 1.6 oz (85.3 kg)   BMI 31.30 kg/m   Constitutional: VS see above. General Appearance: alert, well-developed, well-nourished, NAD  Eyes: Normal lids and conjunctive, non-icteric sclera  Ears, Nose, Mouth, Throat: MMM, Normal external inspection ears/nares/mouth/lips/gums.  Neck: No masses, trachea midline.   Respiratory: Normal respiratory effort.  Musculoskeletal: Gait normal. Symmetric and independent movement of all extremities  Neurological: Normal balance/coordination. No tremor.  Skin: warm, dry, intact. I cannot palpate any subcutaneous mass or abdominal mass, no rash or discoloration.   Psychiatric: Normal judgment/insight. Normal mood and affect. Oriented x3.     ASSESSMENT/PLAN: The primary encounter diagnosis was Chronic fatigue. A diagnosis of Physically well but worried was also pertinent to this visit.  I think she's feeling normal fatty tissue. Would get second opinion from PCP.  Basic lab w/u for fatigue, recent labs reviewed and no concerns. Consider beta blocker as cause of fatigue, too, though these adverse effects may need to be considered in light of the benefit it's giving her for migraine control    Orders Placed This Encounter  Procedures  . CBC with Differential/Platelet  . COMPLETE METABOLIC PANEL WITH GFR  . TSH  . VITAMIN D 25 Hydroxy (Vit-D Deficiency, Fractures)      Follow-up plan: Return for recheck with Dr Madilyn Fireman in 1-2 weeks - fatigue and second opinion on lumps .  Visit summary with medication list and pertinent instructions was printed for patient to review, alert [name] if any changes needed. All questions at time of visit were answered - patient instructed to contact office with any additional concerns. ER/RTC precautions were reviewed with  the patient and understanding verbalized.   Note: Total time spent 25 minutes, greater than 50% of the visit was spent face-to-face counseling and coordinating care for the following: The primary encounter diagnosis was Chronic fatigue. A diagnosis of Physically well but worried was also pertinent to this visit.Marland Kitchen  Please note: voice recognition software was used to produce this document, and typos may escape review. Please contact Dr. Sheppard Coil for any needed clarifications.

## 2017-05-31 LAB — VITAMIN D 25 HYDROXY (VIT D DEFICIENCY, FRACTURES): Vit D, 25-Hydroxy: 47 ng/mL (ref 30–100)

## 2017-06-06 ENCOUNTER — Telehealth: Payer: Self-pay | Admitting: Family Medicine

## 2017-06-06 ENCOUNTER — Ambulatory Visit (INDEPENDENT_AMBULATORY_CARE_PROVIDER_SITE_OTHER): Payer: PPO

## 2017-06-06 ENCOUNTER — Ambulatory Visit (INDEPENDENT_AMBULATORY_CARE_PROVIDER_SITE_OTHER): Payer: PPO | Admitting: Family Medicine

## 2017-06-06 ENCOUNTER — Encounter: Payer: Self-pay | Admitting: Family Medicine

## 2017-06-06 VITALS — BP 109/47 | HR 68 | Ht 65.0 in | Wt 188.0 lb

## 2017-06-06 DIAGNOSIS — R0602 Shortness of breath: Secondary | ICD-10-CM

## 2017-06-06 DIAGNOSIS — R748 Abnormal levels of other serum enzymes: Secondary | ICD-10-CM

## 2017-06-06 DIAGNOSIS — Z8701 Personal history of pneumonia (recurrent): Secondary | ICD-10-CM

## 2017-06-06 DIAGNOSIS — R5382 Chronic fatigue, unspecified: Secondary | ICD-10-CM

## 2017-06-06 DIAGNOSIS — R300 Dysuria: Secondary | ICD-10-CM | POA: Diagnosis not present

## 2017-06-06 LAB — POCT URINALYSIS DIPSTICK
Bilirubin, UA: NEGATIVE
Glucose, UA: NEGATIVE
Ketones, UA: NEGATIVE
Nitrite, UA: POSITIVE
Spec Grav, UA: 1.02 (ref 1.010–1.025)
Urobilinogen, UA: 0.2 E.U./dL
pH, UA: 7 (ref 5.0–8.0)

## 2017-06-06 LAB — HEPATIC FUNCTION PANEL
AG Ratio: 1.8 (calc) (ref 1.0–2.5)
ALT: 21 U/L (ref 6–29)
AST: 23 U/L (ref 10–35)
Albumin: 4 g/dL (ref 3.6–5.1)
Alkaline phosphatase (APISO): 77 U/L (ref 33–130)
Bilirubin, Direct: 0.1 mg/dL (ref 0.0–0.2)
Globulin: 2.2 g/dL (calc) (ref 1.9–3.7)
Indirect Bilirubin: 0.2 mg/dL (calc) (ref 0.2–1.2)
Total Bilirubin: 0.3 mg/dL (ref 0.2–1.2)
Total Protein: 6.2 g/dL (ref 6.1–8.1)

## 2017-06-06 MED ORDER — DOXYCYCLINE HYCLATE 100 MG PO TABS: 100.0000 mg | ORAL_TABLET | Freq: Two times a day (BID) | ORAL | 0 refills | Status: DC

## 2017-06-06 MED ORDER — NITROFURANTOIN MACROCRYSTAL 100 MG PO CAPS: 100.0000 mg | ORAL_CAPSULE | Freq: Two times a day (BID) | ORAL | 0 refills | Status: DC

## 2017-06-06 NOTE — Addendum Note (Signed)
Addended by: Teddy Spike on: 06/06/2017 12:19 PM   Modules accepted: Orders

## 2017-06-06 NOTE — Telephone Encounter (Signed)
Call pt: Urinary tract infection-dipstick positive for leukocytes and nitrites as well as blood.  We will go ahead and treat with nitrofurantoin.  Recommend repeat UA when I see her back in 2 weeks just to make sure that the blood has cleared up.

## 2017-06-06 NOTE — Progress Notes (Signed)
Subjective:    Patient ID: Cheryl [name], female    DOB: [birth year], 77 y.o.   MRN: [id number]  HPI 77 year old female is here today for routine follow-up.  But she did come into the office about a week ago complaining of some lumps on the abdominal wall and left hip.  No rash.  She had normally noticed them a few weeks ago.  Her daughter is currently been diagnosed with throat cancer and is undergoing treatment.  She just felt fatigued as well.  She is on a beta-blocker.  They did lab work just to rule out cause fatigue including anemia, renal and liver impairment, thyroid disorder and vitamin D deficiency.  Thyroid was normal.  Vitamin D was normal.  No anemia on the blood count.  Metabolic panel was normal as well.  Have just mild inflammation of the liver with an ALT at 30.   She also complains of tightness and shortness of breath that she is been experiencing about a month.  No cough or chest congestion per se.  No fevers chills or sweats.  The chest tightness is bilateral, right at the lower ribs radiating all the way around bilaterally.  She notices it more with activity not really at rest.  No recent upper respiratory infections.  Says it almost feels like when she had costochondritis.  She also complains of a shooting pain in the groin that shoots upward Ennever she urinates.  This started yesterday.  Again no fevers chills or sweats.  No hematuria.  Review of Systems  BP (!) 109/47   Pulse 68   Ht 5\' 5"  (1.651 m)   Wt 188 lb (85.3 kg)   SpO2 100%   BMI 31.28 kg/m     Allergies  Allergen Reactions  . Fluoxetine Hcl Shortness Of Breath and Itching    REACTION: itching,SOB  . Contrast Media [Iodinated Diagnostic Agents] Itching    Multilance.   . Dexamethasone Diarrhea and Nausea And Vomiting  . Metrizamide Itching    Multilance.   . Rosuvastatin Other (See Comments)    bodyaches and cramping  . Simvastatin Other (See Comments)    Muscle aches and HA  . Codeine Sulfate  Itching    REACTION: itching  . Sulfasalazine Nausea Only    REACTION: Nausea  . Sulfonamide Derivatives Nausea Only    REACTION: Nausea    Past Medical History:  Diagnosis Date  . Anemia   . Anxiety    d/t recent family issues  . Arthritis   . Bone spur   . Chronic back pain    stenosis and OA  . Depression    takes Paxil daily  . Diverticulosis   . Dizzy    occasionally  . Fatty liver    has had 2 Hep shots and the final one is in June 18  . GERD (gastroesophageal reflux disease)    takes Omeprazole daily  . Hepatitis 2012  . History of bronchitis    20-25 yrs ago  . History of colon polyps    precancerous  . History of shingles   . Hyperlipidemia    takes Simvastatin daily  . IBS (irritable bowel syndrome)    more on side of constipation-takes Miralax daily  . Insomnia    takes Melatonin and Ambien nightly  . Internal hemorrhoids   . Joint pain   . Joint swelling   . Lung nodules    calcified   . Migraine headache  migraines-thinks coming from neck.Last one1/28/18  . Pneumonia    hx of-20 yrs ago-=-walking  . Renal cyst    left  . Uterine cancer ([place]) 1973    Past Surgical History:  Procedure Laterality Date  . APPENDECTOMY  1966  . cataracts     bilateral  . COLONOSCOPY    . EUS  02/10/2011   Procedure: UPPER ENDOSCOPIC ULTRASOUND (EUS) RADIAL;  Surgeon: Owens Loffler, MD;  Location: [facility];  Service: Endoscopy;  Laterality: N/A;  radial linear   . RECTOCELE REPAIR  2011  . ROTATOR CUFF REPAIR  2009   left  . TONSILLECTOMY    . UPPER GASTROINTESTINAL ENDOSCOPY    . VAGINAL HYSTERECTOMY  1973    Social History   Socioeconomic History  . Marital status: Legally Separated    Spouse name: Not on file  . Number of children: 4  . Years of education: Not on file  . Highest education level: Not on file  Occupational History  . Occupation: Retired      Fish farm manager: RETIRED  Social Needs  . Financial resource strain: Not on file  . Food  insecurity:    Worry: Not on file    Inability: Not on file  . Transportation needs:    Medical: Not on file    Non-medical: Not on file  Tobacco Use  . Smoking status: Former Smoker    Last attempt to quit: 02/07/1993    Years since quitting: 24.3  . Smokeless tobacco: Never Used  Substance and Sexual Activity  . Alcohol use: No  . Drug use: No  . Sexual activity: Not on file  Lifestyle  . Physical activity:    Days per week: Not on file    Minutes per session: Not on file  . Stress: Not on file  Relationships  . Social connections:    Talks on phone: Not on file    Gets together: Not on file    Attends religious service: Not on file    Active member of club or organization: Not on file    Attends meetings of clubs or organizations: Not on file    Relationship status: Not on file  . Intimate partner violence:    Fear of current or ex partner: Not on file    Emotionally abused: Not on file    Physically abused: Not on file    Forced sexual activity: Not on file  Other Topics Concern  . Not on file  Social History Narrative   Walking daily.              Epworth Sleepiness Scale = 1 (as of 04/10/15)    Family History  Problem Relation Age of Onset  . Alcohol abuse Father   . Lung cancer Father        smoker  . Kidney failure Mother        med induced  . Rheum arthritis Mother   . Depression Brother   . Heart disease Unknown        Paternal family   . Heart failure Daughter   . Lung cancer Son   . Crohn's disease Brother   . Alcohol abuse Brother   . HIV Son   . Malignant hyperthermia Neg Hx   . Colon cancer Neg Hx   . Esophageal cancer Neg Hx   . Rectal cancer Neg Hx   . Stomach cancer Neg Hx     Outpatient Encounter Medications as of 06/06/2017  Medication Sig  . ALPRAZolam (XANAX) 0.5 MG tablet TAKE 1 TABLET BY MOUTH AT BEDTIME AS NEEDED FOR ANXIETY  . aspirin 81 MG tablet Take 81 mg by mouth daily.    . Cholecalciferol (VITAMIN D) 1000 UNITS capsule  Take 1,000 Units by mouth daily.    Marland Kitchen MAGNESIUM PO Take 1 tablet by mouth daily.   . Melatonin 5 MG CAPS Take 5 mg by mouth at bedtime as needed. TAKE 1 CAPSULE AT BEDTIME   . MILK THISTLE EXTRACT PO Take 1 capsule by mouth daily.   Marland Kitchen omeprazole (PRILOSEC) 20 MG capsule TAKE 1 CAPSULE TWICE DAILY BEFORE A MEAL  . PARoxetine (PAXIL) 20 MG tablet Take 1 tablet (20 mg total) by mouth every morning.  . Pitavastatin Calcium (LIVALO) 4 MG TABS Take 1 tablet (4 mg total) by mouth at bedtime.  . polyethylene glycol (MIRALAX / GLYCOLAX) packet Take 17 g by mouth daily.  . propranolol (INDERAL) 10 MG tablet Take 1 tablet (10 mg total) by mouth 2 (two) times daily.  . RESTASIS 0.05 % ophthalmic emulsion Place 1 drop into both eyes 2 (two) times daily.   . traMADol (ULTRAM) 50 MG tablet TAKE 1 TABLET BY MOUTH 2 TIMES DAILY AS NEEDED  . zolpidem (AMBIEN) 5 MG tablet Take 1 tablet (5 mg total) by mouth at bedtime as needed for sleep.  . nitrofurantoin (MACRODANTIN) 100 MG capsule Take 1 capsule (100 mg total) by mouth 2 (two) times daily.   No facility-administered encounter medications on file as of 06/06/2017.           Objective:   Physical Exam  Constitutional: She is oriented to person, place, and time. She appears well-developed and well-nourished.  HENT:  Head: Normocephalic and atraumatic.  Right Ear: External ear normal.  Left Ear: External ear normal.  Nose: Nose normal.  Mouth/Throat: Oropharynx is clear and moist.  TMs and canals are clear.   Eyes: Pupils are equal, round, and reactive to light. Conjunctivae and EOM are normal.  Neck: Neck supple. No thyromegaly present.  Cardiovascular: Normal rate, regular rhythm and normal heart sounds.  Pulmonary/Chest: Effort normal and breath sounds normal. She has no wheezes.  Abdominal: Soft. Bowel sounds are normal. She exhibits no distension and no mass. There is no tenderness. There is no rebound and no guarding.  Lymphadenopathy:    She  has no cervical adenopathy.  Neurological: She is alert and oriented to person, place, and time.  Skin: Skin is warm and dry.  Psychiatric: She has a normal mood and affect.        Assessment & Plan:  Fatigue-recently had labs that ruled out anemia, thyroid disorder, renal and kidney impairment.  I think some of this is somewhat emotional.  Just dealing with her daughter who is dealing with right cancer and is really struggling right now.  Headaches-have been much better on the beta-blocker.  It really made a big difference.  Elevated liver enzyme-due to recheck.  Shortness of breath/chest tightness-this is been going on for a little over a month at this point.  I think we will start with a chest x-ray.  She does have some pain limited to she has been noted before.  And schedule her for spirometry.   Urinary tract infection-dipstick positive for leukocytes and nitrites as well as blood.  We will go ahead and treat with nitrofurantoin.  Recommend repeat UA when I see her back in 2 weeks just to make sure that the  blood has cleared up.

## 2017-06-06 NOTE — Telephone Encounter (Signed)
Pt advised of results, verbalized understanding. No further questions.  

## 2017-06-07 ENCOUNTER — Encounter: Payer: Self-pay | Admitting: Family Medicine

## 2017-06-07 NOTE — Telephone Encounter (Signed)
Yes, Dr. Madilyn Fireman placed in result notes both macrobid and doxycycline. One treats UTI and the other treats Upper respiratory infection.

## 2017-06-09 ENCOUNTER — Encounter: Payer: Self-pay | Admitting: Family Medicine

## 2017-06-09 LAB — URINE CULTURE
MICRO NUMBER:: 90526052
SPECIMEN QUALITY:: ADEQUATE

## 2017-06-20 ENCOUNTER — Other Ambulatory Visit: Payer: Self-pay | Admitting: Dermatology

## 2017-06-20 DIAGNOSIS — L820 Inflamed seborrheic keratosis: Secondary | ICD-10-CM | POA: Diagnosis not present

## 2017-06-20 DIAGNOSIS — L821 Other seborrheic keratosis: Secondary | ICD-10-CM | POA: Diagnosis not present

## 2017-06-20 DIAGNOSIS — D485 Neoplasm of uncertain behavior of skin: Secondary | ICD-10-CM | POA: Diagnosis not present

## 2017-06-20 DIAGNOSIS — D229 Melanocytic nevi, unspecified: Secondary | ICD-10-CM | POA: Diagnosis not present

## 2017-06-20 DIAGNOSIS — D234 Other benign neoplasm of skin of scalp and neck: Secondary | ICD-10-CM | POA: Diagnosis not present

## 2017-06-27 ENCOUNTER — Ambulatory Visit (INDEPENDENT_AMBULATORY_CARE_PROVIDER_SITE_OTHER): Payer: PPO | Admitting: Family Medicine

## 2017-06-27 VITALS — BP 96/44 | HR 60

## 2017-06-27 DIAGNOSIS — R0602 Shortness of breath: Secondary | ICD-10-CM | POA: Diagnosis not present

## 2017-06-27 DIAGNOSIS — R05 Cough: Secondary | ICD-10-CM | POA: Diagnosis not present

## 2017-06-27 DIAGNOSIS — R059 Cough, unspecified: Secondary | ICD-10-CM

## 2017-06-27 DIAGNOSIS — I952 Hypotension due to drugs: Secondary | ICD-10-CM | POA: Diagnosis not present

## 2017-06-27 MED ORDER — ALBUTEROL SULFATE (2.5 MG/3ML) 0.083% IN NEBU
2.5000 mg | INHALATION_SOLUTION | Freq: Once | RESPIRATORY_TRACT | Status: AC
  Administered 2017-06-27: 2.5 mg via RESPIRATORY_TRACT

## 2017-06-27 NOTE — Progress Notes (Signed)
   Subjective:    Patient ID: Cheryl [name], female    DOB: April 19, 1940, 77 y.o.   MRN: [id number]  HPI 77 year old female is here today to follow-up for recent concerns and complaints about shortness of breath.  She had a chest x-ray which is showed some 5 mediastinal and hilar nodes consistent with prior granulomatous disease.  Was otherwise normal so I had her come in for spirometry today for further evaluation. She mostly notices the shortness of breath when she walks her dogs.    Blood pressures have been running a little low.  She says most the time when she goes the doctor's office it is okay but she does tend to have low blood pressure in general.  Taking propranolol but feels like it is working great for to control her headaches.  She has had just a few pounds since March of this year.  Weight is currently stable.   Review of Systems  No lightheadedness or dizziness.      Objective:   Physical Exam  Constitutional: She is oriented to person, place, and time. She appears well-developed and well-nourished.  HENT:  Head: Normocephalic and atraumatic.  Eyes: Conjunctivae and EOM are normal.  Cardiovascular: Normal rate.  Pulmonary/Chest: Effort normal.  Neurological: She is alert and oriented to person, place, and time.  Skin: Skin is dry. No pallor.  Psychiatric: She has a normal mood and affect. Her behavior is normal.  Vitals reviewed.       Assessment & Plan:  Shortness of breath-spirometry showed an FVC of 81% with an FEV1 of 91% ratio of 84%.  Essentially normal spirometry just a slight decline in [facility] which is not surprising considering that she has evidence of granulomatous disease on chest x-ray.  No significant improvement post albuterol treatment.  Reviewed results with patient and gave her reassurance.  Urged her to keep up the regular activity.  This is helpful in her sizing her lungs and helping maintain her stamina.  Certainly if she feels like her shortness of  breath is progressing we can repeat spirometry.  Otherwise recommend repeat in about 2 years.  Hypotension-blood pressure is particularly low on exam today.  She did eat and drink today and feels like she drinks a lot of water on a regular basis.  We have started her on propranolol several months ago for headaches and says it has worked really well and is quite effective.  She is asymptomatic with the low blood pressure.  We discussed options including increasing the salt in her diet and she is going to try dropping her propranolol down to 1 tab a day instead of twice daily and see if this still controls her headaches but allows her blood pressure to rise slightly.  He has a home blood pressure cuff and will keep an eye on it.

## 2017-06-27 NOTE — Patient Instructions (Signed)
Try just taking the propranolol at bedtime.

## 2017-07-31 ENCOUNTER — Ambulatory Visit (INDEPENDENT_AMBULATORY_CARE_PROVIDER_SITE_OTHER): Payer: PPO

## 2017-07-31 ENCOUNTER — Ambulatory Visit (INDEPENDENT_AMBULATORY_CARE_PROVIDER_SITE_OTHER): Payer: PPO | Admitting: Family Medicine

## 2017-07-31 ENCOUNTER — Encounter: Payer: Self-pay | Admitting: Family Medicine

## 2017-07-31 VITALS — BP 110/57 | HR 55 | Ht 65.0 in | Wt 189.0 lb

## 2017-07-31 DIAGNOSIS — M25571 Pain in right ankle and joints of right foot: Secondary | ICD-10-CM

## 2017-07-31 DIAGNOSIS — B078 Other viral warts: Secondary | ICD-10-CM

## 2017-07-31 DIAGNOSIS — L820 Inflamed seborrheic keratosis: Secondary | ICD-10-CM

## 2017-07-31 DIAGNOSIS — L989 Disorder of the skin and subcutaneous tissue, unspecified: Secondary | ICD-10-CM

## 2017-07-31 NOTE — Progress Notes (Signed)
Subjective:    Patient ID: Cheryl [name], female    DOB: 12-17-1940, 77 y.o.   MRN: [id number]  HPI 77 year old female has several lesions that she would like me to look at today.  She has one in particular on her left upper back near her shoulder.  She says it is putting on her close.  She has one on her left posterior thigh.  Is been frozen before and it just came back.  Is been there for several years.  She says she has a hard time leaving it alone and again it catches on her close.  She also has a smaller lesion over her left anterior knee.  Reports right medial heel pain just distal to the medial malleolus and lateral to the Achilles.  She does not remember any trauma or injury.  She says it started hurting initially after starting a statin.  She was experiencing pain down both legs all the way down.  She then started taking coenzyme Q 10 and that actually helped significantly with the leg pain but was residual was the pain near her ankle.  Has continued to bother her.  She says it was really quite painful on Friday but has gotten a little better since then.  Tylenol does help relieve her pain.  She says it really seems to be positional.  If she moves her foot just a certain way it is painful and sometimes with walking it is painful.   Review of Systems  BP (!) 110/57   Pulse (!) 55   Ht 5\' 5"  (1.651 m)   Wt 189 lb (85.7 kg)   SpO2 97%   BMI 31.45 kg/m     Allergies  Allergen Reactions  . Fluoxetine Hcl Shortness Of Breath and Itching    REACTION: itching,SOB  . Contrast Media [Iodinated Diagnostic Agents] Itching    Multilance.   . Dexamethasone Diarrhea and Nausea And Vomiting  . Metrizamide Itching    Multilance.   . Rosuvastatin Other (See Comments)    bodyaches and cramping  . Simvastatin Other (See Comments)    Muscle aches and HA  . Codeine Sulfate Itching    REACTION: itching  . Sulfasalazine Nausea Only    REACTION: Nausea  . Sulfonamide Derivatives Nausea Only   REACTION: Nausea    Past Medical History:  Diagnosis Date  . Anemia   . Anxiety    d/t recent family issues  . Arthritis   . Bone spur   . Chronic back pain    stenosis and OA  . Depression    takes Paxil daily  . Diverticulosis   . Dizzy    occasionally  . Fatty liver    has had 2 Hep shots and the final one is in June 18  . GERD (gastroesophageal reflux disease)    takes Omeprazole daily  . Hepatitis 2012  . History of bronchitis    20-25 yrs ago  . History of colon polyps    precancerous  . History of shingles   . Hyperlipidemia    takes Simvastatin daily  . IBS (irritable bowel syndrome)    more on side of constipation-takes Miralax daily  . Insomnia    takes Melatonin and Ambien nightly  . Internal hemorrhoids   . Joint pain   . Joint swelling   . Lung nodules    calcified   . Migraine headache    migraines-thinks coming from neck.Last one1/28/18  . Pneumonia  hx of-20 yrs ago-=-walking  . Renal cyst    left  . Uterine cancer ([place]) 1973    Past Surgical History:  Procedure Laterality Date  . APPENDECTOMY  1966  . cataracts     bilateral  . COLONOSCOPY    . EUS  02/10/2011   Procedure: UPPER ENDOSCOPIC ULTRASOUND (EUS) RADIAL;  Surgeon: Owens Loffler, MD;  Location: [facility];  Service: Endoscopy;  Laterality: N/A;  radial linear   . RECTOCELE REPAIR  2011  . ROTATOR CUFF REPAIR  2009   left  . TONSILLECTOMY    . UPPER GASTROINTESTINAL ENDOSCOPY    . VAGINAL HYSTERECTOMY  1973    Social History   Socioeconomic History  . Marital status: Legally Separated    Spouse name: Not on file  . Number of children: 4  . Years of education: Not on file  . Highest education level: Not on file  Occupational History  . Occupation: Retired      Fish farm manager: RETIRED  Social Needs  . Financial resource strain: Not on file  . Food insecurity:    Worry: Not on file    Inability: Not on file  . Transportation needs:    Medical: Not on file     Non-medical: Not on file  Tobacco Use  . Smoking status: Former Smoker    Last attempt to quit: 02/07/1993    Years since quitting: 24.4  . Smokeless tobacco: Never Used  Substance and Sexual Activity  . Alcohol use: No  . Drug use: No  . Sexual activity: Not on file  Lifestyle  . Physical activity:    Days per week: Not on file    Minutes per session: Not on file  . Stress: Not on file  Relationships  . Social connections:    Talks on phone: Not on file    Gets together: Not on file    Attends religious service: Not on file    Active member of club or organization: Not on file    Attends meetings of clubs or organizations: Not on file    Relationship status: Not on file  . Intimate partner violence:    Fear of current or ex partner: Not on file    Emotionally abused: Not on file    Physically abused: Not on file    Forced sexual activity: Not on file  Other Topics Concern  . Not on file  Social History Narrative   Walking daily.              Epworth Sleepiness Scale = 1 (as of 04/10/15)    Family History  Problem Relation Age of Onset  . Alcohol abuse Father   . Lung cancer Father        smoker  . Kidney failure Mother        med induced  . Rheum arthritis Mother   . Depression Brother   . Heart disease Unknown        Paternal family   . Heart failure Daughter   . Lung cancer Son   . Crohn's disease Brother   . Alcohol abuse Brother   . HIV Son   . Malignant hyperthermia Neg Hx   . Colon cancer Neg Hx   . Esophageal cancer Neg Hx   . Rectal cancer Neg Hx   . Stomach cancer Neg Hx     Outpatient Encounter Medications as of 07/31/2017  Medication Sig  . ALPRAZolam (XANAX) 0.5 MG tablet TAKE 1  TABLET BY MOUTH AT BEDTIME AS NEEDED FOR ANXIETY  . aspirin 81 MG tablet Take 81 mg by mouth daily.    . Cholecalciferol (VITAMIN D) 1000 UNITS capsule Take 1,000 Units by mouth daily.    . fluticasone (FLONASE) 50 MCG/ACT nasal spray SPRAY 2 SPRAYS INTO EACH NOSTRIL  EVERY DAY  . MAGNESIUM PO Take 1 tablet by mouth daily.   . Melatonin 5 MG CAPS Take 5 mg by mouth at bedtime as needed. TAKE 1 CAPSULE AT BEDTIME   . MILK THISTLE EXTRACT PO Take 1 capsule by mouth daily.   Marland Kitchen omeprazole (PRILOSEC) 20 MG capsule TAKE 1 CAPSULE TWICE DAILY BEFORE A MEAL  . PARoxetine (PAXIL) 20 MG tablet Take 1 tablet (20 mg total) by mouth every morning.  . Pitavastatin Calcium (LIVALO) 4 MG TABS Take 1 tablet (4 mg total) by mouth at bedtime.  . polyethylene glycol (MIRALAX / GLYCOLAX) packet Take 17 g by mouth daily.  . propranolol (INDERAL) 10 MG tablet Take 1 tablet (10 mg total) by mouth 2 (two) times daily.  . RESTASIS 0.05 % ophthalmic emulsion Place 1 drop into both eyes 2 (two) times daily.   . traMADol (ULTRAM) 50 MG tablet TAKE 1 TABLET BY MOUTH 2 TIMES DAILY AS NEEDED  . zolpidem (AMBIEN) 5 MG tablet Take 1 tablet (5 mg total) by mouth at bedtime as needed for sleep.   No facility-administered encounter medications on file as of 07/31/2017.          Objective:   Physical Exam  Constitutional: She is oriented to person, place, and time. She appears well-developed and well-nourished.  HENT:  Head: Normocephalic and atraumatic.  Eyes: Conjunctivae and EOM are normal.  Cardiovascular: Normal rate.  Pulmonary/Chest: Effort normal.  Musculoskeletal:  Right ankle with normal flexion extension inversion and eversion.  Strength is 5 out of 5 in all directions.  No swelling or erythema.  No increased laxity.  Mildly tender just distal to the medial malleolus and lateral to the Achilles tendon.  Nontender over the Achilles tendon.  Neurological: She is alert and oriented to person, place, and time.  Skin: Skin is dry. No pallor.  Left upper back near her shoulder is consistent with a seborrheic keratosis with a very hyperkeratotic center.  But she admits she is been picking at it.  Lesion on left posterior thigh is dry and scaling and papular and a dark brown color.   Lesion on her left anterior knee is most consistent with a wart.  It is perfectly round and quite raised with a irregular textured surface.  Psychiatric: She has a normal mood and affect. Her behavior is normal.  Vitals reviewed.        Assessment & Plan:  Seborrheic keratoses-lesion on left upper back and left posterior thigh are most consistent with seborrheic keratoses though the one on her upper back is more hyperkeratotic.  Recommend shave biopsy of the one on her thigh.  Initially recommended biopsy of the one on her shoulder but she preferred to do cryotherapy on that one since it had never been treated with cryotherapy.  Wart-lesion on left anterior knee. Treated with cryotherapy.    Cryotherapy Procedure Note  Pre-operative Diagnosis: Seborrheic keratosis on the left upper shoulder, wart on the left knee  Post-operative Diagnosis: same  Locations: Seborrheic keratosis on the left upper shoulder, wart on the left knee  Indications: irritation, catching on clothing  Anesthesia: not required without added sodium bicarbonate  Procedure Details  Patient informed of risks (permanent scarring, infection, light or dark discoloration, bleeding, infection, weakness, numbness and recurrence of the lesion) and benefits of the procedure and verbal informed consent obtained.  The areas are treated with liquid nitrogen therapy, frozen until ice ball extended 1-2 mm beyond lesion, allowed to thaw, and treated again. The patient tolerated procedure well.  The patient was instructed on post-op care, warned that there may be blister formation, redness and pain. Recommend OTC analgesia as needed for pain.  Condition: Stable  Complications: none.  Plan: 1. Instructed to keep the area dry and covered for 24-48h and clean thereafter. 2. Warning signs of infection were reviewed.   3. Recommended that the patient use OTC acetaminophen as needed for pain.  4. Return PRN.   Shave Biopsy  Procedure Note  Pre-operative Diagnosis: Suspicious lesion  Post-operative Diagnosis: same  Locations:left posterior thigh  Indications: irritation, getting larger  Anesthesia: not required    Procedure Details  Patient informed of the risks (including bleeding and infection) and benefits of the  procedure and Verbal informed consent obtained.  The lesion and surrounding area were given a sterile prep using chlorhexidine and draped in the usual sterile fashion. A scalpel was used to shave an area of skin approximately 1cm by 1cm.  Hemostasis achieved with alumuninum chloride. Antibiotic ointment and a sterile dressing applied.  The specimen was sent for pathologic examination. The patient tolerated the procedure well.  EBL: trace  Findings: Await pathology  Condition: Stable  Complications: none.  Plan: 1. Instructed to keep the wound dry and covered for 24-48h and clean thereafter. 2. Warning signs of infection were reviewed.   3. Recommended that the patient use OTC acetaminophen as needed for pain.  4. Return PRN.   Right ankle pain, medial -unclear etiology.  She does not remember any trauma or twisting of the ankle.  It started around the time she started her statin.  I think we should get x-ray since is been going on for several weeks at this point.  Tylenol does seem to help which is reassuring.  She has not changed shoewear recently that would have aggravated it.  She is nontender over the Achilles tendon.

## 2017-08-12 ENCOUNTER — Other Ambulatory Visit: Payer: Self-pay | Admitting: Family Medicine

## 2017-08-14 DIAGNOSIS — E7849 Other hyperlipidemia: Secondary | ICD-10-CM | POA: Diagnosis not present

## 2017-08-14 DIAGNOSIS — Z79899 Other long term (current) drug therapy: Secondary | ICD-10-CM | POA: Diagnosis not present

## 2017-08-14 LAB — COMPREHENSIVE METABOLIC PANEL
ALT: 23 IU/L (ref 0–32)
AST: 24 IU/L (ref 0–40)
Albumin/Globulin Ratio: 1.5 (ref 1.2–2.2)
Albumin: 4.1 g/dL (ref 3.5–4.8)
Alkaline Phosphatase: 98 IU/L (ref 39–117)
BUN/Creatinine Ratio: 15 (ref 12–28)
BUN: 14 mg/dL (ref 8–27)
Bilirubin Total: 0.2 mg/dL (ref 0.0–1.2)
CO2: 23 mmol/L (ref 20–29)
Calcium: 9.7 mg/dL (ref 8.7–10.3)
Chloride: 102 mmol/L (ref 96–106)
Creatinine, Ser: 0.93 mg/dL (ref 0.57–1.00)
GFR calc Af Amer: 70 mL/min/{1.73_m2} (ref >59)
GFR calc non Af Amer: 60 mL/min/{1.73_m2} (ref >59)
Globulin, Total: 2.7 g/dL (ref 1.5–4.5)
Glucose: 114 mg/dL — ABNORMAL HIGH (ref 65–99)
Potassium: 4.9 mmol/L (ref 3.5–5.2)
Sodium: 140 mmol/L (ref 134–144)
Total Protein: 6.8 g/dL (ref 6.0–8.5)

## 2017-08-14 LAB — LIPID PANEL
Chol/HDL Ratio: 4 ratio (ref 0.0–4.4)
Cholesterol, Total: 174 mg/dL (ref 100–199)
HDL: 44 mg/dL (ref >39)
LDL Calculated: 99 mg/dL (ref 0–99)
Triglycerides: 155 mg/dL — ABNORMAL HIGH (ref 0–149)
VLDL Cholesterol Cal: 31 mg/dL (ref 5–40)

## 2017-08-19 ENCOUNTER — Other Ambulatory Visit: Payer: Self-pay | Admitting: Family Medicine

## 2017-08-21 NOTE — Telephone Encounter (Signed)
[facility] requesting RF on Zolpidem  Last RX sent 01-06-17 for #30 with 3 RF  RX pended. Please send if appropriate

## 2017-09-12 ENCOUNTER — Ambulatory Visit (INDEPENDENT_AMBULATORY_CARE_PROVIDER_SITE_OTHER): Payer: PPO | Admitting: Sports Medicine

## 2017-09-12 ENCOUNTER — Encounter: Payer: Self-pay | Admitting: Sports Medicine

## 2017-09-12 DIAGNOSIS — M7661 Achilles tendinitis, right leg: Secondary | ICD-10-CM | POA: Diagnosis not present

## 2017-09-12 DIAGNOSIS — B078 Other viral warts: Secondary | ICD-10-CM | POA: Diagnosis not present

## 2017-09-12 DIAGNOSIS — M79671 Pain in right foot: Secondary | ICD-10-CM | POA: Insufficient documentation

## 2017-09-12 DIAGNOSIS — B079 Viral wart, unspecified: Secondary | ICD-10-CM | POA: Insufficient documentation

## 2017-09-12 MED ORDER — NITROGLYCERIN 0.2 MG/HR TD PT24: MEDICATED_PATCH | TRANSDERMAL | 11 refills | Status: DC

## 2017-09-12 NOTE — Assessment & Plan Note (Signed)
Repeat cryo, left knee.

## 2017-09-12 NOTE — Assessment & Plan Note (Signed)
Return for custom orthotics with heel lifts. Topical nitroglycerin patch. Rehab exercises given.

## 2017-09-12 NOTE — Patient Instructions (Signed)
Begin with easy walking, heel, toe and backwards  Do calf raises on a step:  First lower and then raise on 1 foot  If this is painful, lower on 1 foot, do the heel raise on both feet Begin with 3 sets of 10 repetitions  Increase by 5 repetitions every 3 days  Goal is 3 sets of 30 repetitions  Do with both straight knee and knee at 20 degrees of flexion  If pain persists, once you can do 3 sets of 30 without weight, add backpack with 5 lbs.  Increase by 5 lbs per week to max of 30 lbs for 3 sets of 15   

## 2017-09-12 NOTE — Progress Notes (Signed)
Subjective:    CC: Right ankle pain  HPI: For 2 months this pleasant 77 year old female has had pain that she localizes on the posterior aspect of her right ankle, worse with weightbearing.  Moderate, persistent, no radiation.  She did have x-rays that showed some of these idiopathic changes at the Achilles insertion.  I reviewed the past medical history, family history, social history, surgical history, and allergies today and no changes were needed.  Please see the problem list section below in epic for further details.  Past Medical History: Past Medical History:  Diagnosis Date  . Anemia   . Anxiety    d/t recent family issues  . Arthritis   . Bone spur   . Chronic back pain    stenosis and OA  . Depression    takes Paxil daily  . Diverticulosis   . Dizzy    occasionally  . Fatty liver    has had 2 Hep shots and the final one is in June 18  . GERD (gastroesophageal reflux disease)    takes Omeprazole daily  . Hepatitis 2012  . History of bronchitis    20-25 yrs ago  . History of colon polyps    precancerous  . History of shingles   . Hyperlipidemia    takes Simvastatin daily  . IBS (irritable bowel syndrome)    more on side of constipation-takes Miralax daily  . Insomnia    takes Melatonin and Ambien nightly  . Internal hemorrhoids   . Joint pain   . Joint swelling   . Lung nodules    calcified   . Migraine headache    migraines-thinks coming from neck.Last one1/28/18  . Pneumonia    hx of-20 yrs ago-=-walking  . Renal cyst    left  . Uterine cancer ([place]) 1973   Past Surgical History: Past Surgical History:  Procedure Laterality Date  . APPENDECTOMY  1966  . cataracts     bilateral  . COLONOSCOPY    . EUS  02/10/2011   Procedure: UPPER ENDOSCOPIC ULTRASOUND (EUS) RADIAL;  Surgeon: Owens Loffler, MD;  Location: [facility];  Service: Endoscopy;  Laterality: N/A;  radial linear   . RECTOCELE REPAIR  2011  . ROTATOR CUFF REPAIR  2009   left  .  TONSILLECTOMY    . UPPER GASTROINTESTINAL ENDOSCOPY    . VAGINAL HYSTERECTOMY  1973   Social History: Social History   Socioeconomic History  . Marital status: Legally Separated    Spouse name: Not on file  . Number of children: 4  . Years of education: Not on file  . Highest education level: Not on file  Occupational History  . Occupation: Retired      Fish farm manager: RETIRED  Social Needs  . Financial resource strain: Not on file  . Food insecurity:    Worry: Not on file    Inability: Not on file  . Transportation needs:    Medical: Not on file    Non-medical: Not on file  Tobacco Use  . Smoking status: Former Smoker    Last attempt to quit: 02/07/1993    Years since quitting: 24.6  . Smokeless tobacco: Never Used  Substance and Sexual Activity  . Alcohol use: No  . Drug use: No  . Sexual activity: Not on file  Lifestyle  . Physical activity:    Days per week: Not on file    Minutes per session: Not on file  . Stress: Not on file  Relationships  .  Social connections:    Talks on phone: Not on file    Gets together: Not on file    Attends religious service: Not on file    Active member of club or organization: Not on file    Attends meetings of clubs or organizations: Not on file    Relationship status: Not on file  Other Topics Concern  . Not on file  Social History Narrative   Walking daily.              Epworth Sleepiness Scale = 1 (as of 04/10/15)   Family History: Family History  Problem Relation Age of Onset  . Alcohol abuse Father   . Lung cancer Father        smoker  . Kidney failure Mother        med induced  . Rheum arthritis Mother   . Depression Brother   . Heart disease Unknown        Paternal family   . Heart failure Daughter   . Lung cancer Son   . Crohn's disease Brother   . Alcohol abuse Brother   . HIV Son   . Malignant hyperthermia Neg Hx   . Colon cancer Neg Hx   . Esophageal cancer Neg Hx   . Rectal cancer Neg Hx   . Stomach  cancer Neg Hx    Allergies: Allergies  Allergen Reactions  . Fluoxetine Hcl Shortness Of Breath and Itching    REACTION: itching,SOB  . Contrast Media [Iodinated Diagnostic Agents] Itching    Multilance.   . Dexamethasone Diarrhea and Nausea And Vomiting  . Metrizamide Itching    Multilance.   . Rosuvastatin Other (See Comments)    bodyaches and cramping  . Simvastatin Other (See Comments)    Muscle aches and HA  . Codeine Sulfate Itching    REACTION: itching  . Sulfasalazine Nausea Only    REACTION: Nausea  . Sulfonamide Derivatives Nausea Only    REACTION: Nausea   Medications: See med rec.  Review of Systems: No fevers, chills, night sweats, weight loss, chest pain, or shortness of breath.   Objective:    General: Well Developed, well nourished, and in no acute distress.  Neuro: Alert and oriented x3, extra-ocular muscles intact, sensation grossly intact.  HEENT: Normocephalic, atraumatic, pupils equal round reactive to light, neck supple, no masses, no lymphadenopathy, thyroid nonpalpable.  Skin: Warm and dry, no rashes. Cardiac: Regular rate and rhythm, no murmurs rubs or gallops, no lower extremity edema.  Respiratory: Clear to auscultation bilaterally. Not using accessory muscles, speaking in full sentences. Right ankle: No visible erythema or swelling. Range of motion is full in all directions. Strength is 5/5 in all directions. Stable lateral and medial ligaments; squeeze test and kleiger test unremarkable; Talar dome nontender; Minimal tenderness at the Achilles insertion No pain at base of 5th MT; No tenderness over cuboid; No tenderness over N spot or navicular prominence No tenderness on posterior aspects of lateral and medial malleolus No sign of peroneal tendon subluxations; Negative tarsal tunnel tinel's Able to walk 4 steps.  Procedure:  Cryodestruction of left knee verruca Consent obtained and verified. Time-out conducted. Noted no overlying  erythema, induration, or other signs of local infection. Completed without difficulty using Cryo-Gun. Advised to call if fevers/chills, erythema, induration, drainage, or persistent bleeding.  Impression and Recommendations:    Right insertional Achilles tendinopathy Return for custom orthotics with heel lifts. Topical nitroglycerin patch. Rehab exercises given.   Verruca Repeat  cryo, left knee. ___________________________________________ Gwen Her. Dianah Field, M.D., ABFM., CAQSM. Primary Care and [place] Instructor of [place] [employer] Medicine

## 2017-09-21 ENCOUNTER — Ambulatory Visit (INDEPENDENT_AMBULATORY_CARE_PROVIDER_SITE_OTHER): Payer: PPO | Admitting: Sports Medicine

## 2017-09-21 ENCOUNTER — Encounter: Payer: Self-pay | Admitting: Sports Medicine

## 2017-09-21 VITALS — BP 101/68 | HR 67 | Ht 65.0 in | Wt 192.0 lb

## 2017-09-21 DIAGNOSIS — Z23 Encounter for immunization: Secondary | ICD-10-CM

## 2017-09-21 DIAGNOSIS — M7661 Achilles tendinitis, right leg: Secondary | ICD-10-CM

## 2017-09-21 NOTE — Assessment & Plan Note (Addendum)
Nitroglycerin patches for 1 more week at a quarter patch, if persistent headaches can drop to 1/8 of a patch. Custom orthotics with heel lifts today.   Continue rehab exercises and return to see me in 1 month.

## 2017-09-21 NOTE — Progress Notes (Signed)
    Patient was fitted for a : standard, cushioned, semi-rigid orthotic. The orthotic was heated and afterward the patient stood on the orthotic blank positioned on the orthotic stand. The patient was positioned in subtalar neutral position and 10 degrees of ankle dorsiflexion in a weight bearing stance. After completion of molding, a stable base was applied to the orthotic blank. The blank was ground to a stable position for weight bearing. Size: 8 Base: White EVA Additional Posting and Padding: None The patient ambulated these, and they were very comfortable.  I spent 40 minutes with this patient, greater than 50% was face-to-face time counseling regarding the below diagnosis.  ___________________________________________ Evan Osburn J. Jahanna Raether, M.D., ABFM., CAQSM. Primary Care and Sports Medicine [facility]  Adjunct Instructor of Family Medicine  [employer]   

## 2017-09-21 NOTE — Addendum Note (Signed)
Addended by: Beatris Ship L on: 09/21/2017 11:36 AM   Modules accepted: Orders

## 2017-09-26 ENCOUNTER — Ambulatory Visit (INDEPENDENT_AMBULATORY_CARE_PROVIDER_SITE_OTHER): Payer: PPO | Admitting: Family Medicine

## 2017-09-26 ENCOUNTER — Encounter: Payer: Self-pay | Admitting: Family Medicine

## 2017-09-26 VITALS — BP 142/84 | HR 75 | Temp 98.8°F | Ht 65.0 in | Wt 192.0 lb

## 2017-09-26 DIAGNOSIS — R030 Elevated blood-pressure reading, without diagnosis of hypertension: Secondary | ICD-10-CM

## 2017-09-26 DIAGNOSIS — R232 Flushing: Secondary | ICD-10-CM | POA: Diagnosis not present

## 2017-09-26 DIAGNOSIS — R51 Headache: Secondary | ICD-10-CM | POA: Diagnosis not present

## 2017-09-26 DIAGNOSIS — R42 Dizziness and giddiness: Secondary | ICD-10-CM

## 2017-09-26 DIAGNOSIS — R519 Headache, unspecified: Secondary | ICD-10-CM

## 2017-09-26 DIAGNOSIS — R410 Disorientation, unspecified: Secondary | ICD-10-CM | POA: Diagnosis not present

## 2017-09-26 DIAGNOSIS — R110 Nausea: Secondary | ICD-10-CM | POA: Diagnosis not present

## 2017-09-26 NOTE — Progress Notes (Signed)
Subjective:    Patient ID: Cheryl [name], female    DOB: [birth year], 77 y.o.   MRN: [id number]  HPI 77 year old female comes in today because she just does not feel well in general.  She was actually seen for right insertional Achilles tendinopathy on August 6 with 1 of my partners, Dr. Dianah Field.  Started on a nitroglycerin patch but more recently has started feeling dizzy, nauseated.  She is been having headaches and hot flashes.  She is hearing a whooshing in her ears and in her head when she turns her head.  In general she just feels more confused.  His daughter is with her here today for the appointment.  Her daughter says that she woke up and was confused just for a couple of minutes.  She recognized her dog but did not know where she was.  It really only lasted a minute or 2 and then she seemed to be able to focus.  She is feeling very anxious and tearful today.  She did stop her statin in mid July.  She was taking 2 mg daily and the cardiologist really want her to go up to 4 mg based on her lipid levels but she just could not tolerate it.  So she did stop it.  She is been off of it for about 4 weeks now.  He is just worried about having to push her dose.  Every time she tries to increase her dose she just does not feel well.    She also reports her home blood pressures have running higher over the last week.  In the 140s.  Normally she actually has low blood pressure her last creatinine was normal at 0.9 back in July.  She is not currently on any medications for her blood pressure.  Review of Systems   No CP or SOB. No fever.  She is feeling flushed.    BP (!) 142/84   Pulse 75   Temp 98.8 F (37.1 C)   Ht 5\' 5"  (1.651 m)   Wt 192 lb (87.1 kg)   SpO2 100%   BMI 31.95 kg/m     Allergies  Allergen Reactions  . Fluoxetine Hcl Shortness Of Breath and Itching    REACTION: itching,SOB  . Contrast Media [Iodinated Diagnostic Agents] Itching    Multilance.   . Dexamethasone  Diarrhea and Nausea And Vomiting  . Metrizamide Itching    Multilance.   . Nitroglycerin     Nausea,dizziness,sweats  . Rosuvastatin Other (See Comments)    bodyaches and cramping  . Simvastatin Other (See Comments)    Muscle aches and HA  . Codeine Sulfate Itching    REACTION: itching  . Sulfasalazine Nausea Only    REACTION: Nausea  . Sulfonamide Derivatives Nausea Only    REACTION: Nausea    Past Medical History:  Diagnosis Date  . Anemia   . Anxiety    d/t recent family issues  . Arthritis   . Bone spur   . Chronic back pain    stenosis and OA  . Depression    takes Paxil daily  . Diverticulosis   . Dizzy    occasionally  . Fatty liver    has had 2 Hep shots and the final one is in June 18  . GERD (gastroesophageal reflux disease)    takes Omeprazole daily  . Hepatitis 2012  . History of bronchitis    20-25 yrs ago  . History of colon polyps  precancerous  . History of shingles   . Hyperlipidemia    takes Simvastatin daily  . IBS (irritable bowel syndrome)    more on side of constipation-takes Miralax daily  . Insomnia    takes Melatonin and Ambien nightly  . Internal hemorrhoids   . Joint pain   . Joint swelling   . Lung nodules    calcified   . Migraine headache    migraines-thinks coming from neck.Last one1/28/18  . Pneumonia    hx of-20 yrs ago-=-walking  . Renal cyst    left  . Uterine cancer ([place]) 1973    Past Surgical History:  Procedure Laterality Date  . APPENDECTOMY  1966  . cataracts     bilateral  . COLONOSCOPY    . EUS  02/10/2011   Procedure: UPPER ENDOSCOPIC ULTRASOUND (EUS) RADIAL;  Surgeon: Owens Loffler, MD;  Location: [facility];  Service: Endoscopy;  Laterality: N/A;  radial linear   . RECTOCELE REPAIR  2011  . ROTATOR CUFF REPAIR  2009   left  . TONSILLECTOMY    . UPPER GASTROINTESTINAL ENDOSCOPY    . VAGINAL HYSTERECTOMY  1973    Social History   Socioeconomic History  . Marital status: Legally Separated     Spouse name: Not on file  . Number of children: 4  . Years of education: Not on file  . Highest education level: Not on file  Occupational History  . Occupation: Retired      Fish farm manager: RETIRED  Social Needs  . Financial resource strain: Not on file  . Food insecurity:    Worry: Not on file    Inability: Not on file  . Transportation needs:    Medical: Not on file    Non-medical: Not on file  Tobacco Use  . Smoking status: Former Smoker    Last attempt to quit: 02/07/1993    Years since quitting: 24.6  . Smokeless tobacco: Never Used  Substance and Sexual Activity  . Alcohol use: No  . Drug use: No  . Sexual activity: Not on file  Lifestyle  . Physical activity:    Days per week: Not on file    Minutes per session: Not on file  . Stress: Not on file  Relationships  . Social connections:    Talks on phone: Not on file    Gets together: Not on file    Attends religious service: Not on file    Active member of club or organization: Not on file    Attends meetings of clubs or organizations: Not on file    Relationship status: Not on file  . Intimate partner violence:    Fear of current or ex partner: Not on file    Emotionally abused: Not on file    Physically abused: Not on file    Forced sexual activity: Not on file  Other Topics Concern  . Not on file  Social History Narrative   Walking daily.              Epworth Sleepiness Scale = 1 (as of 04/10/15)    Family History  Problem Relation Age of Onset  . Alcohol abuse Father   . Lung cancer Father        smoker  . Kidney failure Mother        med induced  . Rheum arthritis Mother   . Depression Brother   . Heart disease Unknown        Paternal family   .  Heart failure Daughter   . Lung cancer Son   . Crohn's disease Brother   . Alcohol abuse Brother   . HIV Son   . Malignant hyperthermia Neg Hx   . Colon cancer Neg Hx   . Esophageal cancer Neg Hx   . Rectal cancer Neg Hx   . Stomach cancer Neg Hx      Outpatient Encounter Medications as of 09/26/2017  Medication Sig  . ALPRAZolam (XANAX) 0.5 MG tablet TAKE 1 TABLET BY MOUTH AT BEDTIME AS NEEDED FOR ANXIETY  . aspirin 81 MG tablet Take 81 mg by mouth daily.    . Cholecalciferol (VITAMIN D) 1000 UNITS capsule Take 1,000 Units by mouth daily.    . fluticasone (FLONASE) 50 MCG/ACT nasal spray SPRAY 2 SPRAYS INTO EACH NOSTRIL EVERY DAY  . MAGNESIUM PO Take 1 tablet by mouth daily.   . Melatonin 5 MG CAPS Take 5 mg by mouth at bedtime as needed. TAKE 1 CAPSULE AT BEDTIME   . MILK THISTLE EXTRACT PO Take 1 capsule by mouth daily.   Marland Kitchen omeprazole (PRILOSEC) 20 MG capsule TAKE 1 CAPSULE TWICE DAILY BEFORE A MEAL  . PARoxetine (PAXIL) 20 MG tablet Take 1 tablet (20 mg total) by mouth every morning.  . Pitavastatin Calcium (LIVALO) 4 MG TABS Take 1 tablet (4 mg total) by mouth at bedtime.  . polyethylene glycol (MIRALAX / GLYCOLAX) packet Take 17 g by mouth daily.  . propranolol (INDERAL) 10 MG tablet TAKE 1 TABLET BY MOUTH TWICE A DAY  . RESTASIS 0.05 % ophthalmic emulsion Place 1 drop into both eyes 2 (two) times daily.   . traMADol (ULTRAM) 50 MG tablet TAKE 1 TABLET BY MOUTH 2 TIMES DAILY AS NEEDED  . zolpidem (AMBIEN) 5 MG tablet TAKE 1 TABLET BY MOUTH AT BEDTIME AS NEEDED FOR SLEEP  . [DISCONTINUED] nitroGLYCERIN (NITRODUR - DOSED IN MG/24 HR) 0.2 mg/hr patch Cut and apply 1/4 patch to most painful area q24h.   No facility-administered encounter medications on file as of 09/26/2017.          Objective:   Physical Exam  Constitutional: She is oriented to person, place, and time. She appears well-developed and well-nourished.  HENT:  Head: Normocephalic and atraumatic.  Right Ear: External ear normal.  Left Ear: External ear normal.  Nose: Nose normal.  Mouth/Throat: Oropharynx is clear and moist.  Eyes: Pupils are equal, round, and reactive to light. Conjunctivae and EOM are normal.  Neck: Neck supple. No thyromegaly present.   Cardiovascular: Normal rate, regular rhythm and normal heart sounds.  Pulmonary/Chest: Effort normal and breath sounds normal. She has no wheezes.  Lymphadenopathy:    She has no cervical adenopathy.  Neurological: She is alert and oriented to person, place, and time.  Skin: Skin is warm and dry.  Psychiatric: She has a normal mood and affect.        Assessment & Plan:  Medication side effect-I do suspect that the nitroglycerin patch is actually causing a lot of her symptoms including the dizziness, nausea, and headaches.  It should not really be causing the confusion so that may be a separate issue.  We did go ahead and remove her nitroglycerin patch here in the office today.  I explained that the half-life of the drug is actually very short and so she should be feeling better within a couple days.  If she is not or certain symptoms persist and others resolved and I encouraged her to give [name]  call back in a couple days and let us know in case we need to do an additional work-up.  Acute confusion-it does seem to be temporary though her daughter says she is noticing it more frequently.  Consider that she could be having mini strokes.  Though she is already taking aspirin daily.  And has been attempting to take a statin though has been off for about the last 4 weeks.  We will see if this improves as well after she comes off the nitroglycerin since it seemed to get acutely worse since starting the patch.  Elevated blood pressure-her blood pressure is a little elevated today which is unusual for her and per her daughter it has been running high at home over the last week.  Normally she has a low blood pressure in fact.  We will do some additional blood work today just to look for any type of renal disease or problems as well as recheck her thyroid level.  She did have some lab work done about 10 weeks ago which was fairly normal overall.  Some of her symptoms including the dizziness nausea and headaches  and hot flashes.  Bilateral carotid artery stenosis-regards to the statin I think she just can have to be able to take the dose that she is actually able to tolerate.  If she cannot take 4 mg and she just cannot take 4 mg then I would encourage her to stick with the 2 mg.  Right now I just want her to stay off of it for an additional week just to make sure that her symptoms are resolving.  But if she is feeling better in about 1 to 2 weeks and okay to restart the L[place]o.  Also continue to work on really healthy clean diet as well as regular exercise.  I am spent 40 minutes, grade 50% of time spent counseling about confusion, elevated blood pressure, medication, nausea, dizziness, headaches.

## 2017-09-26 NOTE — Patient Instructions (Signed)
Discontinue the nitroglycerin.  Please send me a note via my chart tomorrow and let me know how you are feeling. Call you with the lab results tomorrow as well. Continue to hold her statin for now.  If you are feeling better in a week or 2 then okay to restart it 2 mg daily.

## 2017-09-27 ENCOUNTER — Telehealth: Payer: Self-pay | Admitting: *Deleted

## 2017-09-27 ENCOUNTER — Encounter: Payer: Self-pay | Admitting: Family Medicine

## 2017-09-27 DIAGNOSIS — R4586 Emotional lability: Secondary | ICD-10-CM

## 2017-09-27 DIAGNOSIS — N912 Amenorrhea, unspecified: Secondary | ICD-10-CM

## 2017-09-27 LAB — CBC WITH DIFFERENTIAL/PLATELET
Basophils Absolute: 43 cells/uL (ref 0–200)
Basophils Relative: 0.6 %
Eosinophils Absolute: 149 cells/uL (ref 15–500)
Eosinophils Relative: 2.1 %
HCT: 46.5 % — ABNORMAL HIGH (ref 35.0–45.0)
Hemoglobin: 15.3 g/dL (ref 11.7–15.5)
Lymphs Abs: 2478 cells/uL (ref 850–3900)
MCH: 27.7 pg (ref 27.0–33.0)
MCHC: 32.9 g/dL (ref 32.0–36.0)
MCV: 84.1 fL (ref 80.0–100.0)
MPV: 10.1 fL (ref 7.5–12.5)
Monocytes Relative: 7.2 %
Neutro Abs: 3919 cells/uL (ref 1500–7800)
Neutrophils Relative %: 55.2 %
Platelets: 279 10*3/uL (ref 140–400)
RBC: 5.53 10*6/uL — ABNORMAL HIGH (ref 3.80–5.10)
RDW: 13.4 % (ref 11.0–15.0)
Total Lymphocyte: 34.9 %
WBC mixed population: 511 cells/uL (ref 200–950)
WBC: 7.1 10*3/uL (ref 3.8–10.8)

## 2017-09-27 LAB — COMPLETE METABOLIC PANEL WITH GFR
AG Ratio: 1.5 (calc) (ref 1.0–2.5)
ALT: 24 U/L (ref 6–29)
AST: 28 U/L (ref 10–35)
Albumin: 4.5 g/dL (ref 3.6–5.1)
Alkaline phosphatase (APISO): 94 U/L (ref 33–130)
BUN: 12 mg/dL (ref 7–25)
CO2: 26 mmol/L (ref 20–32)
Calcium: 10 mg/dL (ref 8.6–10.4)
Chloride: 99 mmol/L (ref 98–110)
Creat: 0.85 mg/dL (ref 0.60–0.93)
GFR, Est African American: 78 mL/min/{1.73_m2} (ref >=60)
GFR, Est Non African American: 67 mL/min/{1.73_m2} (ref >=60)
Globulin: 3 g/dL (calc) (ref 1.9–3.7)
Glucose, Bld: 89 mg/dL (ref 65–99)
Potassium: 4.6 mmol/L (ref 3.5–5.3)
Sodium: 138 mmol/L (ref 135–146)
Total Bilirubin: 0.4 mg/dL (ref 0.2–1.2)
Total Protein: 7.5 g/dL (ref 6.1–8.1)

## 2017-09-27 LAB — URINALYSIS, ROUTINE W REFLEX MICROSCOPIC
Bilirubin Urine: NEGATIVE
Glucose, UA: NEGATIVE
Hgb urine dipstick: NEGATIVE
Ketones, ur: NEGATIVE
Leukocytes, UA: NEGATIVE
Nitrite: NEGATIVE
Protein, ur: NEGATIVE
Specific Gravity, Urine: 1.009 (ref 1.001–1.03)
pH: 7 (ref 5.0–8.0)

## 2017-09-27 LAB — B12 AND FOLATE PANEL
Folate: 21.6 ng/mL
Vitamin B-12: 1149 pg/mL — ABNORMAL HIGH (ref 200–1100)

## 2017-09-27 LAB — TSH: TSH: 3.8 mIU/L (ref 0.40–4.50)

## 2017-09-27 NOTE — Telephone Encounter (Signed)
Spoke w/pt's daughter and she informed me that she went by her mother's house and when her mom opened the door she was in tears and could not explain why. She wanted to know if Dr. Madilyn Fireman would consider doing a hormone workup she feels that what's going on with her mother is not an "emotional" thing she feels that it may be more hormonal. I told her that I would fwd this to Dr. Madilyn Fireman for advice and if she felt that this is something that should be done then we can proceed. She asked if the lab request can be sent to the [facility] location. I advised her that depending on the labs needed we may be able to add them to what was done already.Audelia Hives [place]

## 2017-09-27 NOTE — Telephone Encounter (Signed)
We can check some hormones.  Order placed. Wasn't sure what to change them to for [facility].

## 2017-09-28 ENCOUNTER — Telehealth: Payer: Self-pay | Admitting: *Deleted

## 2017-09-28 ENCOUNTER — Encounter: Payer: Self-pay | Admitting: Family Medicine

## 2017-09-28 NOTE — Telephone Encounter (Signed)
Returning pt's call she informed me that she realized that she has not been taking her Paxil and she had not been taking this for the past 3-4 days. She did restart medication today. She feels that this may account for the crying episodes. She wanted to know should she still go get the labwork done. Fwd to pcp for advice.Cheryl [name] [place] \

## 2017-09-28 NOTE — Telephone Encounter (Signed)
Ah, with 100% call as how she is feeling.  Especially if it was stopped abruptly and not tapered.  It can make you feel more nervous and anxious jittery tearful headachy and can affect blood pressure if stopped without tapering.

## 2017-09-28 NOTE — Telephone Encounter (Signed)
Called and informed pt's daughter of labs. She will take her mother over to get these done .Audelia Hives [place]

## 2017-09-28 NOTE — Addendum Note (Signed)
Addended by: Teddy Spike on: 09/28/2017 08:44 AM   Modules accepted: Orders

## 2017-09-29 NOTE — Telephone Encounter (Signed)
I would say yes, go ahead and restart the nitroglycerin patch.  I definitely think that this was coming from the abrupt cessation of the Paxil.

## 2017-09-29 NOTE — Telephone Encounter (Signed)
Pt advised to restart patch.Cheryl [name]

## 2017-09-29 NOTE — Telephone Encounter (Signed)
Called pt and advised her of recommendations. She stated that she is starting to feel better, sleep is getting back to normal. She is now wondering if the nitroglycerin patches can be restarted? And if she should still have the labs done?Marland KitchenMaryruth Eve, Lahoma Crocker, CMA

## 2017-10-04 ENCOUNTER — Ambulatory Visit: Payer: PPO | Admitting: Family Medicine

## 2017-10-19 ENCOUNTER — Encounter: Payer: Self-pay | Admitting: Sports Medicine

## 2017-10-19 ENCOUNTER — Ambulatory Visit (INDEPENDENT_AMBULATORY_CARE_PROVIDER_SITE_OTHER): Payer: PPO | Admitting: Sports Medicine

## 2017-10-19 DIAGNOSIS — B078 Other viral warts: Secondary | ICD-10-CM

## 2017-10-19 DIAGNOSIS — M7661 Achilles tendinitis, right leg: Secondary | ICD-10-CM | POA: Diagnosis not present

## 2017-10-19 NOTE — Assessment & Plan Note (Signed)
Repeat cryotherapy. Return as needed.

## 2017-10-19 NOTE — Progress Notes (Signed)
Subjective:    CC: Recheck Achilles  HPI: Cheryl [name] returns, I been treating her for insertional Achilles tendinosis with custom orthotics with heel lifts, nitroglycerin patches, activity modification, and eccentric rehabilitation exercises.  She had a complete resolution of pain until a session of yoga on Tuesday.  Had a slight increase in pain but otherwise is heading back in the right direction.  We also did a cryotherapy of the left anterior knee verruca, it improved slightly.  I reviewed the past medical history, family history, social history, surgical history, and allergies today and no changes were needed.  Please see the problem list section below in epic for further details.  Past Medical History: Past Medical History:  Diagnosis Date  . Anemia   . Anxiety    d/t recent family issues  . Arthritis   . Bone spur   . Chronic back pain    stenosis and OA  . Depression    takes Paxil daily  . Diverticulosis   . Dizzy    occasionally  . Fatty liver    has had 2 Hep shots and the final one is in June 18  . GERD (gastroesophageal reflux disease)    takes Omeprazole daily  . Hepatitis 2012  . History of bronchitis    20-25 yrs ago  . History of colon polyps    precancerous  . History of shingles   . Hyperlipidemia    takes Simvastatin daily  . IBS (irritable bowel syndrome)    more on side of constipation-takes Miralax daily  . Insomnia    takes Melatonin and Ambien nightly  . Internal hemorrhoids   . Joint pain   . Joint swelling   . Lung nodules    calcified   . Migraine headache    migraines-thinks coming from neck.Last one1/28/18  . Pneumonia    hx of-20 yrs ago-=-walking  . Renal cyst    left  . Uterine cancer ([place]) 1973   Past Surgical History: Past Surgical History:  Procedure Laterality Date  . APPENDECTOMY  1966  . cataracts     bilateral  . COLONOSCOPY    . EUS  02/10/2011   Procedure: UPPER ENDOSCOPIC ULTRASOUND (EUS) RADIAL;  Surgeon: Owens Loffler, MD;  Location: [facility];  Service: Endoscopy;  Laterality: N/A;  radial linear   . RECTOCELE REPAIR  2011  . ROTATOR CUFF REPAIR  2009   left  . TONSILLECTOMY    . UPPER GASTROINTESTINAL ENDOSCOPY    . VAGINAL HYSTERECTOMY  1973   Social History: Social History   Socioeconomic History  . Marital status: Legally Separated    Spouse name: Not on file  . Number of children: 4  . Years of education: Not on file  . Highest education level: Not on file  Occupational History  . Occupation: Retired      Fish farm manager: RETIRED  Social Needs  . Financial resource strain: Not on file  . Food insecurity:    Worry: Not on file    Inability: Not on file  . Transportation needs:    Medical: Not on file    Non-medical: Not on file  Tobacco Use  . Smoking status: Former Smoker    Last attempt to quit: 02/07/1993    Years since quitting: 24.7  . Smokeless tobacco: Never Used  Substance and Sexual Activity  . Alcohol use: No  . Drug use: No  . Sexual activity: Not on file  Lifestyle  . Physical activity:  Days per week: Not on file    Minutes per session: Not on file  . Stress: Not on file  Relationships  . Social connections:    Talks on phone: Not on file    Gets together: Not on file    Attends religious service: Not on file    Active member of club or organization: Not on file    Attends meetings of clubs or organizations: Not on file    Relationship status: Not on file  Other Topics Concern  . Not on file  Social History Narrative   Walking daily.              Epworth Sleepiness Scale = 1 (as of 04/10/15)   Family History: Family History  Problem Relation Age of Onset  . Alcohol abuse Father   . Lung cancer Father        smoker  . Kidney failure Mother        med induced  . Rheum arthritis Mother   . Depression Brother   . Heart disease Unknown        Paternal family   . Heart failure Daughter   . Lung cancer Son   . Crohn's disease Brother   . Alcohol  abuse Brother   . HIV Son   . Malignant hyperthermia Neg Hx   . Colon cancer Neg Hx   . Esophageal cancer Neg Hx   . Rectal cancer Neg Hx   . Stomach cancer Neg Hx    Allergies: Allergies  Allergen Reactions  . Fluoxetine Hcl Shortness Of Breath and Itching    REACTION: itching,SOB  . Contrast Media [Iodinated Diagnostic Agents] Itching    Multilance.   . Dexamethasone Diarrhea and Nausea And Vomiting  . Metrizamide Itching    Multilance.   . Nitroglycerin     Nausea,dizziness,sweats  . Rosuvastatin Other (See Comments)    bodyaches and cramping  . Simvastatin Other (See Comments)    Muscle aches and HA  . Codeine Sulfate Itching    REACTION: itching  . Sulfasalazine Nausea Only    REACTION: Nausea  . Sulfonamide Derivatives Nausea Only    REACTION: Nausea   Medications: See med rec.  Review of Systems: No fevers, chills, night sweats, weight loss, chest pain, or shortness of breath.   Objective:    General: Well Developed, well nourished, and in no acute distress.  Neuro: Alert and oriented x3, extra-ocular muscles intact, sensation grossly intact.  HEENT: Normocephalic, atraumatic, pupils equal round reactive to light, neck supple, no masses, no lymphadenopathy, thyroid nonpalpable.  Skin: Warm and dry, no rashes.  Persistence of a 1 cm verruca on the left anterior knee. Cardiac: Regular rate and rhythm, no murmurs rubs or gallops, no lower extremity edema.  Respiratory: Clear to auscultation bilaterally. Not using accessory muscles, speaking in full sentences. Right ankle: No visible erythema or swelling. Nitroglycerin patch in place Range of motion is full in all directions. Strength is 5/5 in all directions. Stable lateral and medial ligaments; squeeze test and kleiger test unremarkable; Talar dome nontender; No pain at base of 5th MT; No tenderness over cuboid; No tenderness over N spot or navicular prominence No tenderness on posterior aspects of lateral and  medial malleolus No sign of peroneal tendon subluxations; Negative tarsal tunnel tinel's Able to walk 4 steps.  Procedure:  Cryodestruction of left anterior knee verruca Consent obtained and verified. Time-out conducted. Noted no overlying erythema, induration, or other signs of local infection.  Completed without difficulty using Cryo-Gun. Advised to call if fevers/chills, erythema, induration, drainage, or persistent bleeding.  Impression and Recommendations:    Right insertional Achilles tendinopathy Fantastic improvement with near complete pain relief after 1 month with custom orthotics, rehab exercises and nitroglycerin patches. She had a bit of a reinjury while doing yoga, I think we should just stay the course for now. Return to see me in a month if she is having persistent discomfort.  Verruca Repeat cryotherapy. Return as needed.  I spent 25 minutes with this patient, greater than 50% was face-to-face time counseling regarding the above diagnoses, this was separate from the time spent performing the above procedure and consisted of discussions regarding the natural history of Achilles tendinosis and the duration of treatment required for topical nitroglycerin. ___________________________________________ Gwen Her. Dianah Field, M.D., ABFM., CAQSM. Primary Care and [place] Instructor of [place] [facility] Medicine

## 2017-10-19 NOTE — Assessment & Plan Note (Signed)
Fantastic improvement with near complete pain relief after 1 month with custom orthotics, rehab exercises and nitroglycerin patches. She had a bit of a reinjury while doing yoga, I think we should just stay the course for now. Return to see me in a month if she is having persistent discomfort.

## 2017-10-20 ENCOUNTER — Other Ambulatory Visit: Payer: Self-pay | Admitting: Family Medicine

## 2017-10-20 NOTE — Telephone Encounter (Signed)
Routing to pcp for signature.Vani Gunner Lynetta, CMA  

## 2017-10-31 ENCOUNTER — Encounter: Payer: Self-pay | Admitting: Family Medicine

## 2017-10-31 ENCOUNTER — Other Ambulatory Visit: Payer: Self-pay | Admitting: Family Medicine

## 2017-10-31 NOTE — Telephone Encounter (Signed)
Can you call the pharmacy, we sent her tramadol  Last week.  See med list. Not sure why they are not filling it

## 2017-11-01 ENCOUNTER — Encounter: Payer: Self-pay | Admitting: Family Medicine

## 2017-11-02 MED ORDER — TRAMADOL HCL 50 MG PO TABS: 50.0000 mg | ORAL_TABLET | Freq: Two times a day (BID) | ORAL | 0 refills | Status: DC | PRN

## 2017-11-07 ENCOUNTER — Ambulatory Visit (INDEPENDENT_AMBULATORY_CARE_PROVIDER_SITE_OTHER): Payer: PPO | Admitting: Sports Medicine

## 2017-11-07 ENCOUNTER — Encounter: Payer: Self-pay | Admitting: Sports Medicine

## 2017-11-07 DIAGNOSIS — R200 Anesthesia of skin: Secondary | ICD-10-CM | POA: Insufficient documentation

## 2017-11-07 DIAGNOSIS — R208 Other disturbances of skin sensation: Secondary | ICD-10-CM | POA: Diagnosis not present

## 2017-11-07 DIAGNOSIS — M79671 Pain in right foot: Secondary | ICD-10-CM | POA: Diagnosis not present

## 2017-11-07 NOTE — Assessment & Plan Note (Signed)
Persistent now for years. Also some in the right foot. Proceeding with nerve conduction and EMG.

## 2017-11-07 NOTE — Assessment & Plan Note (Addendum)
Initially suspected insertional Achilles tendinosis. No longer has any pain over the Achilles insertion, but persistent foot pain, over the midfoot. At this point we are going to proceed with an MRI of the foot.  MRI is completely normal.  The foot is structurally normal.  If persistent pain after 2-week of limited weightbearing we will try a dorsal tarsometatarsal injection.

## 2017-11-07 NOTE — Progress Notes (Addendum)
Subjective:    CC: Follow-up  HPI: Right foot pain: Pain at the Achilles insertion has improved, she does endorse widespread foot pain, x-rays were negative, we tried custom orthotics, nitroglycerin patches.  Left foot numbness and tingling: Mostly on the left, right side as well.  No back pain.  I reviewed the past medical history, family history, social history, surgical history, and allergies today and no changes were needed.  Please see the problem list section below in epic for further details.  Past Medical History: Past Medical History:  Diagnosis Date  . Anemia   . Anxiety    d/t recent family issues  . Arthritis   . Bone spur   . Chronic back pain    stenosis and OA  . Depression    takes Paxil daily  . Diverticulosis   . Dizzy    occasionally  . Fatty liver    has had 2 Hep shots and the final one is in June 18  . GERD (gastroesophageal reflux disease)    takes Omeprazole daily  . Hepatitis 2012  . History of bronchitis    20-25 yrs ago  . History of colon polyps    precancerous  . History of shingles   . Hyperlipidemia    takes Simvastatin daily  . IBS (irritable bowel syndrome)    more on side of constipation-takes Miralax daily  . Insomnia    takes Melatonin and Ambien nightly  . Internal hemorrhoids   . Joint pain   . Joint swelling   . Lung nodules    calcified   . Migraine headache    migraines-thinks coming from neck.Last one1/28/18  . Pneumonia    hx of-20 yrs ago-=-walking  . Renal cyst    left  . Uterine cancer ([place]) 1973   Past Surgical History: Past Surgical History:  Procedure Laterality Date  . APPENDECTOMY  1966  . cataracts     bilateral  . COLONOSCOPY    . EUS  02/10/2011   Procedure: UPPER ENDOSCOPIC ULTRASOUND (EUS) RADIAL;  Surgeon: Owens Loffler, MD;  Location: [facility];  Service: Endoscopy;  Laterality: N/A;  radial linear   . RECTOCELE REPAIR  2011  . ROTATOR CUFF REPAIR  2009   left  . TONSILLECTOMY    . UPPER  GASTROINTESTINAL ENDOSCOPY    . VAGINAL HYSTERECTOMY  1973   Social History: Social History   Socioeconomic History  . Marital status: Legally Separated    Spouse name: Not on file  . Number of children: 4  . Years of education: Not on file  . Highest education level: Not on file  Occupational History  . Occupation: Retired      Fish farm manager: RETIRED  Social Needs  . Financial resource strain: Not on file  . Food insecurity:    Worry: Not on file    Inability: Not on file  . Transportation needs:    Medical: Not on file    Non-medical: Not on file  Tobacco Use  . Smoking status: Former Smoker    Last attempt to quit: 02/07/1993    Years since quitting: 24.7  . Smokeless tobacco: Never Used  Substance and Sexual Activity  . Alcohol use: No  . Drug use: No  . Sexual activity: Not on file  Lifestyle  . Physical activity:    Days per week: Not on file    Minutes per session: Not on file  . Stress: Not on file  Relationships  . Social connections:  Talks on phone: Not on file    Gets together: Not on file    Attends religious service: Not on file    Active member of club or organization: Not on file    Attends meetings of clubs or organizations: Not on file    Relationship status: Not on file  Other Topics Concern  . Not on file  Social History Narrative   Walking daily.              Epworth Sleepiness Scale = 1 (as of 04/10/15)   Family History: Family History  Problem Relation Age of Onset  . Alcohol abuse Father   . Lung cancer Father        smoker  . Kidney failure Mother        med induced  . Rheum arthritis Mother   . Depression Brother   . Heart disease Unknown        Paternal family   . Heart failure Daughter   . Lung cancer Son   . Crohn's disease Brother   . Alcohol abuse Brother   . HIV Son   . Malignant hyperthermia Neg Hx   . Colon cancer Neg Hx   . Esophageal cancer Neg Hx   . Rectal cancer Neg Hx   . Stomach cancer Neg Hx     Allergies: Allergies  Allergen Reactions  . Fluoxetine Hcl Shortness Of Breath and Itching    REACTION: itching,SOB  . Contrast Media [Iodinated Diagnostic Agents] Itching    Multilance.   . Dexamethasone Diarrhea and Nausea And Vomiting  . Livalo [Pitavastatin] Other (See Comments)    Myalgia   . Metrizamide Itching    Multilance.   . Nitroglycerin     Nausea,dizziness,sweats  . Rosuvastatin Other (See Comments)    bodyaches and cramping  . Simvastatin Other (See Comments)    Muscle aches and HA  . Codeine Sulfate Itching    REACTION: itching  . Sulfasalazine Nausea Only    REACTION: Nausea  . Sulfonamide Derivatives Nausea Only    REACTION: Nausea   Medications: See med rec.  Review of Systems: No fevers, chills, night sweats, weight loss, chest pain, or shortness of breath.   Objective:    General: Well Developed, well nourished, and in no acute distress.  Neuro: Alert and oriented x3, extra-ocular muscles intact, sensation grossly intact.  HEENT: Normocephalic, atraumatic, pupils equal round reactive to light, neck supple, no masses, no lymphadenopathy, thyroid nonpalpable.  Skin: Warm and dry, no rashes. Cardiac: Regular rate and rhythm, no murmurs rubs or gallops, no lower extremity edema.  Respiratory: Clear to auscultation bilaterally. Not using accessory muscles, speaking in full sentences. Bilateral feet:  No visible erythema or swelling. Range of motion is full in all directions. Strength is 5/5 in all directions. No hallux valgus. No pes cavus or pes planus. No abnormal callus noted. No pain over the navicular prominence, or base of fifth metatarsal. No tenderness to palpation of the calcaneal insertion of plantar fascia. No pain at the Achilles insertion. No pain over the calcaneal bursa. No pain of the retrocalcaneal bursa. No tenderness to palpation over the tarsals, metatarsals, or phalanges. No hallux rigidus or limitus. No tenderness palpation  over interphalangeal joints. No pain with compression of the metatarsal heads. Neurovascularly intact distally.  Impression and Recommendations:    Right foot pain Initially suspected insertional Achilles tendinosis. No longer has any pain over the Achilles insertion, but persistent foot pain, over the midfoot. At  this point we are going to proceed with an MRI of the foot.  MRI is completely normal.  The foot is structurally normal.  If persistent pain after 2-week of limited weightbearing we will try a dorsal tarsometatarsal injection.  Numbness of left foot Persistent now for years. Also some in the right foot. Proceeding with nerve conduction and EMG. ___________________________________________ Gwen Her. Dianah Field, M.D., ABFM., CAQSM. Primary Care and [place] Instructor of [place] [facility] of Medicine

## 2017-11-08 ENCOUNTER — Other Ambulatory Visit: Payer: Self-pay | Admitting: Cardiovascular Disease

## 2017-11-08 ENCOUNTER — Other Ambulatory Visit: Payer: Self-pay | Admitting: Family Medicine

## 2017-11-08 DIAGNOSIS — Z79899 Other long term (current) drug therapy: Secondary | ICD-10-CM

## 2017-11-08 DIAGNOSIS — E7849 Other hyperlipidemia: Secondary | ICD-10-CM

## 2017-11-16 ENCOUNTER — Ambulatory Visit (HOSPITAL_COMMUNITY)
Admission: RE | Admit: 2017-11-16 | Discharge: 2017-11-16 | Disposition: A | Discharge status: Home / Self Care | Payer: PPO | Source: Ambulatory Visit | Attending: Sports Medicine | Admitting: Sports Medicine

## 2017-11-16 ENCOUNTER — Encounter: Payer: Self-pay | Admitting: Family Medicine

## 2017-11-16 DIAGNOSIS — M79671 Pain in right foot: Secondary | ICD-10-CM | POA: Insufficient documentation

## 2017-11-17 ENCOUNTER — Other Ambulatory Visit: Payer: Self-pay | Admitting: Family Medicine

## 2017-11-27 ENCOUNTER — Encounter: Payer: Self-pay | Admitting: Sports Medicine

## 2017-11-27 ENCOUNTER — Encounter: Payer: Self-pay | Admitting: Family Medicine

## 2017-11-27 NOTE — Telephone Encounter (Signed)
Information has been submitted to [facility] and awaiting determination.   

## 2017-11-27 NOTE — Telephone Encounter (Signed)
Please get [facility] approval for the left knee, I did a series 4 years ago with good relief until now.

## 2017-11-29 NOTE — Telephone Encounter (Signed)
[facility] is covered. There is no copay. The deductible does not apply. The patient is responsible for 25% of the allowable amount. Once the out of pocket is met, the patient will have no financial responsibility. Call reference number [id number].   Patient is agreeable to estimated cost and she will call back to schedule.

## 2017-12-07 ENCOUNTER — Other Ambulatory Visit: Payer: Self-pay | Admitting: Family Medicine

## 2017-12-07 DIAGNOSIS — F419 Anxiety disorder, unspecified: Secondary | ICD-10-CM

## 2017-12-11 ENCOUNTER — Other Ambulatory Visit: Payer: Self-pay | Admitting: Family Medicine

## 2017-12-11 NOTE — Telephone Encounter (Signed)
Routing to pcp for signature.Lesslie Mossa Lynetta, CMA  

## 2017-12-18 ENCOUNTER — Encounter: Payer: Self-pay | Admitting: Family Medicine

## 2017-12-18 DIAGNOSIS — E7849 Other hyperlipidemia: Secondary | ICD-10-CM

## 2017-12-25 DIAGNOSIS — E7849 Other hyperlipidemia: Secondary | ICD-10-CM | POA: Diagnosis not present

## 2017-12-26 ENCOUNTER — Ambulatory Visit (INDEPENDENT_AMBULATORY_CARE_PROVIDER_SITE_OTHER): Payer: PPO | Admitting: Family Medicine

## 2017-12-26 ENCOUNTER — Encounter: Payer: Self-pay | Admitting: Family Medicine

## 2017-12-26 VITALS — BP 106/50 | HR 78 | Temp 98.7°F | Ht 64.0 in | Wt 181.0 lb

## 2017-12-26 DIAGNOSIS — E785 Hyperlipidemia, unspecified: Secondary | ICD-10-CM

## 2017-12-26 DIAGNOSIS — Z0000 Encounter for general adult medical examination without abnormal findings: Secondary | ICD-10-CM

## 2017-12-26 LAB — LIPID PANEL
Cholesterol: 235 mg/dL — ABNORMAL HIGH (ref <200)
HDL: 40 mg/dL — ABNORMAL LOW (ref >50)
LDL Cholesterol (Calc): 151 mg/dL (calc) — ABNORMAL HIGH
Non-HDL Cholesterol (Calc): 195 mg/dL (calc) — ABNORMAL HIGH (ref <130)
Total CHOL/HDL Ratio: 5.9 (calc) — ABNORMAL HIGH (ref <5.0)
Triglycerides: 271 mg/dL — ABNORMAL HIGH (ref <150)

## 2017-12-26 NOTE — Patient Instructions (Addendum)
Consider talking to your pharmacist about the new shingles vaccine to see how much her co-pay would be.  Health Maintenance, Female Adopting a healthy lifestyle and getting preventive care can go a long way to promote health and wellness. Talk with your health care provider about what schedule of regular examinations is right for you. This is a good chance for you to check in with your provider about disease prevention and staying healthy. In between checkups, there are plenty of things you can do on your own. Experts have done a lot of research about which lifestyle changes and preventive measures are most likely to keep you healthy. Ask your health care provider for more information. Weight and diet Eat a healthy diet  Be sure to include plenty of vegetables, fruits, low-fat dairy products, and lean protein.  Do not eat a lot of foods high in solid fats, added sugars, or salt.  Get regular exercise. This is one of the most important things you can do for your health. ? Most adults should exercise for at least 150 minutes each week. The exercise should increase your heart rate and make you sweat (moderate-intensity exercise). ? Most adults should also do strengthening exercises at least twice a week. This is in addition to the moderate-intensity exercise.  Maintain a healthy weight  Body mass index (BMI) is a measurement that can be used to identify possible weight problems. It estimates body fat based on height and weight. Your health care provider can help determine your BMI and help you achieve or maintain a healthy weight.  For females 17 years of age and older: ? A BMI below 18.5 is considered underweight. ? A BMI of 18.5 to 24.9 is normal. ? A BMI of 25 to 29.9 is considered overweight. ? A BMI of 30 and above is considered obese.  Watch levels of cholesterol and blood lipids  You should start having your blood tested for lipids and cholesterol at 75 years of age, then have this  test every 5 years.  You may need to have your cholesterol levels checked more often if: ? Your lipid or cholesterol levels are high. ? You are older than 75 years of age. ? You are at high risk for heart disease.  Cancer screening Lung Cancer  Lung cancer screening is recommended for adults 47-51 years old who are at high risk for lung cancer because of a history of smoking.  A yearly low-dose CT scan of the lungs is recommended for people who: ? Currently smoke. ? Have quit within the past 15 years. ? Have at least a 30-pack-year history of smoking. A pack year is smoking an average of one pack of cigarettes a day for 1 year.  Yearly screening should continue until it has been 15 years since you quit.  Yearly screening should stop if you develop a health problem that would prevent you from having lung cancer treatment.  Breast Cancer  Practice breast self-awareness. This means understanding how your breasts normally appear and feel.  It also means doing regular breast self-exams. Let your health care provider know about any changes, no matter how small.  If you are in your 20s or 30s, you should have a clinical breast exam (CBE) by a health care provider every 1-3 years as part of a regular health exam.  If you are 70 or older, have a CBE every year. Also consider having a breast X-ray (mammogram) every year.  If you have a family history  of breast cancer, talk to your health care provider about genetic screening.  If you are at high risk for breast cancer, talk to your health care provider about having an MRI and a mammogram every year.  Breast cancer gene (BRCA) assessment is recommended for women who have family members with BRCA-related cancers. BRCA-related cancers include: ? Breast. ? Ovarian. ? Tubal. ? Peritoneal cancers.  Results of the assessment will determine the need for genetic counseling and BRCA1 and BRCA2 testing.  Cervical Cancer Your health care  provider may recommend that you be screened regularly for cancer of the pelvic organs (ovaries, uterus, and vagina). This screening involves a pelvic examination, including checking for microscopic changes to the surface of your cervix (Pap test). You may be encouraged to have this screening done every 3 years, beginning at age 20.  For women ages 58-65, health care providers may recommend pelvic exams and Pap testing every 3 years, or they may recommend the Pap and pelvic exam, combined with testing for human papilloma virus (HPV), every 5 years. Some types of HPV increase your risk of cervical cancer. Testing for HPV may also be done on women of any age with unclear Pap test results.  Other health care providers may not recommend any screening for nonpregnant women who are considered low risk for pelvic cancer and who do not have symptoms. Ask your health care provider if a screening pelvic exam is right for you.  If you have had past treatment for cervical cancer or a condition that could lead to cancer, you need Pap tests and screening for cancer for at least 20 years after your treatment. If Pap tests have been discontinued, your risk factors (such as having a new sexual partner) need to be reassessed to determine if screening should resume. Some women have medical problems that increase the chance of getting cervical cancer. In these cases, your health care provider may recommend more frequent screening and Pap tests.  Colorectal Cancer  This type of cancer can be detected and often prevented.  Routine colorectal cancer screening usually begins at 75 years of age and continues through 75 years of age.  Your health care provider may recommend screening at an earlier age if you have risk factors for colon cancer.  Your health care provider may also recommend using home test kits to check for hidden blood in the stool.  A small camera at the end of a tube can be used to examine your colon  directly (sigmoidoscopy or colonoscopy). This is done to check for the earliest forms of colorectal cancer.  Routine screening usually begins at age 27.  Direct examination of the colon should be repeated every 5-10 years through 75 years of age. However, you may need to be screened more often if early forms of precancerous polyps or small growths are found.  Skin Cancer  Check your skin from head to toe regularly.  Tell your health care provider about any new moles or changes in moles, especially if there is a change in a mole's shape or color.  Also tell your health care provider if you have a mole that is larger than the size of a pencil eraser.  Always use sunscreen. Apply sunscreen liberally and repeatedly throughout the day.  Protect yourself by wearing long sleeves, pants, a wide-brimmed hat, and sunglasses whenever you are outside.  Heart disease, diabetes, and high blood pressure  High blood pressure causes heart disease and increases the risk of stroke. High  blood pressure is more likely to develop in: ? People who have blood pressure in the high end of the normal range (130-139/85-89 mm Hg). ? People who are overweight or obese. ? People who are African American.  If you are 90-2 years of age, have your blood pressure checked every 3-5 years. If you are 53 years of age or older, have your blood pressure checked every year. You should have your blood pressure measured twice-once when you are at a hospital or clinic, and once when you are not at a hospital or clinic. Record the average of the two measurements. To check your blood pressure when you are not at a hospital or clinic, you can use: ? An automated blood pressure machine at a pharmacy. ? A home blood pressure monitor.  If you are between 37 years and 1 years old, ask your health care provider if you should take aspirin to prevent strokes.  Have regular diabetes screenings. This involves taking a blood sample to  check your fasting blood sugar level. ? If you are at a normal weight and have a low risk for diabetes, have this test once every three years after 75 years of age. ? If you are overweight and have a high risk for diabetes, consider being tested at a younger age or more often. Preventing infection Hepatitis B  If you have a higher risk for hepatitis B, you should be screened for this virus. You are considered at high risk for hepatitis B if: ? You were born in a country where hepatitis B is common. Ask your health care provider which countries are considered high risk. ? Your parents were born in a high-risk country, and you have not been immunized against hepatitis B (hepatitis B vaccine). ? You have HIV or AIDS. ? You use needles to inject street drugs. ? You live with someone who has hepatitis B. ? You have had sex with someone who has hepatitis B. ? You get hemodialysis treatment. ? You take certain medicines for conditions, including cancer, organ transplantation, and autoimmune conditions.  Hepatitis C  Blood testing is recommended for: ? Everyone born from [birth year] through 1963. ? Anyone with known risk factors for hepatitis C.  Sexually transmitted infections (STIs)  You should be screened for sexually transmitted infections (STIs) including gonorrhea and chlamydia if: ? You are sexually active and are younger than 75 years of age. ? You are older than 75 years of age and your health care provider tells you that you are at risk for this type of infection. ? Your sexual activity has changed since you were last screened and you are at an increased risk for chlamydia or gonorrhea. Ask your health care provider if you are at risk.  If you do not have HIV, but are at risk, it may be recommended that you take a prescription medicine daily to prevent HIV infection. This is called pre-exposure prophylaxis (PrEP). You are considered at risk if: ? You are sexually active and do not regularly  use condoms or know the HIV status of your partner(s). ? You take drugs by injection. ? You are sexually active with a partner who has HIV.  Talk with your health care provider about whether you are at high risk of being infected with HIV. If you choose to begin PrEP, you should first be tested for HIV. You should then be tested every 3 months for as long as you are taking PrEP. Pregnancy  If you are  premenopausal and you may become pregnant, ask your health care provider about preconception counseling.  If you may become pregnant, take 400 to 800 micrograms (mcg) of folic acid every day.  If you want to prevent pregnancy, talk to your health care provider about birth control (contraception). Osteoporosis and menopause  Osteoporosis is a disease in which the bones lose minerals and strength with aging. This can result in serious bone fractures. Your risk for osteoporosis can be identified using a bone density scan.  If you are 23 years of age or older, or if you are at risk for osteoporosis and fractures, ask your health care provider if you should be screened.  Ask your health care provider whether you should take a calcium or vitamin D supplement to lower your risk for osteoporosis.  Menopause may have certain physical symptoms and risks.  Hormone replacement therapy may reduce some of these symptoms and risks. Talk to your health care provider about whether hormone replacement therapy is right for you. Follow these instructions at home:  Schedule regular health, dental, and eye exams.  Stay current with your immunizations.  Do not use any tobacco products including cigarettes, chewing tobacco, or electronic cigarettes.  If you are pregnant, do not drink alcohol.  If you are breastfeeding, limit how much and how often you drink alcohol.  Limit alcohol intake to no more than 1 drink per day for nonpregnant women. One drink equals 12 ounces of beer, 5 ounces of wine, or 1 ounces  of hard liquor.  Do not use street drugs.  Do not share needles.  Ask your health care provider for help if you need support or information about quitting drugs.  Tell your health care provider if you often feel depressed.  Tell your health care provider if you have ever been abused or do not feel safe at home. This information is not intended to replace advice given to you by your health care provider. Make sure you discuss any questions you have with your health care provider. Document Released: 08/09/2010 Document Revised: 07/02/2015 Document Reviewed: 10/28/2014 Elsevier Interactive Patient Education  [employer].

## 2017-12-26 NOTE — Progress Notes (Signed)
Subjective:     Cheryl [name] is a 77 y.o. female and is here for a comprehensive physical exam. The patient reports no problems.  She did just go for her lipids earlier this week.  She is tried multiple statins and has been intolerant.  She is been really working hard on going to the gym regularly she is lost about 14 pounds she says she is completely revamped her diet and cut out sweets and carbs.  She tries to consume healthy fats such as nuts and avocado.  Unfortunately though her cholesterol results were not impressive. She is taking Flaxseed.    Social History   Socioeconomic History  . Marital status: Legally Separated    Spouse name: Not on file  . Number of children: 4  . Years of education: Not on file  . Highest education level: Not on file  Occupational History  . Occupation: Retired      Fish farm manager: RETIRED  Social Needs  . Financial resource strain: Not on file  . Food insecurity:    Worry: Not on file    Inability: Not on file  . Transportation needs:    Medical: Not on file    Non-medical: Not on file  Tobacco Use  . Smoking status: Former Smoker    Last attempt to quit: 02/07/1993    Years since quitting: 24.8  . Smokeless tobacco: Never Used  Substance and Sexual Activity  . Alcohol use: No  . Drug use: No  . Sexual activity: Not on file  Lifestyle  . Physical activity:    Days per week: Not on file    Minutes per session: Not on file  . Stress: Not on file  Relationships  . Social connections:    Talks on phone: Not on file    Gets together: Not on file    Attends religious service: Not on file    Active member of club or organization: Not on file    Attends meetings of clubs or organizations: Not on file    Relationship status: Not on file  . Intimate partner violence:    Fear of current or ex partner: Not on file    Emotionally abused: Not on file    Physically abused: Not on file    Forced sexual activity: Not on file  Other Topics Concern  . Not  on file  Social History Narrative   Walking daily.              Epworth Sleepiness Scale = 1 (as of 04/10/15)   Health Maintenance  Topic Date Due  . COLONOSCOPY  11/03/2020  . TETANUS/TDAP  10/11/2022  . INFLUENZA VACCINE  Completed  . DEXA SCAN  Completed  . PNA vac Low Risk Adult  Completed    The following portions of the patient's history were reviewed and updated as appropriate: allergies, current medications, past family history, past medical history, past social history, past surgical history and problem list.  Review of Systems A comprehensive review of systems was negative.   Objective:    BP (!) 106/50   Pulse 78   Temp 98.7 F (37.1 C) (Oral)   Ht 5\' 4"  (1.626 m)   Wt 181 lb (82.1 kg)   SpO2 97%   BMI 31.07 kg/m  General appearance: alert, cooperative and appears stated age Head: Normocephalic, without obvious abnormality Eyes: conj clear, EOMI, PEERLLA Ears: normal TM's and external ear canals both ears Nose: Nares normal. Septum midline. Mucosa  normal. No drainage or sinus tenderness. Throat: lips, mucosa, and tongue normal; teeth and gums normal Neck: no adenopathy, no carotid bruit, no JVD, supple, symmetrical, trachea midline and thyroid not enlarged, symmetric, no tenderness/mass/nodules Back: symmetric, no curvature. ROM normal. No CVA tenderness. Lungs: clear to auscultation bilaterally Breasts: normal appearance, no masses or tenderness Heart: regular rate and rhythm, S1, S2 normal, no murmur, click, rub or gallop Abdomen: soft, non-tender; bowel sounds normal; no masses,  no organomegaly Extremities: extremities normal, atraumatic, no cyanosis or edema Pulses: 2+ and symmetric Skin: Skin color, texture, turgor normal. No rashes or lesions Lymph nodes: Cervical, supraclavicular, and axillary nodes normal. Neurologic: Alert and oriented X 3, normal strength and tone. Normal symmetric reflexes. Normal coordination and gait    Assessment:     Healthy female exam.      Plan:     See After Visit Summary for Counseling Recommendations   Keep up a regular exercise program and make sure you are eating a healthy diet Try to eat 4 servings of dairy a day, or if you are lactose intolerant take a calcium with vitamin D daily.  Your vaccines are up to date.  Discussed the new shingles vaccine and encouraged her to consider it.  She had the old one done before.  Hypercholesterolemia -I congratulated her on the lifestyle changes in the weight loss and dietary measures.  I think she is done absolutely fantastic job even though her cholesterol numbers have not really budged very much.  I think she is doing some great work and just encouraged her to continue it.  She said she may consult with natural path for options.  She certainly could try something like red yeast rice but we would need to keep an eye on her liver.  Otherwise keep up with her current regimen.

## 2018-01-08 ENCOUNTER — Other Ambulatory Visit: Payer: Self-pay | Admitting: Family Medicine

## 2018-01-08 DIAGNOSIS — Z1231 Encounter for screening mammogram for malignant neoplasm of breast: Secondary | ICD-10-CM

## 2018-01-17 ENCOUNTER — Encounter: Payer: Self-pay | Admitting: Family Medicine

## 2018-01-17 DIAGNOSIS — Z13820 Encounter for screening for osteoporosis: Secondary | ICD-10-CM

## 2018-01-30 ENCOUNTER — Other Ambulatory Visit: Payer: Self-pay | Admitting: Family Medicine

## 2018-02-02 DIAGNOSIS — H04123 Dry eye syndrome of bilateral lacrimal glands: Secondary | ICD-10-CM | POA: Diagnosis not present

## 2018-02-02 DIAGNOSIS — H35372 Puckering of macula, left eye: Secondary | ICD-10-CM | POA: Diagnosis not present

## 2018-02-02 DIAGNOSIS — H5713 Ocular pain, bilateral: Secondary | ICD-10-CM | POA: Diagnosis not present

## 2018-02-08 ENCOUNTER — Encounter: Payer: Self-pay | Admitting: Family Medicine

## 2018-02-19 ENCOUNTER — Ambulatory Visit
Admission: RE | Admit: 2018-02-19 | Discharge: 2018-02-19 | Disposition: A | Discharge status: Home / Self Care | Payer: PPO | Source: Ambulatory Visit | Attending: Family Medicine | Admitting: Family Medicine

## 2018-02-19 DIAGNOSIS — Z1231 Encounter for screening mammogram for malignant neoplasm of breast: Secondary | ICD-10-CM

## 2018-02-20 ENCOUNTER — Other Ambulatory Visit: Payer: Self-pay | Admitting: Family Medicine

## 2018-02-20 DIAGNOSIS — F419 Anxiety disorder, unspecified: Secondary | ICD-10-CM

## 2018-03-15 ENCOUNTER — Other Ambulatory Visit: Payer: Self-pay | Admitting: Family Medicine

## 2018-03-15 ENCOUNTER — Ambulatory Visit
Admission: RE | Admit: 2018-03-15 | Discharge: 2018-03-15 | Disposition: A | Discharge status: Home / Self Care | Payer: PPO | Source: Ambulatory Visit | Attending: Family Medicine | Admitting: Family Medicine

## 2018-03-15 DIAGNOSIS — Z13820 Encounter for screening for osteoporosis: Secondary | ICD-10-CM

## 2018-03-15 DIAGNOSIS — M8589 Other specified disorders of bone density and structure, multiple sites: Secondary | ICD-10-CM | POA: Diagnosis not present

## 2018-03-15 DIAGNOSIS — Z780 Asymptomatic menopausal state: Secondary | ICD-10-CM | POA: Diagnosis not present

## 2018-03-18 ENCOUNTER — Encounter: Payer: Self-pay | Admitting: Family Medicine

## 2018-03-18 DIAGNOSIS — R300 Dysuria: Secondary | ICD-10-CM

## 2018-03-20 DIAGNOSIS — R300 Dysuria: Secondary | ICD-10-CM | POA: Diagnosis not present

## 2018-03-20 NOTE — Telephone Encounter (Signed)
Order sent.

## 2018-03-21 MED ORDER — NITROFURANTOIN MONOHYD MACRO 100 MG PO CAPS: 100.0000 mg | ORAL_CAPSULE | Freq: Two times a day (BID) | ORAL | 0 refills | Status: DC

## 2018-03-22 LAB — URINE CULTURE
MICRO NUMBER:: 182043
SPECIMEN QUALITY:: ADEQUATE

## 2018-03-22 LAB — URINALYSIS, ROUTINE W REFLEX MICROSCOPIC
Bacteria, UA: NONE SEEN /HPF
Bilirubin Urine: NEGATIVE
Glucose, UA: NEGATIVE
Hgb urine dipstick: NEGATIVE
Hyaline Cast: NONE SEEN /LPF
Ketones, ur: NEGATIVE
Nitrite: NEGATIVE
Protein, ur: NEGATIVE
Specific Gravity, Urine: 1.018 (ref 1.001–1.03)
pH: 6.5 (ref 5.0–8.0)

## 2018-03-22 MED ORDER — AMOXICILLIN-POT CLAVULANATE 500-125 MG PO TABS: 1.0000 | ORAL_TABLET | Freq: Two times a day (BID) | ORAL | 0 refills | Status: DC

## 2018-03-22 NOTE — Addendum Note (Signed)
Addended by: [name] D on: 03/22/2018 10:01 PM   Modules accepted: Orders

## 2018-03-23 ENCOUNTER — Encounter: Payer: Self-pay | Admitting: Family Medicine

## 2018-03-23 NOTE — Telephone Encounter (Signed)
Patient advised about the new antibiotic.

## 2018-03-27 ENCOUNTER — Encounter: Payer: Self-pay | Admitting: Family Medicine

## 2018-03-27 ENCOUNTER — Ambulatory Visit (INDEPENDENT_AMBULATORY_CARE_PROVIDER_SITE_OTHER): Payer: PPO | Admitting: Family Medicine

## 2018-03-27 VITALS — BP 120/51 | HR 78 | Ht 64.0 in | Wt 177.0 lb

## 2018-03-27 DIAGNOSIS — K760 Fatty (change of) liver, not elsewhere classified: Secondary | ICD-10-CM | POA: Diagnosis not present

## 2018-03-27 DIAGNOSIS — M7918 Myalgia, other site: Secondary | ICD-10-CM

## 2018-03-27 DIAGNOSIS — M791 Myalgia, unspecified site: Secondary | ICD-10-CM

## 2018-03-27 DIAGNOSIS — E785 Hyperlipidemia, unspecified: Secondary | ICD-10-CM

## 2018-03-27 DIAGNOSIS — Z711 Person with feared health complaint in whom no diagnosis is made: Secondary | ICD-10-CM

## 2018-03-27 MED ORDER — ZOLPIDEM TARTRATE 5 MG PO TABS: 5.0000 mg | ORAL_TABLET | Freq: Every evening | ORAL | 4 refills | Status: DC | PRN

## 2018-03-27 MED ORDER — OMEPRAZOLE 20 MG PO CPDR: DELAYED_RELEASE_CAPSULE | ORAL | 3 refills | Status: DC

## 2018-03-27 MED ORDER — ALPRAZOLAM 0.5 MG PO TABS: ORAL_TABLET | ORAL | 1 refills | Status: DC

## 2018-03-27 NOTE — Progress Notes (Signed)
Acute Office Visit  Subjective:    Patient ID: Cheryl [name], female    DOB: 1940-11-27, 78 y.o.   MRN: [id number]  Chief Complaint  Patient presents with  . Adenopathy    pt reports that she has noticed that she has "bumps under her skin"    HPI Patient is in today for bumps on arms, legs and abdomen.  She was worried when she saw the comment on her mammogram said that she had glandular tissue that was scattered throughout her breast.  She was worried that she had swollen lymph nodes in her breast tissue.  She is very fearful about a diagnosis of cancer.  She says she is noticed it on her arms and on the inner knees.  Has been trying to go to the gym and exercise more often.  She has been doing weights and doing some cardio but cannot quite get her abdominal area to reduce.  She was wondering if it could be a sign that her liver is enlarged.  He does have a current diagnosis of fatty liver and we have been monitoring her liver enzymes.  F/U for lipidemia-Has been intolerant to statins but has still continued to have some diffuse myalgias even off of the medication though she has been doing well with coenzyme Q 10 and red yeast rice.  She is actually supposed to follow-up with [name] next month.  Reports that the myalgias can be head to toe sometimes.   Past Medical History:  Diagnosis Date  . Anemia   . Anxiety    d/t recent family issues  . Arthritis   . Bone spur   . Chronic back pain    stenosis and OA  . Depression    takes Paxil daily  . Diverticulosis   . Dizzy    occasionally  . Fatty liver    has had 2 Hep shots and the final one is in June 18  . GERD (gastroesophageal reflux disease)    takes Omeprazole daily  . Hepatitis 2012  . History of bronchitis    20-25 yrs ago  . History of colon polyps    precancerous  . History of shingles   . Hyperlipidemia    takes Simvastatin daily  . IBS (irritable bowel syndrome)    more on side of constipation-takes  Miralax daily  . Insomnia    takes Melatonin and Ambien nightly  . Internal hemorrhoids   . Joint pain   . Joint swelling   . Lung nodules    calcified   . Migraine headache    migraines-thinks coming from neck.Last one1/28/18  . Pneumonia    hx of-20 yrs ago-=-walking  . Renal cyst    left  . Uterine cancer ([place]) 1973    Past Surgical History:  Procedure Laterality Date  . APPENDECTOMY  1966  . cataracts     bilateral  . COLONOSCOPY    . EUS  02/10/2011   Procedure: UPPER ENDOSCOPIC ULTRASOUND (EUS) RADIAL;  Surgeon: Owens Loffler, MD;  Location: [facility];  Service: Endoscopy;  Laterality: N/A;  radial linear   . RECTOCELE REPAIR  2011  . ROTATOR CUFF REPAIR  2009   left  . TONSILLECTOMY    . UPPER GASTROINTESTINAL ENDOSCOPY    . VAGINAL HYSTERECTOMY  1973    Family History  Problem Relation Age of Onset  . Alcohol abuse Father   . Lung cancer Father        smoker  .  Kidney failure Mother        med induced  . Rheum arthritis Mother   . Depression Brother   . Heart disease Other        Paternal family   . Heart failure Daughter   . Lung cancer Son   . Crohn's disease Brother   . Alcohol abuse Brother   . HIV Son   . Malignant hyperthermia Neg Hx   . Colon cancer Neg Hx   . Esophageal cancer Neg Hx   . Rectal cancer Neg Hx   . Stomach cancer Neg Hx     Social History   Socioeconomic History  . Marital status: Legally Separated    Spouse name: Not on file  . Number of children: 4  . Years of education: Not on file  . Highest education level: Not on file  Occupational History  . Occupation: Retired      Fish farm manager: RETIRED  Social Needs  . Financial resource strain: Not on file  . Food insecurity:    Worry: Not on file    Inability: Not on file  . Transportation needs:    Medical: Not on file    Non-medical: Not on file  Tobacco Use  . Smoking status: Former Smoker    Last attempt to quit: 02/07/1993    Years since quitting: 25.1  . Smokeless  tobacco: Never Used  Substance and Sexual Activity  . Alcohol use: No  . Drug use: No  . Sexual activity: Not on file  Lifestyle  . Physical activity:    Days per week: Not on file    Minutes per session: Not on file  . Stress: Not on file  Relationships  . Social connections:    Talks on phone: Not on file    Gets together: Not on file    Attends religious service: Not on file    Active member of club or organization: Not on file    Attends meetings of clubs or organizations: Not on file    Relationship status: Not on file  . Intimate partner violence:    Fear of current or ex partner: Not on file    Emotionally abused: Not on file    Physically abused: Not on file    Forced sexual activity: Not on file  Other Topics Concern  . Not on file  Social History Narrative   Walking daily.              Epworth Sleepiness Scale = 1 (as of 04/10/15)    Outpatient Medications Prior to Visit  Medication Sig Dispense Refill  . aspirin 81 MG tablet Take 81 mg by mouth daily.      . Cholecalciferol (VITAMIN D) 1000 UNITS capsule Take 1,000 Units by mouth daily.      . fluticasone (FLONASE) 50 MCG/ACT nasal spray SPRAY 2 SPRAYS INTO EACH NOSTRIL EVERY DAY 1 g 3  . MAGNESIUM PO Take 1 tablet by mouth daily.     Marland Kitchen PARoxetine (PAXIL) 20 MG tablet TAKE 1 TABLET BY MOUTH EVERY DAY IN THE MORNING 90 tablet 0  . polyethylene glycol (MIRALAX / GLYCOLAX) packet Take 17 g by mouth daily.    . RESTASIS 0.05 % ophthalmic emulsion Place 1 drop into both eyes 2 (two) times daily.     . traMADol (ULTRAM) 50 MG tablet Take 1 tablet (50 mg total) by mouth 2 (two) times daily as needed. 180 tablet 0  . ALPRAZolam (XANAX) 0.5 MG  tablet TAKE 1 TABLET BY MOUTH AT BEDTIME AS NEEDED FOR ANXIETY 15 tablet 1  . amoxicillin-clavulanate (AUGMENTIN) 500-125 MG tablet Take 1 tablet (500 mg total) by mouth 2 (two) times daily. 6 tablet 0  . diphenhydrAMINE (BENADRYL) 50 MG tablet Take 50 mg by mouth at bedtime as  needed for itching or sleep.    . nitrofurantoin, macrocrystal-monohydrate, (MACROBID) 100 MG capsule Take 1 capsule (100 mg total) by mouth 2 (two) times daily. 10 capsule 0  . omeprazole (PRILOSEC) 20 MG capsule TAKE 1 CAPSULE TWICE DAILY BEFORE A MEAL 180 capsule 3  . zolpidem (AMBIEN) 5 MG tablet TAKE 1 TABLET BY MOUTH AT BEDTIME AS NEEDED FOR SLEEP 30 tablet 2   No facility-administered medications prior to visit.     Allergies  Allergen Reactions  . Fluoxetine Hcl Shortness Of Breath and Itching    REACTION: itching,SOB  . Contrast Media [Iodinated Diagnostic Agents] Itching    Multilance.   . Dexamethasone Diarrhea and Nausea And Vomiting  . Livalo [Pitavastatin] Other (See Comments)    Myalgia   . Metrizamide Itching    Multilance.   . Nitroglycerin     Nausea,dizziness,sweats  . Rosuvastatin Other (See Comments)    bodyaches and cramping  . Simvastatin Other (See Comments)    Muscle aches and HA  . Codeine Sulfate Itching    REACTION: itching  . Sulfasalazine Nausea Only    REACTION: Nausea  . Sulfonamide Derivatives Nausea Only    REACTION: Nausea    ROS     Objective:    Physical Exam  Constitutional: She is oriented to person, place, and time. She appears well-developed and well-nourished.  HENT:  Head: Normocephalic and atraumatic.  Eyes: Conjunctivae and EOM are normal.  Cardiovascular: Normal rate.  Pulmonary/Chest: Effort normal.  Abdominal: Soft. She exhibits no distension and no mass. There is abdominal tenderness. There is no rebound and no guarding.  No hepatomegaly.  Neurological: She is alert and oriented to person, place, and time.  Skin: Skin is dry. No pallor.  Normal fatty tissue of the upper chest. It does feel somewhat lumpy bumpy but I do not feel any discrete nodules or lymph nodes.  Same thing for the inner knee on the left leg as well as her upper arms.  Psychiatric: She has a normal mood and affect. Her behavior is normal.  Vitals  reviewed.   BP (!) 120/51   Pulse 78   Ht 5\' 4"  (1.626 m)   Wt 177 lb (80.3 kg)   SpO2 97%   BMI 30.38 kg/m  Wt Readings from Last 3 Encounters:  03/27/18 177 lb (80.3 kg)  12/26/17 181 lb (82.1 kg)  11/07/17 188 lb (85.3 kg)    There are no preventive care reminders to display for this patient.  There are no preventive care reminders to display for this patient.   Lab Results  Component Value Date   TSH 3.80 09/26/2017   Lab Results  Component Value Date   WBC 7.1 09/26/2017   HGB 15.3 09/26/2017   HCT 46.5 (H) 09/26/2017   MCV 84.1 09/26/2017   PLT 279 09/26/2017   Lab Results  Component Value Date   NA 138 09/26/2017   K 4.6 09/26/2017   CO2 26 09/26/2017   GLUCOSE 89 09/26/2017   BUN 12 09/26/2017   CREATININE 0.85 09/26/2017   BILITOT 0.4 09/26/2017   ALKPHOS 98 08/14/2017   AST 28 09/26/2017   ALT 24 09/26/2017  PROT 7.5 09/26/2017   ALBUMIN 4.1 08/14/2017   CALCIUM 10.0 09/26/2017   ANIONGAP 9 03/07/2016   Lab Results  Component Value Date   CHOL 235 (H) 12/25/2017   Lab Results  Component Value Date   HDL 40 (L) 12/25/2017   Lab Results  Component Value Date   LDLCALC 151 (H) 12/25/2017   Lab Results  Component Value Date   TRIG 271 (H) 12/25/2017   Lab Results  Component Value Date   CHOLHDL 5.9 (H) 12/25/2017   Lab Results  Component Value Date   HGBA1C 5.8 12/17/2013       Assessment & Plan:   Problem List Items Addressed This Visit      Digestive   Fatty liver    I did examine her abdomen today and she does have some excess fat in the abdominal area but reassured her that it is not related to an enlarged liver I did palpate for the liver edge and was unable to feel it which is normal and reassuring.  We are due to recheck liver enzymes so the labs provided.        Other   Myofascial pain    Widespread pain index score of 8 and symptom severity score of 6 total.  She does meet the symptom criteria for possible  fibromyalgia.  Though this is a diagnosis of exclusion we will continue further work-up and evaluation.      Hyperlipidemia    Has been intolerant to statins but has still continued to have some diffuse myalgias even off of the medication though she has been doing well with coenzyme Q 10 and red yeast rice.  She is actually supposed to follow-up with [name] next month.  She wants to have her blood work redrawn right before that so I did provide a lab slip today.      Relevant Orders   COMPLETE METABOLIC PANEL WITH GFR   Lipid panel    Other Visit Diagnoses    Myalgia    -  Primary   Relevant Orders   COMPLETE METABOLIC PANEL WITH GFR   Worried well         Worried well-the lumps that she is feeling underneath her skin is just completely normal adipose tissue.  I think she is noticing it because she has had some atrophy of the skin as she ages.  Gave her reassurance that I do not feel anything worrisome on exam.  Myalgias-I did go ahead and have her complete a questionnaire for possible fibromyalgia.`   Meds ordered this encounter  Medications  . ALPRAZolam (XANAX) 0.5 MG tablet    Sig: TAKE 1 TABLET BY MOUTH AT BEDTIME AS NEEDED FOR ANXIETY    Dispense:  15 tablet    Refill:  1    This request is for a new prescription for a controlled substance as required by Federal/State law..  . omeprazole (PRILOSEC) 20 MG capsule    Sig: TAKE 1 CAPSULE TWICE DAILY BEFORE A MEAL    Dispense:  180 capsule    Refill:  3  . zolpidem (AMBIEN) 5 MG tablet    Sig: Take 1 tablet (5 mg total) by mouth at bedtime as needed. for sleep    Dispense:  30 tablet    Refill:  4    This request is for a new prescription for a controlled substance as required by Federal/State law.     [name], MD

## 2018-03-27 NOTE — Assessment & Plan Note (Signed)
Has been intolerant to statins but has still continued to have some diffuse myalgias even off of the medication though she has been doing well with coenzyme Q 10 and red yeast rice.  She is actually supposed to follow-up with [name] next month.  She wants to have her blood work redrawn right before that so I did provide a lab slip today.

## 2018-03-27 NOTE — Assessment & Plan Note (Signed)
I did examine her abdomen today and she does have some excess fat in the abdominal area but reassured her that it is not related to an enlarged liver I did palpate for the liver edge and was unable to feel it which is normal and reassuring.  We are due to recheck liver enzymes so the labs provided.

## 2018-03-27 NOTE — Assessment & Plan Note (Signed)
Widespread pain index score of 8 and symptom severity score of 6 total.  She does meet the symptom criteria for possible fibromyalgia.  Though this is a diagnosis of exclusion we will continue further work-up and evaluation.

## 2018-04-03 ENCOUNTER — Encounter: Payer: Self-pay | Admitting: Family Medicine

## 2018-04-03 ENCOUNTER — Telehealth: Payer: Self-pay

## 2018-04-03 ENCOUNTER — Ambulatory Visit (INDEPENDENT_AMBULATORY_CARE_PROVIDER_SITE_OTHER): Payer: PPO | Admitting: Family Medicine

## 2018-04-03 VITALS — BP 112/58 | HR 73 | Temp 98.9°F | Ht 64.0 in | Wt 181.0 lb

## 2018-04-03 DIAGNOSIS — J0100 Acute maxillary sinusitis, unspecified: Secondary | ICD-10-CM

## 2018-04-03 MED ORDER — BENZONATATE 200 MG PO CAPS: 200.0000 mg | ORAL_CAPSULE | Freq: Three times a day (TID) | ORAL | 0 refills | Status: DC | PRN

## 2018-04-03 MED ORDER — HYDROCODONE-HOMATROPINE 5-1.5 MG/5ML PO SYRP: 5.0000 mL | ORAL_SOLUTION | Freq: Every evening | ORAL | 0 refills | Status: DC | PRN

## 2018-04-03 MED ORDER — AMOXICILLIN-POT CLAVULANATE 875-125 MG PO TABS: 1.0000 | ORAL_TABLET | Freq: Two times a day (BID) | ORAL | 0 refills | Status: DC

## 2018-04-03 NOTE — Telephone Encounter (Signed)
She has to have an appt.

## 2018-04-03 NOTE — Telephone Encounter (Signed)
Cheryl [name] called and states she feels really bad. She has postnasal drip, cough, congestion and yellow drainage for a few days. She is wanting Dr Madilyn Fireman to call her something in. I tried to schedule the patient for an office visit, she refused.

## 2018-04-03 NOTE — Progress Notes (Signed)
7.3

## 2018-04-03 NOTE — Progress Notes (Signed)
Acute Office Visit  Subjective:    Patient ID: Cheryl [name], female    DOB: May 30, 1940, 78 y.o.   MRN: [id number]  Chief Complaint  Patient presents with  . Cough    x 4 days productive, was thick white changed over to yellow, sneezing, sinus pressure, denies f/s/c she has not tried any OTC medications    HPI 78 year old female comes in today for upper respiratory symptoms.  She says starting about 6 days ago she started to not feel well she started getting a lot of drainage and postnasal drip and by the next day she had laryngitis.  She started to feel little bit better over the weekend but then on Sunday started to feel worse and her cough increased and progressed..  She is also had nasal congestion sinus pressure and sneezing.  She has been having some bilateral maxillary sinus pressure worse on the right compared to the left.  In fact on the right side is radiating into her teeth.  She feels like she is progressively getting worse.  Initially she was seeing a thick white him and now it is turned yellow.Denies fever, chills or sweats.  She has not tried any over-the-counter medications.   Past Medical History:  Diagnosis Date  . Anemia   . Anxiety    d/t recent family issues  . Arthritis   . Bone spur   . Chronic back pain    stenosis and OA  . Depression    takes Paxil daily  . Diverticulosis   . Dizzy    occasionally  . Fatty liver    has had 2 Hep shots and the final one is in June 18  . GERD (gastroesophageal reflux disease)    takes Omeprazole daily  . Hepatitis 2012  . History of bronchitis    20-25 yrs ago  . History of colon polyps    precancerous  . History of shingles   . Hyperlipidemia    takes Simvastatin daily  . IBS (irritable bowel syndrome)    more on side of constipation-takes Miralax daily  . Insomnia    takes Melatonin and Ambien nightly  . Internal hemorrhoids   . Joint pain   . Joint swelling   . Lung nodules    calcified   . Migraine  headache    migraines-thinks coming from neck.Last one1/28/18  . Pneumonia    hx of-20 yrs ago-=-walking  . Renal cyst    left  . Uterine cancer ([place]) 1973    Past Surgical History:  Procedure Laterality Date  . APPENDECTOMY  1966  . cataracts     bilateral  . COLONOSCOPY    . EUS  02/10/2011   Procedure: UPPER ENDOSCOPIC ULTRASOUND (EUS) RADIAL;  Surgeon: Owens Loffler, MD;  Location: [facility];  Service: Endoscopy;  Laterality: N/A;  radial linear   . RECTOCELE REPAIR  2011  . ROTATOR CUFF REPAIR  2009   left  . TONSILLECTOMY    . UPPER GASTROINTESTINAL ENDOSCOPY    . VAGINAL HYSTERECTOMY  1973    Family History  Problem Relation Age of Onset  . Alcohol abuse Father   . Lung cancer Father        smoker  . Kidney failure Mother        med induced  . Rheum arthritis Mother   . Depression Brother   . Heart disease Other        Paternal family   . Heart failure Daughter   .  Lung cancer Son   . Crohn's disease Brother   . Alcohol abuse Brother   . HIV Son   . Malignant hyperthermia Neg Hx   . Colon cancer Neg Hx   . Esophageal cancer Neg Hx   . Rectal cancer Neg Hx   . Stomach cancer Neg Hx     Social History   Socioeconomic History  . Marital status: Legally Separated    Spouse name: Not on file  . Number of children: 4  . Years of education: Not on file  . Highest education level: Not on file  Occupational History  . Occupation: Retired      Fish farm manager: RETIRED  Social Needs  . Financial resource strain: Not on file  . Food insecurity:    Worry: Not on file    Inability: Not on file  . Transportation needs:    Medical: Not on file    Non-medical: Not on file  Tobacco Use  . Smoking status: Former Smoker    Last attempt to quit: 02/07/1993    Years since quitting: 25.1  . Smokeless tobacco: Never Used  Substance and Sexual Activity  . Alcohol use: No  . Drug use: No  . Sexual activity: Not on file  Lifestyle  . Physical activity:    Days per  week: Not on file    Minutes per session: Not on file  . Stress: Not on file  Relationships  . Social connections:    Talks on phone: Not on file    Gets together: Not on file    Attends religious service: Not on file    Active member of club or organization: Not on file    Attends meetings of clubs or organizations: Not on file    Relationship status: Not on file  . Intimate partner violence:    Fear of current or ex partner: Not on file    Emotionally abused: Not on file    Physically abused: Not on file    Forced sexual activity: Not on file  Other Topics Concern  . Not on file  Social History Narrative   Walking daily.              Epworth Sleepiness Scale = 1 (as of 04/10/15)    Outpatient Medications Prior to Visit  Medication Sig Dispense Refill  . ALPRAZolam (XANAX) 0.5 MG tablet TAKE 1 TABLET BY MOUTH AT BEDTIME AS NEEDED FOR ANXIETY 15 tablet 1  . aspirin 81 MG tablet Take 81 mg by mouth daily.      . Cholecalciferol (VITAMIN D) 1000 UNITS capsule Take 1,000 Units by mouth daily.      . fluticasone (FLONASE) 50 MCG/ACT nasal spray SPRAY 2 SPRAYS INTO EACH NOSTRIL EVERY DAY 1 g 3  . MAGNESIUM PO Take 1 tablet by mouth daily.     Marland Kitchen omeprazole (PRILOSEC) 20 MG capsule TAKE 1 CAPSULE TWICE DAILY BEFORE A MEAL 180 capsule 3  . PARoxetine (PAXIL) 20 MG tablet TAKE 1 TABLET BY MOUTH EVERY DAY IN THE MORNING 90 tablet 0  . polyethylene glycol (MIRALAX / GLYCOLAX) packet Take 17 g by mouth daily.    . RESTASIS 0.05 % ophthalmic emulsion Place 1 drop into both eyes 2 (two) times daily.     . traMADol (ULTRAM) 50 MG tablet Take 1 tablet (50 mg total) by mouth 2 (two) times daily as needed. 180 tablet 0  . zolpidem (AMBIEN) 5 MG tablet Take 1 tablet (5 mg total)  by mouth at bedtime as needed. for sleep 30 tablet 4   No facility-administered medications prior to visit.     Allergies  Allergen Reactions  . Fluoxetine Hcl Shortness Of Breath and Itching    REACTION: itching,SOB   . Contrast Media [Iodinated Diagnostic Agents] Itching    Multilance.   . Dexamethasone Diarrhea and Nausea And Vomiting  . Livalo [Pitavastatin] Other (See Comments)    Myalgia   . Metrizamide Itching    Multilance.   . Nitroglycerin     Nausea,dizziness,sweats  . Rosuvastatin Other (See Comments)    bodyaches and cramping  . Simvastatin Other (See Comments)    Muscle aches and HA  . Codeine Sulfate Itching    REACTION: itching  . Sulfasalazine Nausea Only    REACTION: Nausea  . Sulfonamide Derivatives Nausea Only    REACTION: Nausea    ROS     Objective:    Physical Exam  Constitutional: She is oriented to person, place, and time. She appears well-developed and well-nourished.  HENT:  Head: Normocephalic and atraumatic.  Right Ear: External ear normal.  Left Ear: External ear normal.  Nose: Nose normal.  Mouth/Throat: Oropharynx is clear and moist.  TMs and canals are clear.   Eyes: Pupils are equal, round, and reactive to light. Conjunctivae and EOM are normal.  Neck: Neck supple. No thyromegaly present.  Cardiovascular: Normal rate, regular rhythm and normal heart sounds.  Pulmonary/Chest: Effort normal and breath sounds normal. She has no wheezes.  Lymphadenopathy:    She has no cervical adenopathy.  Neurological: She is alert and oriented to person, place, and time.  Skin: Skin is warm and dry.  Psychiatric: She has a normal mood and affect.    BP (!) 112/58   Pulse 73   Temp 98.9 F (37.2 C)   Ht 5\' 4"  (1.626 m)   Wt 181 lb (82.1 kg)   SpO2 98%   BMI 31.07 kg/m  Wt Readings from Last 3 Encounters:  04/03/18 181 lb (82.1 kg)  03/27/18 177 lb (80.3 kg)  12/26/17 181 lb (82.1 kg)    There are no preventive care reminders to display for this patient.  There are no preventive care reminders to display for this patient.   Lab Results  Component Value Date   TSH 3.80 09/26/2017   Lab Results  Component Value Date   WBC 7.1 09/26/2017   HGB  15.3 09/26/2017   HCT 46.5 (H) 09/26/2017   MCV 84.1 09/26/2017   PLT 279 09/26/2017   Lab Results  Component Value Date   NA 138 09/26/2017   K 4.6 09/26/2017   CO2 26 09/26/2017   GLUCOSE 89 09/26/2017   BUN 12 09/26/2017   CREATININE 0.85 09/26/2017   BILITOT 0.4 09/26/2017   ALKPHOS 98 08/14/2017   AST 28 09/26/2017   ALT 24 09/26/2017   PROT 7.5 09/26/2017   ALBUMIN 4.1 08/14/2017   CALCIUM 10.0 09/26/2017   ANIONGAP 9 03/07/2016   Lab Results  Component Value Date   CHOL 235 (H) 12/25/2017   Lab Results  Component Value Date   HDL 40 (L) 12/25/2017   Lab Results  Component Value Date   LDLCALC 151 (H) 12/25/2017   Lab Results  Component Value Date   TRIG 271 (H) 12/25/2017   Lab Results  Component Value Date   CHOLHDL 5.9 (H) 12/25/2017   Lab Results  Component Value Date   HGBA1C 5.8 12/17/2013  Assessment & Plan:   Problem List Items Addressed This Visit    None    Visit Diagnoses    Acute non-recurrent maxillary sinusitis    -  Primary   Relevant Medications   amoxicillin-clavulanate (AUGMENTIN) 875-125 MG tablet   benzonatate (TESSALON) 200 MG capsule   HYDROcodone-homatropine (HYCODAN) 5-1.5 MG/5ML syrup      Acute sinusitis-we will treat with Augmentin.  Call if not improving over this next week.  Also sent a prescription for cough medicine at bedtime which is keeping her up at night as well as Tessalon Perles for the daytime.  It is clear today which is reassuring.   Meds ordered this encounter  Medications  . amoxicillin-clavulanate (AUGMENTIN) 875-125 MG tablet    Sig: Take 1 tablet by mouth 2 (two) times daily.    Dispense:  20 tablet    Refill:  0  . benzonatate (TESSALON) 200 MG capsule    Sig: Take 1 capsule (200 mg total) by mouth 3 (three) times daily as needed for cough.    Dispense:  30 capsule    Refill:  0  . HYDROcodone-homatropine (HYCODAN) 5-1.5 MG/5ML syrup    Sig: Take 5 mLs by mouth at bedtime as needed  for cough.    Dispense:  120 mL    Refill:  0     [name], MD

## 2018-04-03 NOTE — Telephone Encounter (Signed)
Patient advised and scheduled.  

## 2018-04-19 ENCOUNTER — Other Ambulatory Visit: Payer: Self-pay | Admitting: Family Medicine

## 2018-04-19 DIAGNOSIS — F419 Anxiety disorder, unspecified: Secondary | ICD-10-CM

## 2018-04-23 ENCOUNTER — Encounter: Payer: Self-pay | Admitting: Family Medicine

## 2018-05-02 ENCOUNTER — Ambulatory Visit: Payer: PPO | Admitting: Cardiovascular Disease

## 2018-06-08 ENCOUNTER — Encounter: Payer: Self-pay | Admitting: Family Medicine

## 2018-06-08 MED ORDER — HYDROXYZINE PAMOATE 25 MG PO CAPS: 25.0000 mg | ORAL_CAPSULE | Freq: Three times a day (TID) | ORAL | 0 refills | Status: DC | PRN

## 2018-06-11 NOTE — Telephone Encounter (Signed)
Patient scheduled.

## 2018-06-12 ENCOUNTER — Encounter: Payer: Self-pay | Admitting: Family Medicine

## 2018-06-12 ENCOUNTER — Ambulatory Visit (INDEPENDENT_AMBULATORY_CARE_PROVIDER_SITE_OTHER): Payer: PPO | Admitting: Family Medicine

## 2018-06-12 VITALS — HR 80 | Ht 64.0 in | Wt 174.0 lb

## 2018-06-12 DIAGNOSIS — R51 Headache: Secondary | ICD-10-CM

## 2018-06-12 DIAGNOSIS — F4321 Adjustment disorder with depressed mood: Secondary | ICD-10-CM

## 2018-06-12 DIAGNOSIS — H04129 Dry eye syndrome of unspecified lacrimal gland: Secondary | ICD-10-CM | POA: Diagnosis not present

## 2018-06-12 DIAGNOSIS — R519 Headache, unspecified: Secondary | ICD-10-CM

## 2018-06-12 DIAGNOSIS — J301 Allergic rhinitis due to pollen: Secondary | ICD-10-CM

## 2018-06-12 DIAGNOSIS — F439 Reaction to severe stress, unspecified: Secondary | ICD-10-CM

## 2018-06-12 NOTE — Progress Notes (Signed)
Migraine x 1 wk. Going to bed and waking with it. She reports that it felt like a sinus HA she took something for it but that didn't help. She reports that the pain is in the top of her head. 3/10 she said that the pain gets worse as the day goes by. She usually takes a nap and this tends to help. The light bothers her. She denies any auras or floaters. She did say that it feels like she has "hairs"in her eyes. But said that's because her eyes are dry. I asked if she has used her eye drops she states that she has and this gives her some relief. She has used a warm compress and ice and said that this helps some. She also reports some nausea and uses soda crackers and ginger ale for this.   Maryruth Eve, Lahoma Crocker, CMA

## 2018-06-12 NOTE — Progress Notes (Signed)
Virtual Visit via Video Note  I connected with Cheryl [name] on 06/12/18 at  9:30 AM EDT by a video enabled telemedicine application and verified that I am speaking with the correct person using two identifiers.   I discussed the limitations of evaluation and management by telemedicine and the availability of in person appointments. The patient expressed understanding and agreed to proceed.  Pt was at home and I was in my office for the virtual visit.     Subjective:    CC: increased anxiety   HPI: 78 year old female complains of increased stress and anxiety.  She is noticed that she is itching all over and in fact had called for a refill of hydroxyzine that she had not used in years.  She really thinks it may be related to increased stress levels.  Her daughter Cheryl [name] who had been sick passed away on June 03, 2022 and they were not able to really be with her except through Skype and so this is been really difficult for her.  She has been trying to stay home and self quarantining for the last 7 weeks.  Has been trying to get out and walk with her dog when it is not raining which does help and she does feel better.  She just feels emotionally stressed.  She reports that she is actually been sleeping really well.  She says she thinks is just because she says tired by the end of the day.  She has had a mild sore throat on and off but thinks that is really from the pollen.  She will notice it more when she has been outside walking the dog.  On days where she stays in it seems to go away.  She has been using her Flonase regularly.  But over the last week or so she has been getting frontal headaches.  Sometimes also on the top of her head.  She says she is tried hot and cold packs.  The ice packs do seem to help some.  She is tried a generic version of Excedrin which does provide some relief.  She does not necessarily wake up with a headache but it seems to build as the day goes on.  No other significant  changes.  She does have dry eye and says she notices that it seems to be worse when she gets a headache.  She has been using prescription Restasis as well as an over-the-counter moisturizing drop.  3/10 she said that the pain gets worse as the day goes by. She usually takes a nap and this tends to help. The light bothers her. She denies any auras or floaters.  No significant nasal congestion, fevers, chills, is or sweats.  She is also had some intermittent nausea but usually will just have a little soda and some crackers and that seems to help.   Past medical history, Surgical history, Family history not pertinant except as noted below, Social history, Allergies, and medications have been entered into the medical record, reviewed, and corrections made.   Review of Systems: No fevers, chills, night sweats, weight loss, chest pain, or shortness of breath.   Objective:    General: Speaking clearly in complete sentences without any shortness of breath.  Alert and oriented x3.  Normal judgment. No apparent acute distress.  Well-groomed.    Impression and Recommendations:   Acute stress/grieving-recently lost her daughter who is been ill for quite some time.  Offered to refer her to 1 of her therapist/counselors.  She declined and says she feels like she has some good support in her friends and her daughter who lives with her.  Offered to also adjust her Paxil but she declined and says she just wants to keep it at the 20 mg.  She is worried it will make her more sleepy or sedated.  Daily headaches for at least the last week-we discussed that we could consider a prednisone taper over 5 days.  She was very nervous about using prednisone and wanted to hold off for now.  Also could be allergy triggered as well as stress triggered.  Continue with Flonase.  She could see if it just seems to improve her pass over the next week or so.  Otherwise no new medication changes etc.  I did encourage her to call back if  she feels like it is turning into a sinus infection at any point.  We did treat her for sinusitis back in February.  Dry eye-using Restasis and over-the-counter saline drops.  Seasonal allergies-continue with Flonase though she says she is not sure how well it is working certainly it is up to her if she wants to discontinue it she certainly can.      I discussed the assessment and treatment plan with the patient. The patient was provided an opportunity to ask questions and all were answered. The patient agreed with the plan and demonstrated an understanding of the instructions.   The patient was advised to call back or seek an in-person evaluation if the symptoms worsen or if the condition fails to improve as anticipated.   Cheryl Lecher, MD

## 2018-06-15 DIAGNOSIS — Z20828 Contact with and (suspected) exposure to other viral communicable diseases: Secondary | ICD-10-CM | POA: Diagnosis not present

## 2018-06-25 ENCOUNTER — Ambulatory Visit: Payer: PPO | Admitting: Family Medicine

## 2018-07-01 ENCOUNTER — Other Ambulatory Visit: Payer: Self-pay | Admitting: Family Medicine

## 2018-07-04 ENCOUNTER — Encounter: Payer: Self-pay | Admitting: Family Medicine

## 2018-07-04 DIAGNOSIS — M542 Cervicalgia: Secondary | ICD-10-CM

## 2018-07-17 ENCOUNTER — Other Ambulatory Visit: Payer: Self-pay

## 2018-07-17 ENCOUNTER — Telehealth: Payer: Self-pay | Admitting: Family Medicine

## 2018-07-17 DIAGNOSIS — R5383 Other fatigue: Secondary | ICD-10-CM

## 2018-07-17 DIAGNOSIS — E785 Hyperlipidemia, unspecified: Secondary | ICD-10-CM

## 2018-07-17 NOTE — Telephone Encounter (Signed)
Faxed order to [facility]  Address: [address], [place], [place] [address]  Phone: [phone] Fax [phone]

## 2018-07-17 NOTE — Telephone Encounter (Signed)
Patient needs lab draw on [address]. WIll need to fax orders over there.  Not sure how to enter for that specific locatin.

## 2018-07-25 ENCOUNTER — Encounter: Payer: Self-pay | Admitting: Physical Therapy

## 2018-07-25 ENCOUNTER — Ambulatory Visit: Payer: PPO | Attending: Family Medicine | Admitting: Physical Therapy

## 2018-07-25 ENCOUNTER — Other Ambulatory Visit: Payer: Self-pay

## 2018-07-25 DIAGNOSIS — M542 Cervicalgia: Secondary | ICD-10-CM | POA: Diagnosis not present

## 2018-07-25 DIAGNOSIS — M6281 Muscle weakness (generalized): Secondary | ICD-10-CM | POA: Diagnosis not present

## 2018-07-25 NOTE — Therapy (Signed)
[facility] [address], [place], [place], [address] Phone: [phone]   Fax:  [phone]  Physical Therapy Evaluation  Patient Details  Name: Cheryl [name] MRN: [id number] Date of Birth: 08/14/1940 Referring Provider (PT): Dr. [name]    Encounter Date: 07/25/2018  PT End of Session - 07/25/18 1153    Visit Number  1    Date for PT Re-Evaluation  09/19/18    Authorization Type  [employer]    PT Start Time  0930    PT Stop Time  1025    PT Time Calculation (min)  55 min    Activity Tolerance  Patient tolerated treatment well    Behavior During Therapy  Anxious   patient states she almost cancelled several times b/c she was nervous about coming      Past Medical History:  Diagnosis Date  . Anemia   . Anxiety    d/t recent family issues  . Arthritis   . Bone spur   . Chronic back pain    stenosis and OA  . Depression    takes Paxil daily  . Diverticulosis   . Dizzy    occasionally  . Fatty liver    has had 2 Hep shots and the final one is in June 18  . GERD (gastroesophageal reflux disease)    takes Omeprazole daily  . Hepatitis 2012  . History of bronchitis    20-25 yrs ago  . History of colon polyps    precancerous  . History of shingles   . Hyperlipidemia    takes Simvastatin daily  . IBS (irritable bowel syndrome)    more on side of constipation-takes Miralax daily  . Insomnia    takes Melatonin and Ambien nightly  . Internal hemorrhoids   . Joint pain   . Joint swelling   . Lung nodules    calcified   . Migraine headache    migraines-thinks coming from neck.Last one1/28/18  . Pneumonia    hx of-20 yrs ago-=-walking  . Renal cyst    left  . Uterine cancer ([place]) 1973    Past Surgical History:  Procedure Laterality Date  . APPENDECTOMY  1966  . cataracts     bilateral  . COLONOSCOPY    . EUS  02/10/2011   Procedure: UPPER ENDOSCOPIC ULTRASOUND (EUS) RADIAL;   Surgeon: Owens Loffler, MD;  Location: [facility];  Service: Endoscopy;  Laterality: N/A;  radial linear   . RECTOCELE REPAIR  2011  . ROTATOR CUFF REPAIR  2009   left  . TONSILLECTOMY    . UPPER GASTROINTESTINAL ENDOSCOPY    . VAGINAL HYSTERECTOMY  1973    There were no vitals filed for this visit.   Subjective Assessment - 07/25/18 0937    Subjective  3 falls outside during the quaratine: backing up and fell backwards.  Bumps into furnture in the house.  The balance has worsened since back surgery.    It's made my neck hurt.  Worsened migraines.  Previous history of neck pain but not surgical candidate per Dr. Ellene Route "too much arthritis".  This is the first time I've been out of my house since March other than driving to pick up groceries from online ordering.  My family said I shouldn't risk it but I need to make this neck feel better.    Pertinent History  Lumbar fusion 2018 Dr. Ellene Route;  depression;  going to the doctor soon to check to  see if iron is low and what is causing dizziness    Limitations  House hold activities    How long can you sit comfortably?  puts pillow behind head in recliner and could sit as long as I want; but in regular chair 5-10 minutes    How long can you walk comfortably?  I can't walk a straight line anymore; 1 mile about 30-45 minutes walk the dog dailiy    Diagnostic tests  arthritis in neck;  Dr. Ellene Route "said too much arthritis, can't do surgery."    Patient Stated Goals  make my neck and shoulders feel better  which contributes to migraines;  Patient does not want to focus on balance at this time    Currently in Pain?  Yes    Pain Score  3     Pain Location  Neck    Pain Orientation  Right;Left    Pain Type  Chronic pain    Pain Radiating Towards  between shoulder blades and base of the head    Pain Frequency  Constant    Aggravating Factors   getting up in the mornings;  night time;  head in protruded position;   looking up and down makes me dizzy     Pain Relieving Factors  massages  in the past but I dont' feel comfortable going now;  ice/heat         [facility] PT Assessment - 07/25/18 0001      Assessment   Medical Diagnosis  cervical pain     Referring Provider (PT)  Dr. [name]     Onset Date/Surgical Date  --   May    Hand Dominance  Right    Next MD Visit  as needed     Prior Therapy  for my neck had traction which helped       Precautions   Precautions  Fall      Restrictions   Weight Bearing Restrictions  No      Balance Screen   Has the patient fallen in the past 6 months  Yes    How many times?  3    Has the patient had a decrease in activity level because of a fear of falling?   Yes    Is the patient reluctant to leave their home because of a fear of falling?   No      Home Environment   Living Environment  Private residence    Living Arrangements  Alone    Available Help at Discharge  Family   daughter lives next door     Prior Function   Level of Independence  Independent with basic ADLs   I have to do my own cleaning even though it hurts   Vocation  Retired    Leisure  hug people; go out to eat and play cards with friends      Observation/Other Assessments   Focus on Therapeutic Outcomes (FOTO)   51% limitation       Posture/Postural Control   Posture/Postural Control  Postural limitations    Postural Limitations  Rounded Shoulders;Forward head;Increased thoracic kyphosis      AROM   Overall AROM Comments  UE ROM WFLs in all planes; reports some left shoulder pain at night     Cervical Flexion  22    Cervical Extension  36    Cervical - Right Side Bend  30    Cervical - Left Side Bend  31  Cervical - Right Rotation  30    Cervical - Left Rotation  32      Strength   Overall Strength Comments  UE grossly WFLs    Cervical Flexion  3+/5    Cervical Extension  3+/5      Palpation   Spinal mobility  Hypomobility cervical, upper and mid thoracic     Palpation comment  Tender points  in right > left upper trap, decreased soft tissue mobility in suboccipitals       Distraction Test   Findngs  Positive    Comment  good relief to both standing and seated cervical distraction                Objective measurements completed on examination: See above findings.              PT Education - 07/25/18 1151    Education Details  Access Code: [email or web address] supine and seated cervical retraction;  seated scapular retractions    Person(s) Educated  Patient    Methods  Explanation;Demonstration;Handout    Comprehension  Returned demonstration;Verbalized understanding       PT Short Term Goals - 07/25/18 1213      PT SHORT TERM GOAL #1   Title  The patient will demonstrate improvement in postural correction and awareness and initial HEP    Time  4    Period  Weeks    Status  New    Target Date  08/22/18      PT SHORT TERM GOAL #2   Title  The patient will report a 25% improvement in neck pain with looking at her phone, in the mornings and with usual ADLs    Time  4    Period  Weeks    Status  New      PT SHORT TERM GOAL #3   Title  The patient will have improved cervical ROM:  flexion 26, extension 40, sidebending and rotation to 30 degrees needed for driving    Time  4    Period  Weeks    Status  New        PT Long Term Goals - 07/25/18 1216      PT LONG TERM GOAL #1   Title  The patient will be independent in safe self progression of HEP and self management    Time  8    Period  Weeks    Status  New    Target Date  09/19/18      PT LONG TERM GOAL #2   Title  The patient will report a 50% improvement in neck pain and near baseline level to perform her meal prep,  cleaning with greater ease    Time  8    Period  Weeks    Status  New      PT LONG TERM GOAL #3   Title  Improved cervical ROM with flexion 32 degrees and rotation to 40 degrees needed for driving    Time  8    Period  Weeks    Status  New      PT LONG TERM GOAL #4   Title   Cervical, scapular and thoracic strength grossly 4-/5 needed for lifting/carrying groceries    Time  8    Period  Weeks    Status  New      PT LONG TERM GOAL #5   Title  FOTO functional outcome score improved from 51% limitation to 43%.  Time  8    Period  Weeks    Status  New             Plan - 07/25/18 1155    Clinical Impression Statement  The patient is a pleasant 78 year old female who has a chronic history of neck pain and migraines recently exacerbated by multiple falls while working in her yard.  She reports dizziness and decreased balance causing her falls and is supposed to have bloodwork soon to help determine the cause.  She does not wish to address balance in PT at this time and wants to focus on pain relief.   She reports her neck pain with aggravated with cervical protrusion, in the mornings and generally with activity.  She complains of stiffness especially with turning her head and dizziness with looking up or down.  Sitting and standing posture with forward head, rounded shoulders.  Shoulder ROM WFLs but very limited cervical ROM in all planes.  Joint hypmobility in cervical, upper and mid thoracic spine.  Decreased muscle lengths and tender points in cervical paraspinals, upper traps and suboccipitals.  Good pain relief with cervical distraction in both sitting and supine.    Personal Factors and Comorbidities  Age;Past/Current Experience;Behavior Pattern;Comorbidity 1;Comorbidity 2;Comorbidity 3+;Other    Comorbidities  lumbar fusion 2018;  osteopenia; multi region OA;  anxiety/depression;  fearfulness regarding being in public during the pandemic    Examination-Activity Limitations  Reach Overhead;Sit;Sleep;Lift    Examination-Participation Restrictions  Meal Prep;Cleaning;Driving;Yard Work    [employer]  Evolving/Moderate complexity    Clinical Decision Making  Moderate    Rehab Potential  Good    PT Frequency  2x / week    PT Duration  8  weeks    PT Treatment/Interventions  ADLs/Self Care Home Management;Electrical Stimulation;Cryotherapy;Ultrasound;Traction;Moist Heat;Neuromuscular re-education;Therapeutic exercise;Therapeutic activities;Patient/family education;Manual techniques;Taping;Dry needling    PT Next Visit Plan  patient interested in trying DN cervical multifidi, upper traps, suboccipitals;  manual therapy including suboccipital release, upper traps soft tissue mob;  good response to cervical mechanical traction in the past;  review cervical and scapular retractions;  start band ex's (she liked doing in previous PT);  reassurance/ relaxation and possible mental imagery education    PT Home Exercise Plan  Access Code: [email or web address]    Recommended Other Services  possible home over the door traction    Consulted and Agree with Plan of Care  Patient       Patient will benefit from skilled therapeutic intervention in order to improve the following deficits and impairments:  Increased fascial restricitons, Increased muscle spasms, Pain, Decreased balance, Hypomobility, Impaired perceived functional ability, Decreased strength, Postural dysfunction  Visit Diagnosis: 1. Cervicalgia   2. Muscle weakness (generalized)        Problem List Patient Active Problem List   Diagnosis Date Noted  . Myofascial pain 03/27/2018  . Numbness of left foot 11/07/2017  . Right foot pain 09/12/2017  . Verruca 09/12/2017  . Deviated septum 01/25/2017  . ETD (Eustachian tube dysfunction), bilateral 01/25/2017  . Seasonal allergic rhinitis 01/25/2017  . Bilateral hearing loss 01/25/2017  . S/P lumbar fusion 06/20/2016  . 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 Fatty liver 12/22/2015  . Onychodystrophy 11/24/2015  . Prepatellar bursitis of right knee 08/18/2014  . Hyperlipidemia 12/17/2013  . Osteoarthritis of left knee 07/05/2013  . Obesity 03/10/2011  . Dyspepsia 02/10/2011  . Pulmonary nodule 12/10/2010  .  PALPITATIONS 02/11/2010  . OSTEOPENIA 11/27/2009  .  CONSTIPATION, CHRONIC 08/15/2008  . NECK PAIN 04/11/2008  . Disorder of kidney and ureter 01/21/2008  . HEMORRHOIDS, INTERNAL 01/16/2008  . DIVERTICULOSIS, COLON 01/16/2008  . POSTMENOPAUSAL STATUS 12/05/2007  . MICROSCOPIC HEMATURIA 06/05/2007  . STENOSIS, LUMBAR SPINE 12/22/2005  . ANXIETY 11/15/2005  . IRRITABLE BOWEL SYNDROME 11/15/2005  . ARTHRITIS 11/15/2005   Ruben Im, PT 07/25/18 12:28 PM Phone: [phone] Fax: [phone] Alvera Singh 07/25/2018, 12:28 PM  [facility] [address] [place], [place], [address] Phone: [phone]   Fax:  [phone]  Name: Cheryl [name] MRN: [id number] Date of Birth: 03-06-1940

## 2018-07-25 NOTE — Patient Instructions (Signed)
Access Code: [email or web address]  URL: [email or web address]  Date: 07/25/2018  Prepared by: Ruben Im   Exercises  Seated Cervical Retraction - 10 reps - 1 sets - 3x daily - 7x weekly  Supine Chin Tuck - 10 reps - 1 sets - 3x daily - 7x weekly  Seated Scapular Retraction - 10 reps - 1 sets - 1x daily - 7x weekly     [facility] [address] [place], [place] [address] Phone # [phone] Fax [phone]

## 2018-07-31 DIAGNOSIS — R5383 Other fatigue: Secondary | ICD-10-CM | POA: Diagnosis not present

## 2018-07-31 DIAGNOSIS — E785 Hyperlipidemia, unspecified: Secondary | ICD-10-CM | POA: Diagnosis not present

## 2018-08-01 ENCOUNTER — Encounter: Payer: Self-pay | Admitting: Family Medicine

## 2018-08-01 ENCOUNTER — Ambulatory Visit: Payer: PPO | Admitting: Physical Therapy

## 2018-08-01 ENCOUNTER — Encounter: Payer: Self-pay | Admitting: Physical Therapy

## 2018-08-01 ENCOUNTER — Other Ambulatory Visit: Payer: Self-pay

## 2018-08-01 DIAGNOSIS — M542 Cervicalgia: Secondary | ICD-10-CM | POA: Diagnosis not present

## 2018-08-01 DIAGNOSIS — M6281 Muscle weakness (generalized): Secondary | ICD-10-CM

## 2018-08-01 LAB — COMPLETE METABOLIC PANEL WITH GFR
AG Ratio: 1.8 (calc) (ref 1.0–2.5)
ALT: 18 U/L (ref 6–29)
AST: 24 U/L (ref 10–35)
Albumin: 4.2 g/dL (ref 3.6–5.1)
Alkaline phosphatase (APISO): 72 U/L (ref 37–153)
BUN: 12 mg/dL (ref 7–25)
CO2: 26 mmol/L (ref 20–32)
Calcium: 9.6 mg/dL (ref 8.6–10.4)
Chloride: 104 mmol/L (ref 98–110)
Creat: 0.82 mg/dL (ref 0.60–0.93)
GFR, Est African American: 81 mL/min/{1.73_m2} (ref >=60)
GFR, Est Non African American: 70 mL/min/{1.73_m2} (ref >=60)
Globulin: 2.3 g/dL (calc) (ref 1.9–3.7)
Glucose, Bld: 103 mg/dL — ABNORMAL HIGH (ref 65–99)
Potassium: 4.3 mmol/L (ref 3.5–5.3)
Sodium: 139 mmol/L (ref 135–146)
Total Bilirubin: 0.4 mg/dL (ref 0.2–1.2)
Total Protein: 6.5 g/dL (ref 6.1–8.1)

## 2018-08-01 LAB — LIPID PANEL W/REFLEX DIRECT LDL
Cholesterol: 224 mg/dL — ABNORMAL HIGH (ref <200)
HDL: 46 mg/dL — ABNORMAL LOW (ref >=50)
LDL Cholesterol (Calc): 147 mg/dL (calc) — ABNORMAL HIGH
Non-HDL Cholesterol (Calc): 178 mg/dL (calc) — ABNORMAL HIGH (ref <130)
Total CHOL/HDL Ratio: 4.9 (calc) (ref <5.0)
Triglycerides: 178 mg/dL — ABNORMAL HIGH (ref <150)

## 2018-08-01 LAB — IRON,TIBC AND FERRITIN PANEL
%SAT: 28 % (calc) (ref 16–45)
Ferritin: 31 ng/mL (ref 16–288)
Iron: 88 ug/dL (ref 45–160)
TIBC: 312 mcg/dL (calc) (ref 250–450)

## 2018-08-01 NOTE — Therapy (Signed)
[facility] [address] [place], [place], [address] Phone: [phone]   Fax:  [phone]  Physical Therapy Treatment  Patient Details  Name: Cheryl [name] MRN: [id number] Date of Birth: [birth year] Referring Provider (PT): Dr. [name]    Encounter Date: 08/01/2018  PT End of Session - 08/01/18 0759    Visit Number  2    Date for PT Re-Evaluation  09/19/18    Authorization Type  [employer]    PT Start Time  0759    PT Stop Time  0845    PT Time Calculation (min)  46 min    Activity Tolerance  Patient tolerated treatment well    Behavior During Therapy  [facility] for tasks assessed/performed       Past Medical History:  Diagnosis Date  . Anemia   . Anxiety    d/t recent family issues  . Arthritis   . Bone spur   . Chronic back pain    stenosis and OA  . Depression    takes Paxil daily  . Diverticulosis   . Dizzy    occasionally  . Fatty liver    has had 2 Hep shots and the final one is in June 18  . GERD (gastroesophageal reflux disease)    takes Omeprazole daily  . Hepatitis 2012  . History of bronchitis    20-25 yrs ago  . History of colon polyps    precancerous  . History of shingles   . Hyperlipidemia    takes Simvastatin daily  . IBS (irritable bowel syndrome)    more on side of constipation-takes Miralax daily  . Insomnia    takes Melatonin and Ambien nightly  . Internal hemorrhoids   . Joint pain   . Joint swelling   . Lung nodules    calcified   . Migraine headache    migraines-thinks coming from neck.Last one1/28/18  . Pneumonia    hx of-20 yrs ago-=-walking  . Renal cyst    left  . Uterine cancer ([place]) 1973    Past Surgical History:  Procedure Laterality Date  . APPENDECTOMY  1966  . cataracts     bilateral  . COLONOSCOPY    . EUS  02/10/2011   Procedure: UPPER ENDOSCOPIC ULTRASOUND (EUS) RADIAL;  Surgeon: Owens Loffler, MD;  Location: [facility];  Service:  Endoscopy;  Laterality: N/A;  radial linear   . RECTOCELE REPAIR  2011  . ROTATOR CUFF REPAIR  2009   left  . TONSILLECTOMY    . UPPER GASTROINTESTINAL ENDOSCOPY    . VAGINAL HYSTERECTOMY  1973    There were no vitals filed for this visit.  Subjective Assessment - 08/01/18 0800    Subjective  Compliant with HEP, they are helping. I have little no pain pain this AM.    Pertinent History  Lumbar fusion 2018 Dr. Ellene Route;  depression;  going to the doctor soon to check to see if iron is low and what is causing dizziness    Limitations  House hold activities    Currently in Pain?  No/denies    Multiple Pain Sites  No         [facility] PT Assessment - 08/01/18 0001      AROM   Cervical - Right Rotation  40    Cervical - Left Rotation  40                   [facility] Adult PT  Treatment/Exercise - 08/01/18 0001      Shoulder Exercises: Seated   Other Seated Exercises  scap squeezes 10x      Shoulder Exercises: Standing   Extension  Strengthening;Both;10 reps;Theraband    Theraband Level (Shoulder Extension)  Level 1 (Yellow)   added to HEP   Row  Strengthening;Both;10 reps;Theraband   added to HEP   Theraband Level (Shoulder Row)  Level 1 (Yellow)      Shoulder Exercises: Pulleys   Flexion  3 minutes    Flexion Limitations  Discussed current status and assessed pain      Manual Therapy   Soft tissue mobilization  Cervical Bil SCM, upper traps, occiput, scalenes bil: passive manual traction intermittently             PT Education - 08/01/18 0840    Education Details  HEP    Person(s) Educated  Patient    Methods  Explanation;Demonstration;Tactile cues;Verbal cues;Handout    Comprehension  Verbalized understanding;Returned demonstration       PT Short Term Goals - 07/25/18 1213      PT SHORT TERM GOAL #1   Title  The patient will demonstrate improvement in postural correction and awareness and initial HEP    Time  4    Period  Weeks    Status  New     Target Date  08/22/18      PT SHORT TERM GOAL #2   Title  The patient will report a 25% improvement in neck pain with looking at her phone, in the mornings and with usual ADLs    Time  4    Period  Weeks    Status  New      PT SHORT TERM GOAL #3   Title  The patient will have improved cervical ROM:  flexion 26, extension 40, sidebending and rotation to 30 degrees needed for driving    Time  4    Period  Weeks    Status  New        PT Long Term Goals - 07/25/18 1216      PT LONG TERM GOAL #1   Title  The patient will be independent in safe self progression of HEP and self management    Time  8    Period  Weeks    Status  New    Target Date  09/19/18      PT LONG TERM GOAL #2   Title  The patient will report a 50% improvement in neck pain and near baseline level to perform her meal prep,  cleaning with greater ease    Time  8    Period  Weeks    Status  New      PT LONG TERM GOAL #3   Title  Improved cervical ROM with flexion 32 degrees and rotation to 40 degrees needed for driving    Time  8    Period  Weeks    Status  New      PT LONG TERM GOAL #4   Title  Cervical, scapular and thoracic strength grossly 4-/5 needed for lifting/carrying groceries    Time  8    Period  Weeks    Status  New      PT LONG TERM GOAL #5   Title  FOTO functional outcome score improved from 51% limitation to 43%.    Time  8    Period  Weeks    Status  New  Plan - 08/01/18 0840    Clinical Impression Statement  Pt arrives to therapy very pleased with her progress just after 1 visit ( eval). She feels the initial HEP has helped tremendously and presents today with no pain. Pt has tightness along her occiput and SCM bilaterally ( LT > RT). Post manual pt could rotate 40 degrees bil at the cervical spine. It should be noted pt spoke about dizziness especially when she rolls to the left. She has questions regarding this and how it may relate to her balance issues. PTA encouraged  pt to speak with her MD about this as this may be something PT can address ( BPPV??) Issued yellow band standing shoulder/back strength for HEP.    Personal Factors and Comorbidities  Age;Past/Current Experience;Behavior Pattern;Comorbidity 1;Comorbidity 2;Comorbidity 3+;Other    Comorbidities  lumbar fusion 2018;  osteopenia; multi region OA;  anxiety/depression;  fearfulness regarding being in public during the pandemic    Examination-Activity Limitations  Reach Overhead;Sit;Sleep;Lift    Examination-Participation Restrictions  Meal Prep;Cleaning;Driving;Yard Work    [employer]  Evolving/Moderate complexity    Rehab Potential  Good    PT Frequency  2x / week    PT Duration  8 weeks    PT Treatment/Interventions  ADLs/Self Care Home Management;Electrical Stimulation;Cryotherapy;Ultrasound;Traction;Moist Heat;Neuromuscular re-education;Therapeutic exercise;Therapeutic activities;Patient/family education;Manual techniques;Taping;Dry needling    PT Next Visit Plan  Postural endurance and strength exercises, review yellow band and progress as appropriate for pt. Manual soft tissue work.    PT Home Exercise Plan  Access Code: [email or web address]    Consulted and Agree with Plan of Care  Patient       Patient will benefit from skilled therapeutic intervention in order to improve the following deficits and impairments:  Increased fascial restricitons, Increased muscle spasms, Pain, Decreased balance, Hypomobility, Impaired perceived functional ability, Decreased strength, Postural dysfunction  Visit Diagnosis: 1. Cervicalgia   2. Muscle weakness (generalized)        Problem List Patient Active Problem List   Diagnosis Date Noted  . Myofascial pain 03/27/2018  . Numbness of left foot 11/07/2017  . Right foot pain 09/12/2017  . Deviated septum 01/25/2017  . ETD (Eustachian tube dysfunction), bilateral 01/25/2017  . Seasonal allergic rhinitis 01/25/2017  . Bilateral hearing  loss 01/25/2017  . S/P lumbar fusion 06/20/2016  . 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 Fatty liver 12/22/2015  . Onychodystrophy 11/24/2015  . Prepatellar bursitis of right knee 08/18/2014  . Hyperlipidemia 12/17/2013  . Osteoarthritis of left knee 07/05/2013  . Obesity 03/10/2011  . Dyspepsia 02/10/2011  . Pulmonary nodule 12/10/2010  . PALPITATIONS 02/11/2010  . OSTEOPENIA 11/27/2009  . CONSTIPATION, CHRONIC 08/15/2008  . NECK PAIN 04/11/2008  . Disorder of kidney and ureter 01/21/2008  . HEMORRHOIDS, INTERNAL 01/16/2008  . DIVERTICULOSIS, COLON 01/16/2008  . POSTMENOPAUSAL STATUS 12/05/2007  . MICROSCOPIC HEMATURIA 06/05/2007  . STENOSIS, LUMBAR SPINE 12/22/2005  . ANXIETY 11/15/2005  . IRRITABLE BOWEL SYNDROME 11/15/2005  . ARTHRITIS 11/15/2005    COCHRAN,JENNIFER, PTA 08/01/2018, 9:02 AM  [facility] [address], [place], [place], [address] Phone: [phone]   Fax:  [phone]  Name: Cheryl [name] MRN: [id number] Date of Birth: 1940/05/27  Access Code: [email or web address]  URL: [email or web address]  Date: 08/01/2018  Prepared by: Myrene Galas   Exercises  Seated Cervical Retraction - 10 reps - 1 sets - 3x daily - 7x weekly  Supine Chin Tuck - 10 reps - 1 sets -  3x daily - 7x weekly  Seated Scapular Retraction - 10 reps - 1 sets - 1x daily - 7x weekly  Standing Shoulder Extension with Resistance - 10 reps - 2 sets - 2x daily - 7x weekly  Standing Bilateral Low Shoulder Row with Anchored Resistance - 10 reps - 2 sets - 2x daily - 7x weekly

## 2018-08-03 ENCOUNTER — Encounter: Payer: Self-pay | Admitting: Physical Therapy

## 2018-08-03 ENCOUNTER — Telehealth: Payer: Self-pay

## 2018-08-03 ENCOUNTER — Other Ambulatory Visit: Payer: Self-pay

## 2018-08-03 ENCOUNTER — Ambulatory Visit: Payer: PPO | Admitting: Physical Therapy

## 2018-08-03 DIAGNOSIS — M6281 Muscle weakness (generalized): Secondary | ICD-10-CM

## 2018-08-03 DIAGNOSIS — M542 Cervicalgia: Secondary | ICD-10-CM

## 2018-08-03 DIAGNOSIS — H8110 Benign paroxysmal vertigo, unspecified ear: Secondary | ICD-10-CM

## 2018-08-03 NOTE — Telephone Encounter (Signed)
Cheryl [name] called and states the rehab place thinks she my have BPPV. They advised her to call the office to have [name] send a referral for an evaluation for BPPV.   [facility], [place]

## 2018-08-03 NOTE — Therapy (Signed)
[facility] [address] [place], [place], [address] Phone: [phone]   Fax:  [phone]  Physical Therapy Treatment  Patient Details  Name: Cheryl [name] MRN: [id number] Date of Birth: 06-07-1940 Referring Provider (PT): Dr. [name]    Encounter Date: 08/03/2018  PT End of Session - 08/03/18 0930    Visit Number  3    Date for PT Re-Evaluation  09/19/18    PT Start Time  0930    PT Stop Time  1022    PT Time Calculation (min)  52 min    Activity Tolerance  Patient tolerated treatment well    Behavior During Therapy  [facility] for tasks assessed/performed       Past Medical History:  Diagnosis Date  . Anemia   . Anxiety    d/t recent family issues  . Arthritis   . Bone spur   . Chronic back pain    stenosis and OA  . Depression    takes Paxil daily  . Diverticulosis   . Dizzy    occasionally  . Fatty liver    has had 2 Hep shots and the final one is in June 18  . GERD (gastroesophageal reflux disease)    takes Omeprazole daily  . Hepatitis 2012  . History of bronchitis    20-25 yrs ago  . History of colon polyps    precancerous  . History of shingles   . Hyperlipidemia    takes Simvastatin daily  . IBS (irritable bowel syndrome)    more on side of constipation-takes Miralax daily  . Insomnia    takes Melatonin and Ambien nightly  . Internal hemorrhoids   . Joint pain   . Joint swelling   . Lung nodules    calcified   . Migraine headache    migraines-thinks coming from neck.Last one1/28/18  . Pneumonia    hx of-20 yrs ago-=-walking  . Renal cyst    left  . Uterine cancer ([place]) 1973    Past Surgical History:  Procedure Laterality Date  . APPENDECTOMY  1966  . cataracts     bilateral  . COLONOSCOPY    . EUS  02/10/2011   Procedure: UPPER ENDOSCOPIC ULTRASOUND (EUS) RADIAL;  Surgeon: Owens Loffler, MD;  Location: [facility];  Service: Endoscopy;  Laterality: N/A;  radial  linear   . RECTOCELE REPAIR  2011  . ROTATOR CUFF REPAIR  2009   left  . TONSILLECTOMY    . UPPER GASTROINTESTINAL ENDOSCOPY    . VAGINAL HYSTERECTOMY  1973    There were no vitals filed for this visit.  Subjective Assessment - 08/03/18 0934    Subjective  Pt wants to know if we are going to do the traction today.  She states she is sore    Pertinent History  Lumbar fusion 2018 Dr. Ellene Route;  depression;  going to the doctor soon to check to see if iron is low and what is causing dizziness    Currently in Pain?  Yes    Pain Score  3     Pain Location  Neck                       [facility] Adult PT Treatment/Exercise - 08/03/18 0001      Shoulder Exercises: Seated   Other Seated Exercises  upper trap stretch      Shoulder Exercises: Standing   Extension  Strengthening;Both;10 reps;Theraband  Theraband Level (Shoulder Extension)  Level 3 (Green)    Row  Strengthening;Both;10 reps;Theraband   added to HEP   Theraband Level (Shoulder Row)  Level 3 (Green)      Shoulder Exercises: ROM/Strengthening   UBE (Upper Arm Bike)  L1 3x3 fwd/back      Manual Therapy   Soft tissue mobilization  Cervical Bil SCM, upper traps, occiput, scalenes bil: passive manual traction intermittently               PT Short Term Goals - 08/03/18 1027      PT SHORT TERM GOAL #1   Title  The patient will demonstrate improvement in postural correction and awareness and initial HEP    Status  On-going      PT SHORT TERM GOAL #2   Title  The patient will report a 25% improvement in neck pain with looking at her phone, in the mornings and with usual ADLs    Status  On-going      PT SHORT TERM GOAL #3   Title  The patient will have improved cervical ROM:  flexion 26, extension 40, sidebending and rotation to 30 degrees needed for driving    Baseline  rotation is 40 bilat    Status  On-going        PT Long Term Goals - 07/25/18 1216      PT LONG TERM GOAL #1   Title  The  patient will be independent in safe self progression of HEP and self management    Time  8    Period  Weeks    Status  New    Target Date  09/19/18      PT LONG TERM GOAL #2   Title  The patient will report a 50% improvement in neck pain and near baseline level to perform her meal prep,  cleaning with greater ease    Time  8    Period  Weeks    Status  New      PT LONG TERM GOAL #3   Title  Improved cervical ROM with flexion 32 degrees and rotation to 40 degrees needed for driving    Time  8    Period  Weeks    Status  New      PT LONG TERM GOAL #4   Title  Cervical, scapular and thoracic strength grossly 4-/5 needed for lifting/carrying groceries    Time  8    Period  Weeks    Status  New      PT LONG TERM GOAL #5   Title  FOTO functional outcome score improved from 51% limitation to 43%.    Time  8    Period  Weeks    Status  New            Plan - 08/03/18 1022    Clinical Impression Statement  Pt was able to add resistance to some of the band exercises as she thought the yellow didn't feel like anything.  Pt needed some cues to prevent shoulder elevation when using the green band. PT discussed BPPV again with patient and it does sound like it may be an issue based on her symptoms. Pt felt less tension when sitting with towel roll behind her back.  She responded well to manual therapy and will benefit from skilled PT to work on improvements in posutre to reduce shoulder tension.    PT Treatment/Interventions  ADLs/Self Care Home Management;Electrical Stimulation;Cryotherapy;Ultrasound;Traction;Moist Heat;Neuromuscular re-education;Therapeutic  exercise;Therapeutic activities;Patient/family education;Manual techniques;Taping;Dry needling    PT Next Visit Plan  Progress as able Postural endurance and strength exercises, review sitting posture and f/u on if she spoke to doctor, Manual soft tissue work.    PT Home Exercise Plan  Access Code: [email or web address]    Consulted and Agree with  Plan of Care  Patient       Patient will benefit from skilled therapeutic intervention in order to improve the following deficits and impairments:  Increased fascial restricitons, Increased muscle spasms, Pain, Decreased balance, Hypomobility, Impaired perceived functional ability, Decreased strength, Postural dysfunction  Visit Diagnosis: 1. Cervicalgia   2. Muscle weakness (generalized)        Problem List Patient Active Problem List   Diagnosis Date Noted  . Myofascial pain 03/27/2018  . Numbness of left foot 11/07/2017  . Right foot pain 09/12/2017  . Deviated septum 01/25/2017  . ETD (Eustachian tube dysfunction), bilateral 01/25/2017  . Seasonal allergic rhinitis 01/25/2017  . Bilateral hearing loss 01/25/2017  . S/P lumbar fusion 06/20/2016  . 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 Fatty liver 12/22/2015  . Onychodystrophy 11/24/2015  . Prepatellar bursitis of right knee 08/18/2014  . Hyperlipidemia 12/17/2013  . Osteoarthritis of left knee 07/05/2013  . Obesity 03/10/2011  . Dyspepsia 02/10/2011  . Pulmonary nodule 12/10/2010  . PALPITATIONS 02/11/2010  . OSTEOPENIA 11/27/2009  . CONSTIPATION, CHRONIC 08/15/2008  . NECK PAIN 04/11/2008  . Disorder of kidney and ureter 01/21/2008  . HEMORRHOIDS, INTERNAL 01/16/2008  . DIVERTICULOSIS, COLON 01/16/2008  . POSTMENOPAUSAL STATUS 12/05/2007  . MICROSCOPIC HEMATURIA 06/05/2007  . STENOSIS, LUMBAR SPINE 12/22/2005  . ANXIETY 11/15/2005  . IRRITABLE BOWEL SYNDROME 11/15/2005  . ARTHRITIS 11/15/2005    Jule Ser, PT 08/03/2018, 10:27 AM  [facility] [address], [place], [place], [address] Phone: [phone]   Fax:  [phone]  Name: Cheryl [name] MRN: [id number] Date of Birth: Oct 27, 1940

## 2018-08-06 ENCOUNTER — Ambulatory Visit: Payer: PPO | Admitting: Physical Therapy

## 2018-08-06 ENCOUNTER — Other Ambulatory Visit: Payer: Self-pay

## 2018-08-06 DIAGNOSIS — M542 Cervicalgia: Secondary | ICD-10-CM | POA: Diagnosis not present

## 2018-08-06 DIAGNOSIS — M6281 Muscle weakness (generalized): Secondary | ICD-10-CM

## 2018-08-06 NOTE — Telephone Encounter (Signed)
Order placed.Cheryl [name], [place]

## 2018-08-06 NOTE — Telephone Encounter (Signed)
OK to place order.

## 2018-08-06 NOTE — Therapy (Signed)
[facility] [address] [place], [place], [address] Phone: [phone]   Fax:  [phone]  Physical Therapy Treatment  Patient Details  Name: Cheryl [name] MRN: [id number] Date of Birth: [birth year] Referring Provider (PT): Dr. [name]    Encounter Date: 08/06/2018  PT End of Session - 08/06/18 1116    Visit Number  4    Date for PT Re-Evaluation  09/19/18    Authorization Type  [employer]    PT Start Time  0826    PT Stop Time  0922   dry needling/heat   PT Time Calculation (min)  56 min    Activity Tolerance  Patient tolerated treatment well       Past Medical History:  Diagnosis Date  . Anemia   . Anxiety    d/t recent family issues  . Arthritis   . Bone spur   . Chronic back pain    stenosis and OA  . Depression    takes Paxil daily  . Diverticulosis   . Dizzy    occasionally  . Fatty liver    has had 2 Hep shots and the final one is in June 18  . GERD (gastroesophageal reflux disease)    takes Omeprazole daily  . Hepatitis 2012  . History of bronchitis    20-25 yrs ago  . History of colon polyps    precancerous  . History of shingles   . Hyperlipidemia    takes Simvastatin daily  . IBS (irritable bowel syndrome)    more on side of constipation-takes Miralax daily  . Insomnia    takes Melatonin and Ambien nightly  . Internal hemorrhoids   . Joint pain   . Joint swelling   . Lung nodules    calcified   . Migraine headache    migraines-thinks coming from neck.Last one1/28/18  . Pneumonia    hx of-20 yrs ago-=-walking  . Renal cyst    left  . Uterine cancer ([place]) 1973    Past Surgical History:  Procedure Laterality Date  . APPENDECTOMY  1966  . cataracts     bilateral  . COLONOSCOPY    . EUS  02/10/2011   Procedure: UPPER ENDOSCOPIC ULTRASOUND (EUS) RADIAL;  Surgeon: Owens Loffler, MD;  Location: [facility];  Service: Endoscopy;  Laterality: N/A;  radial linear    . RECTOCELE REPAIR  2011  . ROTATOR CUFF REPAIR  2009   left  . TONSILLECTOMY    . UPPER GASTROINTESTINAL ENDOSCOPY    . VAGINAL HYSTERECTOMY  1973    There were no vitals filed for this visit.  Subjective Assessment - 08/06/18 0827    Subjective  A little sore. The main pain is better.  I have a new pain in left lower head behind ear.  Still having dizziness and awaiting MD referral for BPPV PT treatment.  Patient reluctant to try DN b/c her daughter told her it "really hurt."    Pertinent History  Lumbar fusion 2018 Dr. Ellene Route;  depression;  going to the doctor soon to check to see if iron is low and what is causing dizziness    Diagnostic tests  arthritis in neck;  Dr. Ellene Route "said too much arthritis, can't do surgery."    Currently in Pain?  Yes    Pain Score  2     Pain Location  Neck    Pain Orientation  Right;Left    Pain Type  Chronic pain  [facility] PT Treatment/Exercise - 08/06/18 0001      Therapeutic Activites    Therapeutic Activities  Other Therapeutic Activities    Other Therapeutic Activities  discussed using her home TENS on cervical musculature posteriorly       Neck Exercises: Machines for Strengthening   UBE (Upper Arm Bike)  6 min L1 3 forward 3 backward   while discussing status     Neck Exercises: Seated   Other Seated Exercise  levator scap stretch 3x    Other Seated Exercise  cervical nods 10x      Neck Exercises: Supine   Capital Flexion  10 reps      Modalities   Modalities  Moist Heat      Moist Heat Therapy   Number Minutes Moist Heat  5 Minutes    Moist Heat Location  Cervical      Manual Therapy   Manual therapy comments  right uppper trap contract relax 3x 5 sec holds    Soft tissue mobilization  bil cervical paraspinals, upper traps    Myofascial Release  suboccipital release 3x 30 sec    Manual Traction  5x 15 sec holds       Trigger Point Dry Needling - 08/06/18 0001    Consent Given?   Yes    Education Handout Provided  Yes    Muscles Treated Head and Neck  Upper trapezius;Oblique capitus;Suboccipitals;Levator scapulae;Cervical multifidi    Muscles Treated Upper Quadrant  Subscapularis    Other Dry Needling  bil suboccipitals    Upper Trapezius Response  Twitch reponse elicited;Palpable increased muscle length    Levator Scapulae Response  Palpable increased muscle length    Cervical multifidi Response  Palpable increased muscle length    Subscapularis Response  Palpable increased muscle length           PT Education - 08/06/18 0923    Education Details  dry needling aftercare;  levator scap stretch; supine and seated cervical nods    Person(s) Educated  Patient    Methods  Explanation;Demonstration;Handout    Comprehension  Returned demonstration;Verbalized understanding       PT Short Term Goals - 08/03/18 1027      PT SHORT TERM GOAL #1   Title  The patient will demonstrate improvement in postural correction and awareness and initial HEP    Status  On-going      PT SHORT TERM GOAL #2   Title  The patient will report a 25% improvement in neck pain with looking at her phone, in the mornings and with usual ADLs    Status  On-going      PT SHORT TERM GOAL #3   Title  The patient will have improved cervical ROM:  flexion 26, extension 40, sidebending and rotation to 30 degrees needed for driving    Baseline  rotation is 40 bilat    Status  On-going        PT Long Term Goals - 07/25/18 1216      PT LONG TERM GOAL #1   Title  The patient will be independent in safe self progression of HEP and self management    Time  8    Period  Weeks    Status  New    [place] Date  09/19/18      PT LONG TERM GOAL #2   Title  The patient will report a 50% improvement in neck pain and near baseline level to perform her meal  prep,  cleaning with greater ease    Time  8    Period  Weeks    Status  New      PT LONG TERM GOAL #3   Title  Improved cervical ROM with  flexion 32 degrees and rotation to 40 degrees needed for driving    Time  8    Period  Weeks    Status  New      PT LONG TERM GOAL #4   Title  Cervical, scapular and thoracic strength grossly 4-/5 needed for lifting/carrying groceries    Time  8    Period  Weeks    Status  New      PT LONG TERM GOAL #5   Title  FOTO functional outcome score improved from 51% limitation to 43%.    Time  8    Period  Weeks    Status  New            Plan - 08/06/18 0905    Clinical Impression Statement  The patient's primary complaint today is upper cervical region pain but also general tightness in upper trapezius region as well.  Right > left tender points noted.  Following manual therapy and DN, much improved soft tissue lengths noted.  Encouraged regular compliance with current HEP and added addition upper cervical spine flexion exercises to complement today's focus areas of treatment.  The patient reports the DN "wasn't nearly as bad as I expected".   Therapist closely monitoring response with all interventions.  Awaiting additional order from the doctor for BPPV affecting left side rolling.    Comorbidities  lumbar fusion 2018;  osteopenia; multi region OA;  anxiety/depression;  fearfulness regarding being in public during the pandemic    Rehab Potential  Good    PT Frequency  2x / week    PT Duration  8 weeks    PT Treatment/Interventions  ADLs/Self Care Home Management;Electrical Stimulation;Cryotherapy;Ultrasound;Traction;Moist Heat;Neuromuscular re-education;Therapeutic exercise;Therapeutic activities;Patient/family education;Manual techniques;Taping;Dry needling    PT Next Visit Plan  assess response to DN #1;  manual soft tissue work; recheck cervical ROM;   posture and strength ex's;  evaluate and treat for BPPV when PT order received    PT Home Exercise Plan  Access Code: [email or web address]       Patient will benefit from skilled therapeutic intervention in order to improve the following deficits  and impairments:  Increased fascial restricitons, Increased muscle spasms, Pain, Decreased balance, Hypomobility, Impaired perceived functional ability, Decreased strength, Postural dysfunction  Visit Diagnosis: 1. Cervicalgia   2. Muscle weakness (generalized)        Problem List Patient Active Problem List   Diagnosis Date Noted  . Myofascial pain 03/27/2018  . Numbness of left foot 11/07/2017  . Right foot pain 09/12/2017  . Deviated septum 01/25/2017  . ETD (Eustachian tube dysfunction), bilateral 01/25/2017  . Seasonal allergic rhinitis 01/25/2017  . Bilateral hearing loss 01/25/2017  . S/P lumbar fusion 06/20/2016  . 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 Fatty liver 12/22/2015  . Onychodystrophy 11/24/2015  . Prepatellar bursitis of right knee 08/18/2014  . Hyperlipidemia 12/17/2013  . Osteoarthritis of left knee 07/05/2013  . Obesity 03/10/2011  . Dyspepsia 02/10/2011  . Pulmonary nodule 12/10/2010  . PALPITATIONS 02/11/2010  . OSTEOPENIA 11/27/2009  . CONSTIPATION, CHRONIC 08/15/2008  . NECK PAIN 04/11/2008  . Disorder of kidney and ureter 01/21/2008  . HEMORRHOIDS, INTERNAL 01/16/2008  . DIVERTICULOSIS, COLON 01/16/2008  . POSTMENOPAUSAL STATUS 12/05/2007  .  MICROSCOPIC HEMATURIA 06/05/2007  . STENOSIS, LUMBAR SPINE 12/22/2005  . ANXIETY 11/15/2005  . IRRITABLE BOWEL SYNDROME 11/15/2005  . ARTHRITIS 11/15/2005   Ruben Im, PT 08/06/18 11:27 AM Phone: [phone] Fax: [phone] Alvera Singh 08/06/2018, 11:27 AM  [facility] [address] [place], [place], [address] Phone: [phone]   Fax:  [phone]  Name: Cheryl [name] MRN: [id number] Date of Birth: 12-26-1940

## 2018-08-06 NOTE — Patient Instructions (Addendum)
Access Code: [email or web address]  URL: [email or web address]  Date: 08/06/2018  Prepared by: Ruben Im   Exercises  Seated Cervical Retraction - 10 reps - 1 sets - 3x daily - 7x weekly  Supine Chin Tuck - 10 reps - 1 sets - 3x daily - 7x weekly  Seated Scapular Retraction - 10 reps - 1 sets - 1x daily - 7x weekly  Standing Shoulder Extension with Resistance - 10 reps - 2 sets - 2x daily - 7x weekly  Standing Bilateral Low Shoulder Row with Anchored Resistance - 10 reps - 2 sets - 2x daily - 7x weekly  Seated Upper Trap Stretch - 10 reps - 1 sets - 10 seconds hold - 1x daily - 7x weekly  Gentle Levator Scapulae Stretch - 3 reps - 1 sets - 30 hold - 1x daily - 7x weekly  Beginner Head Nod - 10 reps - 1 sets - 1x daily - 7x weekly  Seated Deep Neck Flexor Nods - 10 reps - 1 sets - 1x daily - 7x weekly     Trigger Point Dry Needling  . What is Trigger Point Dry Needling (DN)? o DN is a physical therapy technique used to treat muscle pain and dysfunction. Specifically, DN helps deactivate muscle trigger points (muscle knots).  o A thin filiform needle is used to penetrate the skin and stimulate the underlying trigger point. The goal is for a local twitch response (LTR) to occur and for the trigger point to relax. No medication of any kind is injected during the procedure.   . What Does Trigger Point Dry Needling Feel Like?  o The procedure feels different for each individual patient. Some patients report that they do not actually feel the needle enter the skin and overall the process is not painful. Very mild bleeding may occur. However, many patients feel a deep cramping in the muscle in which the needle was inserted. This is the local twitch response.   Marland Kitchen How Will I feel after the treatment? o Soreness is normal, and the onset of soreness may not occur for a few hours. Typically this soreness does not last longer than two days.  o Bruising is uncommon, however; ice can be used to  decrease any possible bruising.  o In rare cases feeling tired or nauseous after the treatment is normal. In addition, your symptoms may get worse before they get better, this period will typically not last longer than 24 hours.   . What Can I do After My Treatment? o Increase your hydration by drinking more water for the next 24 hours. o You may place ice or heat on the areas treated that have become sore, however, do not use heat on inflamed or bruised areas. Heat often brings more relief post needling. o You can continue your regular activities, but vigorous activity is not recommended initially after the treatment for 24 hours. o DN is best combined with other physical therapy such as strengthening, stretching, and other therapies.    Ruben Im PT [facility] [address], [place], [place] [address] Phone # [phone] Fax [phone]

## 2018-08-08 ENCOUNTER — Encounter: Payer: Self-pay | Admitting: Physical Therapy

## 2018-08-08 ENCOUNTER — Ambulatory Visit: Payer: PPO | Attending: Family Medicine | Admitting: Physical Therapy

## 2018-08-08 ENCOUNTER — Other Ambulatory Visit: Payer: Self-pay

## 2018-08-08 DIAGNOSIS — R2689 Other abnormalities of gait and mobility: Secondary | ICD-10-CM | POA: Diagnosis not present

## 2018-08-08 DIAGNOSIS — M542 Cervicalgia: Secondary | ICD-10-CM | POA: Insufficient documentation

## 2018-08-08 DIAGNOSIS — R2681 Unsteadiness on feet: Secondary | ICD-10-CM | POA: Insufficient documentation

## 2018-08-08 DIAGNOSIS — M6281 Muscle weakness (generalized): Secondary | ICD-10-CM | POA: Diagnosis not present

## 2018-08-08 DIAGNOSIS — R42 Dizziness and giddiness: Secondary | ICD-10-CM | POA: Insufficient documentation

## 2018-08-08 NOTE — Patient Instructions (Signed)
Posture Tips DO: - stand tall and erect - keep chin tucked in - keep head and shoulders in alignment - check posture regularly in mirror or large window - pull head back against headrest in car seat;  Change your position often.  Sit with lumbar support. DON'T: - slouch or slump while watching TV or reading - sit, stand or lie in one position  for too long;  Sitting is especially hard on the spine so if you sit at a desk/use the computer, then stand up often!   Copyright  VHI. All rights reserved.  Posture - Standing   Good posture is important. Avoid slouching and forward head thrust. Maintain curve in low back and align ears over shoul- ders, hips over ankles.  Pull your belly button in toward your back bone.   Copyright  VHI. All rights reserved.  Posture - Sitting   Sit upright, head facing forward. Try using a roll to support lower back. Keep shoulders relaxed, and avoid rounded back. Keep hips level with knees. Avoid crossing legs for long periods.   Copyright  VHI. All rights reserved.     Lifting Principles  .Maintain proper posture and head alignment. .Slide object as close as possible before lifting. .Move obstacles out of the way. .Test before lifting; ask for help if too heavy. .Tighten stomach muscles without holding breath. .Use smooth movements; do not jerk. .Use legs to do the work, and pivot with feet. .Distribute the work load symmetrically and close to the center of trunk. .Push instead of pull whenever possible.   Squat down and hold basket close to stand. Use leg muscles to do the work.    Avoid twisting or bending back. Pivot around using foot movements, and bend at knees if needed when reaching for articles.        Getting Into / Out of Bed   Lower self to lie down on one side by raising legs and lowering head at the same time. Use arms to assist moving without twisting. Bend both knees to roll onto back if desired. To sit up, start from  lying on side, and use same move-ments in reverse. Keep trunk aligned with legs.    Shift weight from front foot to back foot as item is lifted off shelf.    When leaning forward to pick object up from floor, extend one leg out behind. Keep back straight. Hold onto a sturdy support with other hand.      Sit upright, head facing forward. Try using a roll to support lower back. Keep shoulders relaxed, and avoid rounded back. Keep hips level with knees. Avoid crossing legs for long periods.     Sleeping on Back  Place pillow under knees. A pillow with cervical support and a roll around waist are also helpful. Copyright  VHI. All rights reserved.  Sleeping on Side Place pillow between knees. Use cervical support under neck and a roll around waist as needed. Copyright  VHI. All rights reserved.   Sleeping on Stomach   If this is the only desirable sleeping position, place pillow under lower legs, and under stomach or chest as needed.  Posture - Sitting   Sit upright, head facing forward. Try using a roll to support lower back. Keep shoulders relaxed, and avoid rounded back. Keep hips level with knees. Avoid crossing legs for long periods. Stand to Sit / Sit to Stand   To sit: Bend knees to lower self onto front edge of chair,  then scoot back on seat. To stand: Reverse sequence by placing one foot forward, and scoot to front of seat. Use rocking motion to stand up.   Work Height and Reach  Ideal work height is no more than 2 to 4 inches below elbow level when standing, and at elbow level when sitting. Reaching should be limited to arm's length, with elbows slightly bent.  Bending  Bend at hips and knees, not back. Keep feet shoulder-width apart.    Posture - Standing   Good posture is important. Avoid slouching and forward head thrust. Maintain curve in low back and align ears over shoul- ders, hips over ankles.  Alternating Positions   Alternate tasks and change  positions frequently to reduce fatigue and muscle tension. Take rest breaks. Computer Work   Position work to Programmer, multimedia. Use proper work and seat height. Keep shoulders back and down, wrists straight, and elbows at right angles. Use chair that provides full back support. Add footrest and lumbar roll as needed.  Getting Into / Out of Car  Lower self onto seat, scoot back, then bring in one leg at a time. Reverse sequence to get out.  Dressing  Lie on back to pull socks or slacks over feet, or sit and bend leg while keeping back straight.    Housework - Sink  Place one foot on ledge of cabinet under sink when standing at sink for prolonged periods.   Pushing / Pulling  Pushing is preferable to pulling. Keep back in proper alignment, and use leg muscles to do the work.  Deep Squat   Squat and lift with both arms held against upper trunk. Tighten stomach muscles without holding breath. Use smooth movements to avoid jerking.  Avoid Twisting   Avoid twisting or bending back. Pivot around using foot movements, and bend at knees if needed when reaching for articles.  Carrying Luggage   Distribute weight evenly on both sides. Use a cart whenever possible. Do not twist trunk. Move body as a unit.   Lifting Principles .Maintain proper posture and head alignment. .Slide object as close as possible before lifting. .Move obstacles out of the way. .Test before lifting; ask for help if too heavy. .Tighten stomach muscles without holding breath. .Use smooth movements; do not jerk. .Use legs to do the work, and pivot with feet. .Distribute the work load symmetrically and close to the center of trunk. .Push instead of pull whenever possible.   Ask For Help   Ask for help and delegate to others when possible. Coordinate your movements when lifting together, and maintain the low back curve.  Log Roll   Lying on back, bend left knee and place left arm across chest. Roll all in  one movement to the right. Reverse to roll to the left. Always move as one unit. Housework - Sweeping  Use long-handled equipment to avoid stooping.   Housework - Wiping  Position yourself as close as possible to reach work surface. Avoid straining your back.  Laundry - Unloading Wash   To unload small items at bottom of washer, lift leg opposite to arm being used to reach.  [place] close to area to be raked. Use arm movements to do the work. Keep back straight and avoid twisting.     Cart  When reaching into cart with one arm, lift opposite leg to keep back straight.   Getting Into / Out of Bed  Lower self to lie down on one  side by raising legs and lowering head at the same time. Use arms to assist moving without twisting. Bend both knees to roll onto back if desired. To sit up, start from lying on side, and use same move-ments in reverse. Housework - Vacuuming  Hold the vacuum with arm held at side. Step back and forth to move it, keeping head up. Avoid twisting.   Laundry - IT consultant so that bending and twisting can be avoided.   Laundry - Unloading Dryer  Squat down to reach into clothes dryer or use a reacher.  Gardening - Weeding / Probation officer or Kneel. Knee pads may be helpful.

## 2018-08-08 NOTE — Therapy (Signed)
[facility] [address], [place], [place], [address] Phone: [phone]   Fax:  [phone]  Physical Therapy Treatment  Patient Details  Name: Cheryl [name] MRN: [id number] Date of Birth: 12-Jun-1940 Referring Provider (PT): Dr. [name]    Encounter Date: 08/08/2018  PT End of Session - 08/08/18 0911    Visit Number  5    Date for PT Re-Evaluation  09/19/18    Authorization Type  [employer]    PT Start Time  0900    PT Stop Time  0949    PT Time Calculation (min)  49 min    Activity Tolerance  Patient tolerated treatment well    Behavior During Therapy  [facility] for tasks assessed/performed       Past Medical History:  Diagnosis Date  . Anemia   . Anxiety    d/t recent family issues  . Arthritis   . Bone spur   . Chronic back pain    stenosis and OA  . Depression    takes Paxil daily  . Diverticulosis   . Dizzy    occasionally  . Fatty liver    has had 2 Hep shots and the final one is in June 18  . GERD (gastroesophageal reflux disease)    takes Omeprazole daily  . Hepatitis 2012  . History of bronchitis    20-25 yrs ago  . History of colon polyps    precancerous  . History of shingles   . Hyperlipidemia    takes Simvastatin daily  . IBS (irritable bowel syndrome)    more on side of constipation-takes Miralax daily  . Insomnia    takes Melatonin and Ambien nightly  . Internal hemorrhoids   . Joint pain   . Joint swelling   . Lung nodules    calcified   . Migraine headache    migraines-thinks coming from neck.Last one1/28/18  . Pneumonia    hx of-20 yrs ago-=-walking  . Renal cyst    left  . Uterine cancer ([place]) 1973    Past Surgical History:  Procedure Laterality Date  . APPENDECTOMY  1966  . cataracts     bilateral  . COLONOSCOPY    . EUS  02/10/2011   Procedure: UPPER ENDOSCOPIC ULTRASOUND (EUS) RADIAL;  Surgeon: Owens Loffler, MD;  Location: [facility];  Service:  Endoscopy;  Laterality: N/A;  radial linear   . RECTOCELE REPAIR  2011  . ROTATOR CUFF REPAIR  2009   left  . TONSILLECTOMY    . UPPER GASTROINTESTINAL ENDOSCOPY    . VAGINAL HYSTERECTOMY  1973    There were no vitals filed for this visit.  Subjective Assessment - 08/08/18 0916    Subjective  The DN was excellent! My neck has never felt so good!    Pertinent History  Lumbar fusion 2018 Dr. Ellene Route;  depression;  going to the doctor soon to check to see if iron is low and what is causing dizziness    Currently in Pain?  No/denies    Multiple Pain Sites  No                       [facility] Adult PT Treatment/Exercise - 08/08/18 0001      Self-Care   Self-Care  Posture    Posture  Cervical and lumbar protective body mechanics for ADLS      Neck Exercises: Machines for Strengthening   UBE (Upper Arm Bike)  L2 2x2 for tisue prep and review of current status      Shoulder Exercises: Seated   Other Seated Exercises  3 way  shoulder raise 1# 10x each BIl      Shoulder Exercises: Standing   Extension  Strengthening;Both;15 reps;Theraband    Theraband Level (Shoulder Extension)  Level 3 (Green)    Row  Strengthening;Both;15 reps;Theraband    Theraband Level (Shoulder Row)  Level 3 (Green)      Moist Heat Therapy   Moist Heat Location  Cervical   during soft tissue work     Manual Therapy   Soft tissue mobilization  Cervical Bil SCM, upper traps, occiput, scalenes bil: passive manual traction intermittently             PT Education - 08/08/18 0938    Education Details  Cervical and lumbar protective body mechanics for ADLs    Person(s) Educated  Patient    Methods  Explanation;Demonstration;Handout;Verbal cues       PT Short Term Goals - 08/08/18 0917      PT SHORT TERM GOAL #1   Title  The patient will demonstrate improvement in postural correction and awareness and initial HEP    Time  4    Period  Weeks    Status  Achieved      PT SHORT TERM GOAL #2    Title  The patient will report a 25% improvement in neck pain with looking at her phone, in the mornings and with usual ADLs    Time  4    Period  Weeks    Status  Achieved   100%       PT Long Term Goals - 07/25/18 1216      PT LONG TERM GOAL #1   Title  The patient will be independent in safe self progression of HEP and self management    Time  8    Period  Weeks    Status  New    Target Date  09/19/18      PT LONG TERM GOAL #2   Title  The patient will report a 50% improvement in neck pain and near baseline level to perform her meal prep,  cleaning with greater ease    Time  8    Period  Weeks    Status  New      PT LONG TERM GOAL #3   Title  Improved cervical ROM with flexion 32 degrees and rotation to 40 degrees needed for driving    Time  8    Period  Weeks    Status  New      PT LONG TERM GOAL #4   Title  Cervical, scapular and thoracic strength grossly 4-/5 needed for lifting/carrying groceries    Time  8    Period  Weeks    Status  New      PT LONG TERM GOAL #5   Title  FOTO functional outcome score improved from 51% limitation to 43%.    Time  8    Period  Weeks    Status  New            Plan - 08/08/18 0915    Clinical Impression Statement  Pt had very positive experience last session regarding the dry needling. She reported no pain in her neck, improved ROM and a desire to do the DN again. She would like to focus around her shoulder blade area. Pt was educated on  cervical and lumbar supportive body mechanics education after her concern with mopping/sweeping/yard care and how those activities typically cause back pain. Pt met STG #2.    Personal Factors and Comorbidities  Age;Past/Current Experience;Behavior Pattern;Comorbidity 1;Comorbidity 2;Comorbidity 3+;Other    Comorbidities  lumbar fusion 2018;  osteopenia; multi region OA;  anxiety/depression;  fearfulness regarding being in public during the pandemic    Examination-Activity Limitations   Reach Overhead;Sit;Sleep;Lift    Examination-Participation Restrictions  Meal Prep;Cleaning;Driving;Yard Work    [employer]  Evolving/Moderate complexity    Rehab Potential  Good    PT Frequency  2x / week    PT Duration  8 weeks    PT Treatment/Interventions  ADLs/Self Care Home Management;Electrical Stimulation;Cryotherapy;Ultrasound;Traction;Moist Heat;Neuromuscular re-education;Therapeutic exercise;Therapeutic activities;Patient/family education;Manual techniques;Taping;Dry needling    PT Next Visit Plan  DN #2, recheck ROM ( ran out of time today) continue with upper quarter strength and endurance.    PT Home Exercise Plan  Access Code: [email or web address]    Consulted and Agree with Plan of Care  Patient       Patient will benefit from skilled therapeutic intervention in order to improve the following deficits and impairments:  Increased fascial restricitons, Increased muscle spasms, Pain, Decreased balance, Hypomobility, Impaired perceived functional ability, Decreased strength, Postural dysfunction  Visit Diagnosis: 1. Cervicalgia   2. Muscle weakness (generalized)        Problem List Patient Active Problem List   Diagnosis Date Noted  . Myofascial pain 03/27/2018  . Numbness of left foot 11/07/2017  . Right foot pain 09/12/2017  . Deviated septum 01/25/2017  . ETD (Eustachian tube dysfunction), bilateral 01/25/2017  . Seasonal allergic rhinitis 01/25/2017  . Bilateral hearing loss 01/25/2017  . S/P lumbar fusion 06/20/2016  . 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 Fatty liver 12/22/2015  . Onychodystrophy 11/24/2015  . Prepatellar bursitis of right knee 08/18/2014  . Hyperlipidemia 12/17/2013  . Osteoarthritis of left knee 07/05/2013  . Obesity 03/10/2011  . Dyspepsia 02/10/2011  . Pulmonary nodule 12/10/2010  . PALPITATIONS 02/11/2010  . OSTEOPENIA 11/27/2009  . CONSTIPATION, CHRONIC 08/15/2008  . NECK PAIN 04/11/2008  .  Disorder of kidney and ureter 01/21/2008  . HEMORRHOIDS, INTERNAL 01/16/2008  . DIVERTICULOSIS, COLON 01/16/2008  . POSTMENOPAUSAL STATUS 12/05/2007  . MICROSCOPIC HEMATURIA 06/05/2007  . STENOSIS, LUMBAR SPINE 12/22/2005  . ANXIETY 11/15/2005  . IRRITABLE BOWEL SYNDROME 11/15/2005  . ARTHRITIS 11/15/2005    Cheryl [name], PTA 08/08/2018, 9:54 AM  [facility] [address] [place], [place], [address] Phone: [phone]   Fax:  [phone]  Name: Cheryl [name] MRN: [id number] Date of Birth: 11-19-1940

## 2018-08-13 ENCOUNTER — Ambulatory Visit: Payer: PPO | Admitting: Physical Therapy

## 2018-08-15 ENCOUNTER — Ambulatory Visit: Payer: PPO | Admitting: Physical Therapy

## 2018-08-16 ENCOUNTER — Other Ambulatory Visit: Payer: Self-pay

## 2018-08-16 ENCOUNTER — Encounter: Payer: Self-pay | Admitting: Physical Therapy

## 2018-08-16 ENCOUNTER — Ambulatory Visit: Payer: PPO | Admitting: Physical Therapy

## 2018-08-16 DIAGNOSIS — M542 Cervicalgia: Secondary | ICD-10-CM | POA: Diagnosis not present

## 2018-08-16 DIAGNOSIS — M6281 Muscle weakness (generalized): Secondary | ICD-10-CM

## 2018-08-16 NOTE — Therapy (Signed)
[facility] [address] [place], [place], [address] Phone: [phone]   Fax:  [phone]  Physical Therapy Treatment/Discharge Summary   Patient Details  Name: Cheryl [name] MRN: [id number] Date of Birth: [birth year] Referring Provider (PT): Dr. [name]    Encounter Date: 08/16/2018  PT End of Session - 08/16/18 1311    Visit Number  6    Date for PT Re-Evaluation  09/19/18    Authorization Type  [employer]    PT Start Time  1228    PT Stop Time  1315    PT Time Calculation (min)  47 min    Activity Tolerance  Patient tolerated treatment well       Past Medical History:  Diagnosis Date  . Anemia   . Anxiety    d/t recent family issues  . Arthritis   . Bone spur   . Chronic back pain    stenosis and OA  . Depression    takes Paxil daily  . Diverticulosis   . Dizzy    occasionally  . Fatty liver    has had 2 Hep shots and the final one is in June 18  . GERD (gastroesophageal reflux disease)    takes Omeprazole daily  . Hepatitis 2012  . History of bronchitis    20-25 yrs ago  . History of colon polyps    precancerous  . History of shingles   . Hyperlipidemia    takes Simvastatin daily  . IBS (irritable bowel syndrome)    more on side of constipation-takes Miralax daily  . Insomnia    takes Melatonin and Ambien nightly  . Internal hemorrhoids   . Joint pain   . Joint swelling   . Lung nodules    calcified   . Migraine headache    migraines-thinks coming from neck.Last one1/28/18  . Pneumonia    hx of-20 yrs ago-=-walking  . Renal cyst    left  . Uterine cancer ([place]) 1973    Past Surgical History:  Procedure Laterality Date  . APPENDECTOMY  1966  . cataracts     bilateral  . COLONOSCOPY    . EUS  02/10/2011   Procedure: UPPER ENDOSCOPIC ULTRASOUND (EUS) RADIAL;  Surgeon: Owens Loffler, MD;  Location: [facility];  Service: Endoscopy;  Laterality: N/A;  radial linear   .  RECTOCELE REPAIR  2011  . ROTATOR CUFF REPAIR  2009   left  . TONSILLECTOMY    . UPPER GASTROINTESTINAL ENDOSCOPY    . VAGINAL HYSTERECTOMY  1973    There were no vitals filed for this visit.  Subjective Assessment - 08/16/18 1227    Subjective  I 'm thinking today might be my last day.  I have more ROM than I've had in years!  The dry needling has really helped.  The headaches are still there in the right frontal region.    Pertinent History  Lumbar fusion 2018 Dr. Ellene Route;  depression;  going to the doctor soon to check to see if iron is low and what is causing dizziness    How long can you sit comfortably?  puts pillow behind head in recliner and could sit as long as I want; but in regular chair 5-10 minutes    Diagnostic tests  arthritis in neck;  Dr. Ellene Route "said too much arthritis, can't do surgery."    Patient Stated Goals  make my neck and shoulders feel better  which contributes to migraines;  Patient does not want to focus on balance at this time    Currently in Pain?  No/denies    Pain Score  0-No pain    Pain Location  Neck         [facility] PT Assessment - 08/16/18 0001      Observation/Other Assessments   Focus on Therapeutic Outcomes (FOTO)   12%      AROM   Cervical Flexion  70    Cervical Extension  36    Cervical - Right Side Bend  38    Cervical - Left Side Bend  40    Cervical - Right Rotation  45    Cervical - Left Rotation  40      Strength   Overall Strength Comments  UE grossly WFLs    Cervical Flexion  4-/5    Cervical Extension  4-/5                   [facility] Adult PT Treatment/Exercise - 08/16/18 0001      Shoulder Exercises: Seated   Horizontal ABduction  Strengthening;Both;10 reps;Theraband    Horizontal ABduction Limitations  palms up     Other Seated Exercises  3 way  shoulder raise 1# 10x each BIl      Shoulder Exercises: ROM/Strengthening   UBE (Upper Arm Bike)  L1 3x3 fwd/back   while discussing progress     Moist Heat  Therapy   Number Minutes Moist Heat  5 Minutes    Moist Heat Location  Cervical      Manual Therapy   Manual therapy comments  right uppper trap contract relax 3x 5 sec holds    Soft tissue mobilization  bil cervical paraspinals, upper traps    Myofascial Release  suboccipital release 3x 30 sec    Manual Traction  5x 15 sec holds       Trigger Point Dry Needling - 08/16/18 0001    Consent Given?  Yes    Other Dry Needling  bil suboccipitals    Upper Trapezius Response  Twitch reponse elicited;Palpable increased muscle length    Levator Scapulae Response  Palpable increased muscle length    Cervical multifidi Response  Palpable increased muscle length           PT Education - 08/16/18 1311    Education Details  Access Code: [email or web address]    Person(s) Educated  Patient    Methods  Explanation;Demonstration;Handout    Comprehension  Returned demonstration;Verbalized understanding       PT Short Term Goals - 08/16/18 1421      PT SHORT TERM GOAL #1   Title  The patient will demonstrate improvement in postural correction and awareness and initial HEP    Status  Achieved      PT SHORT TERM GOAL #2   Title  The patient will report a 25% improvement in neck pain with looking at her phone, in the mornings and with usual ADLs    Status  Achieved      PT SHORT TERM GOAL #3   Title  The patient will have improved cervical ROM:  flexion 26, extension 40, sidebending and rotation to 30 degrees needed for driving    Status  Partially Met        PT Long Term Goals - 08/16/18 1237      PT LONG TERM GOAL #1   Title  The patient will be independent in safe self progression of HEP and self  management    Status  Achieved      PT LONG TERM GOAL #2   Title  The patient will report a 50% improvement in neck pain and near baseline level to perform her meal prep,  cleaning with greater ease    Baseline  95%    Status  Achieved      PT LONG TERM GOAL #3   Title  Improved cervical ROM  with flexion 32 degrees and rotation to 40 degrees needed for driving    Status  Achieved      PT LONG TERM GOAL #4   Title  Cervical, scapular and thoracic strength grossly 4-/5 needed for lifting/carrying groceries    Status  Achieved      PT LONG TERM GOAL #5   Title  FOTO functional outcome score improved from 51% limitation to 43%.    Status  Achieved            Plan - 08/16/18 1308    Clinical Impression Statement  Patient reports an overall improvement of 95%.  Her FOTO functional outcome score has improved from 51% limitation to 12%.  Her cervical ROM has improved significantly with flexion but also with bil sidebending and rotation.  The patient reports, "this is the best I've been able to move in years!"  The patient attributes her exceptional improvement to dry needling in conjunction with her regular home compliance with her home ex program.  She has met the majority of her goals at this time and the patient expresses readiness for discharge from PT at this time.    Personal Factors and Comorbidities  Age;Past/Current Experience;Behavior Pattern;Comorbidity 1;Comorbidity 2;Comorbidity 3+;Other    Comorbidities  lumbar fusion 2018;  osteopenia; multi region OA;  anxiety/depression;  fearfulness regarding being in public during the pandemic    PT Home Exercise Plan  Access Code: [email or web address]       Patient will benefit from skilled therapeutic intervention in order to improve the following deficits and impairments:  Increased fascial restricitons, Increased muscle spasms, Pain, Decreased balance, Hypomobility, Impaired perceived functional ability, Decreased strength, Postural dysfunction  Visit Diagnosis: 1. Cervicalgia   2. Muscle weakness (generalized)     PHYSICAL THERAPY DISCHARGE SUMMARY  Visits from Start of Care: 6  Current functional level related to goals / functional outcomes: See clinical impressions above   Remaining deficits: As above   Education /  Equipment: HEP Plan: Patient agrees to discharge.  Patient goals were partially met. Patient is being discharged due to being pleased with the current functional level.  ?????         Problem List Patient Active Problem List   Diagnosis Date Noted  . Myofascial pain 03/27/2018  . Numbness of left foot 11/07/2017  . Right foot pain 09/12/2017  . Deviated septum 01/25/2017  . ETD (Eustachian tube dysfunction), bilateral 01/25/2017  . Seasonal allergic rhinitis 01/25/2017  . Bilateral hearing loss 01/25/2017  . S/P lumbar fusion 06/20/2016  . 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 Fatty liver 12/22/2015  . Onychodystrophy 11/24/2015  . Prepatellar bursitis of right knee 08/18/2014  . Hyperlipidemia 12/17/2013  . Osteoarthritis of left knee 07/05/2013  . Obesity 03/10/2011  . Dyspepsia 02/10/2011  . Pulmonary nodule 12/10/2010  . PALPITATIONS 02/11/2010  . OSTEOPENIA 11/27/2009  . CONSTIPATION, CHRONIC 08/15/2008  . NECK PAIN 04/11/2008  . Disorder of kidney and ureter 01/21/2008  . HEMORRHOIDS, INTERNAL 01/16/2008  . DIVERTICULOSIS, COLON 01/16/2008  . POSTMENOPAUSAL STATUS 12/05/2007  .  MICROSCOPIC HEMATURIA 06/05/2007  . STENOSIS, LUMBAR SPINE 12/22/2005  . ANXIETY 11/15/2005  . IRRITABLE BOWEL SYNDROME 11/15/2005  . ARTHRITIS 11/15/2005   Ruben Im, PT 08/16/18 2:23 PM Phone: [phone] Fax: [phone] Alvera Singh 08/16/2018, Bonna Gains PM  [facility] [address] [place], [place], [address] Phone: [phone]   Fax:  [phone]  Name: Sadee Osland MRN: [id number] Date of Birth: 15-May-1940

## 2018-08-16 NOTE — Patient Instructions (Signed)
Access Code: [email or web address]  URL: [email or web address]  Date: 08/16/2018  Prepared by: Ruben Im   Exercises  Seated Cervical Retraction - 10 reps - 1 sets - 3x daily - 7x weekly  Supine Chin Tuck - 10 reps - 1 sets - 3x daily - 7x weekly  Seated Scapular Retraction - 10 reps - 1 sets - 1x daily - 7x weekly  Standing Shoulder Extension with Resistance - 10 reps - 2 sets - 2x daily - 7x weekly  Standing Bilateral Low Shoulder Row with Anchored Resistance - 10 reps - 2 sets - 2x daily - 7x weekly  Seated Upper Trap Stretch - 10 reps - 1 sets - 10 seconds hold - 1x daily - 7x weekly  Gentle Levator Scapulae Stretch - 3 reps - 1 sets - 30 hold - 1x daily - 7x weekly  Beginner Head Nod - 10 reps - 1 sets - 1x daily - 7x weekly  Seated Deep Neck Flexor Nods - 10 reps - 1 sets - 1x daily - 7x weekly  Standing Shoulder Flexion with Dumbbells - 10 reps - 1 sets - 1x daily - 7x weekly  Scaption with Dumbbells - 10 reps - 1 sets - 1x daily - 7x weekly  Seated Shoulder Horizontal Abduction with Resistance - 10 reps - 1 sets - 1x daily - 7x weekly

## 2018-08-20 ENCOUNTER — Encounter: Payer: PPO | Admitting: Physical Therapy

## 2018-08-21 ENCOUNTER — Encounter: Payer: PPO | Admitting: Physical Therapy

## 2018-08-22 ENCOUNTER — Encounter: Payer: PPO | Admitting: Physical Therapy

## 2018-08-24 ENCOUNTER — Ambulatory Visit: Payer: PPO

## 2018-08-24 ENCOUNTER — Other Ambulatory Visit: Payer: Self-pay

## 2018-08-24 DIAGNOSIS — M542 Cervicalgia: Secondary | ICD-10-CM | POA: Diagnosis not present

## 2018-08-24 DIAGNOSIS — R2681 Unsteadiness on feet: Secondary | ICD-10-CM

## 2018-08-24 DIAGNOSIS — M6281 Muscle weakness (generalized): Secondary | ICD-10-CM

## 2018-08-24 DIAGNOSIS — R2689 Other abnormalities of gait and mobility: Secondary | ICD-10-CM

## 2018-08-24 DIAGNOSIS — R42 Dizziness and giddiness: Secondary | ICD-10-CM

## 2018-08-24 NOTE — Therapy (Signed)
[facility] [address] [place], [place], [address] Phone: [phone]   Fax:  [phone]  Physical Therapy Evaluation  Patient Details  Name: Cheryl [name] MRN: [id number] Date of Birth: Jan 20, 1941 Referring Provider (PT): Dr. Madilyn Fireman   Encounter Date: 08/24/2018  PT End of Session - 08/24/18 1208    Visit Number  1    Number of Visits  17    Date for PT Re-Evaluation  11/22/18    Authorization Type  [employer]: Progress note every 10th visit.    PT Start Time  1000    PT Stop Time  1050    PT Time Calculation (min)  50 min    Equipment Utilized During Treatment  Other (comment)   min A -S prn   Activity Tolerance  Patient tolerated treatment well    Behavior During Therapy  WFL for tasks assessed/performed       Past Medical History:  Diagnosis Date  . Anemia   . Anxiety    d/t recent family issues  . Arthritis   . Bone spur   . Chronic back pain    stenosis and OA  . Depression    takes Paxil daily  . Diverticulosis   . Dizzy    occasionally  . Fatty liver    has had 2 Hep shots and the final one is in June 18  . GERD (gastroesophageal reflux disease)    takes Omeprazole daily  . Hepatitis 2012  . History of bronchitis    20-25 yrs ago  . History of colon polyps    precancerous  . History of shingles   . Hyperlipidemia    takes Simvastatin daily  . IBS (irritable bowel syndrome)    more on side of constipation-takes Miralax daily  . Insomnia    takes Melatonin and Ambien nightly  . Internal hemorrhoids   . Joint pain   . Joint swelling   . Lung nodules    calcified   . Migraine headache    migraines-thinks coming from neck.Last one1/28/18  . Pneumonia    hx of-20 yrs ago-=-walking  . Renal cyst    left  . Uterine cancer ([place]) 1973    Past Surgical History:  Procedure Laterality Date  . APPENDECTOMY  1966  . cataracts     bilateral  . COLONOSCOPY    . EUS   02/10/2011   Procedure: UPPER ENDOSCOPIC ULTRASOUND (EUS) RADIAL;  Surgeon: Owens Loffler, MD;  Location: [facility];  Service: Endoscopy;  Laterality: N/A;  radial linear   . RECTOCELE REPAIR  2011  . ROTATOR CUFF REPAIR  2009   left  . TONSILLECTOMY    . UPPER GASTROINTESTINAL ENDOSCOPY    . VAGINAL HYSTERECTOMY  1973    There were no vitals filed for this visit.   Subjective Assessment - 08/24/18 1008    Subjective  Pt reported history of vertigo, which began approx. twenty five years ago with hx of migraines. She went to a chiropractor for inital bout of dizziness and it improved until a few years ago. Pt reports it is impacting her ability to amb. in her yard, she has fallen three times. Pt feels intermittent dizziness when rolling to the L side and getting up from chair-pt reported spinning (< 1 minute). Pt does not climb ladder due to impaired balance. Pt does not walk straight anymore, especially when walking dog (Yorkie). Pt no longer has tinnitus. Pt is HOH and reports  episodes of "it sounds like everything is turned down for a few seconds".    Pertinent History  Lumbar fusion (Lx stenosis) 2018 Dr. Ellene Route;  depression, anxiety, deviated septum, allergies, arthritis, osteopenia, hx of migraines    Patient Stated Goals  To not be falling anymore    Currently in Pain?  No/denies         [facility] PT Assessment - 08/24/18 1016      Assessment   Medical Diagnosis  BPPV    Referring Provider (PT)  Dr. Madilyn Fireman    Onset Date/Surgical Date  08/06/18   with pt reporting balance/dizziness getting worse over time   Hand Dominance  Right    Next MD Visit  as needed       Precautions   Precautions  Fall      Restrictions   Weight Bearing Restrictions  No      Balance Screen   Has the patient fallen in the past 6 months  Yes    How many times?  3    Has the patient had a decrease in activity level because of a fear of falling?   Yes    Is the patient reluctant to leave their home  because of a fear of falling?   No      Home Environment   Living Environment  Private residence    Living Arrangements  Alone    Available Help at Discharge  Family   dtr lives next door   Type of [place] Access  Level entry    [place]  None      Prior Function   Level of [place]  Retired    Leisure  hug people; go out to eat and play cards with friends      Cognition   Overall Cognitive Status  Within Functional Limits for tasks assessed      Sensation   Additional Comments  Pt reported intermittent N/T in hands-she was seeing PT for neck/shoulder issues and was told N/T is likley from neck issues      Ambulation/Gait   Ambulation/Gait  Yes    Ambulation/Gait Assistance  5: Supervision    Ambulation/Gait Assistance Details  S for safety.    Ambulation Distance (Feet)  300 Feet    Assistive device  None    Gait Pattern  Step-through pattern;Decreased stride length;Decreased trunk rotation   guarded   Ambulation Surface  Level;Indoor    Gait velocity  3.42ft/sec.       Functional Gait  Assessment   Gait assessed   Yes    Gait Level Surface  Walks 20 ft, slow speed, abnormal gait pattern, evidence for imbalance or deviates 10-15 in outside of the 12 in walkway width. Requires more than 7 sec to ambulate 20 ft.   7.4sec.    Change in Gait Speed  Makes only minor adjustments to walking speed, or accomplishes a change in speed with significant gait deviations, deviates 10-15 in outside the 12 in walkway width, or changes speed but loses balance but is able to recover and continue walking.    Gait with Horizontal Head Turns  Performs head turns with moderate changes in gait velocity, slows down, deviates 10-15 in outside 12 in walkway width but recovers, can continue to walk.    Gait with Vertical Head Turns  Performs task with slight change in  gait velocity (eg, minor disruption to smooth gait path),  deviates 6 - 10 in outside 12 in walkway width or uses assistive device    Gait and Pivot Turn  Turns slowly, requires verbal cueing, or requires several small steps to catch balance following turn and stop    Step Over Obstacle  Is able to step over one shoe box (4.5 in total height) but must slow down and adjust steps to clear box safely. May require verbal cueing.    Gait with Narrow Base of Support  Ambulates less than 4 steps heel to toe or cannot perform without assistance.    Gait with Eyes Closed  Walks 20 ft, slow speed, abnormal gait pattern, evidence for imbalance, deviates 10-15 in outside 12 in walkway width. Requires more than 9 sec to ambulate 20 ft.   11.25sec. and deviated   Ambulating Backwards  Walks 20 ft, slow speed, abnormal gait pattern, evidence for imbalance, deviates 10-15 in outside 12 in walkway width.    Steps  Alternating feet, must use rail.    Total Score  10    FGA comment:  10/30: high risk for falls           Vestibular Assessment - 08/24/18 1034      Symptom Behavior   Subjective history of current problem  See subjective assessment for details.     Type of Dizziness   Spinning    Frequency of Dizziness  Intermittent (sometimes daily and sometimes no dizziness for weeks)    Duration of Dizziness  Less than 1 minute    Symptom Nature  Motion provoked;Positional    Aggravating Factors  Rolling to left;Sit to stand    Relieving Factors  Rest;Closing eyes    Progression of Symptoms  Worse    History of similar episodes  Hx of dizziness on and off for 25 years      Oculomotor Exam   Oculomotor Alignment  Normal    Spontaneous  Absent    Gaze-induced   Absent    Smooth Pursuits  Intact    Saccades  Intact    Comment  Pt denied dizziness during exam.      Oculomotor Exam-Fixation Suppressed    Left Head Impulse  Negative    Right Head Impulse  Negative      Vestibulo-Ocular Reflex   VOR 1 Head Only (x 1 viewing)  Decr. speed, no saccades noted  but pt had to stop 2/2 mild dizziness (1/10).    VOR to Slow Head Movement  Normal      Positional Testing   Dix-Hallpike  Dix-Hallpike Right;Dix-Hallpike Left    Horizontal Canal Testing  Horizontal Canal Right;Horizontal Canal Left      Dix-Hallpike Right   Dix-Hallpike Right Duration  0    Dix-Hallpike Right Symptoms  No nystagmus      Dix-Hallpike Left   Dix-Hallpike Left Duration  0    Dix-Hallpike Left Symptoms  No nystagmus      Horizontal Canal Right   Horizontal Canal Right Duration  0    Horizontal Canal Right Symptoms  Normal      Horizontal Canal Left   Horizontal Canal Left Duration  0    Horizontal Canal Left Symptoms  Normal      Positional Sensitivities   Supine to Sitting  Lightheadedness   but not concordant dizziness         Objective measurements completed on examination: See above findings.  PT Education - 08/24/18 1200    Education Details  PT discussed outcome measures, PT POC, duration, and frequency. PT encouraged pt to discuss referral to neurologist in order to assess pt's migraines.    Person(s) Educated  Patient    Methods  Explanation    Comprehension  Verbalized understanding       PT Short Term Goals - 08/24/18 1217      PT SHORT TERM GOAL #1   Title  Pt will be IND in HEP to improve balance, dizziness and safety. TARGET DATE FOR ALL STGS:09/21/18    Time  4    Period  Weeks    Status  New      PT SHORT TERM GOAL #2   Title  Pt will improve FGA score to >/=14/30 to decr. falls risk.    Time  4    Period  Weeks    Status  New      PT SHORT TERM GOAL #3   Title  Pt will improve gait speed to >/=3.61ft/sec with no AD to improve functional mobility.    Baseline  3.60ft/sec    Time  4    Period  Weeks    Status  New      PT SHORT TERM GOAL #4   Title  Pt will amb. 500' over even terrain no AD, while performing head turns/nods, IND to improve safety during amb.    Time  4    Period  Weeks    Status  New       PT SHORT TERM GOAL #5   Title  Perform SOT and write goals as indicated.    Time  4    Status  New        PT Long Term Goals - 08/24/18 1220      PT LONG TERM GOAL #1   Title  Re-Assess for BPPV as indicated and write goals. TARGET DATE FOR ALL LTGS:10/19/18    Time  8    Period  Weeks    Status  New      PT LONG TERM GOAL #2   Title  Pt will improve FGA score to >/=19/30 to decr. falls risk.    Time  8    Period  Weeks    Status  New      PT LONG TERM GOAL #3   Title  Pt will amb. 1000' over uneven/grassy terrain, IND, to safely garden at home.    Time  8    Period  Weeks    Status  New      PT LONG TERM GOAL #4   Title  Pt will report no falls in the last 4 weeks to improve safety.    Time  8    Period  Weeks    Status  New      PT LONG TERM GOAL #5   Title  Pt will report no dizziness during sit to stand transfers and rolling to L side in bed to improve QOL, safety, and functional mobility    Time  8    Period  Weeks    Status  New      Additional Long Term Goals   Additional Long Term Goals  Yes      PT LONG TERM GOAL #6   Title  Pt will amb. >/=4.60ft/sec. no AD to improve functional mobility.    Time  8    Period  Weeks  Status  New             Plan - 08/24/18 1209    Clinical Impression Statement  Pt is a pleasant 78y/o female presenting to [facility]PP[facility] neuro for dizziness. Pt's PMH is significant for the following: Lumbar fusion (Lx stenosis) 2018 Dr. Ellene Route;  depression, anxiety, deviated septum, allergies, arthritis, osteopenia, hx of migraines. All positional testing was negative for BPPV, but pt's hx of s/s are consistent with BPPV so PT will continue to monitor, assess and treat as indicated. Pt's gait speed indicated pt is able to amb. in the community. However, pt's gait speed 3.37ft/sec (0.53m/s) is below normal for her gender and age group (1.64m/s). Pt's FGA score indicated pt is at high risk for falls. Pt noted to experience incr. postural  sway and LOB during activities which require incr. vestibular input and pt experience mild concordant dizziness during VOR testing. PT will have pt perform SOT next session. Please see below for deficits noted upon exam. Pt would benefit from skilled PT to improve safety during functional mobilty.    Personal Factors and Comorbidities  Age;Past/Current Experience;Behavior Pattern;Comorbidity 1;Comorbidity 2;Comorbidity 3+;Other    Comorbidities  Lumbar fusion (Lx stenosis) 2018 Dr. Ellene Route;  depression, anxiety, deviated septum, allergies, arthritis, osteopenia, hx of migraines    Examination-Activity Limitations  Stand;Sleep;Locomotion Level    Examination-Participation Restrictions  Cleaning;Yard Work;Driving    Stability/Clinical Decision Making  Evolving/Moderate complexity    Clinical Decision Making  Moderate    Rehab Potential  Good    PT Frequency  2x / week    PT Duration  8 weeks    PT Treatment/Interventions  ADLs/Self Care Home Management;Aquatic Therapy;Biofeedback;Canalith Repostioning;DME Instruction;Balance training;Therapeutic exercise;Therapeutic activities;Functional mobility training;Stair training;Gait training;Neuromuscular re-education;Patient/family education;Vestibular    PT Next Visit Plan  Perform SOT and write goals as indicated. Provide pt with balance (corner) and x1 viewing HEP.    Consulted and Agree with Plan of Care  Patient       Patient will benefit from skilled therapeutic intervention in order to improve the following deficits and impairments:  Decreased balance, Decreased strength, Postural dysfunction, Abnormal gait, Decreased knowledge of use of DME, Decreased mobility, Dizziness(strength not formally assessed but weakness suspected based on gait deviations)  Visit Diagnosis: 1. Dizziness and giddiness   2. Other abnormalities of gait and mobility   3. Muscle weakness (generalized)   4. Unsteadiness on feet        Problem List Patient Active  Problem List   Diagnosis Date Noted  . Myofascial pain 03/27/2018  . Numbness of left foot 11/07/2017  . Right foot pain 09/12/2017  . Deviated septum 01/25/2017  . ETD (Eustachian tube dysfunction), bilateral 01/25/2017  . Seasonal allergic rhinitis 01/25/2017  . Bilateral hearing loss 01/25/2017  . S/P lumbar fusion 06/20/2016  . 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 Fatty liver 12/22/2015  . Onychodystrophy 11/24/2015  . Prepatellar bursitis of right knee 08/18/2014  . Hyperlipidemia 12/17/2013  . Osteoarthritis of left knee 07/05/2013  . Obesity 03/10/2011  . Dyspepsia 02/10/2011  . Pulmonary nodule 12/10/2010  . PALPITATIONS 02/11/2010  . OSTEOPENIA 11/27/2009  . CONSTIPATION, CHRONIC 08/15/2008  . NECK PAIN 04/11/2008  . Disorder of kidney and ureter 01/21/2008  . HEMORRHOIDS, INTERNAL 01/16/2008  . DIVERTICULOSIS, COLON 01/16/2008  . POSTMENOPAUSAL STATUS 12/05/2007  . MICROSCOPIC HEMATURIA 06/05/2007  . STENOSIS, LUMBAR SPINE 12/22/2005  . ANXIETY 11/15/2005  . IRRITABLE BOWEL SYNDROME 11/15/2005  . ARTHRITIS 11/15/2005    Gerod Caligiuri  L 08/24/2018, 12:25 PM  [facility] [address] [place], [place], [address] Phone: [phone]   Fax:  [phone]  Name: Arielys Wandersee MRN: [id number] Date of Birth: 01-Apr-1940  Geoffry Paradise, PT,DPT 08/24/18 12:26 PM Phone: [phone] Fax: [phone]

## 2018-08-27 ENCOUNTER — Encounter: Payer: PPO | Admitting: Physical Therapy

## 2018-08-29 ENCOUNTER — Encounter: Payer: PPO | Admitting: Physical Therapy

## 2018-08-31 ENCOUNTER — Encounter: Payer: Self-pay | Admitting: Family Medicine

## 2018-08-31 ENCOUNTER — Encounter

## 2018-08-31 DIAGNOSIS — G43809 Other migraine, not intractable, without status migrainosus: Secondary | ICD-10-CM

## 2018-09-06 ENCOUNTER — Ambulatory Visit: Payer: PPO | Admitting: Rehabilitative and Restorative Service Providers"

## 2018-09-10 ENCOUNTER — Other Ambulatory Visit: Payer: Self-pay | Admitting: Family Medicine

## 2018-09-10 DIAGNOSIS — Z1231 Encounter for screening mammogram for malignant neoplasm of breast: Secondary | ICD-10-CM

## 2018-09-14 ENCOUNTER — Ambulatory Visit: Payer: PPO | Admitting: Physical Therapy

## 2018-09-19 ENCOUNTER — Encounter: Payer: PPO | Admitting: Physical Therapy

## 2018-09-20 ENCOUNTER — Other Ambulatory Visit: Payer: Self-pay | Admitting: Family Medicine

## 2018-09-20 DIAGNOSIS — F419 Anxiety disorder, unspecified: Secondary | ICD-10-CM

## 2018-09-21 ENCOUNTER — Encounter: Payer: Self-pay | Admitting: Family Medicine

## 2018-09-21 DIAGNOSIS — E785 Hyperlipidemia, unspecified: Secondary | ICD-10-CM

## 2018-09-21 DIAGNOSIS — E611 Iron deficiency: Secondary | ICD-10-CM

## 2018-09-24 ENCOUNTER — Encounter: Payer: PPO | Admitting: Physical Therapy

## 2018-09-26 NOTE — Addendum Note (Signed)
Addended by: [name] D on: 09/26/2018 08:31 PM   Modules accepted: Orders

## 2018-09-26 NOTE — Telephone Encounter (Signed)
Hi Sheria, sorry for the delay in getting back to you.  We do have the high-dose flu vaccines and now.  You are welcome to come here or go to [facility].  I know it is a bit of a tract to come this far out so you are definitely welcome to get it at your pharmacy.  Just asked them to send [name] notification when you have it done and will get your chart updated.  Medicare should pay for it at either location.  I am glad you decided to go ahead and give the statin another try.  Some people just cannot take it every day so I am glad you are doing well every other day so that just sounds perfect.  Just continue with that regimen.  We can definitely get your lab work updated.  We can go ahead and send over an order.  Just remind me of the [facility] location that you are using.  I would go ahead and keep the appointment with [name] just to make sure there is not any additional work-up etc. needed at this point.  Dr. Jerilynn Mages

## 2018-09-27 ENCOUNTER — Encounter: Payer: PPO | Admitting: Physical Therapy

## 2018-09-29 ENCOUNTER — Encounter: Payer: Self-pay | Admitting: Family Medicine

## 2018-10-02 ENCOUNTER — Encounter: Payer: PPO | Admitting: Physical Therapy

## 2018-10-04 ENCOUNTER — Other Ambulatory Visit: Payer: Self-pay

## 2018-10-04 ENCOUNTER — Ambulatory Visit (INDEPENDENT_AMBULATORY_CARE_PROVIDER_SITE_OTHER): Payer: PPO | Admitting: Family Medicine

## 2018-10-04 ENCOUNTER — Encounter: Payer: PPO | Admitting: Physical Therapy

## 2018-10-04 DIAGNOSIS — E785 Hyperlipidemia, unspecified: Secondary | ICD-10-CM | POA: Diagnosis not present

## 2018-10-04 DIAGNOSIS — Z23 Encounter for immunization: Secondary | ICD-10-CM

## 2018-10-04 DIAGNOSIS — E611 Iron deficiency: Secondary | ICD-10-CM | POA: Diagnosis not present

## 2018-10-05 LAB — LIPID PANEL
Cholesterol: 201 mg/dL — ABNORMAL HIGH (ref <200)
HDL: 41 mg/dL — ABNORMAL LOW (ref >=50)
LDL Cholesterol (Calc): 121 mg/dL (calc) — ABNORMAL HIGH
Non-HDL Cholesterol (Calc): 160 mg/dL (calc) — ABNORMAL HIGH (ref <130)
Total CHOL/HDL Ratio: 4.9 (calc) (ref <5.0)
Triglycerides: 253 mg/dL — ABNORMAL HIGH (ref <150)

## 2018-10-05 LAB — IRON,TIBC AND FERRITIN PANEL
%SAT: 56 % (calc) — ABNORMAL HIGH (ref 16–45)
Ferritin: 51 ng/mL (ref 16–288)
Iron: 164 ug/dL — ABNORMAL HIGH (ref 45–160)
TIBC: 295 mcg/dL (calc) (ref 250–450)

## 2018-10-08 ENCOUNTER — Encounter: Payer: Self-pay | Admitting: Family Medicine

## 2018-10-11 ENCOUNTER — Other Ambulatory Visit: Payer: Self-pay

## 2018-10-11 ENCOUNTER — Encounter: Payer: Self-pay | Admitting: Cardiovascular Disease

## 2018-10-11 ENCOUNTER — Ambulatory Visit: Payer: PPO | Admitting: Cardiovascular Disease

## 2018-10-11 VITALS — BP 115/67 | HR 76 | Ht 64.0 in | Wt 188.0 lb

## 2018-10-11 DIAGNOSIS — I10 Essential (primary) hypertension: Secondary | ICD-10-CM

## 2018-10-11 DIAGNOSIS — E7800 Pure hypercholesterolemia, unspecified: Secondary | ICD-10-CM

## 2018-10-11 NOTE — Progress Notes (Signed)
Cardiology Office Note   Date:  10/11/2018   ID:  Hendricks Limes Renad, [place] 10-04-1940, MRN [id number]  PCP:  Hali Marry, MD  Cardiologist:   [name], MD   No chief complaint on file.  Patient ID: Cheryl [name] is a 78 y.o. female with hyperlipidemia and mild carotid stenosis who presents for follow up.  She was first seen 04/2015 with a report of shortness of breath.  She had an echo 05/28/15 that revealed LVEF 55-60% with grade 1 diastolic dysfunction. She also had a Lexiscan Myoview 05/07/15 that revealed LVEF 60% with a small, fixed septal/anteroseptal defect that was felt to be artifact. There is no evidence of ischemia.  She has struggled with orthostatic hypotension and was instructed to wear compression stockings increase her salt intake. She underwent L4/L5 decompression surgery on 03/2016.  She then struggled to breathe due to her back brace.    Since her last appointment Cheryl [name] has been well physically.  She has been struggling for the last several months emotionally.  Her daughter Cheryl [name] moved from complications of lupus and her best friend died from the coronavirus.  She has had x-rays any chest pain or shortness of breath.  She does have some mild swelling in her legs that is worse in the summertime.  Gets better with elevation.  She denies orthopnea or PND.  She has not been getting much exercise but does like to walk her dog.  Lately she has not been walking as much due to the heat.  She hopes to start back again soon  Past Medical History:  Diagnosis Date  . Anemia   . Anxiety    d/t recent family issues  . Arthritis   . Bone spur   . Chronic back pain    stenosis and OA  . Depression    takes Paxil daily  . Diverticulosis   . Dizzy    occasionally  . Fatty liver    has had 2 Hep shots and the final one is in June 18  . GERD (gastroesophageal reflux disease)    takes Omeprazole daily  . Hepatitis 2012  . History of bronchitis    20-25 yrs ago   . History of colon polyps    precancerous  . History of shingles   . Hyperlipidemia    takes Simvastatin daily  . IBS (irritable bowel syndrome)    more on side of constipation-takes Miralax daily  . Insomnia    takes Melatonin and Ambien nightly  . Internal hemorrhoids   . Joint pain   . Joint swelling   . Lung nodules    calcified   . Migraine headache    migraines-thinks coming from neck.Last one1/28/18  . Pneumonia    hx of-20 yrs ago-=-walking  . Renal cyst    left  . Uterine cancer ([place]) 1973    Past Surgical History:  Procedure Laterality Date  . APPENDECTOMY  1966  . cataracts     bilateral  . COLONOSCOPY    . EUS  02/10/2011   Procedure: UPPER ENDOSCOPIC ULTRASOUND (EUS) RADIAL;  Surgeon: Owens Loffler, MD;  Location: [facility];  Service: Endoscopy;  Laterality: N/A;  radial linear   . RECTOCELE REPAIR  2011  . ROTATOR CUFF REPAIR  2009   left  . TONSILLECTOMY    . UPPER GASTROINTESTINAL ENDOSCOPY    . VAGINAL HYSTERECTOMY  1973    Current Outpatient Medications  Medication Sig Dispense Refill  .  ALPRAZolam (XANAX) 0.5 MG tablet TAKE 1 TABLET BY MOUTH AT BEDTIME AS NEEDED FOR ANXIETY 15 tablet 1  . aspirin 81 MG tablet Take 81 mg by mouth daily.      . Cholecalciferol (VITAMIN D) 1000 UNITS capsule Take 1,000 Units by mouth daily.      . fluticasone (FLONASE) 50 MCG/ACT nasal spray SPRAY 2 SPRAYS INTO EACH NOSTRIL EVERY DAY 1 g 3  . hydrOXYzine (VISTARIL) 25 MG capsule TAKE 1 CAPSULE (25 MG TOTAL) BY MOUTH 3 (THREE) TIMES DAILY AS NEEDED FOR ITCHING. 270 capsule 1  . MAGNESIUM PO Take 1 tablet by mouth daily.     Cheryl Kitchen omeprazole (PRILOSEC) 20 MG capsule TAKE 1 CAPSULE TWICE DAILY BEFORE A MEAL 180 capsule 3  . PARoxetine (PAXIL) 20 MG tablet TAKE 1 TABLET BY MOUTH EVERY DAY IN THE MORNING 90 tablet 1  . Pitavastatin Calcium (LIVALO) 2 MG TABS Take 2 mg by mouth every other day.    . polyethylene glycol (MIRALAX / GLYCOLAX) packet Take 17 g by mouth daily.     . RESTASIS 0.05 % ophthalmic emulsion Place 1 drop into both eyes 2 (two) times daily.     . traMADol (ULTRAM) 50 MG tablet Take 1 tablet (50 mg total) by mouth 2 (two) times daily as needed. 180 tablet 0  . zolpidem (AMBIEN) 5 MG tablet Take 1 tablet (5 mg total) by mouth at bedtime as needed. for sleep 30 tablet 4   No current facility-administered medications for this visit.     Allergies:   Fluoxetine hcl, Contrast media [iodinated diagnostic agents], Dexamethasone, Livalo [pitavastatin], Metrizamide, Nitroglycerin, Rosuvastatin, Simvastatin, Codeine sulfate, Sulfasalazine, and Sulfonamide derivatives    Social History:  The patient  reports that she quit smoking about 25 years ago. She has never used smokeless tobacco. She reports that she does not drink alcohol or use drugs.   Family History:  The patient's family history includes Alcohol abuse in her brother and father; Crohn's disease in her brother; Depression in her brother; HIV in her son; Heart disease in an other family member; Heart failure in her daughter; Kidney failure in her mother; Lung cancer in her father and son; Rheum arthritis in her mother.    ROS:  Please see the history of present illness.   Otherwise, review of systems are positive for none.   All other systems are reviewed and negative.    PHYSICAL EXAM: VS:  BP 115/67   [name] 78   Ht 5\' 4"  (1.626 m)   Wt 188 lb (85.3 kg)   SpO2 96%   BMI 32.27 kg/m  , BMI Body mass index is 32.27 kg/m. GENERAL:  Well appearing HEENT: Pupils equal round and reactive, fundi not visualized, oral mucosa unremarkable NECK:  No jugular venous distention, waveform within normal limits, carotid upstroke brisk and symmetric, no bruits LUNGS:  Clear to auscultation bilaterally HEART:  RRR.  PMI not displaced or sustained,S1 and S2 within normal limits, no S3, no S4, no clicks, no rubs, no murmurs ABD:  Flat, positive bowel sounds normal in frequency in pitch, no bruits, no  rebound, no guarding, no midline pulsatile mass, no hepatomegaly, no splenomegaly EXT:  2 plus pulses throughout, no edema, no cyanosis no clubbing SKIN:  No rashes no nodules NEURO:  Cranial nerves II through XII grossly intact, motor grossly intact throughout PSYCH:  Cognitively intact, oriented to person place and time   EKG:  EKG is ordered today. 04/25/16: Sinus rhythm.  Rate 97 bpm.  Low voltage precordial leads 04/24/17: Sinus rhythm.  Rate 73 bpm.   10/11/18: Sinus rhythm.  Rate 76 bpm.  Low voltage precordial leads.    Echo 05/28/15: Study Conclusions  - Left ventricle: The cavity size was normal. Wall thickness was  normal. Systolic function was normal. The estimated ejection  fraction was in the range of 55% to 60%. Wall motion was normal;  there were no regional wall motion abnormalities. Doppler  parameters are consistent with abnormal left ventricular  relaxation (grade 1 diastolic dysfunction).  Impressions:  - Normal LV systolic function; grade 1 diastolic dysfunction; trace  MR and TR.  Lexiscan Cardiolite 05/07/15:  Nuclear stress EF: 60%.  The left ventricular ejection fraction is normal (55-65%).  There was no ST segment deviation noted during stress.  No T wave inversion was noted during stress.  Defect 1: There is a small defect of moderate severity.  This is a low risk study.  Small size, moderate intensity fixed septal/inferoseptal perfusion defect which is likely artifact. No reversible ischemia. LVEF 60% with normal wall motion. This is a low risk study.   Recent Labs: 07/31/2018: ALT 18; BUN 12; Creat 0.82; Potassium 4.3; Sodium 139    Lipid Panel    Component Value Date/Time   CHOL 201 (H) 10/04/2018 1037   CHOL 174 08/14/2017 0824   CHOL 244 (H) 03/02/2011 0045   TRIG 253 (H) 10/04/2018 1037   TRIG 303 (H) 03/02/2011 0045   HDL 41 (L) 10/04/2018 1037   HDL 44 08/14/2017 0824   HDL 45 03/02/2011 0045   CHOLHDL 4.9 10/04/2018  1037   VLDL 38 (H) 08/05/2016 0906   LDLCALC 121 (H) 10/04/2018 1037   LDLCALC 138 (H) 03/02/2011 0045      Wt Readings from Last 3 Encounters:  10/11/18 188 lb (85.3 kg)  06/12/18 174 lb (78.9 kg)  04/03/18 181 lb (82.1 kg)      ASSESSMENT AND PLAN:  # Shortness of breath:  Resolved.  Negative stress test and Echo is unremarkable. Recommended that she continue to increase her exercise and make sure she is walking at a brisk pace.  # Hyperlipidemia: She has struggled with several statins.  She is currently on pitavastatin and tolerating it.  She will return for lipids and a CMP in 1 month.  LDL was 121 on 09/2018.  This is an improvement from prior.   # Carotid stenosis:  Mild disease in 2015.  That was stable on 05/2017.  We will repeat carotid Dopplers at her next department.     Current medicines are reviewed at length with the patient today.  The patient does not have concerns regarding medicines.  The following changes have been made:  no change  Labs/ tests ordered today include:   No orders of the defined types were placed in this encounter.    Disposition:   FU with Marilena Trevathan C. Oval Linsey, MD, [facility] in 1 year.    Signed, Laiden Milles C. Oval Linsey, MD, [facility]  10/11/2018 4:53 PM    [place]

## 2018-10-11 NOTE — Patient Instructions (Signed)
Medication Instructions:  Your physician recommends that you continue on your current medications as directed. Please refer to the Current Medication list given to you today.  If you need a refill on your cardiac medications before your next appointment, please call your pharmacy.   Lab work: NONE  Testing/Procedures: NONE   Follow-Up: At [facility], you and your health needs are our priority.  As part of our continuing mission to provide you with exceptional heart care, we have created designated Provider Care Teams.  These Care Teams include your primary Cardiologist (physician) and Advanced Practice Providers (APPs -  Physician Assistants and Nurse Practitioners) who all work together to provide you with the care you need, when you need it. You will need a follow up appointment in 12 months.  Please call our office 2 months in advance to schedule this appointment.  You may see DR [place]  or one of the following Advanced Practice Providers on your designated Care Team:   Luke Kilroy, PA-C Krista Kroeger, PA-C . Callie Goodrich, PA-C    

## 2018-10-18 DIAGNOSIS — Z20828 Contact with and (suspected) exposure to other viral communicable diseases: Secondary | ICD-10-CM | POA: Diagnosis not present

## 2018-11-27 ENCOUNTER — Encounter: Payer: Self-pay | Admitting: Family Medicine

## 2018-11-28 ENCOUNTER — Encounter: Payer: Self-pay | Admitting: Family Medicine

## 2018-11-28 ENCOUNTER — Other Ambulatory Visit: Payer: Self-pay

## 2018-11-28 ENCOUNTER — Ambulatory Visit (INDEPENDENT_AMBULATORY_CARE_PROVIDER_SITE_OTHER): Payer: PPO | Admitting: Family Medicine

## 2018-11-28 VITALS — BP 113/70 | HR 79 | Temp 98.1°F | Wt 190.0 lb

## 2018-11-28 DIAGNOSIS — N3001 Acute cystitis with hematuria: Secondary | ICD-10-CM

## 2018-11-28 DIAGNOSIS — Z789 Other specified health status: Secondary | ICD-10-CM | POA: Diagnosis not present

## 2018-11-28 DIAGNOSIS — R300 Dysuria: Secondary | ICD-10-CM

## 2018-11-28 LAB — POCT URINALYSIS DIPSTICK
Glucose, UA: NEGATIVE
Ketones, UA: NEGATIVE
Leukocytes, UA: NEGATIVE
Nitrite, UA: NEGATIVE
Protein, UA: NEGATIVE
Spec Grav, UA: 1.01 (ref 1.010–1.025)
Urobilinogen, UA: 0.2 E.U./dL
pH, UA: 6.5 (ref 5.0–8.0)

## 2018-11-28 MED ORDER — CEFDINIR 300 MG PO CAPS: 300.0000 mg | ORAL_CAPSULE | Freq: Two times a day (BID) | ORAL | 0 refills | Status: DC

## 2018-11-28 NOTE — Patient Instructions (Signed)
Thank you for coming in today. Take omnicef twice daily for 1 week.  I will let you know when the culture is back.  I will also let Dr Madilyn Fireman know that you cannot tolerate Livalo.  Next step is Repatha or Praluent.   Keep me updated about UTI.    Urinary Tract Infection, Adult A urinary tract infection (UTI) is an infection of any part of the urinary tract. The urinary tract includes:  The kidneys.  The ureters.  The bladder.  The urethra. These organs make, store, and get rid of pee (urine) in the body. What are the causes? This is caused by germs (bacteria) in your genital area. These germs grow and cause swelling (inflammation) of your urinary tract. What increases the risk? You are more likely to develop this condition if:  You have a small, thin tube (catheter) to drain pee.  You cannot control when you pee or poop (incontinence).  You are female, and: ? You use these methods to prevent pregnancy: ? A medicine that kills sperm (spermicide). ? A device that blocks sperm (diaphragm). ? You have low levels of a female hormone (estrogen). ? You are pregnant.  You have genes that add to your risk.  You are sexually active.  You take antibiotic medicines.  You have trouble peeing because of: ? A prostate that is bigger than normal, if you are female. ? A blockage in the part of your body that drains pee from the bladder (urethra). ? A kidney stone. ? A nerve condition that affects your bladder (neurogenic bladder). ? Not getting enough to drink. ? Not peeing often enough.  You have other conditions, such as: ? Diabetes. ? A weak disease-fighting system (immune system). ? Sickle cell disease. ? Gout. ? Injury of the spine. What are the signs or symptoms? Symptoms of this condition include:  Needing to pee right away (urgently).  Peeing often.  Peeing small amounts often.  Pain or burning when peeing.  Blood in the pee.  Pee that smells bad or not  like normal.  Trouble peeing.  Pee that is cloudy.  Fluid coming from the vagina, if you are female.  Pain in the belly or lower back. Other symptoms include:  Throwing up (vomiting).  No urge to eat.  Feeling mixed up (confused).  Being tired and grouchy (irritable).  A fever.  Watery poop (diarrhea). How is this treated? This condition may be treated with:  Antibiotic medicine.  Other medicines.  Drinking enough water. Follow these instructions at home:  Medicines  Take over-the-counter and prescription medicines only as told by your doctor.  If you were prescribed an antibiotic medicine, take it as told by your doctor. Do not stop taking it even if you start to feel better. General instructions  Make sure you: ? Pee until your bladder is empty. ? Do not hold pee for a long time. ? Empty your bladder after sex. ? Wipe from front to back after pooping if you are a female. Use each tissue one time when you wipe.  Drink enough fluid to keep your pee pale yellow.  Keep all follow-up visits as told by your doctor. This is important. Contact a doctor if:  You do not get better after 1-2 days.  Your symptoms go away and then come back. Get help right away if:  You have very bad back pain.  You have very bad pain in your lower belly.  You have a fever.  You  are sick to your stomach (nauseous).  You are throwing up. Summary  A urinary tract infection (UTI) is an infection of any part of the urinary tract.  This condition is caused by germs in your genital area.  There are many risk factors for a UTI. These include having a small, thin tube to drain pee and not being able to control when you pee or poop.  Treatment includes antibiotic medicines for germs.  Drink enough fluid to keep your pee pale yellow. This information is not intended to replace advice given to you by your health care provider. Make sure you discuss any questions you have with your  health care provider. Document Released: 07/13/2007 Document Revised: 01/11/2018 Document Reviewed: 08/03/2017 Elsevier Patient Education  2020 [employer].

## 2018-11-28 NOTE — Telephone Encounter (Signed)
Patient placed follow up call about this today.   In office appt today?

## 2018-11-28 NOTE — Telephone Encounter (Signed)
Went ahead and scheduled patient with Dr Georgina Snell to be seen today

## 2018-11-28 NOTE — Progress Notes (Signed)
Cheryl [name] is a 78 y.o. female who presents to [facility] [place] today for possible UTI.  Starting yesterday Cheryl [name] developed urinary frequency urgency and pain with urination.  She denies any vaginal discharge fever chills nausea vomiting or diarrhea.  She tried AZO which helps some.  Her symptoms are consistent with prior UTI.  Additionally she notes that she has a history of hyperlipidemia with difficulty tolerating statins.  She was prescribed Livalo but had to discontinue it because of myalgias and paresthesia    ROS as above:  Exam:  BP 113/70   Pulse 79   Temp 98.1 F (36.7 C) (Oral)   Wt 190 lb (86.2 kg)   BMI 32.61 kg/m  Wt Readings from Last 5 Encounters:  11/28/18 190 lb (86.2 kg)  10/11/18 188 lb (85.3 kg)  06/12/18 174 lb (78.9 kg)  04/03/18 181 lb (82.1 kg)  03/27/18 177 lb (80.3 kg)    Gen: Well NAD HEENT: EOMI,  MMM Lungs: Normal work of breathing. CTABL Heart: RRR no MRG Abd: NABS, Soft. Nondistended, Nontender no CVA tenderness to percussion Exts: Brisk capillary refill, warm and well perfused.   Lab and Radiology Results Results for orders placed or performed in visit on 11/28/18 (from the past 72 hour(s))  POCT Urinalysis Dipstick     Status: Abnormal   Collection Time: 11/28/18  1:41 PM  Result Value Ref Range   Color, UA yellow    Clarity, UA clear    Glucose, UA Negative Negative   Bilirubin, UA eg    Ketones, UA neg    Spec Grav, UA 1.010 1.010 - 1.025   Blood, UA small    pH, UA 6.5 5.0 - 8.0   Protein, UA Negative Negative   Urobilinogen, UA 0.2 0.2 or 1.0 E.U./dL   Nitrite, UA neg    Leukocytes, UA Negative Negative   Appearance     Odor     No results found.    Assessment and Plan: 78 y.o. female with UTI.  Empiric treatment with Omnicef.  Urine culture pending.  Statin intolerance.  Will discuss with PCP and  cardiology.  Next step is probably Repatha or Praluent.  Patient mentions that she is very needle phobic but is willing to consider an injectable medicine potentially.  PDMP not reviewed this encounter. Orders Placed This Encounter  Procedures  . Urine Culture  . POCT Urinalysis Dipstick   Meds ordered this encounter  Medications  . cefdinir (OMNICEF) 300 MG capsule    Sig: Take 1 capsule (300 mg total) by mouth 2 (two) times daily.    Dispense:  14 capsule    Refill:  0     Historical information moved to improve visibility of documentation.  Past Medical History:  Diagnosis Date  . Anemia   . Anxiety    d/t recent family issues  . Arthritis   . Bone spur   . Chronic back pain    stenosis and OA  . Depression    takes Paxil daily  . Diverticulosis   . Dizzy    occasionally  . Fatty liver    has had 2 Hep shots and the final one is in June 18  . GERD (gastroesophageal reflux disease)    takes Omeprazole daily  . Hepatitis 2012  . History of bronchitis    20-25 yrs ago  . History of colon polyps    precancerous  .  History of shingles   . Hyperlipidemia    takes Simvastatin daily  . IBS (irritable bowel syndrome)    more on side of constipation-takes Miralax daily  . Insomnia    takes Melatonin and Ambien nightly  . Internal hemorrhoids   . Joint pain   . Joint swelling   . Lung nodules    calcified   . Migraine headache    migraines-thinks coming from neck.Last one1/28/18  . Pneumonia    hx of-20 yrs ago-=-walking  . Renal cyst    left  . Uterine cancer ([place]) 1973   Past Surgical History:  Procedure Laterality Date  . APPENDECTOMY  1966  . cataracts     bilateral  . COLONOSCOPY    . EUS  02/10/2011   Procedure: UPPER ENDOSCOPIC ULTRASOUND (EUS) RADIAL;  Surgeon: Owens Loffler, MD;  Location: [facility];  Service: Endoscopy;  Laterality: N/A;  radial linear   . RECTOCELE REPAIR  2011  . ROTATOR CUFF REPAIR  2009   left  . TONSILLECTOMY    . UPPER  GASTROINTESTINAL ENDOSCOPY    . VAGINAL HYSTERECTOMY  1973   Social History   Tobacco Use  . Smoking status: Former Smoker    Quit date: 02/07/1993    Years since quitting: 25.8  . Smokeless tobacco: Never Used  Substance Use Topics  . Alcohol use: No   family history includes Alcohol abuse in her brother and father; Crohn's disease in her brother; Depression in her brother; HIV in her son; Heart disease in an other family member; Heart failure in her daughter; Kidney failure in her mother; Lung cancer in her father and son; Rheum arthritis in her mother.  Medications: Current Outpatient Medications  Medication Sig Dispense Refill  . ALPRAZolam (XANAX) 0.5 MG tablet TAKE 1 TABLET BY MOUTH AT BEDTIME AS NEEDED FOR ANXIETY 15 tablet 1  . aspirin 81 MG tablet Take 81 mg by mouth daily.      . cefdinir (OMNICEF) 300 MG capsule Take 1 capsule (300 mg total) by mouth 2 (two) times daily. 14 capsule 0  . Cholecalciferol (VITAMIN D) 1000 UNITS capsule Take 1,000 Units by mouth daily.      . fluticasone (FLONASE) 50 MCG/ACT nasal spray SPRAY 2 SPRAYS INTO EACH NOSTRIL EVERY DAY 1 g 3  . hydrOXYzine (VISTARIL) 25 MG capsule TAKE 1 CAPSULE (25 MG TOTAL) BY MOUTH 3 (THREE) TIMES DAILY AS NEEDED FOR ITCHING. 270 capsule 1  . MAGNESIUM PO Take 1 tablet by mouth daily.     Marland Kitchen omeprazole (PRILOSEC) 20 MG capsule TAKE 1 CAPSULE TWICE DAILY BEFORE A MEAL 180 capsule 3  . PARoxetine (PAXIL) 20 MG tablet TAKE 1 TABLET BY MOUTH EVERY DAY IN THE MORNING 90 tablet 1  . Pitavastatin Calcium (LIVALO) 2 MG TABS Take 2 mg by mouth every other day.    . polyethylene glycol (MIRALAX / GLYCOLAX) packet Take 17 g by mouth daily.    . RESTASIS 0.05 % ophthalmic emulsion Place 1 drop into both eyes 2 (two) times daily.     . traMADol (ULTRAM) 50 MG tablet Take 1 tablet (50 mg total) by mouth 2 (two) times daily as needed. 180 tablet 0  . zolpidem (AMBIEN) 5 MG tablet Take 1 tablet (5 mg total) by mouth at bedtime as  needed. for sleep 30 tablet 4   No current facility-administered medications for this visit.    Allergies  Allergen Reactions  . Fluoxetine Hcl Shortness Of Breath and  Itching    REACTION: itching,SOB  . Contrast Media [Iodinated Diagnostic Agents] Itching    Multilance.   . Dexamethasone Diarrhea and Nausea And Vomiting  . Livalo [Pitavastatin] Other (See Comments)    Myalgia   . Metrizamide Itching    Multilance.   . Nitroglycerin     Nausea,dizziness,sweats  . Rosuvastatin Other (See Comments)    bodyaches and cramping  . Simvastatin Other (See Comments)    Muscle aches and HA  . Codeine Sulfate Itching    REACTION: itching  . Sulfasalazine Nausea Only    REACTION: Nausea  . Sulfonamide Derivatives Nausea Only    REACTION: Nausea     Discussed warning signs or symptoms. Please see discharge instructions. Patient expresses understanding.

## 2018-11-30 ENCOUNTER — Encounter: Payer: Self-pay | Admitting: Family Medicine

## 2018-11-30 DIAGNOSIS — M542 Cervicalgia: Secondary | ICD-10-CM

## 2018-11-30 LAB — URINE CULTURE
MICRO NUMBER:: 1014869
SPECIMEN QUALITY:: ADEQUATE

## 2018-12-19 ENCOUNTER — Encounter: Payer: Self-pay | Admitting: Family Medicine

## 2018-12-20 ENCOUNTER — Ambulatory Visit (INDEPENDENT_AMBULATORY_CARE_PROVIDER_SITE_OTHER): Payer: PPO

## 2018-12-20 ENCOUNTER — Encounter: Payer: Self-pay | Admitting: Family Medicine

## 2018-12-20 ENCOUNTER — Other Ambulatory Visit: Payer: Self-pay

## 2018-12-20 ENCOUNTER — Ambulatory Visit (INDEPENDENT_AMBULATORY_CARE_PROVIDER_SITE_OTHER): Payer: PPO | Admitting: Family Medicine

## 2018-12-20 VITALS — BP 139/69 | HR 87 | Ht 64.5 in | Wt 188.0 lb

## 2018-12-20 DIAGNOSIS — R202 Paresthesia of skin: Secondary | ICD-10-CM | POA: Diagnosis not present

## 2018-12-20 DIAGNOSIS — M545 Low back pain, unspecified: Secondary | ICD-10-CM

## 2018-12-20 DIAGNOSIS — M7918 Myalgia, other site: Secondary | ICD-10-CM | POA: Diagnosis not present

## 2018-12-20 NOTE — Progress Notes (Signed)
Acute Office Visit  Subjective:    Patient ID: Cheryl [name], female    DOB: [birth year], 77 y.o.   MRN: [id number]  Chief Complaint  Patient presents with  . Peripheral Neuropathy    HPI Patient is in today for low back pain that has been more persistent over the last several months..  She is having a lot of pain in that area and then going into her right buttock area.  She feels like she is getting neuropathy in both of her legs up to her knees.  And is beginning to wonder if the 2 things are connected.  She says when she is lying in bed and stretched out she is comfortable but sitting is more uncomfortable.  She has been using 100 mg of tramadol once a day and that does seem to help.  The pain in her legs feels more like an electrical sensation.  She actually had an L4-5 posterior lumbar fusion in February 2018 with Dr. Ola Spurr.     Past Medical History:  Diagnosis Date  . Anemia   . Anxiety    d/t recent family issues  . Arthritis   . Bone spur   . Chronic back pain    stenosis and OA  . Depression    takes Paxil daily  . Diverticulosis   . Dizzy    occasionally  . Fatty liver    has had 2 Hep shots and the final one is in June 18  . GERD (gastroesophageal reflux disease)    takes Omeprazole daily  . Hepatitis 2012  . History of bronchitis    20-25 yrs ago  . History of colon polyps    precancerous  . History of shingles   . Hyperlipidemia    takes Simvastatin daily  . IBS (irritable bowel syndrome)    more on side of constipation-takes Miralax daily  . Insomnia    takes Melatonin and Ambien nightly  . Internal hemorrhoids   . Joint pain   . Joint swelling   . Lung nodules    calcified   . Migraine headache    migraines-thinks coming from neck.Last one1/28/18  . Pneumonia    hx of-20 yrs ago-=-walking  . Renal cyst    left  . Uterine cancer ([place]) 1973    Past Surgical History:  Procedure Laterality Date  . APPENDECTOMY  1966  . cataracts      bilateral  . COLONOSCOPY    . EUS  02/10/2011   Procedure: UPPER ENDOSCOPIC ULTRASOUND (EUS) RADIAL;  Surgeon: Owens Loffler, MD;  Location: [facility];  Service: Endoscopy;  Laterality: N/A;  radial linear   . LUMBAR FUSION  03/2016   L4-5, Dr. Ola Spurr.  Marland Kitchen RECTOCELE REPAIR  2011  . ROTATOR CUFF REPAIR  2009   left  . TONSILLECTOMY    . UPPER GASTROINTESTINAL ENDOSCOPY    . VAGINAL HYSTERECTOMY  1973    Family History  Problem Relation Age of Onset  . Alcohol abuse Father   . Lung cancer Father        smoker  . Kidney failure Mother        med induced  . Rheum arthritis Mother   . Depression Brother   . Heart disease Other        Paternal family   . Heart failure Daughter   . Lung cancer Son   . Crohn's disease Brother   . Alcohol abuse Brother   . HIV Son   .  Malignant hyperthermia Neg Hx   . Colon cancer Neg Hx   . Esophageal cancer Neg Hx   . Rectal cancer Neg Hx   . Stomach cancer Neg Hx     Social History   Socioeconomic History  . Marital status: Legally Separated    Spouse name: Not on file  . Number of children: 4  . Years of education: Not on file  . Highest education level: Not on file  Occupational History  . Occupation: Retired      Fish farm manager: RETIRED  Social Needs  . Financial resource strain: Not on file  . Food insecurity    Worry: Not on file    Inability: Not on file  . Transportation needs    Medical: Not on file    Non-medical: Not on file  Tobacco Use  . Smoking status: Former Smoker    Quit date: 02/07/1993    Years since quitting: 25.8  . Smokeless tobacco: Never Used  Substance and Sexual Activity  . Alcohol use: No  . Drug use: No  . Sexual activity: Not on file  Lifestyle  . Physical activity    Days per week: Not on file    Minutes per session: Not on file  . Stress: Not on file  Relationships  . Social Herbalist on phone: Not on file    Gets together: Not on file    Attends religious service: Not on file     Active member of club or organization: Not on file    Attends meetings of clubs or organizations: Not on file    Relationship status: Not on file  . Intimate partner violence    Fear of current or ex partner: Not on file    Emotionally abused: Not on file    Physically abused: Not on file    Forced sexual activity: Not on file  Other Topics Concern  . Not on file  Social History Narrative   Walking daily.              Epworth Sleepiness Scale = 1 (as of 04/10/15)    Outpatient Medications Prior to Visit  Medication Sig Dispense Refill  . ALPRAZolam (XANAX) 0.5 MG tablet TAKE 1 TABLET BY MOUTH AT BEDTIME AS NEEDED FOR ANXIETY 15 tablet 1  . aspirin 81 MG tablet Take 81 mg by mouth daily.      . Cholecalciferol (VITAMIN D) 1000 UNITS capsule Take 1,000 Units by mouth daily.      . fluticasone (FLONASE) 50 MCG/ACT nasal spray SPRAY 2 SPRAYS INTO EACH NOSTRIL EVERY DAY 1 g 3  . hydrOXYzine (VISTARIL) 25 MG capsule TAKE 1 CAPSULE (25 MG TOTAL) BY MOUTH 3 (THREE) TIMES DAILY AS NEEDED FOR ITCHING. 270 capsule 1  . MAGNESIUM PO Take 1 tablet by mouth daily.     Marland Kitchen omeprazole (PRILOSEC) 20 MG capsule TAKE 1 CAPSULE TWICE DAILY BEFORE A MEAL 180 capsule 3  . PARoxetine (PAXIL) 20 MG tablet TAKE 1 TABLET BY MOUTH EVERY DAY IN THE MORNING 90 tablet 1  . polyethylene glycol (MIRALAX / GLYCOLAX) packet Take 17 g by mouth daily.    . RESTASIS 0.05 % ophthalmic emulsion Place 1 drop into both eyes 2 (two) times daily.     . traMADol (ULTRAM) 50 MG tablet Take 1 tablet (50 mg total) by mouth 2 (two) times daily as needed. 180 tablet 0  . zolpidem (AMBIEN) 5 MG tablet Take 1 tablet (5 mg  total) by mouth at bedtime as needed. for sleep 30 tablet 4  . cefdinir (OMNICEF) 300 MG capsule Take 1 capsule (300 mg total) by mouth 2 (two) times daily. 14 capsule 0  . Pitavastatin Calcium (LIVALO) 2 MG TABS Take 2 mg by mouth every other day.     No facility-administered medications prior to visit.      Allergies  Allergen Reactions  . Fluoxetine Hcl Shortness Of Breath and Itching    REACTION: itching,SOB  . Contrast Media [Iodinated Diagnostic Agents] Itching    Multilance.   . Dexamethasone Diarrhea and Nausea And Vomiting  . Livalo [Pitavastatin] Other (See Comments)    Myalgia   . Metrizamide Itching    Multilance.   . Nitroglycerin     Nausea,dizziness,sweats  . Rosuvastatin Other (See Comments)    bodyaches and cramping  . Simvastatin Other (See Comments)    Muscle aches and HA  . Codeine Sulfate Itching    REACTION: itching  . Sulfasalazine Nausea Only    REACTION: Nausea  . Sulfonamide Derivatives Nausea Only    REACTION: Nausea    ROS     Objective:    Physical Exam  Constitutional: She is oriented to person, place, and time. She appears well-developed and well-nourished.  HENT:  Head: Normocephalic and atraumatic.  Eyes: Conjunctivae and EOM are normal.  Cardiovascular: Normal rate, regular rhythm and normal heart sounds.  Pulmonary/Chest: Effort normal and breath sounds normal.  Musculoskeletal:     Comments: Low back with normal flexion and extension.  Nontender over the lumbar spine or SI joint.  Negative straight leg raise bilaterally.  Hip, knee, ankle strength is 5-5.  Patellar reflexes 1+ bilaterally  Neurological: She is alert and oriented to person, place, and time.  Gross sensation intact in both feet.  Able to feel monofilament except in right great toe.  Skin: Skin is warm and dry. No pallor.  Psychiatric: She has a normal mood and affect. Her behavior is normal.  Vitals reviewed.   BP 139/69   Pulse 87   Ht 5' 4.5" (1.638 m)   Wt 188 lb (85.3 kg)   SpO2 96%   BMI 31.77 kg/m  Wt Readings from Last 3 Encounters:  12/20/18 188 lb (85.3 kg)  11/28/18 190 lb (86.2 kg)  10/11/18 188 lb (85.3 kg)    There are no preventive care reminders to display for this patient.  There are no preventive care reminders to display for this  patient.   Lab Results  Component Value Date   TSH 3.06 12/20/2018   Lab Results  Component Value Date   WBC 7.1 09/26/2017   HGB 15.3 09/26/2017   HCT 46.5 (H) 09/26/2017   MCV 84.1 09/26/2017   PLT 279 09/26/2017   Lab Results  Component Value Date   NA 139 07/31/2018   K 4.3 07/31/2018   CO2 26 07/31/2018   GLUCOSE 103 (H) 07/31/2018   BUN 12 07/31/2018   CREATININE 0.82 07/31/2018   BILITOT 0.4 07/31/2018   ALKPHOS 98 08/14/2017   AST 24 07/31/2018   ALT 18 07/31/2018   PROT 6.5 07/31/2018   ALBUMIN 4.1 08/14/2017   CALCIUM 9.6 07/31/2018   ANIONGAP 9 03/07/2016   Lab Results  Component Value Date   CHOL 201 (H) 10/04/2018   Lab Results  Component Value Date   HDL 41 (L) 10/04/2018   Lab Results  Component Value Date   LDLCALC 121 (H) 10/04/2018   Lab Results  Component Value Date   TRIG 253 (H) 10/04/2018   Lab Results  Component Value Date   CHOLHDL 4.9 10/04/2018   Lab Results  Component Value Date   HGBA1C 5.8 12/17/2013       Assessment & Plan:   Problem List Items Addressed This Visit    None    Visit Diagnoses    Acute midline low back pain without sciatica    -  Primary   Relevant Orders   DG Lumbar Spine Complete (Completed)   B12 (Completed)   Vitamin B1   TSH (Completed)   Magnesium (Completed)   Paresthesias       Relevant Orders   B12 (Completed)   Vitamin B1   TSH (Completed)   Magnesium (Completed)   Right buttock pain          Acute midline with low back pain - will get x-rays since it has been going on for at least 6 weeks with conservative measurements not helping.  She does have previous lumbar surgery with hardware in place we just make sure that everything looks okay if normal then recommend formal physical therapy.  Anesthesias-may or may not be related.  We will do evaluation to look for B12 deficiency, thyroid disorder, B1 deficiency and low magnesium.    No orders of the defined types were placed in  this encounter.    [name], MD

## 2018-12-25 ENCOUNTER — Other Ambulatory Visit: Payer: Self-pay

## 2018-12-25 ENCOUNTER — Ambulatory Visit (INDEPENDENT_AMBULATORY_CARE_PROVIDER_SITE_OTHER): Payer: PPO | Admitting: Sports Medicine

## 2018-12-25 ENCOUNTER — Encounter: Payer: Self-pay | Admitting: Sports Medicine

## 2018-12-25 DIAGNOSIS — M48062 Spinal stenosis, lumbar region with neurogenic claudication: Secondary | ICD-10-CM | POA: Diagnosis not present

## 2018-12-25 LAB — MAGNESIUM: Magnesium: 2 mg/dL (ref 1.5–2.5)

## 2018-12-25 LAB — TSH: TSH: 3.06 mIU/L (ref 0.40–4.50)

## 2018-12-25 LAB — VITAMIN B1: Vitamin B1 (Thiamine): 26 nmol/L (ref 8–30)

## 2018-12-25 LAB — VITAMIN B12: Vitamin B-12: 565 pg/mL (ref 200–1100)

## 2018-12-25 MED ORDER — TRAMADOL HCL 50 MG PO TABS: 50.0000 mg | ORAL_TABLET | Freq: Two times a day (BID) | ORAL | 0 refills | Status: DC | PRN

## 2018-12-25 MED ORDER — PREDNISONE 50 MG PO TABS: ORAL_TABLET | ORAL | 0 refills | Status: DC

## 2018-12-25 NOTE — Progress Notes (Signed)
Subjective:    CC: Low back pain  HPI: Cheryl [name] is a pleasant 78 year old female, she has a history of L4-L5 lumbar fusion, she was out digging in the backyard a couple of months ago with her daughter, then developed mid low back pain, worse with standing, better with leaning forward, no bowel or bladder dysfunction, saddle numbness, constitutional symptoms.  I reviewed the past medical history, family history, social history, surgical history, and allergies today and no changes were needed.  Please see the problem list section below in epic for further details.  Past Medical History: Past Medical History:  Diagnosis Date  . Anemia   . Anxiety    d/t recent family issues  . Arthritis   . Bone spur   . Chronic back pain    stenosis and OA  . Depression    takes Paxil daily  . Diverticulosis   . Dizzy    occasionally  . Fatty liver    has had 2 Hep shots and the final one is in June 18  . GERD (gastroesophageal reflux disease)    takes Omeprazole daily  . Hepatitis 2012  . History of bronchitis    20-25 yrs ago  . History of colon polyps    precancerous  . History of shingles   . Hyperlipidemia    takes Simvastatin daily  . IBS (irritable bowel syndrome)    more on side of constipation-takes Miralax daily  . Insomnia    takes Melatonin and Ambien nightly  . Internal hemorrhoids   . Joint pain   . Joint swelling   . Lung nodules    calcified   . Migraine headache    migraines-thinks coming from neck.Last one1/28/18  . Pneumonia    hx of-20 yrs ago-=-walking  . Renal cyst    left  . Uterine cancer ([place]) 1973   Past Surgical History: Past Surgical History:  Procedure Laterality Date  . APPENDECTOMY  1966  . cataracts     bilateral  . COLONOSCOPY    . EUS  02/10/2011   Procedure: UPPER ENDOSCOPIC ULTRASOUND (EUS) RADIAL;  Surgeon: Owens Loffler, MD;  Location: [facility];  Service: Endoscopy;  Laterality: N/A;  radial linear   . LUMBAR FUSION  03/2016   L4-5, Dr.  Ola Spurr.  Marland Kitchen RECTOCELE REPAIR  2011  . ROTATOR CUFF REPAIR  2009   left  . TONSILLECTOMY    . UPPER GASTROINTESTINAL ENDOSCOPY    . VAGINAL HYSTERECTOMY  1973   Social History: Social History   Socioeconomic History  . Marital status: Legally Separated    Spouse name: Not on file  . Number of children: 4  . Years of education: Not on file  . Highest education level: Not on file  Occupational History  . Occupation: Retired      Fish farm manager: RETIRED  Social Needs  . Financial resource strain: Not on file  . Food insecurity    Worry: Not on file    Inability: Not on file  . Transportation needs    Medical: Not on file    Non-medical: Not on file  Tobacco Use  . Smoking status: Former Smoker    Quit date: 02/07/1993    Years since quitting: 25.8  . Smokeless tobacco: Never Used  Substance and Sexual Activity  . Alcohol use: No  . Drug use: No  . Sexual activity: Not on file  Lifestyle  . Physical activity    Days per week: Not on file  Minutes per session: Not on file  . Stress: Not on file  Relationships  . Social Herbalist on phone: Not on file    Gets together: Not on file    Attends religious service: Not on file    Active member of club or organization: Not on file    Attends meetings of clubs or organizations: Not on file    Relationship status: Not on file  Other Topics Concern  . Not on file  Social History Narrative   Walking daily.              Epworth Sleepiness Scale = 1 (as of 04/10/15)   Family History: Family History  Problem Relation Age of Onset  . Alcohol abuse Father   . Lung cancer Father        smoker  . Kidney failure Mother        med induced  . Rheum arthritis Mother   . Depression Brother   . Heart disease Other        Paternal family   . Heart failure Daughter   . Lung cancer Son   . Crohn's disease Brother   . Alcohol abuse Brother   . HIV Son   . Malignant hyperthermia Neg Hx   . Colon cancer Neg Hx   .  Esophageal cancer Neg Hx   . Rectal cancer Neg Hx   . Stomach cancer Neg Hx    Allergies: Allergies  Allergen Reactions  . Fluoxetine Hcl Shortness Of Breath and Itching    REACTION: itching,SOB  . Contrast Media [Iodinated Diagnostic Agents] Itching    Multilance.   . Dexamethasone Diarrhea and Nausea And Vomiting  . Livalo [Pitavastatin] Other (See Comments)    Myalgia   . Metrizamide Itching    Multilance.   . Nitroglycerin     Nausea,dizziness,sweats  . Rosuvastatin Other (See Comments)    bodyaches and cramping  . Simvastatin Other (See Comments)    Muscle aches and HA  . Codeine Sulfate Itching    REACTION: itching  . Sulfasalazine Nausea Only    REACTION: Nausea  . Sulfonamide Derivatives Nausea Only    REACTION: Nausea   Medications: See med rec.  Review of Systems: No fevers, chills, night sweats, weight loss, chest pain, or shortness of breath.   Objective:    General: Well Developed, well nourished, and in no acute distress.  Neuro: Alert and oriented x3, extra-ocular muscles intact, sensation grossly intact.  HEENT: Normocephalic, atraumatic, pupils equal round reactive to light, neck supple, no masses, no lymphadenopathy, thyroid nonpalpable.  Skin: Warm and dry, no rashes. Cardiac: Regular rate and rhythm, no murmurs rubs or gallops, no lower extremity edema.  Respiratory: Clear to auscultation bilaterally. Not using accessory muscles, speaking in full sentences. Back Exam:  Inspection: Unremarkable  Motion: Flexion 45 deg, Extension 45 deg, Side Bending to 45 deg bilaterally,  Rotation to 45 deg bilaterally  SLR laying: Negative  XSLR laying: Negative  Palpable tenderness: None. FABER: negative. Sensory change: Gross sensation intact to all lumbar and sacral dermatomes.  Reflexes: 2+ at both patellar tendons, 2+ at achilles tendons, Babinski's downgoing.  Strength at foot  Plantar-flexion: 5/5 Dorsi-flexion: 5/5 Eversion: 5/5 Inversion: 5/5  Leg  strength  Quad: 5/5 Hamstring: 5/5 Hip flexor: 5/5 Hip abductors: 5/5  Gait unremarkable.  Impression and Recommendations:    Lumbar stenosis with neurogenic claudication History of posterior fusion at L4-L5, DDD at L3-L4, symptoms are consistent with  spinal stenosis, she also has some strain, she was out digging in the yard with her daughter. Adding 5 days of prednisone, refilling tramadol, formal physical therapy. Return to see me in 4 weeks, MRI for interventional planning if no better.   ___________________________________________ Gwen Her. Dianah Field, M.D., ABFM., CAQSM. Primary Care and Sports Medicine [facility]  Adjunct Professor of [employer] [facility] [employer]

## 2018-12-25 NOTE — Assessment & Plan Note (Signed)
History of posterior fusion at L4-L5, DDD at L3-L4, symptoms are consistent with spinal stenosis, she also has some strain, she was out digging in the yard with her daughter. Adding 5 days of prednisone, refilling tramadol, formal physical therapy. Return to see me in 4 weeks, MRI for interventional planning if no better.

## 2018-12-25 NOTE — Addendum Note (Signed)
Addended by: Silverio Decamp on: 12/25/2018 11:50 AM   Modules accepted: Orders

## 2018-12-31 ENCOUNTER — Encounter: Payer: Self-pay | Admitting: Family Medicine

## 2018-12-31 ENCOUNTER — Ambulatory Visit (INDEPENDENT_AMBULATORY_CARE_PROVIDER_SITE_OTHER): Payer: PPO | Admitting: Family Medicine

## 2018-12-31 ENCOUNTER — Other Ambulatory Visit: Payer: Self-pay

## 2018-12-31 VITALS — BP 126/58 | HR 71 | Ht 65.0 in | Wt 185.0 lb

## 2018-12-31 DIAGNOSIS — Z0000 Encounter for general adult medical examination without abnormal findings: Secondary | ICD-10-CM

## 2018-12-31 NOTE — Patient Instructions (Signed)
Health Maintenance After Age 76 After age 76, you are at a higher risk for certain long-term diseases and infections as well as injuries from falls. Falls are a major cause of broken bones and head injuries in people who are older than age 76. Getting regular preventive care can help to keep you healthy and well. Preventive care includes getting regular testing and making lifestyle changes as recommended by your health care provider. Talk with your health care provider about:  Which screenings and tests you should have. A screening is a test that checks for a disease when you have no symptoms.  A diet and exercise plan that is right for you. What should I know about screenings and tests to prevent falls? Screening and testing are the best ways to find a health problem early. Early diagnosis and treatment give you the best chance of managing medical conditions that are common after age 76. Certain conditions and lifestyle choices may make you more likely to have a fall. Your health care provider may recommend:  Regular vision checks. Poor vision and conditions such as cataracts can make you more likely to have a fall. If you wear glasses, make sure to get your prescription updated if your vision changes.  Medicine review. Work with your health care provider to regularly review all of the medicines you are taking, including over-the-counter medicines. Ask your health care provider about any side effects that may make you more likely to have a fall. Tell your health care provider if any medicines that you take make you feel dizzy or sleepy.  Osteoporosis screening. Osteoporosis is a condition that causes the bones to get weaker. This can make the bones weak and cause them to break more easily.  Blood pressure screening. Blood pressure changes and medicines to control blood pressure can make you feel dizzy.  Strength and balance checks. Your health care provider may recommend certain tests to check your  strength and balance while standing, walking, or changing positions.  Foot health exam. Foot pain and numbness, as well as not wearing proper footwear, can make you more likely to have a fall.  Depression screening. You may be more likely to have a fall if you have a fear of falling, feel emotionally low, or feel unable to do activities that you used to do.  Alcohol use screening. Using too much alcohol can affect your balance and may make you more likely to have a fall. What actions can I take to lower my risk of falls? General instructions  Talk with your health care provider about your risks for falling. Tell your health care provider if: ? You fall. Be sure to tell your health care provider about all falls, even ones that seem minor. ? You feel dizzy, sleepy, or off-balance.  Take over-the-counter and prescription medicines only as told by your health care provider. These include any supplements.  Eat a healthy diet and maintain a healthy weight. A healthy diet includes low-fat dairy products, low-fat (lean) meats, and fiber from whole grains, beans, and lots of fruits and vegetables. Home safety  Remove any tripping hazards, such as rugs, cords, and clutter.  Install safety equipment such as grab bars in bathrooms and safety rails on stairs.  Keep rooms and walkways well-lit. Activity   Follow a regular exercise program to stay fit. This will help you maintain your balance. Ask your health care provider what types of exercise are appropriate for you.  If you need a cane or   walker, use it as recommended by your health care provider.  Wear supportive shoes that have nonskid soles. Lifestyle  Do not drink alcohol if your health care provider tells you not to drink.  If you drink alcohol, limit how much you have: ? 0-1 drink a day for women. ? 0-2 drinks a day for men.  Be aware of how much alcohol is in your drink. In the [place] one drink equals one typical bottle of beer (12  oz), one-half glass of wine (5 oz), or one shot of hard liquor (1 oz).  Do not use any products that contain nicotine or tobacco, such as cigarettes and e-cigarettes. If you need help quitting, ask your health care provider. Summary  Having a healthy lifestyle and getting preventive care can help to protect your health and wellness after age 76.  Screening and testing are the best way to find a health problem early and help you avoid having a fall. Early diagnosis and treatment give you the best chance for managing medical conditions that are more common for people who are older than age 76.  Falls are a major cause of broken bones and head injuries in people who are older than age 76. Take precautions to prevent a fall at home.  Work with your health care provider to learn what changes you can make to improve your health and wellness and to prevent falls. This information is not intended to replace advice given to you by your health care provider. Make sure you discuss any questions you have with your health care provider. Document Released: 12/07/2016 Document Revised: 05/17/2018 Document Reviewed: 12/07/2016 Elsevier Patient Education  2020 [employer]  

## 2018-12-31 NOTE — Progress Notes (Signed)
Subjective:     Cheryl [name] is a 78 y.o. female and is here for a comprehensive physical exam. The patient reports no problems. She is doing well physically but struggling emotionally. She has had a hard time being isolated during the pandemic.  Has not been exercising as regularly.  Has been off her cholesterol pill for about 2 months.  She was taking it every other day and doing okay but then started to try to take it daily and started to get severe body aches again  Social History   Socioeconomic History  . Marital status: Legally Separated    Spouse name: Not on file  . Number of children: 4  . Years of education: Not on file  . Highest education level: Not on file  Occupational History  . Occupation: Retired      Fish farm manager: RETIRED  Social Needs  . Financial resource strain: Not on file  . Food insecurity    Worry: Not on file    Inability: Not on file  . Transportation needs    Medical: Not on file    Non-medical: Not on file  Tobacco Use  . Smoking status: Former Smoker    Quit date: 02/07/1993    Years since quitting: 25.9  . Smokeless tobacco: Never Used  Substance and Sexual Activity  . Alcohol use: No  . Drug use: No  . Sexual activity: Not on file  Lifestyle  . Physical activity    Days per week: Not on file    Minutes per session: Not on file  . Stress: Not on file  Relationships  . Social Herbalist on phone: Not on file    Gets together: Not on file    Attends religious service: Not on file    Active member of club or organization: Not on file    Attends meetings of clubs or organizations: Not on file    Relationship status: Not on file  . Intimate partner violence    Fear of current or ex partner: Not on file    Emotionally abused: Not on file    Physically abused: Not on file    Forced sexual activity: Not on file  Other Topics Concern  . Not on file  Social History Narrative   Walking daily.              Epworth Sleepiness Scale  = 1 (as of 04/10/15)   Health Maintenance  Topic Date Due  . COLONOSCOPY  11/03/2020  . TETANUS/TDAP  10/11/2022  . INFLUENZA VACCINE  Completed  . DEXA SCAN  Completed  . PNA vac Low Risk Adult  Completed    The following portions of the patient's history were reviewed and updated as appropriate: allergies, current medications, past family history, past medical history, past social history, past surgical history and problem list.  Review of Systems A comprehensive review of systems was negative.   Objective:    BP (!) 126/58   Pulse 71   Ht 5\' 5"  (1.651 m)   Wt 185 lb (83.9 kg)   SpO2 99%   BMI 30.79 kg/m  General appearance: alert, cooperative and appears stated age Head: Normocephalic, without obvious abnormality, atraumatic Eyes: conj clear, EOMI, PEERLA Ears: normal TM's and external ear canals both ears Nose: Nares normal. Septum midline. Mucosa normal. No drainage or sinus tenderness. Throat: lips, mucosa, and tongue normal; teeth and gums normal Neck: no adenopathy, no carotid bruit, no JVD, supple,  symmetrical, trachea midline and thyroid not enlarged, symmetric, no tenderness/mass/nodules Back: symmetric, no curvature. ROM normal. No CVA tenderness. Lungs: clear to auscultation bilaterally Breasts: normal appearance, no masses or tenderness Heart: regular rate and rhythm, S1, S2 normal, no murmur, click, rub or gallop Abdomen: soft, non-tender; bowel sounds normal; no masses,  no organomegaly Extremities: extremities normal, atraumatic, no cyanosis or edema Pulses: 2+ and symmetric Skin: Skin color, texture, turgor normal. No rashes or lesions Lymph nodes: Cervical adenopathy: nl and Supraclavicular adenopathy: nl Neurologic: Alert and oriented X 3, normal strength and tone. Normal symmetric reflexes. Normal coordination and gait    Assessment:    Healthy female exam.      Plan:     See After Visit Summary for Counseling Recommendations   Keep up a regular  exercise program and make sure you are eating a healthy diet Try to eat 4 servings of dairy a day, or if you are lactose intolerant take a calcium with vitamin D daily.  Your vaccines are up to date.  Mammogram and bone density up-to-date. Partial restart her statin but maybe try taking it twice a week.

## 2019-01-01 ENCOUNTER — Other Ambulatory Visit: Payer: Self-pay

## 2019-01-01 ENCOUNTER — Encounter: Payer: Self-pay | Admitting: Physical Therapy

## 2019-01-01 ENCOUNTER — Other Ambulatory Visit: Payer: Self-pay | Admitting: Family Medicine

## 2019-01-01 ENCOUNTER — Ambulatory Visit: Payer: PPO | Attending: Family Medicine | Admitting: Physical Therapy

## 2019-01-01 DIAGNOSIS — M542 Cervicalgia: Secondary | ICD-10-CM | POA: Diagnosis not present

## 2019-01-01 DIAGNOSIS — M6281 Muscle weakness (generalized): Secondary | ICD-10-CM | POA: Insufficient documentation

## 2019-01-01 DIAGNOSIS — G4489 Other headache syndrome: Secondary | ICD-10-CM

## 2019-01-01 NOTE — Therapy (Signed)
[facility] [address] [place], [place], [address] Phone: [phone]   Fax:  [phone]  Physical Therapy Evaluation  Patient Details  Name: Cheryl [name] MRN: [id number] Date of Birth: [birth year] Referring Provider (PT): Dr. Madilyn Fireman   Encounter Date: 01/01/2019  PT End of Session - 01/01/19 1055    Visit Number  1    Date for PT Re-Evaluation  02/26/19    Authorization Type  [employer]: Progress note every 10th visit.  KX at visit 5 secondary to previous PT this year    PT Start Time  1010    PT Stop Time  1050    PT Time Calculation (min)  40 min    Activity Tolerance  Patient tolerated treatment well       Past Medical History:  Diagnosis Date  . Anemia   . Anxiety    d/t recent family issues  . Arthritis   . Bone spur   . Chronic back pain    stenosis and OA  . Depression    takes Paxil daily  . Diverticulosis   . Dizzy    occasionally  . Fatty liver    has had 2 Hep shots and the final one is in June 18  . GERD (gastroesophageal reflux disease)    takes Omeprazole daily  . Hepatitis 2012  . History of bronchitis    20-25 yrs ago  . History of colon polyps    precancerous  . History of shingles   . Hyperlipidemia    takes Simvastatin daily  . IBS (irritable bowel syndrome)    more on side of constipation-takes Miralax daily  . Insomnia    takes Melatonin and Ambien nightly  . Internal hemorrhoids   . Joint pain   . Joint swelling   . Lung nodules    calcified   . Migraine headache    migraines-thinks coming from neck.Last one1/28/18  . Pneumonia    hx of-20 yrs ago-=-walking  . Renal cyst    left  . Uterine cancer (Cheryl [name]) 1973    Past Surgical History:  Procedure Laterality Date  . APPENDECTOMY  1966  . cataracts     bilateral  . COLONOSCOPY    . EUS  02/10/2011   Procedure: UPPER ENDOSCOPIC ULTRASOUND (EUS) RADIAL;  Surgeon: Owens Loffler, MD;  Location: [facility]  ENDOSCOPY;  Service: Endoscopy;  Laterality: N/A;  radial linear   . LUMBAR FUSION  03/2016   L4-5, Dr. Ola Spurr.  Marland Kitchen RECTOCELE REPAIR  2011  . ROTATOR CUFF REPAIR  2009   left  . TONSILLECTOMY    . UPPER GASTROINTESTINAL ENDOSCOPY    . VAGINAL HYSTERECTOMY  1973    There were no vitals filed for this visit.   Subjective Assessment - 01/01/19 1012    Subjective  Complains of frontal headache, migraines for a long time (years); tightness in neck especially base of head.  Sometimes daily for 1-2 weeks then improve for a few weeks but always comes back.    Pertinent History  Lumbar fusion (Lx stenosis) 2018 Dr. Ellene Route;  depression, anxiety, deviated septum, allergies, arthritis, osteopenia, hx of migraines    Patient Stated Goals  feel better    Currently in Pain?  Yes    Pain Score  3     Pain Location  Neck    Pain Orientation  Right;Left;Upper    Pain Type  Chronic pain    Aggravating Factors   nothing  in particular; sometimes starts in jaw    Pain Relieving Factors  heat and ice sometimes         [facility] PT Assessment - 01/01/19 0001      Assessment   Medical Diagnosis  neck pain     Referring Provider (PT)  Dr. Madilyn Fireman    Onset Date/Surgical Date  --   > 6 months   Hand Dominance  Right    Next MD Visit  as needed    Prior Therapy  at this clinic for DN       Precautions   Precautions  Fall      Restrictions   Weight Bearing Restrictions  No      Balance Screen   Has the patient fallen in the past 6 months  No    How many times?  0    Has the patient had a decrease in activity level because of a fear of falling?   Yes    Is the patient reluctant to leave their home because of a fear of falling?   No      Home Environment   Living Environment  Private residence    Living Arrangements  Alone    Available Help at Discharge  Family   dtr lives next door   Type of [place] Access  Level entry    [place]  None       Observation/Other Assessments   Focus on Therapeutic Outcomes (FOTO)   41% limitation       Posture/Postural Control   Posture/Postural Control  Postural limitations    Postural Limitations  Rounded Shoulders;Forward head;Increased thoracic kyphosis      AROM   Overall AROM Comments  UE ROM WFLS     Cervical Flexion  25    Cervical Extension  30    Cervical - Right Side Bend  34    Cervical - Left Side Bend  32    Cervical - Right Rotation  45    Cervical - Left Rotation  39      Strength   Overall Strength Comments  periscapular and shoulder strength grossly 4/5     Cervical Flexion  4/5    Cervical Extension  4/5      Palpation   Spinal mobility  joint hypomobility throughout cervical spine and upper thoracic spine     Palpation comment  tender points in bil suboccipitals;  decreased upper trap muscle lengths bil       Distraction Test   Findngs  Positive                Objective measurements completed on examination: See above findings.      [facility] Adult PT Treatment/Exercise - 01/01/19 0001      Moist Heat Therapy   Number Minutes Moist Heat  5 Minutes    Moist Heat Location  Cervical      Manual Therapy   Soft tissue mobilization  suboccipitals, cervical paraspinals     Manual Traction  3x 20 sec        Trigger Point Dry Needling - 01/01/19 0001    Consent Given?  Yes    Education Handout Provided  Previously provided    Muscles Treated Head and Neck  Suboccipitals;Cervical multifidi    Other Dry Needling  bil suboccipitals     Cervical multifidi Response  Twitch reponse elicited;Palpable increased muscle  length           PT Education - 01/01/19 1054    Education Details  DN after care    Person(s) Educated  Patient    Methods  Explanation;Handout    Comprehension  Verbalized understanding       PT Short Term Goals - 01/01/19 1132      PT SHORT TERM GOAL #1   Title  The patient will demonstrate compliance with initial cervical and  postural correction home  exercises    Time  4    Period  Weeks    Status  New    Target Date  01/29/19      PT SHORT TERM GOAL #2   Title  The patient will have improved cervical flexion to 30 degrees, extension to 35, bil rotation to 38 degrees needed for driving    Time  4    Period  Weeks    Status  New      PT SHORT TERM GOAL #3   Title  The patient will report decreased headache intensity and frequency by 25%    Time  4    Period  Weeks    Status  New      PT SHORT TERM GOAL #4   Title  ...      PT SHORT TERM GOAL #5   Title  ...        PT Long Term Goals - 01/01/19 1134      PT LONG TERM GOAL #1   Title  The patient will be independent in safe self progression of HEP    Time  8    Period  Weeks    Status  New    Target Date  02/26/19      PT LONG TERM GOAL #2   Title  The patient will have improved cervical flexion and extension to 40 degrees and sidebending to 45 degrees for driving    Time  8    Period  Weeks    Status  New      PT LONG TERM GOAL #3   Title  The patient will report a 50% improvement in neck and headache intensity and frequency    Time  8    Period  Weeks    Status  New      PT LONG TERM GOAL #4   Title  Cervical and periscapular strength improved to grossly 4+/5 needed for lifting and carrying objects at home    Time  8    Period  Weeks    Status  New      PT LONG TERM GOAL #5   Title  FOTO functional outcome score improved from 41% limitation to 35% indicating improved function with less pain.    Time  8    Period  Weeks    Status  New      PT LONG TERM GOAL #6   Title  .Marland KitchenMarland Kitchen             Plan - 01/01/19 1123    Clinical Impression Statement  The patient has a chronic history of headaches and neck pain which has worsened during the pandemic.  She reports she has had daily headaches for a week or 2 then the headaches will return within a few weeks.  The pain sometimes starts in her jaw.  Forward head, rounded shoulders,  increased thoracic kyphosis.  Decreased cervical ROM in all planes:  flexion 25, extension  30, right sidebending 34, left sidebending 32, right rotation 45, left rotation 39 degrees.  UE ROM WFLs.  Cervical and shoulder/scapular strength grossly 4/5.  Positive response/relief with manual techniques to soft tissue of cervical spine and to cervical distraction.   She had success in the past at this clinic with dry needling and reports immediate pain relief with this interventions today.    Personal Factors and Comorbidities  Age;Past/Current Experience;Behavior Pattern;Comorbidity 1;Comorbidity 2;Comorbidity 3+;Other    Comorbidities  Lumbar fusion (Lx stenosis) 2018 Dr. Ellene Route; anxiety, allergies, arthritis, osteopenia, hx of migraines    Examination-Activity Limitations  Lift;Other;Carry;Sleep    Examination-Participation Restrictions  Cleaning;Yard Work;Driving    Stability/Clinical Decision Making  Evolving/Moderate complexity    Clinical Decision Making  Moderate    Rehab Potential  Good    PT Frequency  1x / week    PT Duration  8 weeks    PT Treatment/Interventions  ADLs/Self Care Home Management;Cryotherapy;Electrical Stimulation;Ultrasound;Traction;Moist Heat;Therapeutic activities;Therapeutic exercise;Neuromuscular re-education;Manual techniques;Patient/family education;Dry needling;Taping    PT Next Visit Plan  assess response to DN#1 and continue as needed;  manual soft tissue work and cervical joint mobs;  thoracic extension;  initiate postural strengthening    Consulted and Agree with Plan of Care  Patient       Patient will benefit from skilled therapeutic intervention in order to improve the following deficits and impairments:  Increased fascial restricitons, Increased muscle spasms, Pain, Decreased strength, Postural dysfunction, Decreased range of motion  Visit Diagnosis: Cervicalgia - Plan: PT plan of care cert/re-cert  Muscle weakness (generalized) - Plan: PT plan of care  cert/re-cert  Other headache syndrome - Plan: PT plan of care cert/re-cert     Problem List Patient Active Problem List   Diagnosis Date Noted  . Myofascial pain 03/27/2018  . Numbness of left foot 11/07/2017  . Right foot pain 09/12/2017  . Deviated septum 01/25/2017  . ETD (Eustachian tube dysfunction), bilateral 01/25/2017  . Seasonal allergic rhinitis 01/25/2017  . Bilateral hearing loss 01/25/2017  . S/P lumbar fusion 06/20/2016  . 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 Fatty liver 12/22/2015  . Onychodystrophy 11/24/2015  . Prepatellar bursitis of right knee 08/18/2014  . Hyperlipidemia 12/17/2013  . Osteoarthritis of left knee 07/05/2013  . Obesity 03/10/2011  . Dyspepsia 02/10/2011  . Pulmonary nodule 12/10/2010  . PALPITATIONS 02/11/2010  . OSTEOPENIA 11/27/2009  . CONSTIPATION, CHRONIC 08/15/2008  . NECK PAIN 04/11/2008  . Disorder of kidney and ureter 01/21/2008  . HEMORRHOIDS, INTERNAL 01/16/2008  . DIVERTICULOSIS, COLON 01/16/2008  . POSTMENOPAUSAL STATUS 12/05/2007  . MICROSCOPIC HEMATURIA 06/05/2007  . STENOSIS, LUMBAR SPINE 12/22/2005  . ANXIETY 11/15/2005  . IRRITABLE BOWEL SYNDROME 11/15/2005  . ARTHRITIS 11/15/2005   Ruben Im, PT 01/01/19 11:40 AM Phone: [phone] Fax: [phone] Alvera Singh 01/01/2019, 11:40 AM  [facility] [address], [place], [place], [address] Phone: [phone]   Fax:  [phone]  Name: Cheryl [name] MRN: [id number] Date of Birth: 1940-12-21

## 2019-01-01 NOTE — Patient Instructions (Signed)
     Trigger Point Dry Needling  . What is Trigger Point Dry Needling (DN)? o DN is a physical therapy technique used to treat muscle pain and dysfunction. Specifically, DN helps deactivate muscle trigger points (muscle knots).  o A thin filiform needle is used to penetrate the skin and stimulate the underlying trigger point. The goal is for a local twitch response (LTR) to occur and for the trigger point to relax. No medication of any kind is injected during the procedure.   . What Does Trigger Point Dry Needling Feel Like?  o The procedure feels different for each individual patient. Some patients report that they do not actually feel the needle enter the skin and overall the process is not painful. Very mild bleeding may occur. However, many patients feel a deep cramping in the muscle in which the needle was inserted. This is the local twitch response.   . How Will I feel after the treatment? o Soreness is normal, and the onset of soreness may not occur for a few hours. Typically this soreness does not last longer than two days.  o Bruising is uncommon, however; ice can be used to decrease any possible bruising.  o In rare cases feeling tired or nauseous after the treatment is normal. In addition, your symptoms may get worse before they get better, this period will typically not last longer than 24 hours.   . What Can I do After My Treatment? o Increase your hydration by drinking more water for the next 24 hours. o You may place ice or heat on the areas treated that have become sore, however, do not use heat on inflamed or bruised areas. Heat often brings more relief post needling. o You can continue your regular activities, but vigorous activity is not recommended initially after the treatment for 24 hours. o DN is best combined with other physical therapy such as strengthening, stretching, and other therapies.    Vitor Overbaugh PT [facility] [address], [address] [place],  [address] Phone # [phone] Fax [phone] 

## 2019-01-11 ENCOUNTER — Encounter: Payer: Self-pay | Admitting: Physical Therapy

## 2019-01-11 ENCOUNTER — Ambulatory Visit: Payer: PPO | Attending: Family Medicine | Admitting: Physical Therapy

## 2019-01-11 ENCOUNTER — Other Ambulatory Visit: Payer: Self-pay

## 2019-01-11 DIAGNOSIS — M542 Cervicalgia: Secondary | ICD-10-CM | POA: Diagnosis not present

## 2019-01-11 DIAGNOSIS — M6281 Muscle weakness (generalized): Secondary | ICD-10-CM | POA: Diagnosis not present

## 2019-01-11 DIAGNOSIS — G4489 Other headache syndrome: Secondary | ICD-10-CM | POA: Diagnosis not present

## 2019-01-11 NOTE — Patient Instructions (Signed)
Access Code: [email or web address]  URL: [email or web address]  Date: 01/11/2019  Prepared by: Ruben Im   Exercises  Seated Assisted Cervical Rotation with Towel - 10 reps - 1 sets - 1x daily - 7x weekly  Seated Cervical Flexion Stretch with Finger Support Behind Neck - 3 reps - 1 sets - 15 hold - 1x daily - 7x weekly  Supine Suboccipital Release with Tennis Balls - 3 reps - 1 sets - 30 hold - 1x daily - 7x weekly

## 2019-01-11 NOTE — Therapy (Addendum)
[facility] [address] [place], [place], [address] Phone: [phone]   Fax:  [phone]  Physical Therapy Treatment/Discharge Summary   Patient Details  Name: Cheryl [name] MRN: [id number] Date of Birth: [birth year] Referring Provider (PT): Dr. Madilyn Fireman   Encounter Date: 01/11/2019  PT End of Session - 01/11/19 1122    Visit Number  2    Date for PT Re-Evaluation  02/26/19    Authorization Type  [employer]: Progress note every 10th visit.  KX at visit 5 secondary to previous PT this year    PT Start Time  0930    PT Stop Time  1015    PT Time Calculation (min)  45 min    Activity Tolerance  Patient tolerated treatment well       Past Medical History:  Diagnosis Date  . Anemia   . Anxiety    d/t recent family issues  . Arthritis   . Bone spur   . Chronic back pain    stenosis and OA  . Depression    takes Paxil daily  . Diverticulosis   . Dizzy    occasionally  . Fatty liver    has had 2 Hep shots and the final one is in June 18  . GERD (gastroesophageal reflux disease)    takes Omeprazole daily  . Hepatitis 2012  . History of bronchitis    20-25 yrs ago  . History of colon polyps    precancerous  . History of shingles   . Hyperlipidemia    takes Simvastatin daily  . IBS (irritable bowel syndrome)    more on side of constipation-takes Miralax daily  . Insomnia    takes Melatonin and Ambien nightly  . Internal hemorrhoids   . Joint pain   . Joint swelling   . Lung nodules    calcified   . Migraine headache    migraines-thinks coming from neck.Last one1/28/18  . Pneumonia    hx of-20 yrs ago-=-walking  . Renal cyst    left  . Uterine cancer ([place]) 1973    Past Surgical History:  Procedure Laterality Date  . APPENDECTOMY  1966  . cataracts     bilateral  . COLONOSCOPY    . EUS  02/10/2011   Procedure: UPPER ENDOSCOPIC ULTRASOUND (EUS) RADIAL;  Surgeon: Owens Loffler, MD;   Location: [facility];  Service: Endoscopy;  Laterality: N/A;  radial linear   . LUMBAR FUSION  03/2016   L4-5, Dr. Ola Spurr.  Marland Kitchen RECTOCELE REPAIR  2011  . ROTATOR CUFF REPAIR  2009   left  . TONSILLECTOMY    . UPPER GASTROINTESTINAL ENDOSCOPY    . VAGINAL HYSTERECTOMY  1973    There were no vitals filed for this visit.  Subjective Assessment - 01/11/19 0932    Subjective  I went 2 1/2 days without a headache but then I had a humdinger.    A little sore after DN but not bad.  I might be getting a headache. I'm sure some of it is stress.    Currently in Pain?  Yes    Pain Score  2     Pain Location  Neck    Pain Orientation  Right;Left;Upper    Pain Type  Chronic pain                       [facility] Adult PT Treatment/Exercise - 01/11/19 0001  Neck Exercises: Seated   Cervical Rotation  Right;Left;5 reps    Cervical Rotation Limitations  SNAG with towel assist     Other Seated Exercise  upper cspine and lower cspine flexion stretch 5x with 15 sec holds    Other Seated Exercise  review of cervical retractions seated and supine       Neck Exercises: Supine   Other Supine Exercise  suboccipital release with 2 balls 3 min       Moist Heat Therapy   Number Minutes Moist Heat  5 Minutes    Moist Heat Location  Cervical      Manual Therapy   Joint Mobilization  C2-3 rotation mobs grade 2 5x each way     Soft tissue mobilization  suboccipitals, cervical paraspinals; contract/relax upper traps bil    Manual Traction  3x 20 sec        Trigger Point Dry Needling - 01/11/19 0001    Consent Given?  Yes    Muscles Treated Head and Neck  Upper trapezius    Other Dry Needling  bil suboccipitals     Upper Trapezius Response  Twitch reponse elicited;Palpable increased muscle length    Cervical multifidi Response  Palpable increased muscle length           PT Education - 01/11/19 1009    Education Details  Access Code: [email or web address]  headache ex    Person(s)  Educated  Patient    Methods  Explanation;Demonstration;Handout    Comprehension  Returned demonstration;Verbalized understanding       PT Short Term Goals - 01/01/19 1132      PT SHORT TERM GOAL #1   Title  The patient will demonstrate compliance with initial cervical and postural correction home  exercises    Time  4    Period  Weeks    Status  New    Target Date  01/29/19      PT SHORT TERM GOAL #2   Title  The patient will have improved cervical flexion to 30 degrees, extension to 35, bil rotation to 38 degrees needed for driving    Time  4    Period  Weeks    Status  New      PT SHORT TERM GOAL #3   Title  The patient will report decreased headache intensity and frequency by 25%    Time  4    Period  Weeks    Status  New      PT SHORT TERM GOAL #4   Title  ...      PT SHORT TERM GOAL #5   Title  ...        PT Long Term Goals - 01/01/19 1134      PT LONG TERM GOAL #1   Title  The patient will be independent in safe self progression of HEP    Time  8    Period  Weeks    Status  New    Target Date  02/26/19      PT LONG TERM GOAL #2   Title  The patient will have improved cervical flexion and extension to 40 degrees and sidebending to 45 degrees for driving    Time  8    Period  Weeks    Status  New      PT LONG TERM GOAL #3   Title  The patient will report a 50% improvement in neck and headache intensity and frequency  Time  8    Period  Weeks    Status  New      PT LONG TERM GOAL #4   Title  Cervical and periscapular strength improved to grossly 4+/5 needed for lifting and carrying objects at home    Time  8    Period  Weeks    Status  New      PT LONG TERM GOAL #5   Title  FOTO functional outcome score improved from 41% limitation to 35% indicating improved function with less pain.    Time  8    Period  Weeks    Status  New      PT LONG TERM GOAL #6   Title  ...            Plan - 01/11/19 1009    Clinical Impression Statement  The  patient reports a decrease in headache frequency following initial session however she continues to have intense headache.  She has several tender points in bilateral cervical musculature which could be a contributing factor to headaches.  Much improved soft tissue mobility  especially in suboccipitals following treatment session.  Therapist closely monitoring response with all treatment interventions.    Comorbidities  Lumbar fusion (Lx stenosis) 2018 Dr. Ellene Route; anxiety, allergies, arthritis, osteopenia, hx of migraines    PT Frequency  1x / week    PT Duration  8 weeks    PT Treatment/Interventions  ADLs/Self Care Home Management;Cryotherapy;Electrical Stimulation;Ultrasound;Traction;Moist Heat;Therapeutic activities;Therapeutic exercise;Neuromuscular re-education;Manual techniques;Patient/family education;Dry needling;Taping    PT Next Visit Plan  assess response to DN and continue as needed;  manual soft tissue work and cervical joint mobs;  thoracic extension;  initiate postural strengthening    PT Home Exercise Plan  Access Code: [place]       Patient will benefit from skilled therapeutic intervention in order to improve the following deficits and impairments:  Increased fascial restricitons, Increased muscle spasms, Pain, Decreased strength, Postural dysfunction, Decreased range of motion  Visit Diagnosis: Cervicalgia  Muscle weakness (generalized)  Other headache syndrome    PHYSICAL THERAPY DISCHARGE SUMMARY  Visits from Start of Care: 2  Current functional level related to goals / functional outcomes: The patient called to cancel remaining appts stating she was feeling better and did not think she needed to return.   Remaining deficits: As above   Education / Equipment: Basic self care Plan: Patient agrees to discharge.  Patient goals were partially met. Patient is being discharged due to the patient's request.  ?????        Problem List Patient Active Problem  List   Diagnosis Date Noted  . Myofascial pain 03/27/2018  . Numbness of left foot 11/07/2017  . Right foot pain 09/12/2017  . Deviated septum 01/25/2017  . ETD (Eustachian tube dysfunction), bilateral 01/25/2017  . Seasonal allergic rhinitis 01/25/2017  . Bilateral hearing loss 01/25/2017  . S/P lumbar fusion 06/20/2016  . 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 Fatty liver 12/22/2015  . Onychodystrophy 11/24/2015  . Prepatellar bursitis of right knee 08/18/2014  . Hyperlipidemia 12/17/2013  . Osteoarthritis of left knee 07/05/2013  . Obesity 03/10/2011  . Dyspepsia 02/10/2011  . Pulmonary nodule 12/10/2010  . PALPITATIONS 02/11/2010  . OSTEOPENIA 11/27/2009  . CONSTIPATION, CHRONIC 08/15/2008  . NECK PAIN 04/11/2008  . Disorder of kidney and ureter 01/21/2008  . HEMORRHOIDS, INTERNAL 01/16/2008  . DIVERTICULOSIS, COLON 01/16/2008  . POSTMENOPAUSAL STATUS 12/05/2007  . MICROSCOPIC HEMATURIA 06/05/2007  . STENOSIS,  LUMBAR SPINE 12/22/2005  . ANXIETY 11/15/2005  . IRRITABLE BOWEL SYNDROME 11/15/2005  . ARTHRITIS 11/15/2005   Ruben Im, PT 01/11/19 11:32 AM Phone: [phone] Fax: [phone] Alvera Singh 01/11/2019, 11:32 AM  [facility] [address], [place], [place], [address] Phone: [phone]   Fax:  [phone]  Name: Cheryl [name] MRN: [id number] Date of Birth: 06-13-1940

## 2019-01-18 ENCOUNTER — Encounter: Payer: PPO | Admitting: Physical Therapy

## 2019-01-18 ENCOUNTER — Other Ambulatory Visit: Payer: Self-pay | Admitting: Family Medicine

## 2019-01-22 ENCOUNTER — Ambulatory Visit: Payer: PPO | Admitting: Sports Medicine

## 2019-01-25 ENCOUNTER — Encounter: Payer: PPO | Admitting: Physical Therapy

## 2019-01-29 ENCOUNTER — Encounter: Payer: PPO | Admitting: Physical Therapy

## 2019-02-07 ENCOUNTER — Encounter: Payer: Self-pay | Admitting: Family Medicine

## 2019-02-22 ENCOUNTER — Ambulatory Visit: Payer: PPO

## 2019-03-01 ENCOUNTER — Ambulatory Visit: Payer: PPO | Attending: Internal Medicine

## 2019-03-01 DIAGNOSIS — Z23 Encounter for immunization: Secondary | ICD-10-CM | POA: Insufficient documentation

## 2019-03-01 NOTE — Progress Notes (Signed)
   Covid-19 Vaccination Clinic  Name:  Cheryl [name]    MRN: [id number] DOB: 09-May-1940  03/01/2019  [name] was observed post Covid-19 immunization for 15 minutes without incidence. She was provided with Vaccine Information Sheet and instruction to access the V-Safe system.   [name] was instructed to call 911 with any severe reactions post vaccine: Marland Kitchen Difficulty breathing  . Swelling of your face and throat  . A fast heartbeat  . A bad rash all over your body  . Dizziness and weakness    Immunizations Administered    Name Date Dose VIS Date Route   Pfizer COVID-19 Vaccine 03/01/2019 11:42 AM 0.3 mL 01/18/2019 Intramuscular   Manufacturer: [place]   Lot: [id number]   [place]: [id number]

## 2019-03-18 ENCOUNTER — Encounter: Payer: Self-pay | Admitting: Family Medicine

## 2019-03-19 NOTE — Telephone Encounter (Signed)
Let get her on for a virtual visit so I had have documentation for possible MRI, but we will have to at least start with an xray of the neck which I didn't see one on fille in the last 5 years.

## 2019-03-20 NOTE — Telephone Encounter (Signed)
Appointment has been made. No further questions at this time.  

## 2019-03-22 ENCOUNTER — Telehealth (INDEPENDENT_AMBULATORY_CARE_PROVIDER_SITE_OTHER): Payer: PPO | Admitting: Family Medicine

## 2019-03-22 ENCOUNTER — Ambulatory Visit: Payer: PPO | Attending: Internal Medicine

## 2019-03-22 ENCOUNTER — Encounter: Payer: Self-pay | Admitting: Family Medicine

## 2019-03-22 VITALS — Ht 65.0 in | Wt 182.7 lb

## 2019-03-22 DIAGNOSIS — M542 Cervicalgia: Secondary | ICD-10-CM

## 2019-03-22 DIAGNOSIS — Z23 Encounter for immunization: Secondary | ICD-10-CM | POA: Insufficient documentation

## 2019-03-22 DIAGNOSIS — G43909 Migraine, unspecified, not intractable, without status migrainosus: Secondary | ICD-10-CM | POA: Insufficient documentation

## 2019-03-22 DIAGNOSIS — G43809 Other migraine, not intractable, without status migrainosus: Secondary | ICD-10-CM

## 2019-03-22 DIAGNOSIS — R519 Headache, unspecified: Secondary | ICD-10-CM

## 2019-03-22 MED ORDER — AMITRIPTYLINE HCL 25 MG PO TABS: 25.0000 mg | ORAL_TABLET | Freq: Every day | ORAL | 0 refills | Status: DC

## 2019-03-22 NOTE — Progress Notes (Signed)
Pt states that she feels that she is experiencing more headaches. She feels that they may be coming from her neck.  She feels that the headaches have gotten worse since quarantine(~1 yr). She deines really having any visual changes. The pain starts in her jaw and her neck. She had back surgery and was advised that she has Arthritis in her neck. She said that she was told that she cannot take medications for her migraines due to the "white spots" on her brain. She has been using heat on her neck and a cold compress for her head.

## 2019-03-22 NOTE — Assessment & Plan Note (Signed)
Ports that she did have an MRI back in either late 2017 or early 2018.  I do not have a copy of it sounds like it was done with Dr. Clarice Pole office so we may have to call and try to get a copy of that report.  We will go ahead and move forward with getting plain films of her cervical spine and then MRI if needed.

## 2019-03-22 NOTE — Assessment & Plan Note (Addendum)
Discussed Options.  I think she will actually be a great candidate for something like amitriptyline at bedtime or even Topamax.  We will start with amitriptyline and see if over the next few weeks she feels like this is actually helping reduce the frequency duration and intensity of the headaches.  He has actually never tried a triptan.  She was told that because she had "white spots" on her brain she could not try a triptan.  We will start with a low-dose of amitriptyline she is on Paxil but she is on a low dose so the potential for interaction is very low but will monitor carefully.

## 2019-03-22 NOTE — Assessment & Plan Note (Signed)
See note above.  I think this could be related to her migraines but definitely could be related from cervical pain.  She might be a good candidate for other options such as Botox etc.  She did go to 2 physical therapy sessions but has not been back since then.

## 2019-03-22 NOTE — Progress Notes (Signed)
Virtual Visit via Video Note  I connected with Cheryl [name] on 03/22/19 at  1:40 PM EST by a video enabled telemedicine application and verified that I am speaking with the correct person using two identifiers.   I discussed the limitations of evaluation and management by telemedicine and the availability of in person appointments. The patient expressed understanding and agreed to proceed.  Subjective:    CC: Daily Headaches  HPI: Pt states that she feels that she is experiencing more headaches.  She does have a history of migraines.  She feels that they may be coming from her neck.  She is had a lot of tension and tightness in her neck and is noticed a decrease in rotation to the left.  She feels like using heat in the mornings does make a difference she said it usually will delay the onset of the headache.  But it usually inevitable at some point during the day it starts and then it lasts until she goes to bed.  Some days it is more severe than others.  She did go to physical therapy twice and actually had tried needling done the second time but says she actually feels like it caused more pain.  She has tried over-the-counter medications for the neck pain as well as tramadol.  She feels that the headaches have gotten worse since quarantine(~1 yr). She deines really having any visual changes. The pain starts in her jaw and her neck. She had back surgery and was advised that she has Arthritis in her neck. She said that she was told that she cannot take medications for her migraines due to the "white spots" on her brain. She has been using heat on her neck and a cold compress for her head.   She says the headaches can move around the can be on the top of the head the side of the head or the back of the head.  Sometimes they are behind her eyes.  When it is behind her eyes it is usually more on one side.  She is not currently on any medication for migraine headaches.   In regards to her migraines  in the past she has had what sounds like trigger point injections done.  That sounds like she has never tried Botox.  She has never tried Topamax.  She thinks she may have tried amitriptyline as it sounds familiar but she is just not sure and she has never been placed on a triptan.  Past medical history, Surgical history, Family history not pertinant except as noted below, Social history, Allergies, and medications have been entered into the medical record, reviewed, and corrections made.   Review of Systems: No fevers, chills, night sweats, weight loss, chest pain, or shortness of breath.   Objective:    General: Speaking clearly in complete sentences without any shortness of breath.  Alert and oriented x3.  Normal judgment. No apparent acute distress.    Impression and Recommendations:    Migraine headache Discussed Options.  I think she will actually be a great candidate for something like amitriptyline at bedtime or even Topamax.  We will start with amitriptyline and see if over the next few weeks she feels like this is actually helping reduce the frequency duration and intensity of the headaches.  He has actually never tried a triptan.  She was told that because she had "white spots" on her brain she could not try a triptan.  We will start with a low-dose  of amitriptyline she is on Paxil but she is on a low dose so the potential for interaction is very low but will monitor carefully.  Daily headache See note above.  I think this could be related to her migraines but definitely could be related from cervical pain.  She might be a good candidate for other options such as Botox etc.  She did go to 2 physical therapy sessions but has not been back since then.  NECK PAIN Ports that she did have an MRI back in either late 2017 or early 2018.  I do not have a copy of it sounds like it was done with Dr. Clarice Pole office so we may have to call and try to get a copy of that report.  We will go ahead  and move forward with getting plain films of her cervical spine and then MRI if needed.   Time spent 25 min in encounter.      I discussed the assessment and treatment plan with the patient. The patient was provided an opportunity to ask questions and all were answered. The patient agreed with the plan and demonstrated an understanding of the instructions.   The patient was advised to call back or seek an in-person evaluation if the symptoms worsen or if the condition fails to improve as anticipated.   [name], MD

## 2019-03-22 NOTE — Progress Notes (Signed)
   Covid-19 Vaccination Clinic  Name:  Cheryl [name]    MRN: [id number] DOB: 11/05/1940  03/22/2019  [name] was observed post Covid-19 immunization for 15 minutes without incidence. She was provided with Vaccine Information Sheet and instruction to access the V-Safe system.   [name] was instructed to call 911 with any severe reactions post vaccine: Marland Kitchen Difficulty breathing  . Swelling of your face and throat  . A fast heartbeat  . A bad rash all over your body  . Dizziness and weakness    Immunizations Administered    Name Date Dose VIS Date Route   Pfizer COVID-19 Vaccine 03/22/2019  5:20 PM 0.3 mL 01/18/2019 Intramuscular   Manufacturer: [place]   Lot: [id number]   [place]: [id number]

## 2019-03-25 NOTE — Telephone Encounter (Signed)
Call Dr. Clarice Pole office of the see if they can send [name] a copy of the report.

## 2019-03-26 ENCOUNTER — Ambulatory Visit (INDEPENDENT_AMBULATORY_CARE_PROVIDER_SITE_OTHER): Payer: PPO

## 2019-03-26 ENCOUNTER — Other Ambulatory Visit: Payer: Self-pay

## 2019-03-26 DIAGNOSIS — R519 Headache, unspecified: Secondary | ICD-10-CM | POA: Diagnosis not present

## 2019-03-26 DIAGNOSIS — M542 Cervicalgia: Secondary | ICD-10-CM | POA: Diagnosis not present

## 2019-03-27 ENCOUNTER — Encounter: Payer: Self-pay | Admitting: Family Medicine

## 2019-03-27 DIAGNOSIS — M542 Cervicalgia: Secondary | ICD-10-CM

## 2019-04-01 ENCOUNTER — Other Ambulatory Visit: Payer: Self-pay | Admitting: Family Medicine

## 2019-04-01 DIAGNOSIS — F419 Anxiety disorder, unspecified: Secondary | ICD-10-CM

## 2019-04-04 NOTE — Addendum Note (Signed)
Addended by: [name] D on: 04/04/2019 05:04 PM   Modules accepted: Orders

## 2019-04-10 ENCOUNTER — Encounter: Payer: Self-pay | Admitting: Family Medicine

## 2019-04-14 ENCOUNTER — Ambulatory Visit: Payer: PPO

## 2019-04-15 DIAGNOSIS — R519 Headache, unspecified: Secondary | ICD-10-CM | POA: Diagnosis not present

## 2019-04-15 DIAGNOSIS — M47812 Spondylosis without myelopathy or radiculopathy, cervical region: Secondary | ICD-10-CM | POA: Diagnosis not present

## 2019-04-17 ENCOUNTER — Telehealth (INDEPENDENT_AMBULATORY_CARE_PROVIDER_SITE_OTHER): Payer: PPO | Admitting: Medical-Surgical

## 2019-04-17 ENCOUNTER — Encounter: Payer: Self-pay | Admitting: Medical-Surgical

## 2019-04-17 DIAGNOSIS — J0100 Acute maxillary sinusitis, unspecified: Secondary | ICD-10-CM | POA: Diagnosis not present

## 2019-04-17 MED ORDER — AMOXICILLIN-POT CLAVULANATE 875-125 MG PO TABS: 1.0000 | ORAL_TABLET | Freq: Two times a day (BID) | ORAL | 0 refills | Status: DC

## 2019-04-17 NOTE — Progress Notes (Signed)
Virtual Visit via Video Note  I connected with Cheryl [name] on 04/17/19 at 10:10 AM EST by a video enabled telemedicine application and verified that I am speaking with the correct person using two identifiers.   I discussed the limitations of evaluation and management by telemedicine and the availability of in person appointments. The patient expressed understanding and agreed to proceed.  Subjective:    CC: Sinus symptoms  HPI: Pleasant 79 year old female presenting via MyChart video visit with reports of sinus symptoms x1 week. (+) Sinus pain/pressure, facial pain/pressure, frontal headaches, postnasal drip, sore throat, rhinorrhea, cough productive of small amounts of yellow sputum, fatigue, and body aches.  Also reports submandibular lymph node tenderness and swelling.  Denies fevers, chills, shortness of breath.  Using Flonase and saline nasal sprays without relief.  Also tried over-the-counter sinus congestion medications without relief.  Reports posterior oropharyngeal erythema.  Has had 2 doses of Pfizer Covid vaccine.   Past medical history, Surgical history, Family history not pertinant except as noted below, Social history, Allergies, and medications have been entered into the medical record, reviewed, and corrections made.   Review of Systems: No fevers, chills, night sweats, weight loss, chest pain, or shortness of breath.   Objective:    General: Speaking clearly in complete sentences without any shortness of breath.  Alert and oriented x3.  Normal judgment. No apparent acute distress.  Impression and Recommendations:    1. Acute non-recurrent maxillary sinusitis Treating with Augmentin BID x 10 days. Already has cough medication at home. May continue Saline and Flonase sprays as needed. - amoxicillin-clavulanate (AUGMENTIN) 875-125 MG tablet; Take 1 tablet by mouth 2 (two) times daily for 10 days.  Dispense: 20 tablet; Refill: 0  Return if symptoms worsen or fail to  improve.  I discussed the assessment and treatment plan with the patient. The patient was provided an opportunity to ask questions and all were answered. The patient agreed with the plan and demonstrated an understanding of the instructions.   The patient was advised to call back or seek an in-person evaluation if the symptoms worsen or if the condition fails to improve as anticipated.  25 minutes of non-face-to-face time was provided during this encounter.  Clearnce Sorrel, DNP, APRN, FNP-BC [facility]

## 2019-04-25 ENCOUNTER — Other Ambulatory Visit: Payer: Self-pay

## 2019-04-25 ENCOUNTER — Ambulatory Visit (INDEPENDENT_AMBULATORY_CARE_PROVIDER_SITE_OTHER): Payer: PPO | Admitting: Family Medicine

## 2019-04-25 ENCOUNTER — Encounter: Payer: Self-pay | Admitting: Family Medicine

## 2019-04-25 VITALS — BP 138/54 | HR 110 | Ht 65.0 in | Wt 188.0 lb

## 2019-04-25 DIAGNOSIS — F418 Other specified anxiety disorders: Secondary | ICD-10-CM

## 2019-04-25 DIAGNOSIS — J029 Acute pharyngitis, unspecified: Secondary | ICD-10-CM | POA: Diagnosis not present

## 2019-04-25 MED ORDER — AZITHROMYCIN 250 MG PO TABS: ORAL_TABLET | ORAL | 0 refills | Status: AC

## 2019-04-25 MED ORDER — NYSTATIN 100000 UNIT/ML MT SUSP: 5.0000 mL | Freq: Four times a day (QID) | OROMUCOSAL | 0 refills | Status: DC

## 2019-04-25 NOTE — Progress Notes (Signed)
Acute Office Visit  Subjective:    Patient ID: Cheryl [name], female    DOB: [birth year], 79 y.o.   MRN: [id number]  Chief Complaint  Patient presents with  . Sore Throat  . Fatigue    HPI Patient is in today for sore throat.  She is actually had a sore throat for a couple of months now.  In fact more recently she started to get some nasal congestion and did a virtual visit and was treated for sinusitis.  She is actually on day 8 out of 10 of her Augmentin and says it does not really seem any better.  She still coughing up some occasional yellow phlegm which she feels is coming from the throat area.  She also feels just very fatigued.  He also feels shaky at times.  She feels like her stress and depression levels are elevated.  Being in most of the year not being able to get out meet with friends has been really difficult and emotionally hard on her.  She now has been vaccinated and her friends have been vaccinated so they are just waiting the BRCA toward 2 weeks before getting around each other so she is looking forward to that.  Her lack of medication has made it hard for her to even just get out her front door and sat on her porch.  She has not really been taking her dog for walks.  Past Medical History:  Diagnosis Date  . Anemia   . Anxiety    d/t recent family issues  . Arthritis   . Bone spur   . Chronic back pain    stenosis and OA  . Depression    takes Paxil daily  . Diverticulosis   . Dizzy    occasionally  . Fatty liver    has had 2 Hep shots and the final one is in June 18  . GERD (gastroesophageal reflux disease)    takes Omeprazole daily  . Hepatitis 2012  . History of bronchitis    20-25 yrs ago  . History of colon polyps    precancerous  . History of shingles   . Hyperlipidemia    takes Simvastatin daily  . IBS (irritable bowel syndrome)    more on side of constipation-takes Miralax daily  . Insomnia    takes Melatonin and Ambien nightly  . Internal  hemorrhoids   . Joint pain   . Joint swelling   . Lung nodules    calcified   . Migraine headache    migraines-thinks coming from neck.Last one1/28/18  . Pneumonia    hx of-20 yrs ago-=-walking  . Renal cyst    left  . Uterine cancer ([place]) 1973    Past Surgical History:  Procedure Laterality Date  . APPENDECTOMY  1966  . cataracts     bilateral  . COLONOSCOPY    . EUS  02/10/2011   Procedure: UPPER ENDOSCOPIC ULTRASOUND (EUS) RADIAL;  Surgeon: Owens Loffler, MD;  Location: [facility];  Service: Endoscopy;  Laterality: N/A;  radial linear   . LUMBAR FUSION  03/2016   L4-5, Dr. Ola Spurr.  Marland Kitchen RECTOCELE REPAIR  2011  . ROTATOR CUFF REPAIR  2009   left  . TONSILLECTOMY    . UPPER GASTROINTESTINAL ENDOSCOPY    . VAGINAL HYSTERECTOMY  1973    Family History  Problem Relation Age of Onset  . Alcohol abuse Father   . Lung cancer Father  smoker  . Kidney failure Mother        med induced  . Rheum arthritis Mother   . Depression Brother   . Heart disease Other        Paternal family   . Heart failure Daughter   . Lung cancer Son   . Crohn's disease Brother   . Alcohol abuse Brother   . HIV Son   . Malignant hyperthermia Neg Hx   . Colon cancer Neg Hx   . Esophageal cancer Neg Hx   . Rectal cancer Neg Hx   . Stomach cancer Neg Hx     Social History   Socioeconomic History  . Marital status: Legally Separated    Spouse name: Not on file  . Number of children: 4  . Years of education: Not on file  . Highest education level: Not on file  Occupational History  . Occupation: Retired      Fish farm manager: RETIRED  Tobacco Use  . Smoking status: Former Smoker    Quit date: 02/07/1993    Years since quitting: 26.2  . Smokeless tobacco: Never Used  Substance and Sexual Activity  . Alcohol use: No  . Drug use: No  . Sexual activity: Not on file  Other Topics Concern  . Not on file  Social History Narrative   Walking daily.              Epworth Sleepiness  Scale = 1 (as of 04/10/15)   Social Determinants of Health   Financial Resource Strain:   . Difficulty of Paying Living Expenses:   Food Insecurity:   . Worried About Charity fundraiser in the Last Year:   . Arboriculturist in the Last Year:   Transportation Needs:   . Film/video editor (Medical):   Marland Kitchen Lack of Transportation (Non-Medical):   Physical Activity:   . Days of Exercise per Week:   . Minutes of Exercise per Session:   Stress:   . Feeling of Stress :   Social Connections:   . Frequency of Communication with Friends and Family:   . Frequency of Social Gatherings with Friends and Family:   . Attends Religious Services:   . Active Member of Clubs or Organizations:   . Attends Archivist Meetings:   Marland Kitchen Marital Status:   Intimate Partner Violence:   . Fear of Current or Ex-Partner:   . Emotionally Abused:   Marland Kitchen Physically Abused:   . Sexually Abused:     Outpatient Medications Prior to Visit  Medication Sig Dispense Refill  . ALPRAZolam (XANAX) 0.5 MG tablet TAKE 1 TABLET BY MOUTH AT BEDTIME AS NEEDED FOR ANXIETY 15 tablet 0  . amitriptyline (ELAVIL) 25 MG tablet Take 1 tablet (25 mg total) by mouth at bedtime. For Migraines 30 tablet 0  . aspirin 81 MG tablet Take 81 mg by mouth daily.      . Cholecalciferol (VITAMIN D) 1000 UNITS capsule Take 1,000 Units by mouth daily.      . fluticasone (FLONASE) 50 MCG/ACT nasal spray INHALE 2 SPRAYS INTO EACH NOSTRIL EVERY DAY 48 mL 1  . hydrOXYzine (VISTARIL) 25 MG capsule TAKE 1 CAPSULE (25 MG TOTAL) BY MOUTH 3 (THREE) TIMES DAILY AS NEEDED FOR ITCHING. 270 capsule 1  . MAGNESIUM PO Take 1 tablet by mouth daily.     Marland Kitchen omeprazole (PRILOSEC) 20 MG capsule TAKE 1 CAPSULE TWICE DAILY BEFORE A MEAL 180 capsule 3  . PARoxetine (PAXIL)  20 MG tablet TAKE 1 TABLET BY MOUTH EVERY DAY IN THE MORNING 90 tablet 1  . polyethylene glycol (MIRALAX / GLYCOLAX) packet Take 17 g by mouth daily.    . RESTASIS 0.05 % ophthalmic emulsion  Place 1 drop into both eyes 2 (two) times daily.     . traMADol (ULTRAM) 50 MG tablet Take 1 tablet (50 mg total) by mouth every 12 (twelve) hours as needed. 60 tablet 0  . zolpidem (AMBIEN) 5 MG tablet TAKE 1 TABLET (5 MG TOTAL) BY MOUTH AT BEDTIME AS NEEDED. FOR SLEEP 30 tablet 1  . amoxicillin-clavulanate (AUGMENTIN) 875-125 MG tablet Take 1 tablet by mouth 2 (two) times daily for 10 days. 20 tablet 0   No facility-administered medications prior to visit.    Allergies  Allergen Reactions  . Fluoxetine Hcl Shortness Of Breath and Itching    REACTION: itching,SOB  . Contrast Media [Iodinated Diagnostic Agents] Itching    Multilance.   . Dexamethasone Diarrhea and Nausea And Vomiting  . Livalo [Pitavastatin] Other (See Comments)    Myalgia   . Metrizamide Itching    Multilance.   . Nitroglycerin     Nausea,dizziness,sweats  . Rosuvastatin Other (See Comments)    bodyaches and cramping  . Simvastatin Other (See Comments)    Muscle aches and HA  . Codeine Sulfate Itching    REACTION: itching  . Sulfasalazine Nausea Only    REACTION: Nausea  . Sulfonamide Derivatives Nausea Only    REACTION: Nausea    Review of Systems     Objective:    Physical Exam Constitutional:      Appearance: She is well-developed.  HENT:     Head: Normocephalic and atraumatic.     Right Ear: External ear normal.     Left Ear: External ear normal.     Nose: Nose normal.  Eyes:     Conjunctiva/sclera: Conjunctivae normal.     Pupils: Pupils are equal, round, and reactive to light.  Neck:     Thyroid: No thyromegaly.  Cardiovascular:     Rate and Rhythm: Normal rate and regular rhythm.     Heart sounds: Normal heart sounds.  Pulmonary:     Effort: Pulmonary effort is normal.     Breath sounds: Normal breath sounds. No wheezing.  Musculoskeletal:     Cervical back: Neck supple.  Lymphadenopathy:     Cervical: No cervical adenopathy.  Skin:    General: Skin is warm and dry.   Neurological:     Mental Status: She is alert and oriented to person, place, and time.  Psychiatric:        Behavior: Behavior normal.     BP (!) 138/54   Pulse (!) 110   Ht 5' 5"  (1.651 m)   Wt 188 lb (85.3 kg)   SpO2 95%   BMI 31.28 kg/m  Wt Readings from Last 3 Encounters:  04/25/19 188 lb (85.3 kg)  03/22/19 182 lb 11.2 oz (82.9 kg)  12/31/18 185 lb (83.9 kg)    There are no preventive care reminders to display for this patient.  There are no preventive care reminders to display for this patient.   Lab Results  Component Value Date   TSH 3.06 12/20/2018   Lab Results  Component Value Date   WBC 7.1 09/26/2017   HGB 15.3 09/26/2017   HCT 46.5 (H) 09/26/2017   MCV 84.1 09/26/2017   PLT 279 09/26/2017   Lab Results  Component Value Date  NA 139 07/31/2018   K 4.3 07/31/2018   CO2 26 07/31/2018   GLUCOSE 103 (H) 07/31/2018   BUN 12 07/31/2018   CREATININE 0.82 07/31/2018   BILITOT 0.4 07/31/2018   ALKPHOS 98 08/14/2017   AST 24 07/31/2018   ALT 18 07/31/2018   PROT 6.5 07/31/2018   ALBUMIN 4.1 08/14/2017   CALCIUM 9.6 07/31/2018   ANIONGAP 9 03/07/2016   Lab Results  Component Value Date   CHOL 201 (H) 10/04/2018   Lab Results  Component Value Date   HDL 41 (L) 10/04/2018   Lab Results  Component Value Date   LDLCALC 121 (H) 10/04/2018   Lab Results  Component Value Date   TRIG 253 (H) 10/04/2018   Lab Results  Component Value Date   CHOLHDL 4.9 10/04/2018   Lab Results  Component Value Date   HGBA1C 5.8 12/17/2013       Assessment & Plan:   Problem List Items Addressed This Visit    None    Visit Diagnoses    Pharyngitis, unspecified etiology    -  Primary   Depression with anxiety         Pharyngitis-unclear etiology as it has persisted for a few months at this point.  She is not improving with Augmentin so we will switch to azithromycin and also treat her with nystatin swish and swallow in case some component of this  could be thrush though I do not see any evidence of that in her mouth.  If she is not improved after that then will refer her to ENT for further work-up and evaluation.  Depression with anxiety-hopefully in the next couple weeks as he gets warmer outside and spring hits and she is able to get around friends it will make a big difference I think this is the major driver for her recent increase in depressive symptoms.  Just really encouraged her to try to get out sit on her porch try to take her dog for at least a short walk and get outside and get some fresh air I think this would really make a big difference.  If she still not improving then we can consider increasing her Paxil.   Meds ordered this encounter  Medications  . azithromycin (ZITHROMAX) 250 MG tablet    Sig: 2 Ttabs PO on Day 1, then one a day x 4 days.    Dispense:  6 tablet    Refill:  0  . nystatin (MYCOSTATIN) 100000 UNIT/ML suspension    Sig: Take 5 mLs (500,000 Units total) by mouth 4 (four) times daily. X 1 week. Swish and hold in mouth for at least 2 minutes and then swallow.    Dispense:  473 mL    Refill:  0     [name], MD

## 2019-04-26 ENCOUNTER — Encounter: Payer: Self-pay | Admitting: Family Medicine

## 2019-05-02 ENCOUNTER — Other Ambulatory Visit: Payer: Self-pay | Admitting: Family Medicine

## 2019-05-02 DIAGNOSIS — G43809 Other migraine, not intractable, without status migrainosus: Secondary | ICD-10-CM

## 2019-05-04 IMAGING — MG DIGITAL SCREENING BILATERAL MAMMOGRAM WITH TOMO AND CAD
8 series · 9 of 24 positions shown · non-contrast
Comparison: Previous exam(s).

CLINICAL DATA: Screening.

EXAM:
DIGITAL SCREENING BILATERAL MAMMOGRAM WITH TOMO AND CAD

[R MLO synth-2D]
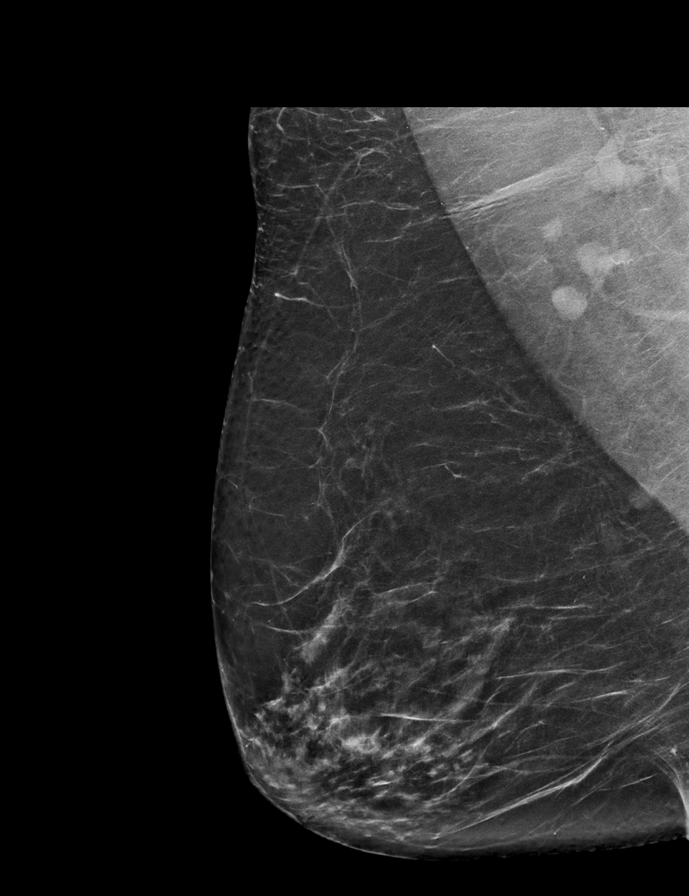

[R CC synth-2D]
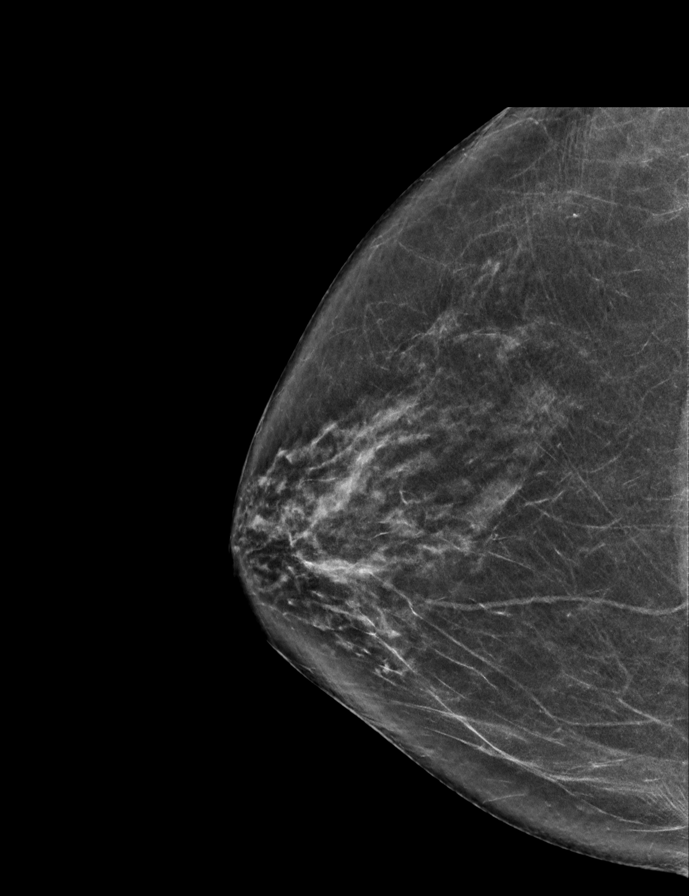

[L CC synth-2D]
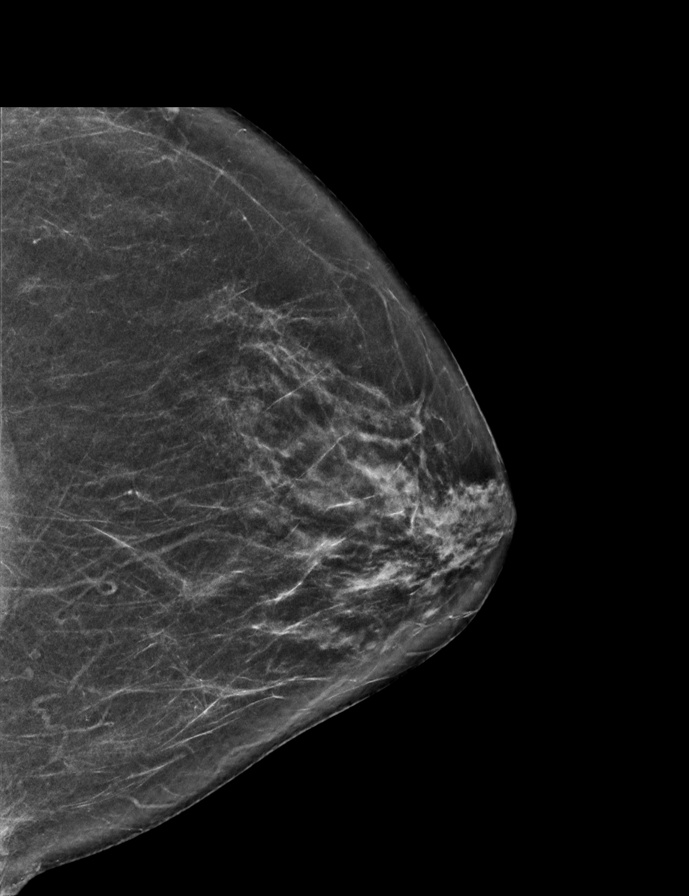

[L MLO synth-2D]
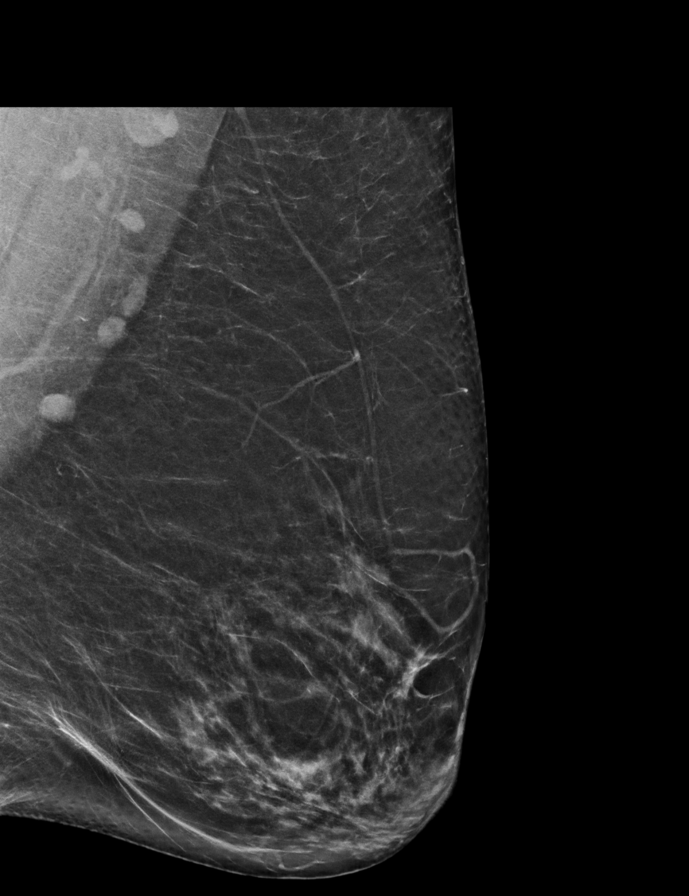

[R MLO tomo · 2 of 76 frames shown]
[frame 25/76]
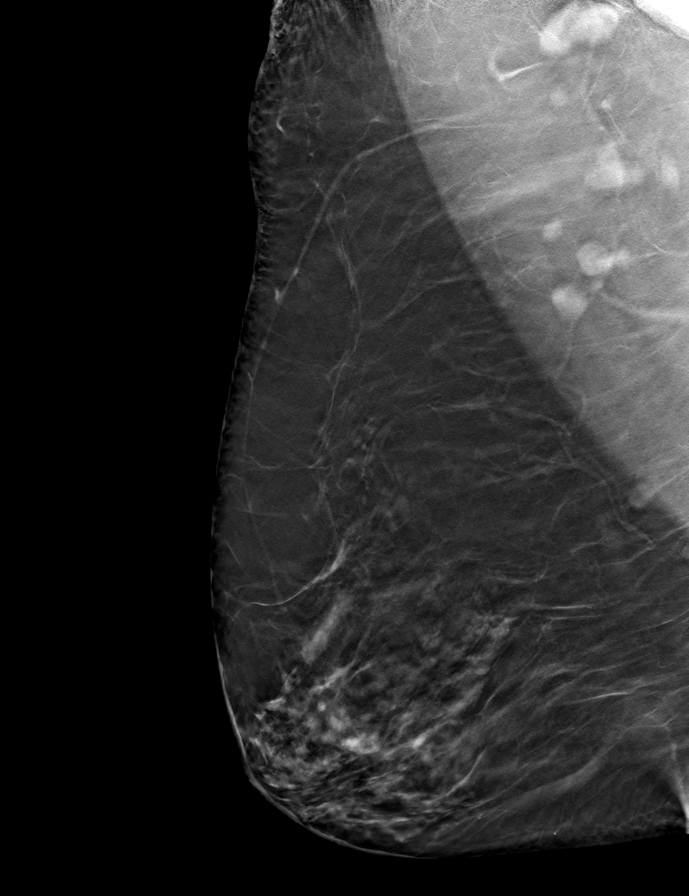
[frame 39/76]
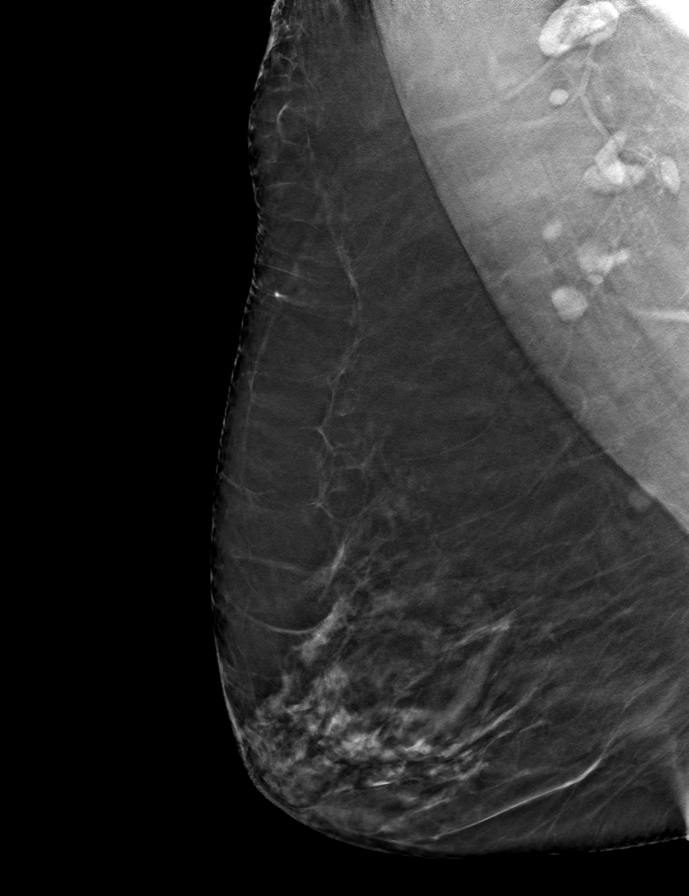

[R CC tomo · tomo slice 34/67.0]
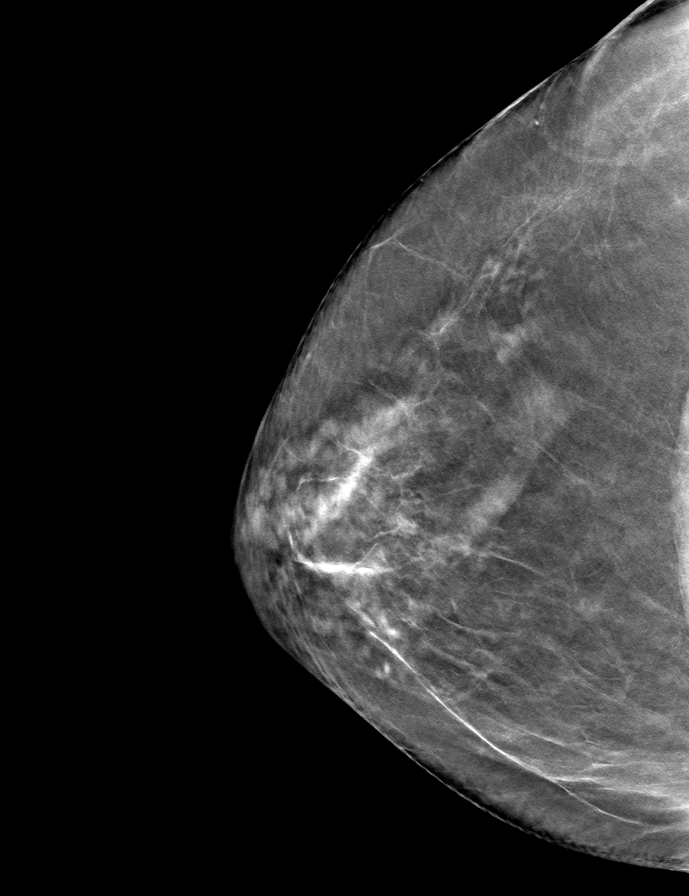

[L CC tomo · tomo slice 32/63.0]
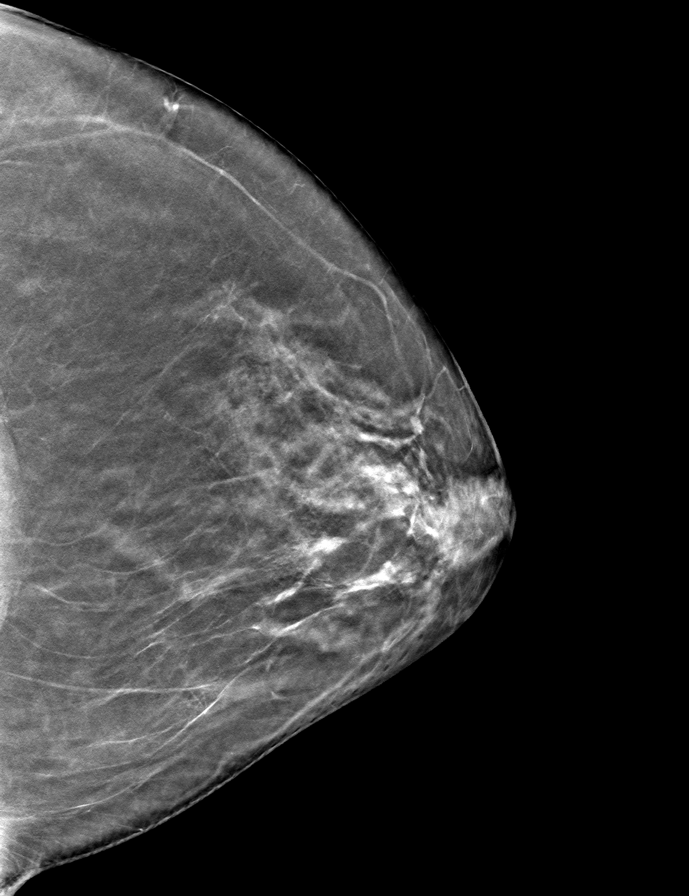

[L MLO tomo · tomo slice 35/69.0]
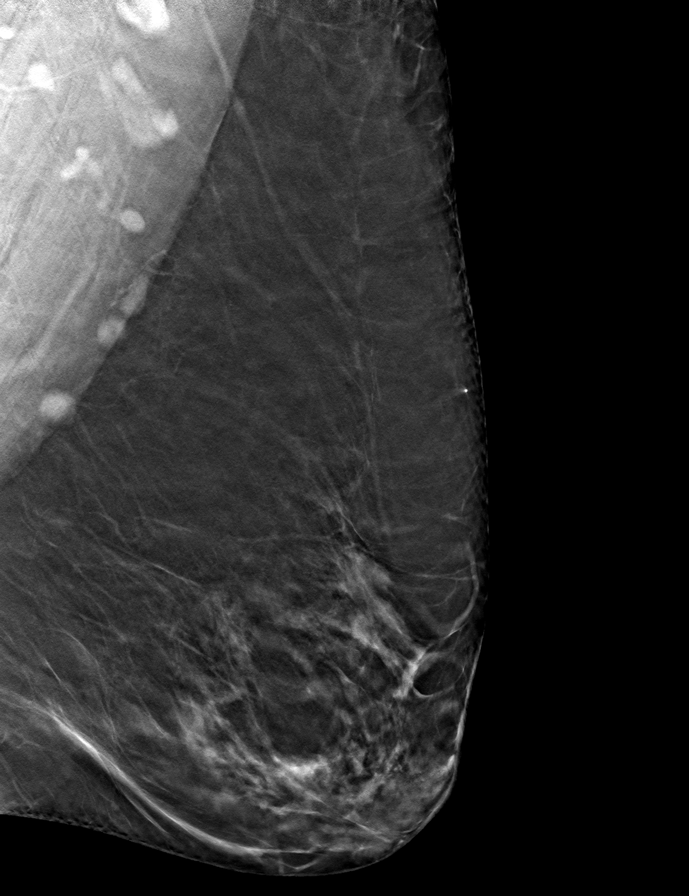

[9 of 24 positions shown; findings below may reference images not displayed]

ACR Breast Density Category b: There are scattered areas of
fibroglandular density.
FINDINGS: There are no findings suspicious for malignancy. Images were
processed with CAD.
IMPRESSION: No mammographic evidence of malignancy. A result letter of this
screening mammogram will be mailed directly to the patient.

RECOMMENDATION:
Screening mammogram in one year. (Code:[id number])

BI-RADS CATEGORY  1: Negative.

## 2019-05-07 ENCOUNTER — Other Ambulatory Visit: Payer: Self-pay | Admitting: Family Medicine

## 2019-05-07 ENCOUNTER — Other Ambulatory Visit: Payer: Self-pay | Admitting: Sports Medicine

## 2019-05-14 DIAGNOSIS — M47812 Spondylosis without myelopathy or radiculopathy, cervical region: Secondary | ICD-10-CM | POA: Diagnosis not present

## 2019-05-15 DIAGNOSIS — R0989 Other specified symptoms and signs involving the circulatory and respiratory systems: Secondary | ICD-10-CM | POA: Diagnosis not present

## 2019-05-15 DIAGNOSIS — K219 Gastro-esophageal reflux disease without esophagitis: Secondary | ICD-10-CM | POA: Diagnosis not present

## 2019-05-15 DIAGNOSIS — J310 Chronic rhinitis: Secondary | ICD-10-CM | POA: Diagnosis not present

## 2019-05-15 DIAGNOSIS — R09A2 Foreign body sensation, throat: Secondary | ICD-10-CM | POA: Insufficient documentation

## 2019-05-15 DIAGNOSIS — J302 Other seasonal allergic rhinitis: Secondary | ICD-10-CM | POA: Diagnosis not present

## 2019-05-27 ENCOUNTER — Other Ambulatory Visit: Payer: Self-pay | Admitting: Family Medicine

## 2019-05-28 DIAGNOSIS — M47812 Spondylosis without myelopathy or radiculopathy, cervical region: Secondary | ICD-10-CM | POA: Diagnosis not present

## 2019-05-28 DIAGNOSIS — R519 Headache, unspecified: Secondary | ICD-10-CM | POA: Diagnosis not present

## 2019-05-31 ENCOUNTER — Encounter: Payer: Self-pay | Admitting: Family Medicine

## 2019-05-31 NOTE — Telephone Encounter (Signed)
Routing to provider  

## 2019-06-03 ENCOUNTER — Ambulatory Visit
Admission: RE | Admit: 2019-06-03 | Discharge: 2019-06-03 | Disposition: A | Discharge status: Home / Self Care | Payer: PPO | Source: Ambulatory Visit | Attending: Family Medicine | Admitting: Family Medicine

## 2019-06-03 ENCOUNTER — Other Ambulatory Visit: Payer: Self-pay

## 2019-06-03 DIAGNOSIS — Z1231 Encounter for screening mammogram for malignant neoplasm of breast: Secondary | ICD-10-CM

## 2019-06-06 ENCOUNTER — Telehealth: Payer: Self-pay | Admitting: Radiology

## 2019-06-06 ENCOUNTER — Encounter: Payer: Self-pay | Admitting: Cardiovascular Disease

## 2019-06-06 ENCOUNTER — Telehealth (INDEPENDENT_AMBULATORY_CARE_PROVIDER_SITE_OTHER): Payer: PPO | Admitting: Cardiovascular Disease

## 2019-06-06 VITALS — BP 121/69 | HR 86 | Ht 65.0 in | Wt 185.0 lb

## 2019-06-06 DIAGNOSIS — R0602 Shortness of breath: Secondary | ICD-10-CM | POA: Diagnosis not present

## 2019-06-06 DIAGNOSIS — E7800 Pure hypercholesterolemia, unspecified: Secondary | ICD-10-CM

## 2019-06-06 DIAGNOSIS — I10 Essential (primary) hypertension: Secondary | ICD-10-CM

## 2019-06-06 DIAGNOSIS — R002 Palpitations: Secondary | ICD-10-CM

## 2019-06-06 DIAGNOSIS — I6523 Occlusion and stenosis of bilateral carotid arteries: Secondary | ICD-10-CM

## 2019-06-06 DIAGNOSIS — E785 Hyperlipidemia, unspecified: Secondary | ICD-10-CM | POA: Diagnosis not present

## 2019-06-06 DIAGNOSIS — R0609 Other forms of dyspnea: Secondary | ICD-10-CM

## 2019-06-06 DIAGNOSIS — R0600 Dyspnea, unspecified: Secondary | ICD-10-CM

## 2019-06-06 HISTORY — DX: Occlusion and stenosis of bilateral carotid arteries: I65.23

## 2019-06-06 NOTE — Progress Notes (Signed)
Virtual Visit via Video Note   This visit type was conducted due to national recommendations for restrictions regarding the COVID-19 Pandemic (e.g. social distancing) in an effort to limit this patient's exposure and mitigate transmission in our community.  Due to her co-morbid illnesses, this patient is at least at moderate risk for complications without adequate follow up.  This format is felt to be most appropriate for this patient at this time.  All issues noted in this document were discussed and addressed.  A limited physical exam was performed with this format.  Please refer to the patient's chart for her consent to telehealth for [facility].   The patient was identified using 2 identifiers.  Date:  06/06/2019   ID:  Cheryl [name], [place] 12-07-1940, MRN [id number]  Patient Location: Home Provider Location: Office  PCP:  Hali Marry, MD  Cardiologist:  [name], MD  Electrophysiologist:  None   Evaluation Performed:  Follow-Up Visit  Chief Complaint: Palpitations, shortness of breath  History of Present Illness:    Cheryl [name] is a 79 y.o. female with hyperlipidemia and mild carotid stenosis who presents for follow up.  She was first seen 04/2015 with a report of shortness of breath.  She had an echo 05/28/15 that revealed LVEF 55-60% with grade 1 diastolic dysfunction. She also had a Lexiscan Myoview 05/07/15 that revealed LVEF 60% with a small, fixed septal/anteroseptal defect that was felt to be artifact. There is no evidence of ischemia.  She has struggled with orthostatic hypotension and was instructed to wear compression stockings increase her salt intake. She underwent L4/L5 decompression surgery on 03/2016.  She then struggled to breathe due to her back brace.    Lately Ms. Gwathney notes that she is more short of breath.  She struggled through the pandemic and hasn't been very active.  She notices that her heart races whenever she gets up to walk.  Her  heart never races when she is at rest.  She started trying to walk some but she also struggles with back pain.  She has no lower extremity edema, orthopnea, or PND.  She reports some odd sensations in her right neck and wonders if it is worsening of her carotid stenosis.  She continues to struggle with her statin.  She has myalgias that she takes it more than once every 6 days.  She had both coronavirus vaccines.  The patient does not have symptoms concerning for COVID-19 infection (fever, chills, cough, or new shortness of breath).    Past Medical History:  Diagnosis Date  . Anemia   . Anxiety    d/t recent family issues  . Arthritis   . Bone spur   . Chronic back pain    stenosis and OA  . Depression    takes Paxil daily  . Diverticulosis   . Dizzy    occasionally  . Fatty liver    has had 2 Hep shots and the final one is in June 18  . GERD (gastroesophageal reflux disease)    takes Omeprazole daily  . Hepatitis 2012  . History of bronchitis    20-25 yrs ago  . History of colon polyps    precancerous  . History of shingles   . Hyperlipidemia    takes Simvastatin daily  . IBS (irritable bowel syndrome)    more on side of constipation-takes Miralax daily  . Insomnia    takes Melatonin and Ambien nightly  . Internal hemorrhoids   .  Joint pain   . Joint swelling   . Lung nodules    calcified   . Migraine headache    migraines-thinks coming from neck.Last one1/28/18  . Pneumonia    hx of-20 yrs ago-=-walking  . Renal cyst    left  . Uterine cancer ([place]) 1973   Past Surgical History:  Procedure Laterality Date  . APPENDECTOMY  1966  . cataracts     bilateral  . COLONOSCOPY    . EUS  02/10/2011   Procedure: UPPER ENDOSCOPIC ULTRASOUND (EUS) RADIAL;  Surgeon: Owens Loffler, MD;  Location: [facility];  Service: Endoscopy;  Laterality: N/A;  radial linear   . LUMBAR FUSION  03/2016   L4-5, Dr. Ola Spurr.  Marland Kitchen RECTOCELE REPAIR  2011  . ROTATOR CUFF REPAIR  2009    left  . TONSILLECTOMY    . UPPER GASTROINTESTINAL ENDOSCOPY    . VAGINAL HYSTERECTOMY  1973     Current Meds  Medication Sig  . ALPRAZolam (XANAX) 0.5 MG tablet TAKE 1 TABLET BY MOUTH AT BEDTIME AS NEEDED FOR ANXIETY  . amitriptyline (ELAVIL) 25 MG tablet TAKE 1 TABLET (25 MG TOTAL) BY MOUTH AT BEDTIME. FOR MIGRAINES  . aspirin 81 MG tablet Take 81 mg by mouth daily.    . Cholecalciferol (VITAMIN D) 1000 UNITS capsule Take 1,000 Units by mouth daily.    . fluticasone (FLONASE) 50 MCG/ACT nasal spray INHALE 2 SPRAYS INTO EACH NOSTRIL EVERY DAY  . hydrOXYzine (VISTARIL) 25 MG capsule TAKE 1 CAPSULE (25 MG TOTAL) BY MOUTH 3 (THREE) TIMES DAILY AS NEEDED FOR ITCHING.  . MAGNESIUM PO Take 1 tablet by mouth daily.   Marland Kitchen omeprazole (PRILOSEC) 20 MG capsule Take 20 mg by mouth daily.  Marland Kitchen PARoxetine (PAXIL) 20 MG tablet TAKE 1 TABLET BY MOUTH EVERY DAY IN THE MORNING  . polyethylene glycol (MIRALAX / GLYCOLAX) packet Take 17 g by mouth daily.  . RESTASIS 0.05 % ophthalmic emulsion Place 1 drop into both eyes 2 (two) times daily.   . traMADol (ULTRAM) 50 MG tablet TAKE 1 TABLET (50 MG TOTAL) BY MOUTH EVERY 12 (TWELVE) HOURS AS NEEDED.  Marland Kitchen zolpidem (AMBIEN) 5 MG tablet TAKE 1 TABLET (5 MG TOTAL) BY MOUTH AT BEDTIME AS NEEDED. FOR SLEEP     Allergies:   Fluoxetine hcl, Contrast media [iodinated diagnostic agents], Dexamethasone, Livalo [pitavastatin], Metrizamide, Nitroglycerin, Rosuvastatin, Simvastatin, Codeine sulfate, Sulfasalazine, and Sulfonamide derivatives   Social History   Tobacco Use  . Smoking status: Former Smoker    Quit date: 02/07/1993    Years since quitting: 26.3  . Smokeless tobacco: Never Used  Substance Use Topics  . Alcohol use: No  . Drug use: No     Family Hx: The patient's family history includes Alcohol abuse in her brother and father; Crohn's disease in her brother; Depression in her brother; HIV in her son; Heart disease in an other family member; Heart failure in her  daughter; Kidney failure in her mother; Lung cancer in her father and son; Rheum arthritis in her mother. There is no history of Malignant hyperthermia, Colon cancer, Esophageal cancer, Rectal cancer, or Stomach cancer.  ROS:   Please see the history of present illness.     All other systems reviewed and are negative.   Prior CV studies:   The following studies were reviewed today:  Echo 05/28/15: Study Conclusions  - Left ventricle: The cavity size was normal. Wall thickness was  normal. Systolic function was normal. The estimated ejection  fraction was in the range of 55% to 60%. Wall motion was normal;  there were no regional wall motion abnormalities. Doppler  parameters are consistent with abnormal left ventricular  relaxation (grade 1 diastolic dysfunction).  Impressions:  - Normal LV systolic function; grade 1 diastolic dysfunction; trace  MR and TR.  Lexiscan Cardiolite 05/07/15:  Nuclear stress EF: 60%.  The left ventricular ejection fraction is normal (55-65%).  There was no ST segment deviation noted during stress.  No T wave inversion was noted during stress.  Defect 1: There is a small defect of moderate severity.  This is a low risk study.  Small size, moderate intensity fixed septal/inferoseptal perfusion defect which is likely artifact. No reversible ischemia. LVEF 60% with normal wall motion. This is a low risk study.  Labs/Other Tests and Data Reviewed:    EKG:  No ECG reviewed.  Recent Labs: 07/31/2018: ALT 18; BUN 12; Creat 0.82; Potassium 4.3; Sodium 139 12/20/2018: Magnesium 2.0; TSH 3.06   Recent Lipid Panel Lab Results  Component Value Date/Time   CHOL 201 (H) 10/04/2018 10:37 AM   CHOL 174 08/14/2017 08:24 AM   CHOL 244 (H) 03/02/2011 12:45 AM   TRIG 253 (H) 10/04/2018 10:37 AM   TRIG 303 (H) 03/02/2011 12:45 AM   HDL 41 (L) 10/04/2018 10:37 AM   HDL 44 08/14/2017 08:24 AM   HDL 45 03/02/2011 12:45 AM   CHOLHDL 4.9  10/04/2018 10:37 AM   LDLCALC 121 (H) 10/04/2018 10:37 AM   LDLCALC 138 (H) 03/02/2011 12:45 AM    Wt Readings from Last 3 Encounters:  06/06/19 185 lb (83.9 kg)  04/25/19 188 lb (85.3 kg)  03/22/19 182 lb 11.2 oz (82.9 kg)     Objective:    Vital Signs:  BP 121/69   Pulse 86   Ht 5\' 5"  (1.651 m)   Wt 185 lb (83.9 kg)   SpO2 97%   BMI 30.79 kg/m    VITAL SIGNS:  reviewed GEN:  no acute distress EYES:  sclerae anicteric, EOMI - Extraocular Movements Intact RESPIRATORY:  normal respiratory effort, symmetric expansion CARDIOVASCULAR:  no peripheral edema SKIN:  no rash, lesions or ulcers. MUSCULOSKELETAL:  no obvious deformities. NEURO:  alert and oriented x 3, no obvious focal deficit PSYCH:  normal affect  ASSESSMENT & PLAN:    # Shortness of breath: I suspect her shortness of breath is more attributable to physical inactivity during the pandemic and acute pathology.  She is also having a lot of palpitations.  We will get an echo and a Lexiscan Myoview to assess for ischemia and heart failure.  # Hyperlipidemia: She has struggled with several statins.  She is currently on pitavastatin and can only take it once every week.  Her lipids are not at goal.  Her LDL should be less than 70.  We will have her see our pharmacist and consider a PCSK9 inhibitor.  She has a needle aversion and thinks maybe she can get her daughter to do the injections.   # Carotid stenosis:  Mild disease in 2015.  That was stable on 05/2017.  We will repeat carotid Dopplers now.  Lipid control as above.  COVID-19 Education: The signs and symptoms of COVID-19 were discussed with the patient and how to seek care for testing (follow up with PCP or arrange E-visit).  The importance of social distancing was discussed today.  Time:   Today, I have spent 17 minutes with the patient with telehealth technology discussing  the above problems.     Medication Adjustments/Labs and Tests Ordered: Current medicines  are reviewed at length with the patient today.  Concerns regarding medicines are outlined above.   Tests Ordered: No orders of the defined types were placed in this encounter.   Medication Changes: No orders of the defined types were placed in this encounter.   Follow Up:  Either In Person or Virtual in 2 month(s)  Signed, [name], MD  06/06/2019 8:45 AM    [place]

## 2019-06-06 NOTE — Telephone Encounter (Signed)
Enrolled patient for a 3 day Zio monitor to be mailed to patients home.  

## 2019-06-06 NOTE — Patient Instructions (Addendum)
Medication Instructions:  Your physician recommends that you continue on your current medications as directed. Please refer to the Current Medication list given to you today.  *If you need a refill on your cardiac medications before your next appointment, please call your pharmacy*  Lab Work: NONE   Testing/Procedures: Your physician has requested that you have a carotid duplex. This test is an ultrasound of the carotid arteries in your neck. It looks at blood flow through these arteries that supply the brain with blood. Allow one hour for this exam. There are no restrictions or special instructions.  Your physician has requested that you have a lexiscan myoview. For further information please visit [email or web address]. Please follow instruction sheet, as given. [facility] [address]  3 DAY ZIO MONITOR  Follow-Up: At [facility], you and your health needs are our priority.  As part of our continuing mission to provide you with exceptional heart care, we have created designated Provider Care Teams.  These Care Teams include your primary Cardiologist (physician) and Advanced Practice Providers (APPs -  Physician Assistants and Nurse Practitioners) who all work together to provide you with the care you need, when you need it.  We recommend signing up for the patient portal called "MyChart".  Sign up information is provided on this After Visit Summary.  MyChart is used to connect with patients for Virtual Visits (Telemedicine).  Patients are able to view lab/test results, encounter notes, upcoming appointments, etc.  Non-urgent messages can be sent to your provider as well.   To learn more about what you can do with MyChart, go to [email or web address].    Your next appointment:   2 month(s)  The format for your next appointment:   In Person  Provider:   You may see [name], MD or one of the following Advanced Practice Providers on your designated Care  Team:    Kerin Ransom, PA-C  [place], [place]  Coletta Memos, [place]  Your physician recommends that you schedule a follow-up appointment in: [place] [facility]  Other Instructions  ZIO XT- Long Term Monitor Instructions   Your physician has requested you wear your ZIO patch monitor__3__days.   This is a single patch monitor.  [employer] supplies one patch monitor per enrollment.  Additional stickers are not available.   Please do not apply patch if you will be having a Nuclear Stress Test, Echocardiogram, Cardiac CT, MRI, or Chest Xray during the time frame you would be wearing the monitor. The patch cannot be worn during these tests.  You cannot remove and re-apply the ZIO XT patch monitor.   Your ZIO patch monitor will be sent USPS Priority mail from [facility] directly to your home address. The monitor may also be mailed to a PO BOX if home delivery is not available.   It may take 3-5 days to receive your monitor after you have been enrolled.   Once you have received you monitor, please review enclosed instructions.  Your monitor has already been registered assigning a specific monitor serial # to you.   Applying the monitor   Shave hair from upper left chest.   Hold abrader disc by orange tab.  Rub abrader in 40 strokes over left upper chest as indicated in your monitor instructions.   Clean area with 4 enclosed alcohol pads .  Use all pads to assure are is cleaned thoroughly.  Let dry.   Apply patch as indicated in monitor instructions.  Patch will be place under collarbone on left side of chest with arrow pointing upward.   Rub patch adhesive wings for 2 minutes.Remove white label marked "1".  Remove white label marked "2".  Rub patch adhesive wings for 2 additional minutes.   While looking in a mirror, press and release button in center of patch.  A small green light will flash 3-4 times .  This will be your only indicator the monitor has been turned on.     Do  not shower for the first 24 hours.  You may shower after the first 24 hours.   Press button if you feel a symptom. You will hear a small click.  Record Date, Time and Symptom in the Patient Log Book.   When you are ready to remove patch, follow instructions on last 2 pages of Patient Log Book.  Stick patch monitor onto last page of Patient Log Book.   Place Patient Log Book in [place] box.  Use locking tab on box and tape box closed securely.  The [employer] has [employer] on it.  Please place in mailbox as soon as possible.  Your physician should have your test results approximately 7 days after the monitor has been mailed back to [facility].   Call [place] at [phone] if you have questions regarding your ZIO XT patch monitor.  Call them immediately if you see an orange light blinking on your monitor.   If your monitor falls off in less than 4 days contact our Monitor department at [phone].  If your monitor becomes loose or falls off after 4 days call [employer] at [phone] for suggestions on securing your monitor.

## 2019-06-10 ENCOUNTER — Ambulatory Visit (HOSPITAL_COMMUNITY)
Admission: RE | Admit: 2019-06-10 | Discharge: 2019-06-10 | Disposition: A | Discharge status: Home / Self Care | Payer: PPO | Source: Ambulatory Visit | Attending: Cardiology | Admitting: Cardiology

## 2019-06-10 ENCOUNTER — Telehealth (HOSPITAL_COMMUNITY): Payer: Self-pay | Admitting: *Deleted

## 2019-06-10 ENCOUNTER — Other Ambulatory Visit: Payer: Self-pay

## 2019-06-10 DIAGNOSIS — I6523 Occlusion and stenosis of bilateral carotid arteries: Secondary | ICD-10-CM | POA: Diagnosis not present

## 2019-06-10 DIAGNOSIS — E7800 Pure hypercholesterolemia, unspecified: Secondary | ICD-10-CM | POA: Diagnosis not present

## 2019-06-10 NOTE — Telephone Encounter (Signed)
Left message on voicemail per DPR in reference to upcoming appointment scheduled on 06/14/19 at 10:15 with detailed instructions given per Myocardial Perfusion Study Information Sheet for the test. LM to arrive 15 minutes early, and that it is imperative to arrive on time for appointment to keep from having the test rescheduled. If you need to cancel or reschedule your appointment, please call the office within 24 hours of your appointment. Failure to do so may result in a cancellation of your appointment, and a $50 no show fee. Phone number given for call back for any questions.

## 2019-06-11 ENCOUNTER — Other Ambulatory Visit (INDEPENDENT_AMBULATORY_CARE_PROVIDER_SITE_OTHER): Payer: PPO

## 2019-06-11 DIAGNOSIS — R0600 Dyspnea, unspecified: Secondary | ICD-10-CM

## 2019-06-11 DIAGNOSIS — R002 Palpitations: Secondary | ICD-10-CM

## 2019-06-11 DIAGNOSIS — R0609 Other forms of dyspnea: Secondary | ICD-10-CM

## 2019-06-14 ENCOUNTER — Ambulatory Visit (HOSPITAL_COMMUNITY): Payer: PPO | Attending: Internal Medicine

## 2019-06-14 ENCOUNTER — Other Ambulatory Visit: Payer: Self-pay

## 2019-06-14 VITALS — Ht 65.0 in | Wt 185.0 lb

## 2019-06-14 DIAGNOSIS — R0600 Dyspnea, unspecified: Secondary | ICD-10-CM

## 2019-06-14 DIAGNOSIS — R002 Palpitations: Secondary | ICD-10-CM | POA: Insufficient documentation

## 2019-06-14 DIAGNOSIS — I10 Essential (primary) hypertension: Secondary | ICD-10-CM | POA: Diagnosis not present

## 2019-06-14 DIAGNOSIS — E7800 Pure hypercholesterolemia, unspecified: Secondary | ICD-10-CM | POA: Diagnosis not present

## 2019-06-14 DIAGNOSIS — R0609 Other forms of dyspnea: Secondary | ICD-10-CM

## 2019-06-14 DIAGNOSIS — I6523 Occlusion and stenosis of bilateral carotid arteries: Secondary | ICD-10-CM | POA: Diagnosis not present

## 2019-06-14 LAB — MYOCARDIAL PERFUSION IMAGING
LV dias vol: 46 mL (ref 46–106)
LV sys vol: 14 mL
Peak HR: 97 {beats}/min
Rest HR: 85 {beats}/min
SDS: 0
SRS: 0
SSS: 0
TID: 0.93

## 2019-06-14 MED ORDER — TECHNETIUM TC 99M TETROFOSMIN IV KIT
10.1000 | PACK | Freq: Once | INTRAVENOUS | Status: AC | PRN
  Administered 2019-06-14: 10.1 via INTRAVENOUS
  Filled 2019-06-14: qty 11

## 2019-06-14 MED ORDER — REGADENOSON 0.4 MG/5ML IV SOLN
0.4000 mg | Freq: Once | INTRAVENOUS | Status: AC
  Administered 2019-06-14: 0.4 mg via INTRAVENOUS

## 2019-06-14 MED ORDER — TECHNETIUM TC 99M TETROFOSMIN IV KIT
32.5000 | PACK | Freq: Once | INTRAVENOUS | Status: AC | PRN
  Administered 2019-06-14: 32.5 via INTRAVENOUS
  Filled 2019-06-14: qty 33

## 2019-06-16 ENCOUNTER — Encounter: Payer: Self-pay | Admitting: Family Medicine

## 2019-06-18 ENCOUNTER — Other Ambulatory Visit: Payer: Self-pay

## 2019-06-18 ENCOUNTER — Ambulatory Visit (INDEPENDENT_AMBULATORY_CARE_PROVIDER_SITE_OTHER): Payer: PPO | Admitting: Family Medicine

## 2019-06-18 ENCOUNTER — Encounter: Payer: Self-pay | Admitting: Family Medicine

## 2019-06-18 VITALS — BP 119/56 | HR 64 | Ht 65.0 in | Wt 189.0 lb

## 2019-06-18 DIAGNOSIS — B372 Candidiasis of skin and nail: Secondary | ICD-10-CM | POA: Diagnosis not present

## 2019-06-18 MED ORDER — FLUCONAZOLE 150 MG PO TABS: 150.0000 mg | ORAL_TABLET | ORAL | 0 refills | Status: DC

## 2019-06-18 MED ORDER — NYSTATIN 100000 UNIT/GM EX POWD: 1.0000 "application " | Freq: Two times a day (BID) | CUTANEOUS | 2 refills | Status: DC | PRN

## 2019-06-18 MED ORDER — BENZOYL PEROXIDE WASH 5 % EX LIQD: Freq: Two times a day (BID) | CUTANEOUS | 12 refills | Status: DC

## 2019-06-18 NOTE — Progress Notes (Signed)
She reports that Dr. Sheppard Coil Dx this 2-3 years ago. At that time she only needed to use powder.   She stated that over the weekend the area under her breast and abdomen got worse. She tried using Neosporin and this made the area worse.

## 2019-06-18 NOTE — Progress Notes (Signed)
Acute Office Visit  Subjective:    Patient ID: Cheryl [name], female    DOB: 1940-04-21, 79 y.o.   MRN: [id number]  Chief Complaint  Patient presents with  . Intertrigo    under breasts    HPI Patient is in today for She reports that Dr. Sheppard Coil Dx this 2-3 years ago. At that time she only needed to use powder.  She says it is a foot fungus powder.  It does not seem to be helping lately.  She stated that over the weekend the area under her breast and abdomen got worse. She tried using Neosporin and this made the area worse.  She says that starting to burn and get really uncomfortable at this point.  Past Medical History:  Diagnosis Date  . Anemia   . Anxiety    d/t recent family issues  . Arthritis   . Bone spur   . Carotid stenosis, bilateral 06/06/2019  . Chronic back pain    stenosis and OA  . Depression    takes Paxil daily  . Diverticulosis   . Dizzy    occasionally  . Fatty liver    has had 2 Hep shots and the final one is in June 18  . GERD (gastroesophageal reflux disease)    takes Omeprazole daily  . Hepatitis 2012  . History of bronchitis    20-25 yrs ago  . History of colon polyps    precancerous  . History of shingles   . Hyperlipidemia    takes Simvastatin daily  . IBS (irritable bowel syndrome)    more on side of constipation-takes Miralax daily  . Insomnia    takes Melatonin and Ambien nightly  . Internal hemorrhoids   . Joint pain   . Joint swelling   . Lung nodules    calcified   . Migraine headache    migraines-thinks coming from neck.Last one1/28/18  . Pneumonia    hx of-20 yrs ago-=-walking  . Renal cyst    left  . Uterine cancer ([place]) 1973    Past Surgical History:  Procedure Laterality Date  . APPENDECTOMY  1966  . cataracts     bilateral  . COLONOSCOPY    . EUS  02/10/2011   Procedure: UPPER ENDOSCOPIC ULTRASOUND (EUS) RADIAL;  Surgeon: Owens Loffler, MD;  Location: [facility];  Service: Endoscopy;  Laterality: N/A;   radial linear   . LUMBAR FUSION  03/2016   L4-5, Dr. Ola Spurr.  Marland Kitchen RECTOCELE REPAIR  2011  . ROTATOR CUFF REPAIR  2009   left  . TONSILLECTOMY    . UPPER GASTROINTESTINAL ENDOSCOPY    . VAGINAL HYSTERECTOMY  1973    Family History  Problem Relation Age of Onset  . Alcohol abuse Father   . Lung cancer Father        smoker  . Kidney failure Mother        med induced  . Rheum arthritis Mother   . Depression Brother   . Heart disease Other        Paternal family   . Heart failure Daughter   . Lung cancer Son   . Crohn's disease Brother   . Alcohol abuse Brother   . HIV Son   . Malignant hyperthermia Neg Hx   . Colon cancer Neg Hx   . Esophageal cancer Neg Hx   . Rectal cancer Neg Hx   . Stomach cancer Neg Hx     Social History  Socioeconomic History  . Marital status: Legally Separated    Spouse name: Not on file  . Number of children: 4  . Years of education: Not on file  . Highest education level: Not on file  Occupational History  . Occupation: Retired      Fish farm manager: RETIRED  Tobacco Use  . Smoking status: Former Smoker    Quit date: 02/07/1993    Years since quitting: 26.3  . Smokeless tobacco: Never Used  Substance and Sexual Activity  . Alcohol use: No  . Drug use: No  . Sexual activity: Not on file  Other Topics Concern  . Not on file  Social History Narrative   Walking daily.              Epworth Sleepiness Scale = 1 (as of 04/10/15)   Social Determinants of Health   Financial Resource Strain:   . Difficulty of Paying Living Expenses:   Food Insecurity:   . Worried About Charity fundraiser in the Last Year:   . Arboriculturist in the Last Year:   Transportation Needs:   . Film/video editor (Medical):   Marland Kitchen Lack of Transportation (Non-Medical):   Physical Activity:   . Days of Exercise per Week:   . Minutes of Exercise per Session:   Stress:   . Feeling of Stress :   Social Connections:   . Frequency of Communication with Friends and  Family:   . Frequency of Social Gatherings with Friends and Family:   . Attends Religious Services:   . Active Member of Clubs or Organizations:   . Attends Archivist Meetings:   Marland Kitchen Marital Status:   Intimate Partner Violence:   . Fear of Current or Ex-Partner:   . Emotionally Abused:   Marland Kitchen Physically Abused:   . Sexually Abused:     Outpatient Medications Prior to Visit  Medication Sig Dispense Refill  . ALPRAZolam (XANAX) 0.5 MG tablet TAKE 1 TABLET BY MOUTH AT BEDTIME AS NEEDED FOR ANXIETY 15 tablet 0  . aspirin 81 MG tablet Take 81 mg by mouth daily.      . Cholecalciferol (VITAMIN D) 1000 UNITS capsule Take 1,000 Units by mouth daily.      . fluticasone (FLONASE) 50 MCG/ACT nasal spray INHALE 2 SPRAYS INTO EACH NOSTRIL EVERY DAY 48 mL 1  . hydrOXYzine (VISTARIL) 25 MG capsule TAKE 1 CAPSULE (25 MG TOTAL) BY MOUTH 3 (THREE) TIMES DAILY AS NEEDED FOR ITCHING. 270 capsule 1  . MAGNESIUM PO Take 1 tablet by mouth daily.     Marland Kitchen omeprazole (PRILOSEC) 20 MG capsule Take 20 mg by mouth daily.    Marland Kitchen PARoxetine (PAXIL) 20 MG tablet TAKE 1 TABLET BY MOUTH EVERY DAY IN THE MORNING 90 tablet 1  . polyethylene glycol (MIRALAX / GLYCOLAX) packet Take 17 g by mouth daily.    . RESTASIS 0.05 % ophthalmic emulsion Place 1 drop into both eyes 2 (two) times daily.     . traMADol (ULTRAM) 50 MG tablet TAKE 1 TABLET (50 MG TOTAL) BY MOUTH EVERY 12 (TWELVE) HOURS AS NEEDED. 60 tablet 0  . zolpidem (AMBIEN) 5 MG tablet TAKE 1 TABLET (5 MG TOTAL) BY MOUTH AT BEDTIME AS NEEDED. FOR SLEEP 30 tablet 1  . amitriptyline (ELAVIL) 25 MG tablet TAKE 1 TABLET (25 MG TOTAL) BY MOUTH AT BEDTIME. FOR MIGRAINES 90 tablet 1   No facility-administered medications prior to visit.    Allergies  Allergen Reactions  .  Fluoxetine Hcl Shortness Of Breath and Itching    REACTION: itching,SOB  . Contrast Media [Iodinated Diagnostic Agents] Itching    Multilance.   . Dexamethasone Diarrhea and Nausea And Vomiting   . Livalo [Pitavastatin] Other (See Comments)    Myalgia   . Metrizamide Itching    Multilance.   . Nitroglycerin     Nausea,dizziness,sweats  . Rosuvastatin Other (See Comments)    bodyaches and cramping  . Simvastatin Other (See Comments)    Muscle aches and HA  . Codeine Sulfate Itching    REACTION: itching  . Sulfasalazine Nausea Only    REACTION: Nausea  . Sulfonamide Derivatives Nausea Only    REACTION: Nausea    Review of Systems     Objective:    Physical Exam Vitals reviewed.  Constitutional:      Appearance: She is well-developed.  HENT:     Head: Normocephalic and atraumatic.  Eyes:     Conjunctiva/sclera: Conjunctivae normal.  Cardiovascular:     Rate and Rhythm: Normal rate.  Pulmonary:     Effort: Pulmonary effort is normal.  Skin:    General: Skin is dry.     Coloration: Skin is not pale.     Comments: Erythematous maculopapular rash underneath both breasts.  Much more severe along the pannus crease there is actually some raw areas.  There are some scattered satellite lesions around the edge of the rash.  Neurological:     Mental Status: She is alert and oriented to person, place, and time.  Psychiatric:        Behavior: Behavior normal.     BP (!) 119/56   Pulse 64   Ht 5\' 5"  (1.651 m)   Wt 189 lb (85.7 kg)   SpO2 98%   BMI 31.45 kg/m  Wt Readings from Last 3 Encounters:  06/18/19 189 lb (85.7 kg)  06/14/19 185 lb (83.9 kg)  06/06/19 185 lb (83.9 kg)    There are no preventive care reminders to display for this patient.  There are no preventive care reminders to display for this patient.   Lab Results  Component Value Date   TSH 3.06 12/20/2018   Lab Results  Component Value Date   WBC 7.1 09/26/2017   HGB 15.3 09/26/2017   HCT 46.5 (H) 09/26/2017   MCV 84.1 09/26/2017   PLT 279 09/26/2017   Lab Results  Component Value Date   NA 139 07/31/2018   K 4.3 07/31/2018   CO2 26 07/31/2018   GLUCOSE 103 (H) 07/31/2018   BUN  12 07/31/2018   CREATININE 0.82 07/31/2018   BILITOT 0.4 07/31/2018   ALKPHOS 98 08/14/2017   AST 24 07/31/2018   ALT 18 07/31/2018   PROT 6.5 07/31/2018   ALBUMIN 4.1 08/14/2017   CALCIUM 9.6 07/31/2018   ANIONGAP 9 03/07/2016   Lab Results  Component Value Date   CHOL 201 (H) 10/04/2018   Lab Results  Component Value Date   HDL 41 (L) 10/04/2018   Lab Results  Component Value Date   LDLCALC 121 (H) 10/04/2018   Lab Results  Component Value Date   TRIG 253 (H) 10/04/2018   Lab Results  Component Value Date   CHOLHDL 4.9 10/04/2018   Lab Results  Component Value Date   HGBA1C 5.8 12/17/2013       Assessment & Plan:   Problem List Items Addressed This Visit    None    Visit Diagnoses    Skin candidiasis    -  Primary   Relevant Medications   fluconazole (DIFLUCAN) 150 MG tablet   benzoyl peroxide (BENZOYL PEROXIDE) 5 % external liquid   nystatin (MYCOSTATIN/NYSTOP) powder     Skin candidiasis-erythema and rash underneath both breasts and along the pannus crease most consistent with candidiasis.  Also consider erythrasma.  We will treat with oral Diflucan 1 tab every other day for 3 days as well as topical nystatin powder.  Prescription sent to pharmacy.  Also can have her use a benzyl peroxide wash to clean the area.  If rash is not significantly improved and or resolving within 10 days then please give [name] call back.  Meds ordered this encounter  Medications  . fluconazole (DIFLUCAN) 150 MG tablet    Sig: Take 1 tablet (150 mg total) by mouth every other day.    Dispense:  3 tablet    Refill:  0  . benzoyl peroxide (BENZOYL PEROXIDE) 5 % external liquid    Sig: Apply topically 2 (two) times daily.    Dispense:  142 g    Refill:  12  . nystatin (MYCOSTATIN/NYSTOP) powder    Sig: Apply 1 application topically 2 (two) times daily as needed.    Dispense:  60 g    Refill:  2     [name], MD

## 2019-06-28 DIAGNOSIS — R002 Palpitations: Secondary | ICD-10-CM | POA: Diagnosis not present

## 2019-07-02 NOTE — Progress Notes (Signed)
[facility]    Provider:  Dr Jaynee Eagles Requesting Provider: [name] [facility] Primary Care Provider:  Hali Marry, MD  CC:  Migraines  HPI:  Cheryl [name] is a 79 y.o. female here as requested by [name] [facility] for migraines. I reviewed [name] PAC's notes: Patient was being seen by [facility] neurosurgery and spine for cervicogenic headache, neck pain, she was last seen September 13, 2019 for bilateral C3 C2-C5 medial branch block.  She has chronic cervical pain with cervicogenic headache as well as chronic lumbar pain previously treated with tramadol, she is tried various medications for headache management such as nortriptyline and amitriptyline, amitriptyline was initially effective but then stopped, when she was seen for follow-up April 20 she had excellent response to the medial branch block and 80 to 85% improvement in her neck pain, little pain in the neck itself unfortunately she continued to describe headache daily, headache symptoms are very limiting, making it difficult for her to be active as well as difficult for her to sleep, no new symptoms or falls.  I was also able to review Dr. Fay Records notes pertaining to her headaches: Patient does have a history of migraines, she felt like they are coming from her neck, a lot of tension and tightness in her neck and a decrease in rotation to the left, heat in the morning helps, inevitably during the day her headache starts in the last until she goes to bed, some days it is more severe than others, she tried needling which caused more pain, she is tried over-the-counter medications for the neck pain as well, headaches worsening since quarantine over a year, no visual changes, she was told she could not use medications for her migraines due to "white spots" on her brain, headaches can move around and can be on the top of the head and the side of the head or the back of the head, sometimes behind the eyes, usually more on one  side.  She is tried amitriptyline as well as Topamax and multiple other medications.  She has been diagnosed with migraines for about 40 years, they slowly progressed and now they are debilitating, she can feel it coming in the back or the sides of her head most of the time in the temples, can be anytime, in the temples, she tried bote guards, it is throbbing, phono/photophobia, nausea, movement makes it worse, no vomiting, can last for up to a week off and on but one headache can last 24 hours. Daily headaches, 15 migraine days a month that are moderately severe to severe. No aura, no weakness, no numbness or tingling or weakness, heat may help a little on the neck otherwise nothing over the counter really helps she takes excedrin migraine, no known family history, no head trauma or inciting events, encephalitis as a child. Ongoing at thie frequency and severity and quality for years.   Reviewed notes, labs and imaging from outside physicians, which showed:   MRI brain 05/13/2017: IMPRESSION: No acute abnormality. Progressive atrophy and chronic white matter changes since 2012.  Amitriptyline, gabapentin, propranolol, paxil, tramadol, trazodone  Review of Systems: Patient complains of symptoms per HPI as well as the following symptoms: headache. Pertinent negatives and positives per HPI. All others negative.   Social History   Socioeconomic History  . Marital status: Divorced    Spouse name: Not on file  . Number of children: 4  . Years of education: Not on file  . Highest education  level: Not on file  Occupational History  . Occupation: Retired      Fish farm manager: RETIRED  Tobacco Use  . Smoking status: Former Smoker    Quit date: 02/07/1993    Years since quitting: 26.4  . Smokeless tobacco: Never Used  Substance and Sexual Activity  . Alcohol use: No  . Drug use: No  . Sexual activity: Not on file  Other Topics Concern  . Not on file  Social History Narrative   Walking daily.               Epworth Sleepiness Scale = 1 (as of 04/10/15)      07/03/2019   Lives at home alone with her dog   Right handed    Caffeine: tea occasional   Social Determinants of Health   Financial Resource Strain:   . Difficulty of Paying Living Expenses:   Food Insecurity:   . Worried About Charity fundraiser in the Last Year:   . Arboriculturist in the Last Year:   Transportation Needs:   . Film/video editor (Medical):   Marland Kitchen Lack of Transportation (Non-Medical):   Physical Activity:   . Days of Exercise per Week:   . Minutes of Exercise per Session:   Stress:   . Feeling of Stress :   Social Connections:   . Frequency of Communication with Friends and Family:   . Frequency of Social Gatherings with Friends and Family:   . Attends Religious Services:   . Active Member of Clubs or Organizations:   . Attends Archivist Meetings:   Marland Kitchen Marital Status:   Intimate Partner Violence:   . Fear of Current or Ex-Partner:   . Emotionally Abused:   Marland Kitchen Physically Abused:   . Sexually Abused:     Family History  Problem Relation Age of Onset  . Alcohol abuse Father   . Lung cancer Father        smoker  . Kidney failure Mother        med induced  . Rheum arthritis Mother   . Depression Brother   . Heart disease Other        Paternal family   . Heart failure Daughter   . Lung cancer Son   . Crohn's disease Brother   . Alcohol abuse Brother   . HIV Son   . Malignant hyperthermia Neg Hx   . Colon cancer Neg Hx   . Esophageal cancer Neg Hx   . Rectal cancer Neg Hx   . Stomach cancer Neg Hx   . Migraines Neg Hx     Past Medical History:  Diagnosis Date  . Anemia   . Anxiety    d/t recent family issues  . Arthritis   . Bone spur   . Carotid stenosis, bilateral 06/06/2019  . Chronic back pain    stenosis and OA  . Depression    takes Paxil daily  . Diverticulosis   . Dizzy    occasionally  . Encephalitis    at 9 months ago   . Fatty liver    has had 2 Hep  shots and the final one is in June 18  . GERD (gastroesophageal reflux disease)    takes Omeprazole daily  . Hepatitis 2012  . History of bronchitis    20-25 yrs ago  . History of colon polyps    precancerous  . History of shingles   . Hyperlipidemia    takes  Simvastatin daily  . IBS (irritable bowel syndrome)    more on side of constipation-takes Miralax daily  . Insomnia    takes Melatonin and Ambien nightly  . Internal hemorrhoids   . Joint pain   . Joint swelling   . Lung nodules    calcified   . Migraine headache    migraines-thinks coming from neck.Last one1/28/18  . Pneumonia    hx of-20 yrs ago-=-walking  . Renal cyst    left  . Uterine cancer [facility]) 1973    Patient Active Problem List   Diagnosis Date Noted  . Carotid stenosis, bilateral 06/06/2019  . Migraine headache 03/22/2019  . Daily headache 03/22/2019  . Myofascial pain 03/27/2018  . Numbness of left foot 11/07/2017  . Right foot pain 09/12/2017  . Deviated septum 01/25/2017  . ETD (Eustachian tube dysfunction), bilateral 01/25/2017  . Seasonal allergic rhinitis 01/25/2017  . Bilateral hearing loss 01/25/2017  . S/P lumbar fusion 06/20/2016  . 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 Fatty liver 12/22/2015  . Onychodystrophy 11/24/2015  . Prepatellar bursitis of right knee 08/18/2014  . Hyperlipidemia 12/17/2013  . Osteoarthritis of left knee 07/05/2013  . Obesity 03/10/2011  . Dyspepsia 02/10/2011  . Pulmonary nodule 12/10/2010  . PALPITATIONS 02/11/2010  . OSTEOPENIA 11/27/2009  . CONSTIPATION, CHRONIC 08/15/2008  . NECK PAIN 04/11/2008  . Disorder of kidney and ureter 01/21/2008  . HEMORRHOIDS, INTERNAL 01/16/2008  . DIVERTICULOSIS, COLON 01/16/2008  . POSTMENOPAUSAL STATUS 12/05/2007  . MICROSCOPIC HEMATURIA 06/05/2007  . STENOSIS, LUMBAR SPINE 12/22/2005  . ANXIETY 11/15/2005  . IRRITABLE BOWEL SYNDROME 11/15/2005  . ARTHRITIS 11/15/2005    Past Surgical History:    Procedure Laterality Date  . APPENDECTOMY  1966  . cataracts     bilateral  . COLONOSCOPY    . EUS  02/10/2011   Procedure: UPPER ENDOSCOPIC ULTRASOUND (EUS) RADIAL;  Surgeon: Owens Loffler, MD;  Location: [facility];  Service: Endoscopy;  Laterality: N/A;  radial linear   . LUMBAR FUSION  03/2016   L4-5, Dr. Ola Spurr.  Marland Kitchen RECTOCELE REPAIR  2011  . ROTATOR CUFF REPAIR  2009   left  . TONSILLECTOMY    . UPPER GASTROINTESTINAL ENDOSCOPY    . VAGINAL HYSTERECTOMY  1973    Current Outpatient Medications  Medication Sig Dispense Refill  . ALPRAZolam (XANAX) 0.5 MG tablet TAKE 1 TABLET BY MOUTH AT BEDTIME AS NEEDED FOR ANXIETY 15 tablet 0  . aspirin 81 MG tablet Take 81 mg by mouth daily.      . benzoyl peroxide (BENZOYL PEROXIDE) 5 % external liquid Apply topically 2 (two) times daily. 142 g 12  . Cholecalciferol (VITAMIN D) 1000 UNITS capsule Take 1,000 Units by mouth daily.      . fluconazole (DIFLUCAN) 150 MG tablet Take 1 tablet (150 mg total) by mouth every other day. 3 tablet 0  . fluticasone (FLONASE) 50 MCG/ACT nasal spray INHALE 2 SPRAYS INTO EACH NOSTRIL EVERY DAY 48 mL 1  . hydrOXYzine (VISTARIL) 25 MG capsule TAKE 1 CAPSULE (25 MG TOTAL) BY MOUTH 3 (THREE) TIMES DAILY AS NEEDED FOR ITCHING. 270 capsule 1  . MAGNESIUM PO Take 1 tablet by mouth daily.     Marland Kitchen nystatin (MYCOSTATIN/NYSTOP) powder Apply 1 application topically 2 (two) times daily as needed. 60 g 2  . omeprazole (PRILOSEC) 20 MG capsule Take 20 mg by mouth daily.    Marland Kitchen PARoxetine (PAXIL) 20 MG tablet TAKE 1 TABLET BY MOUTH EVERY DAY IN  THE MORNING 90 tablet 1  . polyethylene glycol (MIRALAX / GLYCOLAX) packet Take 17 g by mouth daily.    . RESTASIS 0.05 % ophthalmic emulsion Place 1 drop into both eyes 2 (two) times daily.     . traMADol (ULTRAM) 50 MG tablet TAKE 1 TABLET (50 MG TOTAL) BY MOUTH EVERY 12 (TWELVE) HOURS AS NEEDED. 60 tablet 0  . zolpidem (AMBIEN) 5 MG tablet TAKE 1 TABLET (5 MG TOTAL) BY MOUTH AT  BEDTIME AS NEEDED. FOR SLEEP 30 tablet 1  . Fremanezumab-vfrm (AJOVY) 225 MG/1.5ML SOAJ Inject 225 mg into the skin every 30 (thirty) days. 1 pen 11   No current facility-administered medications for this visit.    Allergies as of 07/03/2019 - Review Complete 07/03/2019  Allergen Reaction Noted  . Fluoxetine hcl Shortness Of Breath and Itching 12/20/2005  . Contrast media [iodinated diagnostic agents] Itching 08/01/2011  . Dexamethasone Diarrhea and Nausea And Vomiting 04/07/2016  . Livalo [pitavastatin] Other (See Comments) 02/08/2017  . Metrizamide Itching 08/01/2011  . Nitroglycerin  09/26/2017  . Rosuvastatin Other (See Comments) 01/20/2017  . Simvastatin Other (See Comments) 12/22/2016  . Codeine sulfate Itching 12/20/2005  . Sulfasalazine Nausea Only 12/20/2005  . Sulfonamide derivatives Nausea Only 12/20/2005    Vitals: BP 126/71 (BP Location: Left Arm, Patient Position: Sitting)   Pulse 98   Wt 189 lb (85.7 kg)   BMI 31.45 kg/m  Last Weight:  Wt Readings from Last 1 Encounters:  07/03/19 189 lb (85.7 kg)   Last Height:   Ht Readings from Last 1 Encounters:  06/18/19 5\' 5"  (1.651 m)     Physical exam: Exam: Gen: NAD, conversant, well nourised, obese, well groomed                     CV: RRR, no MRG. No Carotid Bruits. No peripheral edema, warm, nontender Eyes: Conjunctivae clear without exudates or hemorrhage  Neuro: Detailed Neurologic Exam  Speech:    Speech is normal; fluent and spontaneous with normal comprehension.  Cognition:    The patient is oriented to person, place, and time;     recent and remote memory intact;     language fluent;     normal attention, concentration,     fund of knowledge Cranial Nerves:    The pupils are equal, round, and reactive to light. The fundi are flat. Visual fields are full to finger confrontation. Extraocular movements are intact. Trigeminal sensation is intact and the muscles of mastication are normal. The face is  symmetric. The palate elevates in the midline. Hearing intact. Voice is normal. Shoulder shrug is normal. The tongue has normal motion without fasciculations.   Coordination:    No dysmetria or ataxia   Gait:    Normal native gait  Motor Observation:    No asymmetry, no atrophy, and no involuntary movements noted. Tone:    Normal muscle tone.    Posture:    Posture is normal. normal erect    Strength:    Strength is V/V in the upper and lower limbs.      Sensation: intact to LT     Reflex Exam:  DTR's:    Deep tendon reflexes in the upper and lower extremities are symmetrical bilaterally.   Toes:    The toes are equiv bilaterally.   Clonus:    Clonus is absent.    Assessment/Plan:  82 with chronic intractable migraines for decades.   - Aimovig contraindicated due to her  IBS, start Ajovy if insurance does not cover will try to get foundation help otherwise can switch to [employer] - Discussed botox as well, will see how much she may pay for botox. Botox will help with her neck pain, and her clenching we could inject in the masseters and in the temples where the muscles contract when she clenches. - At next appointment discuss acute management, hesitate to give triptans due white matter changes but nurtec or ubrelvy or other may help.    Meds ordered this encounter  Medications  . Fremanezumab-vfrm (AJOVY) 225 MG/1.5ML SOAJ    Sig: Inject 225 mg into the skin every 30 (thirty) days.    Dispense:  1 pen    Refill:  11    Cc: Covington Ardis Rowan*,  [name] [facility]  Sarina Ill, MD  [facility] [address] [place], [place] [address]  Phone [phone] Fax [phone]  I spent 60 minutes of face-to-face and non-face-to-face time with patient on the  1. Chronic migraine without aura, with intractable migraine, so stated, with status migrainosus    diagnosis.  This included previsit chart review, lab review, study review,  order entry, electronic health record documentation, patient education on the different diagnostic and therapeutic options, counseling and coordination of care, risks and benefits of management, compliance, or risk factor reduction

## 2019-07-03 ENCOUNTER — Other Ambulatory Visit: Payer: Self-pay | Admitting: Family Medicine

## 2019-07-03 ENCOUNTER — Telehealth: Payer: Self-pay | Admitting: Neurology

## 2019-07-03 ENCOUNTER — Ambulatory Visit: Payer: PPO | Admitting: Neurology

## 2019-07-03 ENCOUNTER — Encounter: Payer: Self-pay | Admitting: Neurology

## 2019-07-03 ENCOUNTER — Other Ambulatory Visit: Payer: Self-pay

## 2019-07-03 VITALS — BP 126/71 | HR 98 | Wt 189.0 lb

## 2019-07-03 DIAGNOSIS — G43711 Chronic migraine without aura, intractable, with status migrainosus: Secondary | ICD-10-CM | POA: Diagnosis not present

## 2019-07-03 DIAGNOSIS — F419 Anxiety disorder, unspecified: Secondary | ICD-10-CM

## 2019-07-03 MED ORDER — AJOVY 225 MG/1.5ML ~~LOC~~ SOAJ: 225.0000 mg | SUBCUTANEOUS | 11 refills | Status: DC

## 2019-07-03 NOTE — Patient Instructions (Addendum)
Start AJOVY monthly. Fill out the paperwork for the foundation. If cannot get it approves switch to [employer].  Consider also starting botox for migraines  Fremanezumab injection What is this medicine? FREMANEZUMAB (fre ma NEZ ue mab) is used to prevent migraine headaches. This medicine may be used for other purposes; ask your health care provider or pharmacist if you have questions. COMMON BRAND NAME(S): AJOVY What should I tell my health care provider before I take this medicine? They need to know if you have any of these conditions:  an unusual or allergic reaction to fremanezumab, other medicines, foods, dyes, or preservatives  pregnant or trying to get pregnant  breast-feeding How should I use this medicine? This medicine is for injection under the skin. You will be taught how to prepare and give this medicine. Use exactly as directed. Take your medicine at regular intervals. Do not take your medicine more often than directed. It is important that you put your used needles and syringes in a special sharps container. Do not put them in a trash can. If you do not have a sharps container, call your pharmacist or healthcare provider to get one. Talk to your pediatrician regarding the use of this medicine in children. Special care may be needed. Overdosage: If you think you have taken too much of this medicine contact a poison control center or emergency room at once. NOTE: This medicine is only for you. Do not share this medicine with others. What if I miss a dose? If you miss a dose, take it as soon as you can. If it is almost time for your next dose, take only that dose. Do not take double or extra doses. What may interact with this medicine? Interactions are not expected. This list may not describe all possible interactions. Give your health care provider a list of all the medicines, herbs, non-prescription drugs, or dietary supplements you use. Also tell them if you smoke, drink  alcohol, or use illegal drugs. Some items may interact with your medicine. What should I watch for while using this medicine? Tell your doctor or healthcare professional if your symptoms do not start to get better or if they get worse. What side effects may I notice from receiving this medicine? Side effects that you should report to your doctor or health care professional as soon as possible:  allergic reactions like skin rash, itching or hives, swelling of the face, lips, or tongue Side effects that usually do not require medical attention (report these to your doctor or health care professional if they continue or are bothersome):  pain, redness, or irritation at site where injected This list may not describe all possible side effects. Call your doctor for medical advice about side effects. You may report side effects to FDA at 1-800-FDA-1088. Where should I keep my medicine? Keep out of the reach of children. You will be instructed on how to store this medicine. Throw away any unused medicine after the expiration date on the label. NOTE: This sheet is a summary. It may not cover all possible information. If you have questions about this medicine, talk to your doctor, pharmacist, or health care provider.  2020 Elsevier/Gold Standard (2016-10-24 17:22:56) OnabotulinumtoxinA injection (Medical Use) What is this medicine? ONABOTULINUMTOXINA (o na BOTT you lye num tox in eh) is a neuro-muscular blocker. This medicine is used to treat crossed eyes, eyelid spasms, severe neck muscle spasms, ankle and toe muscle spasms, and elbow, wrist, and finger muscle spasms. It is also  used to treat excessive underarm sweating, to prevent chronic migraine headaches, and to treat loss of bladder control due to neurologic conditions such as multiple sclerosis or spinal cord injury. This medicine may be used for other purposes; ask your health care provider or pharmacist if you have questions. COMMON BRAND  NAME(S): Botox What should I tell my health care provider before I take this medicine? They need to know if you have any of these conditions:  breathing problems  cerebral palsy spasms  difficulty urinating  heart problems  history of surgery where this medicine is going to be used  infection at the site where this medicine is going to be used  myasthenia gravis or other neurologic disease  nerve or muscle disease  surgery plans  take medicines that treat or prevent blood clots  thyroid problems  an unusual or allergic reaction to botulinum toxin, albumin, other medicines, foods, dyes, or preservatives  pregnant or trying to get pregnant  breast-feeding How should I use this medicine? This medicine is for injection into a muscle. It is given by a health care professional in a hospital or clinic setting. Talk to your pediatrician regarding the use of this medicine in children. While this drug may be prescribed for children as young as 28 years old for selected conditions, precautions do apply. Overdosage: If you think you have taken too much of this medicine contact a poison control center or emergency room at once. NOTE: This medicine is only for you. Do not share this medicine with others. What if I miss a dose? This does not apply. What may interact with this medicine?  aminoglycoside antibiotics like gentamicin, neomycin, tobramycin  muscle relaxants  other botulinum toxin injections This list may not describe all possible interactions. Give your health care provider a list of all the medicines, herbs, non-prescription drugs, or dietary supplements you use. Also tell them if you smoke, drink alcohol, or use illegal drugs. Some items may interact with your medicine. What should I watch for while using this medicine? Visit your doctor for regular check ups. This medicine will cause weakness in the muscle where it is injected. Tell your doctor if you feel unusually weak  in other muscles. Get medical help right away if you have problems with breathing, swallowing, or talking. This medicine might make your eyelids droop or make you see blurry or double. If you have weak muscles or trouble seeing do not drive a car, use machinery, or do other dangerous activities. This medicine contains albumin from human blood. It may be possible to pass an infection in this medicine, but no cases have been reported. Talk to your doctor about the risks and benefits of this medicine. If your activities have been limited by your condition, go back to your regular routine slowly after treatment with this medicine. What side effects may I notice from receiving this medicine? Side effects that you should report to your doctor or health care professional as soon as possible:  allergic reactions like skin rash, itching or hives, swelling of the face, lips, or tongue  breathing problems  changes in vision  chest pain or tightness  eye irritation, pain  fast, irregular heartbeat  infection  numbness  speech problems  swallowing problems  unusual weakness Side effects that usually do not require medical attention (report to your doctor or health care professional if they continue or are bothersome):  bruising or pain at site where injected  drooping eyelid  dry eyes or  mouth  headache  muscles aches, pains  sensitivity to light  tearing This list may not describe all possible side effects. Call your doctor for medical advice about side effects. You may report side effects to FDA at 1-800-FDA-1088. Where should I keep my medicine? This drug is given in a hospital or clinic and will not be stored at home. NOTE: This sheet is a summary. It may not cover all possible information. If you have questions about this medicine, talk to your doctor, pharmacist, or health care provider.  2020 Elsevier/Gold Standard (2017-07-31 14:21:42)

## 2019-07-03 NOTE — Telephone Encounter (Signed)
Cheryl [name]: Patient would like botox but is concerned about the price, can you try to get her even a little info on what it might be? If she would like to have it she can go with Amy or Megan.  thanks

## 2019-07-03 NOTE — Telephone Encounter (Signed)
Lovena Le, let me know if pt decides to proceed and I will complete chart sheet for botox.

## 2019-07-05 DIAGNOSIS — H35372 Puckering of macula, left eye: Secondary | ICD-10-CM | POA: Diagnosis not present

## 2019-07-09 ENCOUNTER — Telehealth: Payer: Self-pay | Admitting: *Deleted

## 2019-07-09 NOTE — Telephone Encounter (Signed)
Completed Ajovy PA on Cover My Meds. Key: [id number].awaiting determination from [employer]. If needed, call [phone].

## 2019-07-10 ENCOUNTER — Other Ambulatory Visit: Payer: Self-pay | Admitting: *Deleted

## 2019-07-10 DIAGNOSIS — G43711 Chronic migraine without aura, intractable, with status migrainosus: Secondary | ICD-10-CM

## 2019-07-10 MED ORDER — AJOVY 225 MG/1.5ML ~~LOC~~ SOAJ: 225.0000 mg | SUBCUTANEOUS | 3 refills | Status: DC

## 2019-07-10 NOTE — Telephone Encounter (Signed)
Application for financial support request faxed to [employer]. Pt had completed this while in the office last week. I included a copy of the [employer] approval letter. Received a receipt of confirmation.

## 2019-07-10 NOTE — Telephone Encounter (Signed)
I spoke to patient about Botox Savings program . Cheryl [name] please Mail her the packet and submit for insurance . Thanks [name]

## 2019-07-10 NOTE — Telephone Encounter (Signed)
I called the patient's insurance and spoke with Izora Gala. She was only able to provide me the benefit information. Patient would have a $30.00 copay and 20% coinsurance. I called the patient and let her know that is all the information I could get. She stated she would also call her insurance and see if she could inquire about it. [name] Cox then called the patient and LVM about discussing the Botox savings program.

## 2019-07-10 NOTE — Telephone Encounter (Signed)
Phone rep checked office voicemail's at 1:02pm pt left message stating she was returning the call to [facility], please call.

## 2019-07-10 NOTE — Telephone Encounter (Signed)
Per Cover My Meds, PA Case: [id number], Status: Approved, Coverage Starts on: 07/09/2019 12:00:00 AM, Coverage Ends on: 02/07/2020 12:00:00 AM.  Patient also sent mychart message to [name] stating her copay is $90 for one month and $180 for 3 months.

## 2019-07-11 NOTE — Telephone Encounter (Signed)
Packet mailed.

## 2019-07-21 ENCOUNTER — Other Ambulatory Visit: Payer: Self-pay | Admitting: Sports Medicine

## 2019-07-23 ENCOUNTER — Ambulatory Visit: Payer: PPO

## 2019-07-29 DIAGNOSIS — R519 Headache, unspecified: Secondary | ICD-10-CM | POA: Diagnosis not present

## 2019-07-29 DIAGNOSIS — M47812 Spondylosis without myelopathy or radiculopathy, cervical region: Secondary | ICD-10-CM | POA: Diagnosis not present

## 2019-08-02 ENCOUNTER — Other Ambulatory Visit: Payer: Self-pay | Admitting: Family Medicine

## 2019-08-05 NOTE — Telephone Encounter (Signed)
Routing to covering provider.  °

## 2019-08-19 ENCOUNTER — Encounter: Payer: Self-pay | Admitting: Cardiovascular Disease

## 2019-08-19 ENCOUNTER — Other Ambulatory Visit: Payer: Self-pay | Admitting: Family Medicine

## 2019-08-19 ENCOUNTER — Ambulatory Visit: Payer: PPO | Admitting: Cardiovascular Disease

## 2019-08-19 ENCOUNTER — Other Ambulatory Visit: Payer: Self-pay

## 2019-08-19 VITALS — BP 130/74 | HR 80 | Ht 65.0 in | Wt 189.0 lb

## 2019-08-19 DIAGNOSIS — E7800 Pure hypercholesterolemia, unspecified: Secondary | ICD-10-CM | POA: Diagnosis not present

## 2019-08-19 DIAGNOSIS — I6523 Occlusion and stenosis of bilateral carotid arteries: Secondary | ICD-10-CM

## 2019-08-19 NOTE — Progress Notes (Signed)
Cardiology Office Note    Date:  08/19/2019   ID:  Cheryl [name], Cheryl [name], MRN [id number]  PCP:  Hali Marry, MD  Cardiologist:  [name], MD  Electrophysiologist:  None   Evaluation Performed:  Follow-Up Visit  Chief Complaint: Palpitations, shortness of breath  History of Present Illness:    Cheryl [name] is a 79 y.o. female with hyperlipidemia and mild carotid stenosis who presents for follow up.  She was first seen 04/2015 with a report of shortness of breath.  She had an echo 05/28/15 that revealed LVEF 55-60% with grade 1 diastolic dysfunction. She also had a Lexiscan Myoview 05/07/15 that revealed LVEF 60% with a small, fixed septal/anteroseptal defect that was felt to be artifact. There is no evidence of ischemia.  She has struggled with orthostatic hypotension and was instructed to wear compression stockings increase her salt intake. She underwent L4/L5 decompression surgery on 03/2016.  She then struggled to breathe due to her back brace.    At her last appointment she reported exertional dyspnea.  She was referred for a Lexiscan Myoview at that time revealed LVEF 69% with no ischemia.  Carotid Dopplers still revealed mild stenosis bilaterally.  At her last appointment she noted some heart racing.  She wore an ambulatory monitor that revealed some PACs and 4 beats of SVT.  She has not had any more palpitations.  She was offered to start metoprolol but decided is not bothering her enough to want another medication.  She recently started an injectable medication for her migraines and this is helped quite a bit.  She was having migraines daily and now has had only 1 headache.  She meets with our pharmacist to discuss PCSK9 inhibitors next week.  Overall she is feeling well and has no chest pain.  Her breathing is stable.  She denies any lower extremity edema, orthopnea, or PND.  She has been exercising as much lately because of the humidity.  When it cools off  she plans to start back walking outside with her dog.  She does try to walk around her house and sometimes goes to the store to walk.   Past Medical History:  Diagnosis Date  . Anemia   . Anxiety    d/t recent family issues  . Arthritis   . Bone spur   . Carotid stenosis, bilateral 06/06/2019  . Chronic back pain    stenosis and OA  . Depression    takes Paxil daily  . Diverticulosis   . Dizzy    occasionally  . Encephalitis    at 9 months ago   . Fatty liver    has had 2 Hep shots and the final one is in June 18  . GERD (gastroesophageal reflux disease)    takes Omeprazole daily  . Hepatitis 2012  . History of bronchitis    20-25 yrs ago  . History of colon polyps    precancerous  . History of shingles   . Hyperlipidemia    takes Simvastatin daily  . IBS (irritable bowel syndrome)    more on side of constipation-takes Miralax daily  . Insomnia    takes Melatonin and Ambien nightly  . Internal hemorrhoids   . Joint pain   . Joint swelling   . Lung nodules    calcified   . Migraine headache    migraines-thinks coming from neck.Last one1/28/18  . Pneumonia    hx of-20 yrs ago-=-walking  . Renal cyst  left  . Uterine cancer ([place]) 1973   Past Surgical History:  Procedure Laterality Date  . APPENDECTOMY  1966  . cataracts     bilateral  . COLONOSCOPY    . EUS  02/10/2011   Procedure: UPPER ENDOSCOPIC ULTRASOUND (EUS) RADIAL;  Surgeon: Owens Loffler, MD;  Location: [facility];  Service: Endoscopy;  Laterality: N/A;  radial linear   . LUMBAR FUSION  03/2016   L4-5, Dr. Ola Spurr.  Marland Kitchen RECTOCELE REPAIR  2011  . ROTATOR CUFF REPAIR  2009   left  . TONSILLECTOMY    . UPPER GASTROINTESTINAL ENDOSCOPY    . VAGINAL HYSTERECTOMY  1973     Current Meds  Medication Sig  . ALPRAZolam (XANAX) 0.5 MG tablet TAKE 1 TABLET BY MOUTH AT BEDTIME AS NEEDED FOR ANXIETY  . aspirin 81 MG tablet Take 81 mg by mouth daily.    . benzoyl peroxide (BENZOYL PEROXIDE) 5 % external  liquid Apply topically 2 (two) times daily.  . Cholecalciferol (VITAMIN D) 1000 UNITS capsule Take 1,000 Units by mouth daily.    . fluconazole (DIFLUCAN) 150 MG tablet Take 1 tablet (150 mg total) by mouth every other day.  . fluticasone (FLONASE) 50 MCG/ACT nasal spray INHALE 2 SPRAYS INTO EACH NOSTRIL EVERY DAY  . Fremanezumab-vfrm (AJOVY) 225 MG/1.5ML SOAJ Inject 225 mg into the skin every 30 (thirty) days.  . hydrOXYzine (VISTARIL) 25 MG capsule TAKE 1 CAPSULE (25 MG TOTAL) BY MOUTH 3 (THREE) TIMES DAILY AS NEEDED FOR ITCHING.  . MAGNESIUM PO Take 1 tablet by mouth daily.   Marland Kitchen nystatin (MYCOSTATIN/NYSTOP) powder Apply 1 application topically 2 (two) times daily as needed.  Marland Kitchen omeprazole (PRILOSEC) 20 MG capsule Take 20 mg by mouth daily.  Marland Kitchen PARoxetine (PAXIL) 20 MG tablet TAKE 1 TABLET BY MOUTH EVERY DAY IN THE MORNING  . polyethylene glycol (MIRALAX / GLYCOLAX) packet Take 17 g by mouth daily.  . RESTASIS 0.05 % ophthalmic emulsion Place 1 drop into both eyes 2 (two) times daily.   . traMADol (ULTRAM) 50 MG tablet TAKE 1 TABLET (50 MG TOTAL) BY MOUTH EVERY 12 (TWELVE) HOURS AS NEEDED.  Marland Kitchen zolpidem (AMBIEN) 5 MG tablet TAKE 1 TABLET (5 MG TOTAL) BY MOUTH AT BEDTIME AS NEEDED. FOR SLEEP     Allergies:   Fluoxetine hcl, Contrast media [iodinated diagnostic agents], Dexamethasone, Livalo [pitavastatin], Metrizamide, Nitroglycerin, Rosuvastatin, Simvastatin, Codeine sulfate, Sulfasalazine, and Sulfonamide derivatives   Social History   Tobacco Use  . Smoking status: Former Smoker    Quit date: 02/07/1993    Years since quitting: 26.5  . Smokeless tobacco: Never Used  Vaping Use  . Vaping Use: Never used  Substance Use Topics  . Alcohol use: No  . Drug use: No     Family Hx: The patient's family history includes Alcohol abuse in her brother and father; Crohn's disease in her brother; Depression in her brother; HIV in her son; Heart disease in an other family member; Heart failure in her  daughter; Kidney failure in her mother; Lung cancer in her father and son; Rheum arthritis in her mother. There is no history of Malignant hyperthermia, Colon cancer, Esophageal cancer, Rectal cancer, Stomach cancer, or Migraines.  ROS:   Please see the history of present illness.     All other systems reviewed and are negative.   Prior CV studies:   The following studies were reviewed today:  Echo 05/28/15: Study Conclusions  - Left ventricle: The cavity size was normal. Wall  thickness was  normal. Systolic function was normal. The estimated ejection  fraction was in the range of 55% to 60%. Wall motion was normal;  there were no regional wall motion abnormalities. Doppler  parameters are consistent with abnormal left ventricular  relaxation (grade 1 diastolic dysfunction).  Impressions:  - Normal LV systolic function; grade 1 diastolic dysfunction; trace  MR and TR.  Lexiscan Myoview 06/14/19:  Lexiscan stress: Electrically negative for ischemia  Myovue with normal perfusion No ischemia or scar  LVEF 69%  Low risk scan   3 Day Zio Monitor 06/28/2019:  Quality: Fair.  Baseline artifact. Predominant rhythm: sinus rhythm Average heart rate: 85 bpm Max heart rate: 164 bpm Min heart rate: 64 bpm Pauses >2.5 seconds: none  4 beats SVT PACs noted   Labs/Other Tests and Data Reviewed:    EKG:  An ECG dated 08/19/2019 was personally reviewed today and demonstrated:  Sinus rhythm.  Rate 80 bpm.  Low voltage.  Recent Labs: 12/20/2018: Magnesium 2.0; TSH 3.06   Recent Lipid Panel Lab Results  Component Value Date/Time   CHOL 201 (H) 10/04/2018 10:37 AM   CHOL 174 08/14/2017 08:24 AM   CHOL 244 (H) 03/02/2011 12:45 AM   TRIG 253 (H) 10/04/2018 10:37 AM   TRIG 303 (H) 03/02/2011 12:45 AM   HDL 41 (L) 10/04/2018 10:37 AM   HDL 44 08/14/2017 08:24 AM   HDL 45 03/02/2011 12:45 AM   CHOLHDL 4.9 10/04/2018 10:37 AM   LDLCALC 121 (H) 10/04/2018 10:37 AM    LDLCALC 138 (H) 03/02/2011 12:45 AM    Wt Readings from Last 3 Encounters:  08/19/19 189 lb (85.7 kg)  07/03/19 189 lb (85.7 kg)  06/18/19 189 lb (85.7 kg)     Objective:    VS:  BP 130/74   Pulse 80   Ht 5\' 5"  (1.651 m)   Wt 189 lb (85.7 kg)   SpO2 97%   BMI 31.45 kg/m  , BMI Body mass index is 31.45 kg/m. GENERAL:  Well appearing HEENT: Pupils equal round and reactive, fundi not visualized, oral mucosa unremarkable NECK:  No jugular venous distention, waveform within normal limits, carotid upstroke brisk and symmetric, no bruits LUNGS:  Clear to auscultation bilaterally HEART:  RRR.  PMI not displaced or sustained,S1 and S2 within normal limits, no S3, no S4, no clicks, no rubs, no murmurs ABD:  Flat, positive bowel sounds normal in frequency in pitch, no bruits, no rebound, no guarding, no midline pulsatile mass, no hepatomegaly, no splenomegaly EXT:  2 plus pulses throughout, no edema, no cyanosis no clubbing SKIN:  No rashes no nodules NEURO:  Cranial nerves II through XII grossly intact, motor grossly intact throughout PSYCH:  Cognitively intact, oriented to person place and time  ASSESSMENT & PLAN:    # Shortness of breath: Stable.  No ischemia on stress test 06/2019.  No evidence of heart failure on exam or prior echo.  # Hyperlipidemia: She has struggled with several statins.  She is currently on pitavastatin and can only take it once every week.  Her lipids are not at goal.  Her LDL should be less than 70.   She will see our pharmacist next week for PCSK9 inhibitor consideration.  # Carotid stenosis:  Mild disease in 2015.  That was stable on 05/2017 and 06/2019.  Lipid control as above.  Continue aspirin.   Medication Adjustments/Labs and Tests Ordered: Current medicines are reviewed at length with the patient today.  Concerns regarding  medicines are outlined above.   Tests Ordered: No orders of the defined types were placed in this encounter.   Medication  Changes: No orders of the defined types were placed in this encounter.   Follow Up:  With Trenell Concannon C. Oval Linsey, MD, [facility] in 1 year  Signed, [name], MD  08/19/2019 12:31 PM    [place]

## 2019-08-19 NOTE — Addendum Note (Signed)
Addended by: [name] C on: 08/19/2019 12:36 PM   Modules accepted: Level of Service

## 2019-08-19 NOTE — Patient Instructions (Signed)
Medication Instructions:  Your physician recommends that you continue on your current medications as directed. Please refer to the Current Medication list given to you today.  *If you need a refill on your cardiac medications before your next appointment, please call your pharmacy*   Lab Work: NONE   Testing/Procedures: NONE  Follow-Up: At [facility], you and your health needs are our priority.  As part of our continuing mission to provide you with exceptional heart care, we have created designated Provider Care Teams.  These Care Teams include your primary Cardiologist (physician) and Advanced Practice Providers (APPs -  Physician Assistants and Nurse Practitioners) who all work together to provide you with the care you need, when you need it.  We recommend signing up for the patient portal called "MyChart".  Sign up information is provided on this After Visit Summary.  MyChart is used to connect with patients for Virtual Visits (Telemedicine).  Patients are able to view lab/test results, encounter notes, upcoming appointments, etc.  Non-urgent messages can be sent to your provider as well.   To learn more about what you can do with MyChart, go to [email or web address].    Your next appointment:   12 month(s)  The format for your next appointment:   In Person  Provider:   You may see [name], MD or one of the following Advanced Practice Providers on your designated Care Team:    Kerin Ransom, PA-C  [place], PA-C  Coletta Memos, FNP  [place] AS SCHEDULED

## 2019-08-29 ENCOUNTER — Other Ambulatory Visit: Payer: Self-pay

## 2019-08-29 ENCOUNTER — Ambulatory Visit (INDEPENDENT_AMBULATORY_CARE_PROVIDER_SITE_OTHER): Payer: PPO | Admitting: Pharmacist

## 2019-08-29 VITALS — BP 122/68 | HR 83 | Resp 16 | Ht 65.0 in | Wt 191.0 lb

## 2019-08-29 DIAGNOSIS — E7800 Pure hypercholesterolemia, unspecified: Secondary | ICD-10-CM | POA: Diagnosis not present

## 2019-08-29 DIAGNOSIS — I6523 Occlusion and stenosis of bilateral carotid arteries: Secondary | ICD-10-CM | POA: Diagnosis not present

## 2019-08-29 NOTE — Patient Instructions (Signed)
Your Results:             Your most recent labs Goal  Total Cholesterol 201 < 200  Triglycerides 253 < 150  HDL (happy/good cholesterol) 41 > 40  LDL (lousy/bad cholesterol 121 < 70      Medication changes:  *Start paperwork for Repatha/Praluent every 14 days  Lab orders:  *Fasting blood work ASAP* and will repeat after 5th dose of new medication  Telephone: [phone]  Patient Assistance:  The [employer] foundation offers assistance to help pay for medication copays.  They will cover copays for all cholesterol lowering meds, including statins, fibrates, omega-3 oils, ezetimibe, Repatha, Praluent, Nexletol, Nexlizet.  The cards are usually good for $2,500 or 12 months, whichever comes first. 1. Go to healthwellfoundation.org 2. Click on "Apply Now" 3. Answer questions as to whom is applying (patient or representative) 4. Your disease fund will be "hypercholesterolemia - Medicare access" 5. They will ask questions about finances and which medications you are taking for cholesterol 6. When you submit, the approval is usually within minutes.  You will need to print the card information from the site 7. You will need to show this information to your pharmacy, they will bill your Medicare Part D plan first -then bill [employer] --for the copay.   You can also call them at [phone], although the hold times can be quite long.   Thank you for choosing [facility]

## 2019-08-29 NOTE — Progress Notes (Signed)
Patient ID: Cheryl [name]                 DOB: October 06, 1940                    MRN: [id number]     HPI: Cheryl [name] is a 79 y.o. female patient referred to lipid clinic by Dr Maryelizabeth Kaufmann. PMH is significant for hyperlipidemia, carotid stenosis, orthostatic hypertension, chronic migraines, and statin intolerance. Patient freports using statin for almost 20 years prior to developing intolerance.  She presents to ay for potential PCSK9i initiation and counseling.   Current Medications: none  Intolerances:  pituvastatin 4mg  - myalgia pituvastatin 2mg  pituvastatin 1mg  Rosuvastatin 10mg  - body aches and crampying Simvastatin 40mg  - muscle aches and HA simnastatin 20mg  simvsattin 10mg   LDL goal: 70mg /dL  Diet: mainly chicken, eats lots of fresh vegetables, and beans. Mainly home cooked meals.  Exercise: walks more in  the summer, mainly activities of daily living  Family History: includes Alcohol abuse in her brother and father; Crohn's disease in her brother; Depression in her brother; HIV in her son; Heart disease in an other family member; Heart failure in her daughter; Kidney failure in her mother; Lung cancer in her father and son; Rheum arthritis in her mother. There is no history of Malignant hyperthermia, Colon cancer, Esophageal cancer, Rectal cancer, Stomach cancer, or Migraines.  Social History: former smoker, no alcohol  Labs: 10/04/2018: CHO 201, HDL 41, TG 253, LDL-c 121  Past Medical History:  Diagnosis Date  . Anemia   . Anxiety    d/t recent family issues  . Arthritis   . Bone spur   . Carotid stenosis, bilateral 06/06/2019  . Chronic back pain    stenosis and OA  . Depression    takes Paxil daily  . Diverticulosis   . Dizzy    occasionally  . Encephalitis    at 9 months ago   . Fatty liver    has had 2 Hep shots and the final one is in June 18  . GERD (gastroesophageal reflux disease)    takes Omeprazole daily  . Hepatitis 2012  . History of bronchitis      20-25 yrs ago  . History of colon polyps    precancerous  . History of shingles   . Hyperlipidemia    takes Simvastatin daily  . IBS (irritable bowel syndrome)    more on side of constipation-takes Miralax daily  . Insomnia    takes Melatonin and Ambien nightly  . Internal hemorrhoids   . Joint pain   . Joint swelling   . Lung nodules    calcified   . Migraine headache    migraines-thinks coming from neck.Last one1/28/18  . Pneumonia    hx of-20 yrs ago-=-walking  . Renal cyst    left  . Uterine cancer ([place]) 1973    Current Outpatient Medications on File Prior to Visit  Medication Sig Dispense Refill  . ALPRAZolam (XANAX) 0.5 MG tablet TAKE 1 TABLET BY MOUTH AT BEDTIME AS NEEDED FOR ANXIETY 15 tablet 0  . aspirin 81 MG tablet Take 81 mg by mouth daily.      . Cholecalciferol (VITAMIN D) 1000 UNITS capsule Take 1,000 Units by mouth daily.      . fluticasone (FLONASE) 50 MCG/ACT nasal spray INHALE 2 SPRAYS INTO EACH NOSTRIL EVERY DAY 48 mL 1  . Fremanezumab-vfrm (AJOVY) 225 MG/1.5ML SOAJ Inject 225 mg into the skin every 30 (  thirty) days. 3 pen 3  . hydrOXYzine (VISTARIL) 25 MG capsule TAKE 1 CAPSULE (25 MG TOTAL) BY MOUTH 3 (THREE) TIMES DAILY AS NEEDED FOR ITCHING. 270 capsule 1  . MAGNESIUM PO Take 1 tablet by mouth daily.     Marland Kitchen nystatin (MYCOSTATIN/NYSTOP) powder Apply 1 application topically 2 (two) times daily as needed. 60 g 2  . omeprazole (PRILOSEC) 20 MG capsule Take 20 mg by mouth daily.    Marland Kitchen PARoxetine (PAXIL) 20 MG tablet TAKE 1 TABLET BY MOUTH EVERY DAY IN THE MORNING 90 tablet 1  . polyethylene glycol (MIRALAX / GLYCOLAX) packet Take 17 g by mouth daily.    . RESTASIS 0.05 % ophthalmic emulsion Place 1 drop into both eyes 2 (two) times daily.     . traMADol (ULTRAM) 50 MG tablet TAKE 1 TABLET (50 MG TOTAL) BY MOUTH EVERY 12 (TWELVE) HOURS AS NEEDED. 30 tablet 3  . zolpidem (AMBIEN) 5 MG tablet TAKE 1 TABLET (5 MG TOTAL) BY MOUTH AT BEDTIME AS NEEDED. FOR SLEEP  30 tablet 1   No current facility-administered medications on file prior to visit.    Allergies  Allergen Reactions  . Fluoxetine Hcl Shortness Of Breath and Itching    REACTION: itching,SOB  . Contrast Media [Iodinated Diagnostic Agents] Itching    Multilance.   . Dexamethasone Diarrhea and Nausea And Vomiting  . Livalo [Pitavastatin] Other (See Comments)    Myalgia   . Metrizamide Itching    Multilance.   . Nitroglycerin     Nausea,dizziness,sweats  . Rosuvastatin Other (See Comments)    bodyaches and cramping  . Simvastatin Other (See Comments)    Muscle aches and HA  . Codeine Sulfate Itching    REACTION: itching  . Sulfasalazine Nausea Only    REACTION: Nausea  . Sulfonamide Derivatives Nausea Only    REACTION: Nausea    Hyperlipidemia LDL above goal for secondary prevention and patient unable to tolerate statins. PCSK9i indication, common side effects, administration, storage monitoring, prior-authorization process, and patient assistance program were discussed during OV.   Will initiate paperwork for Repatha or Praluent prior-authorization and follow up as needed. Plan to repeat fasting lipid panel after 5th dose of new medication.    Angelique Chevalier Rodriguez-Guzman PharmD, BCPS, [place] [id number] 09/03/2019 12:07 PM

## 2019-08-30 DIAGNOSIS — E7800 Pure hypercholesterolemia, unspecified: Secondary | ICD-10-CM | POA: Diagnosis not present

## 2019-08-30 LAB — HEPATIC FUNCTION PANEL
ALT: 23 IU/L (ref 0–32)
AST: 28 IU/L (ref 0–40)
Albumin: 4.2 g/dL (ref 3.7–4.7)
Alkaline Phosphatase: 103 IU/L (ref 48–121)
Bilirubin Total: 0.4 mg/dL (ref 0.0–1.2)
Bilirubin, Direct: 0.09 mg/dL (ref 0.00–0.40)
Total Protein: 6.7 g/dL (ref 6.0–8.5)

## 2019-08-30 LAB — LIPID PANEL WITH LDL/HDL RATIO
Cholesterol, Total: 239 mg/dL — ABNORMAL HIGH (ref 100–199)
HDL: 39 mg/dL — ABNORMAL LOW (ref >39)
LDL Chol Calc (NIH): 147 mg/dL — ABNORMAL HIGH (ref 0–99)
LDL/HDL Ratio: 3.8 ratio — ABNORMAL HIGH (ref 0.0–3.2)
Triglycerides: 288 mg/dL — ABNORMAL HIGH (ref 0–149)
VLDL Cholesterol Cal: 53 mg/dL — ABNORMAL HIGH (ref 5–40)

## 2019-08-31 ENCOUNTER — Other Ambulatory Visit: Payer: Self-pay | Admitting: Family Medicine

## 2019-09-03 ENCOUNTER — Encounter: Payer: Self-pay | Admitting: Pharmacist

## 2019-09-03 NOTE — Assessment & Plan Note (Signed)
LDL above goal for secondary prevention and patient unable to tolerate statins. PCSK9i indication, common side effects, administration, storage monitoring, prior-authorization process, and patient assistance program were discussed during OV.   Will initiate paperwork for Repatha or Praluent prior-authorization and follow up as needed. Plan to repeat fasting lipid panel after 5th dose of new medication.

## 2019-09-04 ENCOUNTER — Encounter: Payer: Self-pay | Admitting: Adult Health

## 2019-09-04 ENCOUNTER — Telehealth: Payer: Self-pay

## 2019-09-04 DIAGNOSIS — E7800 Pure hypercholesterolemia, unspecified: Secondary | ICD-10-CM

## 2019-09-04 MED ORDER — REPATHA SURECLICK 140 MG/ML ~~LOC~~ SOAJ: 140.0000 mg | SUBCUTANEOUS | 11 refills | Status: DC

## 2019-09-04 NOTE — Telephone Encounter (Signed)
Called and spoke w/pt regarding the approval of the repatha, rx sent, [employer] approved and sent to the pt, pt instructed to come in for fasting labs 2 months into injections and also to call back if they need anything, pt voiced understanding

## 2019-09-16 DIAGNOSIS — M47812 Spondylosis without myelopathy or radiculopathy, cervical region: Secondary | ICD-10-CM | POA: Diagnosis not present

## 2019-09-26 DIAGNOSIS — M47812 Spondylosis without myelopathy or radiculopathy, cervical region: Secondary | ICD-10-CM | POA: Diagnosis not present

## 2019-10-07 DIAGNOSIS — Z03818 Encounter for observation for suspected exposure to other biological agents ruled out: Secondary | ICD-10-CM | POA: Diagnosis not present

## 2019-10-07 DIAGNOSIS — Z20822 Contact with and (suspected) exposure to covid-19: Secondary | ICD-10-CM | POA: Diagnosis not present

## 2019-10-22 DIAGNOSIS — M47812 Spondylosis without myelopathy or radiculopathy, cervical region: Secondary | ICD-10-CM | POA: Diagnosis not present

## 2019-10-30 DIAGNOSIS — H903 Sensorineural hearing loss, bilateral: Secondary | ICD-10-CM | POA: Diagnosis not present

## 2019-11-05 ENCOUNTER — Other Ambulatory Visit: Payer: Self-pay

## 2019-11-05 ENCOUNTER — Ambulatory Visit: Payer: PPO | Admitting: Adult Health

## 2019-11-05 ENCOUNTER — Encounter: Payer: Self-pay | Admitting: Adult Health

## 2019-11-05 VITALS — BP 116/70 | HR 76 | Ht 65.0 in | Wt 187.4 lb

## 2019-11-05 DIAGNOSIS — G43711 Chronic migraine without aura, intractable, with status migrainosus: Secondary | ICD-10-CM | POA: Diagnosis not present

## 2019-11-05 NOTE — Progress Notes (Addendum)
PATIENT: Cheryl [name] DOB: 1940/03/11  REASON FOR VISIT: follow up HISTORY FROM: patient  HISTORY OF PRESENT ILLNESS: Today 11/05/19:  Cheryl [name] is a 79 year old female with a history of cervicogenic headaches and migraine headaches.  She reports that overall she has been doing well.  She reports that since starting Ajovy she will go some weeks with no headaches at all.  Previously she was having daily headaches.  She does report on occasion she will wake up with headaches.  She states that the headaches are typically very mild.  She does report photophobia but denies nausea or vomiting.  She is unsure if she snores.  Denies excessive daytime fatigue.  Overall she feels that she has improved since her visit with Dr. Jaynee [name]  HISTORY Cheryl [name] is a 79 y.o. female here as requested by Cheryl [name] Cheryl [name] for migraines. I reviewed Cheryl [name] PAC's notes: Patient was being seen by [name] for cervicogenic headache, neck pain, she was last seen September 13, 2019 for bilateral C3 C2-C5 medial branch block.  She has chronic cervical pain with cervicogenic headache as well as chronic lumbar pain previously treated with tramadol, she is tried various medications for headache management such as nortriptyline and amitriptyline, amitriptyline was initially effective but then stopped, when she was seen for follow-up April 20 she had excellent response to the medial branch block and 80 to 85% improvement in her neck pain, little pain in the neck itself unfortunately she continued to describe headache daily, headache symptoms are very limiting, making it difficult for her to be active as well as difficult for her to sleep, no new symptoms or falls.  I was also able to review Dr. Fay Records notes pertaining to her headaches: Patient does have a history of migraines, she felt like they are coming from her neck, a lot of tension and tightness in her neck and a decrease in rotation to the  left, heat in the morning helps, inevitably during the day her headache starts in the last until she goes to bed, some days it is more severe than others, she tried needling which caused more pain, she is tried over-the-counter medications for the neck pain as well, headaches worsening since quarantine over a year, no visual changes, she was told she could not use medications for her migraines due to "white spots" on her brain, headaches can move around and can be on the top of the head and the side of the head or the back of the head, sometimes behind the eyes, usually more on one side.  She is tried amitriptyline as well as Topamax and multiple other medications.  She has been diagnosed with migraines for about 40 years, they slowly progressed and now they are debilitating, she can feel it coming in the back or the sides of her head most of the time in the temples, can be anytime, in the temples, she tried bote guards, it is throbbing, phono/photophobia, nausea, movement makes it worse, no vomiting, can last for up to a week off and on but one headache can last 24 hours. Daily headaches, 15 migraine days a month that are moderately severe to severe. No aura, no weakness, no numbness or tingling or weakness, heat may help a little on the neck otherwise nothing over the counter really helps she takes excedrin migraine, no known family history, no head trauma or inciting events, encephalitis as a child. Ongoing at thie frequency and severity and quality  for years.   Reviewed notes, labs and imaging from outside physicians, which showed:   MRI brain 05/13/2017: IMPRESSION: No acute abnormality. Progressive atrophy and chronic white matter changes since 2012.  Amitriptyline, gabapentin, propranolol, paxil, tramadol, trazodone   REVIEW OF SYSTEMS: Out of a complete 14 system review of symptoms, the patient complains only of the following symptoms, and all other reviewed systems are negative.  See HPI   ALLERGIES: Allergies  Allergen Reactions  . Fluoxetine Hcl Shortness Of Breath and Itching    REACTION: itching,SOB  . Contrast Media [Iodinated Diagnostic Agents] Itching    Multilance.   . Dexamethasone Diarrhea and Nausea And Vomiting  . Livalo [Pitavastatin] Other (See Comments)    Myalgia   . Metrizamide Itching    Multilance.   . Nitroglycerin     Nausea,dizziness,sweats  . Rosuvastatin Other (See Comments)    bodyaches and cramping  . Simvastatin Other (See Comments)    Muscle aches and HA  . Codeine Sulfate Itching    REACTION: itching  . Sulfasalazine Nausea Only    REACTION: Nausea  . Sulfonamide Derivatives Nausea Only    REACTION: Nausea    HOME MEDICATIONS: Outpatient Medications Prior to Visit  Medication Sig Dispense Refill  . ALPRAZolam (XANAX) 0.5 MG tablet TAKE 1 TABLET BY MOUTH AT BEDTIME AS NEEDED FOR ANXIETY 15 tablet 0  . aspirin 81 MG tablet Take 81 mg by mouth daily.      . Cholecalciferol (VITAMIN D) 1000 UNITS capsule Take 1,000 Units by mouth daily.      . Evolocumab (REPATHA SURECLICK) 676 MG/ML SOAJ Inject 140 mg into the skin every 14 (fourteen) days. 2 pen 11  . fluticasone (FLONASE) 50 MCG/ACT nasal spray INHALE 2 SPRAYS INTO EACH NOSTRIL EVERY DAY 48 mL 1  . Fremanezumab-vfrm (AJOVY) 225 MG/1.5ML SOAJ Inject 225 mg into the skin every 30 (thirty) days. 3 pen 3  . hydrOXYzine (VISTARIL) 25 MG capsule TAKE 1 CAPSULE (25 MG TOTAL) BY MOUTH 3 (THREE) TIMES DAILY AS NEEDED FOR ITCHING. 270 capsule 1  . MAGNESIUM PO Take 1 tablet by mouth daily.     Marland Kitchen omeprazole (PRILOSEC) 20 MG capsule Take 20 mg by mouth daily.    Marland Kitchen PARoxetine (PAXIL) 20 MG tablet TAKE 1 TABLET BY MOUTH EVERY DAY IN THE MORNING 90 tablet 1  . polyethylene glycol (MIRALAX / GLYCOLAX) packet Take 17 g by mouth daily.    . RESTASIS 0.05 % ophthalmic emulsion Place 1 drop into both eyes 2 (two) times daily.     . traMADol (ULTRAM) 50 MG tablet TAKE 1 TABLET (50 MG TOTAL) BY MOUTH  EVERY 12 (TWELVE) HOURS AS NEEDED. 30 tablet 3  . zolpidem (AMBIEN) 5 MG tablet TAKE 1 TABLET (5 MG TOTAL) BY MOUTH AT BEDTIME AS NEEDED. FOR SLEEP 30 tablet 1  . nystatin (MYCOSTATIN/NYSTOP) powder Apply 1 application topically 2 (two) times daily as needed. 60 g 2   No facility-administered medications prior to visit.    PAST MEDICAL HISTORY: Past Medical History:  Diagnosis Date  . Anemia   . Anxiety    d/t recent family issues  . Arthritis   . Bone spur   . Carotid stenosis, bilateral 06/06/2019  . Chronic back pain    stenosis and OA  . Depression    takes Paxil daily  . Diverticulosis   . Dizzy    occasionally  . Encephalitis    at 9 months ago   . Fatty liver  has had 2 Hep shots and the final one is in June 18  . GERD (gastroesophageal reflux disease)    takes Omeprazole daily  . Hepatitis 2012  . History of bronchitis    20-25 yrs ago  . History of colon polyps    precancerous  . History of shingles   . Hyperlipidemia    takes Simvastatin daily  . IBS (irritable bowel syndrome)    more on side of constipation-takes Miralax daily  . Insomnia    takes Melatonin and Ambien nightly  . Internal hemorrhoids   . Joint pain   . Joint swelling   . Lung nodules    calcified   . Migraine headache    migraines-thinks coming from neck.Last one1/28/18  . Pneumonia    hx of-20 yrs ago-=-walking  . Renal cyst    left  . Uterine cancer ([place]) 1973    PAST SURGICAL HISTORY: Past Surgical History:  Procedure Laterality Date  . APPENDECTOMY  1966  . cataracts     bilateral  . COLONOSCOPY    . EUS  02/10/2011   Procedure: UPPER ENDOSCOPIC ULTRASOUND (EUS) RADIAL;  Surgeon: Owens Loffler, MD;  Location: [facility];  Service: Endoscopy;  Laterality: N/A;  radial linear   . LUMBAR FUSION  03/2016   L4-5, Dr. Ola Spurr.  Marland Kitchen RECTOCELE REPAIR  2011  . ROTATOR CUFF REPAIR  2009   left  . TONSILLECTOMY    . UPPER GASTROINTESTINAL ENDOSCOPY    . VAGINAL HYSTERECTOMY   1973    FAMILY HISTORY: Family History  Problem Relation Age of Onset  . Alcohol abuse Father   . Lung cancer Father        smoker  . Kidney failure Mother        med induced  . Rheum arthritis Mother   . Depression Brother   . Heart disease Other        Paternal family   . Heart failure Daughter   . Lung cancer Son   . Crohn's disease Brother   . Alcohol abuse Brother   . HIV Son   . Malignant hyperthermia Neg Hx   . Colon cancer Neg Hx   . Esophageal cancer Neg Hx   . Rectal cancer Neg Hx   . Stomach cancer Neg Hx   . Migraines Neg Hx     SOCIAL HISTORY: Social History   Socioeconomic History  . Marital status: Divorced    Spouse name: Not on file  . Number of children: 4  . Years of education: Not on file  . Highest education level: Not on file  Occupational History  . Occupation: Retired      Fish farm manager: RETIRED  Tobacco Use  . Smoking status: Former Smoker    Quit date: 02/07/1993    Years since quitting: 26.7  . Smokeless tobacco: Never Used  Vaping Use  . Vaping Use: Never used  Substance and Sexual Activity  . Alcohol use: No  . Drug use: No  . Sexual activity: Not on file  Other Topics Concern  . Not on file  Social History Narrative   Walking daily.              Epworth Sleepiness Scale = 1 (as of 04/10/15)      07/03/2019   Lives at home alone with her dog   Right handed    Caffeine: tea occasional   Social Determinants of Health   Financial Resource Strain:   . Difficulty  of Paying Living Expenses: Not on file  Food Insecurity:   . Worried About Charity fundraiser in the Last Year: Not on file  . Ran Out of Food in the Last Year: Not on file  Transportation Needs:   . Lack of Transportation (Medical): Not on file  . Lack of Transportation (Non-Medical): Not on file  Physical Activity:   . Days of Exercise per Week: Not on file  . Minutes of Exercise per Session: Not on file  Stress:   . Feeling of Stress : Not on file  Social  Connections:   . Frequency of Communication with Friends and Family: Not on file  . Frequency of Social Gatherings with Friends and Family: Not on file  . Attends Religious Services: Not on file  . Active Member of Clubs or Organizations: Not on file  . Attends Archivist Meetings: Not on file  . Marital Status: Not on file  Intimate Partner Violence:   . Fear of Current or Ex-Partner: Not on file  . Emotionally Abused: Not on file  . Physically Abused: Not on file  . Sexually Abused: Not on file      PHYSICAL EXAM  Vitals:   11/05/19 0826  BP: 116/70  Pulse: 76  Weight: 187 lb 6.4 oz (85 kg)  Height: 5\' 5"  (1.651 m)   Body mass index is 31.18 kg/m.  Generalized: Well developed, in no acute distress   Neurological examination  Mentation: Alert oriented to time, place, history taking. Follows all commands speech and language fluent Cranial nerve II-XII: Pupils were equal round reactive to light. Extraocular movements were full, visual field were full on confrontational test. Head turning and shoulder shrug  were normal and symmetric. Motor: The motor testing reveals 5 over 5 strength of all 4 extremities. Good symmetric motor tone is noted throughout.  Sensory: Sensory testing is intact to soft touch on all 4 extremities. No evidence of extinction is noted.  Coordination: Cerebellar testing reveals good finger-nose-finger and heel-to-shin bilaterally.  Gait and station: Gait is normal. Reflexes: Deep tendon reflexes are symmetric and normal bilaterally.   DIAGNOSTIC DATA (LABS, IMAGING, TESTING) - I reviewed patient records, labs, notes, testing and imaging myself where available.  Lab Results  Component Value Date   WBC 7.1 09/26/2017   HGB 15.3 09/26/2017   HCT 46.5 (H) 09/26/2017   MCV 84.1 09/26/2017   PLT 279 09/26/2017      Component Value Date/Time   NA 139 07/31/2018 0750   NA 140 08/14/2017 0824   K 4.3 07/31/2018 0750   CL 104 07/31/2018 0750    CO2 26 07/31/2018 0750   GLUCOSE 103 (H) 07/31/2018 0750   BUN 12 07/31/2018 0750   BUN 14 08/14/2017 0824   CREATININE 0.82 07/31/2018 0750   CALCIUM 9.6 07/31/2018 0750   PROT 6.7 08/30/2019 0836   ALBUMIN 4.2 08/30/2019 0836   AST 28 08/30/2019 0836   ALT 23 08/30/2019 0836   ALKPHOS 103 08/30/2019 0836   BILITOT 0.4 08/30/2019 0836   GFRNONAA 70 07/31/2018 0750   GFRAA 81 07/31/2018 0750   Lab Results  Component Value Date   CHOL 239 (H) 08/30/2019   HDL 39 (L) 08/30/2019   LDLCALC 147 (H) 08/30/2019   TRIG 288 (H) 08/30/2019   CHOLHDL 4.9 10/04/2018   Lab Results  Component Value Date   HGBA1C 5.8 12/17/2013   Lab Results  Component Value Date   VITAMINB12 565 12/20/2018  Lab Results  Component Value Date   TSH 3.06 12/20/2018      ASSESSMENT AND PLAN 79 y.o. year old female  has a past medical history of Anemia, Anxiety, Arthritis, Bone spur, Carotid stenosis, bilateral (06/06/2019), Chronic back pain, Depression, Diverticulosis, Dizzy, Encephalitis, Fatty liver, GERD (gastroesophageal reflux disease), Hepatitis (2012), History of bronchitis, History of colon polyps, History of shingles, Hyperlipidemia, IBS (irritable bowel syndrome), Insomnia, Internal hemorrhoids, Joint pain, Joint swelling, Lung nodules, Migraine headache, Pneumonia, Renal cyst, and Uterine cancer ([place]) (1973). here with:  1.  Migraine headaches   Continue Ajovy  Can consider Botox in the future if Ajovy does not give benefit  Encourage the patient to keep a headache journal  Follow-up in 6 months or sooner if needed   I spent 25 minutes of face-to-face and non-face-to-face time with patient.  This included previsit chart review, lab review, study review, order entry, electronic health record documentation, patient education.  [name], MSN, NP-C 11/05/2019, 8:29 AM [facility] [address], [place], [place] [address] [phone]  Made any  corrections needed, and agree with history, physical, neuro exam,assessment and plan as stated.     Sarina Ill, MD [facility]

## 2019-11-05 NOTE — Patient Instructions (Signed)
Your Plan: ? ?Continue Ajovy ?If your symptoms worsen or you develop new symptoms please let us know.  ? ?Thank you for coming to see us at [facility]. I hope we have been able to provide you high quality care today. ? ?You may receive a patient satisfaction survey over the next few weeks. We would appreciate your feedback and comments so that we may continue to improve ourselves and the health of our patients. ? ?

## 2019-11-06 ENCOUNTER — Other Ambulatory Visit: Payer: Self-pay | Admitting: Family Medicine

## 2019-11-08 ENCOUNTER — Other Ambulatory Visit: Payer: Self-pay | Admitting: Pharmacist Clinician (PhC)/ Clinical Pharmacy Specialist

## 2019-11-08 DIAGNOSIS — E7800 Pure hypercholesterolemia, unspecified: Secondary | ICD-10-CM

## 2019-11-11 DIAGNOSIS — E7800 Pure hypercholesterolemia, unspecified: Secondary | ICD-10-CM | POA: Diagnosis not present

## 2019-11-11 LAB — LIPID PANEL
Chol/HDL Ratio: 3.5 ratio (ref 0.0–4.4)
Cholesterol, Total: 159 mg/dL (ref 100–199)
HDL: 46 mg/dL (ref >39)
LDL Chol Calc (NIH): 79 mg/dL (ref 0–99)
Triglycerides: 205 mg/dL — ABNORMAL HIGH (ref 0–149)
VLDL Cholesterol Cal: 34 mg/dL (ref 5–40)

## 2019-11-18 ENCOUNTER — Ambulatory Visit: Payer: PPO | Admitting: Family Medicine

## 2019-11-19 ENCOUNTER — Ambulatory Visit (INDEPENDENT_AMBULATORY_CARE_PROVIDER_SITE_OTHER): Payer: PPO | Admitting: Family Medicine

## 2019-11-19 ENCOUNTER — Encounter: Payer: Self-pay | Admitting: Family Medicine

## 2019-11-19 ENCOUNTER — Other Ambulatory Visit: Payer: Self-pay

## 2019-11-19 VITALS — BP 113/60 | HR 76 | Ht 65.0 in | Wt 186.0 lb

## 2019-11-19 DIAGNOSIS — R3911 Hesitancy of micturition: Secondary | ICD-10-CM | POA: Diagnosis not present

## 2019-11-19 DIAGNOSIS — E7800 Pure hypercholesterolemia, unspecified: Secondary | ICD-10-CM | POA: Diagnosis not present

## 2019-11-19 DIAGNOSIS — R300 Dysuria: Secondary | ICD-10-CM | POA: Diagnosis not present

## 2019-11-19 DIAGNOSIS — Z23 Encounter for immunization: Secondary | ICD-10-CM | POA: Diagnosis not present

## 2019-11-19 DIAGNOSIS — G43809 Other migraine, not intractable, without status migrainosus: Secondary | ICD-10-CM | POA: Diagnosis not present

## 2019-11-19 LAB — POCT URINALYSIS DIP (CLINITEK)
Bilirubin, UA: NEGATIVE
Blood, UA: NEGATIVE
Glucose, UA: NEGATIVE mg/dL
Ketones, POC UA: NEGATIVE mg/dL
Nitrite, UA: NEGATIVE
POC PROTEIN,UA: NEGATIVE
Spec Grav, UA: 1.015 (ref 1.010–1.025)
Urobilinogen, UA: 1 E.U./dL
pH, UA: 7 (ref 5.0–8.0)

## 2019-11-19 NOTE — Progress Notes (Signed)
Established Patient Office Visit  Subjective:  Patient ID: Cheryl [name], female    DOB: 1940-05-14  Age: 79 y.o. MRN: [id number]  CC:  Chief Complaint  Patient presents with  . Headache    HPI Cheryl [name] presents for f/u Headaches.  She is doing well on Ajovy. No longer having daly HA but still having frequent HA>  Says day after her last injection on 28th she has had a daily HA for the last week.  Still having a lot of pressure behind her eye but no significant nasal congestion.  Says her nose stays "wet" but this is chronic.  Still feels a lot of her HA are triggered by her neck. Considering Botox injections.    Urinary burning and hesitancy for a couple of months.    No blood in urine or pelvic pain or pressure.    Has been doing well on Repatha.  Occ LUQ pain.  Uses miralax daily for her bowels.    Past Medical History:  Diagnosis Date  . Anemia   . Anxiety    d/t recent family issues  . Arthritis   . Bone spur   . Carotid stenosis, bilateral 06/06/2019  . Chronic back pain    stenosis and OA  . Depression    takes Paxil daily  . Diverticulosis   . Dizzy    occasionally  . Encephalitis    at 9 months ago   . Fatty liver    has had 2 Hep shots and the final one is in June 18  . GERD (gastroesophageal reflux disease)    takes Omeprazole daily  . Hepatitis 2012  . History of bronchitis    20-25 yrs ago  . History of colon polyps    precancerous  . History of shingles   . Hyperlipidemia    takes Simvastatin daily  . IBS (irritable bowel syndrome)    more on side of constipation-takes Miralax daily  . Insomnia    takes Melatonin and Ambien nightly  . Internal hemorrhoids   . Joint pain   . Joint swelling   . Lung nodules    calcified   . Migraine headache    migraines-thinks coming from neck.Last one1/28/18  . Pneumonia    hx of-20 yrs ago-=-walking  . Renal cyst    left  . Uterine cancer ([place]) 1973    Past Surgical History:  Procedure  Laterality Date  . APPENDECTOMY  1966  . cataracts     bilateral  . COLONOSCOPY    . EUS  02/10/2011   Procedure: UPPER ENDOSCOPIC ULTRASOUND (EUS) RADIAL;  Surgeon: Owens Loffler, MD;  Location: [facility];  Service: Endoscopy;  Laterality: N/A;  radial linear   . LUMBAR FUSION  03/2016   L4-5, Dr. Ola Spurr.  Marland Kitchen RECTOCELE REPAIR  2011  . ROTATOR CUFF REPAIR  2009   left  . TONSILLECTOMY    . UPPER GASTROINTESTINAL ENDOSCOPY    . VAGINAL HYSTERECTOMY  1973    Family History  Problem Relation Age of Onset  . Alcohol abuse Father   . Lung cancer Father        smoker  . Kidney failure Mother        med induced  . Rheum arthritis Mother   . Depression Brother   . Heart disease Other        Paternal family   . Heart failure Daughter   . Lung cancer Son   . Crohn's disease  Brother   . Alcohol abuse Brother   . HIV Son   . Malignant hyperthermia Neg Hx   . Colon cancer Neg Hx   . Esophageal cancer Neg Hx   . Rectal cancer Neg Hx   . Stomach cancer Neg Hx   . Migraines Neg Hx     Social History   Socioeconomic History  . Marital status: Divorced    Spouse name: Not on file  . Number of children: 4  . Years of education: Not on file  . Highest education level: Not on file  Occupational History  . Occupation: Retired      Fish farm manager: RETIRED  Tobacco Use  . Smoking status: Former Smoker    Quit date: 02/07/1993    Years since quitting: 26.7  . Smokeless tobacco: Never Used  Vaping Use  . Vaping Use: Never used  Substance and Sexual Activity  . Alcohol use: No  . Drug use: No  . Sexual activity: Not on file  Other Topics Concern  . Not on file  Social History Narrative   Walking daily.              Epworth Sleepiness Scale = 1 (as of 04/10/15)      07/03/2019   Lives at home alone with her dog   Right handed    Caffeine: tea occasional   Social Determinants of Health   Financial Resource Strain:   . Difficulty of Paying Living Expenses: Not on file   Food Insecurity:   . Worried About Charity fundraiser in the Last Year: Not on file  . Ran Out of Food in the Last Year: Not on file  Transportation Needs:   . Lack of Transportation (Medical): Not on file  . Lack of Transportation (Non-Medical): Not on file  Physical Activity:   . Days of Exercise per Week: Not on file  . Minutes of Exercise per Session: Not on file  Stress:   . Feeling of Stress : Not on file  Social Connections:   . Frequency of Communication with Friends and Family: Not on file  . Frequency of Social Gatherings with Friends and Family: Not on file  . Attends Religious Services: Not on file  . Active Member of Clubs or Organizations: Not on file  . Attends Archivist Meetings: Not on file  . Marital Status: Not on file  Intimate Partner Violence:   . Fear of Current or Ex-Partner: Not on file  . Emotionally Abused: Not on file  . Physically Abused: Not on file  . Sexually Abused: Not on file    Outpatient Medications Prior to Visit  Medication Sig Dispense Refill  . ALPRAZolam (XANAX) 0.5 MG tablet TAKE 1 TABLET BY MOUTH AT BEDTIME AS NEEDED FOR ANXIETY 15 tablet 0  . aspirin 81 MG tablet Take 81 mg by mouth daily.      . Cholecalciferol (VITAMIN D) 1000 UNITS capsule Take 1,000 Units by mouth daily.      . Evolocumab (REPATHA SURECLICK) 767 MG/ML SOAJ Inject 140 mg into the skin every 14 (fourteen) days. 2 pen 11  . fluticasone (FLONASE) 50 MCG/ACT nasal spray INHALE 2 SPRAYS INTO EACH NOSTRIL EVERY DAY 48 mL 1  . Fremanezumab-vfrm (AJOVY) 225 MG/1.5ML SOAJ Inject 225 mg into the skin every 30 (thirty) days. 3 pen 3  . hydrOXYzine (VISTARIL) 25 MG capsule TAKE 1 CAPSULE (25 MG TOTAL) BY MOUTH 3 (THREE) TIMES DAILY AS NEEDED FOR ITCHING. [place]  capsule 1  . MAGNESIUM PO Take 1 tablet by mouth daily.     Marland Kitchen omeprazole (PRILOSEC) 20 MG capsule Take 20 mg by mouth daily.    Marland Kitchen PARoxetine (PAXIL) 20 MG tablet TAKE 1 TABLET BY MOUTH EVERY DAY IN THE MORNING  90 tablet 1  . polyethylene glycol (MIRALAX / GLYCOLAX) packet Take 17 g by mouth daily.    . RESTASIS 0.05 % ophthalmic emulsion Place 1 drop into both eyes 2 (two) times daily.     . traMADol (ULTRAM) 50 MG tablet TAKE 1 TABLET (50 MG TOTAL) BY MOUTH EVERY 12 (TWELVE) HOURS AS NEEDED. 30 tablet 3  . zolpidem (AMBIEN) 5 MG tablet TAKE 1 TABLET (5 MG TOTAL) BY MOUTH AT BEDTIME AS NEEDED. FOR SLEEP 30 tablet 1   No facility-administered medications prior to visit.    Allergies  Allergen Reactions  . Fluoxetine Hcl Shortness Of Breath and Itching    REACTION: itching,SOB  . Amitriptyline     Dry mouth   . Contrast Media [Iodinated Diagnostic Agents] Itching    Multilance.   . Dexamethasone Diarrhea and Nausea And Vomiting  . Livalo [Pitavastatin] Other (See Comments)    Myalgia   . Metrizamide Itching    Multilance.   . Nitroglycerin     Nausea,dizziness,sweats  . Rosuvastatin Other (See Comments)    bodyaches and cramping  . Simvastatin Other (See Comments)    Muscle aches and HA  . Codeine Sulfate Itching    REACTION: itching  . Sulfasalazine Nausea Only    REACTION: Nausea  . Sulfonamide Derivatives Nausea Only    REACTION: Nausea    ROS Review of Systems    Objective:    Physical Exam Vitals reviewed.  Constitutional:      Appearance: She is well-developed.  HENT:     Head: Normocephalic and atraumatic.  Eyes:     Conjunctiva/sclera: Conjunctivae normal.  Cardiovascular:     Rate and Rhythm: Normal rate.  Pulmonary:     Effort: Pulmonary effort is normal.  Skin:    General: Skin is dry.     Coloration: Skin is not pale.  Neurological:     Mental Status: She is alert and oriented to person, place, and time.  Psychiatric:        Behavior: Behavior normal.     BP 113/60   Pulse 76   Ht 5\' 5"  (1.651 m)   Wt 186 lb (84.4 kg)   SpO2 98%   BMI 30.95 kg/m  Wt Readings from Last 3 Encounters:  11/19/19 186 lb (84.4 kg)  11/05/19 187 lb 6.4 oz (85 kg)   08/29/19 191 lb (86.6 kg)     Health Maintenance Due  Topic Date Due  . Hepatitis C Screening  Never done    There are no preventive care reminders to display for this patient.  Lab Results  Component Value Date   TSH 3.06 12/20/2018   Lab Results  Component Value Date   WBC 7.1 09/26/2017   HGB 15.3 09/26/2017   HCT 46.5 (H) 09/26/2017   MCV 84.1 09/26/2017   PLT 279 09/26/2017   Lab Results  Component Value Date   NA 139 07/31/2018   K 4.3 07/31/2018   CO2 26 07/31/2018   GLUCOSE 103 (H) 07/31/2018   BUN 12 07/31/2018   CREATININE 0.82 07/31/2018   BILITOT 0.4 08/30/2019   ALKPHOS 103 08/30/2019   AST 28 08/30/2019   ALT 23 08/30/2019   PROT  6.7 08/30/2019   ALBUMIN 4.2 08/30/2019   CALCIUM 9.6 07/31/2018   ANIONGAP 9 03/07/2016   Lab Results  Component Value Date   CHOL 159 11/11/2019   Lab Results  Component Value Date   HDL 46 11/11/2019   Lab Results  Component Value Date   LDLCALC 79 11/11/2019   Lab Results  Component Value Date   TRIG 205 (H) 11/11/2019   Lab Results  Component Value Date   CHOLHDL 3.5 11/11/2019   Lab Results  Component Value Date   HGBA1C 5.8 12/17/2013      Assessment & Plan:   Problem List Items Addressed This Visit      Cardiovascular and Mediastinum   Migraine headache    Improved with Ajovy but wants them better. I think would be a great candidate for Botox injections. Encouraged her to reach out to Neuro to schedule. I agree. I think her neck issues contribute to her migraines.          Other   Hyperlipidemia    Doing well on Repatha thus far!!        Other Visit Diagnoses    Burning with urination    -  Primary   Relevant Orders   POCT URINALYSIS DIP (CLINITEK) (Completed)   Urine Culture   Urinary hesitancy       Relevant Orders   POCT URINALYSIS DIP (CLINITEK) (Completed)   Urine Culture   Need for immunization against influenza       Relevant Orders   Flu Vaccine QUAD High Dose(Fluad)  (Completed)      Dysuria - UA only pos for leuks. Will send for culture. If negative consider urethritis.  No orders of the defined types were placed in this encounter.   Follow-up: No follow-ups on file.    [name], MD

## 2019-11-19 NOTE — Assessment & Plan Note (Signed)
Improved with Ajovy but wants them better. I think would be a great candidate for Botox injections. Encouraged her to reach out to Neuro to schedule. I agree. I think her neck issues contribute to her migraines.

## 2019-11-19 NOTE — Assessment & Plan Note (Signed)
Doing well on Repatha thus far!!

## 2019-11-20 LAB — URINE CULTURE
MICRO NUMBER:: 11062117
SPECIMEN QUALITY:: ADEQUATE

## 2019-11-25 ENCOUNTER — Encounter: Payer: Self-pay | Admitting: Family Medicine

## 2019-11-26 ENCOUNTER — Other Ambulatory Visit: Payer: Self-pay | Admitting: *Deleted

## 2019-11-26 MED ORDER — OMEPRAZOLE 20 MG PO CPDR: DELAYED_RELEASE_CAPSULE | ORAL | 3 refills | Status: DC

## 2019-11-26 MED ORDER — FLUTICASONE PROPIONATE 50 MCG/ACT NA SUSP: NASAL | 1 refills | Status: DC

## 2019-12-09 ENCOUNTER — Other Ambulatory Visit: Payer: Self-pay | Admitting: Family Medicine

## 2019-12-09 DIAGNOSIS — F419 Anxiety disorder, unspecified: Secondary | ICD-10-CM

## 2019-12-21 ENCOUNTER — Ambulatory Visit: Payer: PPO | Attending: Internal Medicine

## 2019-12-21 DIAGNOSIS — Z23 Encounter for immunization: Secondary | ICD-10-CM

## 2019-12-21 NOTE — Progress Notes (Signed)
   Covid-19 Vaccination Clinic  Name:  Cheryl [name]    MRN: [id number] DOB: 1941-02-03  12/21/2019  [name] was observed post Covid-19 immunization for 15 minutes without incident. She was provided with Vaccine Information Sheet and instruction to access the V-Safe system.   [name] was instructed to call 911 with any severe reactions post vaccine: Marland Kitchen Difficulty breathing  . Swelling of face and throat  . A fast heartbeat  . A bad rash all over body  . Dizziness and weakness   Immunizations Administered    Name Date Dose VIS Date Route   Pfizer COVID-19 Vaccine 12/21/2019  3:03 PM 0.3 mL 11/27/2019 Intramuscular   Manufacturer: [place]   Lot: [id number]   [place]: [address]-2

## 2020-01-01 ENCOUNTER — Encounter: Payer: Self-pay | Admitting: Family Medicine

## 2020-01-01 ENCOUNTER — Ambulatory Visit (INDEPENDENT_AMBULATORY_CARE_PROVIDER_SITE_OTHER): Payer: PPO | Admitting: Family Medicine

## 2020-01-01 ENCOUNTER — Other Ambulatory Visit: Payer: Self-pay

## 2020-01-01 VITALS — BP 121/54 | HR 72 | Ht 65.0 in | Wt 185.0 lb

## 2020-01-01 DIAGNOSIS — J8410 Pulmonary fibrosis, unspecified: Secondary | ICD-10-CM

## 2020-01-01 DIAGNOSIS — Z0000 Encounter for general adult medical examination without abnormal findings: Secondary | ICD-10-CM

## 2020-01-01 DIAGNOSIS — E611 Iron deficiency: Secondary | ICD-10-CM | POA: Diagnosis not present

## 2020-01-01 DIAGNOSIS — E7800 Pure hypercholesterolemia, unspecified: Secondary | ICD-10-CM | POA: Diagnosis not present

## 2020-01-01 MED ORDER — FLUTICASONE PROPIONATE 50 MCG/ACT NA SUSP: NASAL | 2 refills | Status: DC

## 2020-01-01 MED ORDER — AMBULATORY NON FORMULARY MEDICATION: 0 refills | Status: DC

## 2020-01-01 NOTE — Assessment & Plan Note (Addendum)
History multiple pulmonary nodules.  Stable since 2005.  No active lung disease.

## 2020-01-01 NOTE — Progress Notes (Signed)
Subjective:     Cheryl [name] is a 79 y.o. female and is here for a comprehensive physical exam. The patient reports no problems.  She is doing well. She is active but no specific exercise.    Social History   Socioeconomic History  . Marital status: Divorced    Spouse name: Not on file  . Number of children: 4  . Years of education: Not on file  . Highest education level: Not on file  Occupational History  . Occupation: Retired      Fish farm manager: RETIRED  Tobacco Use  . Smoking status: Former Smoker    Quit date: 02/07/1993    Years since quitting: 26.9  . Smokeless tobacco: Never Used  Vaping Use  . Vaping Use: Never used  Substance and Sexual Activity  . Alcohol use: No  . Drug use: No  . Sexual activity: Not on file  Other Topics Concern  . Not on file  Social History Narrative   Walking daily.              Epworth Sleepiness Scale = 1 (as of 04/10/15)      07/03/2019   Lives at home alone with her dog   Right handed    Caffeine: tea occasional   Social Determinants of Health   Financial Resource Strain:   . Difficulty of Paying Living Expenses: Not on file  Food Insecurity:   . Worried About Charity fundraiser in the Last Year: Not on file  . Ran Out of Food in the Last Year: Not on file  Transportation Needs:   . Lack of Transportation (Medical): Not on file  . Lack of Transportation (Non-Medical): Not on file  Physical Activity:   . Days of Exercise per Week: Not on file  . Minutes of Exercise per Session: Not on file  Stress:   . Feeling of Stress : Not on file  Social Connections:   . Frequency of Communication with Friends and Family: Not on file  . Frequency of Social Gatherings with Friends and Family: Not on file  . Attends Religious Services: Not on file  . Active Member of Clubs or Organizations: Not on file  . Attends Archivist Meetings: Not on file  . Marital Status: Not on file  Intimate Partner Violence:   . Fear of Current  or Ex-Partner: Not on file  . Emotionally Abused: Not on file  . Physically Abused: Not on file  . Sexually Abused: Not on file   Health Maintenance  Topic Date Due  . Hepatitis C Screening  Never done  . COLONOSCOPY  11/03/2020  . TETANUS/TDAP  10/11/2022  . INFLUENZA VACCINE  Completed  . DEXA SCAN  Completed  . COVID-19 Vaccine  Completed  . PNA vac Low Risk Adult  Completed    The following portions of the patient's history were reviewed and updated as appropriate: allergies, current medications, past family history, past medical history, past social history, past surgical history and problem list.  Review of Systems A comprehensive review of systems was negative.   Objective:    BP (!) 121/54   Pulse 72   Ht 5\' 5"  (1.651 m)   Wt 185 lb (83.9 kg)   SpO2 97%   BMI 30.79 kg/m  General appearance: alert, cooperative and appears stated age Head: Normocephalic, without obvious abnormality, atraumatic Eyes: conj claer, EOMi, PEERLA Ears: normal TM's and external ear canals both ears Nose: Nares normal. Septum midline.  Mucosa normal. No drainage or sinus tenderness. Throat: lips, mucosa, and tongue normal; teeth and gums normal Neck: no adenopathy, no carotid bruit, no JVD, supple, symmetrical, trachea midline and thyroid not enlarged, symmetric, no tenderness/mass/nodules Back: symmetric, no curvature. ROM normal. No CVA tenderness. Lungs: clear to auscultation bilaterally Breasts: normal appearance, no masses or tenderness Heart: regular rate and rhythm, S1, S2 normal, no murmur, click, rub or gallop Abdomen: soft, non-tender; bowel sounds normal; no masses,  no organomegaly Extremities: extremities normal, atraumatic, no cyanosis or edema Pulses: 2+ and symmetric Skin: Skin color, texture, turgor normal. No rashes or lesions Lymph nodes: Cervical, supraclavicular, and axillary nodes normal. Neurologic: Alert and oriented X 3, normal strength and tone. Normal symmetric  reflexes. Normal coordination and gait    Assessment:    Healthy female exam.      Plan:     See After Visit Summary for Counseling Recommendations   Keep up a regular exercise program and make sure you are eating a healthy diet Try to eat 4 servings of dairy a day, or if you are lactose intolerant take a calcium with vitamin D daily.  Your vaccines are up to date.  Discussed getting the new shingles vaccine she has had the old Zostavax.  Scription printed. Will get up dated labs.    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  No active lung disease.

## 2020-01-01 NOTE — Patient Instructions (Signed)
Health Maintenance After Age 77 After age 77, you are at a higher risk for certain long-term diseases and infections as well as injuries from falls. Falls are a major cause of broken bones and head injuries in people who are older than age 77. Getting regular preventive care can help to keep you healthy and well. Preventive care includes getting regular testing and making lifestyle changes as recommended by your health care provider. Talk with your health care provider about:  Which screenings and tests you should have. A screening is a test that checks for a disease when you have no symptoms.  A diet and exercise plan that is right for you. What should I know about screenings and tests to prevent falls? Screening and testing are the best ways to find a health problem early. Early diagnosis and treatment give you the best chance of managing medical conditions that are common after age 77. Certain conditions and lifestyle choices may make you more likely to have a fall. Your health care provider may recommend:  Regular vision checks. Poor vision and conditions such as cataracts can make you more likely to have a fall. If you wear glasses, make sure to get your prescription updated if your vision changes.  Medicine review. Work with your health care provider to regularly review all of the medicines you are taking, including over-the-counter medicines. Ask your health care provider about any side effects that may make you more likely to have a fall. Tell your health care provider if any medicines that you take make you feel dizzy or sleepy.  Osteoporosis screening. Osteoporosis is a condition that causes the bones to get weaker. This can make the bones weak and cause them to break more easily.  Blood pressure screening. Blood pressure changes and medicines to control blood pressure can make you feel dizzy.  Strength and balance checks. Your health care provider may recommend certain tests to check your  strength and balance while standing, walking, or changing positions.  Foot health exam. Foot pain and numbness, as well as not wearing proper footwear, can make you more likely to have a fall.  Depression screening. You may be more likely to have a fall if you have a fear of falling, feel emotionally low, or feel unable to do activities that you used to do.  Alcohol use screening. Using too much alcohol can affect your balance and may make you more likely to have a fall. What actions can I take to lower my risk of falls? General instructions  Talk with your health care provider about your risks for falling. Tell your health care provider if: ? You fall. Be sure to tell your health care provider about all falls, even ones that seem minor. ? You feel dizzy, sleepy, or off-balance.  Take over-the-counter and prescription medicines only as told by your health care provider. These include any supplements.  Eat a healthy diet and maintain a healthy weight. A healthy diet includes low-fat dairy products, low-fat (lean) meats, and fiber from whole grains, beans, and lots of fruits and vegetables. Home safety  Remove any tripping hazards, such as rugs, cords, and clutter.  Install safety equipment such as grab bars in bathrooms and safety rails on stairs.  Keep rooms and walkways well-lit. Activity   Follow a regular exercise program to stay fit. This will help you maintain your balance. Ask your health care provider what types of exercise are appropriate for you.  If you need a cane or   walker, use it as recommended by your health care provider.  Wear supportive shoes that have nonskid soles. Lifestyle  Do not drink alcohol if your health care provider tells you not to drink.  If you drink alcohol, limit how much you have: ? 0-1 drink a day for women. ? 0-2 drinks a day for men.  Be aware of how much alcohol is in your drink. In the [place] one drink equals one typical bottle of beer (12  oz), one-half glass of wine (5 oz), or one shot of hard liquor (1 oz).  Do not use any products that contain nicotine or tobacco, such as cigarettes and e-cigarettes. If you need help quitting, ask your health care provider. Summary  Having a healthy lifestyle and getting preventive care can help to protect your health and wellness after age 77.  Screening and testing are the best way to find a health problem early and help you avoid having a fall. Early diagnosis and treatment give you the best chance for managing medical conditions that are more common for people who are older than age 77.  Falls are a major cause of broken bones and head injuries in people who are older than age 77. Take precautions to prevent a fall at home.  Work with your health care provider to learn what changes you can make to improve your health and wellness and to prevent falls. This information is not intended to replace advice given to you by your health care provider. Make sure you discuss any questions you have with your health care provider. Document Revised: 05/17/2018 Document Reviewed: 12/07/2016 Elsevier Patient Education  2020 Elsevier Inc.  

## 2020-01-03 LAB — COMPLETE METABOLIC PANEL WITH GFR
AG Ratio: 1.8 (calc) (ref 1.0–2.5)
ALT: 20 U/L (ref 6–29)
AST: 26 U/L (ref 10–35)
Albumin: 4.2 g/dL (ref 3.6–5.1)
Alkaline phosphatase (APISO): 81 U/L (ref 37–153)
BUN: 15 mg/dL (ref 7–25)
CO2: 28 mmol/L (ref 20–32)
Calcium: 9.9 mg/dL (ref 8.6–10.4)
Chloride: 102 mmol/L (ref 98–110)
Creat: 0.92 mg/dL (ref 0.60–0.93)
GFR, Est African American: 70 mL/min/{1.73_m2} (ref >=60)
GFR, Est Non African American: 60 mL/min/{1.73_m2} (ref >=60)
Globulin: 2.3 g/dL (calc) (ref 1.9–3.7)
Glucose, Bld: 89 mg/dL (ref 65–99)
Potassium: 4.6 mmol/L (ref 3.5–5.3)
Sodium: 139 mmol/L (ref 135–146)
Total Bilirubin: 0.4 mg/dL (ref 0.2–1.2)
Total Protein: 6.5 g/dL (ref 6.1–8.1)

## 2020-01-03 LAB — CBC
HCT: 41.1 % (ref 35.0–45.0)
Hemoglobin: 14 g/dL (ref 11.7–15.5)
MCH: 29.7 pg (ref 27.0–33.0)
MCHC: 34.1 g/dL (ref 32.0–36.0)
MCV: 87.1 fL (ref 80.0–100.0)
MPV: 10 fL (ref 7.5–12.5)
Platelets: 278 10*3/uL (ref 140–400)
RBC: 4.72 10*6/uL (ref 3.80–5.10)
RDW: 12.8 % (ref 11.0–15.0)
WBC: 6.9 10*3/uL (ref 3.8–10.8)

## 2020-01-03 LAB — HEPATITIS C ANTIBODY
Hepatitis C Ab: NONREACTIVE
SIGNAL TO CUT-OFF: 0.01 (ref <1.00)

## 2020-01-03 LAB — TSH: TSH: 3.08 mIU/L (ref 0.40–4.50)

## 2020-01-09 ENCOUNTER — Other Ambulatory Visit: Payer: Self-pay | Admitting: Family Medicine

## 2020-01-09 ENCOUNTER — Telehealth: Payer: Self-pay | Admitting: *Deleted

## 2020-01-09 NOTE — Telephone Encounter (Signed)
Received fax from [employer] for PA on Ajovy, requests last office notes as well. Form filled in, placed on MD's desk for signature.

## 2020-01-13 NOTE — Telephone Encounter (Signed)
Dr Jaynee Eagles signed PA form. I have faxed this to [employer]	[place]. Received a receipt of confirmation. Will update when we have received a determination.

## 2020-01-14 ENCOUNTER — Telehealth: Payer: Self-pay | Admitting: Neurology

## 2020-01-14 NOTE — Telephone Encounter (Signed)
We received a fax from [place] stating Ajovy 225 mg has been APPROVED through 02/06/2021. I faxed this to the pt's pharmacy. Received a receipt of confirmation.

## 2020-01-14 NOTE — Telephone Encounter (Signed)
[employer] is asking for a call back for additional questions about a PA. Phone number [phone] option 3 reference number [id number]

## 2020-01-14 NOTE — Telephone Encounter (Signed)
We received a fax from [place] stating Ajovy 225 mg has been APPROVED through 02/06/2021. I faxed this to the pt's pharmacy. Received a receipt of confirmation.

## 2020-01-22 ENCOUNTER — Other Ambulatory Visit: Payer: Self-pay | Admitting: Family Medicine

## 2020-01-22 DIAGNOSIS — M47812 Spondylosis without myelopathy or radiculopathy, cervical region: Secondary | ICD-10-CM | POA: Diagnosis not present

## 2020-01-22 DIAGNOSIS — Z1231 Encounter for screening mammogram for malignant neoplasm of breast: Secondary | ICD-10-CM

## 2020-01-22 DIAGNOSIS — R519 Headache, unspecified: Secondary | ICD-10-CM | POA: Diagnosis not present

## 2020-01-24 ENCOUNTER — Encounter: Payer: Self-pay | Admitting: Family Medicine

## 2020-01-24 DIAGNOSIS — R519 Headache, unspecified: Secondary | ICD-10-CM

## 2020-01-24 DIAGNOSIS — J3489 Other specified disorders of nose and nasal sinuses: Secondary | ICD-10-CM

## 2020-01-27 ENCOUNTER — Telehealth (INDEPENDENT_AMBULATORY_CARE_PROVIDER_SITE_OTHER): Payer: PPO | Admitting: Family Medicine

## 2020-01-27 ENCOUNTER — Encounter: Payer: Self-pay | Admitting: Family Medicine

## 2020-01-27 DIAGNOSIS — G43711 Chronic migraine without aura, intractable, with status migrainosus: Secondary | ICD-10-CM

## 2020-01-27 DIAGNOSIS — G8929 Other chronic pain: Secondary | ICD-10-CM | POA: Diagnosis not present

## 2020-01-27 DIAGNOSIS — M542 Cervicalgia: Secondary | ICD-10-CM

## 2020-01-27 DIAGNOSIS — G43809 Other migraine, not intractable, without status migrainosus: Secondary | ICD-10-CM

## 2020-01-27 NOTE — Progress Notes (Signed)
She stated that her daughter [name] wanted to speak to Dr. Madilyn Fireman about her headaches. She feels that she is not feeling well most days and her pain is more. She would like for her to be seen by someone who specializes in Headaches. His name is [name] he is in [place]. She would like for this to be sent as an URGENT referral and that it will take their office 2 weeks to review her medical records and she would get an appointment around [facility] February.  She states that her mother's QOL has gone down.  P: [phone] F: [phone]  Pt wanted to discuss taking Celebrex and Topamax. She hasn't started either of these and would like to know Dr. Gardiner Ramus opinion about taking these.

## 2020-01-27 NOTE — Progress Notes (Signed)
Virtual Visit via Telephone Note  I connected with Cheryl [name] on 02/05/20 at  1:40 PM EST by telephone and verified that I am speaking with the correct person using two identifiers.   I discussed the limitations, risks, security and privacy concerns of performing an evaluation and management service by telephone and the availability of in person appointments. I also discussed with the patient that there may be a patient responsible charge related to this service. The patient expressed understanding and agreed to proceed.  Patient location: at home.  Provider loccation: In office   Subjective:    CC: F/U Headaches.    She stated that her daughter Cheryl [name] wanted to speak to Dr. Madilyn Fireman about her headaches. She feels that she is not feeling well most days and her pain is more. She would like for her to be seen by someone who specializes in Headaches. His name is [name] he is in [place]. She would like for this to be sent as an URGENT referral and that it will take their office 2 weeks to review her medical records and she would get an appointment around [facility] February.  She states that her mother's QOL has gone down.  P: [phone] F: [phone]  Pt wanted to discuss taking Celebrex and Topamax. She hasn't started either of these and would like to know Dr. Gardiner Ramus opinion about taking these.HPI:  Would like to see a Neurologist at [facility] who specializes in headaches. Runs a headache clinic. Still having facial pain and sinus pain.  She is currently on topiramate and celebrex.  She recently got Ajovy approved.   She had also sent a myChart asking for a scan of her head for the persistent pain.   Past medical history, Surgical history, Family history not pertinant except as noted below, Social history, Allergies, and medications have been entered into the medical record, reviewed, and corrections made.   Review of Systems: No fevers, chills, night sweats, weight loss, chest pain,  or shortness of breath.   Objective:    General: Speaking clearly in complete sentences without any shortness of breath.  Alert and oriented x3.  Normal judgment. No apparent acute distress.    Impression and Recommendations:    Migraine headache Think at this point very reasonable to get a second opinion.  I think would also be reasonable to consider a trial of Topamax and she has not tried that yet.  The Ajovy does not seem to be working at this point.  Okay to use Celebrex here there but not frequently it is an NSAID and is hard on the kidneys.  That can provide some pain relief.  I do think her chronic neck pain is a huge contributor to her pain.  Her quality of life has just become severely affected.  Go ahead and move forward with MRI of the brain as well just to rule out other causes or etiology.  Chronic neck pain So discussed going ahead and moving forward with an MRI of the cervical spine as well suspect that she probably has pretty significant degenerative disease and may be even osteophytes that are causing some neuro foraminal compression.  I do think this could be a big cause of her headaches.        I discussed the assessment and treatment plan with the patient. The patient was provided an opportunity to ask questions and all were answered. The patient agreed with the plan and demonstrated an understanding of the instructions.  The patient was advised to call back or seek an in-person evaluation if the symptoms worsen or if the condition fails to improve as anticipated.  I provided 25 minutes of non-face-to-face time during this encounter.   [name], MD

## 2020-01-29 ENCOUNTER — Encounter: Payer: Self-pay | Admitting: Family Medicine

## 2020-01-29 DIAGNOSIS — Z13820 Encounter for screening for osteoporosis: Secondary | ICD-10-CM

## 2020-01-29 DIAGNOSIS — Z780 Asymptomatic menopausal state: Secondary | ICD-10-CM

## 2020-01-30 ENCOUNTER — Other Ambulatory Visit: Payer: Self-pay | Admitting: Osteopathic Medicine

## 2020-01-30 ENCOUNTER — Other Ambulatory Visit: Payer: Self-pay | Admitting: Family Medicine

## 2020-01-30 DIAGNOSIS — Z1231 Encounter for screening mammogram for malignant neoplasm of breast: Secondary | ICD-10-CM

## 2020-01-30 DIAGNOSIS — Z780 Asymptomatic menopausal state: Secondary | ICD-10-CM

## 2020-01-30 DIAGNOSIS — Z13820 Encounter for screening for osteoporosis: Secondary | ICD-10-CM

## 2020-02-05 NOTE — Assessment & Plan Note (Addendum)
Think at this point very reasonable to get a second opinion.  I think would also be reasonable to consider a trial of Topamax and she has not tried that yet.  The Ajovy does not seem to be working at this point.  Okay to use Celebrex here there but not frequently it is an NSAID and is hard on the kidneys.  That can provide some pain relief.  I do think her chronic neck pain is a huge contributor to her pain.  Her quality of life has just become severely affected.  Go ahead and move forward with MRI of the brain as well just to rule out other causes or etiology.

## 2020-02-05 NOTE — Assessment & Plan Note (Signed)
So discussed going ahead and moving forward with an MRI of the cervical spine as well suspect that she probably has pretty significant degenerative disease and may be even osteophytes that are causing some neuro foraminal compression.  I do think this could be a big cause of her headaches.

## 2020-02-11 ENCOUNTER — Telehealth: Payer: Self-pay | Admitting: *Deleted

## 2020-02-11 NOTE — Telephone Encounter (Signed)
Pt has contrast allergy and will need the pre-medication regimen sent to her pharmacy.

## 2020-02-12 NOTE — Telephone Encounter (Signed)
We have the protocol from imaging.  I do not remember it exactly.  I am happy to send it I just need a reminder on what they recommend.

## 2020-02-12 NOTE — Telephone Encounter (Signed)
Called Imaging and put the medication regimes information on your desk.

## 2020-02-17 ENCOUNTER — Ambulatory Visit (INDEPENDENT_AMBULATORY_CARE_PROVIDER_SITE_OTHER): Payer: PPO

## 2020-02-17 ENCOUNTER — Other Ambulatory Visit: Payer: Self-pay

## 2020-02-17 ENCOUNTER — Ambulatory Visit: Payer: PPO

## 2020-02-17 DIAGNOSIS — M542 Cervicalgia: Secondary | ICD-10-CM

## 2020-02-17 DIAGNOSIS — G8929 Other chronic pain: Secondary | ICD-10-CM

## 2020-02-17 DIAGNOSIS — M47812 Spondylosis without myelopathy or radiculopathy, cervical region: Secondary | ICD-10-CM | POA: Diagnosis not present

## 2020-02-17 DIAGNOSIS — M50221 Other cervical disc displacement at C4-C5 level: Secondary | ICD-10-CM | POA: Diagnosis not present

## 2020-02-17 DIAGNOSIS — M4802 Spinal stenosis, cervical region: Secondary | ICD-10-CM | POA: Diagnosis not present

## 2020-02-17 DIAGNOSIS — M50223 Other cervical disc displacement at C6-C7 level: Secondary | ICD-10-CM | POA: Diagnosis not present

## 2020-02-17 MED ORDER — PREDNISONE 50 MG PO TABS: 50.0000 mg | ORAL_TABLET | Freq: Every day | ORAL | 0 refills | Status: DC

## 2020-02-17 MED ORDER — DIPHENHYDRAMINE HCL 50 MG PO TABS: 50.0000 mg | ORAL_TABLET | Freq: Every evening | ORAL | 0 refills | Status: DC | PRN

## 2020-02-17 NOTE — Addendum Note (Signed)
Addended by: [name] D on: 02/17/2020 11:24 AM   Modules accepted: Orders

## 2020-02-17 NOTE — Telephone Encounter (Signed)
Pre med sent to pharmacy

## 2020-02-24 ENCOUNTER — Other Ambulatory Visit: Payer: PPO

## 2020-03-02 ENCOUNTER — Ambulatory Visit (INDEPENDENT_AMBULATORY_CARE_PROVIDER_SITE_OTHER): Payer: PPO

## 2020-03-02 ENCOUNTER — Other Ambulatory Visit: Payer: Self-pay

## 2020-03-02 DIAGNOSIS — G43711 Chronic migraine without aura, intractable, with status migrainosus: Secondary | ICD-10-CM

## 2020-03-02 DIAGNOSIS — I6782 Cerebral ischemia: Secondary | ICD-10-CM | POA: Diagnosis not present

## 2020-03-02 DIAGNOSIS — G319 Degenerative disease of nervous system, unspecified: Secondary | ICD-10-CM | POA: Diagnosis not present

## 2020-03-02 DIAGNOSIS — J019 Acute sinusitis, unspecified: Secondary | ICD-10-CM | POA: Diagnosis not present

## 2020-03-02 DIAGNOSIS — H748X3 Other specified disorders of middle ear and mastoid, bilateral: Secondary | ICD-10-CM | POA: Diagnosis not present

## 2020-03-02 MED ORDER — GADOBUTROL 1 MMOL/ML IV SOLN
8.3000 mL | Freq: Once | INTRAVENOUS | Status: AC | PRN
  Administered 2020-03-02: 8.3 mL via INTRAVENOUS

## 2020-03-05 ENCOUNTER — Encounter: Payer: Self-pay | Admitting: Family Medicine

## 2020-03-07 ENCOUNTER — Encounter: Payer: Self-pay | Admitting: Family Medicine

## 2020-03-12 ENCOUNTER — Other Ambulatory Visit: Payer: Self-pay | Admitting: Family Medicine

## 2020-03-13 DIAGNOSIS — H10232 Serous conjunctivitis, except viral, left eye: Secondary | ICD-10-CM | POA: Diagnosis not present

## 2020-03-13 NOTE — Telephone Encounter (Signed)
Last written 01/09/2020 #30 with 1 refill Last appt 01/27/2020

## 2020-03-19 ENCOUNTER — Encounter: Payer: Self-pay | Admitting: Family Medicine

## 2020-03-19 ENCOUNTER — Other Ambulatory Visit: Payer: Self-pay | Admitting: *Deleted

## 2020-03-19 ENCOUNTER — Ambulatory Visit: Payer: Self-pay | Admitting: *Deleted

## 2020-03-19 NOTE — Progress Notes (Signed)
Error

## 2020-03-19 NOTE — Telephone Encounter (Signed)
error 

## 2020-03-20 ENCOUNTER — Encounter: Payer: Self-pay | Admitting: *Deleted

## 2020-03-20 DIAGNOSIS — H40053 Ocular hypertension, bilateral: Secondary | ICD-10-CM | POA: Diagnosis not present

## 2020-03-20 DIAGNOSIS — H35372 Puckering of macula, left eye: Secondary | ICD-10-CM | POA: Diagnosis not present

## 2020-03-20 DIAGNOSIS — H11422 Conjunctival edema, left eye: Secondary | ICD-10-CM | POA: Diagnosis not present

## 2020-03-20 DIAGNOSIS — H5712 Ocular pain, left eye: Secondary | ICD-10-CM | POA: Diagnosis not present

## 2020-03-20 NOTE — Telephone Encounter (Signed)
Is add Celebrex to intolerance list and notify patient that we will do that.

## 2020-03-23 DIAGNOSIS — H1013 Acute atopic conjunctivitis, bilateral: Secondary | ICD-10-CM | POA: Diagnosis not present

## 2020-03-23 DIAGNOSIS — H16223 Keratoconjunctivitis sicca, not specified as Sjogren's, bilateral: Secondary | ICD-10-CM | POA: Diagnosis not present

## 2020-03-24 ENCOUNTER — Telehealth: Payer: Self-pay | Admitting: Adult Health

## 2020-03-24 NOTE — Telephone Encounter (Signed)
Pt has asked this message re: a Formulary exception for her Ajovy be disregarded.  She found her needed paperwork and there is nothing Jinny Blossom needs to do at this time.

## 2020-03-24 NOTE — Telephone Encounter (Signed)
Fremanezumab-vfrm (AJOVY) 225 MG/1.5ML SOAJ pt has called to report that she needs Megan,NP to do a Formulary exception for her Ajovy. Pt has provided phone and fax of  [phone] option 3 fax [phone]

## 2020-03-24 NOTE — Telephone Encounter (Signed)
Noted. Will disregard message.

## 2020-03-26 ENCOUNTER — Encounter: Payer: Self-pay | Admitting: Sports Medicine

## 2020-03-26 ENCOUNTER — Ambulatory Visit (INDEPENDENT_AMBULATORY_CARE_PROVIDER_SITE_OTHER): Payer: PPO | Admitting: Sports Medicine

## 2020-03-26 ENCOUNTER — Other Ambulatory Visit: Payer: Self-pay

## 2020-03-26 DIAGNOSIS — M48062 Spinal stenosis, lumbar region with neurogenic claudication: Secondary | ICD-10-CM

## 2020-03-26 MED ORDER — TRAMADOL HCL 50 MG PO TABS: 100.0000 mg | ORAL_TABLET | Freq: Every day | ORAL | 1 refills | Status: DC | PRN

## 2020-03-26 NOTE — Assessment & Plan Note (Signed)
This is a very pleasant 80 year old female, she has a history of a posterior fusion at L4-L5, degenerative disc disease at L3-L4 and symptoms consistent with spinal stenosis. She has done well with tramadol with use up to twice a day, we can continue this long-term. We can proceed with injections if tramadol becomes ineffective. She understands to see me at least once a year, virtual visits are fine.

## 2020-03-26 NOTE — Progress Notes (Signed)
° ° °  Procedures performed today:    None.  Independent interpretation of notes and tests performed by another provider:   None.  Brief History, Exam, Impression, and Recommendations:    Lumbar stenosis with neurogenic claudication This is a very pleasant 80 year old female, she has a history of a posterior fusion at L4-L5, degenerative disc disease at L3-L4 and symptoms consistent with spinal stenosis. She has done well with tramadol with use up to twice a day, we can continue this long-term. We can proceed with injections if tramadol becomes ineffective. She understands to see me at least once a year, virtual visits are fine.    ___________________________________________ Gwen Her. Dianah Field, M.D., ABFM., CAQSM. Primary Care and [place] Instructor of [employer] of [facility] of Medicine

## 2020-03-31 ENCOUNTER — Telehealth (INDEPENDENT_AMBULATORY_CARE_PROVIDER_SITE_OTHER): Payer: PPO | Admitting: Family Medicine

## 2020-03-31 ENCOUNTER — Encounter: Payer: Self-pay | Admitting: Family Medicine

## 2020-03-31 VITALS — Wt 180.0 lb

## 2020-03-31 DIAGNOSIS — H5789 Other specified disorders of eye and adnexa: Secondary | ICD-10-CM

## 2020-03-31 DIAGNOSIS — J0100 Acute maxillary sinusitis, unspecified: Secondary | ICD-10-CM | POA: Diagnosis not present

## 2020-03-31 MED ORDER — CEFDINIR 300 MG PO CAPS: 300.0000 mg | ORAL_CAPSULE | Freq: Two times a day (BID) | ORAL | 0 refills | Status: DC

## 2020-03-31 NOTE — Progress Notes (Signed)
Virtual Visit via Video Note  I connected with Margrett Rud on 03/31/20 at 11:30 AM EST by a video enabled telemedicine application and verified that I am speaking with the correct person using two identifiers.   I discussed the limitations of evaluation and management by telemedicine and the availability of in person appointments. The patient expressed understanding and agreed to proceed.  Patient location: at home  Provider location: in office  Subjective:    CC: eye irritation  HPI: Pt reports that she continues to have burning/itching in both her eyes. She has been using cool/warm compresses and using the eye drops up to 10x daily. This is still not helping.  eyelids look pussy.  Left is worse.  Saw Dr. Jerline Pain with [facility].  Then saw Dr. Jonne Ply and was dx with allergic conjunctivitis.  Itching is occ but not frequent.  Using Azelastine 0.05 drop BID.  Eyes are really dry.  Left facial cheek pain. The left eye is more bothersome than her right as well. Pain can radiate into her right eyebrow ridge. Symptoms are the same over the last month. Pressure behind the eye when bends forward.   Brain MRI  In [place]  Past medical history, Surgical history, Family history not pertinant except as noted below, Social history, Allergies, and medications have been entered into the medical record, reviewed, and corrections made.   Review of Systems: No fevers, chills, night sweats, weight loss, chest pain, or shortness of breath.   Objective:    General: Speaking clearly in complete sentences without any shortness of breath.  Alert and oriented x3.  Normal judgment. No apparent acute distress.    Impression and Recommendations:    No problem-specific Assessment & Plan notes found for this encounter.   Burning sensation of the eyes particularly the left eye.  I think she should actually contact Dr. Jerline Pain who then referred her to a specialist he felt like it was just allergies she is already  been using azelastine eyedrops and it really has not helped.  I think she should reach back out to Dr. Jerline Pain and let her know what happened and that she is not improving and not better.  Left maxillary facial pain-I suspect this could be related to an acute sinusitis since it has been persistent and it is worse when she bends forward she gets more pressure.  It is unilateral.  She does have a history of recurrent signs of sinusitis but fortunately has actually done well over the last 2 years.  MRI performed about 4 weeks ago just indicated some mild mucosal edema.  But she is not better so we will do a trial of antibiotics this point to see if she feels better.    Time spent in encounter 21 minutes  I discussed the assessment and treatment plan with the patient. The patient was provided an opportunity to ask questions and all were answered. The patient agreed with the plan and demonstrated an understanding of the instructions.   The patient was advised to call back or seek an in-person evaluation if the symptoms worsen or if the condition fails to improve as anticipated.   [name], MD

## 2020-03-31 NOTE — Progress Notes (Signed)
Pt reports that she continues to have burning/itching in both her eyes. She has been using cool/warm compresses and using the eye drops up to 10x daily. This is still not helping.

## 2020-04-06 ENCOUNTER — Encounter: Payer: Self-pay | Admitting: Family Medicine

## 2020-04-20 ENCOUNTER — Telehealth: Payer: Self-pay | Admitting: Cardiovascular Disease

## 2020-04-20 NOTE — Telephone Encounter (Signed)
Called and spoke w/pt regarding the issue at the pharmacy and they were incorrect and the med is still free

## 2020-04-20 NOTE — Telephone Encounter (Signed)
Spoke with the patient who states that [employer] helped get her Repatha covered last year. Now the price is increasing significantly. She states that last month it cost her $45 and they told her that next month it could cost her up to $300. She would like to know what she needs to do to get it covered again. Advised that I would reach out to the pharmacy team to see if they can assist her.

## 2020-04-20 NOTE — Telephone Encounter (Signed)
Pt c/o medication issue:  1. Name of Medication: Evolocumab (REPATHA SURECLICK) 750 MG/ML SOAJ . 2. How are you currently taking this medication (dosage and times per day)?   3. Are you having a reaction (difficulty breathing--STAT)?   4. What is your medication issue? Patient would like to speak to [facility] about this medication, patient stats she has some question bout the medication

## 2020-04-27 ENCOUNTER — Encounter: Payer: Self-pay | Admitting: Adult Health

## 2020-04-28 ENCOUNTER — Telehealth (INDEPENDENT_AMBULATORY_CARE_PROVIDER_SITE_OTHER): Payer: PPO | Admitting: Family Medicine

## 2020-04-28 DIAGNOSIS — R5383 Other fatigue: Secondary | ICD-10-CM

## 2020-04-28 DIAGNOSIS — H15102 Unspecified episcleritis, left eye: Secondary | ICD-10-CM

## 2020-04-28 DIAGNOSIS — H5712 Ocular pain, left eye: Secondary | ICD-10-CM | POA: Diagnosis not present

## 2020-04-28 DIAGNOSIS — H15112 Episcleritis periodica fugax, left eye: Secondary | ICD-10-CM | POA: Diagnosis not present

## 2020-04-28 NOTE — Progress Notes (Signed)
Pt was seen today by Dr. Jerline Pain. Was told to f/u with her pcp about possible inflamation.

## 2020-04-28 NOTE — Progress Notes (Signed)
Virtual Visit via Video Note  I connected with Margrett Rud on 04/28/20 at  2:00 PM EDT by a video enabled telemedicine application and verified that I am speaking with the correct person using two identifiers.   I discussed the limitations of evaluation and management by telemedicine and the availability of in person appointments. The patient expressed understanding and agreed to proceed.  Patient location: at home   Provider location: in office  Subjective:    CC: left eye redness  HPI: She is here today to follow-up for redness and pulling sensation in her left eyes she notices it still red in the mirror.  When I last saw her she was still having some concerns and we opted to go ahead and treat her for sinusitis that she was also having a lot of facial pain.  She says since treating with the antibiotic the facial pain has gotten much better but she still having the redness and pulling sensation behind the eye.  She did go back to see her regular eye doctor who she had a visit with earlier today.  They have diagnosed her with epi scleritis periodic of fugax and have recommended additional work-up to rule out inflammatory causes.  Occ feels like running a fever in the afternoon. No joint swelling.  Has been more fatigued for the last month. She feels like her facial pain is much better since treatment with the antibiotic for sinusitis.  Though she is still having "red eye" on that side.     Past medical history, Surgical history, Family history not pertinant except as noted below, Social history, Allergies, and medications have been entered into the medical record, reviewed, and corrections made.   Review of Systems: No fevers, chills, night sweats, weight loss, chest pain, or shortness of breath.   Objective:    General: Speaking clearly in complete sentences without any shortness of breath.  Alert and oriented x3.  Normal judgment. No apparent acute distress.  Sclera of the left eye  does look erythematous and inflamed there is also a little bit of mild swelling around the left eye as well.    Impression and Recommendations:    No problem-specific Assessment & Plan notes found for this encounter.  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  No joint swelling though she does have joint pain though she does have osteoarthritis.  No night sweats but she does feel like she runs a fever in the afternoons.  We will do some additional labs to rule out other autoimmune causes.  We will get those sent to a local lab closer to where she lives in [place] she can let me know.  I did review the notes from her eye doctor, Dr. Daryel Gerald.  We will get the scanned into her chart.  Fatigue-we will do some additional labs as well to rule out anemia,Autoimmune disorder, thyroid dysfunction, lupus etc.   Time spent in encounter 21 minutes  I discussed the assessment and treatment plan with the patient. The patient was provided an opportunity to ask questions and all were answered. The patient agreed with the plan and demonstrated an understanding of the instructions.   The patient was advised to call back or seek an in-person evaluation if the symptoms worsen or if the condition fails to improve as anticipated.   [name], MD

## 2020-04-30 LAB — ANA: Anti Nuclear Antibody (ANA): NEGATIVE

## 2020-04-30 LAB — THYROID PROFILE - CHCC
Free Thyroxine Index: 1.8 (ref 1.4–3.8)
T3 Uptake: 28 % (ref 22–35)
T4, Total: 6.6 ug/dL (ref 5.1–11.9)

## 2020-04-30 LAB — C-REACTIVE PROTEIN: CRP: 0.7 mg/L (ref <8.0)

## 2020-04-30 LAB — SEDIMENTATION RATE: Sed Rate: 11 mm/h (ref 0–30)

## 2020-04-30 LAB — RHEUMATOID FACTOR: Rheumatoid fact SerPl-aCnc: <14 IU/mL (ref <14)

## 2020-04-30 LAB — CYCLIC CITRUL PEPTIDE ANTIBODY, IGG: Cyclic Citrullin Peptide Ab: <16 UNITS

## 2020-05-05 ENCOUNTER — Ambulatory Visit: Payer: PPO | Admitting: Adult Health

## 2020-05-12 DIAGNOSIS — H40052 Ocular hypertension, left eye: Secondary | ICD-10-CM | POA: Diagnosis not present

## 2020-05-12 DIAGNOSIS — H5712 Ocular pain, left eye: Secondary | ICD-10-CM | POA: Diagnosis not present

## 2020-05-12 DIAGNOSIS — H15112 Episcleritis periodica fugax, left eye: Secondary | ICD-10-CM | POA: Diagnosis not present

## 2020-05-13 ENCOUNTER — Other Ambulatory Visit: Payer: Self-pay | Admitting: Family Medicine

## 2020-05-14 ENCOUNTER — Ambulatory Visit
Admission: RE | Admit: 2020-05-14 | Discharge: 2020-05-14 | Disposition: A | Discharge status: Home / Self Care | Payer: PPO | Source: Ambulatory Visit | Attending: Osteopathic Medicine | Admitting: Osteopathic Medicine

## 2020-05-14 ENCOUNTER — Other Ambulatory Visit: Payer: Self-pay

## 2020-05-14 ENCOUNTER — Other Ambulatory Visit: Payer: PPO

## 2020-05-14 DIAGNOSIS — Z1231 Encounter for screening mammogram for malignant neoplasm of breast: Secondary | ICD-10-CM

## 2020-05-14 NOTE — Telephone Encounter (Signed)
Last RX written 03/16/2020 #30 with 1 refill Last appt 04/28/2020

## 2020-05-21 ENCOUNTER — Encounter: Payer: Self-pay | Admitting: Family Medicine

## 2020-06-14 ENCOUNTER — Other Ambulatory Visit: Payer: Self-pay | Admitting: Physician Assistant

## 2020-06-16 NOTE — Telephone Encounter (Signed)
Last written 08/06/2019 #15 no refills Hasn't been seen for non sick visit since 12/2019

## 2020-07-02 ENCOUNTER — Encounter: Payer: Self-pay | Admitting: Family Medicine

## 2020-07-02 ENCOUNTER — Other Ambulatory Visit: Payer: Self-pay

## 2020-07-02 ENCOUNTER — Ambulatory Visit (INDEPENDENT_AMBULATORY_CARE_PROVIDER_SITE_OTHER): Payer: PPO | Admitting: Family Medicine

## 2020-07-02 VITALS — BP 112/53 | HR 77 | Ht 65.0 in | Wt 180.0 lb

## 2020-07-02 DIAGNOSIS — R140 Abdominal distension (gaseous): Secondary | ICD-10-CM | POA: Diagnosis not present

## 2020-07-02 DIAGNOSIS — F32A Depression, unspecified: Secondary | ICD-10-CM

## 2020-07-02 DIAGNOSIS — F419 Anxiety disorder, unspecified: Secondary | ICD-10-CM | POA: Diagnosis not present

## 2020-07-02 DIAGNOSIS — R197 Diarrhea, unspecified: Secondary | ICD-10-CM | POA: Diagnosis not present

## 2020-07-02 NOTE — Progress Notes (Signed)
Pt reports that she has been experiencing more episodes of diarrhea lately.  Her last episode was on Tuesday it woke her up and she had to change her bedding and pajamas. She also stated that she has been having more abdominal cramping. I asked if she remembered any changes to her diet she has not. She does admit to feeling more depressed lately. She would like to get a referral back to her GI

## 2020-07-02 NOTE — Assessment & Plan Note (Signed)
She is really experiencing more depressive symptoms currently than anxiety.  We discussed options.  I think we should at least do a 6-week trial of increasing the Paxil to 40 mg she says she has plenty at home and would like to just try doubling up instead of sending over new prescription.  If it is effective and helpful then we will stay with that and we will update the prescription and if not she can always go back down to 20 mg if she does not feel well on the medication or just feels like its not effective.  Plan to follow back up in 4 to 6 weeks.

## 2020-07-02 NOTE — Progress Notes (Signed)
Established Patient Office Visit  Subjective:  Patient ID: Cheryl [name], female    DOB: [birth year]  Age: 80 y.o. MRN: [id number]  CC:  Chief Complaint  Patient presents with  . Diarrhea    HPI [name] presents for   Edit           Pt reports that she has been experiencing more episodes of diarrhea lately.  Her last episode was on Tuesday it woke her up and she had to change her bedding and pajamas. She also stated that she has been having more abdominal cramping. I asked if she remembered any changes to her diet she has not. She does admit to feeling more depressed lately. She would like to get a referral back to her GI               She says she is really only had about 3 episodes in the last 3 months but got significant abdominal cramping with them which she has never really had before she did not notice any blood.  No recent changes to diet etc. she had not eaten out.  She only eats food that she prepares.  No one around her has been sick.  No fevers or chills.  She says this last episode she actually felt nauseated for several days even though the diarrhea only lasted a day.  She has a history of colon polyps and is due for her 5-year repeat this September and she is just worried that something may have changed would really like to get back in with GI sooner if possible.  He is also reports that she has had a lot more bloating than usual.  Is also just been feeling more down she has had a few more days where she is really just had a hard time getting motivated to get up and do things she just wants to sit in her recliner.  She is currently on Paxil 20 mg and says she is just a little bit nervous about going up on the medication.  She does not feel like talking to someone would be helpful.  Past Medical History:  Diagnosis Date  . Anemia   . Anxiety    d/t recent family issues  . Arthritis   . Bone spur   . Carotid stenosis, bilateral 06/06/2019  . Chronic  back pain    stenosis and OA  . Depression    takes Paxil daily  . Diverticulosis   . Dizzy    occasionally  . Encephalitis    at 9 months ago   . Fatty liver    has had 2 Hep shots and the final one is in June 18  . GERD (gastroesophageal reflux disease)    takes Omeprazole daily  . Hepatitis 2012  . History of bronchitis    20-25 yrs ago  . History of colon polyps    precancerous  . History of shingles   . Hyperlipidemia    takes Simvastatin daily  . IBS (irritable bowel syndrome)    more on side of constipation-takes Miralax daily  . Insomnia    takes Melatonin and Ambien nightly  . Internal hemorrhoids   . Joint pain   . Joint swelling   . Lung nodules    calcified   . Migraine headache    migraines-thinks coming from neck.Last one1/28/18  . Pneumonia    hx of-20 yrs ago-=-walking  . Renal cyst    left  .  Uterine cancer ([place]) 1973    Past Surgical History:  Procedure Laterality Date  . APPENDECTOMY  1966  . cataracts     bilateral  . COLONOSCOPY    . EUS  02/10/2011   Procedure: UPPER ENDOSCOPIC ULTRASOUND (EUS) RADIAL;  Surgeon: Owens Loffler, MD;  Location: [facility];  Service: Endoscopy;  Laterality: N/A;  radial linear   . LUMBAR FUSION  03/2016   L4-5, Dr. Ola Spurr.  Marland Kitchen RECTOCELE REPAIR  2011  . ROTATOR CUFF REPAIR  2009   left  . TONSILLECTOMY    . UPPER GASTROINTESTINAL ENDOSCOPY    . VAGINAL HYSTERECTOMY  1973    Family History  Problem Relation Age of Onset  . Alcohol abuse Father   . Lung cancer Father        smoker  . Kidney failure Mother        med induced  . Rheum arthritis Mother   . Depression Brother   . Heart disease Other        Paternal family   . Heart failure Daughter   . Lung cancer Son   . Crohn's disease Brother   . Alcohol abuse Brother   . HIV Son   . Malignant hyperthermia Neg Hx   . Colon cancer Neg Hx   . Esophageal cancer Neg Hx   . Rectal cancer Neg Hx   . Stomach cancer Neg Hx   . Migraines Neg Hx      Social History   Socioeconomic History  . Marital status: Divorced    Spouse name: Not on file  . Number of children: 4  . Years of education: Not on file  . Highest education level: Not on file  Occupational History  . Occupation: Retired      Fish farm manager: RETIRED  Tobacco Use  . Smoking status: Former Smoker    Quit date: 02/07/1993    Years since quitting: 27.4  . Smokeless tobacco: Never Used  Vaping Use  . Vaping Use: Never used  Substance and Sexual Activity  . Alcohol use: No  . Drug use: No  . Sexual activity: Not on file  Other Topics Concern  . Not on file  Social History Narrative   Walking daily.              Epworth Sleepiness Scale = 1 (as of 04/10/15)      07/03/2019   Lives at home alone with her dog   Right handed    Caffeine: tea occasional   Social Determinants of Health   Financial Resource Strain: Not on file  Food Insecurity: Not on file  Transportation Needs: Not on file  Physical Activity: Not on file  Stress: Not on file  Social Connections: Not on file  Intimate Partner Violence: Not on file    Outpatient Medications Prior to Visit  Medication Sig Dispense Refill  . ALPRAZolam (XANAX) 0.5 MG tablet TAKE 1 TABLET BY MOUTH AT BEDTIME AS NEEDED FOR ANXIETY 15 tablet 0  . aspirin 81 MG tablet Take 81 mg by mouth daily.    . Cholecalciferol (VITAMIN D) 1000 UNITS capsule Take 1,000 Units by mouth daily.    . Evolocumab (REPATHA SURECLICK) 409 MG/ML SOAJ Inject 140 mg into the skin every 14 (fourteen) days. 2 pen 11  . fluticasone (FLONASE) 50 MCG/ACT nasal spray INHALE 2 SPRAYS INTO EACH NOSTRIL EVERY DAY 48 mL 2  . Fremanezumab-vfrm (AJOVY) 225 MG/1.5ML SOAJ Inject 225 mg into the skin every 30 (thirty)  days. 3 pen 3  . hydrOXYzine (VISTARIL) 25 MG capsule TAKE 1 CAPSULE (25 MG TOTAL) BY MOUTH 3 (THREE) TIMES DAILY AS NEEDED FOR ITCHING. 270 capsule 1  . MAGNESIUM PO Take 1 tablet by mouth daily.     Marland Kitchen omeprazole (PRILOSEC) 20 MG capsule  TAKE 1 CAPSULE TWICE DAILY BEFORE A MEAL 180 capsule 3  . PARoxetine (PAXIL) 20 MG tablet TAKE 1 TABLET BY MOUTH EVERY DAY IN THE MORNING 90 tablet 1  . polyethylene glycol (MIRALAX / GLYCOLAX) packet Take 17 g by mouth daily.    . RESTASIS 0.05 % ophthalmic emulsion Place 1 drop into both eyes 2 (two) times daily.     . traMADol (ULTRAM) 50 MG tablet Take 2 tablets (100 mg total) by mouth daily as needed. 180 tablet 1  . zolpidem (AMBIEN) 5 MG tablet TAKE 1 TABLET (5 MG TOTAL) BY MOUTH AT BEDTIME AS NEEDED. FOR SLEEP 30 tablet 1  . loteprednol (LOTEMAX) 0.5 % ophthalmic suspension      No facility-administered medications prior to visit.    Allergies  Allergen Reactions  . Celebrex [Celecoxib] Itching, Nausea Only and Other (See Comments)    Gas, hair loss  . Fluoxetine Hcl Shortness Of Breath and Itching    REACTION: itching,SOB  . Amitriptyline     Dry mouth   . Contrast Media [Iodinated Diagnostic Agents] Itching    Multilance.   . Dexamethasone Diarrhea and Nausea And Vomiting  . Livalo [Pitavastatin] Other (See Comments)    Myalgia   . Metrizamide Itching    Multilance.   . Nitroglycerin     Nausea,dizziness,sweats  . Rosuvastatin Other (See Comments)    bodyaches and cramping  . Simvastatin Other (See Comments)    Muscle aches and HA  . Codeine Sulfate Itching    REACTION: itching  . Sulfasalazine Nausea Only    REACTION: Nausea  . Sulfonamide Derivatives Nausea Only    REACTION: Nausea    ROS Review of Systems    Objective:    Physical Exam Constitutional:      Appearance: She is well-developed.  HENT:     Head: Normocephalic and atraumatic.  Cardiovascular:     Rate and Rhythm: Normal rate and regular rhythm.     Heart sounds: Normal heart sounds.  Pulmonary:     Effort: Pulmonary effort is normal.     Breath sounds: Normal breath sounds.  Abdominal:     General: Abdomen is flat. Bowel sounds are normal. There is no distension.     Palpations:  Abdomen is soft. There is no mass.     Tenderness: There is no abdominal tenderness.  Skin:    General: Skin is warm and dry.  Neurological:     Mental Status: She is alert and oriented to person, place, and time.  Psychiatric:        Behavior: Behavior normal.     BP (!) 112/53   Pulse 77   Ht 5\' 5"  (1.651 m)   Wt 180 lb (81.6 kg)   SpO2 98%   BMI 29.95 kg/m  Wt Readings from Last 3 Encounters:  07/02/20 180 lb (81.6 kg)  03/31/20 180 lb (81.6 kg)  03/26/20 186 lb (84.4 kg)     Health Maintenance Due  Topic Date Due  . Zoster Vaccines- Shingrix (1 of 2) Never done    There are no preventive care reminders to display for this patient.  Lab Results  Component Value Date  TSH 3.08 01/01/2020   Lab Results  Component Value Date   WBC 6.9 01/01/2020   HGB 14.0 01/01/2020   HCT 41.1 01/01/2020   MCV 87.1 01/01/2020   PLT 278 01/01/2020   Lab Results  Component Value Date   NA 139 01/01/2020   K 4.6 01/01/2020   CO2 28 01/01/2020   GLUCOSE 89 01/01/2020   BUN 15 01/01/2020   CREATININE 0.92 01/01/2020   BILITOT 0.4 01/01/2020   ALKPHOS 103 08/30/2019   AST 26 01/01/2020   ALT 20 01/01/2020   PROT 6.5 01/01/2020   ALBUMIN 4.2 08/30/2019   CALCIUM 9.9 01/01/2020   ANIONGAP 9 03/07/2016   Lab Results  Component Value Date   CHOL 159 11/11/2019   Lab Results  Component Value Date   HDL 46 11/11/2019   Lab Results  Component Value Date   LDLCALC 79 11/11/2019   Lab Results  Component Value Date   TRIG 205 (H) 11/11/2019   Lab Results  Component Value Date   CHOLHDL 3.5 11/11/2019   Lab Results  Component Value Date   HGBA1C 5.8 12/17/2013      Assessment & Plan:   Problem List Items Addressed This Visit      Other   Anxiety and depression    She is really experiencing more depressive symptoms currently than anxiety.  We discussed options.  I think we should at least do a 6-week trial of increasing the Paxil to 40 mg she says she has  plenty at home and would like to just try doubling up instead of sending over new prescription.  If it is effective and helpful then we will stay with that and we will update the prescription and if not she can always go back down to 20 mg if she does not feel well on the medication or just feels like its not effective.  Plan to follow back up in 4 to 6 weeks.       Other Visit Diagnoses    Diarrhea, unspecified type    -  Primary   Relevant Orders   Ambulatory referral to Gastroenterology   Bloating         Diarrhea-it sounds episodic is definitely not persistent at this point her stools are normal so getting a culture etc. would not likely be helpful.  I think she is just more nervous about the colonoscopy coming up and we discussed trying to get back in with GI little sooner to see if they think she might benefit from having the procedure scheduled earlier rather than later.  Bloating-we discussed more common causes are trigger foods and slowed movement of the stool through the bowel.  Just encouraged her to try to keep track of her diet/food journal to see if there are certain foods that could be making this worse she does eat a lot of beans and legumes as she does not eat a lot of meat so uses those for her protein source.  Again plan to refer to GI for further work-up.  No orders of the defined types were placed in this encounter.   Follow-up: Return in about 6 weeks (around 08/13/2020) for change in mood medication .    [name], MD

## 2020-07-02 NOTE — Patient Instructions (Signed)
Increase your paxil to 2 tabs daily.

## 2020-07-05 ENCOUNTER — Encounter: Payer: Self-pay | Admitting: Family Medicine

## 2020-07-08 ENCOUNTER — Encounter: Payer: Self-pay | Admitting: Physician Assistant

## 2020-07-09 ENCOUNTER — Other Ambulatory Visit: Payer: Self-pay

## 2020-07-09 DIAGNOSIS — G43711 Chronic migraine without aura, intractable, with status migrainosus: Secondary | ICD-10-CM

## 2020-07-09 MED ORDER — AJOVY 225 MG/1.5ML ~~LOC~~ SOAJ: 225.0000 mg | SUBCUTANEOUS | 0 refills | Status: DC

## 2020-07-13 ENCOUNTER — Other Ambulatory Visit: Payer: Self-pay | Admitting: Family Medicine

## 2020-07-13 DIAGNOSIS — F419 Anxiety disorder, unspecified: Secondary | ICD-10-CM

## 2020-07-14 DIAGNOSIS — H52223 Regular astigmatism, bilateral: Secondary | ICD-10-CM | POA: Diagnosis not present

## 2020-07-14 DIAGNOSIS — H04123 Dry eye syndrome of bilateral lacrimal glands: Secondary | ICD-10-CM | POA: Diagnosis not present

## 2020-07-14 DIAGNOSIS — H5213 Myopia, bilateral: Secondary | ICD-10-CM | POA: Diagnosis not present

## 2020-07-14 DIAGNOSIS — H35372 Puckering of macula, left eye: Secondary | ICD-10-CM | POA: Diagnosis not present

## 2020-07-14 DIAGNOSIS — H5712 Ocular pain, left eye: Secondary | ICD-10-CM | POA: Diagnosis not present

## 2020-07-14 DIAGNOSIS — H15112 Episcleritis periodica fugax, left eye: Secondary | ICD-10-CM | POA: Diagnosis not present

## 2020-07-14 DIAGNOSIS — H524 Presbyopia: Secondary | ICD-10-CM | POA: Diagnosis not present

## 2020-07-23 ENCOUNTER — Other Ambulatory Visit: Payer: Self-pay | Admitting: Cardiovascular Disease

## 2020-07-27 ENCOUNTER — Telehealth: Payer: Self-pay | Admitting: Adult Health

## 2020-07-27 ENCOUNTER — Other Ambulatory Visit: Payer: Self-pay | Admitting: *Deleted

## 2020-07-27 DIAGNOSIS — G43711 Chronic migraine without aura, intractable, with status migrainosus: Secondary | ICD-10-CM

## 2020-07-27 MED ORDER — AJOVY 225 MG/1.5ML ~~LOC~~ SOAJ: 225.0000 mg | SUBCUTANEOUS | 0 refills | Status: DC

## 2020-07-27 NOTE — Telephone Encounter (Signed)
Pt called stating that she has received a letter from [facility] stating that they are needing a refill request or a new prescription for the Fremanezumab-vfrm (AJOVY) 225 MG/1.5ML SOAJ Pt gave number [phone] to be called if any questions. Pt would like to be called once this has been done

## 2020-07-27 NOTE — Telephone Encounter (Signed)
I called pt. Advised we sent in refill as requested. She verbalized understanding.

## 2020-08-06 ENCOUNTER — Ambulatory Visit: Payer: PPO | Admitting: Physician Assistant

## 2020-08-06 ENCOUNTER — Encounter: Payer: Self-pay | Admitting: Physician Assistant

## 2020-08-06 VITALS — BP 130/70 | HR 85 | Ht 64.5 in | Wt 178.0 lb

## 2020-08-06 DIAGNOSIS — K589 Irritable bowel syndrome without diarrhea: Secondary | ICD-10-CM | POA: Diagnosis not present

## 2020-08-06 DIAGNOSIS — K5909 Other constipation: Secondary | ICD-10-CM | POA: Diagnosis not present

## 2020-08-06 DIAGNOSIS — Z86010 Personal history of colonic polyps: Secondary | ICD-10-CM

## 2020-08-06 MED ORDER — ONDANSETRON HCL 4 MG PO TABS: ORAL_TABLET | ORAL | 0 refills | Status: DC

## 2020-08-06 MED ORDER — SUTAB 1479-225-188 MG PO TABS: 1.0000 | ORAL_TABLET | ORAL | 0 refills | Status: DC

## 2020-08-06 NOTE — Patient Instructions (Addendum)
If you are age 78 or older, your body mass index should be between 23-30. Your Body mass index is 30.08 kg/m. If this is out of the aforementioned range listed, please consider follow up with your Primary Care Provider. __________________________________________________________  The [facility] providers would like to encourage you to use [facility] to communicate with providers for non-urgent requests or questions.  Due to long hold times on the telephone, sending your provider a message by [facility] may be a faster and more efficient way to get a response.  Please allow 48 business hours for a response.  Please remember that this is for non-urgent requests.   You have been scheduled for a colonoscopy. Please follow written instructions given to you at your visit today.  Please pick up your prep supplies at the pharmacy within the next 1-3 days. If you use inhalers (even only as needed), please bring them with you on the day of your procedure.  We have sent Ondansetron to your pharmacy for you to use while you are doing your bowel prep. Be sure to take a tablet 30 minutes prior to starting your bowel prep.  [name], PA-C recommends that you complete a bowel purge (to clean out your bowels). Please do the following: Purchase a bottle of Miralax over the counter as well as a box of 5 mg dulcolax tablets. Take 4 dulcolax tablets. Wait 1 hour. You will then drink 6-8 capfuls of Miralax mixed in an adequate amount of water/juice/gatorade (you may choose which of these liquids to drink) over the next 2-3 hours. You should expect results within 1 to 6 hours after completing the bowel purge.  Once you have completed your bowel purge, increase your Miralax to 1 and 1/2 doses daily.  Be sure to drink 60 ounces of water daily  Follow up as needed.  Thank you for entrusting me with your care and choosing [facility].  [name], PA-C

## 2020-08-06 NOTE — Progress Notes (Signed)
Subjective:    Patient ID: Cheryl [name], female    DOB: 1941-01-26, 80 y.o.   MRN: [id number]  HPI Cheryl [name] is a pleasant 80 year old white female, established with Dr. Henrene Pastor, who comes in today to discuss changes in bowel habits primarily with constipation and follow-up colonoscopy. She last had colonoscopy in October 2017 for history of adenomatous colon polyps.  She was noted to have melanosis, she had 3 polyps removed all 2 to 4 mm in size been noted to have multiple diverticuli and internal hemorrhoids.  Path on the polyps consistent with 2 tubular adenomas and 1 hyperplastic polyp and was indicated for 5-year interval follow-up. Other medical problems include carotid stenosis, IBS, anxiety/depression and lumbar stenosis. She says today that her biggest problem is constipation and that sometimes she will go for up to a week without a bowel movement.  She has used MiraLAX and says that this works fairly well, but has not been using on a daily basis.  She has had some intermittent cramping recently, has had occasional episodes of diarrhea in the past but none recently.  She says she feels bloated and "backed up" currently.  She says that her PCP had checked abdominal films on her in the not too distant past and said that she was full of stool.  She has not noticed any bleeding.  Appetite has been good, weight has been stable.  Review of Systems. Pertinent positive and negative review of systems were noted in the above HPI section.  All other review of systems was otherwise negative.   Outpatient Encounter Medications as of 08/06/2020  Medication Sig   ALPRAZolam (XANAX) 0.5 MG tablet TAKE 1 TABLET BY MOUTH AT BEDTIME AS NEEDED FOR ANXIETY   aspirin 81 MG tablet Take 81 mg by mouth daily.   Cholecalciferol (VITAMIN D) 1000 UNITS capsule Take 1,000 Units by mouth daily.   Evolocumab (REPATHA SURECLICK) 124 MG/ML SOAJ INJECT 140 MG INTO THE SKIN EVERY 14 (FOURTEEN) DAYS.   fluticasone (FLONASE) 50  MCG/ACT nasal spray INHALE 2 SPRAYS INTO EACH NOSTRIL EVERY DAY   Fremanezumab-vfrm (AJOVY) 225 MG/1.5ML SOAJ Inject 225 mg into the skin every 30 (thirty) days.   hydrOXYzine (VISTARIL) 25 MG capsule TAKE 1 CAPSULE (25 MG TOTAL) BY MOUTH 3 (THREE) TIMES DAILY AS NEEDED FOR ITCHING.   MAGNESIUM PO Take 1 tablet by mouth daily.    omeprazole (PRILOSEC) 20 MG capsule TAKE 1 CAPSULE TWICE DAILY BEFORE A MEAL   ondansetron (ZOFRAN) 4 MG tablet Take 1 tablet every 6 hours as needed for nausea while doing bowel prep   PARoxetine (PAXIL) 20 MG tablet Take 40 mg by mouth daily.   polyethylene glycol (MIRALAX / GLYCOLAX) packet Take 17 g by mouth daily.   RESTASIS 0.05 % ophthalmic emulsion Place 1 drop into both eyes 2 (two) times daily.    Sodium Sulfate-Mag Sulfate-KCl (SUTAB) [phone] MG TABS Take 1 kit by mouth as directed.   traMADol (ULTRAM) 50 MG tablet Take 2 tablets (100 mg total) by mouth daily as needed.   zolpidem (AMBIEN) 5 MG tablet TAKE 1 TABLET AT BEDTIME AS NEEDED SLEEP   [DISCONTINUED] PARoxetine (PAXIL) 20 MG tablet TAKE 1 TABLET BY MOUTH EVERY DAY IN THE MORNING (Patient taking differently: Take 40 mg by mouth daily.)   No facility-administered encounter medications on file as of 08/06/2020.   Allergies  Allergen Reactions   Celebrex [Celecoxib] Itching, Nausea Only and Other (See Comments)    Gas, hair loss  Fluoxetine Hcl Shortness Of Breath and Itching    REACTION: itching,SOB   Amitriptyline     Dry mouth    Contrast Media [Iodinated Diagnostic Agents] Itching    Multilance.    Dexamethasone Diarrhea and Nausea And Vomiting   Livalo [Pitavastatin] Other (See Comments)    Myalgia    Metrizamide Itching    Multilance.    Nitroglycerin     Nausea,dizziness,sweats   Rosuvastatin Other (See Comments)    bodyaches and cramping   Simvastatin Other (See Comments)    Muscle aches and HA   Codeine Sulfate Itching    REACTION: itching   Sulfasalazine Nausea Only     REACTION: Nausea   Sulfonamide Derivatives Nausea Only    REACTION: Nausea   Patient Active Problem List   Diagnosis Date Noted   Chronic migraine without aura, with intractable migraine, so stated, with status migrainosus 07/03/2019   Carotid stenosis, bilateral 06/06/2019   Migraine headache 03/22/2019   Daily headache 03/22/2019   Myofascial pain 03/27/2018   Numbness of left foot 11/07/2017   Right foot pain 09/12/2017   Deviated septum 01/25/2017   ETD (Eustachian tube dysfunction), bilateral 01/25/2017   Seasonal allergic rhinitis 01/25/2017   Bilateral hearing loss 01/25/2017   S/P lumbar fusion 06/20/2016   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   Fatty liver 12/22/2015   Onychodystrophy 11/24/2015   Prepatellar bursitis of right knee 08/18/2014   Hyperlipidemia 12/17/2013   Osteoarthritis of left knee 07/05/2013   Obesity 03/10/2011   Dyspepsia 02/10/2011   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   PALPITATIONS 02/11/2010   OSTEOPENIA 11/27/2009   CONSTIPATION, CHRONIC 08/15/2008   Chronic neck pain 04/11/2008   Disorder of kidney and ureter 01/21/2008   HEMORRHOIDS, INTERNAL 01/16/2008   DIVERTICULOSIS, COLON 01/16/2008   POSTMENOPAUSAL STATUS 12/05/2007   MICROSCOPIC HEMATURIA 06/05/2007   STENOSIS, LUMBAR SPINE 12/22/2005   Anxiety and depression 11/15/2005   IRRITABLE BOWEL SYNDROME 11/15/2005   ARTHRITIS 11/15/2005   Social History   Socioeconomic History   Marital status: Divorced    Spouse name: Not on file   Number of children: 4   Years of education: Not on file   Highest education level: Not on file  Occupational History   Occupation: Retired      Fish farm manager: RETIRED  Tobacco Use   Smoking status: Former    Pack years: 0.00    Types: Cigarettes    Quit date: 02/07/1993    Years since quitting: 27.5   Smokeless tobacco: Never  Vaping Use   Vaping Use: Never used  Substance and Sexual Activity   Alcohol use: No   Drug  use: No   Sexual activity: Not on file  Other Topics Concern   Not on file  Social History Narrative   Walking daily.              Epworth Sleepiness Scale = 1 (as of 04/10/15)      07/03/2019   Lives at home alone with her dog   Right handed    Caffeine: tea occasional   Social Determinants of Health   Financial Resource Strain: Not on file  Food Insecurity: Not on file  Transportation Needs: Not on file  Physical Activity: Not on file  Stress: Not on file  Social Connections: Not on file  Intimate Partner Violence: Not on file    Cheryl [name] family history includes Alcohol abuse in her brother and father; Crohn's disease in her brother; Depression  in her brother; HIV in her son; Heart disease in an other family member; Heart failure in her daughter; Kidney failure in her mother; Lung cancer in her father and son; Rheum arthritis in her mother.      Objective:    Vitals:   08/06/20 1130  BP: 130/70  Pulse: 85    Physical Exam Well-developed well-nourished older white female in no acute distress.  Height, Weight, 178 BMI 30.0  HEENT; nontraumatic normocephalic, EOMI, PE R LA, sclera anicteric. Oropharynx; not examined today Neck; supple, no JVD Cardiovascular; regular rate and rhythm with S1-S2, no murmur rub or gallop Pulmonary; Clear bilaterally Abdomen; soft, mild tenderness in the hypogastrium nondistended, no palpable mass or hepatosplenomegaly, bowel sounds are active Rectal; not done today Skin; benign exam, no jaundice rash or appreciable lesions Extremities; no clubbing cyanosis or edema skin warm and dry Neuro/Psych; alert and oriented x4, grossly nonfocal mood and affect appropriate        Assessment & Plan:   #6 80 year old white female with chronic constipation, associated abdominal cramping, symptoms worsened over the past couple of months.  This has  been associated with bloating.  #2 diverticulosis #3 history of adenomatous and hyperplastic  colon polyps-due for follow-up colonoscopy this year  #4 lumbar stenosis 5.  Anxiety/depression 6.  History of carotid stenosis  Plan; will start with a bowel purge with a MiraLAX purge to clean bowel,, then patient will increase MiraLAX to 1-1/2 doses daily in 8 to 10 ounces of water.  We discussed drinking at least 60 ounces of water daily. Avoid artificial sweeteners and carbonates with bloating.  If she continues to have significant bloating after bowel purge consider testing for SIBO If she does not have good success with this we can consider trial of Linzess or Amitiza. Patient will be scheduled for colonoscopy with Dr. Henrene Pastor.  Procedure was discussed in detail with the patient including indications risk and benefits and she is agreeable to proceed. Zofran/4 mg every 6 hours as needed for nausea #6 no refills to use with bowel prep  Rajanae Mantia S Charlee Squibb PA-C 08/06/2020   Cc: Hali Marry, *

## 2020-08-06 NOTE — Progress Notes (Signed)
Assessment and plan noted ?

## 2020-08-07 ENCOUNTER — Telehealth: Payer: Self-pay | Admitting: Cardiovascular Disease

## 2020-08-07 NOTE — Telephone Encounter (Signed)
Pt c/o medication issue: 1. Name of Medication: Repatha  2. How are you currently taking this medication (dosage and times per day)? 2 times a month 3. Are you having a reaction (difficulty breathing--STAT)?  No  4. What is your medication issue? Patient need assistants.

## 2020-08-11 NOTE — Telephone Encounter (Signed)
Called and explained to pt that the [employer] has closed and they expressed that they would like to speak to a pharmacist regarding other options as they aren't sure they can pay for it out of pocket

## 2020-08-13 ENCOUNTER — Encounter: Payer: Self-pay | Admitting: Family Medicine

## 2020-08-13 ENCOUNTER — Other Ambulatory Visit: Payer: Self-pay

## 2020-08-13 ENCOUNTER — Ambulatory Visit (INDEPENDENT_AMBULATORY_CARE_PROVIDER_SITE_OTHER): Payer: PPO | Admitting: Family Medicine

## 2020-08-13 ENCOUNTER — Telehealth: Payer: Self-pay | Admitting: Family Medicine

## 2020-08-13 VITALS — BP 121/52 | HR 78 | Ht 65.0 in | Wt 179.3 lb

## 2020-08-13 DIAGNOSIS — J392 Other diseases of pharynx: Secondary | ICD-10-CM

## 2020-08-13 DIAGNOSIS — F32A Depression, unspecified: Secondary | ICD-10-CM | POA: Diagnosis not present

## 2020-08-13 DIAGNOSIS — F419 Anxiety disorder, unspecified: Secondary | ICD-10-CM

## 2020-08-13 DIAGNOSIS — R7309 Other abnormal glucose: Secondary | ICD-10-CM

## 2020-08-13 LAB — POCT GLYCOSYLATED HEMOGLOBIN (HGB A1C): Hemoglobin A1C: 5.7 % — AB (ref 4.0–5.6)

## 2020-08-13 MED ORDER — PAROXETINE HCL 20 MG PO TABS: 40.0000 mg | ORAL_TABLET | Freq: Every day | ORAL | 1 refills | Status: DC

## 2020-08-13 NOTE — Progress Notes (Deleted)
Pt reports that 

## 2020-08-13 NOTE — Telephone Encounter (Signed)
Hi Cheryl [name] will run out of her assistance for her Repatha in about 3 months.  She said she received a letter stating that her grant was and that there can discontinue her supply.  I was hoping you might be able to help and see if we might be able to get some patient assistance coverage.  She is intolerant to statins.

## 2020-08-13 NOTE — Assessment & Plan Note (Signed)
I actually think overall she is doing really well with the increase in the Paxil to 40 mg so we discussed staying on it at least through the summer initially she was wanting to go back down on her dose but I think it has been helpful for her.  At the end of the summer we can go back and see if we can go back down to 20 mg.  Could be causing the dry throat.

## 2020-08-13 NOTE — Progress Notes (Signed)
Established Patient Office Visit  Subjective:  Patient ID: Cheryl [name], female    DOB: [birth year]  Age: 80 y.o. MRN: [id number]  CC:  Chief Complaint  Patient presents with   MOOD    HPI Cheryl [name] presents for Mood/Anxiety.  She is struggling with her mood a little bit more the last time that I saw her so we decided to increase her paroxetine to 40 mg she had 20 mg tablets at home and so wanted to try doubling up on that medication.  She says she is been tolerating the increase well and does feel like its been helpful she has had fewer days of feeling down but still feels very anxious she says even just standing in her church the other day she kept hearing a noise and was fearful that somebody was going to shoot her.  Unfortunately locally there have been some meltable mass shootings across the nation and this has really heightened her sense of insecurity being out and about in public.  She says she rarely goes out except to maybe her grocery store in her church.  She has noticed a more dry throat over the last month and some persistent sinus drainage.  She wonders if it could be a side effect of the increase of the Paxil.  She would also like to be tested for diabetes.  We have not done an A1c on her in a long time.  She also reports that right before lunchtime she will start to feel kind of shaky and jittery she usually eats breakfast every day around 6 AM and eats lunch around 11 she is very consistent and does not delay in eating.  That is another reason she would like to be tested for diabetes to make sure she is not having hypoglycemia she wonders if some of it though could be her increase nerves and anxiety.  She was also concerned that she might be losing weight.  But her weight was actually stable over the last several weeks.  Past Medical History:  Diagnosis Date   Anemia    Anxiety    d/t recent family issues   Arthritis    Bone spur    Carotid stenosis, bilateral  06/06/2019   Chronic back pain    stenosis and OA   Depression    takes Paxil daily   Diverticulosis    Dizzy    occasionally   Encephalitis    at 9 months ago    Fatty liver    has had 2 Hep shots and the final one is in June 18   GERD (gastroesophageal reflux disease)    takes Omeprazole daily   Hepatitis 2012   History of bronchitis    20-25 yrs ago   History of colon polyps    precancerous   History of shingles    Hyperlipidemia    takes Simvastatin daily   IBS (irritable bowel syndrome)    more on side of constipation-takes Miralax daily   Insomnia    takes Melatonin and Ambien nightly   Internal hemorrhoids    Joint pain    Joint swelling    Lung nodules    calcified    Migraine headache    migraines-thinks coming from neck.Last one1/28/18   Pneumonia    hx of-20 yrs ago-=-walking   Renal cyst    left   Uterine cancer ([place]) 1973    Past Surgical History:  Procedure Laterality Date   APPENDECTOMY  1966   cataracts     bilateral   COLONOSCOPY     EUS  02/10/2011   Procedure: UPPER ENDOSCOPIC ULTRASOUND (EUS) RADIAL;  Surgeon: Owens Loffler, MD;  Location: [facility];  Service: Endoscopy;  Laterality: N/A;  radial linear    LUMBAR FUSION  03/2016   L4-5, Dr. Ola Spurr.   [place]   ROTATOR CUFF REPAIR  2009   left   TONSILLECTOMY     UPPER GASTROINTESTINAL ENDOSCOPY     VAGINAL HYSTERECTOMY  1973    Family History  Problem Relation Age of Onset   Alcohol abuse Father    Lung cancer Father        smoker   Kidney failure Mother        med induced   Rheum arthritis Mother    Depression Brother    Heart disease Other        Paternal family    Heart failure Daughter    Lung cancer Son    Crohn's disease Brother    Alcohol abuse Brother    HIV Son    Malignant hyperthermia Neg Hx    Colon cancer Neg Hx    Esophageal cancer Neg Hx    Rectal cancer Neg Hx    Stomach cancer Neg Hx    Migraines Neg Hx     Social History    Socioeconomic History   Marital status: Divorced    Spouse name: Not on file   Number of children: 4   Years of education: Not on file   Highest education level: Not on file  Occupational History   Occupation: Retired      Fish farm manager: RETIRED  Tobacco Use   Smoking status: Former    Pack years: 0.00    Types: Cigarettes    Quit date: 02/07/1993    Years since quitting: 27.5   Smokeless tobacco: Never  Vaping Use   Vaping Use: Never used  Substance and Sexual Activity   Alcohol use: No   Drug use: No   Sexual activity: Not on file  Other Topics Concern   Not on file  Social History Narrative   Walking daily.              Epworth Sleepiness Scale = 1 (as of 04/10/15)      07/03/2019   Lives at home alone with her dog   Right handed    Caffeine: tea occasional   Social Determinants of Health   Financial Resource Strain: Not on file  Food Insecurity: Not on file  Transportation Needs: Not on file  Physical Activity: Not on file  Stress: Not on file  Social Connections: Not on file  Intimate Partner Violence: Not on file    Outpatient Medications Prior to Visit  Medication Sig Dispense Refill   aspirin 81 MG tablet Take 81 mg by mouth daily.     Cholecalciferol (VITAMIN D) 1000 UNITS capsule Take 1,000 Units by mouth daily.     Evolocumab (REPATHA SURECLICK) 361 MG/ML SOAJ INJECT 140 MG INTO THE SKIN EVERY 14 (FOURTEEN) DAYS. 2 mL 11   fluticasone (FLONASE) 50 MCG/ACT nasal spray INHALE 2 SPRAYS INTO EACH NOSTRIL EVERY DAY 48 mL 2   Fremanezumab-vfrm (AJOVY) 225 MG/1.5ML SOAJ Inject 225 mg into the skin every 30 (thirty) days. 4.5 mL 0   MAGNESIUM PO Take 1 tablet by mouth daily.      omeprazole (PRILOSEC) 20 MG capsule TAKE 1 CAPSULE TWICE DAILY BEFORE  A MEAL 180 capsule 3   PARoxetine (PAXIL) 20 MG tablet Take 40 mg by mouth daily.     polyethylene glycol (MIRALAX / GLYCOLAX) packet Take 17 g by mouth daily.     RESTASIS 0.05 % ophthalmic emulsion Place 1 drop into  both eyes 2 (two) times daily.      traMADol (ULTRAM) 50 MG tablet Take 2 tablets (100 mg total) by mouth daily as needed. 180 tablet 1   zolpidem (AMBIEN) 5 MG tablet TAKE 1 TABLET AT BEDTIME AS NEEDED SLEEP 30 tablet 1   ALPRAZolam (XANAX) 0.5 MG tablet TAKE 1 TABLET BY MOUTH AT BEDTIME AS NEEDED FOR ANXIETY 15 tablet 0   hydrOXYzine (VISTARIL) 25 MG capsule TAKE 1 CAPSULE (25 MG TOTAL) BY MOUTH 3 (THREE) TIMES DAILY AS NEEDED FOR ITCHING. 270 capsule 1   ondansetron (ZOFRAN) 4 MG tablet Take 1 tablet every 6 hours as needed for nausea while doing bowel prep 6 tablet 0   Sodium Sulfate-Mag Sulfate-KCl (SUTAB) [phone] MG TABS Take 1 kit by mouth as directed. 24 tablet 0   No facility-administered medications prior to visit.    Allergies  Allergen Reactions   Celebrex [Celecoxib] Itching, Nausea Only and Other (See Comments)    Gas, hair loss   Fluoxetine Hcl Shortness Of Breath and Itching    REACTION: itching,SOB   Amitriptyline     Dry mouth    Contrast Media [Iodinated Diagnostic Agents] Itching    Multilance.    Dexamethasone Diarrhea and Nausea And Vomiting   Livalo [Pitavastatin] Other (See Comments)    Myalgia    Metrizamide Itching    Multilance.    Nitroglycerin     Nausea,dizziness,sweats   Rosuvastatin Other (See Comments)    bodyaches and cramping   Simvastatin Other (See Comments)    Muscle aches and HA   Codeine Sulfate Itching    REACTION: itching   Sulfasalazine Nausea Only    REACTION: Nausea   Sulfonamide Derivatives Nausea Only    REACTION: Nausea    ROS Review of Systems    Objective:    Physical Exam  BP (!) 121/52   Pulse 78   Ht 5' 5"  (1.651 m)   Wt 179 lb 4.8 oz (81.3 kg)   SpO2 98%   BMI 29.84 kg/m  Wt Readings from Last 3 Encounters:  08/13/20 179 lb 4.8 oz (81.3 kg)  08/06/20 178 lb (80.7 kg)  07/02/20 180 lb (81.6 kg)     There are no preventive care reminders to display for this patient.  There are no preventive care  reminders to display for this patient.  Lab Results  Component Value Date   TSH 3.08 01/01/2020   Lab Results  Component Value Date   WBC 6.9 01/01/2020   HGB 14.0 01/01/2020   HCT 41.1 01/01/2020   MCV 87.1 01/01/2020   PLT 278 01/01/2020   Lab Results  Component Value Date   NA 139 01/01/2020   K 4.6 01/01/2020   CO2 28 01/01/2020   GLUCOSE 89 01/01/2020   BUN 15 01/01/2020   CREATININE 0.92 01/01/2020   BILITOT 0.4 01/01/2020   ALKPHOS 103 08/30/2019   AST 26 01/01/2020   ALT 20 01/01/2020   PROT 6.5 01/01/2020   ALBUMIN 4.2 08/30/2019   CALCIUM 9.9 01/01/2020   ANIONGAP 9 03/07/2016   Lab Results  Component Value Date   CHOL 159 11/11/2019   Lab Results  Component Value Date   HDL 46  11/11/2019   Lab Results  Component Value Date   LDLCALC 79 11/11/2019   Lab Results  Component Value Date   TRIG 205 (H) 11/11/2019   Lab Results  Component Value Date   CHOLHDL 3.5 11/11/2019   Lab Results  Component Value Date   HGBA1C 5.7 (A) 08/13/2020      Assessment & Plan:   Problem List Items Addressed This Visit       Other   Anxiety and depression - Primary    I actually think overall she is doing really well with the increase in the Paxil to 40 mg so we discussed staying on it at least through the summer initially she was wanting to go back down on her dose but I think it has been helpful for her.  At the end of the summer we can go back and see if we can go back down to 20 mg.  Could be causing the dry throat.       Relevant Medications   PARoxetine (PAXIL) 20 MG tablet   Abnormal glucose   Relevant Orders   POCT glycosylated hemoglobin (Hb A1C) (Completed)   Other Visit Diagnoses     Dry throat       Anxiety       Relevant Medications   PARoxetine (PAXIL) 20 MG tablet      Dry throat-certainly could be secondary to the increase in Paxil just encouraged her to keep her throat moisturized and may [place] run here humidifier at night.  Dry  mouth can call some dental damage so want to be proactive with that.  Abnormal glucose-New dx. discussed that her A1c is right at 5.7.  Discussed reducing sugar and carb intake and increasing physical activity and then planning to recheck in 6 months.  Meds ordered this encounter  Medications   PARoxetine (PAXIL) 20 MG tablet    Sig: Take 2 tablets (40 mg total) by mouth daily.    Dispense:  90 tablet    Refill:  1     Follow-up: Return in about 3 months (around 11/13/2020) for Mood.    [name], MD

## 2020-08-13 NOTE — Telephone Encounter (Signed)
Pt seen in lipid clinic last year. Previously intolerant to Livalo 1, 2, and 4mg  daily, rosuvastatin 10mg  daily, simvastatin 10, 20, and 40mg  daily (myalgias).  She has [employer] insurance, Repatha should be $90/90 day supply but in the donut hole this price is likely 2-3x higher. [employer] grant currently closed and there aren't any options to lower her copay.  Lipid panel last October on Repatha showed LDL at 79, improved from 147 before starting PCSK9i therapy.  Discussed with pt that if Repatha copay is unaffordable, other options would be to start ezetimibe and/or rechallenge with lower dose of rosuvastatin 5mg  daily or pravastatin 20mg  daily. She states she just picked up a 3 month supply of Repatha and it was free, but that her grant will expire before her next fill. Advised her to have the pharmacy rerun the rx at that time and call if copay is unaffordable to discuss above medication options again. Hopefully the grant will open again before this time frame.

## 2020-08-19 NOTE — Telephone Encounter (Signed)
OK, thank you.

## 2020-08-24 ENCOUNTER — Ambulatory Visit: Payer: PPO | Admitting: Adult Health

## 2020-08-24 VITALS — BP 111/64 | HR 83 | Ht 65.0 in | Wt 179.0 lb

## 2020-08-24 DIAGNOSIS — G43709 Chronic migraine without aura, not intractable, without status migrainosus: Secondary | ICD-10-CM | POA: Diagnosis not present

## 2020-08-24 DIAGNOSIS — G4486 Cervicogenic headache: Secondary | ICD-10-CM

## 2020-08-24 NOTE — Progress Notes (Addendum)
PATIENT: Cheryl [name] DOB: [birth year]  REASON FOR VISIT: follow up HISTORY FROM: patient  HISTORY OF PRESENT ILLNESS: Today 08/24/20:  Cheryl [name] is a 80 year old female with a history of cervicogenic headaches and migraine headaches.  She returns today for follow-up.  She reports that since the beginning of the year she has had very few headaches.  She reports a mild headache last week but typically she can use her rice pack on her neck and her headache resolves within 30 minutes or so.  She states when she was taking Mobic she rarely had a headache.  But it caused hair loss so she is no longer taking this medication   10/16/19: Cheryl [name] is a 80 year old female with a history of cervicogenic headaches and migraine headaches.  She reports that overall she has been doing well.  She reports that since starting Ajovy she will go some weeks with no headaches at all.  Previously she was having daily headaches.  She does report on occasion she will wake up with headaches.  She states that the headaches are typically very mild.  She does report photophobia but denies nausea or vomiting.  She is unsure if she snores.  Denies excessive daytime fatigue.  Overall she feels that she has improved since her visit with Dr. Jaynee Eagles  HISTORY Cheryl [name] is a 80 y.o. female here as requested by [name] for migraines. I reviewed [name] PAC's notes: Patient was being seen by [facility] neurosurgery and spine for cervicogenic headache, neck pain, she was last seen September 13, 2019 for bilateral C3 C2-C5 medial branch block.  She has chronic cervical pain with cervicogenic headache as well as chronic lumbar pain previously treated with tramadol, she is tried various medications for headache management such as nortriptyline and amitriptyline, amitriptyline was initially effective but then stopped, when she was seen for follow-up April 20 she had excellent response to the medial branch block and 80 to  85% improvement in her neck pain, little pain in the neck itself unfortunately she continued to describe headache daily, headache symptoms are very limiting, making it difficult for her to be active as well as difficult for her to sleep, no new symptoms or falls.  I was also able to review Dr. Fay Records notes pertaining to her headaches: Patient does have a history of migraines, she felt like they are coming from her neck, a lot of tension and tightness in her neck and a decrease in rotation to the left, heat in the morning helps, inevitably during the day her headache starts in the last until she goes to bed, some days it is more severe than others, she tried needling which caused more pain, she is tried over-the-counter medications for the neck pain as well, headaches worsening since quarantine over a year, no visual changes, she was told she could not use medications for her migraines due to "white spots" on her brain, headaches can move around and can be on the top of the head and the side of the head or the back of the head, sometimes behind the eyes, usually more on one side.  She is tried amitriptyline as well as Topamax and multiple other medications.   She has been diagnosed with migraines for about 40 years, they slowly progressed and now they are debilitating, she can feel it coming in the back or the sides of her head most of the time in the temples, can be anytime, in the temples,  she tried bote guards, it is throbbing, phono/photophobia, nausea, movement makes it worse, no vomiting, can last for up to a week off and on but one headache can last 24 hours. Daily headaches, 15 migraine days a month that are moderately severe to severe. No aura, no weakness, no numbness or tingling or weakness, heat may help a little on the neck otherwise nothing over the counter really helps she takes excedrin migraine, no known family history, no head trauma or inciting events, encephalitis as a child. Ongoing at thie  frequency and severity and quality for years.    Reviewed notes, labs and imaging from outside physicians, which showed:     MRI brain 05/13/2017: IMPRESSION: No acute abnormality. Progressive atrophy and chronic white matter changes since 2012.   Amitriptyline, gabapentin, propranolol, paxil, tramadol, trazodone    REVIEW OF SYSTEMS: Out of a complete 14 system review of symptoms, the patient complains only of the following symptoms, and all other reviewed systems are negative.  See HPI  ALLERGIES: Allergies  Allergen Reactions   Celebrex [Celecoxib] Itching, Nausea Only and Other (See Comments)    Gas, hair loss   Fluoxetine Hcl Shortness Of Breath and Itching    REACTION: itching,SOB   Amitriptyline     Dry mouth    Contrast Media [Iodinated Diagnostic Agents] Itching    Multilance.    Dexamethasone Diarrhea and Nausea And Vomiting   Livalo [Pitavastatin] Other (See Comments)    Myalgia    Metrizamide Itching    Multilance.    Nitroglycerin     Nausea,dizziness,sweats   Rosuvastatin Other (See Comments)    bodyaches and cramping   Simvastatin Other (See Comments)    Muscle aches and HA   Codeine Sulfate Itching    REACTION: itching   Sulfasalazine Nausea Only    REACTION: Nausea   Sulfonamide Derivatives Nausea Only    REACTION: Nausea    HOME MEDICATIONS: Outpatient Medications Prior to Visit  Medication Sig Dispense Refill   aspirin 81 MG tablet Take 81 mg by mouth daily.     Cholecalciferol (VITAMIN D) 1000 UNITS capsule Take 1,000 Units by mouth daily.     Evolocumab (REPATHA SURECLICK) 166 MG/ML SOAJ INJECT 140 MG INTO THE SKIN EVERY 14 (FOURTEEN) DAYS. 2 mL 11   fluticasone (FLONASE) 50 MCG/ACT nasal spray INHALE 2 SPRAYS INTO EACH NOSTRIL EVERY DAY 48 mL 2   Fremanezumab-vfrm (AJOVY) 225 MG/1.5ML SOAJ Inject 225 mg into the skin every 30 (thirty) days. 4.5 mL 0   MAGNESIUM PO Take 1 tablet by mouth daily.      omeprazole (PRILOSEC) 20 MG capsule TAKE 1  CAPSULE TWICE DAILY BEFORE A MEAL 180 capsule 3   PARoxetine (PAXIL) 20 MG tablet Take 2 tablets (40 mg total) by mouth daily. 90 tablet 1   polyethylene glycol (MIRALAX / GLYCOLAX) packet Take 17 g by mouth daily.     RESTASIS 0.05 % ophthalmic emulsion Place 1 drop into both eyes 2 (two) times daily.      traMADol (ULTRAM) 50 MG tablet Take 2 tablets (100 mg total) by mouth daily as needed. 180 tablet 1   zolpidem (AMBIEN) 5 MG tablet TAKE 1 TABLET AT BEDTIME AS NEEDED SLEEP 30 tablet 1   No facility-administered medications prior to visit.    PAST MEDICAL HISTORY: Past Medical History:  Diagnosis Date   Anemia    Anxiety    d/t recent family issues   Arthritis    Bone spur  Carotid stenosis, bilateral 06/06/2019   Chronic back pain    stenosis and OA   Depression    takes Paxil daily   Diverticulosis    Dizzy    occasionally   Encephalitis    at 9 months ago    Fatty liver    has had 2 Hep shots and the final one is in June 18   GERD (gastroesophageal reflux disease)    takes Omeprazole daily   Hepatitis 2012   History of bronchitis    20-25 yrs ago   History of colon polyps    precancerous   History of shingles    Hyperlipidemia    takes Simvastatin daily   IBS (irritable bowel syndrome)    more on side of constipation-takes Miralax daily   Insomnia    takes Melatonin and Ambien nightly   Internal hemorrhoids    Joint pain    Joint swelling    Lung nodules    calcified    Migraine headache    migraines-thinks coming from neck.Last one1/28/18   Pneumonia    hx of-20 yrs ago-=-walking   Renal cyst    left   Uterine cancer ([place]) 1973    PAST SURGICAL HISTORY: Past Surgical History:  Procedure Laterality Date   APPENDECTOMY  1966   cataracts     bilateral   COLONOSCOPY     EUS  02/10/2011   Procedure: UPPER ENDOSCOPIC ULTRASOUND (EUS) RADIAL;  Surgeon: Owens Loffler, MD;  Location: [facility];  Service: Endoscopy;  Laterality: N/A;  radial linear     LUMBAR FUSION  03/2016   L4-5, Dr. Ola Spurr.   [place]   ROTATOR CUFF REPAIR  2009   left   TONSILLECTOMY     UPPER GASTROINTESTINAL ENDOSCOPY     VAGINAL HYSTERECTOMY  1973    FAMILY HISTORY: Family History  Problem Relation Age of Onset   Alcohol abuse Father    Lung cancer Father        smoker   Kidney failure Mother        med induced   Rheum arthritis Mother    Depression Brother    Heart disease Other        Paternal family    Heart failure Daughter    Lung cancer Son    Crohn's disease Brother    Alcohol abuse Brother    HIV Son    Malignant hyperthermia Neg Hx    Colon cancer Neg Hx    Esophageal cancer Neg Hx    Rectal cancer Neg Hx    Stomach cancer Neg Hx    Migraines Neg Hx     SOCIAL HISTORY: Social History   Socioeconomic History   Marital status: Divorced    Spouse name: Not on file   Number of children: 4   Years of education: Not on file   Highest education level: Not on file  Occupational History   Occupation: Retired      Fish farm manager: RETIRED  Tobacco Use   Smoking status: Former    Types: Cigarettes    Quit date: 02/07/1993    Years since quitting: 27.5   Smokeless tobacco: Never  Vaping Use   Vaping Use: Never used  Substance and Sexual Activity   Alcohol use: No   Drug use: No   Sexual activity: Not on file  Other Topics Concern   Not on file  Social History Narrative   Walking daily.  Epworth Sleepiness Scale = 1 (as of 04/10/15)      07/03/2019   Lives at home alone with her dog   Right handed    Caffeine: tea occasional   Social Determinants of Health   Financial Resource Strain: Not on file  Food Insecurity: Not on file  Transportation Needs: Not on file  Physical Activity: Not on file  Stress: Not on file  Social Connections: Not on file  Intimate Partner Violence: Not on file      PHYSICAL EXAM  Vitals:   08/24/20 1301  BP: 111/64  Pulse: 83  Weight: 179 lb (81.2 kg)  Height:  5\' 5"  (1.651 m)   Body mass index is 29.79 kg/m.  Generalized: Well developed, in no acute distress   Neurological examination  Mentation: Alert oriented to time, place, history taking. Follows all commands speech and language fluent Cranial nerve II-XII: Pupils were equal round reactive to light. Extraocular movements were full, visual field were full on confrontational test. Head turning and shoulder shrug  were normal and symmetric. Motor: The motor testing reveals 5 over 5 strength of all 4 extremities. Good symmetric motor tone is noted throughout.  Sensory: Sensory testing is intact to soft touch on all 4 extremities. No evidence of extinction is noted.  Coordination: Cerebellar testing reveals good finger-nose-finger and heel-to-shin bilaterally.  Gait and station: Gait is normal. Reflexes: Deep tendon reflexes are symmetric and normal bilaterally.   DIAGNOSTIC DATA (LABS, IMAGING, TESTING) - I reviewed patient records, labs, notes, testing and imaging myself where available.  Lab Results  Component Value Date   WBC 6.9 01/01/2020   HGB 14.0 01/01/2020   HCT 41.1 01/01/2020   MCV 87.1 01/01/2020   PLT 278 01/01/2020      Component Value Date/Time   NA 139 01/01/2020 1033   NA 140 08/14/2017 0824   K 4.6 01/01/2020 1033   CL 102 01/01/2020 1033   CO2 28 01/01/2020 1033   GLUCOSE 89 01/01/2020 1033   BUN 15 01/01/2020 1033   BUN 14 08/14/2017 0824   CREATININE 0.92 01/01/2020 1033   CALCIUM 9.9 01/01/2020 1033   PROT 6.5 01/01/2020 1033   PROT 6.7 08/30/2019 0836   ALBUMIN 4.2 08/30/2019 0836   AST 26 01/01/2020 1033   ALT 20 01/01/2020 1033   ALKPHOS 103 08/30/2019 0836   BILITOT 0.4 01/01/2020 1033   BILITOT 0.4 08/30/2019 0836   GFRNONAA 60 01/01/2020 1033   GFRAA 70 01/01/2020 1033   Lab Results  Component Value Date   CHOL 159 11/11/2019   HDL 46 11/11/2019   LDLCALC 79 11/11/2019   TRIG 205 (H) 11/11/2019   CHOLHDL 3.5 11/11/2019   Lab Results   Component Value Date   HGBA1C 5.7 (A) 08/13/2020   Lab Results  Component Value Date   VITAMINB12 565 12/20/2018   Lab Results  Component Value Date   TSH 3.08 01/01/2020      ASSESSMENT AND PLAN 80 y.o. year old female  has a past medical history of Anemia, Anxiety, Arthritis, Bone spur, Carotid stenosis, bilateral (06/06/2019), Chronic back pain, Depression, Diverticulosis, Dizzy, Encephalitis, Fatty liver, GERD (gastroesophageal reflux disease), Hepatitis (2012), History of bronchitis, History of colon polyps, History of shingles, Hyperlipidemia, IBS (irritable bowel syndrome), Insomnia, Internal hemorrhoids, Joint pain, Joint swelling, Lung nodules, Migraine headache, Pneumonia, Renal cyst, and Uterine cancer ([place]) (1973). here with:  1.  Migraine headaches  Continue Ajovy monthly injections Encourage the patient to keep a headache journal  Follow-up in 6 months or sooner if needed    [name], MSN, NP-C 08/24/2020, 1:08 PM [facility] [address], [place], [place] [address] [phone]   Agree with assessment and plan as stated.     Sarina Ill, MD [facility]

## 2020-08-24 NOTE — Patient Instructions (Signed)
Your Plan:  Continue ajovy     Thank you for coming to see [name] at [facility]. I hope we have been able to provide you high quality care today.  You may receive a patient satisfaction survey over the next few weeks. We would appreciate your feedback and comments so that we may continue to improve ourselves and the health of our patients.

## 2020-09-04 ENCOUNTER — Other Ambulatory Visit: Payer: Self-pay

## 2020-09-04 ENCOUNTER — Encounter: Payer: Self-pay | Admitting: Internal Medicine

## 2020-09-04 ENCOUNTER — Ambulatory Visit (AMBULATORY_SURGERY_CENTER): Payer: PPO | Admitting: Internal Medicine

## 2020-09-04 VITALS — BP 126/66 | HR 82 | Temp 97.7°F | Resp 14 | Ht 64.0 in | Wt 178.0 lb

## 2020-09-04 DIAGNOSIS — K5730 Diverticulosis of large intestine without perforation or abscess without bleeding: Secondary | ICD-10-CM | POA: Diagnosis not present

## 2020-09-04 DIAGNOSIS — K589 Irritable bowel syndrome without diarrhea: Secondary | ICD-10-CM

## 2020-09-04 DIAGNOSIS — K5520 Angiodysplasia of colon without hemorrhage: Secondary | ICD-10-CM | POA: Diagnosis not present

## 2020-09-04 DIAGNOSIS — K219 Gastro-esophageal reflux disease without esophagitis: Secondary | ICD-10-CM | POA: Diagnosis not present

## 2020-09-04 DIAGNOSIS — Z86010 Personal history of colonic polyps: Secondary | ICD-10-CM

## 2020-09-04 DIAGNOSIS — K5909 Other constipation: Secondary | ICD-10-CM | POA: Diagnosis not present

## 2020-09-04 DIAGNOSIS — M1990 Unspecified osteoarthritis, unspecified site: Secondary | ICD-10-CM | POA: Diagnosis not present

## 2020-09-04 HISTORY — PX: COLONOSCOPY: SHX174

## 2020-09-04 MED ORDER — SODIUM CHLORIDE 0.9 % IV SOLN
500.0000 mL | Freq: Once | INTRAVENOUS | Status: DC
Start: 2020-09-04 — End: 2020-09-04

## 2020-09-04 NOTE — Op Note (Signed)
[place] Patient Name: Cheryl [name] Procedure Date: 09/04/2020 2:33 PM MRN: [id number] Endoscopist: Docia Chuck. Henrene Pastor , MD Age: 80 Referring MD:  Date of Birth: 06-May-1940 Gender: Female Account #: [id number] Procedure:                Colonoscopy Indications:              High risk colon cancer surveillance: Personal                            history of multiple (3 or more) adenomas, High risk                            colon cancer surveillance: Personal history of                            sessile serrated colon polyp (less than 10 mm in                            size) with no dysplasia. Chronic constipation.                            Multiple prior examinations. Last examination 2017 Medicines:                Monitored Anesthesia Care Procedure:                Pre-Anesthesia Assessment:                           - Prior to the procedure, a History and Physical                            was performed, and patient medications and                            allergies were reviewed. The patient's tolerance of                            previous anesthesia was also reviewed. The risks                            and benefits of the procedure and the sedation                            options and risks were discussed with the patient.                            All questions were answered, and informed consent                            was obtained. Prior Anticoagulants: The patient has                            taken no previous anticoagulant or antiplatelet  agents. ASA Grade Assessment: II - A patient with                            mild systemic disease. After reviewing the risks                            and benefits, the patient was deemed in                            satisfactory condition to undergo the procedure.                           After obtaining informed consent, the colonoscope                            was passed under direct  vision. Throughout the                            procedure, the patient's blood pressure, pulse, and                            oxygen saturations were monitored continuously. The                            CF [id number] was introduced through the anus                            and advanced to the the cecum, identified by                            appendiceal orifice and ileocecal valve. The                            ileocecal valve, appendiceal orifice, and rectum                            were photographed. The quality of the bowel                            preparation was adequate to identify polyps 6 mm                            and larger in size. The colonoscopy was performed                            without difficulty. The patient tolerated the                            procedure well. The bowel preparation used was                            SUPREP/tablets via split dose instruction. Scope In: 2:46:22 PM Scope Out: 3:04:18 PM Scope Withdrawal Time: 0 hours 10 minutes 39 seconds  Total Procedure Duration: 0 hours 17 minutes 56 seconds  Findings:                 Multiple small angiodysplastic lesions without                            bleeding were found in the cecum.                           Many small and large-mouthed diverticula were found                            in the entire colon.                           The exam was otherwise without abnormality on                            direct and retroflexion views. Complications:            No immediate complications. Estimated blood loss:                            None. Estimated Blood Loss:     Estimated blood loss: none. Impression:               - Multiple non-bleeding colonic angiodysplastic                            lesions.                           - Diverticulosis in the entire examined colon.                           - The examination was otherwise normal on direct                            and  retroflexion views.                           - No specimens collected. Recommendation:           - Repeat colonoscopy is not recommended for                            surveillance.                           - Patient has a contact number available for                            emergencies. The signs and symptoms of potential                            delayed complications were discussed with the  patient. Return to normal activities tomorrow.                            Written discharge instructions were provided to the                            patient.                           - Resume previous diet.                           - Continue present medications. Docia Chuck. Henrene Pastor, MD 09/04/2020 3:11:26 PM This report has been signed electronically.

## 2020-09-04 NOTE — Patient Instructions (Signed)
   YOU HAD AN ENDOSCOPIC PROCEDURE TODAY AT [facility]:   Refer to the procedure report that was given to you for any specific questions about what was found during the examination.  If the procedure report does not answer your questions, please call your gastroenterologist to clarify.  If you requested that your care partner not be given the details of your procedure findings, then the procedure report has been included in a sealed envelope for you to review at your convenience later.  YOU SHOULD EXPECT: Some feelings of bloating in the abdomen. Passage of more gas than usual.  Walking can help get rid of the air that was put into your GI tract during the procedure and reduce the bloating. If you had a lower endoscopy (such as a colonoscopy or flexible sigmoidoscopy) you may notice spotting of blood in your stool or on the toilet paper. If you underwent a bowel prep for your procedure, you may not have a normal bowel movement for a few days.  Please Note:  You might notice some irritation and congestion in your nose or some drainage.  This is from the oxygen used during your procedure.  There is no need for concern and it should clear up in a day or so.  SYMPTOMS TO REPORT IMMEDIATELY:  Following lower endoscopy (colonoscopy or flexible sigmoidoscopy):  Excessive amounts of blood in the stool  Significant tenderness or worsening of abdominal pains  Swelling of the abdomen that is new, acute  Fever of 100F or higher   For urgent or emergent issues, a gastroenterologist can be reached at any hour by calling [phone]. Do not use MyChart messaging for urgent concerns.    DIET:  We do recommend a small meal at first, but then you may proceed to your regular diet.  Drink plenty of fluids but you should avoid alcoholic beverages for 24 hours. Try to increashe fiber in your diet, and drink plenty of water.   ACTIVITY:  You should plan to take it easy for the rest of today  and you should NOT DRIVE or use heavy machinery until tomorrow (because of the sedation medicines used during the test).    FOLLOW UP: Our staff will call the number listed on your records 48-72 hours following your procedure to check on you and address any questions or concerns that you may have regarding the information given to you following your procedure. If we do not reach you, we will leave a message.  We will attempt to reach you two times.  During this call, we will ask if you have developed any symptoms of COVID 19. If you develop any symptoms (ie: fever, flu-like symptoms, shortness of breath, cough etc.) before then, please call [phone].  If you test positive for Covid 19 in the 2 weeks post procedure, please call and report this information to [name].    If any biopsies were taken you will be contacted by phone or by letter within the next 1-3 weeks.  Please call us at [phone] if you have not heard about the biopsies in 3 weeks.    SIGNATURES/CONFIDENTIALITY: You and/or your care partner have signed paperwork which will be entered into your electronic medical record.  These signatures attest to the fact that that the information above on your After Visit Summary has been reviewed and is understood.  Full responsibility of the confidentiality of this discharge information lies with you and/or your care-partner.

## 2020-09-04 NOTE — Progress Notes (Signed)
Report to PACU, RN, vss, BBS= Clear.  

## 2020-09-04 NOTE — Progress Notes (Signed)
Pt's states no medical or surgical changes since previsit or office visit.  ° °Vitals CW °

## 2020-09-08 ENCOUNTER — Telehealth: Payer: Self-pay | Admitting: *Deleted

## 2020-09-08 NOTE — Telephone Encounter (Signed)
  Follow up Call-  Call back number 09/04/2020  Post procedure Call Back phone  # [phone]  Permission to leave phone message Yes  Some recent data might be hidden     Patient questions:  Do you have a fever, pain , or abdominal swelling? No. Pain Score  0 *  Have you tolerated food without any problems? Yes.    Have you been able to return to your normal activities? Yes.    Do you have any questions about your discharge instructions: Diet   No. Medications  No. Follow up visit  No.  Do you have questions or concerns about your Care? No.  Actions: * If pain score is 4 or above: No action needed, pain <4.  Have you developed a fever since your procedure? no  2.   Have you had an respiratory symptoms (SOB or cough) since your procedure? no  3.   Have you tested positive for COVID 19 since your procedure no  4.   Have you had any family members/close contacts diagnosed with the COVID 19 since your procedure?  no   If yes to any of these questions please route to Joylene John, RN and Joella Prince, RN no

## 2020-09-15 ENCOUNTER — Encounter: Payer: Self-pay | Admitting: Family Medicine

## 2020-09-15 ENCOUNTER — Ambulatory Visit (INDEPENDENT_AMBULATORY_CARE_PROVIDER_SITE_OTHER): Payer: PPO | Admitting: Family Medicine

## 2020-09-15 VITALS — BP 121/52 | HR 79 | Ht 65.0 in | Wt 178.0 lb

## 2020-09-15 DIAGNOSIS — R0982 Postnasal drip: Secondary | ICD-10-CM | POA: Diagnosis not present

## 2020-09-15 DIAGNOSIS — F32A Depression, unspecified: Secondary | ICD-10-CM

## 2020-09-15 DIAGNOSIS — F419 Anxiety disorder, unspecified: Secondary | ICD-10-CM | POA: Diagnosis not present

## 2020-09-15 DIAGNOSIS — G4700 Insomnia, unspecified: Secondary | ICD-10-CM

## 2020-09-15 MED ORDER — AZELASTINE HCL 0.1 % NA SOLN: 2.0000 | Freq: Two times a day (BID) | NASAL | 1 refills | Status: DC

## 2020-09-15 MED ORDER — ZOLPIDEM TARTRATE 5 MG PO TABS: ORAL_TABLET | ORAL | 0 refills | Status: DC

## 2020-09-15 NOTE — Progress Notes (Signed)
Acute Office Visit  Subjective:    Patient ID: Margrett Rud, female    DOB: April 12, 1940, 80 y.o.   MRN: [id number]  Chief Complaint  Patient presents with   Sore Throat   Cough    HPI Patient is in today for sore throat and cough. She saw PCP about a month ago and mentioned the sore throat at that time. It was thought the increase in her Paxil may have been contributing to sore/dry throat symptoms.   Patient states that for at least the past 3 to 4 months she has been having ongoing postnasal drip that has been bothersome to her.  States she had a sinus infection around that time which was improved with antibiotics however ever since postnasal drip has continued.  She reports her symptoms include rhinorrhea, postnasal drainage, sore throat, frequent throat clearing with mild cough, dry mouth, sneezing, occasional itchy watery eyes.  States she can look in the mirror and see yellowish-white postnasal drip at times.  States her symptoms are usually worse as the day progresses.  States that by the end of the day all of the drainage has made her throat feel very swollen, dry, irritated.  She she has been trying to stay very well-hydrated.  She is currently using Flonase.  She denies any fevers, dyspnea, chest pain, nausea, vomiting, diarrhea, productive cough, sinusitis symptoms, heartburn, indigestion.  Reports she is on omeprazole daily for reflux and has not seemed to have any trouble.     Past Medical History:  Diagnosis Date   Anemia    Anxiety    d/t recent family issues   Arthritis    Bone spur    Carotid stenosis, bilateral 06/06/2019   Chronic back pain    stenosis and OA   Depression    takes Paxil daily   Diverticulosis    Dizzy    occasionally   Encephalitis    at 9 months ago    Fatty liver    has had 2 Hep shots and the final one is in June 18   GERD (gastroesophageal reflux disease)    takes Omeprazole daily   Hepatitis 2012   History of bronchitis    20-25  yrs ago   History of colon polyps    precancerous   History of shingles    Hyperlipidemia    takes Simvastatin daily   IBS (irritable bowel syndrome)    more on side of constipation-takes Miralax daily   Insomnia    takes Melatonin and Ambien nightly   Internal hemorrhoids    Joint pain    Joint swelling    Lung nodules    calcified    Migraine headache    migraines-thinks coming from neck.Last one1/28/18   Pneumonia    hx of-20 yrs ago-=-walking   Renal cyst    left   Uterine cancer ([place]) 1973    Past Surgical History:  Procedure Laterality Date   APPENDECTOMY  1966   cataracts     bilateral   COLONOSCOPY  09/04/2020   EUS  02/10/2011   Procedure: UPPER ENDOSCOPIC ULTRASOUND (EUS) RADIAL;  Surgeon: Owens Loffler, MD;  Location: [facility];  Service: Endoscopy;  Laterality: N/A;  radial linear    LUMBAR FUSION  03/2016   L4-5, Dr. Ola Spurr.   RECTOCELE REPAIR  2011   ROTATOR CUFF REPAIR  2009   left   TONSILLECTOMY     UPPER GASTROINTESTINAL ENDOSCOPY     VAGINAL HYSTERECTOMY  72    Family History  Problem Relation Age of Onset   Alcohol abuse Father    Lung cancer Father        smoker   Kidney failure Mother        med induced   Rheum arthritis Mother    Depression Brother    Heart disease Other        Paternal family    Heart failure Daughter    Lung cancer Son    Crohn's disease Brother    Alcohol abuse Brother    HIV Son    Malignant hyperthermia Neg Hx    Colon cancer Neg Hx    Esophageal cancer Neg Hx    Rectal cancer Neg Hx    Stomach cancer Neg Hx    Migraines Neg Hx     Social History   Socioeconomic History   Marital status: Divorced    Spouse name: Not on file   Number of children: 4   Years of education: Not on file   Highest education level: Not on file  Occupational History   Occupation: Retired      Fish farm manager: RETIRED  Tobacco Use   Smoking status: Former    Types: Cigarettes    Quit date: 02/07/1993    Years since  quitting: 27.6   Smokeless tobacco: Never  Vaping Use   Vaping Use: Never used  Substance and Sexual Activity   Alcohol use: No   Drug use: No   Sexual activity: Not on file  Other Topics Concern   Not on file  Social History Narrative   Walking daily.              Epworth Sleepiness Scale = 1 (as of 04/10/15)      07/03/2019   Lives at home alone with her dog   Right handed    Caffeine: tea occasional   Social Determinants of Health   Financial Resource Strain: Not on file  Food Insecurity: Not on file  Transportation Needs: Not on file  Physical Activity: Not on file  Stress: Not on file  Social Connections: Not on file  Intimate Partner Violence: Not on file    Outpatient Medications Prior to Visit  Medication Sig Dispense Refill   aspirin 81 MG tablet Take 81 mg by mouth daily.     Cholecalciferol (VITAMIN D) 1000 UNITS capsule Take 1,000 Units by mouth daily.     Evolocumab (REPATHA SURECLICK) XX123456 MG/ML SOAJ INJECT 140 MG INTO THE SKIN EVERY 14 (FOURTEEN) DAYS. 2 mL 11   fluticasone (FLONASE) 50 MCG/ACT nasal spray INHALE 2 SPRAYS INTO EACH NOSTRIL EVERY DAY 48 mL 2   Fremanezumab-vfrm (AJOVY) 225 MG/1.5ML SOAJ Inject 225 mg into the skin every 30 (thirty) days. 4.5 mL 0   MAGNESIUM PO Take 1 tablet by mouth daily.      omeprazole (PRILOSEC) 20 MG capsule TAKE 1 CAPSULE TWICE DAILY BEFORE A MEAL 180 capsule 3   PARoxetine (PAXIL) 20 MG tablet Take 2 tablets (40 mg total) by mouth daily. 90 tablet 1   polyethylene glycol (MIRALAX / GLYCOLAX) packet Take 17 g by mouth daily.     RESTASIS 0.05 % ophthalmic emulsion Place 1 drop into both eyes 2 (two) times daily.      traMADol (ULTRAM) 50 MG tablet Take 2 tablets (100 mg total) by mouth daily as needed. 180 tablet 1   zolpidem (AMBIEN) 5 MG tablet TAKE 1 TABLET AT BEDTIME AS NEEDED SLEEP  30 tablet 1   No facility-administered medications prior to visit.    Allergies  Allergen Reactions   Celebrex [Celecoxib]  Itching, Nausea Only and Other (See Comments)    Gas, hair loss   Fluoxetine Hcl Shortness Of Breath and Itching    REACTION: itching,SOB   Amitriptyline     Dry mouth    Contrast Media [Iodinated Diagnostic Agents] Itching    Multilance.    Dexamethasone Diarrhea and Nausea And Vomiting   Livalo [Pitavastatin] Other (See Comments)    Myalgia    Metrizamide Itching    Multilance.    Nitroglycerin     Nausea,dizziness,sweats   Rosuvastatin Other (See Comments)    bodyaches and cramping   Simvastatin Other (See Comments)    Muscle aches and HA   Codeine Sulfate Itching    REACTION: itching   Sulfasalazine Nausea Only    REACTION: Nausea   Sulfonamide Derivatives Nausea Only    REACTION: Nausea    Review of Systems All review of systems negative except what is listed in the HPI     Objective:    Physical Exam Vitals reviewed.  Constitutional:      Appearance: She is well-developed.  HENT:     Head: Normocephalic and atraumatic.     Right Ear: Tympanic membrane normal.     Left Ear: Tympanic membrane normal.     Nose: Rhinorrhea present.     Mouth/Throat:     Mouth: Mucous membranes are moist.     Comments: Mild erythema and cobblestoning PND - reports it is very noticeable later in the day Cardiovascular:     Rate and Rhythm: Normal rate and regular rhythm.  Pulmonary:     Effort: Pulmonary effort is normal.     Breath sounds: Normal breath sounds.  Musculoskeletal:     Cervical back: Normal range of motion and neck supple.  Lymphadenopathy:     Cervical: No cervical adenopathy.  Skin:    Capillary Refill: Capillary refill takes less than 2 seconds.     Findings: No rash.  Neurological:     General: No focal deficit present.     Mental Status: She is alert and oriented to person, place, and time.  Psychiatric:        Mood and Affect: Mood normal.        Behavior: Behavior normal.    There were no vitals taken for this visit. Wt Readings from Last 3  Encounters:  09/04/20 178 lb (80.7 kg)  08/24/20 179 lb (81.2 kg)  08/13/20 179 lb 4.8 oz (81.3 kg)    Health Maintenance Due  Topic Date Due   INFLUENZA VACCINE  09/07/2020    There are no preventive care reminders to display for this patient.   Lab Results  Component Value Date   TSH 3.08 01/01/2020   Lab Results  Component Value Date   WBC 6.9 01/01/2020   HGB 14.0 01/01/2020   HCT 41.1 01/01/2020   MCV 87.1 01/01/2020   PLT 278 01/01/2020   Lab Results  Component Value Date   NA 139 01/01/2020   K 4.6 01/01/2020   CO2 28 01/01/2020   GLUCOSE 89 01/01/2020   BUN 15 01/01/2020   CREATININE 0.92 01/01/2020   BILITOT 0.4 01/01/2020   ALKPHOS 103 08/30/2019   AST 26 01/01/2020   ALT 20 01/01/2020   PROT 6.5 01/01/2020   ALBUMIN 4.2 08/30/2019   CALCIUM 9.9 01/01/2020   ANIONGAP 9 03/07/2016  Lab Results  Component Value Date   CHOL 159 11/11/2019   Lab Results  Component Value Date   HDL 46 11/11/2019   Lab Results  Component Value Date   LDLCALC 79 11/11/2019   Lab Results  Component Value Date   TRIG 205 (H) 11/11/2019   Lab Results  Component Value Date   CHOLHDL 3.5 11/11/2019   Lab Results  Component Value Date   HGBA1C 5.7 (A) 08/13/2020       Assessment & Plan:   1. Post-nasal drip Potentially allergic cause. Will go ahead and start Astelin nasal spray BID to see if this makes any difference. If not helpful, can consider adding an oral antihistamine, but this would have more side effects than nasal antihistamine. Could also consider GERD component and discussed maybe adding Pepcid at night in addition to the Prilosec in the morning - although this does seem more PND-related at this time. Encouraged her to stay well-hydrated and prevent dry mouth with rinses. Recommend giving the nasal spray a solid trial for the next few weeks before adding in the other options we discussed. Patient aware of signs/symptoms requiring further/urgent  evaluation.  - azelastine (ASTELIN) 0.1 % nasal spray; Place 2 sprays into both nostrils 2 (two) times daily. Use in each nostril as directed  Dispense: 301 mL; Refill: 1  2. Insomnia, unspecified type 3. Anxiety and depression Refilling for patient. No concerns today.  - zolpidem (AMBIEN) 5 MG tablet; TAKE 1 TABLET AT BEDTIME AS NEEDED SLEEP  Dispense: 30 tablet; Refill: 0   Follow-up as needed.   Purcell Nails Olevia Bowens, DNP, FNP-C

## 2020-09-15 NOTE — Patient Instructions (Addendum)
Adding a new nose spray: Astelin twice daily. Continue Flonase also. If unsuccessful: Can consider adding a daily antihistamine/allergy pill like Claritin, Zyrtec, Allegra, etc. but this can dry you out even more. Consider adding Pepcid (famotidine) at bedtime to see if this is a reflux component. Stay well hydrated.

## 2020-09-21 ENCOUNTER — Telehealth: Payer: Self-pay | Admitting: Lab

## 2020-09-21 NOTE — Chronic Care Management (AMB) (Signed)
  Chronic Care Management   Note  09/21/2020 Name: Ajanee Oswalt MRN: [id number] DOB: 1940-07-21  Hendricks Limes Olbrich is a 80 y.o. year old female who is a primary care patient of Hali Marry, MD. I reached out to Margrett Rud by phone today in response to a referral sent by Ms. Hendricks Limes [place] PCP, Hali Marry, MD.   Ms. Bangs was given information about Chronic Care Management services today including:  CCM service includes personalized support from designated clinical staff supervised by her physician, including individualized plan of care and coordination with other care providers 24/7 contact phone numbers for assistance for urgent and routine care needs. Service will only be billed when office clinical staff spend 20 minutes or more in a month to coordinate care. Only one practitioner may furnish and bill the service in a calendar month. The patient may stop CCM services at any time (effective at the end of the month) by phone call to the office staff.   Patient agreed to services and verbal consent obtained.   Follow up plan:   [place]

## 2020-09-22 ENCOUNTER — Encounter: Payer: Self-pay | Admitting: Family Medicine

## 2020-10-10 ENCOUNTER — Other Ambulatory Visit: Payer: Self-pay | Admitting: Family Medicine

## 2020-10-10 DIAGNOSIS — R0982 Postnasal drip: Secondary | ICD-10-CM

## 2020-10-11 ENCOUNTER — Other Ambulatory Visit: Payer: Self-pay | Admitting: Family Medicine

## 2020-10-11 DIAGNOSIS — G4700 Insomnia, unspecified: Secondary | ICD-10-CM

## 2020-10-11 DIAGNOSIS — F419 Anxiety disorder, unspecified: Secondary | ICD-10-CM

## 2020-10-11 DIAGNOSIS — F32A Depression, unspecified: Secondary | ICD-10-CM

## 2020-10-14 ENCOUNTER — Ambulatory Visit (HOSPITAL_BASED_OUTPATIENT_CLINIC_OR_DEPARTMENT_OTHER): Payer: PPO | Admitting: Cardiovascular Disease

## 2020-10-14 ENCOUNTER — Encounter (HOSPITAL_BASED_OUTPATIENT_CLINIC_OR_DEPARTMENT_OTHER): Payer: Self-pay | Admitting: Cardiovascular Disease

## 2020-10-14 ENCOUNTER — Other Ambulatory Visit: Payer: Self-pay

## 2020-10-14 VITALS — BP 110/70 | HR 74 | Ht 65.0 in | Wt 176.8 lb

## 2020-10-14 DIAGNOSIS — Z6830 Body mass index (BMI) 30.0-30.9, adult: Secondary | ICD-10-CM

## 2020-10-14 DIAGNOSIS — E6609 Other obesity due to excess calories: Secondary | ICD-10-CM | POA: Diagnosis not present

## 2020-10-14 DIAGNOSIS — I6523 Occlusion and stenosis of bilateral carotid arteries: Secondary | ICD-10-CM | POA: Diagnosis not present

## 2020-10-14 DIAGNOSIS — E7800 Pure hypercholesterolemia, unspecified: Secondary | ICD-10-CM

## 2020-10-14 DIAGNOSIS — I10 Essential (primary) hypertension: Secondary | ICD-10-CM | POA: Diagnosis not present

## 2020-10-14 MED ORDER — FISH OIL 1000 MG PO CAPS: 1000.0000 mg | ORAL_CAPSULE | Freq: Every day | ORAL | 3 refills | Status: DC

## 2020-10-14 NOTE — Patient Instructions (Signed)
Medication Instructions:  START FISH OIL 1000 MG DAILY   *If you need a refill on your cardiac medications before your next appointment, please call your pharmacy*  Lab Work: FASTING LP/CMET IN October   If you have labs (blood work) drawn today and your tests are completely normal, you will receive your results only by: [facility] (if you have MyChart) OR A paper copy in the mail If you have any lab test that is abnormal or we need to change your treatment, we will call you to review the results.  Testing/Procedures: Your physician has requested that you have a carotid duplex. This test is an ultrasound of the carotid arteries in your neck. It looks at blood flow through these arteries that supply the brain with blood. Allow one hour for this exam. There are no restrictions or special instructions. IN MAY 2023  Follow-Up: At [facility], you and your health needs are our priority.  As part of our continuing mission to provide you with exceptional heart care, we have created designated Provider Care Teams.  These Care Teams include your primary Cardiologist (physician) and Advanced Practice Providers (APPs -  Physician Assistants and Nurse Practitioners) who all work together to provide you with the care you need, when you need it.  We recommend signing up for the patient portal called "MyChart".  Sign up information is provided on this After Visit Summary.  MyChart is used to connect with patients for Virtual Visits (Telemedicine).  Patients are able to view lab/test results, encounter notes, upcoming appointments, etc.  Non-urgent messages can be sent to your provider as well.   To learn more about what you can do with MyChart, go to [email or web address].    Your next appointment:   AFTER CAROTIDS IN MAY   The format for your next appointment:   In Person  Provider:   [name], MD  PAM OR LORA FROM PREP ([facility]) PROGRAM WILL BE IN [facility]

## 2020-10-14 NOTE — Progress Notes (Signed)
Cardiology Office Note    Date:  10/14/2020   ID:  Cheryl [name], Cheryl [name] 07-May-1940, MRN [id number]  PCP:  Cheryl Marry, MD  Cardiologist:  Cheryl Latch, MD  Electrophysiologist:  None   Evaluation Performed:  Follow-Up Visit  Chief Complaint: Palpitations, shortness of breath  History of Present Illness:    Cheryl [name] is a 80 y.o. female with hyperlipidemia and mild carotid stenosis who presents for follow up.  She was first seen 04/2015 with a report of shortness of breath.  She had an echo 05/28/15 that revealed LVEF 55-60% with grade 1 diastolic dysfunction. She also had a Lexiscan Myoview 05/07/15 that revealed LVEF 60% with a small, fixed septal/anteroseptal defect that was felt to be artifact. There is no evidence of ischemia.  She has struggled with orthostatic hypotension and was instructed to wear compression stockings increase her salt intake. She underwent L4/L5 decompression surgery on 03/2016.  She then struggled to breathe due to her back brace.    Cheryl [name] reported exertional dyspnea.  She was referred for a Lexiscan Myoview 06/2019 that revealed LVEF 69% with no ischemia.  Carotid Dopplers still revealed mild stenosis bilaterally.  She wore an ambulatory monitor for heart racing that revealed some PACs and 4 beats of SVT.  She has not been experiencing any palpitations lately.  Overall she has been feeling well.  Her only complaint is aches and pains.  She is not getting much exercise.  She denies lower extremity edema, orthopnea, or PND.  Her chronic pain does limit her ability to exercise.  She has done excellent with Repatha and is disappointed that the grant for funding ran out.    Past Medical History:  Diagnosis Date   Anemia    Anxiety    d/t recent family issues   Arthritis    Bone spur    Carotid stenosis, bilateral 06/06/2019   Chronic back pain    stenosis and OA   Depression    takes Paxil daily   Diverticulosis    Dizzy    occasionally    Encephalitis    at 9 months ago    Fatty liver    has had 2 Hep shots and the final one is in June 18   GERD (gastroesophageal reflux disease)    takes Omeprazole daily   Hepatitis 2012   History of bronchitis    20-25 yrs ago   History of colon polyps    precancerous   History of shingles    Hyperlipidemia    takes Simvastatin daily   IBS (irritable bowel syndrome)    more on side of constipation-takes Miralax daily   Insomnia    takes Melatonin and Ambien nightly   Internal hemorrhoids    Joint pain    Joint swelling    Lung nodules    calcified    Migraine headache    migraines-thinks coming from neck.Last one1/28/18   Pneumonia    hx of-20 yrs ago-=-walking   Renal cyst    left   Uterine cancer (Cheryl [name]) 1973   Past Surgical History:  Procedure Laterality Date   APPENDECTOMY  1966   cataracts     bilateral   COLONOSCOPY  09/04/2020   EUS  02/10/2011   Procedure: UPPER ENDOSCOPIC ULTRASOUND (EUS) RADIAL;  Surgeon: Owens Loffler, MD;  Location: [facility];  Service: Endoscopy;  Laterality: N/A;  radial linear    LUMBAR FUSION  03/2016   L4-5, Dr. Ola Spurr.  RECTOCELE REPAIR  2011   ROTATOR CUFF REPAIR  2009   left   TONSILLECTOMY     UPPER GASTROINTESTINAL ENDOSCOPY     VAGINAL HYSTERECTOMY  1973     Current Meds  Medication Sig   aspirin 81 MG tablet Take 81 mg by mouth daily.   Azelastine HCl 137 MCG/SPRAY SOLN PLACE 2 SPRAYS INTO BOTH NOSTRILS 2 (TWO) TIMES DAILY. USE IN EACH NOSTRIL AS DIRECTED   Cholecalciferol (VITAMIN D) 1000 UNITS capsule Take 1,000 Units by mouth daily.   Evolocumab (REPATHA SURECLICK) [id number] MG/ML SOAJ INJECT 140 MG INTO THE SKIN EVERY 14 (FOURTEEN) DAYS.   fluticasone (FLONASE) 50 MCG/ACT nasal spray INHALE 2 SPRAYS INTO EACH NOSTRIL EVERY DAY   Fremanezumab-vfrm (AJOVY) 225 MG/1.5ML SOAJ Inject 225 mg into the skin every 30 (thirty) days.   MAGNESIUM PO Take 1 tablet by mouth daily.    Omega-3 Fatty Acids (FISH OIL) 1000 MG  CAPS Take 1 capsule (1,000 mg total) by mouth daily.   omeprazole (PRILOSEC) 20 MG capsule TAKE 1 CAPSULE TWICE DAILY BEFORE A MEAL   PARoxetine (PAXIL) 20 MG tablet Take 2 tablets (40 mg total) by mouth daily.   polyethylene glycol (MIRALAX / GLYCOLAX) packet Take 17 g by mouth daily.   RESTASIS 0.05 % ophthalmic emulsion Place 1 drop into both eyes 2 (two) times daily.    traMADol (ULTRAM) 50 MG tablet Take 2 tablets (100 mg total) by mouth daily as needed.   zolpidem (AMBIEN) 5 MG tablet TAKE 1 TABLET AT BEDTIME AS NEEDED SLEEP     Allergies:   Celebrex [celecoxib], Fluoxetine hcl, Amitriptyline, Contrast media [iodinated diagnostic agents], Dexamethasone, Livalo [pitavastatin], Metrizamide, Nitroglycerin, Rosuvastatin, Simvastatin, Codeine sulfate, Sulfasalazine, and Sulfonamide derivatives   Social History   Tobacco Use   Smoking status: Former    Types: Cigarettes    Quit date: 02/07/1993    Years since quitting: 27.7   Smokeless tobacco: Never  Vaping Use   Vaping Use: Never used  Substance Use Topics   Alcohol use: No   Drug use: No     Family Hx: The patient's family history includes Alcohol abuse in her brother and father; Crohn's disease in her brother; Depression in her brother; HIV in her son; Heart disease in an other family member; Heart failure in her daughter; Kidney failure in her mother; Lung cancer in her father and son; Rheum arthritis in her mother. There is no history of Malignant hyperthermia, Colon cancer, Esophageal cancer, Rectal cancer, Stomach cancer, or Migraines.  ROS:   Please see the history of present illness.     All other systems reviewed and are negative.   Prior CV studies:   The following studies were reviewed today:  Echo 05/28/15: Study Conclusions   - Left ventricle: The cavity size was normal. Wall thickness was   normal. Systolic function was normal. The estimated ejection   fraction was in the range of 55% to 60%. Wall motion was  normal;   there were no regional wall motion abnormalities. Doppler   parameters are consistent with abnormal left ventricular   relaxation (grade 1 diastolic dysfunction).   Impressions:   - Normal LV systolic function; grade 1 diastolic dysfunction; trace   MR and TR.   Lexiscan Myoview 06/14/19: Lexiscan stress: Electrically negative for ischemia Myovue with normal perfusion No ischemia or scar LVEF 69% Low risk scan   3 Day Zio Monitor 06/28/2019:   Quality: Fair.  Baseline artifact. Predominant rhythm: sinus  rhythm Average heart rate: 85 bpm Max heart rate: 164 bpm Min heart rate: 64 bpm Pauses >2.5 seconds: none   4 beats SVT PACs noted   Labs/Other Tests and Data Reviewed:    EKG:  An ECG dated 08/19/2019 was personally reviewed today and demonstrated:  Sinus rhythm.  Rate 80 bpm.  Low voltage. 10/14/20: Sinus rhythm.  Rate 74 bpm.  Low voltage   Recent Labs: 01/01/2020: ALT 20; BUN 15; Creat 0.92; Hemoglobin 14.0; Platelets 278; Potassium 4.6; Sodium 139; TSH 3.08   Recent Lipid Panel Lab Results  Component Value Date/Time   CHOL 159 11/11/2019 08:24 AM   CHOL 244 (H) 03/02/2011 12:45 AM   TRIG 205 (H) 11/11/2019 08:24 AM   TRIG 303 (H) 03/02/2011 12:45 AM   HDL 46 11/11/2019 08:24 AM   HDL 45 03/02/2011 12:45 AM   CHOLHDL 3.5 11/11/2019 08:24 AM   CHOLHDL 4.9 10/04/2018 10:37 AM   LDLCALC 79 11/11/2019 08:24 AM   LDLCALC 121 (H) 10/04/2018 10:37 AM   LDLCALC 138 (H) 03/02/2011 12:45 AM    Wt Readings from Last 3 Encounters:  10/14/20 176 lb 12.8 oz (80.2 kg)  09/15/20 178 lb (80.7 kg)  09/04/20 178 lb (80.7 kg)     Objective:    VS:  BP 110/70   Pulse 74   Ht '5\' 5"'$  (1.651 m)   Wt 176 lb 12.8 oz (80.2 kg)   BMI 29.42 kg/m  , BMI Body mass index is 29.42 kg/m. GENERAL:  Well appearing HEENT: Pupils equal round and reactive, fundi not visualized, oral mucosa unremarkable NECK:  No jugular venous distention, waveform within normal limits, carotid  upstroke brisk and symmetric, no bruits LUNGS:  Clear to auscultation bilaterally HEART:  RRR.  PMI not displaced or sustained,S1 and S2 within normal limits, no S3, no S4, no clicks, no rubs, no murmurs ABD:  Flat, positive bowel sounds normal in frequency in pitch, no bruits, no rebound, no guarding, no midline pulsatile mass, no hepatomegaly, no splenomegaly EXT:  2 plus pulses throughout, no edema, no cyanosis no clubbing SKIN:  No rashes no nodules NEURO:  Cranial nerves II through XII grossly intact, motor grossly intact throughout PSYCH:  Cognitively intact, oriented to person place and time  ASSESSMENT & PLAN:    # Shortness of breath: Stable.  No ischemia on stress test 06/2019.  No evidence of heart failure on exam or prior echo.  # Hyperlipidemia: She has struggled with several statins.  She is doing very well on Repatha but is unable to afford it.  She was getting it through a grant which is subsequently run out.  We will give her a sample today.  She will call us before the dose runs out.  Her PCP is also working with her to help her find funding.  She also has hypertriglyceridemia and struggles to eat fish.  She will start taking a fish oil tablet 1000 mg a day.  She is due for fasting lipids and a CMP in October.  # Carotid stenosis:  Mild disease in 2015 remained unchanged and her most recent study in 2021.  Will repeat carotid Dopplers 06/2021.  # Physical inactivity:  Ms. Matkowski has not been very physically active lately.  She is limited by joint pain.  We will refer her to the PREP exercise program through the [facility].  Medication Adjustments/Labs and Tests Ordered: Current medicines are reviewed at length with the patient today.  Concerns regarding medicines are outlined  above.   Tests Ordered: Orders Placed This Encounter  Procedures   Lipid panel   Comprehensive metabolic panel   Amb Referral To Provider Referral Exercise Program (P.R.E.P)   EKG 12-Lead   VAS US  CAROTID     Medication Changes: Meds ordered this encounter  Medications   Omega-3 Fatty Acids (FISH OIL) 1000 MG CAPS    Sig: Take 1 capsule (1,000 mg total) by mouth daily.    Dispense:  90 capsule    Refill:  3     Follow Up:  With Dynasti Kerman C. Oval Linsey, MD, [facility] 06/2021  Signed, Cheryl Latch, MD  10/14/2020 10:58 AM    [place]

## 2020-10-15 DIAGNOSIS — R519 Headache, unspecified: Secondary | ICD-10-CM | POA: Diagnosis not present

## 2020-10-15 DIAGNOSIS — Z6829 Body mass index (BMI) 29.0-29.9, adult: Secondary | ICD-10-CM | POA: Diagnosis not present

## 2020-10-15 DIAGNOSIS — M47812 Spondylosis without myelopathy or radiculopathy, cervical region: Secondary | ICD-10-CM | POA: Diagnosis not present

## 2020-10-16 ENCOUNTER — Telehealth: Payer: Self-pay

## 2020-10-16 NOTE — Telephone Encounter (Signed)
Call to pt reference PREP referral Explained program to pt  Confirmed interest. Has surgery planned later in Sept for neck and back. Wants to be called back for later classes in Oct/Nov

## 2020-10-17 ENCOUNTER — Ambulatory Visit (INDEPENDENT_AMBULATORY_CARE_PROVIDER_SITE_OTHER): Payer: PPO | Admitting: *Deleted

## 2020-10-17 VITALS — BP 110/70 | HR 74 | Wt 176.0 lb

## 2020-10-17 DIAGNOSIS — Z0000 Encounter for general adult medical examination without abnormal findings: Secondary | ICD-10-CM

## 2020-10-17 NOTE — Patient Instructions (Signed)
Ms. Baisden , Thank you for taking time to come for your Medicare Wellness Visit. I appreciate your ongoing commitment to your health goals. Please review the following plan we discussed and let me know if I can assist you in the future.   Screening recommendations/referrals: Colonoscopy: Due 2027 Mammogram: Due 05/2021 Bone Density: Scheduled 10/23/2020 Recommended yearly ophthalmology/optometry visit for glaucoma screening and checkup Recommended yearly dental visit for hygiene and checkup  Vaccinations: Influenza vaccine: Due  Pneumococcal vaccine: Completed  Tdap vaccine: Due  September of 2034 Shingles vaccine: Completed   Covid-19: Completed  Advanced directives: up to date  Conditions/risks identified: none - message sent to provider on sooner appointment as dicussed  Next appointment: Follow up in one year for your annual wellness visit    Preventive Care 45 Years and Older, Female Preventive care refers to lifestyle choices and visits with your health care provider that can promote health and wellness. What does preventive care include? A yearly physical exam. This is also called an annual well check. Dental exams once or twice a year. Routine eye exams. Ask your health care provider how often you should have your eyes checked. Personal lifestyle choices, including: Daily care of your teeth and gums. Regular physical activity. Eating a healthy diet. Avoiding tobacco and drug use. Limiting alcohol use. Practicing safe sex. Taking low-dose aspirin every day. Taking vitamin and mineral supplements as recommended by your health care provider. What happens during an annual well check? The services and screenings done by your health care provider during your annual well check will depend on your age, overall health, lifestyle risk factors, and family history of disease. Counseling  Your health care provider may ask you questions about your: Alcohol use. Tobacco use. Drug  use. Emotional well-being. Home and relationship well-being. Sexual activity. Eating habits. History of falls. Memory and ability to understand (cognition). Work and work Statistician. Reproductive health. Screening  You may have the following tests or measurements: Height, weight, and BMI. Blood pressure. Lipid and cholesterol levels. These may be checked every 5 years, or more frequently if you are over 43 years old. Skin check. Lung cancer screening. You may have this screening every year starting at age 25 if you have a 30-pack-year history of smoking and currently smoke or have quit within the past 15 years. Fecal occult blood test (FOBT) of the stool. You may have this test every year starting at age 68. Flexible sigmoidoscopy or colonoscopy. You may have a sigmoidoscopy every 5 years or a colonoscopy every 10 years starting at age 24. Hepatitis C blood test. Hepatitis B blood test. Sexually transmitted disease (STD) testing. Diabetes screening. This is done by checking your blood sugar (glucose) after you have not eaten for a while (fasting). You may have this done every 1-3 years. Bone density scan. This is done to screen for osteoporosis. You may have this done starting at age 70. Mammogram. This may be done every 1-2 years. Talk to your health care provider about how often you should have regular mammograms. Talk with your health care provider about your test results, treatment options, and if necessary, the need for more tests. Vaccines  Your health care provider may recommend certain vaccines, such as: Influenza vaccine. This is recommended every year. Tetanus, diphtheria, and acellular pertussis (Tdap, Td) vaccine. You may need a Td booster every 10 years. Zoster vaccine. You may need this after age 49. Pneumococcal 13-valent conjugate (PCV13) vaccine. One dose is recommended after age 66. Pneumococcal  polysaccharide (PPSV23) vaccine. One dose is recommended after age  24. Talk to your health care provider about which screenings and vaccines you need and how often you need them. This information is not intended to replace advice given to you by your health care provider. Make sure you discuss any questions you have with your health care provider. Document Released: 02/20/2015 Document Revised: 10/14/2015 Document Reviewed: 11/25/2014 Elsevier Interactive Patient Education  2017 [employer] Prevention in the Home Falls can cause injuries. They can happen to people of all ages. There are many things you can do to make your home safe and to help prevent falls. What can I do on the outside of my home? Regularly fix the edges of walkways and driveways and fix any cracks. Remove anything that might make you trip as you walk through a door, such as a raised step or threshold. Trim any bushes or trees on the path to your home. Use bright outdoor lighting. Clear any walking paths of anything that might make someone trip, such as rocks or tools. Regularly check to see if handrails are loose or broken. Make sure that both sides of any steps have handrails. Any raised decks and porches should have guardrails on the edges. Have any leaves, snow, or ice cleared regularly. Use sand or salt on walking paths during winter. Clean up any spills in your garage right away. This includes oil or grease spills. What can I do in the bathroom? Use night lights. Install grab bars by the toilet and in the tub and shower. Do not use towel bars as grab bars. Use non-skid mats or decals in the tub or shower. If you need to sit down in the shower, use a plastic, non-slip stool. Keep the floor dry. Clean up any water that spills on the floor as soon as it happens. Remove soap buildup in the tub or shower regularly. Attach bath mats securely with double-sided non-slip rug tape. Do not have throw rugs and other things on the floor that can make you trip. What can I do in the  bedroom? Use night lights. Make sure that you have a light by your bed that is easy to reach. Do not use any sheets or blankets that are too big for your bed. They should not hang down onto the floor. Have a firm chair that has side arms. You can use this for support while you get dressed. Do not have throw rugs and other things on the floor that can make you trip. What can I do in the kitchen? Clean up any spills right away. Avoid walking on wet floors. Keep items that you use a lot in easy-to-reach places. If you need to reach something above you, use a strong step stool that has a grab bar. Keep electrical cords out of the way. Do not use floor polish or wax that makes floors slippery. If you must use wax, use non-skid floor wax. Do not have throw rugs and other things on the floor that can make you trip. What can I do with my stairs? Do not leave any items on the stairs. Make sure that there are handrails on both sides of the stairs and use them. Fix handrails that are broken or loose. Make sure that handrails are as long as the stairways. Check any carpeting to make sure that it is firmly attached to the stairs. Fix any carpet that is loose or worn. Avoid having throw rugs at the top  or bottom of the stairs. If you do have throw rugs, attach them to the floor with carpet tape. Make sure that you have a light switch at the top of the stairs and the bottom of the stairs. If you do not have them, ask someone to add them for you. What else can I do to help prevent falls? Wear shoes that: Do not have high heels. Have rubber bottoms. Are comfortable and fit you well. Are closed at the toe. Do not wear sandals. If you use a stepladder: Make sure that it is fully opened. Do not climb a closed stepladder. Make sure that both sides of the stepladder are locked into place. Ask someone to hold it for you, if possible. Clearly mark and make sure that you can see: Any grab bars or  handrails. First and last steps. Where the edge of each step is. Use tools that help you move around (mobility aids) if they are needed. These include: Canes. Walkers. Scooters. Crutches. Turn on the lights when you go into a dark area. Replace any light bulbs as soon as they burn out. Set up your furniture so you have a clear path. Avoid moving your furniture around. If any of your floors are uneven, fix them. If there are any pets around you, be aware of where they are. Review your medicines with your doctor. Some medicines can make you feel dizzy. This can increase your chance of falling. Ask your doctor what other things that you can do to help prevent falls. This information is not intended to replace advice given to you by your health care provider. Make sure you discuss any questions you have with your health care provider. Document Released: 11/20/2008 Document Revised: 07/02/2015 Document Reviewed: 02/28/2014 Elsevier Interactive Patient Education  2017 [employer].

## 2020-10-17 NOTE — Progress Notes (Signed)
Subjective:   Cheryl [name] is a 80 y.o. female who presents for Medicare Annual (Subsequent) preventive examination.  Virtual Visit via Telephone Note  I connected with  Cheryl [name] on 10/17/20 at 10:00 AM EDT by telephone and verified that I am speaking with the correct person using two identifiers.  Location: Patient: home Provider: office  Persons participating in the virtual visit: patient/Nurse Health Advisor   I discussed the limitations, risks, security and privacy concerns of performing an evaluation and management service by telephone and the availability of in person appointments. The patient expressed understanding and agreed to proceed.   We continued and completed visit with audio only.  Some vital signs may be absent or patient reported.   Julian Hy T, CMA  Cardiac Risk Factors include: advanced age (>17mn, >>72women);Other (see comment), Risk factor comments: carotid stenosis - sees Dr RAlean Rinne    Objective:    Today's Vitals   10/17/20 0938  BP: 110/70  Pulse: 74  Weight: 176 lb (79.8 kg)  PainSc: 4    Weight 176lbs BP 110/70 HR 74  Advanced Directives 10/17/2020 01/01/2019 08/24/2018 12/26/2017 12/22/2016 03/07/2016 10/21/2015  Does Patient Have a Medical Advance Directive? Yes Yes Yes Yes - Yes Yes  Type of [place] Out of facility DNR (pink MOST or yellow form) - Living will;Healthcare Power of [place]  Does patient want to make changes to medical advance directive? - No - Guardian declined No - Patient declined - - - -  Copy of [place]? Yes - validated most recent copy scanned in chart (See row information) No - copy requested - - - Yes No - copy requested  Would patient like information on creating a medical advance directive? - - - - No - Patient  declined - -  Pre-existing out of facility DNR order (yellow form or pink MOST form) - - - Yellow form placed in chart (order not valid for inpatient use) - - -    Current Medications (verified) Outpatient Encounter Medications as of 10/17/2020  Medication Sig   aspirin 81 MG tablet Take 81 mg by mouth daily.   Azelastine HCl 137 MCG/SPRAY SOLN PLACE 2 SPRAYS INTO BOTH NOSTRILS 2 (TWO) TIMES DAILY. USE IN EACH NOSTRIL AS DIRECTED   Cholecalciferol (VITAMIN D) 1000 UNITS capsule Take 1,000 Units by mouth daily.   Evolocumab (REPATHA SURECLICK) [id number]/ML SOAJ INJECT 140 MG INTO THE SKIN EVERY 14 (FOURTEEN) DAYS.   fluticasone (FLONASE) 50 MCG/ACT nasal spray INHALE 2 SPRAYS INTO EACH NOSTRIL EVERY DAY   Fremanezumab-vfrm (AJOVY) 225 MG/1.5ML SOAJ Inject 225 mg into the skin every 30 (thirty) days.   MAGNESIUM PO Take 1 tablet by mouth daily.    Omega-3 Fatty Acids (FISH OIL) 1000 MG CAPS Take 1 capsule (1,000 mg total) by mouth daily. (Patient taking differently: Take 2,000 mg by mouth daily.)   omeprazole (PRILOSEC) 20 MG capsule TAKE 1 CAPSULE TWICE DAILY BEFORE A MEAL   PARoxetine (PAXIL) 20 MG tablet Take 2 tablets (40 mg total) by mouth daily.   polyethylene glycol (MIRALAX / GLYCOLAX) packet Take 17 g by mouth daily.   RESTASIS 0.05 % ophthalmic emulsion Place 1 drop into both eyes 2 (two) times daily.    traMADol (ULTRAM) 50 MG tablet Take 2 tablets (100 mg total) by mouth daily as needed.  zolpidem (AMBIEN) 5 MG tablet TAKE 1 TABLET AT BEDTIME AS NEEDED SLEEP   No facility-administered encounter medications on file as of 10/17/2020.    Allergies (verified) Celebrex [celecoxib], Fluoxetine hcl, Amitriptyline, Contrast media [iodinated diagnostic agents], Dexamethasone, Livalo [pitavastatin], Metrizamide, Nitroglycerin, Rosuvastatin, Simvastatin, Codeine sulfate, Sulfasalazine, and Sulfonamide derivatives   History: Past Medical History:  Diagnosis Date   Anemia    Anxiety    d/t  recent family issues   Arthritis    Bone spur    Carotid stenosis, bilateral 06/06/2019   Chronic back pain    stenosis and OA   Depression    takes Paxil daily   Diverticulosis    Dizzy    occasionally   Encephalitis    at 9 months ago    Fatty liver    has had 2 Hep shots and the final one is in June 18   GERD (gastroesophageal reflux disease)    takes Omeprazole daily   Hepatitis 2012   History of bronchitis    20-25 yrs ago   History of colon polyps    precancerous   History of shingles    Hyperlipidemia    takes Simvastatin daily   IBS (irritable bowel syndrome)    more on side of constipation-takes Miralax daily   Insomnia    takes Melatonin and Ambien nightly   Internal hemorrhoids    Joint pain    Joint swelling    Lung nodules    calcified    Migraine headache    migraines-thinks coming from neck.Last one1/28/18   Pneumonia    hx of-20 yrs ago-=-walking   Renal cyst    left   Uterine cancer ([facility]) 1973   Past Surgical History:  Procedure Laterality Date   APPENDECTOMY  1966   cataracts     bilateral   COLONOSCOPY  09/04/2020   EUS  02/10/2011   Procedure: UPPER ENDOSCOPIC ULTRASOUND (EUS) RADIAL;  Surgeon: Owens Loffler, MD;  Location: [facility];  Service: Endoscopy;  Laterality: N/A;  radial linear    LUMBAR FUSION  03/2016   L4-5, Dr. Ola Spurr.   [place]   ROTATOR CUFF REPAIR  2009   left   TONSILLECTOMY     UPPER GASTROINTESTINAL ENDOSCOPY     VAGINAL HYSTERECTOMY  1973   Family History  Problem Relation Age of Onset   Kidney disease Mother    Kidney failure Mother        med induced   Rheum arthritis Mother    Heart disease Father    Alcohol abuse Father    Lung cancer Father        smoker   Depression Brother    Crohn's disease Brother    Alcohol abuse Brother    Heart failure Daughter    Lung cancer Son    HIV Son    Heart disease Other        Paternal family    Malignant hyperthermia Neg Hx    Colon cancer  Neg Hx    Esophageal cancer Neg Hx    Rectal cancer Neg Hx    Stomach cancer Neg Hx    Migraines Neg Hx    Social History   Socioeconomic History   Marital status: Divorced    Spouse name: Not on file   Number of children: 5   Years of education: Not on file   Highest education level: Not on file  Occupational History   Occupation: Retired  Employer: RETIRED  Tobacco Use   Smoking status: Former    Types: Cigarettes    Quit date: 02/07/1993    Years since quitting: 27.7   Smokeless tobacco: Never  Vaping Use   Vaping Use: Never used  Substance and Sexual Activity   Alcohol use: No   Drug use: No   Sexual activity: Not on file  Other Topics Concern   Not on file  Social History Narrative   10/17/2020      Lives alone with yorky dog      Has trouble walking right now (hip pain that she has spoken with pcp)       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                 Epworth Sleepiness Scale = 1 (as of 04/10/15)      07/03/2019   Lives at home alone with her dog   Right handed    Caffeine: tea occasional   Social Determinants of Health   Financial Resource Strain: Not on file  Food Insecurity: Not on file  Transportation Needs: Not on file  Physical Activity: Not on file  Stress: Not on file  Social Connections: Not on file    Tobacco Counseling Not applicable    Clinical Intake:  Pre-visit preparation completed: Yes  Pain : 0-10 Pain Score: 4  Pain Location: Other (Comment) (back shoulders and hip ache/painful) Pain Descriptors / Indicators: Aching, Constant Pain Onset: 1 to 4 weeks ago Pain Relieving Factors: alternating cold and hot compresses/tramadol helps some Effect of Pain on Daily Activities: yes mostly hip bothers her to stand up for a long period of time - affects her balance  Pain Relieving Factors: alternating cold and hot compresses/tramadol helps some  BMI - recorded:  (weight 176lbs) Nutritional Risks:  None Diabetes: No  How often do you need to have someone help you when you read instructions, pamphlets, or other written materials from your doctor or pharmacy?: 1 - Never What is the last grade level you completed in school?: graduated high school  Diabetic? No   Interpreter Needed?: No  Information entered by :: Eliud Polo, CMA   Activities of Daily Living In your present state of health, do you have any difficulty performing the following activities: 10/17/2020  Hearing? N  Comment has hearing aid sees ENT yearly  Vision? Y  Comment sees Eye doctor yearly has appt in December 2022  Difficulty concentrating or making decisions? Y  Comment has trouble remembering  Walking or climbing stairs? Y  Comment hip/back pain  Dressing or bathing? N  Doing errands, shopping? N  Preparing Food and eating ? N  Using the Toilet? N  In the past six months, have you accidently leaked urine? N  Do you have problems with loss of bowel control? N  Managing your Medications? N  Managing your Finances? N  Housekeeping or managing your Housekeeping? N  Some recent data might be hidden    Patient Care Team: Hali Marry, MD as PCP - General [name], MD as PCP - Cardiology (Cardiology) Irene Shipper, MD as Attending Physician (Gastroenterology) Clydell Hakim, MD (Inactive) as Attending Physician (Pain Medicine) Calvert Cantor, MD as Consulting Physician (Ophthalmology) Darius Bump, [facility] as Pharmacist (Pharmacist)  Indicate any recent Medical Services you may have received from other than [facility] providers in the past year (date may be approximate).  Assessment:   This is a routine wellness examination for Cheryl [name].  Hearing/Vision screen Hearing Screening - Comments:: Has appointment Dr Wilburn Cornelia upcoming  Vision Screening - Comments:: Has appointment in December with Dr Jerline Pain   Dietary issues and exercise activities discussed: Current Exercise Habits: The patient  does not participate in regular exercise at present, Exercise limited by: Other - see comments (hip/back pain)   Goals Addressed               This Visit's Progress     Client/Caregiver will verbalize understanding of instructions related to self-care and safety (pt-stated)        Pt wants to work on loving herself more - eats well balanced diet and drinks plenty of water        Depression Screen PHQ 2/9 Scores 10/17/2020 07/02/2020 01/01/2020 11/19/2019 03/22/2019 12/31/2018 06/12/2018  PHQ - 2 Score 0 2 0 2 2 - 0  PHQ- 9 Score - '5 6 12 9 '$ - -  Exception Documentation - - - - - Other- indicate reason in comment box -  Not completed - - - - - everyone is depressed and anxious at this time w/COVID -    Fall Risk Fall Risk  10/17/2020 07/02/2020 01/01/2020 07/03/2019 03/22/2019  Falls in the past year? 0 0 0 1 0  Number falls in past yr: - 0 - 1 0  Comment - - - pt unsure if this was all in the last year -  Injury with Fall? - 0 - 0 0  Risk for fall due to : Impaired balance/gait;Other (Comment) No Fall Risks No Fall Risks - No Fall Risks  Risk for fall due to: Comment hip pain affects balance and walking - - - -  Follow up Falls evaluation completed;Falls prevention discussed Falls prevention discussed;Falls evaluation completed - Falls prevention discussed -    FALL RISK PREVENTION PERTAINING TO THE HOME:  Any stairs in or around the home? Yes  If so, are there any without handrails? No  Home free of loose throw rugs in walkways, pet beds, electrical cords, etc? No  Adequate lighting in your home to reduce risk of falls? Yes   ASSISTIVE DEVICES UTILIZED TO PREVENT FALLS:  Life alert? No  Use of a cane, walker or w/c? No  Grab bars in the bathroom? No  Shower chair or bench in shower? No  Elevated toilet seat or a handicapped toilet? No   TIMED UP AND GO:  Phone appt  Cognitive Function:     6CIT Screen 10/17/2020 12/31/2018 12/22/2016 12/22/2015  What Year? 0 points 0  points 0 points 0 points  What month? 0 points 0 points 0 points 0 points  What time? 0 points 0 points 0 points 0 points  Count back from 20 0 points 0 points 0 points 0 points  Months in reverse - 0 points 0 points 0 points  Repeat phrase 0 points 2 points 2 points 0 points  Total Score - 2 2 0    Immunizations Immunization History  Administered Date(s) Administered   Fluad Quad(high Dose 65+) 10/04/2018, 11/19/2019   Hep A / Hep B 12/22/2015, 01/19/2016, 06/20/2016   Influenza Split 12/20/2011   Influenza Whole 11/08/2004, 11/08/2005, 11/24/2006, 11/12/2007, 12/15/2008, 11/27/2009   Influenza, High Dose Seasonal PF 09/26/2016   Influenza,inj,Quad PF,6+ Mos 10/10/2012, 10/01/2013, 12/31/2014, 10/05/2015, 09/21/2017   Influenza-Unspecified 09/21/2017   PFIZER Comirnaty(Gray Top)Covid-19 Tri-Sucrose Vaccine 06/25/2020   PFIZER(Purple Top)SARS-COV-2 Vaccination 03/01/2019, 03/22/2019, 12/21/2019, 06/25/2020  Pneumococcal Conjugate-13 12/17/2013   Pneumococcal Polysaccharide-23 02/07/2005, 12/15/2010   Td 02/07/2002   Tdap 10/10/2012   Zoster Recombinat (Shingrix) 01/24/2020, 05/15/2020   Zoster, Live 11/27/2009    TDAP status: Up to date  Flu Vaccine status: Due, Education has been provided regarding the importance of this vaccine. Advised may receive this vaccine at local pharmacy or Health Dept. Aware to provide a copy of the vaccination record if obtained from local pharmacy or Health Dept. Verbalized acceptance and understanding.  Pneumococcal vaccine status: Up to date  Covid-19 vaccine status: Completed vaccines  Qualifies for Shingles Vaccine? Yes   Zostavax completed No   Shingrix Completed?: Yes  Screening Tests Health Maintenance  Topic Date Due   INFLUENZA VACCINE  09/07/2020   COVID-19 Vaccine (5 - Booster for Pfizer series) 10/26/2020   TETANUS/TDAP  10/11/2022   COLONOSCOPY (Pts 45-37yr Insurance coverage will need to be confirmed)  09/04/2025   DEXA  SCAN  Completed   Hepatitis C Screening  Completed   PNA vac Low Risk Adult  Completed   Zoster Vaccines- Shingrix  Completed   HPV VACCINES  Aged Out    Health Maintenance  Health Maintenance Due  Topic Date Due   INFLUENZA VACCINE  09/07/2020    Colorectal cancer screening: Type of screening: Colonoscopy. Completed 09/04/2020. Repeat every 5 years  Mammogram status: Completed 05/14/2020. Repeat every year  Bone Density: scheduled for 10/23/2020  Lung Cancer Screening: (Low Dose CT Chest recommended if Age 735-80years, 30 pack-year currently smoking OR have quit w/in 15years.) does not qualify.   Lung Cancer Screening Referral: not indicated   Additional Screening:  Hepatitis C Screening: does not qualify; Completed 01/01/2020  Vision Screening: Recommended annual ophthalmology exams for early detection of glaucoma and other disorders of the eye. Is the patient up to date with their annual eye exam?  Yes  Who is the provider or what is the name of the office in which the patient attends annual eye exams? Dr PJerline Pain  Dental Screening: Recommended annual dental exams for proper oral hygiene  Community Resource Referral / Chronic Care Management: CRR required this visit?  No   CCM required this visit?  No      Plan:     I have personally reviewed and noted the following in the patient's chart:   Medical and social history Use of alcohol, tobacco or illicit drugs  Current medications and supplements including opioid prescriptions.  Functional ability and status Nutritional status Physical activity Advanced directives List of other physicians Hospitalizations, surgeries, and ER visits in previous 12 months Vitals Screenings to include cognitive, depression, and falls Referrals and appointments  In addition, I have reviewed and discussed with patient certain preventive protocols, quality metrics, and best practice recommendations. A written personalized care plan  for preventive services as well as general preventive health recommendations were provided to patient.     AMassie Maroon CMA   10/17/2020   Nurse Notes: Ms YHandrahanwould like an appointment sooner that 11/16/2020 for injection for hip pain that has worsened

## 2020-10-19 ENCOUNTER — Other Ambulatory Visit: Payer: Self-pay | Admitting: Family Medicine

## 2020-10-19 DIAGNOSIS — R0982 Postnasal drip: Secondary | ICD-10-CM

## 2020-10-20 ENCOUNTER — Telehealth: Payer: Self-pay | Admitting: Pharmacist

## 2020-10-20 NOTE — Chronic Care Management (AMB) (Signed)
Chronic Care Management Pharmacy Assistant   Name: Cheryl [name]  MRN: [id number] DOB: 1940-04-18  Cheryl [name] is an 80 y.o. year old female who presents for her initial CCM visit with the clinical pharmacist.  Recent office visits:  10/17/20 Cheryl [name] PCP- Patient was seen for medicare annual wellness. Follow up in 1 year.  09/15/20 Cheryl Jobs NP- Patient was seen for post nasal drip and started on Azestaline HCI 0.1% . Follow up as needed.  08/13/20 Cheryl [name]- Patient was seen for anxiety and depression.Patient was taken off of Alprazolam, Hydroxozine, Zofran, and Sodium Sulfate. Patient's Paroxetine was increased to 40 mg daily. Labs were ordered and pt advised to follow up in 3 months.  07/02/20 Cheryl [name] PCP- Patient was seen for Diarrhea. A referral was placed to [facility] and pt stopped Loteprednol 0.5%. Return to PCP in 6 weeks.  04/28/20 [name] (Video)- Patient was seen for Episcleritis of left eye. Labs were ordered and patient stopped Azestaline and Cefdinir. Pt advised to follow up with PCP.  Recent consult visits:  10/15/20 Cheryl [name] Neurosurgery- Patient was seen for Cervicogenic Headache. Unable to view documentation.  10/14/20 Cheryl Latch MD Cardiology- Patient was seen for Essential HTN. Labs were ordered and referral to Exercise Program was placed. Patient was started on Omega 3 Fatty Acids 1000 mg daily. Follow up w/ [name] in May 2023.  08/24/20 Cheryl Givens NP- Patient was seen for Chronic Migraine. Pt advised to continue Ajovy inj. And follow up in 6 months.  08/06/20 [name] PA-C Gastro- Patient was seen for chronic constipation. A referral for [name] was placed and pt started Zofran 4 mg and Sodium Sulfate . Follow up PRN   Hospital visits:  None in previous 6 months  Medications: Outpatient Encounter Medications as of 10/20/2020  Medication Sig   aspirin 81 MG tablet Take 81 mg by mouth daily.   Azelastine  HCl 137 MCG/SPRAY SOLN PLACE 2 SPRAYS INTO BOTH NOSTRILS 2 (TWO) TIMES DAILY. USE IN EACH NOSTRIL AS DIRECTED   Cholecalciferol (VITAMIN D) 1000 UNITS capsule Take 1,000 Units by mouth daily.   Evolocumab (REPATHA SURECLICK) [id number] MG/ML SOAJ INJECT 140 MG INTO THE SKIN EVERY 14 (FOURTEEN) DAYS.   fluticasone (FLONASE) 50 MCG/ACT nasal spray INHALE 2 SPRAYS INTO EACH NOSTRIL EVERY DAY   Fremanezumab-vfrm (AJOVY) 225 MG/1.5ML SOAJ Inject 225 mg into the skin every 30 (thirty) days.   MAGNESIUM PO Take 1 tablet by mouth daily.    Omega-3 Fatty Acids (FISH OIL) 1000 MG CAPS Take 1 capsule (1,000 mg total) by mouth daily. (Patient taking differently: Take 2,000 mg by mouth daily.)   omeprazole (PRILOSEC) 20 MG capsule TAKE 1 CAPSULE TWICE DAILY BEFORE A MEAL   PARoxetine (PAXIL) 20 MG tablet Take 2 tablets (40 mg total) by mouth daily.   polyethylene glycol (MIRALAX / GLYCOLAX) packet Take 17 g by mouth daily.   RESTASIS 0.05 % ophthalmic emulsion Place 1 drop into both eyes 2 (two) times daily.    traMADol (ULTRAM) 50 MG tablet Take 2 tablets (100 mg total) by mouth daily as needed.   zolpidem (AMBIEN) 5 MG tablet TAKE 1 TABLET AT BEDTIME AS NEEDED SLEEP   No facility-administered encounter medications on file as of 10/20/2020.    Current Medication List aspirin 81 MG tablet Azelastine HCl 137 MCG/SPRAY SOLN last filled 10/13/20 30 DS Cholecalciferol (VITAMIN D) 1000 UNITS capsule  Evolocumab (REPATHA SURECLICK) [id number] MG/ML SOAJ last filled fluticasone (  FLONASE) 50 MCG/ACT nasal spray last filled 08/30/20 90 DS MAGNESIUM PO Omega-3 Fatty Acids (FISH OIL) 1000 MG CAPS last filled omeprazole (PRILOSEC) 20 MG capsule last filled 05/20/20 90 DS PARoxetine (PAXIL) 20 MG tablet last filled 09/02/20 90 DS polyethylene glycol (MIRALAX / GLYCOLAX) packet RESTASIS 0.05 % ophthalmic emulsion  traMADol (ULTRAM) 50 MG tablet last filled 07/20/20 90 DS zolpidem (AMBIEN) 5 MG tablet last filled 10/15/20 30  DS  [place] Pharmacist Assistant [phone]

## 2020-10-23 ENCOUNTER — Ambulatory Visit
Admission: RE | Admit: 2020-10-23 | Discharge: 2020-10-23 | Disposition: A | Discharge status: Home / Self Care | Payer: PPO | Source: Ambulatory Visit | Attending: Family Medicine | Admitting: Family Medicine

## 2020-10-23 ENCOUNTER — Encounter: Payer: Self-pay | Admitting: Family Medicine

## 2020-10-23 ENCOUNTER — Other Ambulatory Visit: Payer: Self-pay

## 2020-10-23 DIAGNOSIS — M8580 Other specified disorders of bone density and structure, unspecified site: Secondary | ICD-10-CM | POA: Insufficient documentation

## 2020-10-23 DIAGNOSIS — M8589 Other specified disorders of bone density and structure, multiple sites: Secondary | ICD-10-CM | POA: Diagnosis not present

## 2020-10-23 DIAGNOSIS — Z13820 Encounter for screening for osteoporosis: Secondary | ICD-10-CM

## 2020-10-23 DIAGNOSIS — Z780 Asymptomatic menopausal state: Secondary | ICD-10-CM | POA: Diagnosis not present

## 2020-10-23 NOTE — Progress Notes (Signed)
Your bone density test shows that you do have osteopenia.   The current recommendation for osteopenia (mildly thin bones) treatment includes:   #1 calcium-total of 1200 mg of calcium daily.  If you eat a very calcium rich diet you may be able to obtain that without a supplement.  If not, then I recommend calcium 500 mg twice a day.  There are several products over-the-counter such as Caltrate D and Viactiv chews which are great options that contain calcium and vitamin D. #2 vitamin D-recommend 800 international units daily. #3 exercise-recommend 30 minutes of weightbearing exercise 3 days a week.  Resistance training ,such as doing bands and light weights, can be particularly helpful.

## 2020-11-02 DIAGNOSIS — M47812 Spondylosis without myelopathy or radiculopathy, cervical region: Secondary | ICD-10-CM | POA: Diagnosis not present

## 2020-11-03 ENCOUNTER — Other Ambulatory Visit: Payer: Self-pay

## 2020-11-03 ENCOUNTER — Ambulatory Visit (INDEPENDENT_AMBULATORY_CARE_PROVIDER_SITE_OTHER): Payer: PPO | Admitting: Pharmacist

## 2020-11-03 DIAGNOSIS — E7800 Pure hypercholesterolemia, unspecified: Secondary | ICD-10-CM

## 2020-11-03 DIAGNOSIS — F32A Depression, unspecified: Secondary | ICD-10-CM

## 2020-11-03 DIAGNOSIS — F419 Anxiety disorder, unspecified: Secondary | ICD-10-CM

## 2020-11-03 NOTE — Progress Notes (Signed)
 Chronic Care Management Pharmacy Note  11/03/2020 Name:  Cheryl [name] MRN:  [id number] DOB:  06/23/1940  Summary: addressed HLD, mental health. Patient is without repatha due to [employer] grant closing and unable to afford. Will attempt patient assistance to see if this can help her instead of going without medication. Left paperwork at front desk for her to pick up, fill out, and return.  Patient doing well at paxil dose of 40mg (increased from 20mg).   Recommendations/Changes made from today's visit: Patient requests new RX of Paxil 40mg daily (she is running out of tables quickly by taking two of the 20mg.  Plan: f/u with pharmacist in 1 month  Subjective: Cheryl [name] is an 80 y.o. year old female who is a primary patient of Metheney, Catherine D, MD.  The CCM team was consulted for assistance with disease management and care coordination needs.    Engaged with patient by telephone for initial visit in response to provider referral for pharmacy case management and/or care coordination services.   Consent to Services:  The patient was given information about Chronic Care Management services, agreed to services, and gave verbal consent prior to initiation of services.  Please see initial visit note for detailed documentation.   Patient Care Team: Metheney, Catherine D, MD as PCP - General Marathon, Tiffany, MD as PCP - Cardiology (Cardiology) Perry, John N, MD as Attending Physician (Gastroenterology) Harkins, Paul, MD (Inactive) as Attending Physician (Pain Medicine) Digby, Donald, MD as Consulting Physician (Ophthalmology) ,  J, RPH as Pharmacist (Pharmacist)  Recent office visits:  10/17/20 Catherine Metheney PCP- Patient was seen for medicare annual wellness. Follow up in 1 year.   09/15/20 Taylor Beck NP- Patient was seen for post nasal drip and started on Azestaline HCI 0.1% . Follow up as needed.   08/13/20 Catherine Metheney- Patient was seen for  anxiety and depression.Patient was taken off of Alprazolam, Hydroxozine, Zofran, and Sodium Sulfate. Patient's Paroxetine was increased to 40 mg daily. Labs were ordered and pt advised to follow up in 3 months.   07/02/20 Catherine Metheney PCP- Patient was seen for Diarrhea. A referral was placed to Gastro and pt stopped Loteprednol 0.5%. Return to PCP in 6 weeks.   04/28/20 Jean S. Parker (Video)- Patient was seen for Episcleritis of left eye. Labs were ordered and patient stopped Azestaline and Cefdinir. Pt advised to follow up with PCP.   Recent consult visits:  10/15/20 Sarah Desai Neurosurgery- Patient was seen for Cervicogenic Headache. Unable to view documentation.   10/14/20 Tiffany Hudson MD Cardiology- Patient was seen for Essential HTN. Labs were ordered and referral to Exercise Program was placed. Patient was started on Omega 3 Fatty Acids 1000 mg daily. Follow up w/ Dr. [place] in May 2023.   08/24/20 Megan Millikan NP- Patient was seen for Chronic Migraine. Pt advised to continue Ajovy inj. And follow up in 6 months.   08/06/20 [name] PA-C Gastro- Patient was seen for chronic constipation. A referral for Gastro was placed and pt started Zofran 4 mg and Sodium Sulfate . Follow up PRN     Hospital visits:  None in previous 6 months  Objective:  Lab Results  Component Value Date   CREATININE 0.92 01/01/2020   CREATININE 0.82 07/31/2018   CREATININE 0.85 09/26/2017    Lab Results  Component Value Date   HGBA1C 5.7 (A) 08/13/2020   Last diabetic Eye exam: No results found for: HMDIABEYEEXA  Last diabetic Foot exam: No   results found for: HMDIABFOOTEX      Component Value Date/Time   CHOL 159 11/11/2019 0824   CHOL 244 (H) 03/02/2011 0045   TRIG 205 (H) 11/11/2019 0824   TRIG 303 (H) 03/02/2011 0045   HDL 46 11/11/2019 0824   HDL 45 03/02/2011 0045   CHOLHDL 3.5 11/11/2019 0824   CHOLHDL 4.9 10/04/2018 1037   VLDL 38 (H) 08/05/2016 0906   LDLCALC 79 11/11/2019  0824   LDLCALC 121 (H) 10/04/2018 1037   LDLCALC 138 (H) 03/02/2011 0045    Hepatic Function Latest Ref Rng & Units 01/01/2020 08/30/2019 07/31/2018  Total Protein 6.1 - 8.1 g/dL 6.5 6.7 6.5  Albumin 3.7 - 4.7 g/dL - 4.2 -  AST 10 - 35 U/L _0 ALT 6 - 29 U/L _1 Alk Phosphatase 48 - 121 IU/L - 103 -  Total Bilirubin 0.2 - 1.2 mg/dL 0.4 0.4 0.4  Bilirubin, Direct 0.00 - 0.40 mg/dL - 0.09 -    Lab Results  Component Value Date/Time   TSH 3.08 01/01/2020 10:33 AM   TSH 3.06 12/20/2018 02:03 PM    CBC Latest Ref Rng & Units 01/01/2020 09/26/2017 05/30/2017  WBC 3.8 - 10.8 Thousand/uL 6.9 7.1 6.4  Hemoglobin 11.7 - 15.5 g/dL 14.0 15.3 14.3  Hematocrit 35.0 - 45.0 % 41.1 46.5(H) 41.7  Platelets 140 - 400 Thousand/uL 278 279 253    Lab Results  Component Value Date/Time   VD25OH 47 05/30/2017 10:49 AM   VD25OH 30 09/26/2016 08:45 AM    Clinical ASCVD:  The 10-year ASCVD risk score (Arnett DK, et al., 2019) is: 18.9%   Values used to calculate the score:     Age: 80 years     Sex: Female     Is Non-Hispanic African American: No     Diabetic: No     Tobacco smoker: No     Systolic Blood Pressure: 719 mmHg     Is BP treated: No     HDL Cholesterol: 46 mg/dL     Total Cholesterol: 159 mg/dL     Social History   Tobacco Use  Smoking Status Former   Types: Cigarettes   Quit date: 02/07/1993   Years since quitting: 27.7  Smokeless Tobacco Never   BP Readings from Last 3 Encounters:  10/17/20 110/70  10/14/20 110/70  09/15/20 (!) 121/52   Pulse Readings from Last 3 Encounters:  10/17/20 74  10/14/20 74  09/15/20 79   Wt Readings from Last 3 Encounters:  10/17/20 176 lb (79.8 kg)  10/14/20 176 lb 12.8 oz (80.2 kg)  09/15/20 178 lb (80.7 kg)    Assessment: Review of patient past medical history, allergies, medications, health status, including review of consultants reports, laboratory and other test data, was performed as part of comprehensive evaluation  and provision of chronic care management services.   SDOH:  (Social Determinants of Health) assessments and interventions performed:    CCM Care Plan  Allergies  Allergen Reactions   Celebrex [Celecoxib] Itching, Nausea Only and Other (See Comments)    Gas, hair loss   Fluoxetine Hcl Shortness Of Breath and Itching    REACTION: itching,SOB   Amitriptyline     Dry mouth    Contrast Media [Iodinated Diagnostic Agents] Itching    Multilance.    Dexamethasone Diarrhea and Nausea And Vomiting   Livalo [Pitavastatin] Other (See Comments)    Myalgia    Metrizamide Itching    Multilance.  Nitroglycerin     Nausea,dizziness,sweats   Rosuvastatin Other (See Comments)    bodyaches and cramping   Simvastatin Other (See Comments)    Muscle aches and HA   Codeine Sulfate Itching    REACTION: itching   Sulfasalazine Nausea Only    REACTION: Nausea   Sulfonamide Derivatives Nausea Only    REACTION: Nausea    Medications Reviewed Today     Reviewed by Ashworth, Staci T, CMA (Certified Medical Assistant) on 10/17/20 at 1007  Med List Status: <None>   Medication Order Taking? Sig Documenting Provider Last Dose Status Informant  aspirin 81 MG tablet [id number] Yes Take 81 mg by mouth daily. [provider] Taking Active Self  Azelastine HCl 137 MCG/SPRAY SOLN [id number] Yes PLACE 2 SPRAYS INTO BOTH NOSTRILS 2 (TWO) TIMES DAILY. USE IN EACH NOSTRIL AS DIRECTED Beck, Taylor B, NP Taking Active   Cholecalciferol (VITAMIN D) 1000 UNITS capsule [id number] Yes Take 1,000 Units by mouth daily. [provider] Taking Active Self  Evolocumab (REPATHA SURECLICK) 140 MG/ML SOAJ [id number] Yes INJECT 140 MG INTO THE SKIN EVERY 14 (FOURTEEN) DAYS. Verdigris, Tiffany, MD Taking Active   fluticasone (FLONASE) 50 MCG/ACT nasal spray [id number] Yes INHALE 2 SPRAYS INTO EACH NOSTRIL EVERY DAY Metheney, Catherine D, MD Taking Active   Fremanezumab-vfrm (AJOVY) 225 MG/1.5ML SOAJ [id number] Yes  Inject 225 mg into the skin every 30 (thirty) days. Millikan, Megan, NP Taking Active   MAGNESIUM PO [id number] Yes Take 1 tablet by mouth daily.  [provider] Taking Active Self  Omega-3 Fatty Acids (FISH OIL) 1000 MG CAPS [id number] Yes Take 1 capsule (1,000 mg total) by mouth daily.  Patient taking differently: Take 2,000 mg by mouth daily.   Jolivue, Tiffany, MD Taking Active   omeprazole (PRILOSEC) 20 MG capsule [id number] Yes TAKE 1 CAPSULE TWICE DAILY BEFORE A MEAL Metheney, Catherine D, MD Taking Active   PARoxetine (PAXIL) 20 MG tablet [id number] Yes Take 2 tablets (40 mg total) by mouth daily. Metheney, Catherine D, MD Taking Active   polyethylene glycol (MIRALAX / GLYCOLAX) packet [id number] Yes Take 17 g by mouth daily. [provider] Taking Active   RESTASIS 0.05 % ophthalmic emulsion [id number] Yes Place 1 drop into both eyes 2 (two) times daily.  [provider] Taking Active Self  traMADol (ULTRAM) 50 MG tablet [id number] Yes Take 2 tablets (100 mg total) by mouth daily as needed. Thekkekandam, Thomas J, MD Taking Active   zolpidem (AMBIEN) 5 MG tablet [id number] Yes TAKE 1 TABLET AT BEDTIME AS NEEDED SLEEP Metheney, Catherine D, MD Taking Active             Patient Active Problem List   Diagnosis Date Noted   Osteopenia 10/23/2020   Abnormal glucose 08/13/2020   Chronic migraine without aura, with intractable migraine, so stated, with status migrainosus 07/03/2019   Carotid stenosis, bilateral 06/06/2019   Migraine headache 03/22/2019   Daily headache 03/22/2019   Myofascial pain 03/27/2018   Numbness of left foot 11/07/2017   Right foot pain 09/12/2017   Deviated septum 01/25/2017   ETD (Eustachian tube dysfunction), bilateral 01/25/2017   Seasonal allergic rhinitis 01/25/2017   Bilateral hearing loss 01/25/2017   S/P lumbar fusion 06/20/2016   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   Fatty liver 12/22/2015    Onychodystrophy 11/24/2015   Prepatellar bursitis of right knee 08/18/2014   Hyperlipidemia 12/17/2013   Osteoarthritis of left knee 07/05/2013   Obesity 03/10/2011   Dyspepsia 02/10/2011     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   PALPITATIONS 02/11/2010   OSTEOPENIA 11/27/2009   CONSTIPATION, CHRONIC 08/15/2008   Chronic neck pain 04/11/2008   Disorder of kidney and ureter 01/21/2008   HEMORRHOIDS, INTERNAL 01/16/2008   DIVERTICULOSIS, COLON 01/16/2008   POSTMENOPAUSAL STATUS 12/05/2007   MICROSCOPIC HEMATURIA 06/05/2007   STENOSIS, LUMBAR SPINE 12/22/2005   Anxiety and depression 11/15/2005   IRRITABLE BOWEL SYNDROME 11/15/2005   ARTHRITIS 11/15/2005    Immunization History  Administered Date(s) Administered   Fluad Quad(high Dose 65+) 10/04/2018, 11/19/2019   Hep A / Hep B 12/22/2015, 01/19/2016, 06/20/2016   Influenza Split 12/20/2011   Influenza Whole 11/08/2004, 11/08/2005, 11/24/2006, 11/12/2007, 12/15/2008, 11/27/2009   Influenza, High Dose Seasonal PF 09/26/2016   Influenza,inj,Quad PF,6+ Mos 10/10/2012, 10/01/2013, 12/31/2014, 10/05/2015, 09/21/2017   Influenza-Unspecified 09/21/2017   PFIZER Comirnaty(Gray Top)Covid-19 Tri-Sucrose Vaccine 06/25/2020   PFIZER(Purple Top)SARS-COV-2 Vaccination 03/01/2019, 03/22/2019, 12/21/2019, 06/25/2020   Pneumococcal Conjugate-13 12/17/2013   Pneumococcal Polysaccharide-23 02/07/2005, 12/15/2010   Td 02/07/2002   Tdap 10/10/2012   Zoster Recombinat (Shingrix) 01/24/2020, 05/15/2020   Zoster, Live 11/27/2009    Conditions to be addressed/monitored: HLD, Anxiety, and Depression  There are no care plans that you recently modified to display for this patient.   Medication Assistance:  Will attempt patient assistance for repatha.  Patient's preferred pharmacy is:  [id number] - [place], [place] - [address]   [address] Phone: [phone] Fax: [phone]  [facility] - [place], [place] - [address] [place] [address] Phone: [phone] Fax: [phone]  Uses pill box? Yes Pt endorses 100% compliance  Follow Up:  Patient agrees to Care Plan and Follow-up.  Plan: Telephone follow up appointment with care management team member scheduled for:  1 month   J       

## 2020-11-04 NOTE — Patient Instructions (Signed)
Visit Information   PATIENT GOALS:   Goals Addressed   None     Consent to CCM Services: Cheryl [name] was given information about Chronic Care Management services including:  CCM service includes personalized support from designated clinical staff supervised by her physician, including individualized plan of care and coordination with other care providers 24/7 contact phone numbers for assistance for urgent and routine care needs. Service will only be billed when office clinical staff spend 20 minutes or more in a month to coordinate care. Only one practitioner may furnish and bill the service in a calendar month. The patient may stop CCM services at any time (effective at the end of the month) by phone call to the office staff. The patient will be responsible for cost sharing (co-pay) of up to 20% of the service fee (after annual deductible is met).  Patient agreed to services and verbal consent obtained.   Patient verbalizes understanding of instructions provided today and agrees to view in [place].   Telephone follow up appointment with care management team member scheduled for: 1 month  CLINICAL CARE PLAN: There are no care plans to display for this patient.

## 2020-11-06 DIAGNOSIS — E7800 Pure hypercholesterolemia, unspecified: Secondary | ICD-10-CM

## 2020-11-06 DIAGNOSIS — F32A Depression, unspecified: Secondary | ICD-10-CM | POA: Diagnosis not present

## 2020-11-06 DIAGNOSIS — F419 Anxiety disorder, unspecified: Secondary | ICD-10-CM

## 2020-11-10 ENCOUNTER — Other Ambulatory Visit: Payer: Self-pay | Admitting: *Deleted

## 2020-11-10 DIAGNOSIS — F419 Anxiety disorder, unspecified: Secondary | ICD-10-CM

## 2020-11-10 MED ORDER — PAROXETINE HCL 40 MG PO TABS: 40.0000 mg | ORAL_TABLET | Freq: Every day | ORAL | 1 refills | Status: DC

## 2020-11-16 ENCOUNTER — Ambulatory Visit: Payer: PPO | Admitting: Family Medicine

## 2020-11-17 ENCOUNTER — Other Ambulatory Visit: Payer: Self-pay

## 2020-11-17 ENCOUNTER — Ambulatory Visit (INDEPENDENT_AMBULATORY_CARE_PROVIDER_SITE_OTHER): Payer: PPO | Admitting: Pharmacist

## 2020-11-17 DIAGNOSIS — E7800 Pure hypercholesterolemia, unspecified: Secondary | ICD-10-CM

## 2020-11-17 DIAGNOSIS — F32A Depression, unspecified: Secondary | ICD-10-CM

## 2020-11-17 NOTE — Patient Instructions (Signed)
Visit Information  PATIENT GOALS:  Goals Addressed             This Visit's Progress    Medication Management       Patient Goals/Self-Care Activities Over the next 30 days, patient will:  take medications as prescribed and collaborate with provider on medication access solutions  Follow Up Plan: Telephone follow up appointment with care management team member scheduled for:  30 days          Patient verbalizes understanding of instructions provided today and agrees to view in [place].   Telephone follow up appointment with care management team member scheduled for: 1 month  Darius Bump

## 2020-11-17 NOTE — Progress Notes (Signed)
Chronic Care Management Pharmacy Note  11/17/2020 Name:  Cheryl [name] MRN:  [id number] DOB:  09/05/1940  Summary: addressed HLD, mental health. Patient is without repatha due to [employer] closing and unable to afford. Began to facilitate patient assistance, but patient felt uncomfortable with financial aspects of the form (she meets the income threshold, but her savings/assets exceed the threshold). Advised her it is okay to answer the questions/fill out the application truthfully and see if the company denies or accepts, she will not get in trouble for attempting to apply. She wants to wait and think on it.   Recommendations/Changes made from today's visit: none  Plan: f/u with pharmacist in 1 month  Subjective: Cheryl [name] is an 80 y.o. year old female who is a primary patient of Cheryl [name], Cheryl Kocher, MD.  The CCM team was consulted for assistance with disease management and care coordination needs.    Engaged with patient by telephone for initial visit in response to provider referral for pharmacy case management and/or care coordination services.   Consent to Services:  The patient was given information about Chronic Care Management services, agreed to services, and gave verbal consent prior to initiation of services.  Please see initial visit note for detailed documentation.   Patient Care Team: Cheryl Marry, MD as PCP - General Cheryl Latch, MD as PCP - Cardiology (Cardiology) Cheryl Shipper, MD as Attending Physician (Gastroenterology) Cheryl Hakim, MD (Inactive) as Attending Physician (Pain Medicine) Cheryl Cantor, MD as Consulting Physician (Ophthalmology) Cheryl [name], [facility] as Pharmacist (Pharmacist)  Recent office visits:  10/17/20 Cheryl [name] PCP- Patient was seen for medicare annual wellness. Follow up in 1 year.   09/15/20 Cheryl Jobs NP- Patient was seen for post nasal drip and started on Azestaline HCI 0.1% . Follow up  as needed.   08/13/20 Cheryl [name]- Patient was seen for anxiety and depression.Patient was taken off of Alprazolam, Hydroxozine, Zofran, and Sodium Sulfate. Patient's Paroxetine was increased to 40 mg daily. Labs were ordered and pt advised to follow up in 3 months.   07/02/20 Cheryl [name] PCP- Patient was seen for Diarrhea. A referral was placed to [facility] and pt stopped Loteprednol 0.5%. Return to PCP in 6 weeks.   04/28/20 Cheryl [name] (Video)- Patient was seen for Episcleritis of left eye. Labs were ordered and patient stopped Azestaline and Cefdinir. Pt advised to follow up with PCP.   Recent consult visits:  10/15/20 Cheryl [name] Neurosurgery- Patient was seen for Cervicogenic Headache. Unable to view documentation.   10/14/20 Cheryl Latch MD Cardiology- Patient was seen for Essential HTN. Labs were ordered and referral to Exercise Program was placed. Patient was started on Omega 3 Fatty Acids 1000 mg daily. Follow up w/ [name] in May 2023.   08/24/20 Cheryl Givens NP- Patient was seen for Chronic Migraine. Pt advised to continue Ajovy inj. And follow up in 6 months.   08/06/20 Cheryl Esterwood PA-C Gastro- Patient was seen for chronic constipation. A referral for Cheryl [name] was placed and pt started Zofran 4 mg and Sodium Sulfate . Follow up PRN     Hospital visits:  None in previous 6 months  Objective:  Lab Results  Component Value Date   CREATININE 0.92 01/01/2020   CREATININE 0.82 07/31/2018   CREATININE 0.85 09/26/2017    Lab Results  Component Value Date   HGBA1C 5.7 (A) 08/13/2020   Last diabetic Eye exam: No results found for: HMDIABEYEEXA  Last diabetic  Foot exam: No results found for: HMDIABFOOTEX      Component Value Date/Time   CHOL 159 11/11/2019 0824   CHOL 244 (H) 03/02/2011 0045   TRIG 205 (H) 11/11/2019 0824   TRIG 303 (H) 03/02/2011 0045   HDL 46 11/11/2019 0824   HDL 45 03/02/2011 0045   CHOLHDL 3.5 11/11/2019 0824   CHOLHDL 4.9  10/04/2018 1037   VLDL 38 (H) 08/05/2016 0906   LDLCALC 79 11/11/2019 0824   LDLCALC 121 (H) 10/04/2018 1037   LDLCALC 138 (H) 03/02/2011 0045    Hepatic Function Latest Ref Rng & Units 01/01/2020 08/30/2019 07/31/2018  Total Protein 6.1 - 8.1 g/dL 6.5 6.7 6.5  Albumin 3.7 - 4.7 g/dL - 4.2 -  AST 10 - 35 U/L 26 28 24   ALT 6 - 29 U/L 20 23 18   Alk Phosphatase 48 - 121 IU/L - 103 -  Total Bilirubin 0.2 - 1.2 mg/dL 0.4 0.4 0.4  Bilirubin, Direct 0.00 - 0.40 mg/dL - 0.09 -    Lab Results  Component Value Date/Time   TSH 3.08 01/01/2020 10:33 AM   TSH 3.06 12/20/2018 02:03 PM    CBC Latest Ref Rng & Units 01/01/2020 09/26/2017 05/30/2017  WBC 3.8 - 10.8 Thousand/uL 6.9 7.1 6.4  Hemoglobin 11.7 - 15.5 g/dL 14.0 15.3 14.3  Hematocrit 35.0 - 45.0 % 41.1 46.5(H) 41.7  Platelets 140 - 400 Thousand/uL 278 279 253    Lab Results  Component Value Date/Time   VD25OH 47 05/30/2017 10:49 AM   VD25OH 30 09/26/2016 08:45 AM    Clinical ASCVD:  The 10-year ASCVD risk score (Arnett DK, et al., 2019) is: 18.9%   Values used to calculate the score:     Age: 41 years     Sex: Female     Is Non-Hispanic African American: No     Diabetic: No     Tobacco smoker: No     Systolic Blood Pressure: 637 mmHg     Is BP treated: No     HDL Cholesterol: 46 mg/dL     Total Cholesterol: 159 mg/dL     Social History   Tobacco Use  Smoking Status Former   Types: Cigarettes   Quit date: 02/07/1993   Years since quitting: 27.7  Smokeless Tobacco Never   BP Readings from Last 3 Encounters:  10/17/20 110/70  10/14/20 110/70  09/15/20 (!) 121/52   Pulse Readings from Last 3 Encounters:  10/17/20 74  10/14/20 74  09/15/20 79   Wt Readings from Last 3 Encounters:  10/17/20 176 lb (79.8 kg)  10/14/20 176 lb 12.8 oz (80.2 kg)  09/15/20 178 lb (80.7 kg)    Assessment: Review of patient past medical history, allergies, medications, health status, including review of consultants reports, laboratory  and other test data, was performed as part of comprehensive evaluation and provision of chronic care management services.   SDOH:  (Social Determinants of Health) assessments and interventions performed:    CCM Care Plan  Allergies  Allergen Reactions   Celebrex [Celecoxib] Itching, Nausea Only and Other (See Comments)    Gas, hair loss   Fluoxetine Hcl Shortness Of Breath and Itching    REACTION: itching,SOB   Amitriptyline     Dry mouth    Contrast Media [Iodinated Diagnostic Agents] Itching    Multilance.    Dexamethasone Diarrhea and Nausea And Vomiting   Livalo [Pitavastatin] Other (See Comments)    Myalgia    Metrizamide Itching  Multilance.    Nitroglycerin     Nausea,dizziness,sweats   Rosuvastatin Other (See Comments)    bodyaches and cramping   Simvastatin Other (See Comments)    Muscle aches and HA   Codeine Sulfate Itching    REACTION: itching   Sulfasalazine Nausea Only    REACTION: Nausea   Sulfonamide Derivatives Nausea Only    REACTION: Nausea    Medications Reviewed Today     Reviewed by Cheryl [name], [place] (Pharmacist) on 11/17/20 at 1053  Med List Status: <None>   Medication Order Taking? Sig Documenting Provider Last Dose Status Informant  aspirin 81 MG tablet [id number] No Take 81 mg by mouth daily. [provider] Taking Active Self  Azelastine HCl 137 MCG/SPRAY SOLN [id number] No PLACE 2 SPRAYS INTO BOTH NOSTRILS 2 (TWO) TIMES DAILY. USE IN EACH NOSTRIL AS DIRECTED Terrilyn Saver, NP Taking Active   Cholecalciferol (VITAMIN D) 1000 UNITS capsule [id number] No Take 2,000 Units by mouth daily. [provider] Taking Active Self  Evolocumab (REPATHA SURECLICK) 818 MG/ML SOAJ [id number] No INJECT 140 MG INTO THE SKIN EVERY 14 (FOURTEEN) DAYS. Cheryl Latch, MD Taking Active   fluticasone [facility]) 50 MCG/ACT nasal spray [id number] No INHALE 2 SPRAYS INTO EACH NOSTRIL EVERY DAY Cheryl Marry, MD Taking Active    Fremanezumab-vfrm (AJOVY) 225 MG/1.5ML Darden Palmer [id number] No Inject 225 mg into the skin every 30 (thirty) days. Cheryl Givens, NP Taking Active   MAGNESIUM PO [id number] No Take 1 tablet by mouth daily.  [provider] Taking Active Self  Omega-3 Fatty Acids (FISH OIL) 1000 MG CAPS [id number] No Take 1 capsule (1,000 mg total) by mouth daily.  Patient taking differently: Take 2,000 mg by mouth daily.   Cheryl Latch, MD Taking Active   omeprazole (PRILOSEC) 20 MG capsule [id number] No TAKE 1 CAPSULE TWICE DAILY BEFORE A MEAL Cheryl Marry, MD Taking Active   PARoxetine (PAXIL) 40 MG tablet [id number]  Take 1 tablet (40 mg total) by mouth daily. Cheryl Marry, MD  Active   polyethylene glycol [facility] / GLYCOLAX) packet [id number] No Take 17 g by mouth daily. [provider] Taking Active   RESTASIS 0.05 % ophthalmic emulsion [id number] No Place 1 drop into both eyes 2 (two) times daily.  [provider] Taking Active Self  traMADol (ULTRAM) 50 MG tablet [id number] No Take 2 tablets (100 mg total) by mouth daily as needed. Silverio Decamp, MD Taking Active   zolpidem [facility]) 5 MG tablet [id number] No TAKE 1 TABLET AT BEDTIME AS NEEDED SLEEP Cheryl Marry, MD Taking Active             Patient Active Problem List   Diagnosis Date Noted   Osteopenia 10/23/2020   Abnormal glucose 08/13/2020   Chronic migraine without aura, with intractable migraine, so stated, with status migrainosus 07/03/2019   Carotid stenosis, bilateral 06/06/2019   Migraine headache 03/22/2019   Daily headache 03/22/2019   Myofascial pain 03/27/2018   Numbness of left foot 11/07/2017   Right foot pain 09/12/2017   Deviated septum 01/25/2017   ETD (Eustachian tube dysfunction), bilateral 01/25/2017   Seasonal allergic rhinitis 01/25/2017   Bilateral hearing loss 01/25/2017   S/P lumbar fusion 06/20/2016   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    Fatty liver 12/22/2015   Onychodystrophy 11/24/2015   Prepatellar bursitis of right knee 08/18/2014   Hyperlipidemia 12/17/2013   Osteoarthritis of left knee 07/05/2013   Obesity 03/10/2011   Dyspepsia  02/10/2011   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   PALPITATIONS 02/11/2010   OSTEOPENIA 11/27/2009   CONSTIPATION, CHRONIC 08/15/2008   Chronic neck pain 04/11/2008   Disorder of kidney and ureter 01/21/2008   HEMORRHOIDS, INTERNAL 01/16/2008   DIVERTICULOSIS, COLON 01/16/2008   POSTMENOPAUSAL STATUS 12/05/2007   MICROSCOPIC HEMATURIA 06/05/2007   STENOSIS, LUMBAR SPINE 12/22/2005   Anxiety and depression 11/15/2005   IRRITABLE BOWEL SYNDROME 11/15/2005   ARTHRITIS 11/15/2005    Immunization History  Administered Date(s) Administered   Fluad Quad(high Dose 65+) 10/04/2018, 11/19/2019   Hep A / Hep B 12/22/2015, 01/19/2016, 06/20/2016   Influenza Split 12/20/2011   Influenza Whole 11/08/2004, 11/08/2005, 11/24/2006, 11/12/2007, 12/15/2008, 11/27/2009   Influenza, High Dose Seasonal PF 09/26/2016   Influenza,inj,Quad PF,6+ Mos 10/10/2012, 10/01/2013, 12/31/2014, 10/05/2015, 09/21/2017   Influenza-Unspecified 09/21/2017   PFIZER Comirnaty(Gray Top)Covid-19 Tri-Sucrose Vaccine 06/25/2020   PFIZER(Purple Top)SARS-COV-2 Vaccination 03/01/2019, 03/22/2019, 12/21/2019, 06/25/2020   Pneumococcal Conjugate-13 12/17/2013   Pneumococcal Polysaccharide-23 02/07/2005, 12/15/2010   Td 02/07/2002   Tdap 10/10/2012   Zoster Recombinat (Shingrix) 01/24/2020, 05/15/2020   Zoster, Live 11/27/2009    Conditions to be addressed/monitored: HLD, Anxiety, and Depression  Care Plan : Medication Management  Updates made by Cheryl [name], [place] since 11/17/2020 12:00 AM     Problem: HLD, mental health      Long-Range Goal: Disease Progression Prevention   Start Date: 11/03/2020  Recent Progress: On track  Priority: High  Note:   Current Barriers:  Unable to independently afford  treatment regimen  Pharmacist Clinical Goal(s):  Over the next 30 days, patient will verbalize ability to afford treatment regimen adhere to plan to optimize therapeutic regimen for chronic conditions as evidenced by report of adherence to recommended medication management changes through collaboration with PharmD and provider.   Interventions: 1:1 collaboration with Cheryl Marry, MD regarding development and update of comprehensive plan of care as evidenced by provider attestation and co-signature Inter-disciplinary care team collaboration (see longitudinal plan of care) Comprehensive medication review performed; medication list updated in electronic medical record  Hyperlipidemia:  Uncontrolled/controlled; current treatment:repatha 177m every 2 weeks, fish oil 466mdaily ;   Recommended continue current regimen Assessed patient finances. Began repatha patient assistance to attempt cost coverage as [employer] stopped and patient is without medication due to affordability. Patient is still working on paperwork/deciding whether to pursue or not.  and Depression/Anxiety:  Controlled; current treatment:Paxil 4011maily;   Recommended continue current regimen  Patient Goals/Self-Care Activities Over the next 30 days, patient will:  take medications as prescribed and collaborate with provider on medication access solutions  Follow Up Plan: Telephone follow up appointment with care management team member scheduled for:  30 days       Medication Assistance:  Will attempt patient assistance for repatha.  Patient's [facility] pharmacy [facility]:  [id number]: [phone]: [phone] [id number]: [phone]: [phone] pill box? Yes Pt endorses 100%  compliance  Follow Up:  Patient agrees to Care Plan and Follow-up.  Plan: Telephone follow up appointment with care management team member scheduled for:  1 month  KeesDarius [name]

## 2020-11-26 DIAGNOSIS — Z6829 Body mass index (BMI) 29.0-29.9, adult: Secondary | ICD-10-CM | POA: Diagnosis not present

## 2020-11-26 DIAGNOSIS — M542 Cervicalgia: Secondary | ICD-10-CM | POA: Diagnosis not present

## 2020-11-26 DIAGNOSIS — R519 Headache, unspecified: Secondary | ICD-10-CM | POA: Diagnosis not present

## 2020-11-26 DIAGNOSIS — M47812 Spondylosis without myelopathy or radiculopathy, cervical region: Secondary | ICD-10-CM | POA: Diagnosis not present

## 2020-11-28 ENCOUNTER — Other Ambulatory Visit: Payer: Self-pay | Admitting: Family Medicine

## 2020-12-01 ENCOUNTER — Telehealth: Payer: Self-pay | Admitting: Adult Health

## 2020-12-01 DIAGNOSIS — G43711 Chronic migraine without aura, intractable, with status migrainosus: Secondary | ICD-10-CM

## 2020-12-01 MED ORDER — AJOVY 225 MG/1.5ML ~~LOC~~ SOAJ
225.0000 mg | SUBCUTANEOUS | 2 refills | Status: DC
Start: 2020-12-01 — End: 2021-02-15

## 2020-12-01 NOTE — Telephone Encounter (Signed)
Pt called requesting refill for Fremanezumab-vfrm (AJOVY) 225 MG/1.5ML SOAJ. Pharmacy [facility] [place], [place].

## 2020-12-01 NOTE — Addendum Note (Signed)
Addended by: Gildardo Griffes on: 12/01/2020 03:24 PM   Modules accepted: Orders

## 2020-12-07 DIAGNOSIS — F419 Anxiety disorder, unspecified: Secondary | ICD-10-CM | POA: Diagnosis not present

## 2020-12-07 DIAGNOSIS — E7800 Pure hypercholesterolemia, unspecified: Secondary | ICD-10-CM

## 2020-12-07 DIAGNOSIS — F32A Depression, unspecified: Secondary | ICD-10-CM

## 2020-12-07 DIAGNOSIS — I10 Essential (primary) hypertension: Secondary | ICD-10-CM | POA: Diagnosis not present

## 2020-12-07 LAB — COMPREHENSIVE METABOLIC PANEL
ALT: 28 IU/L (ref 0–32)
AST: 31 IU/L (ref 0–40)
Albumin/Globulin Ratio: 1.9 (ref 1.2–2.2)
Albumin: 4.2 g/dL (ref 3.7–4.7)
Alkaline Phosphatase: 96 IU/L (ref 44–121)
BUN/Creatinine Ratio: 15 (ref 12–28)
BUN: 13 mg/dL (ref 8–27)
Bilirubin Total: 0.4 mg/dL (ref 0.0–1.2)
CO2: 26 mmol/L (ref 20–29)
Calcium: 9.6 mg/dL (ref 8.7–10.3)
Chloride: 100 mmol/L (ref 96–106)
Creatinine, Ser: 0.84 mg/dL (ref 0.57–1.00)
Globulin, Total: 2.2 g/dL (ref 1.5–4.5)
Glucose: 105 mg/dL — ABNORMAL HIGH (ref 70–99)
Potassium: 4.5 mmol/L (ref 3.5–5.2)
Sodium: 139 mmol/L (ref 134–144)
Total Protein: 6.4 g/dL (ref 6.0–8.5)
eGFR: 71 mL/min/{1.73_m2} (ref >59)

## 2020-12-07 LAB — LIPID PANEL
Chol/HDL Ratio: 3.9 ratio (ref 0.0–4.4)
Cholesterol, Total: 185 mg/dL (ref 100–199)
HDL: 48 mg/dL (ref >39)
LDL Chol Calc (NIH): 109 mg/dL — ABNORMAL HIGH (ref 0–99)
Triglycerides: 161 mg/dL — ABNORMAL HIGH (ref 0–149)
VLDL Cholesterol Cal: 28 mg/dL (ref 5–40)

## 2020-12-09 ENCOUNTER — Encounter (HOSPITAL_BASED_OUTPATIENT_CLINIC_OR_DEPARTMENT_OTHER): Payer: Self-pay

## 2020-12-11 ENCOUNTER — Telehealth: Payer: Self-pay | Admitting: Pharmacist

## 2020-12-11 NOTE — Telephone Encounter (Addendum)
[employer] grant renewed.  CARD NO. [id number]   CARD STATUS OK   BIN [id number]   PCN [id number]   PC GROUP [id number]

## 2020-12-14 ENCOUNTER — Encounter: Payer: Self-pay | Admitting: Family Medicine

## 2020-12-14 ENCOUNTER — Ambulatory Visit (INDEPENDENT_AMBULATORY_CARE_PROVIDER_SITE_OTHER): Payer: PPO

## 2020-12-14 ENCOUNTER — Other Ambulatory Visit: Payer: Self-pay

## 2020-12-14 ENCOUNTER — Ambulatory Visit (INDEPENDENT_AMBULATORY_CARE_PROVIDER_SITE_OTHER): Payer: PPO | Admitting: Family Medicine

## 2020-12-14 VITALS — BP 124/55 | HR 72 | Ht 65.0 in | Wt 177.0 lb

## 2020-12-14 DIAGNOSIS — M545 Low back pain, unspecified: Secondary | ICD-10-CM | POA: Diagnosis not present

## 2020-12-14 DIAGNOSIS — R1314 Dysphagia, pharyngoesophageal phase: Secondary | ICD-10-CM | POA: Diagnosis not present

## 2020-12-14 DIAGNOSIS — R599 Enlarged lymph nodes, unspecified: Secondary | ICD-10-CM | POA: Diagnosis not present

## 2020-12-14 DIAGNOSIS — W19XXXA Unspecified fall, initial encounter: Secondary | ICD-10-CM

## 2020-12-14 DIAGNOSIS — M549 Dorsalgia, unspecified: Secondary | ICD-10-CM

## 2020-12-14 DIAGNOSIS — M542 Cervicalgia: Secondary | ICD-10-CM

## 2020-12-14 DIAGNOSIS — R1310 Dysphagia, unspecified: Secondary | ICD-10-CM | POA: Diagnosis not present

## 2020-12-14 DIAGNOSIS — S3992XA Unspecified injury of lower back, initial encounter: Secondary | ICD-10-CM | POA: Diagnosis not present

## 2020-12-14 DIAGNOSIS — M4184 Other forms of scoliosis, thoracic region: Secondary | ICD-10-CM | POA: Diagnosis not present

## 2020-12-14 NOTE — Progress Notes (Signed)
Established Patient Office Visit  Subjective:  Patient ID: Cheryl [name], female    DOB: 1940/12/17  Age: 80 y.o. MRN: [id number]  CC:  Chief Complaint  Patient presents with   Fall    HPI Cheryl [name] presents for fall and back injury.  She was getting up out of her recliner about 4 weeks ago on October 10 and fell backwards straight on her back and hit the back of her head.  Ever since then she has been having more frequent headaches but she gets migraines so frequently she is hard for her to tell if it was the head or the migraines.  She did not lose consciousness.  She did not have any nausea or vomiting after the head injury.  She says what concerns her the most though is her mid back pain that radiates down into her low back.  She has a prior history of lumbar surgery.  She says it is painful to get up and do things for more than about 10 or 15 minutes and she has been using her heating pad regularly.  She did want to let me know that the [place] open back up and she has applied again to see if they will provide medication for her.  Also wanted to let me know that she is been having some intermittent pain on the left side of her neck and for the sternocleidomastoid near the jawline.  She says it is painful to swallow at times.  But its not always when she eats or swallows.  Is been going on for a few weeks but seems to be coming and going.  He also notes that she is had more difficulty swallowing bread and meat.  That has been going on for months.  She denies any choking episodes.  Past Medical History:  Diagnosis Date   Anemia    Anxiety    d/t recent family issues   Arthritis    Bone spur    Carotid stenosis, bilateral 06/06/2019   Chronic back pain    stenosis and OA   Depression    takes Paxil daily   Diverticulosis    Dizzy    occasionally   Encephalitis    at 9 months ago    Fatty liver    has had 2 Hep shots and the final one is in June 18   GERD  (gastroesophageal reflux disease)    takes Omeprazole daily   Hepatitis 2012   History of bronchitis    20-25 yrs ago   History of colon polyps    precancerous   History of shingles    Hyperlipidemia    takes Simvastatin daily   IBS (irritable bowel syndrome)    more on side of constipation-takes Miralax daily   Insomnia    takes Melatonin and Ambien nightly   Internal hemorrhoids    Joint pain    Joint swelling    Lung nodules    calcified    Migraine headache    migraines-thinks coming from neck.Last one1/28/18   Pneumonia    hx of-20 yrs ago-=-walking   Renal cyst    left   Uterine cancer ([place]) 1973    Past Surgical History:  Procedure Laterality Date   APPENDECTOMY  1966   cataracts     bilateral   COLONOSCOPY  09/04/2020   EUS  02/10/2011   Procedure: UPPER ENDOSCOPIC ULTRASOUND (EUS) RADIAL;  Surgeon: Owens Loffler, MD;  Location: [facility];  Service: Endoscopy;  Laterality: N/A;  radial linear    LUMBAR FUSION  03/2016   L4-5, Dr. Ola Spurr.   [place]   ROTATOR CUFF REPAIR  2009   left   TONSILLECTOMY     UPPER GASTROINTESTINAL ENDOSCOPY     VAGINAL HYSTERECTOMY  1973    Family History  Problem Relation Age of Onset   Kidney disease Mother    Kidney failure Mother        med induced   Rheum arthritis Mother    Heart disease Father    Alcohol abuse Father    Lung cancer Father        smoker   Depression Brother    Crohn's disease Brother    Alcohol abuse Brother    Heart failure Daughter    Lung cancer Son    HIV Son    Heart disease Other        Paternal family    Malignant hyperthermia Neg Hx    Colon cancer Neg Hx    Esophageal cancer Neg Hx    Rectal cancer Neg Hx    Stomach cancer Neg Hx    Migraines Neg Hx     Social History   Socioeconomic History   Marital status: Divorced    Spouse name: Not on file   Number of children: 5   Years of education: Not on file   Highest education level: Not on file   Occupational History   Occupation: Retired      Fish farm manager: RETIRED  Tobacco Use   Smoking status: Former    Types: Cigarettes    Quit date: 02/07/1993    Years since quitting: 27.8   Smokeless tobacco: Never  Vaping Use   Vaping Use: Never used  Substance and Sexual Activity   Alcohol use: No   Drug use: No   Sexual activity: Not on file  Other Topics Concern   Not on file  Social History Narrative   10/17/2020      Lives alone with yorky dog      Has trouble walking right now (hip pain that she has spoken with pcp)       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                 Epworth Sleepiness Scale = 1 (as of 04/10/15)      07/03/2019   Lives at home alone with her dog   Right handed    Caffeine: tea occasional   Social Determinants of Health   Financial Resource Strain: Not on file  Food Insecurity: Not on file  Transportation Needs: Not on file  Physical Activity: Not on file  Stress: Not on file  Social Connections: Not on file  Intimate Partner Violence: Not on file    Outpatient Medications Prior to Visit  Medication Sig Dispense Refill   aspirin 81 MG tablet Take 81 mg by mouth daily.     Cholecalciferol (VITAMIN D) 1000 UNITS capsule Take 2,000 Units by mouth daily.     Evolocumab (REPATHA SURECLICK) 226 MG/ML SOAJ INJECT 140 MG INTO THE SKIN EVERY 14 (FOURTEEN) DAYS. 2 mL 11   fluticasone (FLONASE) 50 MCG/ACT nasal spray INSTILL 2 SPRAYS INTO EACH NOSTRIL EVERY DAY 48 mL 1   Fremanezumab-vfrm (AJOVY) 225 MG/1.5ML SOAJ Inject 225 mg into the skin every 30 (thirty) days. 4.5 mL 2   MAGNESIUM PO Take 1  tablet by mouth daily.      Omega-3 Fatty Acids (FISH OIL) 1000 MG CAPS Take 1 capsule (1,000 mg total) by mouth daily. (Patient taking differently: Take 2,000 mg by mouth daily.) 90 capsule 3   omeprazole (PRILOSEC) 20 MG capsule TAKE 1 CAPSULE TWICE DAILY BEFORE A MEAL 180 capsule 3   PARoxetine (PAXIL) 40 MG tablet Take 1 tablet (40 mg total)  by mouth daily. 90 tablet 1   polyethylene glycol (MIRALAX / GLYCOLAX) packet Take 17 g by mouth daily.     RESTASIS 0.05 % ophthalmic emulsion Place 1 drop into both eyes 2 (two) times daily.      traMADol (ULTRAM) 50 MG tablet Take 2 tablets (100 mg total) by mouth daily as needed. 180 tablet 1   zolpidem (AMBIEN) 5 MG tablet TAKE 1 TABLET AT BEDTIME AS NEEDED SLEEP 30 tablet 1   Azelastine HCl 137 MCG/SPRAY SOLN PLACE 2 SPRAYS INTO BOTH NOSTRILS 2 (TWO) TIMES DAILY. USE IN EACH NOSTRIL AS DIRECTED 30 mL 1   No facility-administered medications prior to visit.    Allergies  Allergen Reactions   Celebrex [Celecoxib] Itching, Nausea Only and Other (See Comments)    Gas, hair loss   Fluoxetine Hcl Shortness Of Breath and Itching    REACTION: itching,SOB   Amitriptyline     Dry mouth    Contrast Media [Iodinated Diagnostic Agents] Itching    Multilance.    Dexamethasone Diarrhea and Nausea And Vomiting   Livalo [Pitavastatin] Other (See Comments)    Myalgia    Metrizamide Itching    Multilance.    Nitroglycerin     Nausea,dizziness,sweats   Rosuvastatin Other (See Comments)    bodyaches and cramping   Simvastatin Other (See Comments)    Muscle aches and HA   Codeine Sulfate Itching    REACTION: itching   Sulfasalazine Nausea Only    REACTION: Nausea   Sulfonamide Derivatives Nausea Only    REACTION: Nausea    ROS Review of Systems    Objective:    Physical Exam Vitals reviewed.  Constitutional:      Appearance: She is well-developed.  HENT:     Head: Normocephalic and atraumatic.  Eyes:     Conjunctiva/sclera: Conjunctivae normal.  Cardiovascular:     Rate and Rhythm: Normal rate.  Pulmonary:     Effort: Pulmonary effort is normal.  Musculoskeletal:     Comments: Mildly tender over the mid thoracic spine underneath the bra strap area and some discomfort over the lower lumbar spine where she has had a previous surgery she is more tender over the right sided  paraspinous muscles over the mid back.  No SI joint tenderness.  I did not palpate any lesions on the left side of her neck.  Skin:    General: Skin is dry.  Neurological:     Mental Status: She is alert and oriented to person, place, and time.  Psychiatric:        Behavior: Behavior normal.    BP (!) 124/55   Pulse 72   Ht $R'5\' 5"'ob$  (1.651 m)   Wt 177 lb (80.3 kg)   SpO2 99%   BMI 29.45 kg/m  Wt Readings from Last 3 Encounters:  12/14/20 177 lb (80.3 kg)  10/17/20 176 lb (79.8 kg)  10/14/20 176 lb 12.8 oz (80.2 kg)     There are no preventive care reminders to display for this patient.  There are no preventive care reminders to display  for this patient.  Lab Results  Component Value Date   TSH 3.08 01/01/2020   Lab Results  Component Value Date   WBC 6.9 01/01/2020   HGB 14.0 01/01/2020   HCT 41.1 01/01/2020   MCV 87.1 01/01/2020   PLT 278 01/01/2020   Lab Results  Component Value Date   NA 139 12/07/2020   K 4.5 12/07/2020   CO2 26 12/07/2020   GLUCOSE 105 (H) 12/07/2020   BUN 13 12/07/2020   CREATININE 0.84 12/07/2020   BILITOT 0.4 12/07/2020   ALKPHOS 96 12/07/2020   AST 31 12/07/2020   ALT 28 12/07/2020   PROT 6.4 12/07/2020   ALBUMIN 4.2 12/07/2020   CALCIUM 9.6 12/07/2020   ANIONGAP 9 03/07/2016   EGFR 71 12/07/2020   Lab Results  Component Value Date   CHOL 185 12/07/2020   Lab Results  Component Value Date   HDL 48 12/07/2020   Lab Results  Component Value Date   LDLCALC 109 (H) 12/07/2020   Lab Results  Component Value Date   TRIG 161 (H) 12/07/2020   Lab Results  Component Value Date   CHOLHDL 3.9 12/07/2020   Lab Results  Component Value Date   HGBA1C 5.7 (A) 08/13/2020      Assessment & Plan:   Problem List Items Addressed This Visit       Digestive   Pharyngoesophageal dysphagia    She having difficulty swallowing bread and meat which she does not eat often but when she does it is difficult.  We will go ahead and  refer to GI for further evaluation.  Consider possible esophageal stricture.      Relevant Orders   Ambulatory referral to Gastroenterology   Other Visit Diagnoses     Injury of back due to fall, initial encounter    -  Primary   Relevant Orders   DG Thoracic Spine W/Swimmers   DG Lumbar Spine Complete   Mid back pain       Relevant Orders   DG Thoracic Spine W/Swimmers   DG Lumbar Spine Complete   Acute midline low back pain without sciatica       Relevant Orders   DG Thoracic Spine W/Swimmers   DG Lumbar Spine Complete   Neck pain       Relevant Orders   US Soft Tissue Head/Neck (NON-THYROID)       Injury back, status post fall-since it has been a month and she still having significant discomfort and pain I would like to start with plain film x-rays to make sure that she has not had a fracture recently that may be contributing.  May [place] a compression fracture.  We will call with results once available if negative then consider trial of prednisone and muscle relaxers as well as physical therapy for her back.  Left neck pain - In regards to the pain on the left side of her neck I did not palpate a discrete lesion.  But we will start with a plain ultrasound just to look for any lymphadenopathy etc.    No orders of the defined types were placed in this encounter.   Follow-up: No follow-ups on file.    [name], MD

## 2020-12-14 NOTE — Assessment & Plan Note (Signed)
She having difficulty swallowing bread and meat which she does not eat often but when she does it is difficult.  We will go ahead and refer to GI for further evaluation.  Consider possible esophageal stricture.

## 2020-12-14 NOTE — Progress Notes (Signed)
Hi Cheryl [name] you do have a normal looking Lymph nose in that area.  They don't see anything worrisome. Let plan to refer you to GI for the swallowing issues

## 2020-12-15 ENCOUNTER — Encounter: Payer: Self-pay | Admitting: Family Medicine

## 2020-12-15 ENCOUNTER — Other Ambulatory Visit: Payer: Self-pay | Admitting: Family Medicine

## 2020-12-15 DIAGNOSIS — M545 Low back pain, unspecified: Secondary | ICD-10-CM

## 2020-12-15 DIAGNOSIS — F419 Anxiety disorder, unspecified: Secondary | ICD-10-CM

## 2020-12-15 DIAGNOSIS — F32A Depression, unspecified: Secondary | ICD-10-CM

## 2020-12-15 DIAGNOSIS — G4700 Insomnia, unspecified: Secondary | ICD-10-CM

## 2020-12-15 NOTE — Progress Notes (Signed)
Hi and, the lumbar x-ray shows the surgical changes at L4-5.  And you do have some moderate arthritis at L3-4 just above that.  But the hardware does look intact.  And again no sign of acute fracture.  I think the best action at this point would be to consider formal physical therapy for your back.  If you are okay with that then please let me know and we can place a referral.  We can try to find something close to home.

## 2020-12-15 NOTE — Progress Notes (Signed)
Hi and, great news no sign of fracture or significant injury in your upper back.  Please see the notes for the lower back imaging.  You do have a little bit of curvature which would cause scoliosis.  This can happen as you age but is not from an injury.

## 2020-12-18 ENCOUNTER — Ambulatory Visit: Payer: PPO | Admitting: Rehabilitative and Restorative Service Providers"

## 2020-12-22 ENCOUNTER — Other Ambulatory Visit: Payer: Self-pay

## 2020-12-22 ENCOUNTER — Ambulatory Visit: Payer: PPO | Admitting: Physical Therapy

## 2020-12-22 ENCOUNTER — Telehealth: Payer: PPO

## 2020-12-22 ENCOUNTER — Ambulatory Visit: Payer: PPO | Attending: Family Medicine | Admitting: Physical Therapy

## 2020-12-22 ENCOUNTER — Encounter: Payer: Self-pay | Admitting: Physical Therapy

## 2020-12-22 DIAGNOSIS — M6281 Muscle weakness (generalized): Secondary | ICD-10-CM | POA: Diagnosis not present

## 2020-12-22 DIAGNOSIS — Z9181 History of falling: Secondary | ICD-10-CM | POA: Insufficient documentation

## 2020-12-22 DIAGNOSIS — G4489 Other headache syndrome: Secondary | ICD-10-CM | POA: Insufficient documentation

## 2020-12-22 DIAGNOSIS — M545 Low back pain, unspecified: Secondary | ICD-10-CM | POA: Diagnosis not present

## 2020-12-22 DIAGNOSIS — G8929 Other chronic pain: Secondary | ICD-10-CM | POA: Diagnosis not present

## 2020-12-22 DIAGNOSIS — M542 Cervicalgia: Secondary | ICD-10-CM | POA: Insufficient documentation

## 2020-12-22 DIAGNOSIS — M546 Pain in thoracic spine: Secondary | ICD-10-CM | POA: Insufficient documentation

## 2020-12-22 NOTE — Therapy (Signed)
[facility] @ [address] [place], [place], [address] Phone: [phone]   Fax:  [phone]  Physical Therapy Evaluation  Patient Details  Name: Cheryl [name] MRN: [id number] Date of Birth: 1940-09-13 Referring Provider (PT): Hali Marry, MD   Encounter Date: 12/22/2020   PT End of Session - 12/22/20 0957     Visit Number 1    Date for PT Re-Evaluation 02/16/21    Authorization Type [employer]    Progress Note Due on Visit 10    PT Start Time 0850    PT Stop Time 0940    PT Time Calculation (min) 50 min    Activity Tolerance Patient tolerated treatment well    Behavior During Therapy [facility] for tasks assessed/performed             Past Medical History:  Diagnosis Date   Anemia    Anxiety    d/t recent family issues   Arthritis    Bone spur    Carotid stenosis, bilateral 06/06/2019   Chronic back pain    stenosis and OA   Depression    takes Paxil daily   Diverticulosis    Dizzy    occasionally   Encephalitis    at 9 months ago    Fatty liver    has had 2 Hep shots and the final one is in June 18   GERD (gastroesophageal reflux disease)    takes Omeprazole daily   Hepatitis 2012   History of bronchitis    20-25 yrs ago   History of colon polyps    precancerous   History of shingles    Hyperlipidemia    takes Simvastatin daily   IBS (irritable bowel syndrome)    more on side of constipation-takes Miralax daily   Insomnia    takes Melatonin and Ambien nightly   Internal hemorrhoids    Joint pain    Joint swelling    Lung nodules    calcified    Migraine headache    migraines-thinks coming from neck.Last one1/28/18   Pneumonia    hx of-20 yrs ago-=-walking   Renal cyst    left   Uterine cancer ([place]) 1973    Past Surgical History:  Procedure Laterality Date   APPENDECTOMY  1966   cataracts     bilateral   COLONOSCOPY  09/04/2020   EUS  02/10/2011   Procedure:  UPPER ENDOSCOPIC ULTRASOUND (EUS) RADIAL;  Surgeon: Owens Loffler, MD;  Location: [facility];  Service: Endoscopy;  Laterality: N/A;  radial linear    LUMBAR FUSION  03/2016   L4-5, Dr. Ola Spurr.   RECTOCELE REPAIR  2011   ROTATOR CUFF REPAIR  2009   left   TONSILLECTOMY     UPPER GASTROINTESTINAL ENDOSCOPY     VAGINAL HYSTERECTOMY  1973    There were no vitals filed for this visit.    Subjective Assessment - 12/22/20 0848     Subjective Pt sustained a fall on 11/16/20 when getting out of her recliner, landing on her back and hitting her head.  Imaging negative for Fx or head injury.  Pain is worse with static standing activities like kitchen tasks after 15 min and better with using heating pad.  Pt reports central and bil LBP and intrascapular pain bil.  Pt has history of L4/5 fusion with hardware intact.    Pertinent History Hx of lumbar fusion L4/5 with maintained hardware, min scoliosis  Limitations Standing    How long can you sit comfortably? uses recliner    How long can you stand comfortably? 15 min    How long can you walk comfortably? depends on the day    Diagnostic tests lumbar and thoracic xrays: L4/5 fusion intact hardware, mod degen L3/4, min scoliosis    Patient Stated Goals get rid of pain, be able to work in kitchen without pain, household chores (vaccuum, mop)    Currently in Pain? Yes    Pain Score 4     Pain Location Back    Pain Orientation Mid;Left;Right;Lower    Pain Descriptors / Indicators Dull    Pain Type Chronic pain;Acute pain    Pain Onset More than a month ago    Pain Frequency Intermittent    Aggravating Factors  static standing    Pain Relieving Factors heating pad    Effect of Pain on Daily Activities limited housework and kitchen tasks    Multiple Pain Sites Yes    Pain Score 4    Pain Location Back    Pain Orientation Upper;Left;Right    Pain Descriptors / Indicators Aching    Pain Type Acute pain;Chronic pain    Pain Radiating  Towards intrascapular and below scapulae    Pain Onset More than a month ago    Pain Frequency Intermittent    Aggravating Factors  static standing    Pain Relieving Factors heat                [facility] PT Assessment - 12/22/20 0001       Assessment   Medical Diagnosis M54.50 (ICD-10-CM) - Acute midline low back pain without sciatica    Referring Provider (PT) Hali Marry, MD    Onset Date/Surgical Date 11/16/20    Next MD Visit as needed    Prior Therapy yes, for her neck      Precautions   Precautions Fall      Balance Screen   Has the patient fallen in the past 6 months Yes    How many times? 1    Has the patient had a decrease in activity level because of a fear of falling?  No    Is the patient reluctant to leave their home because of a fear of falling?  No      Home Environment   Living Environment Private residence    Living Arrangements Alone    Type of [place] to enter    Entrance Stairs-Number of Steps [place] One level    [place] - 2 wheels;Walker - 4 wheels;Cane - single point    Additional Comments has cane and walkers but doesn't use them      Prior Function   Level of Independence Independent    Vocation Retired    Leisure used to play cards but hurts to sit now      Observation/Other Assessments   Focus on Therapeutic Outcomes (FOTO)  44%, goal 57%      Functional Tests   Functional tests Single leg stance      Single Leg Stance   Comments unable to balance or atttempt to balance in SLS without UE support      Posture/Postural Control   Posture/Postural Control Postural limitations    Postural Limitations Forward head;Increased thoracic kyphosis;Decreased lumbar lordosis      ROM / Strength   AROM / PROM /  Strength AROM;Strength      AROM   AROM Assessment Site Lumbar    Lumbar Flexion full, painfree    Lumbar Extension 0    Lumbar - Right Side Bend 10, painfree    Lumbar - Left  Side Bend 10, painfree    Lumbar - Right Rotation limited 50%    Lumbar - Left Rotation limited 50%      Strength   Overall Strength Comments LEs 4/5 with exception of Lt knee ext 4-/5, scapular strength 4-/5 bil, core 4-/5    Strength Assessment Site Knee    Right/Left Knee Left    Left Knee Extension 4-/5      Flexibility   Soft Tissue Assessment /Muscle Length yes    Hamstrings limited Rt>Lt 30%    Piriformis limited 25% bil      Palpation   Spinal mobility limited C/T junction extension, thoracic PAs limited upper > lower    SI assessment  tender with overlying atrophy of glut max bil    Palpation comment bil SI joints, intrascapular bil, lumbar non-tender today      Transfers   Five time sit to stand comments  22 sec, bil UE use      Ambulation/Gait   Ambulation/Gait Yes    Assistive device None    Gait Pattern Decreased stride length;Step-through pattern      Standardized Balance Assessment   Standardized Balance Assessment Five Times Sit to Stand    Five times sit to stand comments  22 sec with UE use                        Objective measurements completed on examination: See above findings.                PT Education - 12/22/20 0943     Education Details Access Code: [email or web address]    Person(s) Educated Patient    Methods Explanation;Demonstration;Handout    Comprehension Verbalized understanding;Returned demonstration              PT Short Term Goals - 12/22/20 1017       PT SHORT TERM GOAL #1   Title Pt will be ind with initial HEP without exacerbation of pain    Time 4    Period Weeks    Status New    Target Date 01/19/21      PT SHORT TERM GOAL #2   Title Pt will be able to demo 5x sit to stand without UE support from elevated surface in 18 sec or less    Time 4    Period Weeks    Status New    Target Date 01/19/21      PT SHORT TERM GOAL #3   Title Pt will be able to perform 20 min of standing kitchen tasks before  needing seated break.    Baseline -    Time 4    Period Weeks    Status New    Target Date 01/19/21      PT SHORT TERM GOAL #4   Title Pt will achieve at least 4/5 strength of Lt quad and tibialis anterior    Time 4    Period Weeks    Status New    Target Date 01/19/21      PT SHORT TERM GOAL #5   Title Pt will perform Rt/Lt SLS at counter with no more than single UE support x 20 sec  Time 4    Period Weeks    Status New    Target Date 01/19/21               PT Long Term Goals - 12/22/20 1021       PT LONG TERM GOAL #1   Title The patient will be independent in safe self progression of HEP    Time 8    Period Weeks    Status New    Target Date 02/16/21      PT LONG TERM GOAL #2   Title Pt will be able to perform standing household tasks such as cooking and light cleaning for up to 30 min before needing seated break.    Baseline -    Time 8    Period Weeks    Status New    Target Date 02/16/21      PT LONG TERM GOAL #3   Title Pt will improve FOTO score to at least 57% (from 44%) to demo improved function.    Time 8    Period Weeks    Status New    Target Date 02/16/21      PT LONG TERM GOAL #4   Title Pt will be able to perform 5x sit to stand from chair without UEs in 16 sec or less    Time 8    Period Weeks    Status New    Target Date 02/16/21      PT LONG TERM GOAL #5   Title Pt will improve postural stabilizers, scapular stabilizers and LE strength to at least 4+/5 for improved stairs and housework tolerance.    Time 8    Period Weeks    Status New    Target Date 02/16/21                    Plan - 12/22/20 0950     Clinical Impression Statement Pt is a pleasant 80yo female who sustained a fall on 11/16/20  when rising from recliner, landing on her back and hitting her head. Imaging was negative.  She has a history of L4/5 fusion with hardware intact.  She reports increased pain with static standing x 15 min needing seated breaks  and uses heat for relief.  She is limited with kitchen tasks and housework due to pain.  Pain reaches 4-5/10 and is located in lumbar region centrally, spreading Lt/Rt, and in bil intrascapular region.  Pt has 3 stairs without railing to enter her one-story home.  Pt presents with forward head and increased thoracic kyphosis.  She is unable to perform lumbar extension ("that way doesn't work anymore") and limited thoracic rotation bil by 50%.  Lumbar flexion and bil SB is full and painfree.  LE flexibility is limited in bil hamstrings by 30% and piriformis by 25%.  Hip ROM is WFL.  Pt is unable to attempt to perform SLS without UE support due to balance deficits.  She requires bil UEs for sit to stand, with 5x sit to stand being 22 sec placing her in fall risk category.  She has LE weakness especially in Lt quad and tibialis anterior, 4-/5.  Core and scapular stabilzer strength is 4-/5.  Pt will benefit from skilled PT to address findings from evaluation and promote more active lifestyle with less pain.    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    Examination-Activity Limitations Transfers;Stairs;Stand;Lift;Squat    Examination-Participation Restrictions Cleaning;Meal Prep;Laundry;Community Activity    Stability/Clinical Decision Making Evolving/Moderate complexity    Clinical Decision Making Moderate    Rehab Potential Good    PT Frequency 2x / week    PT Duration 8 weeks    PT Treatment/Interventions ADLs/Self Care Home Management;Moist Heat;Electrical Stimulation;Stair training;Functional mobility training;Therapeutic activities;Therapeutic exercise;Balance training;Neuromuscular re-education;Patient/family education;Manual techniques;Dry needling    PT Next Visit Plan NuStep, review HEP, supine neck retraction, SLR, SL TA/clam, supine yellow  tband, step ups/downs for stair goal, leg press, standing counter march and hip abd, wall push up    PT Home Exercise Plan Access Code: [email or web address]    Consulted and Agree with Plan of Care Patient             Patient will benefit from skilled therapeutic intervention in order to improve the following deficits and impairments:  Abnormal gait, Decreased range of motion, Decreased strength, Postural dysfunction, Decreased mobility, Impaired flexibility, Decreased balance, Pain, Decreased activity tolerance, Hypomobility  Visit Diagnosis: Chronic bilateral low back pain without sciatica - Plan: PT plan of care cert/re-cert  Pain in thoracic spine - Plan: PT plan of care cert/re-cert  Muscle weakness (generalized) - Plan: PT plan of care cert/re-cert  History of falling - Plan: PT plan of care cert/re-cert     Problem List Patient Active Problem List   Diagnosis Date Noted   Pharyngoesophageal dysphagia 12/14/2020   Osteopenia 10/23/2020   Abnormal glucose 08/13/2020   Chronic migraine without aura, with intractable migraine, so stated, with status migrainosus 07/03/2019   Carotid stenosis, bilateral 06/06/2019   Migraine headache 03/22/2019   Daily headache 03/22/2019   Myofascial pain 03/27/2018   Numbness of left foot 11/07/2017   Right foot pain 09/12/2017   Deviated septum 01/25/2017   ETD (Eustachian tube dysfunction), bilateral 01/25/2017   Seasonal allergic rhinitis 01/25/2017   Bilateral hearing loss 01/25/2017   S/P lumbar fusion 06/20/2016   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   Fatty liver 12/22/2015   Onychodystrophy 11/24/2015   Prepatellar bursitis of right knee 08/18/2014   Hyperlipidemia 12/17/2013   Osteoarthritis of left knee 07/05/2013   Obesity 03/10/2011   Dyspepsia 02/10/2011   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   PALPITATIONS 02/11/2010   OSTEOPENIA 11/27/2009   CONSTIPATION, CHRONIC 08/15/2008   Chronic neck pain  04/11/2008   Disorder of kidney and ureter 01/21/2008   HEMORRHOIDS, INTERNAL 01/16/2008   DIVERTICULOSIS, COLON 01/16/2008   POSTMENOPAUSAL STATUS 12/05/2007   MICROSCOPIC HEMATURIA 06/05/2007   STENOSIS, LUMBAR SPINE 12/22/2005   Anxiety and depression 11/15/2005   IRRITABLE BOWEL SYNDROME 11/15/2005   ARTHRITIS 11/15/2005    Baruch Merl, PT 12/22/20 10:26 AM  [facility] @ [address] [place], [place], [address] Phone: [phone]   Fax:  [phone]  Name: Runette Scifres MRN: [id number] Date of Birth: 27-May-1940

## 2020-12-22 NOTE — Patient Instructions (Signed)
Access Code: [email or web address] URL: [email or web address] Date: 12/22/2020 Prepared by: Venetia Night Anairis Knick  Exercises Seated Hamstring Stretch - 1 x daily - 7 x weekly - 1 sets - 2 reps - 20 hold Seated Figure 4 Piriformis Stretch - 1 x daily - 7 x weekly - 1 sets - 2 reps - 20 hold Seated Long Arc Quad - 2 x daily - 7 x weekly - 2 sets - 10 reps Seated Toe Raise - 2 x daily - 7 x weekly - 1 sets - 10 reps Standing Row with Anchored Resistance - 2 x daily - 7 x weekly - 2 sets - 10 reps Standing Single Leg Stance with Counter Support - 2 x daily - 7 x weekly - 1 sets - 1 reps - 10 hold Heel Raise - 2 x daily - 7 x weekly - 1 sets - 10 reps

## 2020-12-24 ENCOUNTER — Other Ambulatory Visit: Payer: Self-pay

## 2020-12-24 ENCOUNTER — Ambulatory Visit: Payer: PPO

## 2020-12-24 DIAGNOSIS — M542 Cervicalgia: Secondary | ICD-10-CM

## 2020-12-24 DIAGNOSIS — G8929 Other chronic pain: Secondary | ICD-10-CM

## 2020-12-24 DIAGNOSIS — M545 Low back pain, unspecified: Secondary | ICD-10-CM | POA: Diagnosis not present

## 2020-12-24 DIAGNOSIS — M6281 Muscle weakness (generalized): Secondary | ICD-10-CM

## 2020-12-24 DIAGNOSIS — M546 Pain in thoracic spine: Secondary | ICD-10-CM

## 2020-12-24 DIAGNOSIS — G4489 Other headache syndrome: Secondary | ICD-10-CM

## 2020-12-24 DIAGNOSIS — Z9181 History of falling: Secondary | ICD-10-CM

## 2020-12-24 NOTE — Therapy (Signed)
[facility] @ [address] [place], [place], [address] Phone: [phone]   Fax:  [phone]  Physical Therapy Treatment  Patient Details  Name: Cheryl [name] MRN: [id number] Date of Birth: 07-May-1940 Referring Provider (PT): Hali Marry, MD   Encounter Date: 12/24/2020   PT End of Session - 12/24/20 1034     Visit Number 2    Date for PT Re-Evaluation 02/16/21    Authorization Type [employer]    Progress Note Due on Visit 10    PT Start Time 1015    PT Stop Time 1059    PT Time Calculation (min) 44 min    Activity Tolerance Patient tolerated treatment well    Behavior During Therapy [facility] for tasks assessed/performed             Past Medical History:  Diagnosis Date   Anemia    Anxiety    d/t recent family issues   Arthritis    Bone spur    Carotid stenosis, bilateral 06/06/2019   Chronic back pain    stenosis and OA   Depression    takes Paxil daily   Diverticulosis    Dizzy    occasionally   Encephalitis    at 9 months ago    Fatty liver    has had 2 Hep shots and the final one is in June 18   GERD (gastroesophageal reflux disease)    takes Omeprazole daily   Hepatitis 2012   History of bronchitis    20-25 yrs ago   History of colon polyps    precancerous   History of shingles    Hyperlipidemia    takes Simvastatin daily   IBS (irritable bowel syndrome)    more on side of constipation-takes Miralax daily   Insomnia    takes Melatonin and Ambien nightly   Internal hemorrhoids    Joint pain    Joint swelling    Lung nodules    calcified    Migraine headache    migraines-thinks coming from neck.Last one1/28/18   Pneumonia    hx of-20 yrs ago-=-walking   Renal cyst    left   Uterine cancer ([place]) 1973    Past Surgical History:  Procedure Laterality Date   APPENDECTOMY  1966   cataracts     bilateral   COLONOSCOPY  09/04/2020   EUS  02/10/2011   Procedure:  UPPER ENDOSCOPIC ULTRASOUND (EUS) RADIAL;  Surgeon: Owens Loffler, MD;  Location: [facility];  Service: Endoscopy;  Laterality: N/A;  radial linear    LUMBAR FUSION  03/2016   L4-5, Dr. Ola Spurr.   RECTOCELE REPAIR  2011   ROTATOR CUFF REPAIR  2009   left   TONSILLECTOMY     UPPER GASTROINTESTINAL ENDOSCOPY     VAGINAL HYSTERECTOMY  1973    There were no vitals filed for this visit.   Subjective Assessment - 12/24/20 1020     Subjective Patient states she is a little more sore today but pain 2/10.  She states she did her exercises this morning.    Pertinent History Hx of lumbar fusion L4/5 with maintained hardware, min scoliosis    Limitations Standing    How long can you sit comfortably? uses recliner    How long can you stand comfortably? 15 min    How long can you walk comfortably? depends on the day    Diagnostic tests lumbar and thoracic xrays: L4/5 fusion intact  hardware, mod degen L3/4, min scoliosis    Patient Stated Goals get rid of pain, be able to work in kitchen without pain, household chores (vaccuum, mop)    Currently in Pain? Yes    Pain Score 2     Pain Location Back    Pain Orientation Mid;Right                               [facility] Adult PT Treatment/Exercise - 12/24/20 0001       Exercises   Exercises Lumbar;Knee/Hip;Neck      Neck Exercises: Machines for Strengthening   Nustep Lev 1 x 5 min      Neck Exercises: Theraband   Rows 10 reps;Limitations    Rows Limitations yellow 2 sets of 10      Neck Exercises: Standing   Neck Retraction 10 reps    Neck Retraction Limitations attempted in supine but unable, did in sitting x 10      Knee/Hip Exercises: Standing   Heel Raises Both;2 sets;10 reps    SLS 10 times ea LE for as long as she can hold                     PT Education - 12/24/20 1038     Education Details Reviewed HEP.  [email or web address]  heavy verbal cues for correct technique and which exercises were strengthening  and which were stretching and when to hold vs. controlled reps.    Person(s) Educated Patient    Methods Explanation;Demonstration;Verbal cues    Comprehension Verbalized understanding;Returned demonstration;Verbal cues required              PT Short Term Goals - 12/22/20 1017       PT SHORT TERM GOAL #1   Title Pt will be ind with initial HEP without exacerbation of pain    Time 4    Period Weeks    Status New    Target Date 01/19/21      PT SHORT TERM GOAL #2   Title Pt will be able to demo 5x sit to stand without UE support from elevated surface in 18 sec or less    Time 4    Period Weeks    Status New    Target Date 01/19/21      PT SHORT TERM GOAL #3   Title Pt will be able to perform 20 min of standing kitchen tasks before needing seated break.    Baseline -    Time 4    Period Weeks    Status New    Target Date 01/19/21      PT SHORT TERM GOAL #4   Title Pt will achieve at least 4/5 strength of Lt quad and tibialis anterior    Time 4    Period Weeks    Status New    Target Date 01/19/21      PT SHORT TERM GOAL #5   Title Pt will perform Rt/Lt SLS at counter with no more than single UE support x 20 sec    Time 4    Period Weeks    Status New    Target Date 01/19/21               PT Long Term Goals - 12/22/20 1021       PT LONG TERM GOAL #1   Title The patient will be independent in safe self  progression of HEP    Time 8    Period Weeks    Status New    Target Date 02/16/21      PT LONG TERM GOAL #2   Title Pt will be able to perform standing household tasks such as cooking and light cleaning for up to 30 min before needing seated break.    Baseline -    Time 8    Period Weeks    Status New    Target Date 02/16/21      PT LONG TERM GOAL #3   Title Pt will improve FOTO score to at least 57% (from 44%) to demo improved function.    Time 8    Period Weeks    Status New    Target Date 02/16/21      PT LONG TERM GOAL #4   Title Pt  will be able to perform 5x sit to stand from chair without UEs in 16 sec or less    Time 8    Period Weeks    Status New    Target Date 02/16/21      PT LONG TERM GOAL #5   Title Pt will improve postural stabilizers, scapular stabilizers and LE strength to at least 4+/5 for improved stairs and housework tolerance.    Time 8    Period Weeks    Status New    Target Date 02/16/21                   Plan - 12/24/20 1029     Clinical Impression Statement Patient needed extensive review on HEP with heavy demonstration and verbal cues for correct technique.  She admits she has "done very little" through the whole time of covid.  She fatigued very easily today.    Personal Factors and Comorbidities Age;Comorbidity 1;Comorbidity 2;Comorbidity 3+;Fitness    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    Examination-Activity Limitations Transfers;Stairs;Stand;Lift;Squat    Examination-Participation Restrictions Cleaning;Meal Prep;Laundry;Community Activity    Stability/Clinical Decision Making Evolving/Moderate complexity    Clinical Decision Making Moderate    Rehab Potential Good    PT Frequency 2x / week    PT Duration 8 weeks    PT Treatment/Interventions ADLs/Self Care Home Management;Moist Heat;Electrical Stimulation;Stair training;Functional mobility training;Therapeutic activities;Therapeutic exercise;Balance training;Neuromuscular re-education;Patient/family education;Manual techniques;Dry needling    PT Next Visit Plan Patient will likely need review once again for HEP.  Progress core strength.  Revised hamstring stretch if unable to feel stretch.    PT Home Exercise Plan Access Code: [email or web address]    Consulted and Agree with Plan of Care Patient             Patient will benefit from skilled therapeutic intervention in order to improve the following deficits and impairments:  Abnormal gait, Decreased range of motion,  Decreased strength, Postural dysfunction, Decreased mobility, Impaired flexibility, Decreased balance, Pain, Decreased activity tolerance, Hypomobility  Visit Diagnosis: Chronic bilateral low back pain without sciatica  Pain in thoracic spine  Muscle weakness (generalized)  History of falling  Cervicalgia  Other headache syndrome     Problem List Patient Active Problem List   Diagnosis Date Noted   Pharyngoesophageal dysphagia 12/14/2020   Osteopenia 10/23/2020   Abnormal glucose 08/13/2020   Chronic migraine without aura, with intractable migraine, so stated, with status migrainosus 07/03/2019   Carotid stenosis, bilateral 06/06/2019   Migraine headache 03/22/2019   Daily headache 03/22/2019  Myofascial pain 03/27/2018   Numbness of left foot 11/07/2017   Right foot pain 09/12/2017   Deviated septum 01/25/2017   ETD (Eustachian tube dysfunction), bilateral 01/25/2017   Seasonal allergic rhinitis 01/25/2017   Bilateral hearing loss 01/25/2017   S/P lumbar fusion 06/20/2016   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   Fatty liver 12/22/2015   Onychodystrophy 11/24/2015   Prepatellar bursitis of right knee 08/18/2014   Hyperlipidemia 12/17/2013   Osteoarthritis of left knee 07/05/2013   Obesity 03/10/2011   Dyspepsia 02/10/2011   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 Shalawn Wynder, PT 12/24/2209:06 AM   [facility] @ [address] [place], [place],  [address] Phone: [phone]   Fax:  [phone]  Name: Cheryl [name] MRN: [id number] Date of Birth: 04-Mar-1940

## 2020-12-24 NOTE — Patient Instructions (Signed)
Reviewed HEP.

## 2020-12-29 ENCOUNTER — Other Ambulatory Visit: Payer: Self-pay | Admitting: *Deleted

## 2020-12-29 ENCOUNTER — Encounter (HOSPITAL_BASED_OUTPATIENT_CLINIC_OR_DEPARTMENT_OTHER): Payer: Self-pay | Admitting: Cardiovascular Disease

## 2020-12-29 DIAGNOSIS — Z5181 Encounter for therapeutic drug level monitoring: Secondary | ICD-10-CM

## 2020-12-29 DIAGNOSIS — I10 Essential (primary) hypertension: Secondary | ICD-10-CM

## 2020-12-29 DIAGNOSIS — E7800 Pure hypercholesterolemia, unspecified: Secondary | ICD-10-CM

## 2021-01-05 ENCOUNTER — Encounter: Payer: Self-pay | Admitting: Physical Therapy

## 2021-01-05 ENCOUNTER — Ambulatory Visit: Payer: PPO | Admitting: Physical Therapy

## 2021-01-05 ENCOUNTER — Other Ambulatory Visit: Payer: Self-pay

## 2021-01-05 DIAGNOSIS — M6281 Muscle weakness (generalized): Secondary | ICD-10-CM

## 2021-01-05 DIAGNOSIS — M546 Pain in thoracic spine: Secondary | ICD-10-CM

## 2021-01-05 DIAGNOSIS — M545 Low back pain, unspecified: Secondary | ICD-10-CM | POA: Diagnosis not present

## 2021-01-05 DIAGNOSIS — Z9181 History of falling: Secondary | ICD-10-CM

## 2021-01-05 DIAGNOSIS — G8929 Other chronic pain: Secondary | ICD-10-CM

## 2021-01-05 NOTE — Therapy (Signed)
[facility] @ [address] [place], [place], [address] Phone: [phone]   Fax:  [phone]  Physical Therapy Treatment  Patient Details  Name: Cheryl [name] MRN: [id number] Date of Birth: [birth year] Referring Provider (PT): Hali Marry, MD   Encounter Date: 01/05/2021   PT End of Session - 01/05/21 0846     Visit Number 3    Date for PT Re-Evaluation 02/16/21    Authorization Type [employer]    Progress Note Due on Visit 10    PT Start Time 0846    PT Stop Time 0930    PT Time Calculation (min) 44 min    Activity Tolerance Patient tolerated treatment well    Behavior During Therapy [facility] for tasks assessed/performed             Past Medical History:  Diagnosis Date   Anemia    Anxiety    d/t recent family issues   Arthritis    Bone spur    Carotid stenosis, bilateral 06/06/2019   Chronic back pain    stenosis and OA   Depression    takes Paxil daily   Diverticulosis    Dizzy    occasionally   Encephalitis    at 9 months ago    Fatty liver    has had 2 Hep shots and the final one is in June 18   GERD (gastroesophageal reflux disease)    takes Omeprazole daily   Hepatitis 2012   History of bronchitis    20-25 yrs ago   History of colon polyps    precancerous   History of shingles    Hyperlipidemia    takes Simvastatin daily   IBS (irritable bowel syndrome)    more on side of constipation-takes Miralax daily   Insomnia    takes Melatonin and Ambien nightly   Internal hemorrhoids    Joint pain    Joint swelling    Lung nodules    calcified    Migraine headache    migraines-thinks coming from neck.Last one1/28/18   Pneumonia    hx of-20 yrs ago-=-walking   Renal cyst    left   Uterine cancer ([place]) 1973    Past Surgical History:  Procedure Laterality Date   APPENDECTOMY  1966   cataracts     bilateral   COLONOSCOPY  09/04/2020   EUS  02/10/2011   Procedure:  UPPER ENDOSCOPIC ULTRASOUND (EUS) RADIAL;  Surgeon: Owens Loffler, MD;  Location: [facility];  Service: Endoscopy;  Laterality: N/A;  radial linear    LUMBAR FUSION  03/2016   L4-5, Dr. Ola Spurr.   RECTOCELE REPAIR  2011   ROTATOR CUFF REPAIR  2009   left   TONSILLECTOMY     UPPER GASTROINTESTINAL ENDOSCOPY     VAGINAL HYSTERECTOMY  1973    There were no vitals filed for this visit.   Subjective Assessment - 01/05/21 0846     Subjective I was sore the entire next day after last visit.    Pertinent History Hx of lumbar fusion L4/5 with maintained hardware, min scoliosis    Limitations Standing    How long can you sit comfortably? uses recliner    How long can you stand comfortably? 15 min    How long can you walk comfortably? depends on the day    Diagnostic tests lumbar and thoracic xrays: L4/5 fusion intact hardware, mod degen L3/4, min scoliosis    Patient Stated  Goals get rid of pain, be able to work in kitchen without pain, household chores (vaccuum, mop)    Currently in Pain? Yes    Pain Score 1     Pain Location Back    Pain Orientation Right;Left;Lower;Mid;Upper    Pain Descriptors / Indicators Aching;Dull    Pain Type Chronic pain;Acute pain                               [facility] Adult PT Treatment/Exercise - 01/05/21 0001       Exercises   Exercises Knee/Hip;Shoulder      Neck Exercises: Machines for Strengthening   Nustep L3 seat 6 x 5', new model   PT present to monitor     Lumbar Exercises: Stretches   Single Knee to Chest Stretch Left;Right;1 rep;30 seconds    Lower Trunk Rotation 5 reps    Lower Trunk Rotation Limitations bil      Knee/Hip Exercises: Stretches   Piriformis Stretch Both;1 rep;30 seconds    Piriformis Stretch Limitations supine      Knee/Hip Exercises: Standing   Heel Raises Both;1 set;10 reps      Knee/Hip Exercises: Seated   Long Arc Quad AROM;Both;1 set;10 reps      Knee/Hip Exercises: Supine   Bridges 5  reps    Bridges Limitations 5" hold      Shoulder Exercises: Supine   Horizontal ABduction Strengthening;Theraband    External Rotation Strengthening;Both;10 reps;Theraband    Theraband Level (Shoulder External Rotation) Level 1 (Yellow)    Diagonals Strengthening;Both;Theraband    Theraband Level (Shoulder Diagonals) Level 1 (Yellow)    Diagonals Limitations 3 reps each way      Modalities   Modalities Moist Heat      Moist Heat Therapy   Number Minutes Moist Heat 10 Minutes    Moist Heat Location Lumbar Spine   concurrent with supine ther ex     Manual Therapy   Manual Therapy Joint mobilization;Soft tissue mobilization    Joint Mobilization thoracic PAs and rib springing mid-thoracic Gr II/III, prone    Soft tissue mobilization bil lumbar paraspinals, QL, lower and mid-thoracic paraspinals lengthening and broadening, prone                 Balance Exercises - 01/05/21 0001       Balance Exercises: Standing   Marching Solid surface;Upper extremity assist 2;Forwards;Retro   3 reps in parallel bars                 PT Short Term Goals - 01/05/21 0943       PT SHORT TERM GOAL #1   Title Pt will be ind with initial HEP without exacerbation of pain    Status Achieved               PT Long Term Goals - 12/22/20 1021       PT LONG TERM GOAL #1   Title The patient will be independent in safe self progression of HEP    Time 8    Period Weeks    Status New    Target Date 02/16/21      PT LONG TERM GOAL #2   Title Pt will be able to perform standing household tasks such as cooking and light cleaning for up to 30 min before needing seated break.    Baseline -    Time 8    Period Weeks  Status New    Target Date 02/16/21      PT LONG TERM GOAL #3   Title Pt will improve FOTO score to at least 57% (from 44%) to demo improved function.    Time 8    Period Weeks    Status New    Target Date 02/16/21      PT LONG TERM GOAL #4   Title Pt will be  able to perform 5x sit to stand from chair without UEs in 16 sec or less    Time 8    Period Weeks    Status New    Target Date 02/16/21      PT LONG TERM GOAL #5   Title Pt will improve postural stabilizers, scapular stabilizers and LE strength to at least 4+/5 for improved stairs and housework tolerance.    Time 8    Period Weeks    Status New    Target Date 02/16/21                   Plan - 01/05/21 4742     Clinical Impression Statement Pt reported soreness all over the day after last session, feeling like it was too much.  Soreness subsided after 1 day and Pt was able to return to HEP.  Pt has return of daily headaches since her fall.  She had been very inactive due to covid community shut down x 2 years so will need to be closely monitored for tolerance of increased activity level as it is introduced.  PT had Pt cut back on NuStep time and perform postural strength in supine today to see if more tolerated.  Pt fatigues easily so reps kept low.  Pt has signif hypomobility of t-spine and soft tissue tension along paraspinals bil from mid-thoracic to lumbar region so STM and joint mobs performed today in prone.  Heat used concurrently with supine ther ex for stiffness related pain.  PT closely monitored form and tolerance of today's ther ex and manual techniques.  Continue along POC.    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    Rehab Potential Good    PT Frequency 2x / week    PT Duration 8 weeks    PT Treatment/Interventions ADLs/Self Care Home Management;Moist Heat;Electrical Stimulation;Stair training;Functional mobility training;Therapeutic activities;Therapeutic exercise;Balance training;Neuromuscular re-education;Patient/family education;Manual techniques;Dry needling    PT Next Visit Plan NuStep x 5', f/u on supine yellow tband (horiz abd, diag, ER) and bridge 5x5" holds (updated HEP if well tolerated after last  visit), heat with supine stretches, cervical and thoracic manual therapy for return of headaches, try adding seated thoracic rot, standing rockerboard, rebounder weight shifts    PT Home Exercise Plan Access Code: [email or web address]    Consulted and Agree with Plan of Care Patient             Patient will benefit from skilled therapeutic intervention in order to improve the following deficits and impairments:     Visit Diagnosis: Chronic bilateral low back pain without sciatica  Pain in thoracic spine  Muscle weakness (generalized)  History of falling     Problem List Patient Active Problem List   Diagnosis Date Noted   Pharyngoesophageal dysphagia 12/14/2020   Osteopenia 10/23/2020   Abnormal glucose 08/13/2020   Chronic migraine without aura, with intractable migraine, so stated, with status migrainosus 07/03/2019   Carotid stenosis, bilateral 06/06/2019   Migraine headache 03/22/2019  Daily headache 03/22/2019   Myofascial pain 03/27/2018   Numbness of left foot 11/07/2017   Right foot pain 09/12/2017   Deviated septum 01/25/2017   ETD (Eustachian tube dysfunction), bilateral 01/25/2017   Seasonal allergic rhinitis 01/25/2017   Bilateral hearing loss 01/25/2017   S/P lumbar fusion 06/20/2016   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   Fatty liver 12/22/2015   Onychodystrophy 11/24/2015   Prepatellar bursitis of right knee 08/18/2014   Hyperlipidemia 12/17/2013   Osteoarthritis of left knee 07/05/2013   Obesity 03/10/2011   Dyspepsia 02/10/2011   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 @ [address] [place], [place], [address] Phone: [phone]   Fax:  [phone]  Name: Cheryl [name] MRN: [id number] Date of Birth: 07/14/1940

## 2021-01-06 ENCOUNTER — Telehealth: Payer: Self-pay | Admitting: Adult Health

## 2021-01-06 NOTE — Telephone Encounter (Signed)
Pt called thinks she needs to change her medication Fremanezumab-vfrm (AJOVY) 225 MG/1.5ML SOAJ. Pt thinks the it is no longer helping her with he migraines. Pt requesting a call back.

## 2021-01-06 NOTE — Telephone Encounter (Signed)
Pt called stated her migraines have gotten so much worse, she states does not feel like this ajovy has helped since she has been on it.  She did have a fall 11/2020 and feels that her neck  (soreness) has caused the increase (has always felt is has).  She is getting what sounds like PT assistance for ajovy.  I relayed that can make sooner appt 03-01-21 with Dr Jaynee Eagles (first available) and placed on waitlist for med management.  Will keep look out for Cancellations.  She appreciated this.

## 2021-01-11 ENCOUNTER — Encounter: Payer: PPO | Admitting: Family Medicine

## 2021-01-12 ENCOUNTER — Other Ambulatory Visit: Payer: Self-pay

## 2021-01-12 ENCOUNTER — Ambulatory Visit: Payer: PPO | Attending: Family Medicine | Admitting: Physical Therapy

## 2021-01-12 ENCOUNTER — Encounter: Payer: Self-pay | Admitting: Physical Therapy

## 2021-01-12 DIAGNOSIS — G8929 Other chronic pain: Secondary | ICD-10-CM | POA: Insufficient documentation

## 2021-01-12 DIAGNOSIS — M545 Low back pain, unspecified: Secondary | ICD-10-CM | POA: Insufficient documentation

## 2021-01-12 DIAGNOSIS — Z9181 History of falling: Secondary | ICD-10-CM | POA: Insufficient documentation

## 2021-01-12 DIAGNOSIS — M546 Pain in thoracic spine: Secondary | ICD-10-CM | POA: Insufficient documentation

## 2021-01-12 DIAGNOSIS — M6281 Muscle weakness (generalized): Secondary | ICD-10-CM | POA: Insufficient documentation

## 2021-01-12 DIAGNOSIS — M542 Cervicalgia: Secondary | ICD-10-CM | POA: Insufficient documentation

## 2021-01-12 NOTE — Therapy (Signed)
[facility] @ [address] [place], [place], [address] Phone: [phone]   Fax:  [phone]  Physical Therapy Treatment  Patient Details  Name: Cheryl [name] MRN: [id number] Date of Birth: 08-13-1940 Referring Provider (PT): Hali Marry, MD   Encounter Date: 01/12/2021   PT End of Session - 01/12/21 0848     Visit Number 4    Date for PT Re-Evaluation 02/16/21    Authorization Type [employer]    Progress Note Due on Visit 10    PT Start Time 0800    PT Stop Time 0853    PT Time Calculation (min) 53 min    Activity Tolerance Patient tolerated treatment well    Behavior During Therapy [facility] for tasks assessed/performed             Past Medical History:  Diagnosis Date   Anemia    Anxiety    d/t recent family issues   Arthritis    Bone spur    Carotid stenosis, bilateral 06/06/2019   Chronic back pain    stenosis and OA   Depression    takes Paxil daily   Diverticulosis    Dizzy    occasionally   Encephalitis    at 9 months ago    Fatty liver    has had 2 Hep shots and the final one is in June 18   GERD (gastroesophageal reflux disease)    takes Omeprazole daily   Hepatitis 2012   History of bronchitis    20-25 yrs ago   History of colon polyps    precancerous   History of shingles    Hyperlipidemia    takes Simvastatin daily   IBS (irritable bowel syndrome)    more on side of constipation-takes Miralax daily   Insomnia    takes Melatonin and Ambien nightly   Internal hemorrhoids    Joint pain    Joint swelling    Lung nodules    calcified    Migraine headache    migraines-thinks coming from neck.Last one1/28/18   Pneumonia    hx of-20 yrs ago-=-walking   Renal cyst    left   Uterine cancer ([place]) 1973    Past Surgical History:  Procedure Laterality Date   APPENDECTOMY  1966   cataracts     bilateral   COLONOSCOPY  09/04/2020   EUS  02/10/2011   Procedure: UPPER  ENDOSCOPIC ULTRASOUND (EUS) RADIAL;  Surgeon: Owens Loffler, MD;  Location: [facility];  Service: Endoscopy;  Laterality: N/A;  radial linear    LUMBAR FUSION  03/2016   L4-5, Dr. Ola Spurr.   RECTOCELE REPAIR  2011   ROTATOR CUFF REPAIR  2009   left   TONSILLECTOMY     UPPER GASTROINTESTINAL ENDOSCOPY     VAGINAL HYSTERECTOMY  1973    There were no vitals filed for this visit.   Subjective Assessment - 01/12/21 0759     Subjective I did great after last time. I have only had a few days of back pain and that was on days I overdid it.  My migraines have been terrible the last two days so I haven't done my exercises those days.    Pertinent History Hx of lumbar fusion L4/5 with maintained hardware, min scoliosis    Limitations Standing    How long can you sit comfortably? uses recliner    How long can you stand comfortably? 15 min    How  long can you walk comfortably? depends on the day    Diagnostic tests lumbar and thoracic xrays: L4/5 fusion intact hardware, mod degen L3/4, min scoliosis    Patient Stated Goals get rid of pain, be able to work in kitchen without pain, household chores (vaccuum, mop)    Currently in Pain? No/denies    Pain Score 0-No pain    Aggravating Factors  has been able to stand longer in kitchen but then pain increases                               [facility] Adult PT Treatment/Exercise - 01/12/21 0001       Exercises   Exercises Lumbar;Knee/Hip;Shoulder      Lumbar Exercises: Aerobic   Nustep L5 seat 8 arms 11 x 5'      Lumbar Exercises: Seated   Other Seated Lumbar Exercises mat + pad yellow plyo ball x 10 each: hip to hip, hip to opp shoulder, ear to ear      Lumbar Exercises: Supine   Bridge 5 reps      Knee/Hip Exercises: Stretches   Piriformis Stretch Both;Left;Right;1 rep;20 seconds    Piriformis Stretch Limitations supine, fig 4 then pull across 1x20sec each      Knee/Hip Exercises: Seated   Clamshell with TheraBand  Yellow   gave green tied band for HEP   Marching Strengthening;Both;2 sets;10 reps    Marching Limitations 2nd set with green tied band    Sit to [employer] 10 reps   from chair, bil UE use, yellow band for hip abd and VC to avoid knee valgus     Shoulder Exercises: Stretch   Corner Stretch 4 reps;10 seconds    Corner Stretch Limitations doorway, switch step forward foot for gastroc stretch each rep      Moist Heat Therapy   Number Minutes Moist Heat 10 Minutes   end of session and concurrent with manual therapy   Moist Heat Location Lumbar Spine      Manual Therapy   Manual Therapy Joint mobilization;Soft tissue mobilization    Manual therapy comments to improve Pt's ability to correct for forward head for improved overall posture    Joint Mobilization upper cervical ext and SB Rt O/A and A/A joint    Soft tissue mobilization SO release bil                     PT Education - 01/12/21 0831     Education Details added hip stretches and chair ther ex    Person(s) Educated Patient    Methods Explanation;Demonstration;Handout    Comprehension Verbalized understanding;Returned demonstration              PT Short Term Goals - 01/05/21 0943       PT SHORT TERM GOAL #1   Title Pt will be ind with initial HEP without exacerbation of pain    Status Achieved               PT Long Term Goals - 12/22/20 1021       PT LONG TERM GOAL #1   Title The patient will be independent in safe self progression of HEP    Time 8    Period Weeks    Status New    Target Date 02/16/21      PT LONG TERM GOAL #2   Title Pt will be able to  perform standing household tasks such as cooking and light cleaning for up to 30 min before needing seated break.    Baseline -    Time 8    Period Weeks    Status New    Target Date 02/16/21      PT LONG TERM GOAL #3   Title Pt will improve FOTO score to at least 57% (from 44%) to demo improved function.    Time 8    Period Weeks     Status New    Target Date 02/16/21      PT LONG TERM GOAL #4   Title Pt will be able to perform 5x sit to stand from chair without UEs in 16 sec or less    Time 8    Period Weeks    Status New    Target Date 02/16/21      PT LONG TERM GOAL #5   Title Pt will improve postural stabilizers, scapular stabilizers and LE strength to at least 4+/5 for improved stairs and housework tolerance.    Time 8    Period Weeks    Status New    Target Date 02/16/21                   Plan - 01/12/21 0849     Clinical Impression Statement Pt reported she has had days with 0/10 pain in lumbar region since last visit.  She does still alternate sitting and standing tasks due to fatigue and increased pain with standing but notes she can stand longer before needing to sit.  PT encouraged her to note duration of standing tolerance to report next visit.  Pt has weakness in bil hips requiring use of bil UEs for sit to stand.  PT added hip abd strength with band resistance seated today and included cue for hip alignment using hip abd to prevent valgus with both sit to stand and stand to sit.  PT updated HEP for postural stretching (doorway pec and gastroc, supine hips) and seated ther ex for hip and core strength.  Manual therapy performed to improve posture from top down for more ease with cue for neck retraction.  Pt very pleased with progress and will continue to benefit from skilled PT to address deficits and improve tolerance of daily demands.    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    PT Frequency 2x / week    PT Duration 8 weeks    PT Treatment/Interventions ADLs/Self Care Home Management;Moist Heat;Electrical Stimulation;Stair training;Functional mobility training;Therapeutic activities;Therapeutic exercise;Balance training;Neuromuscular re-education;Patient/family education;Manual techniques;Dry needling    PT Next Visit Plan increase  NuStep to L3 x 6', review new seated ther ex, f/u on standing duration tolerance, continue working on functional hip strength (step ups on 2" riser, step over foam pad)    PT Home Exercise Plan Access Code: [email or web address]    Consulted and Agree with Plan of Care Patient             Patient will benefit from skilled therapeutic intervention in order to improve the following deficits and impairments:     Visit Diagnosis: Chronic bilateral low back pain without sciatica  Pain in thoracic spine  Muscle weakness (generalized)  History of falling     Problem List Patient Active Problem List   Diagnosis Date Noted   Pharyngoesophageal dysphagia 12/14/2020   Osteopenia 10/23/2020   Abnormal glucose 08/13/2020   Chronic migraine  without aura, with intractable migraine, so stated, with status migrainosus 07/03/2019   Carotid stenosis, bilateral 06/06/2019   Migraine headache 03/22/2019   Daily headache 03/22/2019   Myofascial pain 03/27/2018   Numbness of left foot 11/07/2017   Right foot pain 09/12/2017   Deviated septum 01/25/2017   ETD (Eustachian tube dysfunction), bilateral 01/25/2017   Seasonal allergic rhinitis 01/25/2017   Bilateral hearing loss 01/25/2017   S/P lumbar fusion 06/20/2016   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   Fatty liver 12/22/2015   Onychodystrophy 11/24/2015   Prepatellar bursitis of right knee 08/18/2014   Hyperlipidemia 12/17/2013   Osteoarthritis of left knee 07/05/2013   Obesity 03/10/2011   Dyspepsia 02/10/2011   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   PALPITATIONS 02/11/2010   OSTEOPENIA 11/27/2009   CONSTIPATION, CHRONIC 08/15/2008   Chronic neck pain 04/11/2008   Disorder of kidney and ureter 01/21/2008   HEMORRHOIDS, INTERNAL 01/16/2008   DIVERTICULOSIS, COLON 01/16/2008   POSTMENOPAUSAL STATUS 12/05/2007   MICROSCOPIC HEMATURIA 06/05/2007   STENOSIS, LUMBAR SPINE 12/22/2005   Anxiety and depression 11/15/2005    IRRITABLE BOWEL SYNDROME 11/15/2005   ARTHRITIS 11/15/2005    Baruch Merl, PT 01/12/21 9:07 AM   [facility] @ [address] [place], [place], [address] Phone: [phone]   Fax:  [phone]  Name: Cheryl [name] MRN: [id number] Date of Birth: 1940-04-15

## 2021-01-12 NOTE — Patient Instructions (Signed)
Access Code: [email or web address] URL: [email or web address] Date: 01/12/2021 Prepared by: Venetia Night Firas Guardado  Exercises Seated Hamstring Stretch - 1 x daily - 7 x weekly - 1 sets - 2 reps - 20 hold Seated Long Arc Quad - 2 x daily - 7 x weekly - 2 sets - 10 reps Seated Toe Raise - 2 x daily - 7 x weekly - 1 sets - 10 reps Standing Row with Anchored Resistance - 2 x daily - 7 x weekly - 2 sets - 10 reps Standing Single Leg Stance with Counter Support - 2 x daily - 7 x weekly - 1 sets - 1 reps - 10 hold Heel Raise - 2 x daily - 7 x weekly - 1 sets - 10 reps Seated March with Resistance - 1 x daily - 7 x weekly - 1 sets - 10 reps Seated Hip Abduction with Resistance - 1 x daily - 7 x weekly - 1 sets - 10 reps Sit to Stand with Armchair - 1 x daily - 7 x weekly - 1 sets - 10 reps Supine Figure 4 Piriformis Stretch - 1 x daily - 7 x weekly - 1 sets - 2 reps - 20 hold Supine Piriformis Stretch with Foot on Ground - 1 x daily - 7 x weekly - 1 sets - 2 reps - 20 hold

## 2021-01-14 ENCOUNTER — Encounter: Payer: PPO | Admitting: Physical Therapy

## 2021-01-14 NOTE — Telephone Encounter (Signed)
Patient reviewed labs with Dr Blenda Mounts comments

## 2021-01-19 ENCOUNTER — Encounter: Payer: PPO | Admitting: Physical Therapy

## 2021-01-19 DIAGNOSIS — H52223 Regular astigmatism, bilateral: Secondary | ICD-10-CM | POA: Diagnosis not present

## 2021-01-19 DIAGNOSIS — Z961 Presence of intraocular lens: Secondary | ICD-10-CM | POA: Diagnosis not present

## 2021-01-19 DIAGNOSIS — H35372 Puckering of macula, left eye: Secondary | ICD-10-CM | POA: Diagnosis not present

## 2021-01-19 DIAGNOSIS — H16223 Keratoconjunctivitis sicca, not specified as Sjogren's, bilateral: Secondary | ICD-10-CM | POA: Diagnosis not present

## 2021-01-21 ENCOUNTER — Encounter: Payer: PPO | Admitting: Physical Therapy

## 2021-01-26 ENCOUNTER — Encounter: Payer: Self-pay | Admitting: Physical Therapy

## 2021-01-26 ENCOUNTER — Other Ambulatory Visit: Payer: Self-pay

## 2021-01-26 ENCOUNTER — Ambulatory Visit: Payer: PPO | Admitting: Physical Therapy

## 2021-01-26 DIAGNOSIS — M6281 Muscle weakness (generalized): Secondary | ICD-10-CM

## 2021-01-26 DIAGNOSIS — G8929 Other chronic pain: Secondary | ICD-10-CM

## 2021-01-26 DIAGNOSIS — M542 Cervicalgia: Secondary | ICD-10-CM

## 2021-01-26 DIAGNOSIS — M546 Pain in thoracic spine: Secondary | ICD-10-CM

## 2021-01-26 DIAGNOSIS — M545 Low back pain, unspecified: Secondary | ICD-10-CM | POA: Diagnosis not present

## 2021-01-26 DIAGNOSIS — Z9181 History of falling: Secondary | ICD-10-CM

## 2021-01-26 NOTE — Therapy (Signed)
[facility] @ [address] [place], [place], [address] Phone: [phone]   Fax:  [phone]  Physical Therapy Treatment  Patient Details  Name: Cheryl [name] MRN: [id number] Date of Birth: [birth year] Referring Provider (PT): Hali Marry, MD   Encounter Date: 01/26/2021   PT End of Session - 01/26/21 0758     Visit Number 5    Date for PT Re-Evaluation 02/16/21    Authorization Type [employer]    Progress Note Due on Visit 10    PT Start Time 0800    PT Stop Time 0843    PT Time Calculation (min) 43 min    Activity Tolerance Patient tolerated treatment well    Behavior During Therapy [facility] for tasks assessed/performed             Past Medical History:  Diagnosis Date   Anemia    Anxiety    d/t recent family issues   Arthritis    Bone spur    Carotid stenosis, bilateral 06/06/2019   Chronic back pain    stenosis and OA   Depression    takes Paxil daily   Diverticulosis    Dizzy    occasionally   Encephalitis    at 9 months ago    Fatty liver    has had 2 Hep shots and the final one is in June 18   GERD (gastroesophageal reflux disease)    takes Omeprazole daily   Hepatitis 2012   History of bronchitis    20-25 yrs ago   History of colon polyps    precancerous   History of shingles    Hyperlipidemia    takes Simvastatin daily   IBS (irritable bowel syndrome)    more on side of constipation-takes Miralax daily   Insomnia    takes Melatonin and Ambien nightly   Internal hemorrhoids    Joint pain    Joint swelling    Lung nodules    calcified    Migraine headache    migraines-thinks coming from neck.Last one1/28/18   Pneumonia    hx of-20 yrs ago-=-walking   Renal cyst    left   Uterine cancer ([place]) 1973    Past Surgical History:  Procedure Laterality Date   APPENDECTOMY  1966   cataracts     bilateral   COLONOSCOPY  09/04/2020   EUS  02/10/2011   Procedure:  UPPER ENDOSCOPIC ULTRASOUND (EUS) RADIAL;  Surgeon: Owens Loffler, MD;  Location: [facility];  Service: Endoscopy;  Laterality: N/A;  radial linear    LUMBAR FUSION  03/2016   L4-5, Dr. Ola Spurr.   RECTOCELE REPAIR  2011   ROTATOR CUFF REPAIR  2009   left   TONSILLECTOMY     UPPER GASTROINTESTINAL ENDOSCOPY     VAGINAL HYSTERECTOMY  1973    There were no vitals filed for this visit.   Subjective Assessment - 01/26/21 0758     Subjective I have been doing the exercises.  I just can't do the sit to stand. I am too weak.  I think the LBP is pretty well resolved.  I am having pain under the shoulder blades.  Daily migraines continue.    Pertinent History Hx of lumbar fusion L4/5 with maintained hardware, min scoliosis    How long can you sit comfortably? uses recliner    How long can you stand comfortably? 15 min    How long can you walk comfortably? depends  on the day    Diagnostic tests lumbar and thoracic xrays: L4/5 fusion intact hardware, mod degen L3/4, min scoliosis    Patient Stated Goals get rid of pain, be able to work in kitchen without pain, household chores (vaccuum, mop)    Currently in Pain? Yes    Pain Score 0-No pain    Pain Location Back    Pain Orientation Mid;Posterior    Pain Descriptors / Indicators Aching;Dull    Pain Type Chronic pain    Pain Onset More than a month ago    Pain Frequency Intermittent                               [facility] Adult PT Treatment/Exercise - 01/26/21 0001       Exercises   Exercises Shoulder;Lumbar;Knee/Hip      Lumbar Exercises: Aerobic   Nustep L5 seat 7 (new model) x 5'      Knee/Hip Exercises: Stretches   Piriformis Stretch Left;Right;1 rep;20 seconds    Piriformis Stretch Limitations supine    Other Knee/Hip Stretches fig 4 with knee hug 1x20" each      Knee/Hip Exercises: Standing   Functional Squat 1 set;10 reps    Functional Squat Limitations counter support    Wall Squat 10 reps       Knee/Hip Exercises: Seated   Clamshell with TheraBand Green   15   Marching Strengthening;Both;1 set;20 reps      Shoulder Exercises: Standing   Horizontal ABduction Strengthening;Both;10 reps;Theraband    Theraband Level (Shoulder Horizontal ABduction) Level 2 (Red)    Horizontal ABduction Limitations tried standing but neck pain, stick with supine    External Rotation Strengthening;Both;15 reps;Theraband    Theraband Level (Shoulder External Rotation) Level 2 (Red)    Extension Strengthening;Both;15 reps;Theraband    Theraband Level (Shoulder Extension) Level 2 (Red)    Row Strengthening;Both;15 reps;Theraband    Theraband Level (Shoulder Row) Level 2 (Red)      Shoulder Exercises: Stretch   Corner Stretch 2 reps;20 seconds    Corner Stretch Limitations doorway                       PT Short Term Goals - 01/26/21 0850       PT SHORT TERM GOAL #1   Title Pt will be ind with initial HEP without exacerbation of pain    Status Achieved      PT SHORT TERM GOAL #2   Title Pt will be able to demo 5x sit to stand without UE support from elevated surface in 18 sec or less    Status On-going      PT SHORT TERM GOAL #3   Title Pt will be able to perform 20 min of standing kitchen tasks before needing seated break.    Status Achieved      PT SHORT TERM GOAL #4   Title Pt will achieve at least 4/5 strength of Lt quad and tibialis anterior    Status On-going      PT SHORT TERM GOAL #5   Title Pt will perform Rt/Lt SLS at counter with no more than single UE support x 20 sec    Status On-going               PT Long Term Goals - 12/22/20 1021       PT LONG TERM GOAL #1   Title The  patient will be independent in safe self progression of HEP    Time 8    Period Weeks    Status New    Target Date 02/16/21      PT LONG TERM GOAL #2   Title Pt will be able to perform standing household tasks such as cooking and light cleaning for up to 30 min before needing  seated break.    Baseline -    Time 8    Period Weeks    Status New    Target Date 02/16/21      PT LONG TERM GOAL #3   Title Pt will improve FOTO score to at least 57% (from 44%) to demo improved function.    Time 8    Period Weeks    Status New    Target Date 02/16/21      PT LONG TERM GOAL #4   Title Pt will be able to perform 5x sit to stand from chair without UEs in 16 sec or less    Time 8    Period Weeks    Status New    Target Date 02/16/21      PT LONG TERM GOAL #5   Title Pt will improve postural stabilizers, scapular stabilizers and LE strength to at least 4+/5 for improved stairs and housework tolerance.    Time 8    Period Weeks    Status New    Target Date 02/16/21                   Plan - 01/26/21 0807     Clinical Impression Statement Pt arrives without pain and report of resolution of LBP.  She notes she can stand longer with less pain, but unsure of exactly how long, maybe 30-60 min.  She has been compliant with HEP but cannot perform sit to stand due to weakness.  PT traded this exercise for counter mini squats and wall squats.  PT progressed UE postural tband from yellow to red and added shoulder ext and ER in standing to focus on standing tolerance with postural stabilization challenge.  Pt noted she can feel her abs kick in.  Her bil piriformis length is improving allowing her to progress fig 4 stretch with UE knee hug within stretch.  Pt will return to clinic in early Jan for ERO with extension likely.    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    PT Frequency 2x / week    PT Duration 8 weeks    PT Treatment/Interventions ADLs/Self Care Home Management;Moist Heat;Electrical Stimulation;Stair training;Functional mobility training;Therapeutic activities;Therapeutic exercise;Balance training;Neuromuscular re-education;Patient/family education;Manual techniques;Dry needling    PT Next Visit  Plan ERO next time, do FOTO, check STGs, review standing red band row, ext, bil ER, counter and wall quarter squats to work on sit to stand strength, try SL clam    PT Home Exercise Plan Access Code: [email or web address]    Consulted and Agree with Plan of Care Patient             Patient will benefit from skilled therapeutic intervention in order to improve the following deficits and impairments:     Visit Diagnosis: Chronic bilateral low back pain without sciatica  Pain in thoracic spine  Muscle weakness (generalized)  History of falling  Cervicalgia     Problem List Patient Active Problem List   Diagnosis Date Noted   Pharyngoesophageal dysphagia 12/14/2020  Osteopenia 10/23/2020   Abnormal glucose 08/13/2020   Chronic migraine without aura, with intractable migraine, so stated, with status migrainosus 07/03/2019   Carotid stenosis, bilateral 06/06/2019   Migraine headache 03/22/2019   Daily headache 03/22/2019   Myofascial pain 03/27/2018   Numbness of left foot 11/07/2017   Right foot pain 09/12/2017   Deviated septum 01/25/2017   ETD (Eustachian tube dysfunction), bilateral 01/25/2017   Seasonal allergic rhinitis 01/25/2017   Bilateral hearing loss 01/25/2017   S/P lumbar fusion 06/20/2016   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   Fatty liver 12/22/2015   Onychodystrophy 11/24/2015   Prepatellar bursitis of right knee 08/18/2014   Hyperlipidemia 12/17/2013   Osteoarthritis of left knee 07/05/2013   Obesity 03/10/2011   Dyspepsia 02/10/2011   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   PALPITATIONS 02/11/2010   OSTEOPENIA 11/27/2009   CONSTIPATION, CHRONIC 08/15/2008   Chronic neck pain 04/11/2008   Disorder of kidney and ureter 01/21/2008   HEMORRHOIDS, INTERNAL 01/16/2008   DIVERTICULOSIS, COLON 01/16/2008   POSTMENOPAUSAL STATUS 12/05/2007   MICROSCOPIC HEMATURIA 06/05/2007   STENOSIS, LUMBAR SPINE 12/22/2005   Anxiety and depression  11/15/2005   IRRITABLE BOWEL SYNDROME 11/15/2005   ARTHRITIS 11/15/2005    Baruch Merl, PT 01/26/21 8:51 AM   [facility] @ [address] [place], [place], [address] Phone: [phone]   Fax:  [phone]  Name: Cheryl [name] MRN: [id number] Date of Birth: 08/28/1940

## 2021-01-28 ENCOUNTER — Encounter: Payer: PPO | Admitting: Physical Therapy

## 2021-02-02 ENCOUNTER — Ambulatory Visit: Payer: PPO | Admitting: Physical Therapy

## 2021-02-04 ENCOUNTER — Encounter: Payer: PPO | Admitting: Physical Therapy

## 2021-02-05 ENCOUNTER — Other Ambulatory Visit: Payer: Self-pay | Admitting: Family Medicine

## 2021-02-05 DIAGNOSIS — F419 Anxiety disorder, unspecified: Secondary | ICD-10-CM

## 2021-02-09 ENCOUNTER — Encounter: Payer: Self-pay | Admitting: Physical Therapy

## 2021-02-09 ENCOUNTER — Other Ambulatory Visit: Payer: Self-pay

## 2021-02-09 ENCOUNTER — Ambulatory Visit: Payer: PPO | Attending: Family Medicine | Admitting: Physical Therapy

## 2021-02-09 DIAGNOSIS — M546 Pain in thoracic spine: Secondary | ICD-10-CM | POA: Diagnosis not present

## 2021-02-09 DIAGNOSIS — G8929 Other chronic pain: Secondary | ICD-10-CM | POA: Diagnosis not present

## 2021-02-09 DIAGNOSIS — M6281 Muscle weakness (generalized): Secondary | ICD-10-CM | POA: Insufficient documentation

## 2021-02-09 DIAGNOSIS — M545 Low back pain, unspecified: Secondary | ICD-10-CM | POA: Diagnosis not present

## 2021-02-09 DIAGNOSIS — Z9181 History of falling: Secondary | ICD-10-CM | POA: Diagnosis not present

## 2021-02-09 NOTE — Therapy (Signed)
[facility] @ [address] [place], [place], [address] Phone: [phone]   Fax:  [phone]  Physical Therapy Treatment  Patient Details  Name: Cheryl [name] MRN: [id number] Date of Birth: [birth year] Referring Provider (PT): Hali Marry, MD   Encounter Date: 02/09/2021   PT End of Session - 02/09/21 0919     Visit Number 6    Date for PT Re-Evaluation 04/06/21    Authorization Type [employer]    Progress Note Due on Visit 10    PT Start Time 0922    PT Stop Time 1015    PT Time Calculation (min) 53 min    Activity Tolerance Patient tolerated treatment well    Behavior During Therapy [facility] for tasks assessed/performed             Past Medical History:  Diagnosis Date   Anemia    Anxiety    d/t recent family issues   Arthritis    Bone spur    Carotid stenosis, bilateral 06/06/2019   Chronic back pain    stenosis and OA   Depression    takes Paxil daily   Diverticulosis    Dizzy    occasionally   Encephalitis    at 9 months ago    Fatty liver    has had 2 Hep shots and the final one is in June 18   GERD (gastroesophageal reflux disease)    takes Omeprazole daily   Hepatitis 2012   History of bronchitis    20-25 yrs ago   History of colon polyps    precancerous   History of shingles    Hyperlipidemia    takes Simvastatin daily   IBS (irritable bowel syndrome)    more on side of constipation-takes Miralax daily   Insomnia    takes Melatonin and Ambien nightly   Internal hemorrhoids    Joint pain    Joint swelling    Lung nodules    calcified    Migraine headache    migraines-thinks coming from neck.Last one1/28/18   Pneumonia    hx of-20 yrs ago-=-walking   Renal cyst    left   Uterine cancer ([place]) 1973    Past Surgical History:  Procedure Laterality Date   APPENDECTOMY  1966   cataracts     bilateral   COLONOSCOPY  09/04/2020   EUS  02/10/2011   Procedure: UPPER  ENDOSCOPIC ULTRASOUND (EUS) RADIAL;  Surgeon: Owens Loffler, MD;  Location: [facility];  Service: Endoscopy;  Laterality: N/A;  radial linear    LUMBAR FUSION  03/2016   L4-5, Dr. Ola Spurr.   RECTOCELE REPAIR  2011   ROTATOR CUFF REPAIR  2009   left   TONSILLECTOMY     UPPER GASTROINTESTINAL ENDOSCOPY     VAGINAL HYSTERECTOMY  1973    There were no vitals filed for this visit.   Subjective Assessment - 02/09/21 0919     Subjective I haven't had any pain.  I can stand as long as I need to now.  I am starting to take outdoor walks x 10 min at a time.  Doing my entire HEP at a time does bring some pain, but generally I don't hurt anymore in my mid and low back.    Pertinent History Hx of lumbar fusion L4/5 with maintained hardware, min scoliosis    Limitations Standing    How long can you sit comfortably? uses recliner  How long can you stand comfortably? as long as I need to now    How long can you walk comfortably? depends on the day    Diagnostic tests lumbar and thoracic xrays: L4/5 fusion intact hardware, mod degen L3/4, min scoliosis    Patient Stated Goals get rid of pain, be able to work in kitchen without pain, household chores (vaccuum, mop)    Currently in Pain? No/denies    Pain Onset More than a month ago    Pain Frequency Occasional    Aggravating Factors  doing entire HEP                [facility] PT Assessment - 02/09/21 0001       Assessment   Medical Diagnosis M54.50 (ICD-10-CM) - Acute midline low back pain without sciatica    Referring Provider (PT) Hali Marry, MD    Onset Date/Surgical Date 11/16/20    Next MD Visit as needed    Prior Therapy yes, for her neck      Precautions   Precautions Fall      Balance Screen   Has the patient fallen in the past 6 months Yes    How many times? 1    Has the patient had a decrease in activity level because of a fear of falling?  No    Is the patient reluctant to leave their home because of a fear  of falling?  No      Observation/Other Assessments   Focus on Therapeutic Outcomes (FOTO)  66%      AROM   AROM Assessment Site Lumbar    Lumbar Flexion full, painfree    Lumbar Extension 5    Lumbar - Right Side Bend 15    Lumbar - Left Side Bend 15    Lumbar - Right Rotation limited 30%    Lumbar - Left Rotation limited 30%      Strength   Overall Strength Comments bil hips 4+/5, core and scapular strength 4/5      Transfers   Five time sit to stand comments  18      Standardized Balance Assessment   Standardized Balance Assessment Five Times Sit to Stand                           [facility] Adult PT Treatment/Exercise - 02/09/21 0001       Self-Care   Self-Care Other Self-Care Comments    Other Self-Care Comments  pacing HEP and daily demands, dividing HEP into half or thirds to cycle through each day, sprinkling HEP into day, limiting HEP on days with house cleaning or errands      Exercises   Exercises Shoulder;Lumbar;Knee/Hip      Lumbar Exercises: Stretches   Other Lumbar Stretch Exercise open books x 5 Rt/Lt, Pt with some dizziness so sticking with seated trunk rotation    Other Lumbar Stretch Exercise seated trunk rotation 3x10" w/ UE overpressure hands on thighs (HEP)      Lumbar Exercises: Aerobic   Nustep L5 seat 8 (new model) x 5'      Knee/Hip Exercises: Stretches   Active Hamstring Stretch Left;Right;1 rep;30 seconds    Active Hamstring Stretch Limitations seated      Knee/Hip Exercises: Standing   SLS 1x20 sec each Rt/Lt with single UE on counter      Knee/Hip Exercises: Seated   Sit to Sand 5 reps   with min  or no UE support today     Knee/Hip Exercises: Supine   Bridges Strengthening;5 reps    Bridges Limitations hold 5 sec      Shoulder Exercises: ROM/Strengthening   Wall Pushups 10 reps                     PT Education - 02/09/21 1024     Education Details added supine bridge, wall push up, sit to stand hands on  thighs, seated thoracic rotation stretch    Person(s) Educated Patient    Methods Explanation;Demonstration;Handout    Comprehension Verbalized understanding;Returned demonstration              PT Short Term Goals - 02/09/21 0948       PT SHORT TERM GOAL #1   Title Pt will be ind with initial HEP without exacerbation of pain    Baseline discussed splitting program in 1/2 as needed    Status Achieved      PT SHORT TERM GOAL #2   Title Pt will be able to demo 5x sit to stand without UE support from elevated surface in 18 sec or less    Baseline 18 sec with UE support    Status Partially Met      PT SHORT TERM GOAL #3   Title Pt will be able to perform 20 min of standing kitchen tasks before needing seated break.    Status Achieved      PT SHORT TERM GOAL #4   Title Pt will achieve at least 4/5 strength of Lt quad and tibialis anterior    Baseline 4+/5    Status Achieved      PT SHORT TERM GOAL #5   Title Pt will perform Rt/Lt SLS at counter with no more than single UE support x 20 sec    Status Achieved               PT Long Term Goals - 02/09/21 0951       PT LONG TERM GOAL #1   Title The patient will be independent in safe self progression of HEP    Status On-going      PT LONG TERM GOAL #2   Title Pt will be able to perform standing household tasks such as cooking and light cleaning for up to 30 min before needing seated break.    Status Achieved      PT LONG TERM GOAL #3   Title Pt will improve FOTO score to at least 57% (from 44%) to demo improved function.    Baseline 66%    Status Achieved      PT LONG TERM GOAL #4   Title Pt will be able to perform 5x sit to stand from chair without UEs in 16 sec or less    Status On-going      PT LONG TERM GOAL #5   Title Pt will improve postural stabilizers, scapular stabilizers and LE strength to at least 4+/5 for improved stairs and housework tolerance.    Baseline improve core and scapular stabilizers, met  for LEs    Status On-going                   Plan - 02/09/21 1027     Clinical Impression Statement Pt is making excellent progress toward STG/LTGs.  Pt states she can now stand as long as she wishes and has started taking outdoor exercise walks x 10 min.  She reports  she has been painfree in mid-back and low back for several weeks.  She has met most STGs and some LTGs, improving 5x sit to stand to 18 sec (from 22 sec) with much less UE support.  FOTO score has improved beyond LTG, reaching 66% (goal 57%).  LE strength has improved to 4+/5 bil.  Pt reports compliance with HEP and feels that some days doing entire HEP along with errands may be too much.  Discussed pacing daily demands and cutting HEP into 1/2 or 1/3 to cycle through each day to build endurance with better tolerance.  She hasn't yet tried previously aggravating activities such as vaccuuming, mopping, and making beds.  PT advanced HEP today to include wall push up, supine bridge, thoracic rotation stretching to improve strength and mobility to work towards these tasks.  PT put sit to stand back onto HEP today since strength has improved to allow for this with less UE use. Pt and PT agreed she is ready to taper to 1x/week with continued compliance with HEP to advance mobility and strength toward remaining functional task performance.    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    Stability/Clinical Decision Making Stable/Uncomplicated    Clinical Decision Making Low    Rehab Potential Excellent    PT Frequency 1x / week    PT Duration 8 weeks    PT Treatment/Interventions ADLs/Self Care Home Management;Moist Heat;Electrical Stimulation;Stair training;Functional mobility training;Therapeutic activities;Therapeutic exercise;Balance training;Neuromuscular re-education;Patient/family education;Manual techniques;Dry needling    PT Next Visit Plan f/u on bridge, wall push  up, sit to stand with less UE support, seated thoracic rotation added last time, f/u on splitting HEP into 1/2 or 1/3 to rotate through each day, try SNAGs, tband diagonals w/ rotation, standing T at counter    PT Home Exercise Plan Access Code: [email or web address]    Consulted and Agree with Plan of Care Patient             Patient will benefit from skilled therapeutic intervention in order to improve the following deficits and impairments:  Abnormal gait, Decreased range of motion, Decreased strength, Postural dysfunction, Decreased mobility, Impaired flexibility, Decreased balance, Pain, Decreased activity tolerance, Hypomobility  Visit Diagnosis: Chronic bilateral low back pain without sciatica - Plan: PT plan of care cert/re-cert  Pain in thoracic spine - Plan: PT plan of care cert/re-cert  Muscle weakness (generalized) - Plan: PT plan of care cert/re-cert  History of falling - Plan: PT plan of care cert/re-cert     Problem List Patient Active Problem List   Diagnosis Date Noted   Pharyngoesophageal dysphagia 12/14/2020   Osteopenia 10/23/2020   Abnormal glucose 08/13/2020   Chronic migraine without aura, with intractable migraine, so stated, with status migrainosus 07/03/2019   Carotid stenosis, bilateral 06/06/2019   Migraine headache 03/22/2019   Daily headache 03/22/2019   Myofascial pain 03/27/2018   Numbness of left foot 11/07/2017   Right foot pain 09/12/2017   Deviated septum 01/25/2017   ETD (Eustachian tube dysfunction), bilateral 01/25/2017   Seasonal allergic rhinitis 01/25/2017   Bilateral hearing loss 01/25/2017   S/P lumbar fusion 06/20/2016   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   Fatty liver 12/22/2015   Onychodystrophy 11/24/2015   Prepatellar bursitis of right knee 08/18/2014   Hyperlipidemia 12/17/2013   Osteoarthritis of left knee 07/05/2013   Obesity 03/10/2011   Dyspepsia 02/10/2011   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    PALPITATIONS 02/11/2010  OSTEOPENIA 11/27/2009   CONSTIPATION, CHRONIC 08/15/2008   Chronic neck pain 04/11/2008   Disorder of kidney and ureter 01/21/2008   HEMORRHOIDS, INTERNAL 01/16/2008   DIVERTICULOSIS, COLON 01/16/2008   POSTMENOPAUSAL STATUS 12/05/2007   MICROSCOPIC HEMATURIA 06/05/2007   STENOSIS, LUMBAR SPINE 12/22/2005   Anxiety and depression 11/15/2005   IRRITABLE BOWEL SYNDROME 11/15/2005   ARTHRITIS 11/15/2005    Baruch Merl, PT 02/09/21 10:40 AM   [facility] @ [address] [place] [place], [place], [address] Phone: [phone]   Fax:  [phone]  Name: Cheryl [name] MRN: [id number] Date of Birth: 17-Aug-1940

## 2021-02-09 NOTE — Patient Instructions (Signed)
Access Code: [email or web address] URL: [email or web address] Date: 02/09/2021 Prepared by: Venetia Night Tyrah Broers  Exercises Seated Hamstring Stretch - 1 x daily - 7 x weekly - 1 sets - 2 reps - 20 hold Seated Long Arc Quad - 2 x daily - 7 x weekly - 2 sets - 10 reps Seated Toe Raise - 2 x daily - 7 x weekly - 1 sets - 10 reps Standing Row with Anchored Resistance - 2 x daily - 7 x weekly - 2 sets - 10 reps Shoulder Extension with Resistance - 1 x daily - 7 x weekly - 2 sets - 10 reps Standing Shoulder External Rotation with Resistance - 1 x daily - 7 x weekly - 1 sets - 10 reps - 5 hold Supine Shoulder Horizontal Abduction with Resistance - 1 x daily - 7 x weekly - 2 sets - 10 reps Standing Single Leg Stance with Counter Support - 2 x daily - 7 x weekly - 1 sets - 1 reps - 10 hold Heel Raise - 2 x daily - 7 x weekly - 1 sets - 10 reps Doorway Pec Stretch at 90 Degrees Abduction - 1 x daily - 7 x weekly - 1 sets - 4 reps - 10 hold Seated March with Resistance - 1 x daily - 7 x weekly - 1 sets - 10 reps Seated Hip Abduction with Resistance - 1 x daily - 7 x weekly - 1 sets - 10 reps Supine Figure 4 Piriformis Stretch - 1 x daily - 7 x weekly - 1 sets - 2 reps - 20 hold Supine Piriformis Stretch with Foot on Ground - 1 x daily - 7 x weekly - 1 sets - 2 reps - 20 hold Mini Squat with Counter Support - 3 x daily - 7 x weekly - 1 sets - 5 reps Sit to Stand with Hands on Knees - 2 x daily - 7 x weekly - 1 sets - 5 reps Seated Thoracic Extension and Rotation with Reach - 1 x daily - 7 x weekly - 1 sets - 3 reps - 10 hold Wall Push Up - 2 x daily - 7 x weekly - 1 sets - 10 reps Supine Bridge - 1 x daily - 7 x weekly - 1 sets - 10 reps - 5 hold

## 2021-02-15 ENCOUNTER — Other Ambulatory Visit: Payer: Self-pay

## 2021-02-15 ENCOUNTER — Ambulatory Visit (INDEPENDENT_AMBULATORY_CARE_PROVIDER_SITE_OTHER): Payer: PPO | Admitting: Family Medicine

## 2021-02-15 ENCOUNTER — Encounter: Payer: Self-pay | Admitting: Family Medicine

## 2021-02-15 VITALS — BP 134/57 | HR 71 | Ht 65.0 in | Wt 177.0 lb

## 2021-02-15 DIAGNOSIS — G43809 Other migraine, not intractable, without status migrainosus: Secondary | ICD-10-CM

## 2021-02-15 DIAGNOSIS — G4700 Insomnia, unspecified: Secondary | ICD-10-CM

## 2021-02-15 DIAGNOSIS — G3184 Mild cognitive impairment, so stated: Secondary | ICD-10-CM | POA: Diagnosis not present

## 2021-02-15 DIAGNOSIS — R7989 Other specified abnormal findings of blood chemistry: Secondary | ICD-10-CM | POA: Diagnosis not present

## 2021-02-15 DIAGNOSIS — F0390 Unspecified dementia without behavioral disturbance: Secondary | ICD-10-CM | POA: Insufficient documentation

## 2021-02-15 NOTE — Assessment & Plan Note (Signed)
Stop the Aimovig.  I would be interested to see as she tapers down and off the Ambien if her headaches actually improved.  I have seen the notes as a trigger for migraine headaches.
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
## 2021-02-15 NOTE — Assessment & Plan Note (Addendum)
Currently on Ambien 5 mg daily we discussed options including trying to cut back to 2.5 mg so that we we could try to taper her completely off and switch to something new.  Also discussed maybe going to bed a little bit later she is going to bed around 7 PM and then not getting up until 530.  So I would not expect anyone to actually sleep that long it is really too much.  Adequate sleep is anywhere from 6 to 8 hours with average being about 7.  She used to go to bed around 8 PM. Could consider  Trial of belsomra but would need to do " wash out" x 1 wk  has tried Xanax, hydroxyzine, diphenhydramine, trazodone

## 2021-02-15 NOTE — Assessment & Plan Note (Signed)
Mini-Mental status exam score of 24 out of 30 passing based on her age and education levels actually 54.  We discussed using this as a marker in time to see where she is at and to be able to follow her over time.  She did have an MRI done about a year ago which was after symptoms started not showing anything worrisome.  Just some chronic small vascular ischemic changes.  And some atrophy which is not unexpected based on her age.  We will get some up-to-date labs today just to rule out other underlying causes that could worsen or exacerbate her symptoms.  We also discussed possible formal referral to a memory clinic for further evaluation.  We also discussed making sure that her mood is in a good place and getting out a little bit and continuing to work on good sleep quality.  Also discussed that the Ambien could actually be causing more cognitive impairment.

## 2021-02-16 ENCOUNTER — Encounter: Payer: Self-pay | Admitting: Physical Therapy

## 2021-02-16 ENCOUNTER — Ambulatory Visit: Payer: PPO | Admitting: Physical Therapy

## 2021-02-16 DIAGNOSIS — G8929 Other chronic pain: Secondary | ICD-10-CM

## 2021-02-16 DIAGNOSIS — Z9181 History of falling: Secondary | ICD-10-CM

## 2021-02-16 DIAGNOSIS — M6281 Muscle weakness (generalized): Secondary | ICD-10-CM

## 2021-02-16 DIAGNOSIS — M545 Low back pain, unspecified: Secondary | ICD-10-CM | POA: Diagnosis not present

## 2021-02-16 DIAGNOSIS — M546 Pain in thoracic spine: Secondary | ICD-10-CM

## 2021-02-16 NOTE — Progress Notes (Signed)
EN, B12 looks great.  Blood count is normal no sign of anemia or infection.  A1c was down little bit at 5.6 which is great compared to 6 months ago.  Just continue to work on healthy food choices and increasing exercise and activity levels.  Your metabolic panel including liver and kidney function looks great.  Thyroid is normal at 2.9.  2 more tests pending.

## 2021-02-16 NOTE — Therapy (Signed)
[facility] @ [place], [place], [address] Phone: [phone]   Fax:  [phone]  Physical Therapy Treatment  Patient Details  Name: Cheryl [name] MRN: [id number] Date of Birth: [birth year] Referring Provider (PT): Hali Marry, MD   Encounter Date: 02/16/2021   PT End of Session - 02/16/21 1025     Visit Number 7    Date for PT Re-Evaluation 04/06/21    Authorization Type [employer]    Progress Note Due on Visit 10    PT Start Time 1015    PT Stop Time 1100    PT Time Calculation (min) 45 min    Activity Tolerance Patient tolerated treatment well    Behavior During Therapy [facility] for tasks assessed/performed             Past Medical History:  Diagnosis Date   Anemia    Anxiety    d/t recent family issues   Arthritis    Bone spur    Carotid stenosis, bilateral 06/06/2019   Chronic back pain    stenosis and OA   Depression    takes Paxil daily   Diverticulosis    Dizzy    occasionally   Encephalitis    at 9 months ago    Fatty liver    has had 2 Hep shots and the final one is in June 18   GERD (gastroesophageal reflux disease)    takes Omeprazole daily   Hepatitis 2012   History of bronchitis    20-25 yrs ago   History of colon polyps    precancerous   History of shingles    Hyperlipidemia    takes Simvastatin daily   IBS (irritable bowel syndrome)    more on side of constipation-takes Miralax daily   Insomnia    takes Melatonin and Ambien nightly   Internal hemorrhoids    Joint pain    Joint swelling    Lung nodules    calcified    Migraine headache    migraines-thinks coming from neck.Last one1/28/18   Pneumonia    hx of-20 yrs ago-=-walking   Renal cyst    left   Uterine cancer ([place]) 1973    Past Surgical History:  Procedure Laterality Date   APPENDECTOMY  1966   cataracts     bilateral   COLONOSCOPY  09/04/2020   EUS  02/10/2011   Procedure: UPPER  ENDOSCOPIC ULTRASOUND (EUS) RADIAL;  Surgeon: Owens Loffler, MD;  Location: [facility];  Service: Endoscopy;  Laterality: N/A;  radial linear    LUMBAR FUSION  03/2016   L4-5, Dr. Ola Spurr.   RECTOCELE REPAIR  2011   ROTATOR CUFF REPAIR  2009   left   TONSILLECTOMY     UPPER GASTROINTESTINAL ENDOSCOPY     VAGINAL HYSTERECTOMY  1973    There were no vitals filed for this visit.   Subjective Assessment - 02/16/21 1021     Subjective I had to go off my sleep medication and tossed and turned all night last night so my pain is up.  Pain this morning was 6/10 but used heat and now 3/10.  Before last night's bad sleep I was painfree.    Pertinent History Hx of lumbar fusion L4/5 with maintained hardware, min scoliosis    Limitations Standing    How long can you sit comfortably? uses recliner    How long can you stand comfortably? as long as I need to  now    How long can you walk comfortably? depends on the day    Diagnostic tests lumbar and thoracic xrays: L4/5 fusion intact hardware, mod degen L3/4, min scoliosis    Patient Stated Goals get rid of pain, be able to work in kitchen without pain, household chores (vaccuum, mop)    Currently in Pain? Yes    Pain Score 3     Pain Location Back    Pain Orientation Mid;Lower    Pain Descriptors / Indicators Aching;Tightness;Dull    Pain Type Chronic pain    Pain Onset Yesterday    Pain Frequency Constant    Aggravating Factors  last night's sleep tossing and turning having gone off meds    Pain Relieving Factors heat                               [facility] Adult PT Treatment/Exercise - 02/16/21 0001       Self-Care   Self-Care Other Self-Care Comments    Other Self-Care Comments  Calm app info, self-care bedtime routine with heat, meditation and positive affirmations for bedtime/sleep prep      Exercises   Exercises Shoulder;Neck;Lumbar;Knee/Hip      Neck Exercises: Seated   Neck Retraction 5 reps;5 secs    Neck  Retraction Limitations with towel resistance      Neck Exercises: Stretches   Neck Stretch 2 reps;10 seconds    Neck Stretch Limitations SB holding under chair to anchor arm    Other Neck Stretches SNAG stretch 3x10 sec rotation      Lumbar Exercises: Stretches   Single Knee to Chest Stretch 5 reps    Piriformis Stretch Left;Right;1 rep;20 seconds    Piriformis Stretch Limitations pull knee across then push knee away x 20" each    Other Lumbar Stretch Exercise seated trunk rotation 3x10" w/ UE overpressure hands on thighs (HEP)      Shoulder Exercises: Standing   Extension Strengthening;Both;10 reps;Theraband    Theraband Level (Shoulder Extension) Level 1 (Yellow)    Row Strengthening;Both;15 reps;Theraband    Theraband Level (Shoulder Row) Level 1 (Yellow)    Row Limitations 1x5 each: bil, Rt with Rt trunk rot, Lt with Lt trunk rot      Shoulder Exercises: ROM/Strengthening   Wall Pushups 10 reps      Manual Therapy   Manual Therapy Soft tissue mobilization;Joint mobilization    Joint Mobilization upper t-spine PAs and UPAs Gr II    Soft tissue mobilization upper traps, bil thoracic paraspinals                       PT Short Term Goals - 02/09/21 0948       PT SHORT TERM GOAL #1   Title Pt will be ind with initial HEP without exacerbation of pain    Baseline discussed splitting program in 1/2 as needed    Status Achieved      PT SHORT TERM GOAL #2   Title Pt will be able to demo 5x sit to stand without UE support from elevated surface in 18 sec or less    Baseline 18 sec with UE support    Status Partially Met      PT SHORT TERM GOAL #3   Title Pt will be able to perform 20 min of standing kitchen tasks before needing seated break.    Status Achieved  PT SHORT TERM GOAL #4   Title Pt will achieve at least 4/5 strength of Lt quad and tibialis anterior    Baseline 4+/5    Status Achieved      PT SHORT TERM GOAL #5   Title Pt will perform Rt/Lt SLS  at counter with no more than single UE support x 20 sec    Status Achieved               PT Long Term Goals - 02/09/21 0951       PT LONG TERM GOAL #1   Title The patient will be independent in safe self progression of HEP    Status On-going      PT LONG TERM GOAL #2   Title Pt will be able to perform standing household tasks such as cooking and light cleaning for up to 30 min before needing seated break.    Status Achieved      PT LONG TERM GOAL #3   Title Pt will improve FOTO score to at least 57% (from 44%) to demo improved function.    Baseline 66%    Status Achieved      PT LONG TERM GOAL #4   Title Pt will be able to perform 5x sit to stand from chair without UEs in 16 sec or less    Status On-going      PT LONG TERM GOAL #5   Title Pt will improve postural stabilizers, scapular stabilizers and LE strength to at least 4+/5 for improved stairs and housework tolerance.    Baseline improve core and scapular stabilizers, met for LEs    Status On-going                   Plan - 02/16/21 1104     Clinical Impression Statement Pt was consistently painfree until yesterday following MD appt when she d/c'd sleep meds due to side effect of memory challenges.  She had bad night tossing and turning so pain level was up to 6/10 this AM on waking.  Heat reduced it to 3/10.  Calm app, continued use of heat or hot shower/bath and bedtime meditation discussed.  STM and joint mobs with gentle ther ex and stretching used today with good tolerance and reduced pain level end of session.  PT added SNAGs for neck and upper thoracic ROM to enable improved neck retraction for upper quadrant posture today.  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    PT Frequency 1x / week    PT Duration 8 weeks    PT  Treatment/Interventions ADLs/Self Care Home Management;Moist Heat;Electrical Stimulation;Stair training;Functional mobility training;Therapeutic activities;Therapeutic exercise;Balance training;Neuromuscular re-education;Patient/family education;Manual techniques;Dry needling    PT Next Visit Plan did Pt try Calm App, how is sleep without meds, f/u on bridge, wall push up, sit to stand with less UE support, seated thoracic rotation added last time, f/u on splitting HEP into 1/2 or 1/3 to rotate through each day,, tband diagonals w/ rotation, standing T at counter    PT Home Exercise Plan Access Code: [email or web address]    Consulted and Agree with Plan of Care Patient             Patient will benefit from skilled therapeutic intervention in order to improve the following deficits and impairments:     Visit Diagnosis:  Chronic bilateral low back pain without sciatica  Pain in thoracic spine  Muscle weakness (generalized)  History of falling     Problem List Patient Active Problem List   Diagnosis Date Noted   Mild cognitive impairment 02/15/2021   Pharyngoesophageal dysphagia 12/14/2020   Osteopenia 10/23/2020   Abnormal glucose 08/13/2020   Chronic migraine without aura, with intractable migraine, so stated, with status migrainosus 07/03/2019   Carotid stenosis, bilateral 06/06/2019   Migraine headache 03/22/2019   Daily headache 03/22/2019   Myofascial pain 03/27/2018   Numbness of left foot 11/07/2017   Right foot pain 09/12/2017   Deviated septum 01/25/2017   ETD (Eustachian tube dysfunction), bilateral 01/25/2017   Seasonal allergic rhinitis 01/25/2017   Bilateral hearing loss 01/25/2017   S/P lumbar fusion 06/20/2016   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   Fatty liver 12/22/2015   Onychodystrophy 11/24/2015   Prepatellar bursitis of right knee 08/18/2014   Hyperlipidemia 12/17/2013   Osteoarthritis of left knee 07/05/2013   Obesity 03/10/2011    Dyspepsia 02/10/2011   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   PALPITATIONS 02/11/2010   OSTEOPENIA 11/27/2009   CONSTIPATION, CHRONIC 08/15/2008   Insomnia 07/08/2008   Chronic neck pain 04/11/2008   Disorder of kidney and ureter 01/21/2008   HEMORRHOIDS, INTERNAL 01/16/2008   DIVERTICULOSIS, COLON 01/16/2008   POSTMENOPAUSAL STATUS 12/05/2007   MICROSCOPIC HEMATURIA 06/05/2007   STENOSIS, LUMBAR SPINE 12/22/2005   Anxiety and depression 11/15/2005   IRRITABLE BOWEL SYNDROME 11/15/2005   ARTHRITIS 11/15/2005    Baruch Merl, PT 02/16/21 11:46 AM  [facility] @ [place], [place], [address] Phone: [phone]   Fax:  [phone]  Name: Cheryl [name] MRN: [id number] Date of Birth: 10/31/1940

## 2021-02-17 NOTE — Progress Notes (Signed)
HI Cheryl [name], your RPR was negative which is good.  B12 is normal.  B1 is still pending.

## 2021-02-19 LAB — CBC
HCT: 43.4 % (ref 35.0–45.0)
Hemoglobin: 14.5 g/dL (ref 11.7–15.5)
MCH: 29.2 pg (ref 27.0–33.0)
MCHC: 33.4 g/dL (ref 32.0–36.0)
MCV: 87.5 fL (ref 80.0–100.0)
MPV: 10.2 fL (ref 7.5–12.5)
Platelets: 285 10*3/uL (ref 140–400)
RBC: 4.96 10*6/uL (ref 3.80–5.10)
RDW: 12.6 % (ref 11.0–15.0)
WBC: 6.8 10*3/uL (ref 3.8–10.8)

## 2021-02-19 LAB — COMPLETE METABOLIC PANEL WITH GFR
AG Ratio: 1.8 (calc) (ref 1.0–2.5)
ALT: 25 U/L (ref 6–29)
AST: 29 U/L (ref 10–35)
Albumin: 4.5 g/dL (ref 3.6–5.1)
Alkaline phosphatase (APISO): 80 U/L (ref 37–153)
BUN: 16 mg/dL (ref 7–25)
CO2: 28 mmol/L (ref 20–32)
Calcium: 10.2 mg/dL (ref 8.6–10.4)
Chloride: 103 mmol/L (ref 98–110)
Creat: 0.84 mg/dL (ref 0.60–1.00)
Globulin: 2.5 g/dL (calc) (ref 1.9–3.7)
Glucose, Bld: 85 mg/dL (ref 65–99)
Potassium: 4.7 mmol/L (ref 3.5–5.3)
Sodium: 140 mmol/L (ref 135–146)
Total Bilirubin: 0.4 mg/dL (ref 0.2–1.2)
Total Protein: 7 g/dL (ref 6.1–8.1)
eGFR: 71 mL/min/{1.73_m2} (ref >=60)

## 2021-02-19 LAB — TSH: TSH: 2.94 mIU/L (ref 0.40–4.50)

## 2021-02-19 LAB — HEMOGLOBIN A1C
Hgb A1c MFr Bld: 5.6 % of total Hgb (ref <5.7)
Mean Plasma Glucose: 114 mg/dL
eAG (mmol/L): 6.3 mmol/L

## 2021-02-19 LAB — RPR: RPR Ser Ql: NONREACTIVE

## 2021-02-19 LAB — VITAMIN B1: Vitamin B1 (Thiamine): 32 nmol/L — ABNORMAL HIGH (ref 8–30)

## 2021-02-19 LAB — VITAMIN B12: Vitamin B-12: 550 pg/mL (ref 200–1100)

## 2021-02-19 NOTE — Progress Notes (Signed)
Hi Cheryl [name], your vitamin B1 also known as thiamine looks great.

## 2021-02-23 ENCOUNTER — Other Ambulatory Visit: Payer: Self-pay

## 2021-02-23 ENCOUNTER — Encounter: Payer: Self-pay | Admitting: Physical Therapy

## 2021-02-23 ENCOUNTER — Ambulatory Visit: Payer: PPO | Admitting: Physical Therapy

## 2021-02-23 DIAGNOSIS — M545 Low back pain, unspecified: Secondary | ICD-10-CM | POA: Diagnosis not present

## 2021-02-23 DIAGNOSIS — M546 Pain in thoracic spine: Secondary | ICD-10-CM

## 2021-02-23 DIAGNOSIS — M6281 Muscle weakness (generalized): Secondary | ICD-10-CM

## 2021-02-23 DIAGNOSIS — Z9181 History of falling: Secondary | ICD-10-CM

## 2021-02-23 DIAGNOSIS — G8929 Other chronic pain: Secondary | ICD-10-CM

## 2021-02-23 NOTE — Therapy (Signed)
[facility] [address] [place], [place], [address] Phone: [phone]   Fax:  [phone]  Physical Therapy Treatment  Patient Details  Name: Cheryl [name] MRN: [id number] Date of Birth: [birth year] Referring Provider (PT): Hali Marry, MD   Encounter Date: 02/23/2021   PT End of Session - 02/23/21 0928     Visit Number 8    Date for PT Re-Evaluation 04/06/21    Authorization Type [employer]    Progress Note Due on Visit 10    PT Start Time 0929    PT Stop Time 1015    PT Time Calculation (min) 46 min    Activity Tolerance Patient tolerated treatment well    Behavior During Therapy [facility] for tasks assessed/performed             Past Medical History:  Diagnosis Date   Anemia    Anxiety    d/t recent family issues   Arthritis    Bone spur    Carotid stenosis, bilateral 06/06/2019   Chronic back pain    stenosis and OA   Depression    takes Paxil daily   Diverticulosis    Dizzy    occasionally   Encephalitis    at 9 months ago    Fatty liver    has had 2 Hep shots and the final one is in June 18   GERD (gastroesophageal reflux disease)    takes Omeprazole daily   Hepatitis 2012   History of bronchitis    20-25 yrs ago   History of colon polyps    precancerous   History of shingles    Hyperlipidemia    takes Simvastatin daily   IBS (irritable bowel syndrome)    more on side of constipation-takes Miralax daily   Insomnia    takes Melatonin and Ambien nightly   Internal hemorrhoids    Joint pain    Joint swelling    Lung nodules    calcified    Migraine headache    migraines-thinks coming from neck.Last one1/28/18   Pneumonia    hx of-20 yrs ago-=-walking   Renal cyst    left   Uterine cancer ([place]) 1973    Past Surgical History:  Procedure Laterality Date   APPENDECTOMY  1966   cataracts     bilateral   COLONOSCOPY  09/04/2020   EUS  02/10/2011   Procedure: UPPER  ENDOSCOPIC ULTRASOUND (EUS) RADIAL;  Surgeon: Owens Loffler, MD;  Location: [facility];  Service: Endoscopy;  Laterality: N/A;  radial linear    LUMBAR FUSION  03/2016   L4-5, Dr. Ola Spurr.   RECTOCELE REPAIR  2011   ROTATOR CUFF REPAIR  2009   left   TONSILLECTOMY     UPPER GASTROINTESTINAL ENDOSCOPY     VAGINAL HYSTERECTOMY  1973    There were no vitals filed for this visit.   Subjective Assessment - 02/23/21 0929     Subjective I have only had one horrible night since last here - I'm getting used to not having my sleep medication.  Pain has gone back down since I'm not tossing and turning as much.    Pertinent History Hx of lumbar fusion L4/5 with maintained hardware, min scoliosis    Limitations Standing    How long can you sit comfortably? uses recliner    How long can you stand comfortably? as long as I need to now    How long can you  walk comfortably? depends on the day    Diagnostic tests lumbar and thoracic xrays: L4/5 fusion intact hardware, mod degen L3/4, min scoliosis    Patient Stated Goals get rid of pain, be able to work in kitchen without pain, household chores (vaccuum, mop)    Currently in Pain? No/denies    Pain Type Chronic pain    Pain Onset More than a month ago    Pain Frequency Occasional                               [facility] Adult PT Treatment/Exercise - 02/23/21 0001       Exercises   Exercises Shoulder;Neck;Lumbar;Knee/Hip      Lumbar Exercises: Aerobic   Nustep L5 x 6' PT present to discuss progress      Lumbar Exercises: Seated   Long Arc Quad on Chair Strengthening;Both;2 sets;10 reps;Weights    LAQ on Chair Weights (lbs) 2    Sit to Stand 5 reps   hold yellow plyo ball   Other Seated Lumbar Exercises yellow plyo ball hip to hip x 20, hip to opp shoulder x 10 each    Other Seated Lumbar Exercises blue ball rollouts for lumbar flexion and flex/SB Rt/Lt x 5 each way      Lumbar Exercises: Supine   Bridge 10 reps     Bridge Limitations VC reach knees long      Knee/Hip Exercises: Stretches   Active Hamstring Stretch Left;Right;1 rep;30 seconds    Active Hamstring Stretch Limitations seated    Piriformis Stretch Right;3 reps;10 seconds    Other Knee/Hip Stretches sciatic nerve flossing at knee and ankle x 20 pumps each, bil, supine    Other Knee/Hip Stretches fig 4, Rt 3x10"      Knee/Hip Exercises: Standing   Forward Step Up 1 set;5 reps;Step Height: 6";Hand Hold: 2      Shoulder Exercises: Standing   Extension Strengthening;15 reps;Theraband    Theraband Level (Shoulder Extension) Level 3 (Green)    Corporate treasurer;Theraband;Both;20 reps    Theraband Level (Shoulder Row) Level 3 (Green)    Row Limitations 2x10      Shoulder Exercises: ROM/Strengthening   Wall Pushups 5 reps      Shoulder Exercises: Stretch   Other Shoulder Stretches doorway stretch 4x15 sec alt feet for concurrent gastroc stretch                       PT Short Term Goals - 02/09/21 0948       PT SHORT TERM GOAL #1   Title Pt will be ind with initial HEP without exacerbation of pain    Baseline discussed splitting program in 1/2 as needed    Status Achieved      PT SHORT TERM GOAL #2   Title Pt will be able to demo 5x sit to stand without UE support from elevated surface in 18 sec or less    Baseline 18 sec with UE support    Status Partially Met      PT SHORT TERM GOAL #3   Title Pt will be able to perform 20 min of standing kitchen tasks before needing seated break.    Status Achieved      PT SHORT TERM GOAL #4   Title Pt will achieve at least 4/5 strength of Lt quad and tibialis anterior    Baseline 4+/5    Status Achieved  PT SHORT TERM GOAL #5   Title Pt will perform Rt/Lt SLS at counter with no more than single UE support x 20 sec    Status Achieved               PT Long Term Goals - 02/09/21 0951       PT LONG TERM GOAL #1   Title The patient will be independent in safe  self progression of HEP    Status On-going      PT LONG TERM GOAL #2   Title Pt will be able to perform standing household tasks such as cooking and light cleaning for up to 30 min before needing seated break.    Status Achieved      PT LONG TERM GOAL #3   Title Pt will improve FOTO score to at least 57% (from 44%) to demo improved function.    Baseline 66%    Status Achieved      PT LONG TERM GOAL #4   Title Pt will be able to perform 5x sit to stand from chair without UEs in 16 sec or less    Status On-going      PT LONG TERM GOAL #5   Title Pt will improve postural stabilizers, scapular stabilizers and LE strength to at least 4+/5 for improved stairs and housework tolerance.    Baseline improve core and scapular stabilizers, met for LEs    Status On-going                   Plan - 02/23/21 0935     Clinical Impression Statement Pt is sleeping better and therefore having less pain from not tossing and turning.  She arrived painfree.  She reports she was able to dust and mop yesterday without pain, taking a few short breaks as needed.  PT added supine sciatic flossing due to Pt report of ongoing chronic tingling into LEs which she did report improvement in following.  PT closely monitored for fatigue and pain with progression of strength training today for core, LEs and UEs. Pt increased tband ther ex for scapular strength to green today and added second set of reps.  Fatigue noted with 6" fwd step ups.  Pt able to rise from low mat holding 5lb without UE assist which has come a long way from first visit when she was signif dependent on UE assist.  Continue along POC.    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    PT Frequency 1x / week    PT Duration 8 weeks    PT Treatment/Interventions ADLs/Self Care Home Management;Moist Heat;Electrical Stimulation;Stair training;Functional mobility training;Therapeutic  activities;Therapeutic exercise;Balance training;Neuromuscular re-education;Patient/family education;Manual techniques;Dry needling    PT Next Visit Plan f/u on bridge, wall push up, sit to stand with less UE support, seated thoracic rotation added last time, f/u on splitting HEP into 1/2 or 1/3 to rotate through each day,, tband diagonals w/ rotation, standing T at counter    PT Home Exercise Plan Access Code: [email or web address]    Consulted and Agree with Plan of Care Patient             Patient will benefit from skilled therapeutic intervention in order to improve the following deficits and impairments:     Visit Diagnosis: Chronic bilateral low back pain without sciatica  Pain in thoracic spine  Muscle weakness (generalized)  History of falling     Problem  List Patient Active Problem List   Diagnosis Date Noted   Mild cognitive impairment 02/15/2021   Pharyngoesophageal dysphagia 12/14/2020   Osteopenia 10/23/2020   Abnormal glucose 08/13/2020   Chronic migraine without aura, with intractable migraine, so stated, with status migrainosus 07/03/2019   Carotid stenosis, bilateral 06/06/2019   Migraine headache 03/22/2019   Daily headache 03/22/2019   Myofascial pain 03/27/2018   Numbness of left foot 11/07/2017   Right foot pain 09/12/2017   Deviated septum 01/25/2017   ETD (Eustachian tube dysfunction), bilateral 01/25/2017   Seasonal allergic rhinitis 01/25/2017   Bilateral hearing loss 01/25/2017   S/P lumbar fusion 06/20/2016   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   Fatty liver 12/22/2015   Onychodystrophy 11/24/2015   Prepatellar bursitis of right knee 08/18/2014   Hyperlipidemia 12/17/2013   Osteoarthritis of left knee 07/05/2013   Obesity 03/10/2011   Dyspepsia 02/10/2011   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   PALPITATIONS 02/11/2010   OSTEOPENIA 11/27/2009   CONSTIPATION, CHRONIC 08/15/2008   Insomnia 07/08/2008   Chronic neck pain  04/11/2008   Disorder of kidney and ureter 01/21/2008   HEMORRHOIDS, INTERNAL 01/16/2008   DIVERTICULOSIS, COLON 01/16/2008   POSTMENOPAUSAL STATUS 12/05/2007   MICROSCOPIC HEMATURIA 06/05/2007   STENOSIS, LUMBAR SPINE 12/22/2005   Anxiety and depression 11/15/2005   IRRITABLE BOWEL SYNDROME 11/15/2005   ARTHRITIS 11/15/2005    Baruch Merl, PT 02/23/21 10:19 AM   [facility] @ [address], [place], [address] Phone: [phone]   Fax:  [phone]  Name: Cheryl [name] MRN: [id number] Date of Birth: 1940/09/04

## 2021-02-24 ENCOUNTER — Ambulatory Visit: Payer: PPO | Admitting: Dermatology

## 2021-02-24 ENCOUNTER — Encounter: Payer: Self-pay | Admitting: Dermatology

## 2021-02-24 DIAGNOSIS — Z1283 Encounter for screening for malignant neoplasm of skin: Secondary | ICD-10-CM

## 2021-02-24 DIAGNOSIS — L821 Other seborrheic keratosis: Secondary | ICD-10-CM

## 2021-02-24 DIAGNOSIS — D485 Neoplasm of uncertain behavior of skin: Secondary | ICD-10-CM

## 2021-02-24 DIAGNOSIS — L570 Actinic keratosis: Secondary | ICD-10-CM

## 2021-02-24 DIAGNOSIS — R238 Other skin changes: Secondary | ICD-10-CM

## 2021-02-24 NOTE — Patient Instructions (Signed)

## 2021-03-01 ENCOUNTER — Ambulatory Visit: Payer: PPO | Admitting: Neurology

## 2021-03-01 ENCOUNTER — Telehealth: Payer: Self-pay | Admitting: Neurology

## 2021-03-01 ENCOUNTER — Other Ambulatory Visit: Payer: Self-pay

## 2021-03-01 ENCOUNTER — Encounter: Payer: Self-pay | Admitting: Neurology

## 2021-03-01 VITALS — BP 119/70 | HR 88 | Ht 65.0 in | Wt 175.6 lb

## 2021-03-01 DIAGNOSIS — G992 Myelopathy in diseases classified elsewhere: Secondary | ICD-10-CM

## 2021-03-01 DIAGNOSIS — M4802 Spinal stenosis, cervical region: Secondary | ICD-10-CM | POA: Diagnosis not present

## 2021-03-01 DIAGNOSIS — G43009 Migraine without aura, not intractable, without status migrainosus: Secondary | ICD-10-CM | POA: Diagnosis not present

## 2021-03-01 DIAGNOSIS — G8929 Other chronic pain: Secondary | ICD-10-CM

## 2021-03-01 DIAGNOSIS — M542 Cervicalgia: Secondary | ICD-10-CM

## 2021-03-01 DIAGNOSIS — R292 Abnormal reflex: Secondary | ICD-10-CM | POA: Diagnosis not present

## 2021-03-01 DIAGNOSIS — R258 Other abnormal involuntary movements: Secondary | ICD-10-CM

## 2021-03-01 DIAGNOSIS — M5030 Other cervical disc degeneration, unspecified cervical region: Secondary | ICD-10-CM

## 2021-03-01 MED ORDER — UBRELVY 100 MG PO TABS
100.0000 mg | ORAL_TABLET | ORAL | 11 refills | Status: DC | PRN
Start: 2021-03-01 — End: 2022-04-20

## 2021-03-01 NOTE — Progress Notes (Signed)
PATIENT: Cheryl [name] DOB: 1940-06-20  REASON FOR VISIT: follow up HISTORY FROM: patient  HISTORY OF PRESENT ILLNESS: Today 03/01/21:  03/01/2021: Patient Is here for folow up of headaches. She was doing extremely well on Ajovy even when we saw her 08/24/2020 but now states it isn;t working anymore and stopped it last month. She had an MRI cervical spine earlier this year 02/17/2020 as below. NOW she says the Ajovy never really worked. She says it is her neck and shoulders that is causing th headaches. She feels the ablation a year ago of the neck really helped it was about 6 months headache free, and she went back back in September of 2022 and had another ablation an that was doing great until she fell around October 10th and since then the headaches have returned, not as bad but the Ajovy wasn't woring (maybe it was the ablation she thinks). A lot of neck pain, no shooting pain down the arms and no weakness, she has a problem with balance, she has been in PT, she had that fall in October. She wants to go back and see Dr. Vivien Rota. She has 6 migraine days a month and < 14 (headache+migraine total a month)   Patient complains of symptoms per HPI as well as the following symptoms: worsening neckpain . Pertinent negatives and positives per HPI. All others negative   Reviewed images and agree, reviewed with patient  Disc levels: Multilevel desiccation and disc space loss most prominent at the C5-7 level.   C2-3: No significant disc bulge. Patent spinal canal and neural foramen.   C3-4: Disc osteophyte complex with superimposed left foraminal protrusion, uncovertebral and facet hypertrophy. Patent spinal canal and right neural foramen. Moderate left neural foraminal narrowing.   C4-5: Disc osteophyte complex with superimposed left subarticular protrusion. Left predominant uncovertebral and facet hypertrophy. Mild spinal canal, mild right and moderate left neural foraminal narrowing.    C5-6: Disc osteophyte complex abutting the ventral cord with uncovertebral and facet hypertrophy. Moderate spinal canal, moderate right and mild left neural foraminal narrowing.   C6-7: Disc osteophyte complex with superimposed bilateral foraminal protrusions. Uncovertebral and facet hypertrophy. Mild spinal canal, mild right and moderate left neural foraminal narrowing. 3 mm right perineural cyst, unchanged.   C7-T1: No significant disc bulge. Uncovertebral and facet degenerative spurring. Patent spinal canal and left neural foramen. Mild right neural foraminal narrowing. Bilateral perineural cyst measuring 4 mm.   Paraspinal tissues: Negative.   IMPRESSION: Multilevel spondylosis, progressed since prior exam.   Moderate C5-6 and mild C4-5, C6-7 spinal canal narrowing.   Moderate left C3-5, right C5-6 and left C6-7 neural foraminal narrowing.   Multilevel sub 5-mm perineural cyst as detailed above.     Electronically Signed   By: Primitivo Gauze M.D.   On: 02/17/2020 12:13  08/24/2020: Cheryl [name] is a 81 year old female with a history of cervicogenic headaches and migraine headaches.  She returns today for follow-up.  She reports that since the beginning of the year she has had very few headaches.  She reports a mild headache last week but typically she can use her rice pack on her neck and her headache resolves within 30 minutes or so.  She states when she was taking Mobic she rarely had a headache.  But it caused hair loss so she is no longer taking this medication   10/16/19: Cheryl [name] is a 81 year old female with a history of cervicogenic headaches and migraine headaches.  She reports that  overall she has been doing well.  She reports that since starting Ajovy she will go some weeks with no headaches at all.  Previously she was having daily headaches.  She does report on occasion she will wake up with headaches.  She states that the headaches are typically very mild.  She  does report photophobia but denies nausea or vomiting.  She is unsure if she snores.  Denies excessive daytime fatigue.  Overall she feels that she has improved since her visit with Dr. Jaynee Eagles  HISTORY Cheryl [name] is a 81 y.o. female here as requested by [name] for migraines. I reviewed [name] PAC's notes: Patient was being seen by [facility] neurosurgery and spine for cervicogenic headache, neck pain, she was last seen September 13, 2019 for bilateral C3 C2-C5 medial branch block.  She has chronic cervical pain with cervicogenic headache as well as chronic lumbar pain previously treated with tramadol, she is tried various medications for headache management such as nortriptyline and amitriptyline, amitriptyline was initially effective but then stopped, when she was seen for follow-up April 20 she had excellent response to the medial branch block and 80 to 85% improvement in her neck pain, little pain in the neck itself unfortunately she continued to describe headache daily, headache symptoms are very limiting, making it difficult for her to be active as well as difficult for her to sleep, no new symptoms or falls.  I was also able to review Dr. Fay Records notes pertaining to her headaches: Patient does have a history of migraines, she felt like they are coming from her neck, a lot of tension and tightness in her neck and a decrease in rotation to the left, heat in the morning helps, inevitably during the day her headache starts in the last until she goes to bed, some days it is more severe than others, she tried needling which caused more pain, she is tried over-the-counter medications for the neck pain as well, headaches worsening since quarantine over a year, no visual changes, she was told she could not use medications for her migraines due to "white spots" on her brain, headaches can move around and can be on the top of the head and the side of the head or the back of the head, sometimes behind the  eyes, usually more on one side.  She is tried amitriptyline as well as Topamax and multiple other medications.   She has been diagnosed with migraines for about 40 years, they slowly progressed and now they are debilitating, she can feel it coming in the back or the sides of her head most of the time in the temples, can be anytime, in the temples, she tried bote guards, it is throbbing, phono/photophobia, nausea, movement makes it worse, no vomiting, can last for up to a week off and on but one headache can last 24 hours. Daily headaches, 15 migraine days a month that are moderately severe to severe. No aura, no weakness, no numbness or tingling or weakness, heat may help a little on the neck otherwise nothing over the counter really helps she takes excedrin migraine, no known family history, no head trauma or inciting events, encephalitis as a child. Ongoing at thie frequency and severity and quality for years.    Reviewed notes, labs and imaging from outside physicians, which showed:     MRI brain 05/13/2017: IMPRESSION: No acute abnormality. Progressive atrophy and chronic white matter changes since 2012.   Amitriptyline, gabapentin, propranolol, paxil, tramadol,  trazodone    REVIEW OF SYSTEMS: Out of a complete 14 system review of symptoms, the patient complains only of the following symptoms, and all other reviewed systems are negative.  See HPI  ALLERGIES: Allergies  Allergen Reactions   Celebrex [Celecoxib] Itching, Nausea Only and Other (See Comments)    Gas, hair loss   Fluoxetine Hcl Shortness Of Breath and Itching    REACTION: itching,SOB   Amitriptyline     Dry mouth    Contrast Media [Iodinated Contrast Media] Itching    Multilance.    Dexamethasone Diarrhea and Nausea And Vomiting   Livalo [Pitavastatin] Other (See Comments)    Myalgia    Metrizamide Itching    Multilance.    Nitroglycerin     Nausea,dizziness,sweats   Rosuvastatin Other (See Comments)    bodyaches  and cramping   Simvastatin Other (See Comments)    Muscle aches and HA   Codeine Sulfate Itching    REACTION: itching   Sulfasalazine Nausea Only    REACTION: Nausea   Sulfonamide Derivatives Nausea Only    REACTION: Nausea    HOME MEDICATIONS: Outpatient Medications Prior to Visit  Medication Sig Dispense Refill   aspirin 81 MG tablet Take 81 mg by mouth daily.     Cholecalciferol (VITAMIN D) 1000 UNITS capsule Take 2,000 Units by mouth daily.     Evolocumab (REPATHA SURECLICK) 157 MG/ML SOAJ INJECT 140 MG INTO THE SKIN EVERY 14 (FOURTEEN) DAYS. 2 mL 11   fluticasone (FLONASE) 50 MCG/ACT nasal spray INSTILL 2 SPRAYS INTO EACH NOSTRIL EVERY DAY 48 mL 1   MAGNESIUM PO Take 1 tablet by mouth daily.      Omega-3 Fatty Acids (FISH OIL) 1000 MG CAPS Take 1 capsule (1,000 mg total) by mouth daily. (Patient taking differently: Take 2,000 mg by mouth daily.) 90 capsule 3   omeprazole (PRILOSEC) 20 MG capsule TAKE 1 CAPSULE TWICE DAILY BEFORE A MEAL 180 capsule 3   PARoxetine (PAXIL) 40 MG tablet Take 1 tablet (40 mg total) by mouth daily. 90 tablet 1   polyethylene glycol (MIRALAX / GLYCOLAX) packet Take 17 g by mouth daily.     RESTASIS 0.05 % ophthalmic emulsion Place 1 drop into both eyes 2 (two) times daily.      traMADol (ULTRAM) 50 MG tablet Take 2 tablets (100 mg total) by mouth daily as needed. 180 tablet 1   No facility-administered medications prior to visit.    PAST MEDICAL HISTORY: Past Medical History:  Diagnosis Date   Anemia    Anxiety    d/t recent family issues   Arthritis    Bone spur    Carotid stenosis, bilateral 06/06/2019   Chronic back pain    stenosis and OA   Depression    takes Paxil daily   Diverticulosis    Dizzy    occasionally   Encephalitis    at 9 months ago    Fatty liver    has had 2 Hep shots and the final one is in June 18   GERD (gastroesophageal reflux disease)    takes Omeprazole daily   Hepatitis 2012   History of bronchitis    20-25  yrs ago   History of colon polyps    precancerous   History of shingles    Hyperlipidemia    takes Simvastatin daily   IBS (irritable bowel syndrome)    more on side of constipation-takes Miralax daily   Insomnia    takes Melatonin and  Ambien nightly   Internal hemorrhoids    Joint pain    Joint swelling    Lung nodules    calcified    Migraine headache    migraines-thinks coming from neck.Last one1/28/18   Pneumonia    hx of-20 yrs ago-=-walking   Renal cyst    left   Uterine cancer ([place]) 1973    PAST SURGICAL HISTORY: Past Surgical History:  Procedure Laterality Date   APPENDECTOMY  1966   cataracts     bilateral   COLONOSCOPY  09/04/2020   EUS  02/10/2011   Procedure: UPPER ENDOSCOPIC ULTRASOUND (EUS) RADIAL;  Surgeon: Owens Loffler, MD;  Location: [facility] ENDOSCOPY;  Service: Endoscopy;  Laterality: N/A;  radial linear    LUMBAR FUSION  03/2016   L4-5, Dr. Ola Spurr.   [place]   ROTATOR CUFF REPAIR  2009   left   TONSILLECTOMY     UPPER GASTROINTESTINAL ENDOSCOPY     VAGINAL HYSTERECTOMY  1973    FAMILY HISTORY: Family History  Problem Relation Age of Onset   Kidney disease Mother    Kidney failure Mother        med induced   Rheum arthritis Mother    Heart disease Father    Alcohol abuse Father    Lung cancer Father        smoker   Depression Brother    Crohn's disease Brother    Alcohol abuse Brother    Heart failure Daughter    Lung cancer Son    HIV Son    Heart disease Other        Paternal family    Malignant hyperthermia Neg Hx    Colon cancer Neg Hx    Esophageal cancer Neg Hx    Rectal cancer Neg Hx    Stomach cancer Neg Hx    Migraines Neg Hx     SOCIAL HISTORY: Social History   Socioeconomic History   Marital status: Divorced    Spouse name: Not on file   Number of children: 5   Years of education: Not on file   Highest education level: Not on file  Occupational History   Occupation: Retired      Fish farm manager:  RETIRED  Tobacco Use   Smoking status: Former    Types: Cigarettes    Quit date: 02/07/1993    Years since quitting: 28.0   Smokeless tobacco: Never  Vaping Use   Vaping Use: Never used  Substance and Sexual Activity   Alcohol use: No   Drug use: No   Sexual activity: Not on file  Other Topics Concern   Not on file  Social History Narrative   10/17/2020      Lives alone with yorky dog      Has trouble walking right now (hip pain that she has spoken with pcp)       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                 Epworth Sleepiness Scale = 1 (as of 04/10/15)      07/03/2019   Lives at home alone with her dog   Right handed    Caffeine: tea occasional   Social Determinants of Health   Financial Resource Strain: Not on file  Food Insecurity: Not on file  Transportation Needs: Not on file  Physical Activity: Not on file  Stress: Not on file  Social  Connections: Not on file  Intimate Partner Violence: Not on file      PHYSICAL EXAM  Vitals:   03/01/21 0707  BP: 119/70  Pulse: 88  Weight: 175 lb 9.6 oz (79.7 kg)  Height: 5\' 5"  (1.651 m)   Body mass index is 29.22 kg/m.  Generalized: Well developed, in no acute distress   Neurological examination  Mentation: Alert oriented to time, place, history taking. Follows all commands speech and language fluent Cranial nerve II-XII: Pupils were equal round reactive to light. Extraocular movements were full, visual field were full on confrontational test. Head turning and shoulder shrug  were normal and symmetric. Motor: The motor testing reveals 5 over 5 strength of all 4 extremities. Good symmetric motor tone is noted throughout.  Sensory: Sensory testing is intact to soft touch on all 4 extremities. No evidence of extinction is noted.  Coordination: Cerebellar testing reveals good finger-nose-finger and heel-to-shin bilaterally.  Gait and station: Gait is normal. Reflexes: Deep tendon reflexes are  symmetric and normal bilaterally.   DIAGNOSTIC DATA (LABS, IMAGING, TESTING) - I reviewed patient records, labs, notes, testing and imaging myself where available.  Lab Results  Component Value Date   WBC 6.8 02/15/2021   HGB 14.5 02/15/2021   HCT 43.4 02/15/2021   MCV 87.5 02/15/2021   PLT 285 02/15/2021      Component Value Date/Time   NA 140 02/15/2021 1159   NA 139 12/07/2020 0852   K 4.7 02/15/2021 1159   CL 103 02/15/2021 1159   CO2 28 02/15/2021 1159   GLUCOSE 85 02/15/2021 1159   BUN 16 02/15/2021 1159   BUN 13 12/07/2020 0852   CREATININE 0.84 02/15/2021 1159   CALCIUM 10.2 02/15/2021 1159   PROT 7.0 02/15/2021 1159   PROT 6.4 12/07/2020 0852   ALBUMIN 4.2 12/07/2020 0852   AST 29 02/15/2021 1159   ALT 25 02/15/2021 1159   ALKPHOS 96 12/07/2020 0852   BILITOT 0.4 02/15/2021 1159   BILITOT 0.4 12/07/2020 0852   GFRNONAA 60 01/01/2020 1033   GFRAA 70 01/01/2020 1033   Lab Results  Component Value Date   CHOL 185 12/07/2020   HDL 48 12/07/2020   LDLCALC 109 (H) 12/07/2020   TRIG 161 (H) 12/07/2020   CHOLHDL 3.9 12/07/2020   Lab Results  Component Value Date   HGBA1C 5.6 02/15/2021   Lab Results  Component Value Date   VITAMINB12 550 02/15/2021   Lab Results  Component Value Date   TSH 2.94 02/15/2021    Physical exam: Exam: Gen: NAD, conversant, well nourised, obese, well groomed                     CV: RRR, no MRG. No Carotid Bruits. No peripheral edema, warm, nontender Eyes: Conjunctivae clear without exudates or hemorrhage  Neuro: Detailed Neurologic Exam  Speech:    Speech is normal; fluent and spontaneous with normal comprehension.  Cognition:    The patient is oriented to person, place, and time;     recent and remote memory intact;     language fluent;     normal attention, concentration,     fund of knowledge Cranial Nerves:    The pupils are equal, round, and reactive to light.   Extraocular movements are intact. Trigeminal  sensation is intact and the muscles of mastication are normal. The face is symmetric. The palate elevates in the midline. Hearing intact. Voice is normal. Shoulder shrug is normal. The tongue has  normal motion without fasciculations.   Coordination:    Normal  Motor Observation:    No asymmetry, no atrophy, and no involuntary movements noted. Tone:    Normal muscle tone.    Posture:    Posture is normal. normal erect    Strength:    Strength is V/V in the upper and lower limbs. Some weakness distally in the lower extremities with difficulty walking on heels.      Sensation: intact to LT     Reflex Exam:  DTR's:    Today exam show +hoffman's on the right. Brisk reflexes biceps and patellars for age (except absent AJ on the left, trace on the right, possible one beat clonus n the right). Imbalance.    ASSESSMENT AND PLAN 81 y.o. year old female  has a past medical history of Anemia, Anxiety, Arthritis, Bone spur, Carotid stenosis, bilateral (06/06/2019), Chronic back pain, Depression, Diverticulosis, Dizzy, Encephalitis, Fatty liver, GERD (gastroesophageal reflux disease), Hepatitis (2012), History of bronchitis, History of colon polyps, History of shingles, Hyperlipidemia, IBS (irritable bowel syndrome), Insomnia, Internal hemorrhoids, Joint pain, Joint swelling, Lung nodules, Migraine headache, Pneumonia, Renal cyst, and Uterine cancer ([place]) (1973). here with: neck pain, cervicogenic headaches and migraines  Today exam show +hoffman's on the right. Brisk reflexes biceps and patellars (except absent AJ on the left, trace on the right, possible one beat clonus on the right). Imbalance and fall.  - [facility] for another ablation, helped her pain significantly, multilevel degenerative cervical changes with multilevel moderately severe stenosis.   - Now with some mild signs of myelopathy and worsening neck pain, fall and imbalance. Needs re-evaluation of her neck, is established  with Dr. Ellene Route, we will repeat MRI cervical spine.  - Start Roselyn Meier as needed for migraines: She has 6 migraine days a month and < 14 (headache+migraine total a month). Failed multiple triptans. Imitrex and maxalt and others. Also hesitate to give triptans to a 81 year old.   Orders Placed This Encounter  Procedures   MR CERVICAL SPINE WO CONTRAST   Ambulatory referral to Pain Clinic   Ambulatory referral to Neurosurgery    Meds ordered this encounter  Medications   Ubrogepant (UBRELVY) 100 MG TABS    Sig: Take 100 mg by mouth every 2 (two) hours as needed. Maximum 200mg  a day.    Dispense:  16 tablet    Refill:  [place], MD [facility]  Orders Placed This Encounter  Procedures   MR CERVICAL SPINE [facility]   Ambulatory referral to Pain Clinic   Ambulatory referral to Neurosurgery   Meds ordered this encounter  Medications   Ubrogepant (UBRELVY) 100 MG TABS    Sig: Take 100 mg by mouth every 2 (two) hours as needed. Maximum 200mg  a day.    Dispense:  16 tablet    Refill:  11    I spent over 40 minutes of face-to-face and non-face-to-face time with patient on the  1. Migraine without aura and without status migrainosus, not intractable   2. DDD (degenerative disc disease), cervical   3. Stenosis of cervical spine with myelopathy ([place])   4. Chronic neck pain   5. Abnormal DTR (deep tendon reflex)   6. Clonus    diagnosis.  This included previsit chart review, lab review, study review, order entry, electronic health record documentation, patient education on the different diagnostic and therapeutic options, counseling and coordination of care, risks and benefits of management, compliance,  or risk factor reduction   Cc: Hali Marry, *,  Hali Marry, MD  Sarina Ill, MD  [facility] [address] [place], [place] [address]  Phone [phone] Fax [phone]

## 2021-03-01 NOTE — Telephone Encounter (Signed)
Referral has been sent to [facility]. Phone: [phone].

## 2021-03-01 NOTE — Patient Instructions (Addendum)
MRI cervical spine After MRI cervical spine, want Dr. Ellene Route to look at your neck again Ablation of the neck - send back to [place] [facility] seemed to really help  Roselyn Meier as needed for migraines: Please take one tablet at the onset of your headache. If it does not improve the symptoms please take one additional tablet. Do not take more then 2 tablets in 24hrs. Do not take use more then 2 to 3 times in a week.  Ubrogepant tablets What is this medication? UBROGEPANT (ue BROE je pant) is used to treat migraine headaches with or without aura. An aura is a strange feeling or visual disturbance that warns you of an attack. It is not used to prevent migraines. This medicine may be used for other purposes; ask your health care provider or pharmacist if you have questions. COMMON BRAND NAME(S): Roselyn Meier What should I tell my care team before I take this medication? They need to know if you have any of these conditions: kidney disease liver disease an unusual or allergic reaction to ubrogepant, other medicines, foods, dyes, or preservatives pregnant or trying to get pregnant breast-feeding How should I use this medication? Take this medicine by mouth with a glass of water. Follow the directions on the prescription label. You can take it with or without food. If it upsets your stomach, take it with food. Take your medicine at regular intervals. Do not take it more often than directed. Do not stop taking except on your doctor's advice. Talk to your pediatrician about the use of this medicine in children. Special care may be needed. Overdosage: If you think you have taken too much of this medicine contact a poison control center or emergency room at once. NOTE: This medicine is only for you. Do not share this medicine with others. What if I miss a dose? This does not apply. This medicine is not for regular use. What may interact with this medication? Do not take this medicine with any of the  following medicines: ceritinib certain antibiotics like chloramphenicol, clarithromycin, telithromycin certain antivirals for HIV like atazanavir, cobicistat, darunavir, delavirdine, fosamprenavir, indinavir, ritonavir certain medicines for fungal infections like itraconazole, ketoconazole, posaconazole, voriconazole conivaptan grapefruit idelalisib mifepristone nefazodone ribociclib This medicine may also interact with the following medications: carvedilol certain medicines for seizures like phenobarbital, phenytoin ciprofloxacin cyclosporine eltrombopag fluconazole fluvoxamine quinidine rifampin [facility]. John's wort verapamil This list may not describe all possible interactions. Give your health care provider a list of all the medicines, herbs, non-prescription drugs, or dietary supplements you use. Also tell them if you smoke, drink alcohol, or use illegal drugs. Some items may interact with your medicine. What should I watch for while using this medication? Visit your health care professional for regular checks on your progress. Tell your health care professional if your symptoms do not start to get better or if they get worse. Your mouth may get dry. Chewing sugarless gum or sucking hard candy and drinking plenty of water may help. Contact your health care professional if the problem does not go away or is severe. What side effects may I notice from receiving this medication? Side effects that you should report to your doctor or health care professional as soon as possible: allergic reactions like skin rash, itching or hives; swelling of the face, lips, or tongue Side effects that usually do not require medical attention (report these to your doctor or health care professional if they continue or are bothersome): drowsiness dry mouth nausea tiredness This list  may not describe all possible side effects. Call your doctor for medical advice about side effects. You may report side  effects to FDA at 1-800-FDA-1088. Where should I keep my medication? Keep out of the reach of children. Store at room temperature between 15 and 30 degrees C (59 and 86 degrees F). Throw away any unused medicine after the expiration date. NOTE: This sheet is a summary. It may not cover all possible information. If you have questions about this medicine, talk to your doctor, pharmacist, or health care provider.  2022 Elsevier/Gold Standard (2018-04-12 00:00:00)

## 2021-03-01 NOTE — Telephone Encounter (Signed)
Health team order sent to [facility], [facility] they will reach out to the patient to schedule.

## 2021-03-02 ENCOUNTER — Encounter: Payer: PPO | Admitting: Physical Therapy

## 2021-03-04 ENCOUNTER — Encounter: Payer: Self-pay | Admitting: Adult Health

## 2021-03-09 ENCOUNTER — Encounter: Payer: PPO | Admitting: Physical Therapy

## 2021-03-11 ENCOUNTER — Other Ambulatory Visit: Payer: Self-pay

## 2021-03-11 ENCOUNTER — Ambulatory Visit (INDEPENDENT_AMBULATORY_CARE_PROVIDER_SITE_OTHER): Payer: PPO | Admitting: Family Medicine

## 2021-03-11 ENCOUNTER — Encounter: Payer: Self-pay | Admitting: Family Medicine

## 2021-03-11 VITALS — BP 113/53 | HR 91 | Resp 16 | Ht 65.0 in | Wt 175.0 lb

## 2021-03-11 DIAGNOSIS — H15102 Unspecified episcleritis, left eye: Secondary | ICD-10-CM | POA: Diagnosis not present

## 2021-03-11 DIAGNOSIS — G43809 Other migraine, not intractable, without status migrainosus: Secondary | ICD-10-CM

## 2021-03-11 DIAGNOSIS — G3184 Mild cognitive impairment, so stated: Secondary | ICD-10-CM

## 2021-03-11 NOTE — Assessment & Plan Note (Signed)
She does more feel more sharp since coming off of the Ambien.

## 2021-03-11 NOTE — Progress Notes (Signed)
Subjective:     Cheryl [name] is a 81 y.o. female and is here for a comprehensive physical exam. The patient reports no problems.    She did want to let me know that overall she is feeling better in regards to her migraines that she did taper off of the Ambien and has noticed a significant improvement in her headaches.  She says definitely greater than 50% improvement she will still get an occasional mild 1.  She also feels more mentally clear since coming off the medication she still struggling with her sleep but says it is gradually getting better.  She does feel like she has been a little bit more active she admits during [place] she was just extremely sedentary and watches a lot of TV.  Social History   Socioeconomic History   Marital status: Divorced    Spouse name: Not on file   Number of children: 5   Years of education: Not on file   Highest education level: Not on file  Occupational History   Occupation: Retired      Fish farm manager: RETIRED  Tobacco Use   Smoking status: Former    Types: Cigarettes    Quit date: 02/07/1993    Years since quitting: 28.1   Smokeless tobacco: Never  Vaping Use   Vaping Use: Never used  Substance and Sexual Activity   Alcohol use: No   Drug use: No   Sexual activity: Not on file  Other Topics Concern   Not on file  Social History Narrative   Currently doing PT once a week. Caffeine- ginger ale occ.                         Social Determinants of Health   Financial Resource Strain: Not on file  Food Insecurity: Not on file  Transportation Needs: Not on file  Physical Activity: Not on file  Stress: Not on file  Social Connections: Not on file  Intimate Partner Violence: Not on file   Health Maintenance  Topic Date Due   TETANUS/TDAP  10/11/2022   COLONOSCOPY (Pts 45-25yrs Insurance coverage will need to be confirmed)  09/04/2025   Pneumonia Vaccine 30+ Years old  Completed   INFLUENZA VACCINE  Completed   DEXA SCAN  Completed    COVID-19 Vaccine  Completed   Hepatitis C Screening  Completed   Zoster Vaccines- Shingrix  Completed   HPV VACCINES  Aged Out    The following portions of the patient's history were reviewed and updated as appropriate: allergies, current medications, past family history, past medical history, past social history, past surgical history, and problem list.  Review of Systems Pertinent items are noted in HPI.   Objective:    BP (!) 113/53    Pulse 91    Resp 16    Ht 5\' 5"  (1.651 m)    Wt 175 lb (79.4 kg)    SpO2 97%    BMI 29.12 kg/m  General appearance: alert, cooperative, and appears stated age Head: Normocephalic, without obvious abnormality, atraumatic Eyes:  conj clear, EOMi, PEERLA Ears: normal TM's and external ear canals both ears Nose: Nares normal. Septum midline. Mucosa normal. No drainage or sinus tenderness. Throat: lips, mucosa, and tongue normal; teeth and gums normal Neck: no adenopathy, no carotid bruit, no JVD, supple, symmetrical, trachea midline, and thyroid not enlarged, symmetric, no tenderness/mass/nodules Back: symmetric, no curvature. ROM normal. No CVA tenderness. Lungs: clear to auscultation bilaterally Heart:  regular rate and rhythm, S1, S2 normal, no murmur, click, rub or gallop Abdomen: soft, non-tender; bowel sounds normal; no masses,  no organomegaly Extremities: extremities normal, atraumatic, no cyanosis or edema Pulses: 2+ and symmetric Skin: Skin color, texture, turgor normal. No rashes or lesions Lymph nodes: Cervical adenopathy: nl and Supraclavicular adenopathy: nl Neurologic: Grossly normal    Assessment:    Healthy female exam.      Plan:     See After Visit Summary for Counseling Recommendations  Keep up a regular exercise program and make sure you are eating a healthy diet Try to eat 4 servings of dairy a day, or if you are lactose intolerant take a calcium with vitamin D daily.  Your vaccines are up to date.  Maintenance is also  up-to-date.  Migraine headache Improved significantly after coming off of Ambien.  Now coping okay without sleep medication.  Mild cognitive impairment She does more feel more sharp since coming off of the Ambien.

## 2021-03-11 NOTE — Assessment & Plan Note (Addendum)
Improved significantly after coming off of Ambien.  Now coping okay without sleep medication.

## 2021-03-12 ENCOUNTER — Other Ambulatory Visit: Payer: Self-pay | Admitting: Family Medicine

## 2021-03-12 DIAGNOSIS — Z1231 Encounter for screening mammogram for malignant neoplasm of breast: Secondary | ICD-10-CM

## 2021-03-16 ENCOUNTER — Encounter: Payer: Self-pay | Admitting: Physical Therapy

## 2021-03-16 ENCOUNTER — Other Ambulatory Visit: Payer: Self-pay

## 2021-03-16 ENCOUNTER — Ambulatory Visit: Payer: PPO | Attending: Family Medicine | Admitting: Physical Therapy

## 2021-03-16 DIAGNOSIS — M546 Pain in thoracic spine: Secondary | ICD-10-CM | POA: Diagnosis not present

## 2021-03-16 DIAGNOSIS — Z9181 History of falling: Secondary | ICD-10-CM | POA: Diagnosis not present

## 2021-03-16 DIAGNOSIS — M545 Low back pain, unspecified: Secondary | ICD-10-CM | POA: Diagnosis not present

## 2021-03-16 DIAGNOSIS — G8929 Other chronic pain: Secondary | ICD-10-CM | POA: Diagnosis not present

## 2021-03-16 DIAGNOSIS — M6281 Muscle weakness (generalized): Secondary | ICD-10-CM | POA: Insufficient documentation

## 2021-03-16 NOTE — Therapy (Addendum)
[facility] @ [address] [place], [place], [address] Phone: [phone]   Fax:  [phone]  Physical Therapy Treatment  Patient Details  Name: Cheryl [name] MRN: [id number] Date of Birth: [birth year] Referring Provider (PT): Hali Marry, MD   Encounter Date: 03/16/2021   PT End of Session - 03/16/21 0845     Visit Number 9    Date for PT Re-Evaluation 04/06/21    Authorization Type [employer]    PT Start Time 0845    PT Stop Time 0929    PT Time Calculation (min) 44 min    Activity Tolerance Patient tolerated treatment well    Behavior During Therapy [facility] for tasks assessed/performed             Past Medical History:  Diagnosis Date   Anemia    Anxiety    d/t recent family issues   Arthritis    Bone spur    Carotid stenosis, bilateral 06/06/2019   Chronic back pain    stenosis and OA   Depression    takes Paxil daily   Diverticulosis    Dizzy    occasionally   Encephalitis    at 9 months ago    Fatty liver    has had 2 Hep shots and the final one is in June 18   GERD (gastroesophageal reflux disease)    takes Omeprazole daily   Hepatitis 2012   History of bronchitis    20-25 yrs ago   History of colon polyps    precancerous   History of shingles    Hyperlipidemia    takes Simvastatin daily   IBS (irritable bowel syndrome)    more on side of constipation-takes Miralax daily   Insomnia    takes Melatonin and Ambien nightly   Internal hemorrhoids    Joint pain    Joint swelling    Lung nodules    calcified    Migraine headache    migraines-thinks coming from neck.Last one1/28/18   Pneumonia    hx of-20 yrs ago-=-walking   Renal cyst    left   Uterine cancer ([place]) 1973    Past Surgical History:  Procedure Laterality Date   APPENDECTOMY  1966   cataracts     bilateral   COLONOSCOPY  09/04/2020   EUS  02/10/2011   Procedure: UPPER ENDOSCOPIC ULTRASOUND (EUS)  RADIAL;  Surgeon: Owens Loffler, MD;  Location: [facility] END[facility]COPY;  Service: Endoscopy;  Laterality: N/A;  radial linear    LUMBAR FUSION  03/2016   L4-5, Dr. Ola Spurr.   RECTOCELE REPAIR  2011   ROTATOR CUFF REPAIR  2009   left   TONSILLECTOMY     UPPER GASTROINTESTINAL ENDOSCOPY     VAGINAL HYSTERECTOMY  1973    There were no vitals filed for this visit.   Subjective Assessment - 03/16/21 0846     Subjective I am ready to discharge.  I can work around the house longer before the mid-back pain sets in.  The HEP helps and gives me more energy.  I only skip a day in between the HEP if I skip at all.  I have made up my mind I am going to start going to [employer] with a friend.    Pertinent History Hx of lumbar fusion L4/5 with maintained hardware, min scoliosis    Limitations Standing    How long can you sit comfortably? uses recliner    How  long can you stand comfortably? as long as I need to now    How long can you walk comfortably? depends on the day    Diagnostic tests lumbar and thoracic xrays: L4/5 fusion intact hardware, mod degen L3/4, min scoliosis    Patient Stated Goals get rid of pain, be able to work in kitchen without pain, household chores (vaccuum, mop)    Currently in Pain? No/denies                [facility] PT Assessment - 03/16/21 0001       Assessment   Medical Diagnosis M54.50 (ICD-10-CM) - Acute midline low back pain without sciatica    Referring Provider (PT) Hali Marry, MD    Onset Date/Surgical Date 11/16/20    Next MD Visit as needed    Prior Therapy yes, for her neck      Observation/Other Assessments   Focus on Therapeutic Outcomes (FOTO)  66%      Posture/Postural Control   Posture Comments much improved erect posture with forward head and rounded shoulder correction      Strength   Overall Strength Comments bil hips 4+/5, core and scapular strength 4+/5    Right/Left Knee Left    Left Knee Extension 4+/5      Standardized  Balance Assessment   Standardized Balance Assessment Five Times Sit to Stand    Five times sit to stand comments  15                           [facility] Adult PT Treatment/Exercise - 03/16/21 0001       Exercises   Exercises Shoulder;Neck;Lumbar;Knee/Hip      Lumbar Exercises: Aerobic   Nustep L5 x 6' PT present to discuss progress and review goals      Lumbar Exercises: Standing   Heel Raises 10 reps    Heel Raises Limitations w/ UE support      Lumbar Exercises: Seated   Long Arc Quad on Chair 1 set;15 reps      Knee/Hip Exercises: Stretches   Other Knee/Hip Stretches arch and soleus stretch 2x20 each      Knee/Hip Exercises: Standing   Hip Abduction Both;2 sets;5 sets;Knee straight    Hip Extension Stengthening;Both;2 sets;5 reps;Knee straight    Forward Step Up 1 set;5 reps;Step Height: 6";Hand Hold: 2      Knee/Hip Exercises: Seated   Other Seated Knee/Hip Exercises bil toe raises x 15    Sit to Sand 3 sets;5 reps;without UE support      Shoulder Exercises: Standing   Horizontal ABduction Strengthening;Both;10 reps;Theraband    Theraband Level (Shoulder Horizontal ABduction) Level 3 (Green)    External Rotation Strengthening;Both;15 reps;Theraband    Theraband Level (Shoulder External Rotation) Level 2 (Red)    Extension Strengthening;Both;10 reps;Theraband    Theraband Level (Shoulder Extension) Level 3 (Green)    Row Strengthening;Both;Theraband;15 reps    Theraband Level (Shoulder Row) Level 3 (Green)      Shoulder Exercises: ROM/Strengthening   Wall Pushups 10 reps      Shoulder Exercises: Stretch   Other Shoulder Stretches doorway stretch 3x10 sec alt feet for concurrent gastroc stretch                       PT Short Term Goals - 02/09/21 0948       PT SHORT TERM GOAL #1   Title Pt will be  ind with initial HEP without exacerbation of pain    Baseline discussed splitting program in 1/2 as needed    Status Achieved      PT  SHORT TERM GOAL #2   Title Pt will be able to demo 5x sit to stand without UE support from elevated surface in 18 sec or less    Baseline 18 sec with UE support    Status Partially Met      PT SHORT TERM GOAL #3   Title Pt will be able to perform 20 min of standing kitchen tasks before needing seated break.    Status Achieved      PT SHORT TERM GOAL #4   Title Pt will achieve at least 4/5 strength of Lt quad and tibialis anterior    Baseline 4+/5    Status Achieved      PT SHORT TERM GOAL #5   Title Pt will perform Rt/Lt SLS at counter with no more than single UE support x 20 sec    Status Achieved               PT Long Term Goals - 03/16/21 0853       PT LONG TERM GOAL #1   Title The patient will be independent in safe self progression of HEP    Status Achieved      PT LONG TERM GOAL #2   Title Pt will be able to perform standing household tasks such as cooking and light cleaning for up to 30 min before needing seated break.    Baseline up to several hours    Status Achieved      PT LONG TERM GOAL #3   Title Pt will improve FOTO score to at least 57% (from 44%) to demo improved function.    Baseline 66%    Status Achieved      PT LONG TERM GOAL #4   Title Pt will be able to perform 5x sit to stand from chair without UEs in 16 sec or less    Baseline 15'    Status Achieved      PT LONG TERM GOAL #5   Title Pt will improve postural stabilizers, scapular stabilizers and LE strength to at least 4+/5 for improved stairs and housework tolerance.    Status Achieved                   Plan - 03/16/21 8875     Clinical Impression Statement Pt has made significant progress with strength, energy, HEP understanding and compliance and postural awareness.  She has met all goals.  FOTO score is 66%, exceeding goal.  She is able to stand x 10 reps without UE assist.  She is working for hours at a time around the house before mid-back pain begins. She plans to begin  [employer] classes with a friend and continue HEP.  D/c to HEP at this time.    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    PT Frequency 1x / week    PT Duration 8 weeks    PT Treatment/Interventions ADLs/Self Care Home Management;Moist Heat;Electrical Stimulation;Stair training;Functional mobility training;Therapeutic activities;Therapeutic exercise;Balance training;Neuromuscular re-education;Patient/family education;Manual techniques;Dry needling    PT Next Visit Plan d/c    PT Home Exercise Plan Access Code: [email or web address]    Consulted and Agree with Plan of Care Patient  Patient will benefit from skilled therapeutic intervention in order to improve the following deficits and impairments:     Visit Diagnosis: Chronic bilateral low back pain without sciatica  Pain in thoracic spine  Muscle weakness (generalized)  History of falling     Problem List Patient Active Problem List   Diagnosis Date Noted   DDD (degenerative disc disease), cervical 03/01/2021   Stenosis of cervical spine with myelopathy ([place]) 03/01/2021   Mild cognitive impairment 02/15/2021   Pharyngoesophageal dysphagia 12/14/2020   Osteopenia 10/23/2020   Abnormal glucose 08/13/2020   Chronic migraine without aura, with intractable migraine, so stated, with status migrainosus 07/03/2019   Carotid stenosis, bilateral 06/06/2019   Migraine headache 03/22/2019   Daily headache 03/22/2019   Myofascial pain 03/27/2018   Numbness of left foot 11/07/2017   Right foot pain 09/12/2017   Deviated septum 01/25/2017   ETD (Eustachian tube dysfunction), bilateral 01/25/2017   Seasonal allergic rhinitis 01/25/2017   Bilateral hearing loss 01/25/2017   S/P lumbar fusion 06/20/2016   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   Fatty liver 12/22/2015   Onychodystrophy 11/24/2015   Hyperlipidemia 12/17/2013   Osteoarthritis of left  knee 07/05/2013   Obesity 03/10/2011   Dyspepsia 02/10/2011   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   PALPITATIONS 02/11/2010   OSTEOPENIA 11/27/2009   CONSTIPATION, CHRONIC 08/15/2008   Insomnia 07/08/2008   Chronic neck pain 04/11/2008   Disorder of kidney and ureter 01/21/2008   HEMORRHOIDS, INTERNAL 01/16/2008   DIVERTICULOSIS, COLON 01/16/2008   POSTMENOPAUSAL STATUS 12/05/2007   MICROSCOPIC HEMATURIA 06/05/2007   STENOSIS, LUMBAR SPINE 12/22/2005   Anxiety and depression 11/15/2005   IRRITABLE BOWEL SYNDROME 11/15/2005   ARTHRITIS 11/15/2005    PHYSICAL THERAPY DISCHARGE SUMMARY  Visits from Start of Care: 9  Current functional level related to goals / functional outcomes: Met all goals   Remaining deficits: See above   Education / Equipment: HEP  Patient agrees to discharge. Patient goals were met. Patient is being discharged due to meeting the stated rehab goals.  Deshanda Molitor, PT 03/16/21 9:30 AM  [facility] @ [address], [place], [address] Phone: [phone]   Fax:  [phone]  Name: Ethelda Deangelo MRN: [id number] Date of Birth: [birth year]

## 2021-03-17 ENCOUNTER — Telehealth: Payer: Self-pay | Admitting: *Deleted

## 2021-03-17 NOTE — Telephone Encounter (Signed)
Completed Roselyn Meier PA on Cover My Meds. Key: [id number]. Awaiting determination from [employer].

## 2021-03-18 ENCOUNTER — Ambulatory Visit
Admission: RE | Admit: 2021-03-18 | Discharge: 2021-03-18 | Disposition: A | Discharge status: Home / Self Care | Payer: PPO | Source: Ambulatory Visit | Attending: Neurology | Admitting: Neurology

## 2021-03-18 DIAGNOSIS — M4802 Spinal stenosis, cervical region: Secondary | ICD-10-CM

## 2021-03-18 DIAGNOSIS — R292 Abnormal reflex: Secondary | ICD-10-CM | POA: Diagnosis not present

## 2021-03-18 DIAGNOSIS — M542 Cervicalgia: Secondary | ICD-10-CM

## 2021-03-18 DIAGNOSIS — G992 Myelopathy in diseases classified elsewhere: Secondary | ICD-10-CM

## 2021-03-18 DIAGNOSIS — G8929 Other chronic pain: Secondary | ICD-10-CM

## 2021-03-18 DIAGNOSIS — M5030 Other cervical disc degeneration, unspecified cervical region: Secondary | ICD-10-CM

## 2021-03-18 DIAGNOSIS — R258 Other abnormal involuntary movements: Secondary | ICD-10-CM

## 2021-03-19 ENCOUNTER — Encounter: Payer: Self-pay | Admitting: Dermatology

## 2021-03-19 NOTE — Progress Notes (Signed)
° °  New Patient   Subjective  Cheryl [name] is a 81 y.o. female who presents for the following: Skin Problem (Check spots on left and right ear x months- tx- otc olay- refuses gown ).  General skin examination, check spots on ears Location:  Duration:  Quality:  Associated Signs/Symptoms: Modifying Factors:  Severity:  Timing: Context:    The following portions of the chart were reviewed this encounter and updated as appropriate:  Tobacco   Allergies   Meds   Problems   Med Hx   Surg Hx   Fam Hx       Objective  Well appearing patient in no apparent distress; mood and affect are within normal limits. All sun exposed areas plus back examined: No atypical pigmented lesions.  2 possible superficial carcinoma series will be biopsied.  Right Upper Back Brown textured 6 mm flattopped papule  Right Antihelix Centrally eroded 1 cm waxy pink crust       Left  Antihelix Pink waxy 1 cm crust       Mid Lower Vermilion Lip Soft blue compressible 6 mm dermal papule    All sun exposed areas plus back examined   Assessment & Plan  Seborrheic keratosis Right Upper Back  Leave if stable  Neoplasm of uncertain behavior of skin (2) Right Antihelix  Skin / nail biopsy Type of biopsy: tangential   Informed consent: discussed and consent obtained   Timeout: patient name, date of birth, surgical site, and procedure verified   Procedure prep:  Patient was prepped and draped in usual sterile fashion (Non sterile) Prep type:  Chlorhexidine Anesthesia: the lesion was anesthetized in a standard fashion   Anesthetic:  1% lidocaine w/ epinephrine 1-100,000 local infiltration Instrument used: flexible razor blade   Outcome: patient tolerated procedure well   Post-procedure details: wound care instructions given    Specimen 1 - Surgical pathology Differential Diagnosis: bcc vs scc  Check Margins: No  Left  Antihelix  Skin / nail biopsy Type of biopsy: tangential    Informed consent: discussed and consent obtained   Timeout: patient name, date of birth, surgical site, and procedure verified   Procedure prep:  Patient was prepped and draped in usual sterile fashion (Non sterile) Prep type:  Chlorhexidine Anesthesia: the lesion was anesthetized in a standard fashion   Anesthetic:  1% lidocaine w/ epinephrine 1-100,000 local infiltration Instrument used: flexible razor blade   Outcome: patient tolerated procedure well   Post-procedure details: wound care instructions given    Specimen 2 - Surgical pathology Differential Diagnosis: bcc vs scc  Check Margins: No  Venous lake Mid Lower [place]ilion Lip  Intervention not indicated  Encounter for screening for malignant neoplasm of skin  Annual skin examination.

## 2021-03-21 ENCOUNTER — Other Ambulatory Visit: Payer: Self-pay | Admitting: Family Medicine

## 2021-03-21 DIAGNOSIS — F419 Anxiety disorder, unspecified: Secondary | ICD-10-CM

## 2021-03-23 ENCOUNTER — Ambulatory Visit: Payer: PPO | Admitting: Physical Therapy

## 2021-03-24 ENCOUNTER — Other Ambulatory Visit: Payer: Self-pay | Admitting: Family Medicine

## 2021-03-24 DIAGNOSIS — F419 Anxiety disorder, unspecified: Secondary | ICD-10-CM

## 2021-04-09 ENCOUNTER — Encounter: Payer: Self-pay | Admitting: Family Medicine

## 2021-04-09 ENCOUNTER — Ambulatory Visit (INDEPENDENT_AMBULATORY_CARE_PROVIDER_SITE_OTHER): Payer: PPO | Admitting: Family Medicine

## 2021-04-09 ENCOUNTER — Other Ambulatory Visit: Payer: Self-pay

## 2021-04-09 VITALS — BP 116/50 | HR 68 | Temp 98.5°F | Ht 65.0 in | Wt 178.0 lb

## 2021-04-09 DIAGNOSIS — J40 Bronchitis, not specified as acute or chronic: Secondary | ICD-10-CM

## 2021-04-09 DIAGNOSIS — J329 Chronic sinusitis, unspecified: Secondary | ICD-10-CM

## 2021-04-09 DIAGNOSIS — H15102 Unspecified episcleritis, left eye: Secondary | ICD-10-CM | POA: Diagnosis not present

## 2021-04-09 MED ORDER — AMOXICILLIN-POT CLAVULANATE 875-125 MG PO TABS: 1.0000 | ORAL_TABLET | Freq: Two times a day (BID) | ORAL | 0 refills | Status: DC

## 2021-04-09 MED ORDER — CYCLOSPORINE 0.05 % OP EMUL: 1.0000 [drp] | Freq: Two times a day (BID) | OPHTHALMIC | 5 refills | Status: DC

## 2021-04-09 NOTE — Progress Notes (Addendum)
Acute Office Visit  Subjective:    Patient ID: Cheryl [name], female    DOB: Jan 19, 1941, 81 y.o.   MRN: [id number]  Chief Complaint  Patient presents with   Acute Visit   Sinusitis    HPI Patient is in today for symptoms of headache, sinus pressure and congestion with yellow drainage and cough for greater than 1 week.  She said initially it started as the headache and pressure so she thought maybe her headaches were coming back but then the other symptoms develop.  She did check her temperature for few days and brought that temperature log in today.  She did test for COVID 2 days ago.  She has been mostly using just over-the-counter sinus medications. Had ST but that is better now.   Past Medical History:  Diagnosis Date   Anemia    Anxiety    d/t recent family issues   Arthritis    Bone spur    Carotid stenosis, bilateral 06/06/2019   Chronic back pain    stenosis and OA   Depression    takes Paxil daily   Diverticulosis    Dizzy    occasionally   Encephalitis    at 9 months ago    Fatty liver    has had 2 Hep shots and the final one is in June 18   GERD (gastroesophageal reflux disease)    takes Omeprazole daily   Hepatitis 2012   History of bronchitis    20-25 yrs ago   History of colon polyps    precancerous   History of shingles    Hyperlipidemia    takes Simvastatin daily   IBS (irritable bowel syndrome)    more on side of constipation-takes Miralax daily   Insomnia    takes Melatonin and Ambien nightly   Internal hemorrhoids    Joint pain    Joint swelling    Lung nodules    calcified    Migraine headache    migraines-thinks coming from neck.Last one1/28/18   Pneumonia    hx of-20 yrs ago-=-walking   Renal cyst    left   Uterine cancer ([place]) 1973    Past Surgical History:  Procedure Laterality Date   APPENDECTOMY  1966   cataracts     bilateral   COLONOSCOPY  09/04/2020   EUS  02/10/2011   Procedure: UPPER ENDOSCOPIC ULTRASOUND (EUS)  RADIAL;  Surgeon: Owens Loffler, MD;  Location: [facility];  Service: Endoscopy;  Laterality: N/A;  radial linear    LUMBAR FUSION  03/2016   L4-5, Dr. Ola Spurr.   [place]   ROTATOR CUFF REPAIR  2009   left   TONSILLECTOMY     UPPER GASTROINTESTINAL ENDOSCOPY     VAGINAL HYSTERECTOMY  1973    Family History  Problem Relation Age of Onset   Kidney disease Mother    Kidney failure Mother        med induced   Rheum arthritis Mother    Heart disease Father    Alcohol abuse Father    Lung cancer Father        smoker   Depression Brother    Crohn's disease Brother    Alcohol abuse Brother    Heart failure Daughter    Lung cancer Son    HIV Son    Heart disease Other        Paternal family    Malignant hyperthermia Neg Hx    Colon  cancer Neg Hx    Esophageal cancer Neg Hx    Rectal cancer Neg Hx    Stomach cancer Neg Hx    Migraines Neg Hx     Social History   Socioeconomic History   Marital status: Divorced    Spouse name: Not on file   Number of children: 5   Years of education: Not on file   Highest education level: Not on file  Occupational History   Occupation: Retired      Fish farm manager: RETIRED  Tobacco Use   Smoking status: Former    Types: Cigarettes    Quit date: 02/07/1993    Years since quitting: 28.1   Smokeless tobacco: Never  Vaping Use   Vaping Use: Never used  Substance and Sexual Activity   Alcohol use: No   Drug use: No   Sexual activity: Not on file  Other Topics Concern   Not on file  Social History Narrative   Currently doing PT once a week. Caffeine- ginger ale occ.                         Social Determinants of Health   Financial Resource Strain: Not on file  Food Insecurity: Not on file  Transportation Needs: Not on file  Physical Activity: Not on file  Stress: Not on file  Social Connections: Not on file  Intimate Partner Violence: Not on file    Outpatient Medications Prior to Visit  Medication Sig  Dispense Refill   aspirin 81 MG tablet Take 81 mg by mouth daily.     Cholecalciferol (VITAMIN D) 1000 UNITS capsule Take 2,000 Units by mouth daily.     Evolocumab (REPATHA SURECLICK) 939 MG/ML SOAJ INJECT 140 MG INTO THE SKIN EVERY 14 (FOURTEEN) DAYS. 2 mL 11   fluticasone (FLONASE) 50 MCG/ACT nasal spray INSTILL 2 SPRAYS INTO EACH NOSTRIL EVERY DAY 48 mL 3   MAGNESIUM PO Take 1 tablet by mouth daily.      Omega-3 Fatty Acids (FISH OIL) 1000 MG CAPS Take 1 capsule (1,000 mg total) by mouth daily. (Patient taking differently: Take 2,000 mg by mouth daily.) 90 capsule 3   omeprazole (PRILOSEC) 20 MG capsule TAKE 1 CAPSULE TWICE DAILY BEFORE A MEAL 180 capsule 3   PARoxetine (PAXIL) 20 MG tablet TAKE 1 TABLET BY MOUTH EVERY DAY IN THE MORNING 90 tablet 1   PARoxetine (PAXIL) 40 MG tablet Take 1 tablet (40 mg total) by mouth daily. 90 tablet 1   polyethylene glycol (MIRALAX / GLYCOLAX) packet Take 17 g by mouth daily.     traMADol (ULTRAM) 50 MG tablet Take 2 tablets (100 mg total) by mouth daily as needed. 180 tablet 1   Ubrogepant (UBRELVY) 100 MG TABS Take 100 mg by mouth every 2 (two) hours as needed. Maximum 229m a day. 16 tablet 11   RESTASIS 0.05 % ophthalmic emulsion Place 1 drop into both eyes 2 (two) times daily.      No facility-administered medications prior to visit.    Allergies  Allergen Reactions   Celebrex [Celecoxib] Itching, Nausea Only and Other (See Comments)    Gas, hair loss   Fluoxetine Hcl Shortness Of Breath and Itching    REACTION: itching,SOB   Ambien [Zolpidem] Other (See Comments)    Headaches    Amitriptyline     Dry mouth    Contrast Media [Iodinated Contrast Media] Itching    Multilance.    Dexamethasone Diarrhea  and Nausea And Vomiting   Livalo [Pitavastatin] Other (See Comments)    Myalgia    Metrizamide Itching    Multilance.    Nitroglycerin     Nausea,dizziness,sweats   Rosuvastatin Other (See Comments)    bodyaches and cramping    Simvastatin Other (See Comments)    Muscle aches and HA   Codeine Sulfate Itching    REACTION: itching   Sulfasalazine Nausea Only    REACTION: Nausea   Sulfonamide Derivatives Nausea Only    REACTION: Nausea    Review of Systems     Objective:    Physical Exam Constitutional:      Appearance: She is well-developed.  HENT:     Head: Normocephalic and atraumatic.     Right Ear: External ear normal.     Left Ear: External ear normal.     Nose: Nose normal.  Eyes:     Conjunctiva/sclera: Conjunctivae normal.     Pupils: Pupils are equal, round, and reactive to light.  Neck:     Thyroid: No thyromegaly.  Cardiovascular:     Rate and Rhythm: Normal rate and regular rhythm.     Heart sounds: Normal heart sounds.  Pulmonary:     Effort: Pulmonary effort is normal.     Breath sounds: Normal breath sounds. No wheezing.  Musculoskeletal:     Cervical back: Neck supple.  Lymphadenopathy:     Cervical: No cervical adenopathy.  Skin:    General: Skin is warm and dry.  Neurological:     Mental Status: She is alert and oriented to person, place, and time.    BP (!) 116/50 (BP Location: Left Arm, Patient Position: Sitting, Cuff Size: Large)    Pulse 68    Temp 98.5 F (36.9 C)    Ht 5' 5"  (1.651 m)    Wt 178 lb (80.7 kg)    SpO2 98%    BMI 29.62 kg/m  Wt Readings from Last 3 Encounters:  04/09/21 178 lb (80.7 kg)  03/11/21 175 lb (79.4 kg)  03/01/21 175 lb 9.6 oz (79.7 kg)    There are no preventive care reminders to display for this patient.  There are no preventive care reminders to display for this patient.   Lab Results  Component Value Date   TSH 2.94 02/15/2021   Lab Results  Component Value Date   WBC 6.8 02/15/2021   HGB 14.5 02/15/2021   HCT 43.4 02/15/2021   MCV 87.5 02/15/2021   PLT 285 02/15/2021   Lab Results  Component Value Date   NA 140 02/15/2021   K 4.7 02/15/2021   CO2 28 02/15/2021   GLUCOSE 85 02/15/2021   BUN 16 02/15/2021    CREATININE 0.84 02/15/2021   BILITOT 0.4 02/15/2021   ALKPHOS 96 12/07/2020   AST 29 02/15/2021   ALT 25 02/15/2021   PROT 7.0 02/15/2021   ALBUMIN 4.2 12/07/2020   CALCIUM 10.2 02/15/2021   ANIONGAP 9 03/07/2016   EGFR 71 02/15/2021   Lab Results  Component Value Date   CHOL 185 12/07/2020   Lab Results  Component Value Date   HDL 48 12/07/2020   Lab Results  Component Value Date   LDLCALC 109 (H) 12/07/2020   Lab Results  Component Value Date   TRIG 161 (H) 12/07/2020   Lab Results  Component Value Date   CHOLHDL 3.9 12/07/2020   Lab Results  Component Value Date   HGBA1C 5.6 02/15/2021  Assessment & Plan:   Problem List Items Addressed This Visit   None Visit Diagnoses     Episcleritis of left eye    -  Primary   Relevant Medications   cycloSPORINE (RESTASIS) 0.05 % ophthalmic emulsion   Sinobronchitis       Relevant Medications   amoxicillin-clavulanate (AUGMENTIN) 875-125 MG tablet   HYDROcodone bit-homatropine (HYCODAN) 5-1.5 MG/5ML syrup      Sinobronchitis-symptoms persistent for greater than 1 week not really improving.  We will go ahead and treat with Augmentin.  If not better in 1 week then please give [name] a call back.  Also refilled her Restasis today.  Meds ordered this encounter  Medications   cycloSPORINE (RESTASIS) 0.05 % ophthalmic emulsion    Sig: Place 1 drop into both eyes 2 (two) times daily.    Dispense:  5.5 mL    Refill:  5   amoxicillin-clavulanate (AUGMENTIN) 875-125 MG tablet    Sig: Take 1 tablet by mouth 2 (two) times daily.    Dispense:  14 tablet    Refill:  0   HYDROcodone bit-homatropine (HYCODAN) 5-1.5 MG/5ML syrup    Sig: Take 5 mLs by mouth every 8 (eight) hours as needed for cough.    Dispense:  70 mL    Refill:  0     [name], MD

## 2021-04-12 ENCOUNTER — Encounter: Payer: Self-pay | Admitting: Family Medicine

## 2021-04-13 MED ORDER — HYDROCODONE BIT-HOMATROP MBR 5-1.5 MG/5ML PO SOLN: 5.0000 mL | Freq: Three times a day (TID) | ORAL | 0 refills | Status: DC | PRN

## 2021-04-13 NOTE — Addendum Note (Signed)
Addended by: [name] D on: 04/13/2021 07:50 AM ? ? Modules accepted: Orders ? ?

## 2021-04-20 DIAGNOSIS — M47812 Spondylosis without myelopathy or radiculopathy, cervical region: Secondary | ICD-10-CM | POA: Diagnosis not present

## 2021-04-20 DIAGNOSIS — M546 Pain in thoracic spine: Secondary | ICD-10-CM | POA: Diagnosis not present

## 2021-04-20 DIAGNOSIS — G894 Chronic pain syndrome: Secondary | ICD-10-CM | POA: Diagnosis not present

## 2021-04-20 DIAGNOSIS — Z6829 Body mass index (BMI) 29.0-29.9, adult: Secondary | ICD-10-CM | POA: Diagnosis not present

## 2021-05-03 ENCOUNTER — Ambulatory Visit: Payer: PPO | Admitting: Physician Assistant

## 2021-05-08 ENCOUNTER — Other Ambulatory Visit: Payer: Self-pay | Admitting: Cardiovascular Disease

## 2021-05-08 ENCOUNTER — Other Ambulatory Visit: Payer: Self-pay | Admitting: Family Medicine

## 2021-05-08 DIAGNOSIS — F419 Anxiety disorder, unspecified: Secondary | ICD-10-CM

## 2021-05-12 ENCOUNTER — Encounter: Payer: Self-pay | Admitting: Physician Assistant

## 2021-05-12 ENCOUNTER — Ambulatory Visit: Payer: PPO | Admitting: Physician Assistant

## 2021-05-12 ENCOUNTER — Encounter: Payer: Self-pay | Admitting: Family Medicine

## 2021-05-12 VITALS — BP 124/70 | HR 77 | Ht 65.0 in | Wt 179.4 lb

## 2021-05-12 DIAGNOSIS — R1314 Dysphagia, pharyngoesophageal phase: Secondary | ICD-10-CM | POA: Diagnosis not present

## 2021-05-12 DIAGNOSIS — K219 Gastro-esophageal reflux disease without esophagitis: Secondary | ICD-10-CM | POA: Diagnosis not present

## 2021-05-12 DIAGNOSIS — K5730 Diverticulosis of large intestine without perforation or abscess without bleeding: Secondary | ICD-10-CM | POA: Diagnosis not present

## 2021-05-12 DIAGNOSIS — K5732 Diverticulitis of large intestine without perforation or abscess without bleeding: Secondary | ICD-10-CM

## 2021-05-12 NOTE — Progress Notes (Signed)
? ?Subjective:  ? ? Patient ID: Cheryl [name], female    DOB: [birth year], 81 y.o.   MRN: [id number] ? ?HPI ?Cheryl [name] is a pleasant 81 year old white female, established with Dr. Henrene Pastor.  She was last seen here in July 2022 when she underwent colonoscopy for history of multiple adenomatous colon polyps.  She was found to have multiple AVMs in the cecum, nonbleeding multiple diverticuli throughout the colon and no polyps.  No repeat surveillance due to age ?She had very remote EGD in 2002 which was normal. ?She comes in today with complaints of dysphagia.  She does have history of chronic GERD and has been on omeprazole 20 mg p.o. daily over the past several months.  She says she had been on twice daily dosing at 1 point but was not sure that she needed it and has not felt any different with once daily dosing.  She does not have any heartburn or indigestion. ?She has been noticing intermittent episodes of solid food dysphagia over the past few months.  This typically occurs with bread or meat and any larger pieces of food.  She has modified her diet somewhat, and is being very careful with what she eats.  She is not having symptoms with pills or liquids.  Currently the episodes are occurring once a week or so.  She has to stop eating and drink fluids to get the food to pass, no episodes requiring regurgitation.  No nausea or vomiting, no complaints of abdominal pain. ?Other medical problems include migraine headaches, carotid disease, granulomatous lung disease, IBS, mild cognitive impairment and lumbar stenosis. ?She is not on any blood thinners.  No significant cardiopulmonary disease. ? ?Review of Systems. Pertinent positive and negative review of systems were noted in the above HPI section.  All other review of systems was otherwise negative.  ? ?Outpatient Encounter Medications as of 05/12/2021  ?Medication Sig  ? aspirin 81 MG tablet Take 81 mg by mouth daily.  ? Cholecalciferol (VITAMIN D) 1000 UNITS capsule Take  2,000 Units by mouth daily.  ? cycloSPORINE (RESTASIS) 0.05 % ophthalmic emulsion Place 1 drop into both eyes 2 (two) times daily.  ? fluticasone (FLONASE) 50 MCG/ACT nasal spray INSTILL 2 SPRAYS INTO EACH NOSTRIL EVERY DAY  ? HYDROcodone bit-homatropine (HYCODAN) 5-1.5 MG/5ML syrup Take 5 mLs by mouth every 8 (eight) hours as needed for cough.  ? MAGNESIUM PO Take 1 tablet by mouth daily.   ? Omega-3 Fatty Acids (FISH OIL) 1000 MG CAPS Take 1 capsule (1,000 mg total) by mouth daily. (Patient taking differently: Take 2,000 mg by mouth daily.)  ? omeprazole (PRILOSEC) 20 MG capsule TAKE 1 CAPSULE TWICE DAILY BEFORE A MEAL  ? PARoxetine (PAXIL) 40 MG tablet TAKE 1 TABLET BY MOUTH EVERY DAY  ? polyethylene glycol (MIRALAX / GLYCOLAX) packet Take 17 g by mouth daily.  ? REPATHA SURECLICK 841 MG/ML SOAJ INJECT 140 MG INTO THE SKIN EVERY 14 (FOURTEEN) DAYS.  ? traMADol (ULTRAM) 50 MG tablet Take 2 tablets (100 mg total) by mouth daily as needed.  ? Ubrogepant (UBRELVY) 100 MG TABS Take 100 mg by mouth every 2 (two) hours as needed. Maximum '200mg'$  a day.  ? [DISCONTINUED] amoxicillin-clavulanate (AUGMENTIN) 875-125 MG tablet Take 1 tablet by mouth 2 (two) times daily.  ? [DISCONTINUED] PARoxetine (PAXIL) 20 MG tablet TAKE 1 TABLET BY MOUTH EVERY DAY IN THE MORNING  ? ?No facility-administered encounter medications on file as of 05/12/2021.  ? ?Allergies  ?Allergen Reactions  ?  Celebrex [Celecoxib] Itching, Nausea Only and Other (See Comments)  ?  Gas, hair loss  ? Fluoxetine Hcl Shortness Of Breath and Itching  ?  REACTION: itching,SOB  ? Ambien [Zolpidem] Other (See Comments)  ?  Headaches   ? Amitriptyline   ?  Dry mouth ?  ? Contrast Media [Iodinated Contrast Media] Itching  ?  Multilance.   ? Dexamethasone Diarrhea and Nausea And Vomiting  ? Livalo [Pitavastatin] Other (See Comments)  ?  Myalgia   ? Metrizamide Itching  ?  Multilance.   ? Nitroglycerin   ?  Nausea,dizziness,sweats  ? Rosuvastatin Other (See Comments)  ?   bodyaches and cramping  ? Simvastatin Other (See Comments)  ?  Muscle aches and HA  ? Codeine Sulfate Itching  ?  REACTION: itching  ? Sulfasalazine Nausea Only  ?  REACTION: Nausea  ? Sulfonamide Derivatives Nausea Only  ?  REACTION: Nausea  ? ?Patient Active Problem List  ? Diagnosis Date Noted  ? DDD (degenerative disc disease), cervical 03/01/2021  ? 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 Mild cognitive impairment 02/15/2021  ? Pharyngoesophageal dysphagia 12/14/2020  ? Osteopenia 10/23/2020  ? Abnormal glucose 08/13/2020  ? Chronic migraine without aura, with intractable migraine, so stated, with status migrainosus 07/03/2019  ? Carotid stenosis, bilateral 06/06/2019  ? Migraine headache 03/22/2019  ? Daily headache 03/22/2019  ? Myofascial pain 03/27/2018  ? Numbness of left foot 11/07/2017  ? Right foot pain 09/12/2017  ? Deviated septum 01/25/2017  ? ETD (Eustachian tube dysfunction), bilateral 01/25/2017  ? Seasonal allergic rhinitis 01/25/2017  ? Bilateral hearing loss 01/25/2017  ? S/P lumbar fusion 06/20/2016  ? 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  ? Fatty liver 12/22/2015  ? Onychodystrophy 11/24/2015  ? Hyperlipidemia 12/17/2013  ? Osteoarthritis of left knee 07/05/2013  ? Obesity 03/10/2011  ? Dyspepsia 02/10/2011  ? 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 PALPITATIONS 02/11/2010  ? OSTEOPENIA 11/27/2009  ? CONSTIPATION, CHRONIC 08/15/2008  ? Insomnia 07/08/2008  ? Chronic neck pain 04/11/2008  ? Disorder of kidney and ureter 01/21/2008  ? HEMORRHOIDS, INTERNAL 01/16/2008  ? Diverticulosis of colon 01/16/2008  ? POSTMENOPAUSAL STATUS 12/05/2007  ? MICROSCOPIC HEMATURIA 06/05/2007  ? STENOSIS, LUMBAR SPINE 12/22/2005  ? Anxiety and depression 11/15/2005  ? IRRITABLE BOWEL SYNDROME 11/15/2005  ? ARTHRITIS 11/15/2005  ? ?Social History  ? ?Socioeconomic History  ? Marital status: Divorced  ?  Spouse name: Not on file  ? Number of children: 5  ? Years of  education: Not on file  ? Highest education level: Not on file  ?Occupational History  ? Occupation: Retired    ?  Employer: RETIRED  ?Tobacco Use  ? Smoking status: Former  ?  Types: Cigarettes  ?  Quit date: 02/07/1993  ?  Years since quitting: 28.2  ? Smokeless tobacco: Never  ?Vaping Use  ? Vaping Use: Never used  ?Substance and Sexual Activity  ? Alcohol use: No  ? Drug use: No  ? Sexual activity: Not on file  ?Other Topics Concern  ? Not on file  ?Social History Narrative  ? Currently doing PT once a week. Caffeine- ginger ale occ.   ?   ?   ?   ?   ?   ?   ?   ? ?Social Determinants of Health  ? ?Financial Resource Strain: Not on file  ?Food Insecurity: Not on file  ?Transportation Needs: Not on file  ?Physical Activity:  Not on file  ?Stress: Not on file  ?Social Connections: Not on file  ?Intimate Partner Violence: Not on file  ? ? ?Cheryl [name] family history includes Alcohol abuse in her brother and father; Crohn's disease in her brother; Depression in her brother; HIV in her son; Heart disease in her father and another family member; Heart failure in her daughter; Kidney disease in her mother; Kidney failure in her mother; Lung cancer in her father and son; Rheum arthritis in her mother. ? ? ?   ?Objective:  ?  ?Vitals:  ? 05/12/21 1506 05/12/21 1548  ?BP: 132/76 124/70  ?Pulse: 77   ? ? ?Physical Exam Well-developed well-nourished really white female in no acute distress.  Pleasant  Weight, 179 BMI 29.8 ? ?HEENT; nontraumatic normocephalic, EOMI, PE R LA, sclera anicteric. ?Oropharynx; not examined today ?Neck; supple, no JVD ?Cardiovascular; regular rate and rhythm with S1-S2, no murmur rub or gallop ?Pulmonary; Clear bilaterally ?Abdomen; soft, nontender, nondistended, no palpable mass or hepatosplenomegaly, bowel sounds are active ?Rectal; not done today ?Skin; benign exam, no jaundice rash or appreciable lesions ?Extremities; no clubbing cyanosis or edema skin warm and dry ?Neuro/Psych; alert and  oriented x4, grossly nonfocal mood and affect appropriate  ? ? ? ?   ?Assessment & Plan:  ? ?#61 82 year old white female with history of chronic GERD on low-dose omeprazole once daily presenting now with 3 to

## 2021-05-12 NOTE — Patient Instructions (Signed)
If you are age 79 or older, your body mass index should be between 23-30. Your Body mass index is 29.85 kg/m?Marland Kitchen If this is out of the aforementioned range listed, please consider follow up with your Primary Care Provider. ?________________________________________________________ ? ?The [facility] providers would like to encourage you to use [facility] to communicate with providers for non-urgent requests or questions.  Due to long hold times on the telephone, sending your provider a message by [facility] may be a faster and more efficient way to get a response.  Please allow 48 business hours for a response.  Please remember that this is for non-urgent requests.  ?_______________________________________________________ ? ?You have been scheduled for an endoscopy. Please follow written instructions given to you at your visit today. ?If you use inhalers (even only as needed), please bring them with you on the day of your procedure. ? ?Continue Omeprazole 20 mg 1 capsule every morning ? ?Thank you for entrusting me with your care and choosing [facility]. ? ?Nicoletta Ba, PA-C ?

## 2021-05-13 NOTE — Progress Notes (Signed)
Assessment and plans noted ?

## 2021-05-17 ENCOUNTER — Encounter: Payer: Self-pay | Admitting: Family Medicine

## 2021-05-17 ENCOUNTER — Ambulatory Visit
Admission: RE | Admit: 2021-05-17 | Discharge: 2021-05-17 | Disposition: A | Discharge status: Home / Self Care | Payer: PPO | Source: Ambulatory Visit | Attending: Family Medicine | Admitting: Family Medicine

## 2021-05-17 DIAGNOSIS — Z1231 Encounter for screening mammogram for malignant neoplasm of breast: Secondary | ICD-10-CM | POA: Diagnosis not present

## 2021-05-17 NOTE — Progress Notes (Signed)
Hi Virna,  just wanted to let you know that your mammogram showed a little bit of asymmetry in the right and left breast so they are recommending diagnostic mammogram and possible ultrasound.  The imaging department will be contacting you to schedule this.

## 2021-05-18 ENCOUNTER — Other Ambulatory Visit: Payer: Self-pay | Admitting: Family Medicine

## 2021-05-18 DIAGNOSIS — R928 Other abnormal and inconclusive findings on diagnostic imaging of breast: Secondary | ICD-10-CM

## 2021-06-08 ENCOUNTER — Telehealth: Payer: Self-pay | Admitting: Neurology

## 2021-06-08 NOTE — Telephone Encounter (Signed)
Went ahead and mailed a blank application to patient if she decides to pursue this.  ?

## 2021-06-08 NOTE — Telephone Encounter (Signed)
Working on PA

## 2021-06-08 NOTE — Telephone Encounter (Signed)
Pt said went to the pharmacy to pick up Ubrogepant (UBRELVY) 100 MG TABS and cost $444. Did not pay for the medication. Have you in touch with [facility] to continue patient assist with cost of the medication. Would like a call from the nurse. ?

## 2021-06-08 NOTE — Telephone Encounter (Signed)
Attempted another PA on Cover My Meds. KEY: [id number]. Received this message from plan: Prior Authorization duplicate/approved.  ?

## 2021-06-11 ENCOUNTER — Ambulatory Visit: Payer: PPO

## 2021-06-11 ENCOUNTER — Ambulatory Visit (HOSPITAL_COMMUNITY)
Admission: RE | Admit: 2021-06-11 | Discharge: 2021-06-11 | Disposition: A | Discharge status: Home / Self Care | Payer: PPO | Source: Ambulatory Visit | Attending: Cardiovascular Disease | Admitting: Cardiovascular Disease

## 2021-06-11 ENCOUNTER — Ambulatory Visit
Admission: RE | Admit: 2021-06-11 | Discharge: 2021-06-11 | Disposition: A | Discharge status: Home / Self Care | Payer: PPO | Source: Ambulatory Visit | Attending: Family Medicine | Admitting: Family Medicine

## 2021-06-11 DIAGNOSIS — E7800 Pure hypercholesterolemia, unspecified: Secondary | ICD-10-CM | POA: Insufficient documentation

## 2021-06-11 DIAGNOSIS — Z5181 Encounter for therapeutic drug level monitoring: Secondary | ICD-10-CM | POA: Diagnosis not present

## 2021-06-11 DIAGNOSIS — R928 Other abnormal and inconclusive findings on diagnostic imaging of breast: Secondary | ICD-10-CM

## 2021-06-11 DIAGNOSIS — I10 Essential (primary) hypertension: Secondary | ICD-10-CM | POA: Diagnosis not present

## 2021-06-11 DIAGNOSIS — I6523 Occlusion and stenosis of bilateral carotid arteries: Secondary | ICD-10-CM | POA: Insufficient documentation

## 2021-06-11 LAB — COMPREHENSIVE METABOLIC PANEL
ALT: 30 IU/L (ref 0–32)
AST: 33 IU/L (ref 0–40)
Albumin/Globulin Ratio: 1.6 (ref 1.2–2.2)
Albumin: 4.2 g/dL (ref 3.7–4.7)
Alkaline Phosphatase: 89 IU/L (ref 44–121)
BUN/Creatinine Ratio: 16 (ref 12–28)
BUN: 14 mg/dL (ref 8–27)
Bilirubin Total: 0.3 mg/dL (ref 0.0–1.2)
CO2: 25 mmol/L (ref 20–29)
Calcium: 9.7 mg/dL (ref 8.7–10.3)
Chloride: 101 mmol/L (ref 96–106)
Creatinine, Ser: 0.85 mg/dL (ref 0.57–1.00)
Globulin, Total: 2.6 g/dL (ref 1.5–4.5)
Glucose: 149 mg/dL — ABNORMAL HIGH (ref 70–99)
Potassium: 4.3 mmol/L (ref 3.5–5.2)
Sodium: 138 mmol/L (ref 134–144)
Total Protein: 6.8 g/dL (ref 6.0–8.5)
eGFR: 70 mL/min/{1.73_m2} (ref >59)

## 2021-06-11 NOTE — Progress Notes (Signed)
Please call patient. Normal mammogram.  Repeat in 1 year.  

## 2021-06-14 ENCOUNTER — Encounter: Payer: Self-pay | Admitting: Family Medicine

## 2021-06-14 ENCOUNTER — Ambulatory Visit (INDEPENDENT_AMBULATORY_CARE_PROVIDER_SITE_OTHER): Payer: PPO | Admitting: Family Medicine

## 2021-06-14 VITALS — BP 110/41 | HR 81 | Resp 18 | Ht 65.0 in | Wt 180.0 lb

## 2021-06-14 DIAGNOSIS — I10 Essential (primary) hypertension: Secondary | ICD-10-CM | POA: Diagnosis not present

## 2021-06-14 DIAGNOSIS — Z5181 Encounter for therapeutic drug level monitoring: Secondary | ICD-10-CM | POA: Diagnosis not present

## 2021-06-14 DIAGNOSIS — N760 Acute vaginitis: Secondary | ICD-10-CM | POA: Diagnosis not present

## 2021-06-14 DIAGNOSIS — R300 Dysuria: Secondary | ICD-10-CM | POA: Diagnosis not present

## 2021-06-14 DIAGNOSIS — E7800 Pure hypercholesterolemia, unspecified: Secondary | ICD-10-CM | POA: Diagnosis not present

## 2021-06-14 DIAGNOSIS — L600 Ingrowing nail: Secondary | ICD-10-CM | POA: Diagnosis not present

## 2021-06-14 DIAGNOSIS — N3000 Acute cystitis without hematuria: Secondary | ICD-10-CM | POA: Diagnosis not present

## 2021-06-14 LAB — LIPID PANEL
Chol/HDL Ratio: 3.5 ratio (ref 0.0–4.4)
Cholesterol, Total: 146 mg/dL (ref 100–199)
HDL: 42 mg/dL (ref >39)
LDL Chol Calc (NIH): 72 mg/dL (ref 0–99)
Triglycerides: 189 mg/dL — ABNORMAL HIGH (ref 0–149)
VLDL Cholesterol Cal: 32 mg/dL (ref 5–40)

## 2021-06-14 LAB — WET PREP FOR TRICH, YEAST, CLUE
MICRO NUMBER:: 13364535
Specimen Quality: ADEQUATE

## 2021-06-14 LAB — POCT URINALYSIS DIP (CLINITEK)
Bilirubin, UA: NEGATIVE
Glucose, UA: 100 mg/dL — AB
Ketones, POC UA: NEGATIVE mg/dL
Nitrite, UA: POSITIVE — AB
POC PROTEIN,UA: 30 — AB
Spec Grav, UA: <=1.005 — AB (ref 1.010–1.025)
Urobilinogen, UA: 1 E.U./dL
pH, UA: 5 (ref 5.0–8.0)

## 2021-06-14 MED ORDER — NITROFURANTOIN MONOHYD MACRO 100 MG PO CAPS: 100.0000 mg | ORAL_CAPSULE | Freq: Two times a day (BID) | ORAL | 0 refills | Status: DC

## 2021-06-14 NOTE — Progress Notes (Signed)
Hi Canary, no sign of yeast or bacteria on the vaginal swab. I did send over an antibiotic for your bladder.

## 2021-06-14 NOTE — Progress Notes (Signed)
? ?  Acute Office Visit ? ?Subjective:  ? ?  ?Patient ID: Cheryl [name], female    DOB: Mar 22, 1940, 81 y.o.   MRN: [id number] ? ?Chief Complaint  ?Patient presents with  ? Ingrown Toenail  ?  Left big toe   ? Vaginal Itching   ?  Several days   ? Urinary Tract Infection  ?  Burning with urination for several days   ? ? ?HPI ?Patient is in today for ingrown nail on the left side. We have removed the nail before but now it is getting tender and sore again.   ? ?Vaginal irritation and burning with urination for a few days. No fever or chills. Taking AZO.  No hematuria. ?No recent antibiotics. ? ?ROS ? ? ?   ?Objective:  ?  ?BP (!) 110/41   Pulse 81   Resp 18   Ht '5\' 5"'$  (1.651 m)   Wt 180 lb (81.6 kg)   SpO2 97%   BMI 29.95 kg/m?  ? ? ?Physical Exam ? ?Results for orders placed or performed in visit on 06/14/21  ?POCT URINALYSIS DIP (CLINITEK)  ?Result Value Ref Range  ? Color, UA orange (A) yellow  ? Clarity, UA cloudy (A) clear  ? Glucose, UA =100 (A) negative mg/dL  ? Bilirubin, UA negative negative  ? Ketones, POC UA negative negative mg/dL  ? Spec Grav, UA <=1.005 (A) 1.010 - 1.025  ? Blood, UA small (A) negative  ? pH, UA 5.0 5.0 - 8.0  ? POC PROTEIN,UA =30 (A) negative, trace  ? Urobilinogen, UA 1.0 0.2 or 1.0 E.U./dL  ? Nitrite, UA Positive (A) Negative  ? Leukocytes, UA Large (3+) (A) Negative  ? ? ? ?   ?Assessment & Plan:  ? ?Problem List Items Addressed This Visit   ?None ?Visit Diagnoses   ? ? Acute vaginitis    -  Primary  ? Relevant Orders  ? WET PREP FOR [place], YEAST, CLUE  ? Dysuria      ? Relevant Medications  ? nitrofurantoin, macrocrystal-monohydrate, (MACROBID) 100 MG capsule  ? Other Relevant Orders  ? POCT URINALYSIS DIP (CLINITEK) (Completed)  ? Urine Culture  ? Ingrown toenail of left foot      ? Relevant Orders  ? Ambulatory referral to Podiatry  ? Acute cystitis without hematuria      ? Relevant Medications  ? nitrofurantoin, macrocrystal-monohydrate, (MACROBID) 100 MG capsule  ? ?   ? ?Vaginitis-wet prep performed.  We will call the results once available. ? ?Dysuria-urinalysis is positive for leukocytes and nitrates.  We will go ahead and send for culture but I am to go ahead and treat her for now. ? ?Ingrown toenail of left great toe-we will refer to podiatry for formal treatment to try to see if they can get the nail to grow back more narrow. ? ?Meds ordered this encounter  ?Medications  ? nitrofurantoin, macrocrystal-monohydrate, (MACROBID) 100 MG capsule  ?  Sig: Take 1 capsule (100 mg total) by mouth 2 (two) times daily.  ?  Dispense:  10 capsule  ?  Refill:  0  ? ? ?No follow-ups on file. ? ?[name], MD ? ? ?

## 2021-06-14 NOTE — Progress Notes (Signed)
Hi Dehlia,  based on the dipstick and get a go ahead and send over a prescription for a UTI.  We are sending a culture as well.

## 2021-06-17 ENCOUNTER — Ambulatory Visit (AMBULATORY_SURGERY_CENTER): Payer: PPO | Admitting: Internal Medicine

## 2021-06-17 ENCOUNTER — Encounter: Payer: Self-pay | Admitting: Internal Medicine

## 2021-06-17 VITALS — BP 116/61 | HR 68 | Temp 98.0°F | Resp 19 | Ht 65.0 in | Wt 179.0 lb

## 2021-06-17 DIAGNOSIS — K222 Esophageal obstruction: Secondary | ICD-10-CM

## 2021-06-17 DIAGNOSIS — R1319 Other dysphagia: Secondary | ICD-10-CM | POA: Diagnosis not present

## 2021-06-17 DIAGNOSIS — K449 Diaphragmatic hernia without obstruction or gangrene: Secondary | ICD-10-CM | POA: Diagnosis not present

## 2021-06-17 DIAGNOSIS — B3781 Candidal esophagitis: Secondary | ICD-10-CM

## 2021-06-17 DIAGNOSIS — E669 Obesity, unspecified: Secondary | ICD-10-CM | POA: Diagnosis not present

## 2021-06-17 DIAGNOSIS — K317 Polyp of stomach and duodenum: Secondary | ICD-10-CM | POA: Diagnosis not present

## 2021-06-17 DIAGNOSIS — K219 Gastro-esophageal reflux disease without esophagitis: Secondary | ICD-10-CM | POA: Diagnosis not present

## 2021-06-17 DIAGNOSIS — R1310 Dysphagia, unspecified: Secondary | ICD-10-CM | POA: Diagnosis not present

## 2021-06-17 MED ORDER — SODIUM CHLORIDE 0.9 % IV SOLN: 500.0000 mL | Freq: Once | INTRAVENOUS | Status: DC

## 2021-06-17 MED ORDER — CLOTRIMAZOLE 10 MG MT TROC: 10.0000 mg | Freq: Every day | OROMUCOSAL | 0 refills | Status: DC

## 2021-06-17 NOTE — Progress Notes (Signed)
Called to room to assist during endoscopic procedure.  Patient ID and intended procedure confirmed with present staff. Received instructions for my participation in the procedure from the performing physician.  

## 2021-06-17 NOTE — Patient Instructions (Signed)
Follow post dilation diet today and resume previous medications. Back to your normal diet tomorrow 06/18/21. Awaiting pathology results.  ? ?YOU HAD AN ENDOSCOPIC PROCEDURE TODAY AT [facility]:   Refer to the procedure report that was given to you for any specific questions about what was found during the examination.  If the procedure report does not answer your questions, please call your gastroenterologist to clarify.  If you requested that your care partner not be given the details of your procedure findings, then the procedure report has been included in a sealed envelope for you to review at your convenience later. ? ?YOU SHOULD EXPECT: Some feelings of bloating in the abdomen. Passage of more gas than usual.  Walking can help get rid of the air that was put into your GI tract during the procedure and reduce the bloating. If you had a lower endoscopy (such as a colonoscopy or flexible sigmoidoscopy) you may notice spotting of blood in your stool or on the toilet paper. If you underwent a bowel prep for your procedure, you may not have a normal bowel movement for a few days. ? ?Please Note:  You might notice some irritation and congestion in your nose or some drainage.  This is from the oxygen used during your procedure.  There is no need for concern and it should clear up in a day or so. ? ?SYMPTOMS TO REPORT IMMEDIATELY: ? ?Following upper endoscopy (EGD) ? Vomiting of blood or coffee ground material ? New chest pain or pain under the shoulder blades ? Painful or persistently difficult swallowing ? New shortness of breath ? Fever of 100?F or higher ? Black, tarry-looking stools ? ?For urgent or emergent issues, a gastroenterologist can be reached at any hour by calling [phone]. ?Do not use MyChart messaging for urgent concerns.  ? ? ?DIET:  We do recommend a small meal at first, but then you may proceed to your regular diet.  Drink plenty of fluids but you should avoid alcoholic  beverages for 24 hours. ? ?ACTIVITY:  You should plan to take it easy for the rest of today and you should NOT DRIVE or use heavy machinery until tomorrow (because of the sedation medicines used during the test).   ? ?FOLLOW UP: ?Our staff will call the number listed on your records 48-72 hours following your procedure to check on you and address any questions or concerns that you may have regarding the information given to you following your procedure. If we do not reach you, we will leave a message.  We will attempt to reach you two times.  During this call, we will ask if you have developed any symptoms of COVID 19. If you develop any symptoms (ie: fever, flu-like symptoms, shortness of breath, cough etc.) before then, please call [phone].  If you test positive for Covid 19 in the 2 weeks post procedure, please call and report this information to [name].   ? ?If any biopsies were taken you will be contacted by phone or by letter within the next 1-3 weeks.  Please call us at [phone] if you have not heard about the biopsies in 3 weeks.  ? ? ?SIGNATURES/CONFIDENTIALITY: ?You and/or your care partner have signed paperwork which will be entered into your electronic medical record.  These signatures attest to the fact that that the information above on your After Visit Summary has been reviewed and is understood.  Full responsibility of the confidentiality of this discharge information  lies with you and/or your care-partner.  ?

## 2021-06-17 NOTE — Progress Notes (Signed)
Report to PACU, RN, vss, BBS= Clear.  

## 2021-06-17 NOTE — Op Note (Signed)
[facility] ?Patient Name: Cheryl [name] ?Procedure Date: 06/17/2021 11:06 AM ?MRN: [id number] ?Endoscopist: Docia Chuck. Henrene Pastor , MD ?Age: 81 ?Referring MD:  ?Date of Birth: 04/07/1940 ?Gender: Female ?Account #: [id number] ?Procedure:                Upper GI endoscopy with biopsies; [place] dilation  ?                          esophagus. [address] [place] ?Indications:              Dysphagia, Esophageal reflux ?Medicines:                Monitored Anesthesia Care ?Procedure:                Pre-Anesthesia Assessment: ?                          - Prior to the procedure, a History and Physical  ?                          was performed, and patient medications and  ?                          allergies were reviewed. The patient's tolerance of  ?                          previous anesthesia was also reviewed. The risks  ?                          and benefits of the procedure and the sedation  ?                          options and risks were discussed with the patient.  ?                          All questions were answered, and informed consent  ?                          was obtained. Prior Anticoagulants: The patient has  ?                          taken no previous anticoagulant or antiplatelet  ?                          agents. ASA Grade Assessment: II - A patient with  ?                          mild systemic disease. After reviewing the risks  ?                          and benefits, the patient was deemed in  ?                          satisfactory condition to undergo the procedure. ?  After obtaining informed consent, the endoscope was  ?                          passed under direct vision. Throughout the  ?                          procedure, the patient's blood pressure, pulse, and  ?                          oxygen saturations were monitored continuously. The  ?                          Endoscope was introduced through the mouth, and  ?                          advanced to the second part  of duodenum. The upper  ?                          GI endoscopy was accomplished without difficulty.  ?                          The patient tolerated the procedure well. ?Scope In: ?Scope Out: ?Findings:                 The esophagus revealed scattered whitish plaques  ?                          throughout the distal one half consistent with  ?                          esophageal candidiasis. Biopsies were taken. ?                          One benign-appearing, intrinsic moderate stenosis  ?                          was found 36 cm from the incisors. This stenosis  ?                          measured 1.5 cm (inner diameter). The scope was  ?                          withdrawn. Dilation was performed with a Venia Minks  ?                          dilator with no resistance at 87 Fr. ?                          The stomach benign fundic gland polyps and sliding  ?                          hiatal hernia. ?                          The examined duodenum was  normal. ?                          The cardia and gastric fundus were normal on  ?                          retroflexion. ?Complications:            No immediate complications. ?Estimated Blood Loss:     Estimated blood loss: none. ?Impression:               1. GERD with esophageal stricture status post  ?                          dilation ?                          2. Esophageal candidiasis status post biopsies ?                          3. Benign fundic gland polyps. Otherwise normal EGD. ?Recommendation:           1. Patient has a contact number available for  ?                          emergencies. The signs and symptoms of potential  ?                          delayed complications were discussed with the  ?                          patient. Return to normal activities tomorrow.  ?                          Written discharge instructions were provided to the  ?                          patient. ?                          2. Post dilation diet. ?                           3. Continue present medications. ?                          4. Await pathology results. ?                          5. Prescribe Mycelex troches; #30; 1 lozenge or by  ?                          mouth 5 times daily until finished ?                          6. Return to the care of your primary provider. GI  ?  follow-up as needed ?Docia Chuck. Henrene Pastor, MD ?06/17/2021 11:31:23 AM ?This report has been signed electronically. ?

## 2021-06-17 NOTE — Progress Notes (Signed)
Subjective:  ?  ?  ?Subjective  ? Patient ID: Cheryl [name], female    DOB: 05-Jul-1940, 81 y.o.   MRN: [id number] ?  ?HPI ?Cheryl [name] is a pleasant 81 year old white female, established with Dr. Henrene Pastor.  She was last seen here in July 2022 when she underwent colonoscopy for history of multiple adenomatous colon polyps.  She was found to have multiple AVMs in the cecum, nonbleeding multiple diverticuli throughout the colon and no polyps.  No repeat surveillance due to age ?She had very remote EGD in 2002 which was normal. ?She comes in today with complaints of dysphagia.  She does have history of chronic GERD and has been on omeprazole 20 mg p.o. daily over the past several months.  She says she had been on twice daily dosing at 1 point but was not sure that she needed it and has not felt any different with once daily dosing.  She does not have any heartburn or indigestion. ?She has been noticing intermittent episodes of solid food dysphagia over the past few months.  This typically occurs with bread or meat and any larger pieces of food.  She has modified her diet somewhat, and is being very careful with what she eats.  She is not having symptoms with pills or liquids.  Currently the episodes are occurring once a week or so.  She has to stop eating and drink fluids to get the food to pass, no episodes requiring regurgitation.  No nausea or vomiting, no complaints of abdominal pain. ?Other medical problems include migraine headaches, carotid disease, granulomatous lung disease, IBS, mild cognitive impairment and lumbar stenosis. ?She is not on any blood thinners.  No significant cardiopulmonary disease. ?  ?Review of Systems. Pertinent positive and negative review of systems were noted in the above HPI section.  All other review of systems was otherwise negative.  ?  ?    ?Outpatient Encounter Medications as of 05/12/2021  ?Medication Sig  ? aspirin 81 MG tablet Take 81 mg by mouth daily.  ? Cholecalciferol (VITAMIN D) 1000  UNITS capsule Take 2,000 Units by mouth daily.  ? cycloSPORINE (RESTASIS) 0.05 % ophthalmic emulsion Place 1 drop into both eyes 2 (two) times daily.  ? fluticasone (FLONASE) 50 MCG/ACT nasal spray INSTILL 2 SPRAYS INTO EACH NOSTRIL EVERY DAY  ? HYDROcodone bit-homatropine (HYCODAN) 5-1.5 MG/5ML syrup Take 5 mLs by mouth every 8 (eight) hours as needed for cough.  ? MAGNESIUM PO Take 1 tablet by mouth daily.   ? Omega-3 Fatty Acids (FISH OIL) 1000 MG CAPS Take 1 capsule (1,000 mg total) by mouth daily. (Patient taking differently: Take 2,000 mg by mouth daily.)  ? omeprazole (PRILOSEC) 20 MG capsule TAKE 1 CAPSULE TWICE DAILY BEFORE A MEAL  ? PARoxetine (PAXIL) 40 MG tablet TAKE 1 TABLET BY MOUTH EVERY DAY  ? polyethylene glycol (MIRALAX / GLYCOLAX) packet Take 17 g by mouth daily.  ? REPATHA SURECLICK 564 MG/ML SOAJ INJECT 140 MG INTO THE SKIN EVERY 14 (FOURTEEN) DAYS.  ? traMADol (ULTRAM) 50 MG tablet Take 2 tablets (100 mg total) by mouth daily as needed.  ? Ubrogepant (UBRELVY) 100 MG TABS Take 100 mg by mouth every 2 (two) hours as needed. Maximum '200mg'$  a day.  ? [DISCONTINUED] amoxicillin-clavulanate (AUGMENTIN) 875-125 MG tablet Take 1 tablet by mouth 2 (two) times daily.  ? [DISCONTINUED] PARoxetine (PAXIL) 20 MG tablet TAKE 1 TABLET BY MOUTH EVERY DAY IN THE MORNING  ?  ?No facility-administered encounter medications on  file as of 05/12/2021.  ?  ?     ?Allergies  ?Allergen Reactions  ? Celebrex [Celecoxib] Itching, Nausea Only and Other (See Comments)  ?    Gas, hair loss  ? Fluoxetine Hcl Shortness Of Breath and Itching  ?    REACTION: itching,SOB  ? Ambien [Zolpidem] Other (See Comments)  ?    Headaches   ? Amitriptyline    ?    Dry mouth ?   ? Contrast Media [Iodinated Contrast Media] Itching  ?    Multilance.   ? Dexamethasone Diarrhea and Nausea And Vomiting  ? Livalo [Pitavastatin] Other (See Comments)  ?    Myalgia   ? Metrizamide Itching  ?    Multilance.   ? Nitroglycerin    ?     Nausea,dizziness,sweats  ? Rosuvastatin Other (See Comments)  ?    bodyaches and cramping  ? Simvastatin Other (See Comments)  ?    Muscle aches and HA  ? Codeine Sulfate Itching  ?    REACTION: itching  ? Sulfasalazine Nausea Only  ?    REACTION: Nausea  ? Sulfonamide Derivatives Nausea Only  ?    REACTION: Nausea  ?  ?    ?Patient Active Problem List  ?  Diagnosis Date Noted  ? DDD (degenerative disc disease), cervical 03/01/2021  ? Stenosis of cervical spine with myelopathy ( [place]) 03/01/2021  ? Mild cognitive impairment 02/15/2021  ? Pharyngoesophageal dysphagia 12/14/2020  ? Osteopenia 10/23/2020  ? Abnormal glucose 08/13/2020  ? Chronic migraine without aura, with intractable migraine, so stated, with status migrainosus 07/03/2019  ? Carotid stenosis, bilateral 06/06/2019  ? Migraine headache 03/22/2019  ? Daily headache 03/22/2019  ? Myofascial pain 03/27/2018  ? Numbness of left foot 11/07/2017  ? Right foot pain 09/12/2017  ? Deviated septum 01/25/2017  ? ETD (Eustachian tube dysfunction), bilateral 01/25/2017  ? Seasonal allergic rhinitis 01/25/2017  ? Bilateral hearing loss 01/25/2017  ? S/P lumbar fusion 06/20/2016  ? 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  ? Fatty liver 12/22/2015  ? Onychodystrophy 11/24/2015  ? Hyperlipidemia 12/17/2013  ? Osteoarthritis of left knee 07/05/2013  ? Obesity 03/10/2011  ? Dyspepsia 02/10/2011  ? 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 PALPITATIONS 02/11/2010  ? OSTEOPENIA 11/27/2009  ? CONSTIPATION, CHRONIC 08/15/2008  ? Insomnia 07/08/2008  ? Chronic neck pain 04/11/2008  ? Disorder of kidney and ureter 01/21/2008  ? HEMORRHOIDS, INTERNAL 01/16/2008  ? Diverticulosis of colon 01/16/2008  ? POSTMENOPAUSAL STATUS 12/05/2007  ? MICROSCOPIC HEMATURIA 06/05/2007  ? STENOSIS, LUMBAR SPINE 12/22/2005  ? Anxiety and depression 11/15/2005  ? IRRITABLE BOWEL SYNDROME 11/15/2005  ? ARTHRITIS 11/15/2005  ?  ?Social History  ?  ?     ?Socioeconomic History  ?  Marital status: Divorced  ?    Spouse name: Not on file  ? Number of children: 5  ? Years of education: Not on file  ? Highest education level: Not on file  ?Occupational History  ? Occupation: Retired    ?    Employer: RETIRED  ?Tobacco Use  ? Smoking status: Former  ?    Types: Cigarettes  ?    Quit date: 02/07/1993  ?    Years since quitting: 28.2  ? Smokeless tobacco: Never  ?Vaping Use  ? Vaping Use: Never used  ?Substance and Sexual Activity  ? Alcohol use: No  ? Drug use: No  ? Sexual activity: Not on file  ?Other Topics Concern  ?  Not on file  ?Social History Narrative  ?  Currently doing PT once a week. Caffeine- ginger ale occ.   ?     ?     ?     ?     ?     ?     ?     ?  ?Social Determinants of Health  ?  ?Financial Resource Strain: Not on file  ?Food Insecurity: Not on file  ?Transportation Needs: Not on file  ?Physical Activity: Not on file  ?Stress: Not on file  ?Social Connections: Not on file  ?Intimate Partner Violence: Not on file  ?  ?  ?Cheryl [name] family history includes Alcohol abuse in her brother and father; Crohn's disease in her brother; Depression in her brother; HIV in her son; Heart disease in her father and another family member; Heart failure in her daughter; Kidney disease in her mother; Kidney failure in her mother; Lung cancer in her father and son; Rheum arthritis in her mother. ?  ?  ?  ?  ?Objective:  ?  ?Objective  ?  ?    ?Vitals:  ?  05/12/21 1506 05/12/21 1548  ?BP: 132/76 124/70  ?Pulse: 77    ?  ?  ?Physical Exam Well-developed well-nourished really white female in no acute distress.  Pleasant  Weight, 179 BMI 29.8 ?  ?HEENT; nontraumatic normocephalic, EOMI, PE R LA, sclera anicteric. ?Oropharynx; not examined today ?Neck; supple, no JVD ?Cardiovascular; regular rate and rhythm with S1-S2, no murmur rub or gallop ?Pulmonary; Clear bilaterally ?Abdomen; soft, nontender, nondistended, no palpable mass or hepatosplenomegaly, bowel sounds are active ?Rectal; not done  today ?Skin; benign exam, no jaundice rash or appreciable lesions ?Extremities; no clubbing cyanosis or edema skin warm and dry ?Neuro/Psych; alert and oriented x4, grossly nonfocal mood and affect appropriate  ?  ?  ?  ?  ?  ?As

## 2021-06-18 ENCOUNTER — Encounter: Payer: Self-pay | Admitting: Family Medicine

## 2021-06-18 ENCOUNTER — Other Ambulatory Visit: Payer: Self-pay | Admitting: Internal Medicine

## 2021-06-18 DIAGNOSIS — K222 Esophageal obstruction: Secondary | ICD-10-CM

## 2021-06-18 DIAGNOSIS — L299 Pruritus, unspecified: Secondary | ICD-10-CM

## 2021-06-18 DIAGNOSIS — R1319 Other dysphagia: Secondary | ICD-10-CM

## 2021-06-18 LAB — URINE CULTURE
MICRO NUMBER:: 13367964
SPECIMEN QUALITY:: ADEQUATE

## 2021-06-18 MED ORDER — HYDROXYZINE PAMOATE 25 MG PO CAPS: 25.0000 mg | ORAL_CAPSULE | Freq: Three times a day (TID) | ORAL | 0 refills | Status: DC | PRN

## 2021-06-18 NOTE — Telephone Encounter (Signed)
Meds ordered this encounter  ?Medications  ? hydrOXYzine (VISTARIL) 25 MG capsule  ?  Sig: Take 1 capsule (25 mg total) by mouth 3 (three) times daily as needed for itching.  ?  Dispense:  45 capsule  ?  Refill:  0  ? ? ?

## 2021-06-18 NOTE — Progress Notes (Signed)
Hi and, the urine culture shows E. coli.  I am still waiting for the sensitivity report to come back so I can make sure to put you on the correct antibiotic.  I was hoping it would be back this morning, I will check again after lunch.

## 2021-06-18 NOTE — Progress Notes (Signed)
Hi Cheryl [name], the sensitivities finally came back.  The antibiotic that I put you on is the correct one so hopefully you are feeling a lot better.  If not, then please give [name] a call back on Monday.

## 2021-06-21 ENCOUNTER — Telehealth: Payer: Self-pay | Admitting: *Deleted

## 2021-06-21 NOTE — Telephone Encounter (Signed)
?  Follow up Call- ? ? ?  06/17/2021  ? 10:09 AM 09/04/2020  ?  1:46 PM  ?Call back number  ?Post procedure Call Back phone  # [phone]  ?Permission to leave phone message Yes Yes  ?  ? ?Patient questions: ? ?Do you have a fever, pain , or abdominal swelling? No. ?Pain Score  0 * ? ?Have you tolerated food without any problems? Yes.   ? ?Have you been able to return to your normal activities? Yes.   ? ?Do you have any questions about your discharge instructions: ?Diet   No. ?Medications  No. ?Follow up visit  No. ? ?Do you have questions or concerns about your Care? No. ? ?Actions: ?* If pain score is 4 or above: ?No action needed, pain <4. ? ? ?

## 2021-06-22 ENCOUNTER — Encounter: Payer: Self-pay | Admitting: Internal Medicine

## 2021-06-23 ENCOUNTER — Ambulatory Visit (INDEPENDENT_AMBULATORY_CARE_PROVIDER_SITE_OTHER): Payer: PPO | Admitting: Physician Assistant

## 2021-06-23 ENCOUNTER — Telehealth: Payer: Self-pay

## 2021-06-23 ENCOUNTER — Other Ambulatory Visit: Payer: Self-pay | Admitting: *Deleted

## 2021-06-23 DIAGNOSIS — R3915 Urgency of urination: Secondary | ICD-10-CM

## 2021-06-23 DIAGNOSIS — R300 Dysuria: Secondary | ICD-10-CM

## 2021-06-23 DIAGNOSIS — R8290 Unspecified abnormal findings in urine: Secondary | ICD-10-CM

## 2021-06-23 DIAGNOSIS — N3001 Acute cystitis with hematuria: Secondary | ICD-10-CM | POA: Diagnosis not present

## 2021-06-23 LAB — POCT URINALYSIS DIPSTICK
Glucose, UA: POSITIVE — AB
Nitrite, UA: POSITIVE
Protein, UA: POSITIVE — AB
Spec Grav, UA: 1.01 (ref 1.010–1.025)
Urobilinogen, UA: 2 E.U./dL — AB
pH, UA: 7 (ref 5.0–8.0)

## 2021-06-23 MED ORDER — CIPROFLOXACIN HCL 500 MG PO TABS: 500.0000 mg | ORAL_TABLET | Freq: Two times a day (BID) | ORAL | 0 refills | Status: AC

## 2021-06-23 NOTE — Progress Notes (Signed)
LVM for pt to call to discuss results.  T. Joby Hershkowitz, CMA  

## 2021-06-23 NOTE — Progress Notes (Signed)
Pt finished macrobid that showed sensitivity to E.coli on last culture 5/8. Symptoms did clear and after 5 days stopped macrobid. Pt started having dysuria and rechecked urine today.  ? ?.. ?Results for orders placed or performed in visit on 06/23/21  ?POCT urinalysis dipstick  ?Result Value Ref Range  ? Color, UA orange   ? Clarity, UA clear   ? Glucose, UA Positive (A) Negative  ? Bilirubin, UA small   ? Ketones, UA trace   ? Spec Grav, UA 1.010 1.010 - 1.025  ? Blood, UA small   ? pH, UA 7.0 5.0 - 8.0  ? Protein, UA Positive (A) Negative  ? Urobilinogen, UA 2.0 (A) 0.2 or 1.0 E.U./dL  ? Nitrite, UA positive   ? Leukocytes, UA Large (3+) (A) Negative  ? Appearance    ? Odor    ? ?Appears like UTI has not cleared.  ?Will culture.  ?Allergy to sulfa drugs.  ?Sent cipro for 10 days.  ?Follow up with PCP for clearance in 10- 14 days.  ? ? ?

## 2021-06-23 NOTE — Progress Notes (Signed)
Pt here for UA nurse visit per [facility].  Pt was diagnosed with UTI on 06/14/2021 and is still experiencing dysuria and urinary urgency.  UA completed.  Urine culture sent to lab.  Charyl Bigger, CMA ?

## 2021-06-23 NOTE — Telephone Encounter (Signed)
Pt called c/o dysuria and urinary urgency.  Pt was seen 06/14/2021 by Dr. Madilyn Fireman and diagnosed with UTI.  Pt denies fever, chills, N/V, pelvic pain, and urinary frequency.  Per [place], pt to come in for nurse visit for UA.  Charyl Bigger, CMA ?

## 2021-06-23 NOTE — Telephone Encounter (Signed)
Pt was seen today.

## 2021-06-23 NOTE — Progress Notes (Signed)
See note but cipro sent to pharmacy with follow up with Dr. Madilyn Fireman in 14 days.

## 2021-06-24 ENCOUNTER — Ambulatory Visit: Payer: PPO | Admitting: Podiatry

## 2021-06-26 LAB — CULTURE, URINE COMPREHENSIVE
MICRO NUMBER:: 13414561
SPECIMEN QUALITY:: ADEQUATE

## 2021-06-28 NOTE — Progress Notes (Signed)
E.coli was found in urine but at very low quantities. Cipro should treat.

## 2021-06-29 ENCOUNTER — Other Ambulatory Visit: Payer: Self-pay | Admitting: Family Medicine

## 2021-06-29 DIAGNOSIS — L299 Pruritus, unspecified: Secondary | ICD-10-CM

## 2021-07-01 ENCOUNTER — Ambulatory Visit: Payer: PPO | Admitting: Podiatry

## 2021-07-01 DIAGNOSIS — L600 Ingrowing nail: Secondary | ICD-10-CM

## 2021-07-01 NOTE — Patient Instructions (Signed)

## 2021-07-04 ENCOUNTER — Encounter: Payer: Self-pay | Admitting: Family Medicine

## 2021-07-04 NOTE — Progress Notes (Signed)
Subjective:  Patient ID: Cheryl [name], female    DOB: [birth year],  MRN: [id number] HPI Chief Complaint  Patient presents with   Ingrown Toenail    Ingrown toenail of left foot     81 y.o. female presents with the above complaint.   ROS: Denies fever chills nausea vomiting muscle aches pains calf pain back pain chest pain shortness of breath.  Past Medical History:  Diagnosis Date   Anemia    Anxiety    d/t recent family issues   Arthritis    Bone spur    Carotid stenosis, bilateral 06/06/2019   Chronic back pain    stenosis and OA   Depression    takes Paxil daily   Diverticulosis    Dizzy    occasionally   Encephalitis    at 9 months ago    Fatty liver    has had 2 Hep shots and the final one is in June 18   GERD (gastroesophageal reflux disease)    takes Omeprazole daily   Hepatitis 2012   History of bronchitis    20-25 yrs ago   History of colon polyps    precancerous   History of shingles    Hyperlipidemia    takes Simvastatin daily   IBS (irritable bowel syndrome)    more on side of constipation-takes Miralax daily   Insomnia    takes Melatonin and Ambien nightly   Internal hemorrhoids    Joint pain    Joint swelling    Lung nodules    calcified    Migraine headache    migraines-thinks coming from neck.Last one1/28/18   Pneumonia    hx of-20 yrs ago-=-walking   Renal cyst    left   Uterine cancer ([place]) 1973   Past Surgical History:  Procedure Laterality Date   APPENDECTOMY  1966   cataracts     bilateral   COLONOSCOPY  09/04/2020   EUS  02/10/2011   Procedure: UPPER ENDOSCOPIC ULTRASOUND (EUS) RADIAL;  Surgeon: Owens Loffler, MD;  Location: [facility];  Service: Endoscopy;  Laterality: N/A;  radial linear    LUMBAR FUSION  03/2016   L4-5, Dr. Ola Spurr.   RECTOCELE REPAIR  2011   ROTATOR CUFF REPAIR  2009   left   TONSILLECTOMY     UPPER GASTROINTESTINAL ENDOSCOPY     VAGINAL HYSTERECTOMY  1973    Current Outpatient Medications:     aspirin 81 MG tablet, Take 81 mg by mouth daily., Disp: , Rfl:    Cholecalciferol (VITAMIN D) 1000 UNITS capsule, Take 2,000 Units by mouth daily., Disp: , Rfl:    cycloSPORINE (RESTASIS) 0.05 % ophthalmic emulsion, Place 1 drop into both eyes 2 (two) times daily., Disp: 5.5 mL, Rfl: 5   fluticasone (FLONASE) 50 MCG/ACT nasal spray, INSTILL 2 SPRAYS INTO EACH NOSTRIL EVERY DAY, Disp: 48 mL, Rfl: 3   hydrOXYzine (VISTARIL) 25 MG capsule, TAKE 1 CAPSULE BY MOUTH 3 TIMES DAILY AS NEEDED FOR ITCHING., Disp: 270 capsule, Rfl: 0   MAGNESIUM PO, Take 1 tablet by mouth daily. , Disp: , Rfl:    nitrofurantoin, macrocrystal-monohydrate, (MACROBID) 100 MG capsule, Take 1 capsule (100 mg total) by mouth 2 (two) times daily., Disp: 10 capsule, Rfl: 0   Omega-3 Fatty Acids (FISH OIL) 1000 MG CAPS, Take 1 capsule (1,000 mg total) by mouth daily. (Patient taking differently: Take 2,000 mg by mouth daily.), Disp: 90 capsule, Rfl: 3   omeprazole (PRILOSEC) 20 MG capsule, TAKE 1  CAPSULE TWICE DAILY BEFORE A MEAL, Disp: 180 capsule, Rfl: 3   PARoxetine (PAXIL) 40 MG tablet, TAKE 1 TABLET BY MOUTH EVERY DAY, Disp: 90 tablet, Rfl: 1   polyethylene glycol (MIRALAX / GLYCOLAX) packet, Take 17 g by mouth daily., Disp: , Rfl:    REPATHA SURECLICK 979 MG/ML SOAJ, INJECT 140 MG INTO THE SKIN EVERY 14 (FOURTEEN) DAYS., Disp: 6 mL, Rfl: 3   traMADol (ULTRAM) 50 MG tablet, Take 2 tablets (100 mg total) by mouth daily as needed., Disp: 180 tablet, Rfl: 1   Ubrogepant (UBRELVY) 100 MG TABS, Take 100 mg by mouth every 2 (two) hours as needed. Maximum '200mg'$  a day., Disp: 16 tablet, Rfl: 11  Allergies  Allergen Reactions   Celebrex [Celecoxib] Itching, Nausea Only and Other (See Comments)    Gas, hair loss   Fluoxetine Hcl Shortness Of Breath and Itching    REACTION: itching,SOB   Ambien [Zolpidem] Other (See Comments)    Headaches    Amitriptyline     Dry mouth    Contrast Media [Iodinated Contrast Media] Itching     Multilance.    Dexamethasone Diarrhea and Nausea And Vomiting   Livalo [Pitavastatin] Other (See Comments)    Myalgia    Metrizamide Itching    Multilance.    Nitroglycerin     Nausea,dizziness,sweats   Rosuvastatin Other (See Comments)    bodyaches and cramping   Simvastatin Other (See Comments)    Muscle aches and HA   Codeine Sulfate Itching    REACTION: itching   Sulfasalazine Nausea Only    REACTION: Nausea   Sulfonamide Derivatives Nausea Only    REACTION: Nausea   Review of Systems Objective:  There were no vitals filed for this visit.  General: Well developed, nourished, in no acute distress, alert and oriented x3   Dermatological: Skin is warm, dry and supple bilateral. Nails x 10 are well maintained; however she does demonstrate a sharp incurvated nail margin tibial border of the left hallux with mild purulence no malodor remaining integument appears unremarkable at this time. There are no open sores, no preulcerative lesions, no rash or signs of infection present.  Vascular: Dorsalis Pedis artery and Posterior Tibial artery pedal pulses are 2/4 bilateral with immedate capillary fill time. Pedal hair growth present. No varicosities and no lower extremity edema present bilateral.   Neruologic: Grossly intact via light touch bilateral. Vibratory intact via tuning fork bilateral. Protective threshold with Semmes Wienstein monofilament intact to all pedal sites bilateral. Patellar and Achilles deep tendon reflexes 2+ bilateral. No Babinski or clonus noted bilateral.   Musculoskeletal: No gross boney pedal deformities bilateral. No pain, crepitus, or limitation noted with foot and ankle range of motion bilateral. Muscular strength 5/5 in all groups tested bilateral.  Gait: Unassisted, Nonantalgic.    Radiographs:  None taken  Assessment & Plan:   Assessment: Ingrown toenail tibial border hallux left  Plan: Chemical matrixectomy was performed today after local  anesthetic was administered patient tolerated the procedure well.  She was given both oral and written home-going instructions for care and soaking of the foot.  She received a prescription for Cortisporin Otic to be applied twice daily after soaking.  I will follow-up with her in a couple weeks to make sure she is doing well.  Questions or concerns she will notify us immediately.     Cheryl [name], [place]

## 2021-07-12 ENCOUNTER — Telehealth: Payer: Self-pay | Admitting: *Deleted

## 2021-07-12 NOTE — Telephone Encounter (Signed)
[place] Key: [id number] - PA Case ID: [id number] - Rx #: [id number]  PA Ubrevly complete waiting on approval

## 2021-07-12 NOTE — Telephone Encounter (Signed)
Approved today 05-JUN-23:05-JUN-24 Ubrelvy '100MG'$  OR TABS Quantity:16;

## 2021-07-13 ENCOUNTER — Telehealth: Payer: Self-pay | Admitting: *Deleted

## 2021-07-13 DIAGNOSIS — G3184 Mild cognitive impairment, so stated: Secondary | ICD-10-CM

## 2021-07-13 NOTE — Telephone Encounter (Signed)
Pt's daughter called and lvm wanting to discuss her mother's cognitive decline. She stated that she feels that it is actually getting worse and she is very concerned.   She would like to get a referral for her mother to be further evaluated. She understood that her mother was tested in the office but feels that she will need additional testing.   Will fwd to pcp for advice. *referral for [facility] pended  Spoke w/Kim she stated that her mother is aware that [name] would like her to have the additional testing done and is ok with moving forward with this.   Referral signed.

## 2021-07-14 ENCOUNTER — Other Ambulatory Visit: Payer: Self-pay | Admitting: Family Medicine

## 2021-07-15 ENCOUNTER — Encounter: Payer: Self-pay | Admitting: Podiatry

## 2021-07-15 ENCOUNTER — Ambulatory Visit: Payer: PPO | Admitting: Podiatry

## 2021-07-15 DIAGNOSIS — L600 Ingrowing nail: Secondary | ICD-10-CM

## 2021-07-15 DIAGNOSIS — Z9889 Other specified postprocedural states: Secondary | ICD-10-CM

## 2021-07-15 NOTE — Telephone Encounter (Signed)
Spoke with Cheryl [name] her daughter and listened to her concerns she does feel like her memory is actually getting worse.  She feels like in part it is because she is primarily homebound and then when she does get the opportunity to go out she is nervous about going out.  We discussed some further options.  She was noted to have some decline on her last memory test in January.  We will go ahead and place referral for her memory clinic at [facility].  Depending on the wait to get her in we may schedule her to repeat the MoCA and even consider a trial of medication this summer if needed.  Last brain MRI was in January 2022.  At that time it showed mild frontal lobe atrophy and moderate to advanced chronic microvascular ischemic change throughout the white matter.

## 2021-07-15 NOTE — Progress Notes (Signed)
She presents today for follow-up of her nail procedure hallux left.  States that she has been doing just great no problems whatsoever continues to soak Epsom salts and warm water.  Objective: Vital signs are stable she alert and oriented x3 there is no erythema edema cellulitis drainage or odor margin appears to be healing very nicely see no signs of infection.  Assessment: Well-healing surgical toe.  Plan: I encouraged her to soak every other day for about another week or so or until some of the redness has gone away.  But otherwise cover during the day and leave open at bedtime.  Follow-up with me if anything comes painful or demonstrates any signs of infection.

## 2021-07-25 ENCOUNTER — Emergency Department (HOSPITAL_COMMUNITY): Payer: PPO

## 2021-07-25 ENCOUNTER — Emergency Department (HOSPITAL_COMMUNITY)
Admission: EM | Admit: 2021-07-25 | Discharge: 2021-07-25 | Disposition: A | Discharge status: Home / Self Care | Payer: PPO | Attending: Emergency Medicine | Admitting: Emergency Medicine

## 2021-07-25 ENCOUNTER — Encounter (HOSPITAL_COMMUNITY): Payer: Self-pay

## 2021-07-25 DIAGNOSIS — R112 Nausea with vomiting, unspecified: Secondary | ICD-10-CM | POA: Diagnosis not present

## 2021-07-25 DIAGNOSIS — R42 Dizziness and giddiness: Secondary | ICD-10-CM | POA: Diagnosis not present

## 2021-07-25 DIAGNOSIS — R29818 Other symptoms and signs involving the nervous system: Secondary | ICD-10-CM | POA: Diagnosis not present

## 2021-07-25 DIAGNOSIS — R110 Nausea: Secondary | ICD-10-CM | POA: Diagnosis not present

## 2021-07-25 DIAGNOSIS — R9082 White matter disease, unspecified: Secondary | ICD-10-CM | POA: Insufficient documentation

## 2021-07-25 DIAGNOSIS — G43909 Migraine, unspecified, not intractable, without status migrainosus: Secondary | ICD-10-CM | POA: Diagnosis not present

## 2021-07-25 DIAGNOSIS — R1111 Vomiting without nausea: Secondary | ICD-10-CM | POA: Diagnosis not present

## 2021-07-25 DIAGNOSIS — E860 Dehydration: Secondary | ICD-10-CM | POA: Insufficient documentation

## 2021-07-25 DIAGNOSIS — I6522 Occlusion and stenosis of left carotid artery: Secondary | ICD-10-CM | POA: Insufficient documentation

## 2021-07-25 DIAGNOSIS — R519 Headache, unspecified: Secondary | ICD-10-CM | POA: Insufficient documentation

## 2021-07-25 DIAGNOSIS — Z7982 Long term (current) use of aspirin: Secondary | ICD-10-CM | POA: Insufficient documentation

## 2021-07-25 LAB — URINALYSIS, ROUTINE W REFLEX MICROSCOPIC
Bilirubin Urine: NEGATIVE
Glucose, UA: NEGATIVE mg/dL
Hgb urine dipstick: NEGATIVE
Ketones, ur: 5 mg/dL — AB
Leukocytes,Ua: NEGATIVE
Nitrite: NEGATIVE
Protein, ur: NEGATIVE mg/dL
Specific Gravity, Urine: 1.012 (ref 1.005–1.030)
pH: 8 (ref 5.0–8.0)

## 2021-07-25 LAB — COMPREHENSIVE METABOLIC PANEL
ALT: 37 U/L (ref 0–44)
AST: 35 U/L (ref 15–41)
Albumin: 4 g/dL (ref 3.5–5.0)
Alkaline Phosphatase: 78 U/L (ref 38–126)
Anion gap: 9 (ref 5–15)
BUN: 15 mg/dL (ref 8–23)
CO2: 24 mmol/L (ref 22–32)
Calcium: 9.6 mg/dL (ref 8.9–10.3)
Chloride: 107 mmol/L (ref 98–111)
Creatinine, Ser: 0.85 mg/dL (ref 0.44–1.00)
GFR, Estimated: >60 mL/min (ref >60)
Glucose, Bld: 142 mg/dL — ABNORMAL HIGH (ref 70–99)
Potassium: 3.6 mmol/L (ref 3.5–5.1)
Sodium: 140 mmol/L (ref 135–145)
Total Bilirubin: 0.6 mg/dL (ref 0.3–1.2)
Total Protein: 7.3 g/dL (ref 6.5–8.1)

## 2021-07-25 LAB — CBC WITH DIFFERENTIAL/PLATELET
Abs Immature Granulocytes: 0.02 10*3/uL (ref 0.00–0.07)
Basophils Absolute: 0 10*3/uL (ref 0.0–0.1)
Basophils Relative: 0 %
Eosinophils Absolute: 0.1 10*3/uL (ref 0.0–0.5)
Eosinophils Relative: 1 %
HCT: 44.3 % (ref 36.0–46.0)
Hemoglobin: 14.7 g/dL (ref 12.0–15.0)
Immature Granulocytes: 0 %
Lymphocytes Relative: 20 %
Lymphs Abs: 1.6 10*3/uL (ref 0.7–4.0)
MCH: 29 pg (ref 26.0–34.0)
MCHC: 33.2 g/dL (ref 30.0–36.0)
MCV: 87.4 fL (ref 80.0–100.0)
Monocytes Absolute: 0.4 10*3/uL (ref 0.1–1.0)
Monocytes Relative: 5 %
Neutro Abs: 5.7 10*3/uL (ref 1.7–7.7)
Neutrophils Relative %: 74 %
Platelets: 255 10*3/uL (ref 150–400)
RBC: 5.07 MIL/uL (ref 3.87–5.11)
RDW: 12.9 % (ref 11.5–15.5)
WBC: 7.8 10*3/uL (ref 4.0–10.5)
nRBC: 0 % (ref 0.0–0.2)

## 2021-07-25 LAB — LIPASE, BLOOD: Lipase: 27 U/L (ref 11–51)

## 2021-07-25 MED ORDER — GADOBUTROL 1 MMOL/ML IV SOLN
8.0000 mL | Freq: Once | INTRAVENOUS | Status: AC | PRN
  Administered 2021-07-25: 8 mL via INTRAVENOUS

## 2021-07-25 MED ORDER — SODIUM CHLORIDE 0.9 % IV BOLUS
1000.0000 mL | Freq: Once | INTRAVENOUS | Status: AC
  Administered 2021-07-25: 1000 mL via INTRAVENOUS

## 2021-07-25 MED ORDER — MECLIZINE HCL 25 MG PO TABS: 25.0000 mg | ORAL_TABLET | Freq: Three times a day (TID) | ORAL | 0 refills | Status: DC | PRN

## 2021-07-25 MED ORDER — ONDANSETRON 4 MG PO TBDP
4.0000 mg | ORAL_TABLET | Freq: Once | ORAL | Status: AC
  Administered 2021-07-25: 4 mg via ORAL
  Filled 2021-07-25: qty 1

## 2021-07-25 NOTE — Discharge Instructions (Addendum)
Your history, exam, and work-up today did not show evidence of acute stroke or critical vessel abnormality causing your dizziness.  I suspect this is more related to dehydration and vertigo.  Please follow-up with your primary doctor this week and use the dizzy medicine to help.  Please make sure you are staying hydrated.  If any symptoms change or worsen acutely, please return to the nearest emergency department.

## 2021-07-25 NOTE — ED Provider Notes (Signed)
[facility] DEPT Provider Note   CSN: [id number] Arrival date & time: 07/25/21  7681     History  Chief Complaint  Patient presents with   Nausea   Dizziness    Cheryl [name] is a 81 y.o. female.  The history is provided by the patient and medical records. No language interpreter was used.  Dizziness Quality:  Room spinning Severity:  Severe Onset quality:  Sudden Duration:  1 day Timing:  Intermittent Progression:  Waxing and waning Chronicity:  Recurrent (for last 2 months nearly weekly) Relieved by:  Nothing Worsened by:  Nothing Ineffective treatments:  None tried Associated symptoms: headaches, nausea and vomiting   Associated symptoms: no chest pain, no diarrhea, no palpitations, no shortness of breath, no syncope, no vision changes and no weakness        Home Medications Prior to Admission medications   Medication Sig Start Date End Date Taking? Authorizing Provider  aspirin 81 MG tablet Take 81 mg by mouth daily.    [provider]  Cholecalciferol (VITAMIN D) 1000 UNITS capsule Take 2,000 Units by mouth daily.    [provider]  cycloSPORINE (RESTASIS) 0.05 % ophthalmic emulsion Place 1 drop into both eyes 2 (two) times daily. 04/09/21   Hali Marry, MD  fluticasone (FLONASE) 50 MCG/ACT nasal spray INSTILL 2 SPRAYS INTO EACH NOSTRIL EVERY DAY 03/24/21   Hali Marry, MD  hydrOXYzine (VISTARIL) 25 MG capsule TAKE 1 CAPSULE BY MOUTH 3 TIMES DAILY AS NEEDED FOR ITCHING. 06/30/21   Hali Marry, MD  MAGNESIUM PO Take 1 tablet by mouth daily.     [provider]  Omega-3 Fatty Acids (FISH OIL) 1000 MG CAPS Take 1 capsule (1,000 mg total) by mouth daily. Patient taking differently: Take 2,000 mg by mouth daily. 10/14/20   [name], MD  omeprazole (PRILOSEC) 20 MG capsule TAKE 1 CAPSULE TWICE DAILY BEFORE A MEAL 03/22/21   Hali Marry, MD  PARoxetine (PAXIL) 40 MG tablet  TAKE 1 TABLET BY MOUTH EVERY DAY 05/10/21   Hali Marry, MD  polyethylene glycol (MIRALAX / GLYCOLAX) packet Take 17 g by mouth daily.    [provider]  REPATHA SURECLICK 157 MG/ML SOAJ INJECT 140 MG INTO THE SKIN EVERY 14 (FOURTEEN) DAYS. 05/10/21   [name], MD  traMADol (ULTRAM) 50 MG tablet Take 2 tablets (100 mg total) by mouth daily as needed. 03/26/20   Silverio Decamp, MD  Ubrogepant (UBRELVY) 100 MG TABS Take 100 mg by mouth every 2 (two) hours as needed. Maximum '200mg'$  a day. 03/01/21   Melvenia Beam, MD      Allergies    Celebrex [celecoxib], Fluoxetine hcl, Ambien [zolpidem], Amitriptyline, Contrast media [iodinated contrast media], Dexamethasone, Livalo [pitavastatin], Metrizamide, Nitroglycerin, Rosuvastatin, Simvastatin, Codeine sulfate, Sulfasalazine, and Sulfonamide derivatives    Review of Systems   Review of Systems  Constitutional:  Negative for chills, diaphoresis, fatigue and fever.  HENT:  Negative for congestion.   Eyes:  Negative for visual disturbance.  Respiratory:  Negative for cough, chest tightness, shortness of breath and wheezing.   Cardiovascular:  Negative for chest pain, palpitations, leg swelling and syncope.  Gastrointestinal:  Positive for nausea and vomiting. Negative for abdominal pain, constipation and diarrhea.  Genitourinary:  Negative for dysuria and flank pain.  Musculoskeletal:  Positive for neck pain (chronic). Negative for back pain and neck stiffness.  Skin:  Negative for rash and wound.  Neurological:  Positive  for dizziness and headaches. Negative for syncope, facial asymmetry, speech difficulty, weakness, light-headedness and numbness.  Psychiatric/Behavioral:  Negative for agitation and confusion.   All other systems reviewed and are negative.   Physical Exam Updated Vital Signs BP 115/64   Pulse 73   Temp 98.8 F (37.1 C) (Oral)   Resp 18   SpO2 96%  Physical Exam Vitals and nursing note  reviewed.  Constitutional:      General: She is not in acute distress.    Appearance: She is well-developed. She is not ill-appearing, toxic-appearing or diaphoretic.  HENT:     Head: Normocephalic and atraumatic.     Nose: Nose normal. No congestion or rhinorrhea.     Mouth/Throat:     Mouth: Mucous membranes are dry.     Pharynx: No oropharyngeal exudate or posterior oropharyngeal erythema.  Eyes:     Extraocular Movements: Extraocular movements intact.     Conjunctiva/sclera: Conjunctivae normal.     Pupils: Pupils are equal, round, and reactive to light.  Neck:     Vascular: No carotid bruit.  Cardiovascular:     Rate and Rhythm: Normal rate and regular rhythm.     Heart sounds: No murmur heard. Pulmonary:     Effort: Pulmonary effort is normal. No respiratory distress.     Breath sounds: Normal breath sounds. No wheezing, rhonchi or rales.  Chest:     Chest wall: No tenderness.  Abdominal:     Palpations: Abdomen is soft.     Tenderness: There is no abdominal tenderness. There is no right CVA tenderness, left CVA tenderness, guarding or rebound.  Musculoskeletal:        General: No swelling or tenderness.     Cervical back: Neck supple. No tenderness.     Right lower leg: No edema.     Left lower leg: No edema.  Skin:    General: Skin is warm and dry.     Capillary Refill: Capillary refill takes less than 2 seconds.     Findings: No erythema or rash.  Neurological:     General: No focal deficit present.     Mental Status: She is alert.     Sensory: No sensory deficit.     Motor: No weakness.  Psychiatric:        Mood and Affect: Mood normal.     ED Results / Procedures / Treatments   Labs (all labs ordered are listed, but only abnormal results are displayed) Labs Reviewed  COMPREHENSIVE METABOLIC PANEL - Abnormal; Notable for the following components:      Result Value   Glucose, Bld 142 (*)    All other components within normal limits  URINALYSIS, ROUTINE W  REFLEX MICROSCOPIC - Abnormal; Notable for the following components:   APPearance HAZY (*)    Ketones, ur 5 (*)    All other components within normal limits  CBC WITH DIFFERENTIAL/PLATELET  LIPASE, BLOOD    EKG None  Radiology MR Angiogram Neck W or Wo Contrast  Result Date: 07/25/2021 CLINICAL DATA:  Vertigo, central. Transient dizziness and nausea intermittently when turning head to the left. EXAM: MRA NECK WITHOUT AND WITH CONTRAST TECHNIQUE: Multiplanar and multiecho pulse sequences of the neck were obtained without and with intravenous contrast. Angiographic images of the neck were obtained using MRA technique without and with intravenous contrast. MIP images are reviewed. CONTRAST:  49m GADAVIST GADOBUTROL 1 MMOL/ML IV SOLN COMPARISON:  None Available. FINDINGS: Time-of-flight images demonstrate antegrade flow in the  vertebral arteries bilaterally. Postcontrast images demonstrate common origin the left common carotid artery and innominate artery. No significant stenosis is present at the origins. Mild atherosclerotic irregularity is present along the medial aspect of the proximal right ICA without a significant stenosis. Cervical right ICA is within normal limits the ICA terminus. The right vertebral artery originates from the subclavian. No significant stenosis is present through the vertebrobasilar junction. The left common carotid artery is within normal limits. 50% stenosis is present proximal left ICA relative to the more distal vessel. Cervical left ICA is otherwise within normal limits through the ICA terminus. The left vertebral artery originates from the subclavian artery. No significant stenosis is present. IMPRESSION: 1. 50% stenosis of the proximal left ICA. 2. Mild atherosclerotic irregularity along the medial aspect of the proximal right ICA without a significant stenosis. 3. Normal appearance of the vertebral arteries bilaterally. No acute or focal abnormality to explain the  patient's symptoms when turning her head. Electronically Signed   By: San Morelle M.D.   On: 07/25/2021 18:13   MR BRAIN WO CONTRAST  Result Date: 07/25/2021 CLINICAL DATA:  Neuro deficit, acute, stroke suspected. Transient dizziness and nausea when turning neck to the left. EXAM: MRI HEAD WITHOUT CONTRAST TECHNIQUE: Multiplanar, multiecho pulse sequences of the brain and surrounding structures were obtained without intravenous contrast. COMPARISON:  MR head without and with contrast 03/02/2020. FINDINGS: Brain: No acute infarct, hemorrhage, the diffusion-weighted images demonstrate no acute or subacute infarction. Advanced atrophy and diffuse white matter disease is present bilaterally, similar the prior exam. The ventricles are proportionate to the degree of atrophy. No significant extraaxial fluid collection is present. Basal ganglia are within normal limits. T2 hyperintensities are present in the external capsule bilaterally. Insular ribbon is normal. The internal auditory canals are within normal limits. The brainstem and cerebellum are within normal limits. Vascular: Flow is present in the major intracranial arteries. Skull and upper cervical spine: The craniocervical junction is normal. Upper cervical spine is within normal limits. Marrow signal is unremarkable. Sinuses/Orbits: Small mastoid effusions are present bilaterally. No obstructing nasopharyngeal lesion is present. Bilateral lens replacements are noted. Globes and orbits are otherwise unremarkable. IMPRESSION: 1. No acute intracranial abnormality or significant interval change. 2. Advanced atrophy and diffuse white matter disease likely reflects the sequela of chronic microvascular ischemia. 3. Small mastoid effusions bilaterally. No obstructing nasopharyngeal lesion is present. Electronically Signed   By: San Morelle M.D.   On: 07/25/2021 18:03   MR ANGIO HEAD WO CONTRAST  Result Date: 07/25/2021 CLINICAL DATA:  Neuro  deficit. Stroke suspected. Transient dizziness and nausea intermittently when turning head to the left. EXAM: MRA HEAD WITHOUT CONTRAST TECHNIQUE: Angiographic images of the Circle of Willis were acquired using MRA technique without intravenous contrast. COMPARISON:  None Available. FINDINGS: Anterior circulation: The internal carotid arteries are within normal limits from the high cervical segments through the ICA termini. The A1 and M1 segments are normal. Anterior communicating artery is patent. MCA bifurcations are within normal limits. The ACA and MCA branch vessels are normal. Posterior circulation: Vertebral arteries are codominant. PICA origins are visualized and normal. Vertebrobasilar junction basilar artery are normal. The posterior cerebral arteries are within normal limits bilaterally. Anatomic variants: None IMPRESSION: Normal MRA circle-of-Willis without evidence for significant proximal stenosis, aneurysm, or branch vessel occlusion. Electronically Signed   By: San Morelle M.D.   On: 07/25/2021 17:50    Procedures Procedures    Medications Ordered in ED Medications  ondansetron (ZOFRAN-ODT) disintegrating  tablet 4 mg (4 mg Oral Given 07/25/21 1039)  sodium chloride 0.9 % bolus 1,000 mL (0 mLs Intravenous Stopped 07/25/21 1851)  gadobutrol (GADAVIST) 1 MMOL/ML injection 8 mL (8 mLs Intravenous Contrast Given 07/25/21 1752)    ED Course/ Medical Decision Making/ A&P                           Medical Decision Making Amount and/or Complexity of Data Reviewed Radiology: ordered.  Risk Prescription drug management.    Cheryl [name] is a 81 y.o. female with a past medical history significant for anxiety, depression, granulomatous lung disease, migraines, cervical spine stenosis, known carotid stenosis, and previous appendectomy/hysterectomy who presents with nausea and dizziness.  According to patient, weekly for the last month or 2 she has been having episodes of severe  dizziness and world spinning sensation where she had difficulty ambulating.  She also reports intense nausea and occasional neck pain.  She reports that her symptoms can be exacerbated by turning her neck to the left but not every time.  She denies any trauma.  She denies any vision changes or speech abnormalities.  Denies any extremity symptoms.  She denies any fevers, chills, congestion, cough, constipation, diarrhea, or urinary changes.  On exam, lungs clear and chest nontender.  Abdomen nontender.  Patient moving all extremities.  Intact finger-nose-finger testing and sensation and strength in extremities.  Symmetric smile.  Clear speech.  Pupils are symmetric and reactive with normal extraocular movements.  I could not appreciate a carotid bruit on my exam.  I could not provoke her dizziness with turning her head to the left.  Good pulses in extremities.  Clinically I am concerned about several etiologies.  We discussed this could be BPPV however with the provocation of turning her neck to the left and the intent symptoms I am concerned about either a intermittent vertebrobasilar syndrome from vascular disease or even intermittent stroke/TIA.  I spoke to radiology who recommended vascular imaging and MRI of her head.  However patient does have a contrast allergy so instead we will do MRA head and neck and MRI brain without contrast.  This was recommended by radiology.  She will get some fluids for likely mild dehydration with dry mucous membranes but otherwise we will reassess after imaging.  MRA of the head and neck and brain MRI did not show evidence of acute stroke.  Atrophy is seen.  Left internal carotid had 50% stenosis which is slightly increased from previous ultrasound however does not appear critically so.  Right ICA was irregular but otherwise no critical abnormality seen.  Patient is feeling much better after fluids and was able to ambulate without difficulty and was able to eat a  cheeseburger.  She is feeling much better.  We will give her a prescription for meclizine as I now suspect this is more vertiginous and BPPV.  Patient was instructed on Epley maneuver and will follow-up with outpatient PCP this week.  She understood return precautions and plan of care and was discharged in good condition.         Final Clinical Impression(s) / ED Diagnoses Final diagnoses:  Dizziness  Nausea    Rx / DC Orders ED Discharge Orders          Ordered    meclizine (ANTIVERT) 25 MG tablet  3 times daily PRN        07/25/21 1916  Clinical Impression: 1. Dizziness   2. Nausea     Disposition: Discharge  Condition: Good  I have discussed the results, Dx and Tx plan with the pt(& family if present). He/she/they expressed understanding and agree(s) with the plan. Discharge instructions discussed at great length. Strict return precautions discussed and pt &/or family have verbalized understanding of the instructions. No further questions at time of discharge.    New Prescriptions   MECLIZINE (ANTIVERT) 25 MG TABLET    Take 1 tablet (25 mg total) by mouth 3 (three) times daily as needed for dizziness.    Follow Up: Hali Marry, [place] [id number] [place] DEPT [place] [id number] [facility] [address] [place]       [place] Pierron, Gwenyth Allegra, MD 07/25/21 [phone]

## 2021-07-25 NOTE — ED Triage Notes (Signed)
Pt presents via EMS from home with c/o nausea/dizziness. Pt woke up with the dizziness and had 2 episodes of vomiting, denies any diarrhea. Pt has a hx of migraines but none present at this time. Pt is c/o neck pain to the back of her neck. Pt is alert and oriented.

## 2021-07-25 NOTE — ED Provider Triage Note (Signed)
Emergency Medicine Provider Triage Evaluation Note  Cheryl [name] , a 81 y.o. female  was evaluated in triage.  Pt complains of intractable headache.  Hx of migraines.  Also with some nausea and NBNB vomiting.  Denies abdominal pain, fevers, chest pain, shortness of breath, extremity weakness, stiff neck.  Cheryl [name] yesterday with some relief.  Has not taken it today.  Review of Systems  Positive:  Negative: As above  Physical Exam  BP (!) 142/78 (BP Location: Left Arm)   Pulse 77   Temp 98.8 F (37.1 C) (Oral)   Resp 16   SpO2 99%  Gen:   Awake, no distress   Resp:  Normal effort, CTAB MSK:   Moves extremities without difficulty  Other:  Abdomen soft nontender.  No flank pain.  RRR without M/R/G.  No facial asymmetry.  Gaze aligned appropriately.  No unilateral weakness.  Medical Decision Making  Medically screening exam initiated at 10:25 AM.  Appropriate orders placed.  Cheryl [name] was informed that the remainder of the evaluation will be completed by another provider, this initial triage assessment does not replace that evaluation, and the importance of remaining in the ED until their evaluation is complete.  Zofran ordered   Cheryl Rome, PA-C 51/70/01 1033

## 2021-07-25 NOTE — ED Notes (Signed)
Patient transported to MRI 

## 2021-07-28 ENCOUNTER — Encounter: Payer: Self-pay | Admitting: Family Medicine

## 2021-07-28 ENCOUNTER — Ambulatory Visit (INDEPENDENT_AMBULATORY_CARE_PROVIDER_SITE_OTHER): Payer: PPO | Admitting: Family Medicine

## 2021-07-28 VITALS — BP 122/49 | HR 72 | Wt 182.0 lb

## 2021-07-28 DIAGNOSIS — M542 Cervicalgia: Secondary | ICD-10-CM | POA: Diagnosis not present

## 2021-07-28 DIAGNOSIS — G8929 Other chronic pain: Secondary | ICD-10-CM | POA: Diagnosis not present

## 2021-07-28 DIAGNOSIS — G3184 Mild cognitive impairment, so stated: Secondary | ICD-10-CM

## 2021-07-28 DIAGNOSIS — F03B4 Unspecified dementia, moderate, with anxiety: Secondary | ICD-10-CM

## 2021-07-28 DIAGNOSIS — H8110 Benign paroxysmal vertigo, unspecified ear: Secondary | ICD-10-CM | POA: Diagnosis not present

## 2021-07-28 MED ORDER — DONEPEZIL HCL 5 MG PO TABS: 5.0000 mg | ORAL_TABLET | Freq: Every day | ORAL | 0 refills | Status: DC

## 2021-07-28 MED ORDER — DONEPEZIL HCL 10 MG PO TABS: 10.0000 mg | ORAL_TABLET | Freq: Every day | ORAL | 1 refills | Status: DC

## 2021-07-28 NOTE — Progress Notes (Signed)
Established Patient Office Visit  Subjective   Patient ID: Cheryl [name], female    DOB: January 25, 1941  Age: 81 y.o. MRN: [id number]  Chief Complaint  Patient presents with   memory issues    HPI  Spoke with Cheryl [name] her daughter and listened to her concerns she does feel like her memory is actually getting worse.  She feels like in part it is because she is primarily homebound and then when she does get the opportunity to go out she is nervous about going out.  We discussed some further options.  She was noted to have some decline on her last memory test in January.  We will go ahead and place referral for her memory clinic at [facility].  Depending on the wait to get her in we may schedule her to repeat the MoCA and even consider a trial of medication this summer if needed.  Last brain MRI was in January 2022.  At that time it showed mild frontal lobe atrophy and moderate to advanced chronic microvascular ischemic change throughout the white matter.  So struggled more recently with some vertigo in fact she went to the emergency department a few days ago on June 18.  She says its been years since she has had issues with vertigo.  But it was pretty intense.  She still feels a little bit off but feels better than she did.  They did do MRI of the brain as well as MR angio of the neck without contrast.  It showed possible 50% stenosis of the proximal left ICA but they have been following that with her cardiologist and on last check it was not 50% it was less than that.  They also noted a little mild atherosclerotic irregularity along the right ICA but no significant stenosis.  And normal appearance of the vertebral arteries.  She also had advanced atrophy and diffuse white matter disease most likely reflective of chronic microvascular ischemia which can be secondary to having migraines.  She is also really still struggling with her migraine headaches.  She said she got significant relief after she had  cervical ablation almost a year ago in August.  The headaches have been coming back more persistently.  She was finally able to get the Ubrelvy and does feel that its been helpful but has been nervous to take it because she does not want to run out.  She started to have more frequent daily headaches again.    ROS    Objective:     BP (!) 122/49   Pulse 72   Wt 182 lb (82.6 kg)   SpO2 97%   BMI 30.29 kg/m    Physical Exam Vitals reviewed.  Constitutional:      Appearance: She is well-developed.  HENT:     Head: Normocephalic and atraumatic.  Eyes:     Conjunctiva/sclera: Conjunctivae normal.  Cardiovascular:     Rate and Rhythm: Normal rate.  Pulmonary:     Effort: Pulmonary effort is normal.  Skin:    General: Skin is dry.     Coloration: Skin is not pale.  Neurological:     Mental Status: She is alert and oriented to person, place, and time.  Psychiatric:        Behavior: Behavior normal.      No results found for any visits on 07/28/21.  Last CBC Lab Results  Component Value Date   WBC 7.8 07/25/2021   HGB 14.7 07/25/2021   HCT  44.3 07/25/2021   MCV 87.4 07/25/2021   MCH 29.0 07/25/2021   RDW 12.9 07/25/2021   PLT 255 07/25/2021   Last hemoglobin A1c Lab Results  Component Value Date   HGBA1C 5.6 02/15/2021   Last vitamin B12 and Folate Lab Results  Component Value Date   VITAMINB12 550 02/15/2021   FOLATE 21.6 09/26/2017      The 10-year ASCVD risk score (Arnett DK, et al., 2019) is: 21.7%    Assessment & Plan:   Problem List Items Addressed This Visit       Nervous and Auditory   Dementia ([place])    07/2021: MOCA 20/30 To the impairment most consistent with Alzheimer's or chronic vascular changes.  We reviewed her results and discussed options including starting potentially prescription medication.  Again she is also had prior imaging.  It can take about 4 months to get her in at the memory clinic at [facility] so I think in the interim  we could certainly do a trial of Aricept and see if it is helpful and they can follow-up at the 40-monthevaluation to see if it is recommended to continue it or if she may need to switch medications or consider add-on therapy such as Namenda.  I do feel like she has had a decline over the last year.  She is becoming more anxious to get out of the house.      Relevant Medications   donepezil (ARICEPT) 5 MG tablet   donepezil (ARICEPT) 10 MG tablet     Other   Chronic neck pain    She really did well with the ablation last summer.  And felt like it made a tremendous difference in controlling her headaches.  We discussed maybe getting back in with the Ortho group that did this and maybe have another consultation to determine if she would benefit from having another ablation done whether it is the same location or a different location we did discuss that sometimes the nerves can heal and repair themselves and sometimes repeat ablation is necessary.      Other Visit Diagnoses     Benign paroxysmal positional vertigo, unspecified laterality    -  Primary   Relevant Orders   Ambulatory referral to Physical Therapy      BPPV-even though she is feeling somewhat better I think we should go ahead and refer her for formal vestibular rehab.  If the symptoms 100% resolved between now and then we can always cancel the appointment and circle back if symptoms recur.  But I do think it would be really helpful for her.  Return in about 6 months (around 01/27/2022) for New start medication.    CBeatrice Lecher MD

## 2021-07-28 NOTE — Assessment & Plan Note (Addendum)
07/2021: MOCA 20/30 To the impairment most consistent with Alzheimer's or chronic vascular changes.  We reviewed her results and discussed options including starting potentially prescription medication.  Again she is also had prior imaging.  It can take about 4 months to get her in at the memory clinic at [facility] so I think in the interim we could certainly do a trial of Aricept and see if it is helpful and they can follow-up at the 53-monthevaluation to see if it is recommended to continue it or if she may need to switch medications or consider add-on therapy such as Namenda.  I do feel like she has had a decline over the last year.  She is becoming more anxious to get out of the house.

## 2021-08-03 ENCOUNTER — Other Ambulatory Visit: Payer: Self-pay

## 2021-08-03 ENCOUNTER — Encounter: Payer: Self-pay | Admitting: Rehabilitative and Restorative Service Providers"

## 2021-08-03 ENCOUNTER — Ambulatory Visit: Payer: PPO | Attending: Family Medicine | Admitting: Rehabilitative and Restorative Service Providers"

## 2021-08-03 DIAGNOSIS — H8110 Benign paroxysmal vertigo, unspecified ear: Secondary | ICD-10-CM | POA: Diagnosis not present

## 2021-08-03 DIAGNOSIS — R42 Dizziness and giddiness: Secondary | ICD-10-CM | POA: Diagnosis not present

## 2021-08-03 DIAGNOSIS — R2689 Other abnormalities of gait and mobility: Secondary | ICD-10-CM | POA: Diagnosis not present

## 2021-08-03 NOTE — Therapy (Signed)
OUTPATIENT PHYSICAL THERAPY VESTIBULAR EVALUATION     Patient Name: Cheryl [name] MRN: [id number] DOB:1940-11-30, 81 y.o., female Today's Date: 08/03/2021  PCP: Agapito Games, MD  REFERRING PROVIDER: Agapito Games, MD    PT End of Session - 08/03/21 1112     Visit Number 1    Date for PT Re-Evaluation 09/24/21    Authorization Type [employer]    PT Start Time 1100    PT Stop Time 1145    PT Time Calculation (min) 45 min    Activity Tolerance Patient tolerated treatment well    Behavior During Therapy [facility] for tasks assessed/performed             Past Medical History:  Diagnosis Date   Anemia    Anxiety    d/t recent family issues   Arthritis    Bone spur    Carotid stenosis, bilateral 06/06/2019   Chronic back pain    stenosis and OA   Depression    takes Paxil daily   Diverticulosis    Dizzy    occasionally   Encephalitis    at 9 months ago    Fatty liver    has had 2 Hep shots and the final one is in June 18   GERD (gastroesophageal reflux disease)    takes Omeprazole daily   Hepatitis 2012   History of bronchitis    20-25 yrs ago   History of colon polyps    precancerous   History of shingles    Hyperlipidemia    takes Simvastatin daily   IBS (irritable bowel syndrome)    more on side of constipation-takes Miralax daily   Insomnia    takes Melatonin and Ambien nightly   Internal hemorrhoids    Joint pain    Joint swelling    Lung nodules    calcified    Migraine headache    migraines-thinks coming from neck.Last one1/28/18   Pneumonia    hx of-20 yrs ago-=-walking   Renal cyst    left   Uterine cancer (HCC) 1973   Past Surgical History:  Procedure Laterality Date   APPENDECTOMY  1966   cataracts     bilateral   COLONOSCOPY  09/04/2020   EUS  02/10/2011   Procedure: UPPER ENDOSCOPIC ULTRASOUND (EUS) RADIAL;  Surgeon: Rob Bunting, MD;  Location: [facility] ENDOSCOPY;  Service: Endoscopy;  Laterality: N/A;  radial  linear    LUMBAR FUSION  03/2016   L4-5, Dr. Sampson Goon.   RECTOCELE REPAIR  2011   ROTATOR CUFF REPAIR  2009   left   TONSILLECTOMY     UPPER GASTROINTESTINAL ENDOSCOPY     VAGINAL HYSTERECTOMY  1973   Patient Active Problem List   Diagnosis Date Noted   DDD (degenerative disc disease), cervical 03/01/2021   Stenosis of cervical spine with myelopathy (HCC) 03/01/2021   Dementia (HCC) 02/15/2021   Pharyngoesophageal dysphagia 12/14/2020   Osteopenia 10/23/2020   Abnormal glucose 08/13/2020   Chronic migraine without aura, with intractable migraine, so stated, with status migrainosus 07/03/2019   Carotid stenosis, bilateral 06/06/2019   Migraine headache 03/22/2019   Daily headache 03/22/2019   Myofascial pain 03/27/2018   Numbness of left foot 11/07/2017   Right foot pain 09/12/2017   Deviated septum 01/25/2017   ETD (Eustachian tube dysfunction), bilateral 01/25/2017   Seasonal allergic rhinitis 01/25/2017   Bilateral hearing loss 01/25/2017   S/P lumbar fusion 06/20/2016   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  Fatty liver 12/22/2015   Onychodystrophy 11/24/2015   Hyperlipidemia 12/17/2013   Osteoarthritis of left knee 07/05/2013   Obesity 03/10/2011   Dyspepsia 02/10/2011   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   PALPITATIONS 02/11/2010   OSTEOPENIA 11/27/2009   CONSTIPATION, CHRONIC 08/15/2008   Insomnia 07/08/2008   Chronic neck pain 04/11/2008   Disorder of kidney and ureter 01/21/2008   HEMORRHOIDS, INTERNAL 01/16/2008   Diverticulosis of colon 01/16/2008   POSTMENOPAUSAL STATUS 12/05/2007   MICROSCOPIC HEMATURIA 06/05/2007   STENOSIS, LUMBAR SPINE 12/22/2005   Anxiety and depression 11/15/2005   IRRITABLE BOWEL SYNDROME 11/15/2005   ARTHRITIS 11/15/2005    ONSET DATE: July 25, 2021 went to ED with dizziness  REFERRING DIAG: H81.10 (ICD-10-CM) - Benign paroxysmal positional vertigo, unspecified laterality   THERAPY DIAG:   Dizziness and giddiness  Balance problem  Rationale for Evaluation and Treatment Rehabilitation  SUBJECTIVE:   SUBJECTIVE STATEMENT: Pt reports that on 07/25/21 she woke up with such severe dizziness and that she needed to go to the ED.  During questioning, pt states that she has had a history of dizziness in her 34s and went to a chiropractor and she did well.  She reports that she has been having dizziness for the past couple of years, particularly with head turns or when rolling over to her left side in bed. Pt accompanied by: self  PERTINENT HISTORY: Dementia, Chronic Neck Pain   PAIN:  Are you having pain? No  PRECAUTIONS: Fall  WEIGHT BEARING RESTRICTIONS No  FALLS: Has patient fallen in last 6 months? No  LIVING ENVIRONMENT: Lives with: lives with their spouse Lives in: House/apartment Stairs: Yes: External: 1 steps; no rails at ramp currently, but getting them installed soon Has following equipment at home: Single point cane, Walker - 4 wheeled, bed side commode, and Grab bars  PLOF: Independent  PATIENT GOALS:  To not be dizzy  OBJECTIVE:   DIAGNOSTIC FINDINGS: n/a  COGNITION: Overall cognitive status:  Pt reports that she was doing well cognitively until the Pandemic hit and she started staying in too long, then she started having some word finding problems.  Pt now with diagnosis of dementia.   SENSATION: WFL  POSTURE: rounded shoulders and forward head   Cervical ROM:    Active A/PROM (deg) eval  Flexion 45  Extension 30  Right lateral flexion 30  Left lateral flexion 35  Right rotation   Left rotation   (Blank rows = not tested)  STRENGTH: WFL UE and LE   GAIT: Gait pattern: decreased step length- Right, decreased step length- Left, and decreased trunk rotation Distance walked: 100 ft Assistive device utilized: None Level of assistance: Complete Independence Comments: Following Epley Maneuver, pt reports that she felt more  steady  FUNCTIONAL TESTs:  5 times sit to stand: To assess next visit Timed up and go (TUG): To assess next visit  PATIENT SURVEYS:  08/03/2021: DHI Total Score: 54 / 100 Physical Score: 8 / 28 Emotional Score: 26 / 36 Functional Score: 20 / 36  VESTIBULAR ASSESSMENT   GENERAL OBSERVATION: Pt is a pleasant female with noted decreased cervical motion noted during initial evaluation    SYMPTOM BEHAVIOR:   Subjective history: Pt reports that she has been having some dizziness for the past couple of years, but it got worse on 07/25/21   Non-Vestibular symptoms: neck pain, nausea/vomiting, and migraine symptoms   Type of dizziness: Spinning/Vertigo   Frequency: Pt reports that the dizziness has improved since 6/18,  but she is still having some dizziness   Duration: depends on the time   Aggravating factors: Induced by position change: rolling to the left and Induced by motion: occur when walking, sitting in a moving car, and activity in general   Relieving factors: head stationary, slow movements, and avoid busy/distracting environments   Progression of symptoms: better   OCULOMOTOR EXAM:   Ocular Alignment: normal   Ocular ROM: No Limitations   Spontaneous Nystagmus: absent   Gaze-Induced Nystagmus: absent   Smooth Pursuits: intact   Saccades: intact     VESTIBULAR - OCULAR REFLEX:    Positive head thrust test to bilateral sides for nystagmus    POSITIONAL TESTING:  08/03/2021: Right Dix-Hallpike: no nystagmus Left Dix-Hallpike: nystagmus noted and Duration: 15 sec    VESTIBULAR TREATMENT: 08/03/2021: Canalith Repositioning:   Epley Left: Number of Reps: 2 and Response to Treatment: symptoms improved  PATIENT EDUCATION: Education details: Provided with HEP printout Person educated: Patient Education method: Explanation and Handouts Education comprehension: verbalized understanding  HOME EXERCISE PROGRAM: Access Code: [email or web address] URL:  [email or web address] Date: 08/03/2021 Prepared by: Reather Laurence  Exercises - Brandt-Daroff Vestibular Exercise  - 1 x daily - 7 x weekly - 1 sets - 5 reps  Patient Education - BPPV   GOALS: Goals reviewed with patient? Yes  SHORT TERM GOALS: Target date: 08/24/2021   Pt will be independent with initial HEP. Baseline: Goal status: INITIAL  2.  Pt will report at least a 40% improvement in dizziness symptoms. Baseline:  Goal status: INITIAL   LONG TERM GOALS: Target date: 09/28/2021    Pt will be independent with advanced HEP. Baseline:  Goal status: INITIAL  2.  Pt will improve Dizziness Handicapped Inventory sore to less than 30/100 to indicate decreased dizziness with tasks. Baseline: 54/100 Goal status: INITIAL  3.  Pt will demonstrate negative Gilberto Better consistently by last PT treatment session. Baseline:  Goal status: INITIAL  4.  Pt will report at least an 80% improvement in dizziness during daily tasks and rolling over in bed. Baseline:  Goal status: INITIAL   ASSESSMENT:  CLINICAL IMPRESSION: Patient is a 81 y.o. female who was seen today for physical therapy evaluation and treatment for BPPV/dizziness. Pts PLOF is independent and able to care for her dog and perform other tasks without dizziness.  Pt presents to PT Evaluation with a reported history of dizziness intermittently since she was in her 4s.  However, primarily, symptoms were negligible and she thought that it was "normal."  Pt states that she has noted some dizziness periodically over the past couple of years when she rolls over to the left side in bed and states that if she is having a dizzy spell, she is worried that when people walk behind her that they may think that she is intoxicated.  Pt states that when the COVID pandemic hit, she started staying in more and subsequently noticed that she was having more dizziness, so she stayed at home and limited her activity even more.  Pt  presents during PT session with signs of vestibular hypofunctioning and positive left sided Gilberto Better and proceeded with treatment utilizing Epley Maneuver.  During second round of Epley Maneuver, pt without dizziness or nystagmus noted.  Following Epley Maneuver, when pt walking out of clinic, she did state that she was feeling much more stable than she had previously. Pt would benefit from skilled PT to address her functional impairments to allow her to perform daily tasks without  dizziness.   OBJECTIVE IMPAIRMENTS Abnormal gait, decreased balance, and dizziness.   ACTIVITY LIMITATIONS locomotion level  PARTICIPATION LIMITATIONS: community activity  PERSONAL FACTORS Age, Time since onset of injury/illness/exacerbation, and 1-2 comorbidities: Chronic Neck Pain, Dementia  are also affecting patient's functional outcome.   REHAB POTENTIAL: Good  CLINICAL DECISION MAKING: Evolving/moderate complexity  EVALUATION COMPLEXITY: Moderate   PLAN: PT FREQUENCY: 2x/week  PT DURATION: 8 weeks  PLANNED INTERVENTIONS: Therapeutic exercises, Therapeutic activity, Neuromuscular re-education, Balance training, Gait training, Patient/Family education, Joint manipulation, Joint mobilization, Stair training, Vestibular training, Canalith repositioning, Aquatic Therapy, Dry Needling, Electrical stimulation, Spinal manipulation, Spinal mobilization, Cryotherapy, Moist heat, Taping, Traction, Manual therapy, and Re-evaluation  PLAN FOR NEXT SESSION: Gilberto Better and Canalith repositioning as needed, vestibular rehab, balance   Reather Laurence, PT 08/03/2021, 12:21 PM   [facility] [address], [address] [place], [place] [address] Phone # [phone] Fax [phone]

## 2021-08-04 ENCOUNTER — Encounter: Payer: Self-pay | Admitting: Family Medicine

## 2021-08-04 NOTE — Telephone Encounter (Signed)
[facility] assist application ready for signature. I called [employer] to get a copy of the approval letter. They will fax to our office within 10-15 minutes.

## 2021-08-04 NOTE — Telephone Encounter (Signed)
Had sent ovr meclinzine for up to 3 x a day for dizziness and nausea. Is she already taking that?

## 2021-08-05 NOTE — Telephone Encounter (Signed)
I would say to start with the meclizine if she has not really tried it yet because it can help with nausea and vertigo and especially if the nausea is being triggered by the vertigo.  But if that is not enough I am absolutely fine with sending over some Zofran or Phenergan for her.

## 2021-08-09 ENCOUNTER — Ambulatory Visit: Payer: PPO | Admitting: Rehabilitative and Restorative Service Providers"

## 2021-08-12 ENCOUNTER — Ambulatory Visit: Payer: PPO | Attending: Family Medicine | Admitting: Rehabilitative and Restorative Service Providers"

## 2021-08-12 ENCOUNTER — Encounter: Payer: Self-pay | Admitting: Rehabilitative and Restorative Service Providers"

## 2021-08-12 DIAGNOSIS — R2689 Other abnormalities of gait and mobility: Secondary | ICD-10-CM | POA: Insufficient documentation

## 2021-08-12 DIAGNOSIS — R42 Dizziness and giddiness: Secondary | ICD-10-CM | POA: Insufficient documentation

## 2021-08-12 NOTE — Therapy (Signed)
OUTPATIENT PHYSICAL THERAPY VESTIBULAR TREATMENT NOTE     Patient Name: Cheryl [name] MRN: [id number] DOB:1940/05/07, 81 y.o., female Today's Date: 08/12/2021  PCP: Hali Marry, MD  REFERRING PROVIDER: Hali Marry, MD    PT End of Session - 08/12/21 0848     Visit Number 2    Date for PT Re-Evaluation 09/24/21    Authorization Type [employer]    PT Start Time 0845    PT Stop Time 0925    PT Time Calculation (min) 40 min    Activity Tolerance Patient tolerated treatment well    Behavior During Therapy [facility] for tasks assessed/performed             Past Medical History:  Diagnosis Date   Anemia    Anxiety    d/t recent family issues   Arthritis    Bone spur    Carotid stenosis, bilateral 06/06/2019   Chronic back pain    stenosis and OA   Depression    takes Paxil daily   Diverticulosis    Dizzy    occasionally   Encephalitis    at 9 months ago    Fatty liver    has had 2 Hep shots and the final one is in June 18   GERD (gastroesophageal reflux disease)    takes Omeprazole daily   Hepatitis 2012   History of bronchitis    20-25 yrs ago   History of colon polyps    precancerous   History of shingles    Hyperlipidemia    takes Simvastatin daily   IBS (irritable bowel syndrome)    more on side of constipation-takes Miralax daily   Insomnia    takes Melatonin and Ambien nightly   Internal hemorrhoids    Joint pain    Joint swelling    Lung nodules    calcified    Migraine headache    migraines-thinks coming from neck.Last one1/28/18   Pneumonia    hx of-20 yrs ago-=-walking   Renal cyst    left   Uterine cancer ([place]) 1973   Past Surgical History:  Procedure Laterality Date   APPENDECTOMY  1966   cataracts     bilateral   COLONOSCOPY  09/04/2020   EUS  02/10/2011   Procedure: UPPER ENDOSCOPIC ULTRASOUND (EUS) RADIAL;  Surgeon: Owens Loffler, MD;  Location: [facility];  Service: Endoscopy;  Laterality: N/A;  radial  linear    LUMBAR FUSION  03/2016   L4-5, Dr. Ola Spurr.   [place]   ROTATOR CUFF REPAIR  2009   left   TONSILLECTOMY     UPPER GASTROINTESTINAL ENDOSCOPY     VAGINAL HYSTERECTOMY  1973   Patient Active Problem List   Diagnosis Date Noted   DDD (degenerative disc disease), cervical 03/01/2021   Stenosis of cervical spine with myelopathy ([place]) 03/01/2021   Dementia ([place]) 02/15/2021   Pharyngoesophageal dysphagia 12/14/2020   Osteopenia 10/23/2020   Abnormal glucose 08/13/2020   Chronic migraine without aura, with intractable migraine, so stated, with status migrainosus 07/03/2019   Carotid stenosis, bilateral 06/06/2019   Migraine headache 03/22/2019   Daily headache 03/22/2019   Myofascial pain 03/27/2018   Numbness of left foot 11/07/2017   Right foot pain 09/12/2017   Deviated septum 01/25/2017   ETD (Eustachian tube dysfunction), bilateral 01/25/2017   Seasonal allergic rhinitis 01/25/2017   Bilateral hearing loss 01/25/2017   S/P lumbar fusion 06/20/2016   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  Fatty liver 12/22/2015   Onychodystrophy 11/24/2015   Hyperlipidemia 12/17/2013   Osteoarthritis of left knee 07/05/2013   Obesity 03/10/2011   Dyspepsia 02/10/2011   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   PALPITATIONS 02/11/2010   OSTEOPENIA 11/27/2009   CONSTIPATION, CHRONIC 08/15/2008   Insomnia 07/08/2008   Chronic neck pain 04/11/2008   Disorder of kidney and ureter 01/21/2008   HEMORRHOIDS, INTERNAL 01/16/2008   Diverticulosis of colon 01/16/2008   POSTMENOPAUSAL STATUS 12/05/2007   MICROSCOPIC HEMATURIA 06/05/2007   STENOSIS, LUMBAR SPINE 12/22/2005   Anxiety and depression 11/15/2005   IRRITABLE BOWEL SYNDROME 11/15/2005   ARTHRITIS 11/15/2005    ONSET DATE: July 25, 2021 went to ED with dizziness  REFERRING DIAG: H81.10 (ICD-10-CM) - Benign paroxysmal positional vertigo, unspecified laterality   THERAPY DIAG:   Dizziness and giddiness  Balance problem  Rationale for Evaluation and Treatment Rehabilitation  SUBJECTIVE:   SUBJECTIVE STATEMENT: Pt reports that she felt better with dizziness following last session, but did have nausea.  States that she threw up the day following the evaluation and has been nauseous since.  States that she did her HEP twice since last session. Pt accompanied by: self  PERTINENT HISTORY: Dementia, Chronic Neck Pain   PAIN:  Are you having pain? No  PRECAUTIONS: Fall  WEIGHT BEARING RESTRICTIONS No  FALLS: Has patient fallen in last 6 months? No  LIVING ENVIRONMENT: Lives with: lives with their spouse Lives in: House/apartment Stairs: Yes: External: 1 steps; no rails at ramp currently, but getting them installed soon Has following equipment at home: Single point cane, Walker - 4 wheeled, bed side commode, and Grab bars  PLOF: Independent  PATIENT GOALS:  To not be dizzy  OBJECTIVE:   DIAGNOSTIC FINDINGS: n/a  COGNITION: Overall cognitive status:  Pt reports that she was doing well cognitively until the Pandemic hit and she started staying in too long, then she started having some word finding problems.  Pt now with diagnosis of dementia.   SENSATION: WFL  POSTURE: rounded shoulders and forward head   Cervical ROM:    Active A/PROM (deg) eval  Flexion 45  Extension 30  Right lateral flexion 30  Left lateral flexion 35  Right rotation   Left rotation   (Blank rows = not tested)  STRENGTH: WFL UE and LE   GAIT: Gait pattern: decreased step length- Right, decreased step length- Left, and decreased trunk rotation Distance walked: 100 ft Assistive device utilized: None Level of assistance: Complete Independence Comments: Following Epley Maneuver, pt reports that she felt more steady  FUNCTIONAL TESTs:  5 times sit to stand: To assess next visit Timed up and go (TUG): To assess next visit  PATIENT SURVEYS:  08/03/2021: [place] Total  Score: 54 / 100 Physical Score: 8 / 28 Emotional Score: 26 / 36 Functional Score: 20 / 36  VESTIBULAR ASSESSMENT   GENERAL OBSERVATION: Pt is a pleasant female with noted decreased cervical motion noted during initial evaluation    SYMPTOM BEHAVIOR:   Subjective history: Pt reports that she has been having some dizziness for the past couple of years, but it got worse on 07/25/21   Non-Vestibular symptoms: neck pain, nausea/vomiting, and migraine symptoms   Type of dizziness: Spinning/Vertigo   Frequency: Pt reports that the dizziness has improved since 6/18, but she is still having some dizziness   Duration: depends on the time   Aggravating factors: Induced by position change: rolling to the left and Induced by motion: occur when walking,  sitting in a moving car, and activity in general   Relieving factors: head stationary, slow movements, and avoid busy/distracting environments   Progression of symptoms: better   OCULOMOTOR EXAM:   Ocular Alignment: normal   Ocular ROM: No Limitations   Spontaneous Nystagmus: absent   Gaze-Induced Nystagmus: absent   Smooth Pursuits: intact   Saccades: intact     VESTIBULAR - OCULAR REFLEX:    Positive head thrust test to bilateral sides for nystagmus    POSITIONAL TESTING:  08/03/2021: Right Dix-Hallpike: no nystagmus Left Dix-Hallpike: nystagmus noted and Duration: 15 sec    VESTIBULAR TREATMENT: 7/6/023: VOR for head nod and head rotation x1 min each with brief recovery periods Pencil push ups 2x10 Nestor Lewandowsky x2 reps bilat Epley Maneuver x3 reps, response to treatment:  symptoms improved.  Noted at least 20 secs of nystagmus on first [employer], 10 seconds on second L [employer], and no nystagmus on third L Marye Round  08/03/2021: Canalith Repositioning:   Epley Left: Number of Reps: 2 and Response to Treatment: symptoms improved  PATIENT EDUCATION: Education details: Provided with HEP printout Person educated:  Patient Education method: Explanation and Handouts Education comprehension: verbalized understanding  HOME EXERCISE PROGRAM: Access Code: [email or web address] URL: [email or web address] Date: 08/12/2021 Prepared by: Juel Burrow  Exercises - Brandt-Daroff Vestibular Exercise  - 1 x daily - 7 x weekly - 1 sets - 5 reps - Seated Gaze Stabilization with Head Nod  - 1 x daily - 7 x weekly - 3 sets - 50 reps - Seated Gaze Stabilization with Head Rotation  - 1 x daily - 7 x weekly - 3 sets - 50 reps - Pencil Pushups  - 1 x daily - 7 x weekly - 2 sets - 10 reps  Patient Education - BPPV   GOALS: Goals reviewed with patient? Yes  SHORT TERM GOALS: Target date: 08/24/2021   Pt will be independent with initial HEP. Baseline: Goal status: IN PROGRESS  2.  Pt will report at least a 40% improvement in dizziness symptoms. Baseline:  Goal status: INITIAL   LONG TERM GOALS: Target date: 09/28/2021    Pt will be independent with advanced HEP. Baseline:  Goal status: INITIAL  2.  Pt will improve Dizziness Handicapped Inventory sore to less than 30/100 to indicate decreased dizziness with tasks. Baseline: 54/100 Goal status: INITIAL  3.  Pt will demonstrate negative Marye Round consistently by last PT treatment session. Baseline:  Goal status: INITIAL  4.  Pt will report at least an 80% improvement in dizziness during daily tasks and rolling over in bed. Baseline:  Goal status: INITIAL   ASSESSMENT:  CLINICAL IMPRESSION: Ms Mcclish reports that she had to cancel PT visit on Monday secondary to a migraine, but states that she has overall noted decreased dizziness since initial evaluation but continued nausea and has only performed HEP twice.  Educated pt on the importance of compliance with HEP, as this is what will help her decrease her symptoms.  She verbalizes her understanding.  Pt able to demonstrate [facility] with minimal cuing.  Instructed pt on seated VOR exercises  and provided as part of HEP to assist with vestibular habituation.  On third Epley Maneuver, pt without any dizziness.  Following session pt reports no dizziness and believes that her nausea has improved.  Pt continues to require skilled PT to progress towards goal related activities.   OBJECTIVE IMPAIRMENTS Abnormal gait, decreased balance, and dizziness.   ACTIVITY LIMITATIONS locomotion level  PARTICIPATION LIMITATIONS: community activity  PERSONAL FACTORS Age, Time since onset of injury/illness/exacerbation, and 1-2 comorbidities: Chronic Neck Pain, Dementia  are also affecting patient's functional outcome.   REHAB POTENTIAL: Good  CLINICAL DECISION MAKING: Evolving/moderate complexity  EVALUATION COMPLEXITY: Moderate   PLAN: PT FREQUENCY: 2x/week  PT DURATION: 8 weeks  PLANNED INTERVENTIONS: Therapeutic exercises, Therapeutic activity, Neuromuscular re-education, Balance training, Gait training, Patient/Family education, Joint manipulation, Joint mobilization, Stair training, Vestibular training, Canalith repositioning, Aquatic Therapy, Dry Needling, Electrical stimulation, Spinal manipulation, Spinal mobilization, Cryotherapy, Moist heat, Taping, Traction, Manual therapy, and Re-evaluation  PLAN FOR NEXT SESSION: Marye Round and Canalith repositioning as needed, vestibular rehab, balance   Juel Burrow, PT 08/12/2021, 9:41 AM   [facility] [address] [place], [place] [address] Phone # [phone] Fax [phone]

## 2021-08-16 ENCOUNTER — Ambulatory Visit: Payer: PPO | Admitting: Rehabilitative and Restorative Service Providers"

## 2021-08-16 ENCOUNTER — Encounter: Payer: Self-pay | Admitting: Rehabilitative and Restorative Service Providers"

## 2021-08-16 DIAGNOSIS — R42 Dizziness and giddiness: Secondary | ICD-10-CM | POA: Diagnosis not present

## 2021-08-16 DIAGNOSIS — R2689 Other abnormalities of gait and mobility: Secondary | ICD-10-CM

## 2021-08-16 NOTE — Therapy (Signed)
OUTPATIENT PHYSICAL THERAPY VESTIBULAR TREATMENT NOTE     Patient Name: Cheryl [name] MRN: [id number] DOB:02/06/1941, 81 y.o., female Today's Date: 08/16/2021  PCP: Hali Marry, MD  REFERRING PROVIDER: Hali Marry, MD    PT End of Session - 08/16/21 [phone]     Visit Number 3    Date for PT Re-Evaluation 09/24/21    Authorization Type [employer]    PT Start Time 0846    PT Stop Time 0925    PT Time Calculation (min) 39 min    Activity Tolerance Patient tolerated treatment well    Behavior During Therapy [facility] for tasks assessed/performed             Past Medical History:  Diagnosis Date   Anemia    Anxiety    d/t recent family issues   Arthritis    Bone spur    Carotid stenosis, bilateral 06/06/2019   Chronic back pain    stenosis and OA   Depression    takes Paxil daily   Diverticulosis    Dizzy    occasionally   Encephalitis    at 9 months ago    Fatty liver    has had 2 Hep shots and the final one is in June 18   GERD (gastroesophageal reflux disease)    takes Omeprazole daily   Hepatitis 2012   History of bronchitis    20-25 yrs ago   History of colon polyps    precancerous   History of shingles    Hyperlipidemia    takes Simvastatin daily   IBS (irritable bowel syndrome)    more on side of constipation-takes Miralax daily   Insomnia    takes Melatonin and Ambien nightly   Internal hemorrhoids    Joint pain    Joint swelling    Lung nodules    calcified    Migraine headache    migraines-thinks coming from neck.Last one1/28/18   Pneumonia    hx of-20 yrs ago-=-walking   Renal cyst    left   Uterine cancer ([place]) 1973   Past Surgical History:  Procedure Laterality Date   APPENDECTOMY  1966   cataracts     bilateral   COLONOSCOPY  09/04/2020   EUS  02/10/2011   Procedure: UPPER ENDOSCOPIC ULTRASOUND (EUS) RADIAL;  Surgeon: Owens Loffler, MD;  [facility]: [facility];  Service: Endoscopy;  Laterality: N/A;  radial  linear    LUMBAR FUSION  03/2016   L4-5, Dr. Ola Spurr.   [place]   ROTATOR CUFF REPAIR  2009   left   TONSILLECTOMY     UPPER GASTROINTESTINAL ENDOSCOPY     VAGINAL HYSTERECTOMY  1973   Patient Active Problem List   Diagnosis Date Noted   DDD (degenerative disc disease), cervical 03/01/2021   Stenosis of cervical spine with myelopathy ([place]) 03/01/2021   Dementia ([place]) 02/15/2021   Pharyngoesophageal dysphagia 12/14/2020   Osteopenia 10/23/2020   Abnormal glucose 08/13/2020   Chronic migraine without aura, with intractable migraine, so stated, with status migrainosus 07/03/2019   Carotid stenosis, bilateral 06/06/2019   Migraine headache 03/22/2019   Daily headache 03/22/2019   Myofascial pain 03/27/2018   Numbness of left foot 11/07/2017   Right foot pain 09/12/2017   Deviated septum 01/25/2017   ETD (Eustachian tube dysfunction), bilateral 01/25/2017   Seasonal allergic rhinitis 01/25/2017   Bilateral hearing loss 01/25/2017   S/P lumbar fusion 06/20/2016   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  Fatty liver 12/22/2015   Onychodystrophy 11/24/2015   Hyperlipidemia 12/17/2013   Osteoarthritis of left knee 07/05/2013   Obesity 03/10/2011   Dyspepsia 02/10/2011   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   PALPITATIONS 02/11/2010   OSTEOPENIA 11/27/2009   CONSTIPATION, CHRONIC 08/15/2008   Insomnia 07/08/2008   Chronic neck pain 04/11/2008   Disorder of kidney and ureter 01/21/2008   HEMORRHOIDS, INTERNAL 01/16/2008   Diverticulosis of colon 01/16/2008   POSTMENOPAUSAL STATUS 12/05/2007   MICROSCOPIC HEMATURIA 06/05/2007   STENOSIS, LUMBAR SPINE 12/22/2005   Anxiety and depression 11/15/2005   IRRITABLE BOWEL SYNDROME 11/15/2005   ARTHRITIS 11/15/2005    ONSET DATE: July 25, 2021 went to ED with dizziness  REFERRING DIAG: H81.10 (ICD-10-CM) - Benign paroxysmal positional vertigo, unspecified laterality   THERAPY DIAG:   Dizziness and giddiness  Balance problem  Rationale for Evaluation and Treatment Rehabilitation  SUBJECTIVE:   SUBJECTIVE STATEMENT: Pt reports that she has been sniffing peppermint and her nausea has gone away.  Pt reports in general, she is feeling 80% better, but does state that she is feeling worse today than she has in a while. Pt accompanied by: self  PERTINENT HISTORY: Dementia, Chronic Neck Pain   PAIN:  Are you having pain? No  PRECAUTIONS: Fall  WEIGHT BEARING RESTRICTIONS No  FALLS: Has patient fallen in last 6 months? No  LIVING ENVIRONMENT: Lives with: lives with their spouse Lives in: House/apartment Stairs: Yes: External: 1 steps; no rails at ramp currently, but getting them installed soon Has following equipment at home: Single point cane, Walker - 4 wheeled, bed side commode, and Grab bars  PLOF: Independent  PATIENT GOALS:  To not be dizzy  OBJECTIVE:   DIAGNOSTIC FINDINGS: n/a  COGNITION: Overall cognitive status:  Pt reports that she was doing well cognitively until the Pandemic hit and she started staying in too long, then she started having some word finding problems.  Pt now with diagnosis of dementia.   SENSATION: WFL  POSTURE: rounded shoulders and forward head   Cervical ROM:    Active A/PROM (deg) eval  Flexion 45  Extension 30  Right lateral flexion 30  Left lateral flexion 35  Right rotation   Left rotation   (Blank rows = not tested)  STRENGTH: WFL UE and LE   GAIT: Gait pattern: decreased step length- Right, decreased step length- Left, and decreased trunk rotation Distance walked: 100 ft Assistive device utilized: None Level of assistance: Complete Independence Comments: Following Epley Maneuver, pt reports that she felt more steady  FUNCTIONAL TESTs:  5 times sit to stand: To assess next visit Timed up and go (TUG): To assess next visit  PATIENT SURVEYS:  08/16/2021: [place] Total Score: 12 / 100 Physical Score:  0 / 28 Emotional Score: 8 / 36 Functional Score: 4 / 36  08/03/2021: [place] Total Score: 54 / 100 Physical Score: 8 / 28 Emotional Score: 26 / 36 Functional Score: 20 / 36  VESTIBULAR ASSESSMENT   GENERAL OBSERVATION: Pt is a pleasant female with noted decreased cervical motion noted during initial evaluation    SYMPTOM BEHAVIOR:   Subjective history: Pt reports that she has been having some dizziness for the past couple of years, but it got worse on 07/25/21   Non-Vestibular symptoms: neck pain, nausea/vomiting, and migraine symptoms   Type of dizziness: Spinning/Vertigo   Frequency: Pt reports that the dizziness has improved since 6/18, but she is still having some dizziness   Duration: depends on the  time   Aggravating factors: Induced by position change: rolling to the left and Induced by motion: occur when walking, sitting in a moving car, and activity in general   Relieving factors: head stationary, slow movements, and avoid busy/distracting environments   Progression of symptoms: better   OCULOMOTOR EXAM:   Ocular Alignment: normal   Ocular ROM: No Limitations   Spontaneous Nystagmus: absent   Gaze-Induced Nystagmus: absent   Smooth Pursuits: intact   Saccades: intact     VESTIBULAR - OCULAR REFLEX:    Positive head thrust test to bilateral sides for nystagmus    POSITIONAL TESTING:  08/03/2021: Right Dix-Hallpike: no nystagmus Left Dix-Hallpike: nystagmus noted and Duration: 15 sec    VESTIBULAR TREATMENT: 08/16/2021: [place] 12/100 Marye Round to left side:  no nystagmus noted, but pt did report some dizziness.  Proceeded with treatment with Epley Maneuver Walking down PT hallway performing vertical and horizontal head turns x2 laps each Tandem gait down ballet barre with cuing to decrease reliance on UE support x6 laps Standing on blue foam with static stance Standing on blue foam performing vertical and horizontal head turns x1 min each Standing on blue foam  performing gentle ball toss  Alt toe tap to 6" step without UE support x10  7/6/023: VOR for head nod and head rotation x1 min each with brief recovery periods Pencil push ups 2x10 Nestor Lewandowsky x2 reps bilat Epley Maneuver x3 reps, response to treatment:  symptoms improved.  Noted at least 20 secs of nystagmus on first [employer], 10 seconds on second L [employer], and no nystagmus on third L Marye Round  08/03/2021: Canalith Repositioning:   Epley Left: Number of Reps: 2 and Response to Treatment: symptoms improved  PATIENT EDUCATION: Education details: Provided with HEP printout Person educated: Patient Education method: Explanation and Handouts Education comprehension: verbalized understanding  HOME EXERCISE PROGRAM: Access Code: [email or web address] URL: [email or web address] Date: 08/12/2021 Prepared by: Juel Burrow  Exercises - Brandt-Daroff Vestibular Exercise  - 1 x daily - 7 x weekly - 1 sets - 5 reps - Seated Gaze Stabilization with Head Nod  - 1 x daily - 7 x weekly - 3 sets - 50 reps - Seated Gaze Stabilization with Head Rotation  - 1 x daily - 7 x weekly - 3 sets - 50 reps - Pencil Pushups  - 1 x daily - 7 x weekly - 2 sets - 10 reps  Patient Education - BPPV   GOALS: Goals reviewed with patient? Yes  SHORT TERM GOALS: Target date: 08/24/2021   Pt will be independent with initial HEP. Baseline: Goal status: MET on 08/16/2021  2.  Pt will report at least a 40% improvement in dizziness symptoms. Baseline:  Goal status: MET on 08/16/2021   LONG TERM GOALS: Target date: 09/28/2021    Pt will be independent with advanced HEP. Baseline:  Goal status: INITIAL  2.  Pt will improve Dizziness Handicapped Inventory sore to less than 30/100 to indicate decreased dizziness with tasks. Baseline: 54/100 Goal status: INITIAL  3.  Pt will demonstrate negative Marye Round consistently by last PT treatment session. Baseline:  Goal status: INITIAL  4.  Pt  will report at least an 80% improvement in dizziness during daily tasks and rolling over in bed. Baseline:  Goal status: INITIAL   ASSESSMENT:  CLINICAL IMPRESSION: Ms Witcher reports that she is starting to have a mild headache, but has not developed a migraine yet.  Pt reports that she is having  some neck pain with VOR 1x1.  Educated pt to decreased frequency of this to 2-3x/week and to add in slower head turn with ambulation down hallway at home instead.  Pt verbalized understanding.  Pt able to add in balance exercises during session today and initially had difficulty with static standing on blue foam.  Pt continues to require skilled PT to progress towards goal related activities.   OBJECTIVE IMPAIRMENTS Abnormal gait, decreased balance, and dizziness.   ACTIVITY LIMITATIONS locomotion level  PARTICIPATION LIMITATIONS: community activity  PERSONAL FACTORS Age, Time since onset of injury/illness/exacerbation, and 1-2 comorbidities: Chronic Neck Pain, Dementia  are also affecting patient's functional outcome.   REHAB POTENTIAL: Good  CLINICAL DECISION MAKING: Evolving/moderate complexity  EVALUATION COMPLEXITY: Moderate   PLAN: PT FREQUENCY: 2x/week  PT DURATION: 8 weeks  PLANNED INTERVENTIONS: Therapeutic exercises, Therapeutic activity, Neuromuscular re-education, Balance training, Gait training, Patient/Family education, Joint manipulation, Joint mobilization, Stair training, Vestibular training, Canalith repositioning, Aquatic Therapy, Dry Needling, Electrical stimulation, Spinal manipulation, Spinal mobilization, Cryotherapy, Moist heat, Taping, Traction, Manual therapy, and Re-evaluation  PLAN FOR NEXT SESSION: Marye Round and Canalith repositioning as needed, vestibular rehab, balance   Juel Burrow, PT 08/16/2021, 9:34 AM   [facility] [address], [address] [place], [place] [address] Phone # [phone] Fax [phone]

## 2021-08-17 NOTE — Telephone Encounter (Signed)
Received a fax from [facility]. Ubrelvy 100 mg patient assistance has been granted through 02/06/22. Pt will need to get this directly through the program and will need to contact them for refills 2 weeks in advance by calling [phone].

## 2021-08-19 ENCOUNTER — Ambulatory Visit: Payer: PPO | Admitting: Rehabilitative and Restorative Service Providers"

## 2021-08-20 ENCOUNTER — Encounter: Payer: Self-pay | Admitting: Family Medicine

## 2021-08-20 ENCOUNTER — Other Ambulatory Visit: Payer: Self-pay | Admitting: *Deleted

## 2021-08-20 ENCOUNTER — Other Ambulatory Visit: Payer: Self-pay | Admitting: Family Medicine

## 2021-08-20 DIAGNOSIS — R1314 Dysphagia, pharyngoesophageal phase: Secondary | ICD-10-CM

## 2021-08-20 DIAGNOSIS — R140 Abdominal distension (gaseous): Secondary | ICD-10-CM

## 2021-08-20 DIAGNOSIS — G3184 Mild cognitive impairment, so stated: Secondary | ICD-10-CM

## 2021-08-20 DIAGNOSIS — R197 Diarrhea, unspecified: Secondary | ICD-10-CM

## 2021-08-23 ENCOUNTER — Ambulatory Visit: Payer: PPO | Admitting: Rehabilitative and Restorative Service Providers"

## 2021-08-23 ENCOUNTER — Encounter: Payer: Self-pay | Admitting: Rehabilitative and Restorative Service Providers"

## 2021-08-23 DIAGNOSIS — R42 Dizziness and giddiness: Secondary | ICD-10-CM | POA: Diagnosis not present

## 2021-08-23 DIAGNOSIS — R2689 Other abnormalities of gait and mobility: Secondary | ICD-10-CM

## 2021-08-23 NOTE — Therapy (Signed)
OUTPATIENT PHYSICAL THERAPY VESTIBULAR TREATMENT NOTE     Patient Name: Cheryl [name] MRN: [id number] DOB:Jul 01, 1940, 81 y.o., female Today's Date: 08/23/2021  PCP: Hali Marry, MD  REFERRING PROVIDER: Hali Marry, MD    PT End of Session - 08/23/21 1019     Visit Number 4    Date for PT Re-Evaluation 09/24/21    Authorization Type [employer]    PT Start Time 1015    PT Stop Time 1055    PT Time Calculation (min) 40 min    Activity Tolerance Patient tolerated treatment well    Behavior During Therapy WFL for tasks assessed/performed             Past Medical History:  Diagnosis Date   Anemia    Anxiety    d/t recent family issues   Arthritis    Bone spur    Carotid stenosis, bilateral 06/06/2019   Chronic back pain    stenosis and OA   Depression    takes Paxil daily   Diverticulosis    Dizzy    occasionally   Encephalitis    at 9 months ago    Fatty liver    has had 2 Hep shots and the final one is in June 18   GERD (gastroesophageal reflux disease)    takes Omeprazole daily   Hepatitis 2012   History of bronchitis    20-25 yrs ago   History of colon polyps    precancerous   History of shingles    Hyperlipidemia    takes Simvastatin daily   IBS (irritable bowel syndrome)    more on side of constipation-takes Miralax daily   Insomnia    takes Melatonin and Ambien nightly   Internal hemorrhoids    Joint pain    Joint swelling    Lung nodules    calcified    Migraine headache    migraines-thinks coming from neck.Last one1/28/18   Pneumonia    hx of-20 yrs ago-=-walking   Renal cyst    left   Uterine cancer ([place]) 1973   Past Surgical History:  Procedure Laterality Date   APPENDECTOMY  1966   cataracts     bilateral   COLONOSCOPY  09/04/2020   EUS  02/10/2011   Procedure: UPPER ENDOSCOPIC ULTRASOUND (EUS) RADIAL;  Surgeon: Owens Loffler, MD;  Location: [facility];  Service: Endoscopy;  Laterality: N/A;  radial  linear    LUMBAR FUSION  03/2016   L4-5, Dr. Ola Spurr.   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   Dementia ([place]) 02/15/2021   Pharyngoesophageal dysphagia 12/14/2020   Osteopenia 10/23/2020   Abnormal glucose 08/13/2020   Chronic migraine without aura, with intractable migraine, so stated, with status migrainosus 07/03/2019   Carotid stenosis, bilateral 06/06/2019   Migraine headache 03/22/2019   Daily headache 03/22/2019   Myofascial pain 03/27/2018   Numbness of left foot 11/07/2017   Right foot pain 09/12/2017   Deviated septum 01/25/2017   ETD (Eustachian tube dysfunction), bilateral 01/25/2017   Seasonal allergic rhinitis 01/25/2017   Bilateral hearing loss 01/25/2017   S/P lumbar fusion 06/20/2016   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  Fatty liver 12/22/2015   Onychodystrophy 11/24/2015   Hyperlipidemia 12/17/2013   Osteoarthritis of left knee 07/05/2013   Obesity 03/10/2011   Dyspepsia 02/10/2011   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   PALPITATIONS 02/11/2010   OSTEOPENIA 11/27/2009   CONSTIPATION, CHRONIC 08/15/2008   Insomnia 07/08/2008   Chronic neck pain 04/11/2008   Disorder of kidney and ureter 01/21/2008   HEMORRHOIDS, INTERNAL 01/16/2008   Diverticulosis of colon 01/16/2008   POSTMENOPAUSAL STATUS 12/05/2007   MICROSCOPIC HEMATURIA 06/05/2007   STENOSIS, LUMBAR SPINE 12/22/2005   Anxiety and depression 11/15/2005   IRRITABLE BOWEL SYNDROME 11/15/2005   ARTHRITIS 11/15/2005    ONSET DATE: July 25, 2021 went to ED with dizziness  REFERRING DIAG: H81.10 (ICD-10-CM) - Benign paroxysmal positional vertigo, unspecified laterality   THERAPY DIAG:   Dizziness and giddiness  Balance problem  Rationale for Evaluation and Treatment Rehabilitation  SUBJECTIVE:   SUBJECTIVE STATEMENT: Pt reports that she has not had any dizziness, reports that the dizziness is 90% better. Pt accompanied by: self  PERTINENT HISTORY: Dementia, Chronic Neck Pain   PAIN:  Are you having pain? No and Yes: NPRS scale: 2/10 Pain location: back Pain description: aching Aggravating factors: varies Relieving factors: cold or hot pack  PRECAUTIONS: Fall  WEIGHT BEARING RESTRICTIONS No  FALLS: Has patient fallen in last 6 months? No  LIVING ENVIRONMENT: Lives with: lives with their spouse Lives in: House/apartment Stairs: Yes: External: 1 steps; no rails at ramp currently, but getting them installed soon Has following equipment at home: Single point cane, Walker - 4 wheeled, bed side commode, and Grab bars  PLOF: Independent  PATIENT GOALS:  To not be dizzy  OBJECTIVE:   DIAGNOSTIC FINDINGS: n/a  COGNITION: Overall cognitive status:  Pt reports that she was doing well cognitively until the Pandemic hit and she started staying in too long, then she started having some word finding problems.  Pt now with diagnosis of dementia.   SENSATION: WFL  POSTURE: rounded shoulders and forward head   Cervical ROM:    Active A/PROM (deg) eval  Flexion 45  Extension 30  Right lateral flexion 30  Left lateral flexion 35  Right rotation   Left rotation   (Blank rows = not tested)  STRENGTH: WFL UE and LE   GAIT: Gait pattern: decreased step length- Right, decreased step length- Left, and decreased trunk rotation Distance walked: 100 ft Assistive device utilized: None Level of assistance: Complete Independence Comments: Following Epley Maneuver, pt reports that she felt more steady  FUNCTIONAL TESTs:  5 times sit to stand: To assess next visit Timed up and go (TUG): To assess next visit  PATIENT SURVEYS:  08/16/2021: [place] Total Score:  12 / 100 Physical Score: 0 / 28 Emotional Score: 8 / 36 Functional Score: 4 / 36  08/03/2021: [facility] Total Score: 54 / 100 Physical Score: 8 / 28 Emotional Score: 26 / 36 Functional Score: 20 / 36  VESTIBULAR ASSESSMENT   GENERAL OBSERVATION: Pt is a pleasant female with noted decreased cervical motion noted during initial evaluation    SYMPTOM BEHAVIOR:   Subjective history: Pt reports that she has been having some dizziness for the past couple of years, but it got worse on 07/25/21   Non-Vestibular symptoms: neck pain, nausea/vomiting, and migraine symptoms   Type of dizziness: Spinning/Vertigo   Frequency: Pt reports that the dizziness has improved since 6/18, but she is still having some dizziness   Duration: depends on the time  Aggravating factors: Induced by position change: rolling to the left and Induced by motion: occur when walking, sitting in a moving car, and activity in general   Relieving factors: head stationary, slow movements, and avoid busy/distracting environments   Progression of symptoms: better   OCULOMOTOR EXAM:   Ocular Alignment: normal   Ocular ROM: No Limitations   Spontaneous Nystagmus: absent   Gaze-Induced Nystagmus: absent   Smooth Pursuits: intact   Saccades: intact     VESTIBULAR - OCULAR REFLEX:    Positive head thrust test to bilateral sides for nystagmus    POSITIONAL TESTING:  08/03/2021: Right Dix-Hallpike: no nystagmus Left Dix-Hallpike: nystagmus noted and Duration: 15 sec    VESTIBULAR TREATMENT: 08/23/2021: Alt toe tap to 6" step without UE support 2x10 Standing on blue foam with static stance Standing on blue foam performing vertical and horizontal head turns x1 min each Standing on blue foam performing head turns x1 min Standing on blue foam with eyes closed 2 x 30 sec with close SBA/occasional CGA Ambulation down PT hallway with horizontal and vertical head turns x2 laps each Side stepping while bouncing purple ball on wall  2x10' bilat Standing calf stretch on 2" step 3 x 20 sec Standing holding red wt ball outstretched performing trunk rotation 2x10  08/16/2021: [place] 12/100 Dix Hallpike to left side:  no nystagmus noted, but pt did report some dizziness.  Proceeded with treatment with Epley Maneuver Walking down PT hallway performing vertical and horizontal head turns x2 laps each Tandem gait down ballet barre with cuing to decrease reliance on UE support x6 laps Standing on blue foam with static stance Standing on blue foam performing vertical and horizontal head turns x1 min each Standing on blue foam performing gentle ball toss  Alt toe tap to 6" step without UE support x10  7/6/023: VOR for head nod and head rotation x1 min each with brief recovery periods Pencil push ups 2x10 Nestor Lewandowsky x2 reps bilat Epley Maneuver x3 reps, response to treatment:  symptoms improved.  Noted at least 20 secs of nystagmus on first [employer], 10 seconds on second L [employer], and no nystagmus on third L [employer]   PATIENT EDUCATION: Education details: Provided with HEP printout Person educated: Patient Education method: Theatre stage manager Education comprehension: verbalized understanding  HOME EXERCISE PROGRAM: Access Code: [email or web address] URL: [email or web address] Date: 08/12/2021 Prepared by: Juel Burrow  Exercises - Brandt-Daroff Vestibular Exercise  - 1 x daily - 7 x weekly - 1 sets - 5 reps - Seated Gaze Stabilization with Head Nod  - 1 x daily - 7 x weekly - 3 sets - 50 reps - Seated Gaze Stabilization with Head Rotation  - 1 x daily - 7 x weekly - 3 sets - 50 reps - Pencil Pushups  - 1 x daily - 7 x weekly - 2 sets - 10 reps  Patient Education - BPPV   GOALS: Goals reviewed with patient? Yes  SHORT TERM GOALS: Target date: 08/24/2021   Pt will be independent with initial HEP. Baseline: Goal status: MET on 08/16/2021  2.  Pt will report at least a 40% improvement in  dizziness symptoms. Baseline:  Goal status: MET on 08/16/2021   LONG TERM GOALS: Target date: 09/28/2021    Pt will be independent with advanced HEP. Baseline:  Goal status: INITIAL  2.  Pt will improve Dizziness Handicapped Inventory sore to less than 30/100 to indicate decreased dizziness with tasks. Baseline: 54/100 Goal status:  MET on 08/16/21  3.  Pt will demonstrate negative Marye Round consistently by last PT treatment session. Baseline:  Goal status: IN PROGRESS  4.  Pt will report at least an 80% improvement in dizziness during daily tasks and rolling over in bed. Baseline:  Goal status: MET on 08/23/21   ASSESSMENT:  CLINICAL IMPRESSION: Ms Kneebone reports to skilled PT reporting much improvement in her dizziness with feeling approx 90% better since initial evaluation.  Pt denies dizziness when rolling over in bed.  Pt able to maintain static balance on blue foam with less assist required.  Pt continues to progress with balance and vestibular tasks.  Pt continues to progress towards goal related activities.   OBJECTIVE IMPAIRMENTS Abnormal gait, decreased balance, and dizziness.   ACTIVITY LIMITATIONS locomotion level  PARTICIPATION LIMITATIONS: community activity  PERSONAL FACTORS Age, Time since onset of injury/illness/exacerbation, and 1-2 comorbidities: Chronic Neck Pain, Dementia  are also affecting patient's functional outcome.   REHAB POTENTIAL: Good  CLINICAL DECISION MAKING: Evolving/moderate complexity  EVALUATION COMPLEXITY: Moderate   PLAN: PT FREQUENCY: 2x/week  PT DURATION: 8 weeks  PLANNED INTERVENTIONS: Therapeutic exercises, Therapeutic activity, Neuromuscular re-education, Balance training, Gait training, Patient/Family education, Joint manipulation, Joint mobilization, Stair training, Vestibular training, Canalith repositioning, Aquatic Therapy, Dry Needling, Electrical stimulation, Spinal manipulation, Spinal mobilization, Cryotherapy,  Moist heat, Taping, Traction, Manual therapy, and Re-evaluation  PLAN FOR NEXT SESSION: Marye Round and Canalith repositioning as needed, vestibular rehab, balance   Juel Burrow, PT 08/23/2021, 11:01 AM   [facility] [address] [place], [place] [address] Phone # [phone] Fax [phone]

## 2021-08-23 NOTE — Progress Notes (Unsigned)
PATIENT: Cheryl [name] DOB: 1940/11/15  REASON FOR VISIT: follow up HISTORY FROM: patient  No chief complaint on file.    HISTORY OF PRESENT ILLNESS: Today 08/23/21:  03/01/21 (copied from Dr. Cathren Laine note): 03/01/2021: Patient Is here for folow up of headaches. She was doing extremely well on Ajovy even when we saw her 08/24/2020 but now states it isn;t working anymore and stopped it last month. She had an MRI cervical spine earlier this year 02/17/2020 as below. NOW she says the Ajovy never really worked. She says it is her neck and shoulders that is causing th headaches. She feels the ablation a year ago of the neck really helped it was about 6 months headache free, and she went back back in September of 2022 and had another ablation an that was doing great until she fell around October 10th and since then the headaches have returned, not as bad but the Ajovy wasn't woring (maybe it was the ablation she thinks). A lot of neck pain, no shooting pain down the arms and no weakness, she has a problem with balance, she has been in PT, she had that fall in October. She wants to go back and see Dr. Vivien Rota. She has 6 migraine days a month and < 14 (headache+migraine total a month)    Patient complains of symptoms per HPI as well as the following symptoms: worsening neckpain . Pertinent negatives and positives per HPI. All others negative     Reviewed images and agree, reviewed with patient   Disc levels: Multilevel desiccation and disc space loss most prominent at the C5-7 level.   C2-3: No significant disc bulge. Patent spinal canal and neural foramen.   C3-4: Disc osteophyte complex with superimposed left foraminal protrusion, uncovertebral and facet hypertrophy. Patent spinal canal and right neural foramen. Moderate left neural foraminal narrowing.   C4-5: Disc osteophyte complex with superimposed left subarticular protrusion. Left predominant uncovertebral and facet hypertrophy. Mild  spinal canal, mild right and moderate left neural foraminal narrowing.   C5-6: Disc osteophyte complex abutting the ventral cord with uncovertebral and facet hypertrophy. Moderate spinal canal, moderate right and mild left neural foraminal narrowing.   C6-7: Disc osteophyte complex with superimposed bilateral foraminal protrusions. Uncovertebral and facet hypertrophy. Mild spinal canal, mild right and moderate left neural foraminal narrowing. 3 mm right perineural cyst, unchanged.   C7-T1: No significant disc bulge. Uncovertebral and facet degenerative spurring. Patent spinal canal and left neural foramen. Mild right neural foraminal narrowing. Bilateral perineural cyst measuring 4 mm.   Paraspinal tissues: Negative.   IMPRESSION: Multilevel spondylosis, progressed since prior exam.   Moderate C5-6 and mild C4-5, C6-7 spinal canal narrowing.   Moderate left C3-5, right C5-6 and left C6-7 neural foraminal narrowing.   Multilevel sub 5-mm perineural cyst as detailed above   08/24/20:Cheryl [name] is a 81 year old female with a history of cervicogenic headaches and migraine headaches.  She returns today for follow-up.  She reports that since the beginning of the year she has had very few headaches.  She reports a mild headache last week but typically she can use her rice pack on her neck and her headache resolves within 30 minutes or so.  She states when she was taking Mobic she rarely had a headache.  But it caused hair loss so she is no longer taking this medication   10/16/19: Cheryl [name] is a 81 year old female with a history of cervicogenic headaches and migraine headaches.  She reports that  overall she has been doing well.  She reports that since starting Ajovy she will go some weeks with no headaches at all.  Previously she was having daily headaches.  She does report on occasion she will wake up with headaches.  She states that the headaches are typically very mild.  She does report  photophobia but denies nausea or vomiting.  She is unsure if she snores.  Denies excessive daytime fatigue.  Overall she feels that she has improved since her visit with Dr. Jaynee Eagles  HISTORY Cheryl [name] is a 81 y.o. female here as requested by [name] for migraines. I reviewed [name]'s notes: Patient was being seen by [facility] neurosurgery and spine for cervicogenic headache, neck pain, she was last seen September 13, 2019 for bilateral C3 C2-C5 medial branch block.  She has chronic cervical pain with cervicogenic headache as well as chronic lumbar pain previously treated with tramadol, she is tried various medications for headache management such as nortriptyline and amitriptyline, amitriptyline was initially effective but then stopped, when she was seen for follow-up April 20 she had excellent response to the medial branch block and 80 to 85% improvement in her neck pain, little pain in the neck itself unfortunately she continued to describe headache daily, headache symptoms are very limiting, making it difficult for her to be active as well as difficult for her to sleep, no new symptoms or falls.  I was also able to review Dr. Fay Records notes pertaining to her headaches: Patient does have a history of migraines, she felt like they are coming from her neck, a lot of tension and tightness in her neck and a decrease in rotation to the left, heat in the morning helps, inevitably during the day her headache starts in the last until she goes to bed, some days it is more severe than others, she tried needling which caused more pain, she is tried over-the-counter medications for the neck pain as well, headaches worsening since quarantine over a year, no visual changes, she was told she could not use medications for her migraines due to "white spots" on her brain, headaches can move around and can be on the top of the head and the side of the head or the back of the head, sometimes behind the eyes,  usually more on one side.  She is tried amitriptyline as well as Topamax and multiple other medications.   She has been diagnosed with migraines for about 40 years, they slowly progressed and now they are debilitating, she can feel it coming in the back or the sides of her head most of the time in the temples, can be anytime, in the temples, she tried bote guards, it is throbbing, phono/photophobia, nausea, movement makes it worse, no vomiting, can last for up to a week off and on but one headache can last 24 hours. Daily headaches, 15 migraine days a month that are moderately severe to severe. No aura, no weakness, no numbness or tingling or weakness, heat may help a little on the neck otherwise nothing over the counter really helps she takes excedrin migraine, no known family history, no head trauma or inciting events, encephalitis as a child. Ongoing at thie frequency and severity and quality for years.    Reviewed notes, labs and imaging from outside physicians, which showed:     MRI brain 05/13/2017: IMPRESSION: No acute abnormality. Progressive atrophy and chronic white matter changes since 2012.   Amitriptyline, gabapentin, propranolol, paxil, tramadol,  trazodone    REVIEW OF SYSTEMS: Out of a complete 14 system review of symptoms, the patient complains only of the following symptoms, and all other reviewed systems are negative.  See HPI  ALLERGIES: Allergies  Allergen Reactions   Celebrex [Celecoxib] Itching, Nausea Only and Other (See Comments)    Gas, hair loss also   Fluoxetine Hcl Shortness Of Breath and Itching   Ambien [Zolpidem] Other (See Comments)    Headaches    Amitriptyline Other (See Comments)    Dry mouth    Contrast Media [Iodinated Contrast Media] Itching and Other (See Comments)    MultiHance (probably not "Multilance")   Dexamethasone Diarrhea and Nausea And Vomiting   Livalo [Pitavastatin] Other (See Comments)    Myalgia    Metrizamide Itching and Other (See  Comments)    AMIPAQUE (also allergic to MultiHance (probably not "Multilance")   Nitroglycerin Nausea Only and Other (See Comments)    Dizziness and sweating, also   Rosuvastatin Other (See Comments)    Body aches and cramping   Simvastatin Other (See Comments)    Muscle aches and headaches   Statins Other (See Comments)    Could NOT tolerate- "Made me feel like I had the flu"   Codeine Sulfate Itching   Sulfonamide Derivatives Nausea Only    HOME MEDICATIONS: Outpatient Medications Prior to Visit  Medication Sig Dispense Refill   aspirin 81 MG tablet Take 81 mg by mouth daily.     Cholecalciferol (VITAMIN D) 1000 UNITS capsule Take 2,000 Units by mouth daily.     cycloSPORINE (RESTASIS) 0.05 % ophthalmic emulsion Place 1 drop into both eyes 2 (two) times daily. (Patient taking differently: Place 1 drop into both eyes in the morning and at bedtime.) 5.5 mL 5   donepezil (ARICEPT) 10 MG tablet Take 1 tablet (10 mg total) by mouth at bedtime. Please start after completing the 5 mg dose for 4 weeks. (Patient not taking: Reported on 08/03/2021) 90 tablet 1   EXCEDRIN MIGRAINE 250-250-65 MG tablet Take 2 tablets by mouth every 6 (six) hours as needed for migraine or headache.     fluticasone (FLONASE) 50 MCG/ACT nasal spray INSTILL 2 SPRAYS INTO EACH NOSTRIL EVERY DAY 48 mL 3   hydrOXYzine (VISTARIL) 25 MG capsule TAKE 1 CAPSULE BY MOUTH 3 TIMES DAILY AS NEEDED FOR ITCHING. (Patient taking differently: Take 25 mg by mouth 3 (three) times daily as needed for itching.) 270 capsule 0   MAGNESIUM PO Take 1 tablet by mouth daily.      meclizine (ANTIVERT) 25 MG tablet Take 1 tablet (25 mg total) by mouth 3 (three) times daily as needed for dizziness. (Patient not taking: Reported on 08/03/2021) 30 tablet 0   Omega-3 Fatty Acids (FISH OIL) 1000 MG CAPS Take 1 capsule (1,000 mg total) by mouth daily. (Patient taking differently: Take 2,000 mg by mouth daily.) 90 capsule 3   omeprazole (PRILOSEC) 20 MG  capsule TAKE 1 CAPSULE TWICE DAILY BEFORE A MEAL (Patient taking differently: Take 20 mg by mouth daily before breakfast.) 180 capsule 3   PARoxetine (PAXIL) 40 MG tablet TAKE 1 TABLET BY MOUTH EVERY DAY (Patient taking differently: Take 40 mg by mouth daily.) 90 tablet 1   polyethylene glycol (MIRALAX / GLYCOLAX) packet Take 17 g by mouth daily.     Probiotic Product (PROBIOTIC PO) Take 1 capsule by mouth daily. (Patient not taking: Reported on 08/03/2021)     REPATHA SURECLICK 332 MG/ML SOAJ INJECT 140 MG  INTO THE SKIN EVERY 14 (FOURTEEN) DAYS. 6 mL 3   traMADol (ULTRAM) 50 MG tablet Take 2 tablets (100 mg total) by mouth daily as needed. (Patient taking differently: Take 100 mg by mouth daily as needed (for pain).) 180 tablet 1   Ubrogepant (UBRELVY) 100 MG TABS Take 100 mg by mouth every 2 (two) hours as needed. Maximum '200mg'$  a day. (Patient taking differently: Take 100 mg by mouth every 2 (two) hours as needed (for migraines- not to exceed 200 mg/24 hours).) 16 tablet 11   No facility-administered medications prior to visit.    PAST MEDICAL HISTORY: Past Medical History:  Diagnosis Date   Anemia    Anxiety    d/t recent family issues   Arthritis    Bone spur    Carotid stenosis, bilateral 06/06/2019   Chronic back pain    stenosis and OA   Depression    takes Paxil daily   Diverticulosis    Dizzy    occasionally   Encephalitis    at 9 months ago    Fatty liver    has had 2 Hep shots and the final one is in June 18   GERD (gastroesophageal reflux disease)    takes Omeprazole daily   Hepatitis 2012   History of bronchitis    20-25 yrs ago   History of colon polyps    precancerous   History of shingles    Hyperlipidemia    takes Simvastatin daily   IBS (irritable bowel syndrome)    more on side of constipation-takes Miralax daily   Insomnia    takes Melatonin and Ambien nightly   Internal hemorrhoids    Joint pain    Joint swelling    Lung nodules    calcified     Migraine headache    migraines-thinks coming from neck.Last one1/28/18   Pneumonia    hx of-20 yrs ago-=-walking   Renal cyst    left   Uterine cancer ([place]) 1973    PAST SURGICAL HISTORY: Past Surgical History:  Procedure Laterality Date   APPENDECTOMY  1966   cataracts     bilateral   COLONOSCOPY  09/04/2020   EUS  02/10/2011   Procedure: UPPER ENDOSCOPIC ULTRASOUND (EUS) RADIAL;  Surgeon: Owens Loffler, MD;  Location: [facility] ENDOS[facility];  Service: Endoscopy;  Laterality: N/A;  radial linear    LUMBAR FUSION  03/2016   L4-5, Dr. Ola Spurr.   Dent   ROTATOR CUFF REPAIR  2009   left   TONSILLECTOMY     UPPER GASTROINTESTINAL ENDOSCOPY     VAGINAL HYSTERECTOMY  1973    FAMILY HISTORY: Family History  Problem Relation Age of Onset   Kidney disease Mother    Kidney failure Mother        med induced   Rheum arthritis Mother    Heart disease Father    Alcohol abuse Father    Lung cancer Father        smoker   Depression Brother    Crohn's disease Brother    Alcohol abuse Brother    Heart failure Daughter    Lung cancer Son    HIV Son    Heart disease Other        Paternal family    Malignant hyperthermia Neg Hx    Colon cancer Neg Hx    Esophageal cancer Neg Hx    Rectal cancer Neg Hx    Stomach cancer Neg Hx  Migraines Neg Hx     SOCIAL HISTORY: Social History   Socioeconomic History   Marital status: Divorced    Spouse name: Not on file   Number of children: 5   Years of education: Not on file   Highest education level: Not on file  Occupational History   Occupation: Retired      Fish farm manager: RETIRED  Tobacco Use   Smoking status: Former    Types: Cigarettes    Quit date: 02/07/1993    Years since quitting: 28.5   Smokeless tobacco: Never  Vaping Use   Vaping Use: Never used  Substance and Sexual Activity   Alcohol use: No   Drug use: No   Sexual activity: Not on file  Other Topics Concern   Not on file  Social History Narrative    Currently doing PT once a week. Caffeine- ginger ale occ.                         Social Determinants of Health   Financial Resource Strain: Not on file  Food Insecurity: Not on file  Transportation Needs: Not on file  Physical Activity: Not on file  Stress: Not on file  Social Connections: Not on file  Intimate Partner Violence: Not on file      PHYSICAL EXAM  There were no vitals filed for this visit.  There is no height or weight on file to calculate BMI.  Generalized: Well developed, in no acute distress   Neurological examination  Mentation: Alert oriented to time, place, history taking. Follows all commands speech and language fluent Cranial nerve II-XII: Pupils were equal round reactive to light. Extraocular movements were full, visual field were full on confrontational test. Head turning and shoulder shrug  were normal and symmetric. Motor: The motor testing reveals 5 over 5 strength of all 4 extremities. Good symmetric motor tone is noted throughout.  Sensory: Sensory testing is intact to soft touch on all 4 extremities. No evidence of extinction is noted.  Coordination: Cerebellar testing reveals good finger-nose-finger and heel-to-shin bilaterally.  Gait and station: Gait is normal. Reflexes: Deep tendon reflexes are symmetric and normal bilaterally.   DIAGNOSTIC DATA (LABS, IMAGING, TESTING) - I reviewed patient records, labs, notes, testing and imaging myself where available.  Lab Results  Component Value Date   WBC 7.8 07/25/2021   HGB 14.7 07/25/2021   HCT 44.3 07/25/2021   MCV 87.4 07/25/2021   PLT 255 07/25/2021      Component Value Date/Time   NA 140 07/25/2021 1111   NA 138 06/11/2021 1113   K 3.6 07/25/2021 1111   CL 107 07/25/2021 1111   CO2 24 07/25/2021 1111   GLUCOSE 142 (H) 07/25/2021 1111   BUN 15 07/25/2021 1111   BUN 14 06/11/2021 1113   CREATININE 0.85 07/25/2021 1111   CREATININE 0.84 02/15/2021 1159   CALCIUM 9.6  07/25/2021 1111   PROT 7.3 07/25/2021 1111   PROT 6.8 06/11/2021 1113   ALBUMIN 4.0 07/25/2021 1111   ALBUMIN 4.2 06/11/2021 1113   AST 35 07/25/2021 1111   ALT 37 07/25/2021 1111   ALKPHOS 78 07/25/2021 1111   BILITOT 0.6 07/25/2021 1111   BILITOT 0.3 06/11/2021 1113   GFRNONAA >60 07/25/2021 1111   GFRNONAA 60 01/01/2020 1033   GFRAA 70 01/01/2020 1033   Lab Results  Component Value Date   CHOL 146 06/14/2021   HDL 42 06/14/2021   LDLCALC 72 06/14/2021  TRIG 189 (H) 06/14/2021   CHOLHDL 3.5 06/14/2021   Lab Results  Component Value Date   HGBA1C 5.6 02/15/2021   Lab Results  Component Value Date   VITAMINB12 550 02/15/2021   Lab Results  Component Value Date   TSH 2.94 02/15/2021      ASSESSMENT AND PLAN 81 y.o. year old female  has a past medical history of Anemia, Anxiety, Arthritis, Bone spur, Carotid stenosis, bilateral (06/06/2019), Chronic back pain, Depression, Diverticulosis, Dizzy, Encephalitis, Fatty liver, GERD (gastroesophageal reflux disease), Hepatitis (2012), History of bronchitis, History of colon polyps, History of shingles, Hyperlipidemia, IBS (irritable bowel syndrome), Insomnia, Internal hemorrhoids, Joint pain, Joint swelling, Lung nodules, Migraine headache, Pneumonia, Renal cyst, and Uterine cancer ([place]) (1973). here with:  1.  Migraine headaches  Continue Ajovy monthly injections Encourage the patient to keep a headache journal Follow-up in 6 months or sooner if needed    [name], MSN, NP-C 08/23/2021, 9:12 AM [facility] [address], [place], [place] [address] [phone]

## 2021-08-24 ENCOUNTER — Ambulatory Visit: Payer: PPO | Admitting: Adult Health

## 2021-08-24 ENCOUNTER — Encounter: Payer: Self-pay | Admitting: Adult Health

## 2021-08-24 VITALS — BP 102/69 | HR 80 | Ht 65.0 in | Wt 179.2 lb

## 2021-08-24 DIAGNOSIS — G4486 Cervicogenic headache: Secondary | ICD-10-CM | POA: Diagnosis not present

## 2021-08-24 DIAGNOSIS — M542 Cervicalgia: Secondary | ICD-10-CM

## 2021-08-24 DIAGNOSIS — G8929 Other chronic pain: Secondary | ICD-10-CM | POA: Diagnosis not present

## 2021-08-24 DIAGNOSIS — G43009 Migraine without aura, not intractable, without status migrainosus: Secondary | ICD-10-CM | POA: Diagnosis not present

## 2021-08-24 NOTE — Patient Instructions (Signed)
Your Plan:  Follow-up with Dr. Vivien Rota  If your symptoms worsen or you develop new symptoms please let us know.   Thank you for coming to see [name] at [facility]. I hope we have been able to provide you high quality care today.  You may receive a patient satisfaction survey over the next few weeks. We would appreciate your feedback and comments so that we may continue to improve ourselves and the health of our patients.

## 2021-08-26 ENCOUNTER — Ambulatory Visit: Payer: PPO | Admitting: Rehabilitative and Restorative Service Providers"

## 2021-08-26 ENCOUNTER — Encounter: Payer: Self-pay | Admitting: Rehabilitative and Restorative Service Providers"

## 2021-08-26 DIAGNOSIS — R2689 Other abnormalities of gait and mobility: Secondary | ICD-10-CM

## 2021-08-26 DIAGNOSIS — R42 Dizziness and giddiness: Secondary | ICD-10-CM | POA: Diagnosis not present

## 2021-08-26 NOTE — Therapy (Signed)
OUTPATIENT PHYSICAL THERAPY VESTIBULAR TREATMENT NOTE AND DISCHARGE SUMMARY     Patient Name: Cheryl [name] MRN: [id number] DOB:06-24-1940, 81 y.o., female Today's Date: 08/26/2021  PCP: Hali Marry, MD  REFERRING PROVIDER: Hali Marry, MD    PT End of Session - 08/26/21 [phone]     Visit Number 5    Date for PT Re-Evaluation 09/24/21    Authorization Type [employer]    PT Start Time 0850    PT Stop Time 0930    PT Time Calculation (min) 40 min    Activity Tolerance Patient tolerated treatment well    Behavior During Therapy [facility] for tasks assessed/performed             Past Medical History:  Diagnosis Date   Anemia    Anxiety    d/t recent family issues   Arthritis    Bone spur    Carotid stenosis, bilateral 06/06/2019   Chronic back pain    stenosis and OA   Depression    takes Paxil daily   Diverticulosis    Dizzy    occasionally   Encephalitis    at 9 months ago    Fatty liver    has had 2 Hep shots and the final one is in June 18   GERD (gastroesophageal reflux disease)    takes Omeprazole daily   Hepatitis 2012   History of bronchitis    20-25 yrs ago   History of colon polyps    precancerous   History of shingles    Hyperlipidemia    takes Simvastatin daily   IBS (irritable bowel syndrome)    more on side of constipation-takes Miralax daily   Insomnia    takes Melatonin and Ambien nightly   Internal hemorrhoids    Joint pain    Joint swelling    Lung nodules    calcified    Migraine headache    migraines-thinks coming from neck.Last one1/28/18   Pneumonia    hx of-20 yrs ago-=-walking   Renal cyst    left   Uterine cancer ([place]) 1973   Past Surgical History:  Procedure Laterality Date   APPENDECTOMY  1966   cataracts     bilateral   COLONOSCOPY  09/04/2020   EUS  02/10/2011   Procedure: UPPER ENDOSCOPIC ULTRASOUND (EUS) RADIAL;  Surgeon: Owens Loffler, MD;  Location: [facility] END[facility];  Service: Endoscopy;   Laterality: N/A;  radial linear    LUMBAR FUSION  03/2016   L4-5, Dr. Ola Spurr.   [place]   ROTATOR CUFF REPAIR  2009   left   TONSILLECTOMY     UPPER GASTROINTESTINAL ENDOSCOPY     VAGINAL HYSTERECTOMY  1973   Patient Active Problem List   Diagnosis Date Noted   DDD (degenerative disc disease), cervical 03/01/2021   Stenosis of cervical spine with myelopathy ([place]) 03/01/2021   Dementia ([place]) 02/15/2021   Pharyngoesophageal dysphagia 12/14/2020   Osteopenia 10/23/2020   Abnormal glucose 08/13/2020   Chronic migraine without aura, with intractable migraine, so stated, with status migrainosus 07/03/2019   Carotid stenosis, bilateral 06/06/2019   Migraine headache 03/22/2019   Daily headache 03/22/2019   Myofascial pain 03/27/2018   Numbness of left foot 11/07/2017   Right foot pain 09/12/2017   Deviated septum 01/25/2017   ETD (Eustachian tube dysfunction), bilateral 01/25/2017   Seasonal allergic rhinitis 01/25/2017   Bilateral hearing loss 01/25/2017   S/P lumbar fusion 06/20/2016   Lumbar stenosis with  neurogenic claudication 03/15/2016   Fatty liver 12/22/2015   Onychodystrophy 11/24/2015   Hyperlipidemia 12/17/2013   Osteoarthritis of left knee 07/05/2013   Obesity 03/10/2011   Dyspepsia 02/10/2011   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   PALPITATIONS 02/11/2010   OSTEOPENIA 11/27/2009   CONSTIPATION, CHRONIC 08/15/2008   Insomnia 07/08/2008   Chronic neck pain 04/11/2008   Disorder of kidney and ureter 01/21/2008   HEMORRHOIDS, INTERNAL 01/16/2008   Diverticulosis of colon 01/16/2008   POSTMENOPAUSAL STATUS 12/05/2007   MICROSCOPIC HEMATURIA 06/05/2007   STENOSIS, LUMBAR SPINE 12/22/2005   Anxiety and depression 11/15/2005   IRRITABLE BOWEL SYNDROME 11/15/2005   ARTHRITIS 11/15/2005    ONSET DATE: July 25, 2021 went to ED with dizziness  REFERRING DIAG: H81.10 (ICD-10-CM) - Benign paroxysmal positional vertigo, unspecified laterality    THERAPY DIAG:  Dizziness and giddiness  Balance problem  Rationale for Evaluation and Treatment Rehabilitation  SUBJECTIVE:   SUBJECTIVE STATEMENT: Pt reports that does not have dizziness any longer, just nausea from her pain medication. Pt accompanied by: self  PERTINENT HISTORY: Dementia, Chronic Neck Pain   PAIN:  Are you having pain? No and Yes: NPRS scale: 2/10 Pain location: back Pain description: aching Aggravating factors: varies Relieving factors: cold or hot pack  PRECAUTIONS: Fall  WEIGHT BEARING RESTRICTIONS No  FALLS: Has patient fallen in last 6 months? No  LIVING ENVIRONMENT: Lives with: lives with their spouse Lives in: House/apartment Stairs: Yes: External: 1 steps; no rails at ramp currently, but getting them installed soon Has following equipment at home: Single point cane, Walker - 4 wheeled, bed side commode, and Grab bars  PLOF: Independent  PATIENT GOALS:  To not be dizzy  OBJECTIVE:   DIAGNOSTIC FINDINGS: n/a  COGNITION: Overall cognitive status:  Pt reports that she was doing well cognitively until the Pandemic hit and she started staying in too long, then she started having some word finding problems.  Pt now with diagnosis of dementia.   SENSATION: WFL  POSTURE: rounded shoulders and forward head   Cervical ROM:    Active A/PROM (deg) eval  Flexion 45  Extension 30  Right lateral flexion 30  Left lateral flexion 35  Right rotation   Left rotation   (Blank rows = not tested)  STRENGTH: WFL UE and LE   GAIT: Gait pattern: decreased step length- Right, decreased step length- Left, and decreased trunk rotation Distance walked: 100 ft Assistive device utilized: None Level of assistance: Complete Independence Comments: Following Epley Maneuver, pt reports that she felt more steady  FUNCTIONAL TESTs:  5 times sit to stand: To assess next visit Timed up and go (TUG): To assess next visit  PATIENT SURVEYS:   08/16/2021: [place] Total Score: 12 / 100 Physical Score: 0 / 28 Emotional Score: 8 / 36 Functional Score: 4 / 36  08/03/2021: DHI Total Score: 54 / 100 Physical Score: 8 / 28 Emotional Score: 26 / 36 Functional Score: 20 / 36  VESTIBULAR ASSESSMENT   GENERAL OBSERVATION: Pt is a pleasant female with noted decreased cervical motion noted during initial evaluation    SYMPTOM BEHAVIOR:   Subjective history: Pt reports that she has been having some dizziness for the past couple of years, but it got worse on 07/25/21   Non-Vestibular symptoms: neck pain, nausea/vomiting, and migraine symptoms   Type of dizziness: Spinning/Vertigo   Frequency: Pt reports that the dizziness has improved since 6/18, but she is still having some dizziness   Duration: depends on  the time   Aggravating factors: Induced by position change: rolling to the left and Induced by motion: occur when walking, sitting in a moving car, and activity in general   Relieving factors: head stationary, slow movements, and avoid busy/distracting environments   Progression of symptoms: better   OCULOMOTOR EXAM:   Ocular Alignment: normal   Ocular ROM: No Limitations   Spontaneous Nystagmus: absent   Gaze-Induced Nystagmus: absent   Smooth Pursuits: intact   Saccades: intact     VESTIBULAR - OCULAR REFLEX:    Positive head thrust test to bilateral sides for nystagmus    POSITIONAL TESTING:  08/03/2021: Right Dix-Hallpike: no nystagmus Left Dix-Hallpike: nystagmus noted and Duration: 15 sec    VESTIBULAR TREATMENT: 08/26/2021: Marye Round negative on bilateral side Tandem gait with cuing to not hold onto railing Standing calf stretch on 2" step 3 x 20 sec Ambulation down PT hallway with horizontal and vertical head turns x2 laps each Standing on blue foam performing vertical and horizontal head turns x1 min each Standing on blue foam with eyes closed 2x30 sec  08/23/2021: Alt toe tap to 6" step without UE support  2x10 Standing on blue foam with static stance Standing on blue foam performing vertical and horizontal head turns x1 min each Standing on blue foam performing head turns x1 min Standing on blue foam with eyes closed 2 x 30 sec with close SBA/occasional CGA Ambulation down PT hallway with horizontal and vertical head turns x2 laps each Side stepping while bouncing purple ball on wall 2x10' bilat Standing calf stretch on 2" step 3 x 20 sec Standing holding red wt ball outstretched performing trunk rotation 2x10  08/16/2021: [place] 12/100 Dix Hallpike to left side:  no nystagmus noted, but pt did report some dizziness.  Proceeded with treatment with Epley Maneuver Walking down PT hallway performing vertical and horizontal head turns x2 laps each Tandem gait down ballet barre with cuing to decrease reliance on UE support x6 laps Standing on blue foam with static stance Standing on blue foam performing vertical and horizontal head turns x1 min each Standing on blue foam performing gentle ball toss  Alt toe tap to 6" step without UE support x10   PATIENT EDUCATION: Education details: Provided with HEP printout Person educated: Patient Education method: Explanation and Handouts Education comprehension: verbalized understanding  HOME EXERCISE PROGRAM: Access Code: [email or web address] URL: [email or web address] Date: 08/26/2021 Prepared by: Shelby Dubin Kmari Brian  Exercises - Standing Gastroc Stretch on Step with Counter Support  - 1 x daily - 7 x weekly - 1 sets - 2 reps - 20 sec hold - Walking Tandem Stance  - 1 x daily - 7 x weekly - 3 sets - 10 reps - Walking with Head Nod  - 1 x daily - 7 x weekly - 3 sets - 10 reps - Walking with Head Rotation  - 1 x daily - 7 x weekly - 3 sets - 10 reps - Brandt-Daroff Vestibular Exercise  - 1 x daily - 7 x weekly - 1 sets - 5 reps - Pencil Pushups  - 1 x daily - 7 x weekly - 2 sets - 10 reps - Seated Gaze Stabilization with Head Nod  - 1 x daily - 7 x  weekly - 3 sets - 50 reps - Seated Gaze Stabilization with Head Rotation  - 1 x daily - 7 x weekly - 3 sets - 50 reps   GOALS: Goals reviewed with patient? Yes  SHORT TERM GOALS:  Target date: 08/24/2021   Pt will be independent with initial HEP. Baseline: Goal status: MET on 08/16/2021  2.  Pt will report at least a 40% improvement in dizziness symptoms. Baseline:  Goal status: MET on 08/16/2021   LONG TERM GOALS: Target date: 09/28/2021    Pt will be independent with advanced HEP. Baseline:  Goal status: MET  2.  Pt will improve Dizziness Handicapped Inventory sore to less than 30/100 to indicate decreased dizziness with tasks. Baseline: 54/100 Goal status: MET on 08/16/21  3.  Pt will demonstrate negative Marye Round consistently by last PT treatment session. Baseline:  Goal status: MET on 08/26/21  4.  Pt will report at least an 80% improvement in dizziness during daily tasks and rolling over in bed. Baseline:  Goal status: MET on 08/23/21   ASSESSMENT:  CLINICAL IMPRESSION: Ms Koelzer reports to skilled PT continuing to deny dizziness.  Pt with negative Dix Hallpike on bilateral sides.  Pt has much improved balance noted with activities and was provided with updated HEP to continue to progress at home.  Pt was encouraged to rejoin a gym and participate in chair yoga, as she did before the Pandemic occurred.  Pt has met all outpatient PT goals and is ready for discharge to continue with HEP.   OBJECTIVE IMPAIRMENTS Abnormal gait, decreased balance, and dizziness.   ACTIVITY LIMITATIONS locomotion level  PARTICIPATION LIMITATIONS: community activity  PERSONAL FACTORS Age, Time since onset of injury/illness/exacerbation, and 1-2 comorbidities: Chronic Neck Pain, Dementia  are also affecting patient's functional outcome.   REHAB POTENTIAL: Good  CLINICAL DECISION MAKING: Evolving/moderate complexity  EVALUATION COMPLEXITY: Moderate   PLAN: PT FREQUENCY:  2x/week  PT DURATION: 8 weeks  PLANNED INTERVENTIONS: Therapeutic exercises, Therapeutic activity, Neuromuscular re-education, Balance training, Gait training, Patient/Family education, Joint manipulation, Joint mobilization, Stair training, Vestibular training, Canalith repositioning, Aquatic Therapy, Dry Needling, Electrical stimulation, Spinal manipulation, Spinal mobilization, Cryotherapy, Moist heat, Taping, Traction, Manual therapy, and Re-evaluation  PLAN FOR NEXT SESSION: N/A discharged on 08/26/21     PHYSICAL THERAPY DISCHARGE SUMMARY   Patient agrees to discharge. Patient goals were met. Patient is being discharged due to meeting the stated rehab goals.    Juel Burrow, PT 08/26/2021, 9:51 AM   [facility] [address] [place],  [address] Phone # [phone] Fax [phone]

## 2021-08-27 ENCOUNTER — Encounter: Payer: Self-pay | Admitting: Adult Health

## 2021-08-30 ENCOUNTER — Ambulatory Visit: Payer: PPO | Admitting: Adult Health

## 2021-08-30 ENCOUNTER — Ambulatory Visit: Payer: PPO | Admitting: Rehabilitative and Restorative Service Providers"

## 2021-08-30 ENCOUNTER — Other Ambulatory Visit: Payer: Self-pay | Admitting: Family Medicine

## 2021-08-30 DIAGNOSIS — F419 Anxiety disorder, unspecified: Secondary | ICD-10-CM

## 2021-09-02 ENCOUNTER — Ambulatory Visit: Payer: PPO | Admitting: Rehabilitative and Restorative Service Providers"

## 2021-09-06 ENCOUNTER — Ambulatory Visit: Payer: PPO | Admitting: Rehabilitative and Restorative Service Providers"

## 2021-09-07 DIAGNOSIS — Z602 Problems related to living alone: Secondary | ICD-10-CM | POA: Diagnosis not present

## 2021-09-07 DIAGNOSIS — F0283 Dementia in other diseases classified elsewhere, unspecified severity, with mood disturbance: Secondary | ICD-10-CM | POA: Diagnosis not present

## 2021-09-07 DIAGNOSIS — F32A Depression, unspecified: Secondary | ICD-10-CM | POA: Diagnosis not present

## 2021-09-07 DIAGNOSIS — F0153 Vascular dementia, unspecified severity, with mood disturbance: Secondary | ICD-10-CM | POA: Diagnosis not present

## 2021-09-07 DIAGNOSIS — G309 Alzheimer's disease, unspecified: Secondary | ICD-10-CM | POA: Diagnosis not present

## 2021-09-09 ENCOUNTER — Encounter: Payer: PPO | Admitting: Rehabilitative and Restorative Service Providers"

## 2021-09-13 ENCOUNTER — Encounter: Payer: PPO | Admitting: Rehabilitative and Restorative Service Providers"

## 2021-09-14 ENCOUNTER — Telehealth: Payer: Self-pay

## 2021-09-14 DIAGNOSIS — R300 Dysuria: Secondary | ICD-10-CM

## 2021-09-14 MED ORDER — NITROFURANTOIN MONOHYD MACRO 100 MG PO CAPS: 100.0000 mg | ORAL_CAPSULE | Freq: Two times a day (BID) | ORAL | 0 refills | Status: DC

## 2021-09-14 NOTE — Telephone Encounter (Signed)
No severe symptoms such as fever etc.  Prescription sent for Macrobid.  If not improving over the next several days will need appointment and urine culture performed. Meds ordered this encounter  Medications   nitrofurantoin, macrocrystal-monohydrate, (MACROBID) 100 MG capsule    Sig: Take 1 capsule (100 mg total) by mouth 2 (two) times daily.    Dispense:  10 capsule    Refill:  0

## 2021-09-15 ENCOUNTER — Encounter: Payer: PPO | Admitting: Rehabilitative and Restorative Service Providers"

## 2021-09-20 ENCOUNTER — Encounter: Payer: PPO | Admitting: Rehabilitative and Restorative Service Providers"

## 2021-09-21 ENCOUNTER — Ambulatory Visit (HOSPITAL_BASED_OUTPATIENT_CLINIC_OR_DEPARTMENT_OTHER): Payer: PPO | Admitting: Cardiovascular Disease

## 2021-09-21 ENCOUNTER — Ambulatory Visit (INDEPENDENT_AMBULATORY_CARE_PROVIDER_SITE_OTHER): Payer: PPO

## 2021-09-21 ENCOUNTER — Encounter (HOSPITAL_BASED_OUTPATIENT_CLINIC_OR_DEPARTMENT_OTHER): Payer: Self-pay | Admitting: Cardiovascular Disease

## 2021-09-21 VITALS — BP 97/64 | HR 80 | Ht 65.0 in | Wt 181.0 lb

## 2021-09-21 DIAGNOSIS — I6523 Occlusion and stenosis of bilateral carotid arteries: Secondary | ICD-10-CM

## 2021-09-21 DIAGNOSIS — M79605 Pain in left leg: Secondary | ICD-10-CM

## 2021-09-21 DIAGNOSIS — R600 Localized edema: Secondary | ICD-10-CM

## 2021-09-21 DIAGNOSIS — I951 Orthostatic hypotension: Secondary | ICD-10-CM | POA: Diagnosis not present

## 2021-09-21 HISTORY — DX: Localized edema: R60.0

## 2021-09-21 HISTORY — DX: Orthostatic hypotension: I95.1

## 2021-09-21 NOTE — Assessment & Plan Note (Signed)
She has discomfort in her L leg and also some edema. She isn't very physically active lately.  We will get LE venous Dopplers to assess.

## 2021-09-21 NOTE — Assessment & Plan Note (Signed)
Lipids are well controlled.  Continue Repatha and aspirin.  Repeat carotid Dopplers at follow-up.

## 2021-09-21 NOTE — Assessment & Plan Note (Signed)
Today her blood pressure is low but she has not had any recent symptoms.  When she did she was able to increase the salt in her diet and her symptoms resolved.  Continue to recommend compression socks and hydration.

## 2021-09-21 NOTE — Assessment & Plan Note (Addendum)
Encouraged her to work on increasing her exercise.  Lipids are well controlled on Repatha.  Continue current dose.

## 2021-09-21 NOTE — Patient Instructions (Signed)
Medication Instructions:  Continue current medications  *If you need a refill on your cardiac medications before your next appointment, please call your pharmacy*   Lab Work: None Ordered  If you have labs (blood work) drawn today and your tests are completely normal, you will receive your results only by: [place] (if you have MyChart) OR A paper copy in the mail If you have any lab test that is abnormal or we need to change your treatment, we will call you to review the results.   Testing/Procedures: Your physician has requested that you have a lower extremity venous duplex. This test is an ultrasound of the veins in the legs or arms. It looks at venous blood flow that carries blood from the heart to the legs or arms. Allow one hour for a Lower Venous exam. Allow thirty minutes for an Upper Venous exam. There are no restrictions or special instructions.   Follow-Up: At [facility], you and your health needs are our priority.  As part of our continuing mission to provide you with exceptional heart care, we have created designated Provider Care Teams.  These Care Teams include your primary Cardiologist (physician) and Advanced Practice Providers (APPs -  Physician Assistants and Nurse Practitioners) who all work together to provide you with the care you need, when you need it.  We recommend signing up for the patient portal called "MyChart".  Sign up information is provided on this After Visit Summary.  MyChart is used to connect with patients for Virtual Visits (Telemedicine).  Patients are able to view lab/test results, encounter notes, upcoming appointments, etc.  Non-urgent messages can be sent to your provider as well.   To learn more about what you can do with MyChart, go to [email or web address].    Your next appointment:   1 year(s)  The format for your next appointment:   In Person  Provider:   [name], MD    Other Instructions   Important  Information About Sugar

## 2021-09-21 NOTE — Progress Notes (Signed)
Cardiology Office Note    Date:  09/21/2021   ID:  Cheryl, [name] Apr 11, 1940, MRN [id number]  PCP:  Cheryl Marry, MD  Cardiologist:  [name], MD  Electrophysiologist:  None   Evaluation Performed:  Follow-Up Visit  Chief Complaint: Palpitations, shortness of breath  History of Present Illness:    Cheryl [name] is a 81 y.o. female with hyperlipidemia and mild carotid stenosis who presents for follow up.  She was first seen 04/2015 with a report of shortness of breath.  She had an echo 05/28/15 that revealed LVEF 55-60% with grade 1 diastolic dysfunction. She also had a Lexiscan Myoview 05/07/15 that revealed LVEF 60% with a small, fixed septal/anteroseptal defect that was felt to be artifact. There is no evidence of ischemia.  She has struggled with orthostatic hypotension and was instructed to wear compression stockings increase her salt intake. She underwent L4/L5 decompression surgery on 03/2016.  She then struggled to breathe due to her back brace.     She reported exertional dyspnea.  She was referred for a Lexiscan Myoview at that time revealed LVEF 69% with no ischemia.  Carotid Dopplers still revealed mild stenosis bilaterally.  She noted some heart racing.  She wore an ambulatory monitor that revealed some PACs and 4 beats of SVT.   Today, she is feeling "so-so". She is suffering from migraines daily, which is typical for her especially this time of year in the hot weather. Lately she has not been formally exercising due to her migraines and the weather. When she first lies down at night, she feels like her heart is beating faster. This tends to last about 30 minutes every night. She believes this has been a relatively recent development. She believes her blood pressure is usually fine. Her low reading of 97/64 in clinic today is surprising to her. She is able to increase her blood pressure if it is low by eating salty foods. She endorses intermittent edema in her left  leg. Additionally she complains of easy bruising. She notes ecchymosis of her bilateral UE. Previously this year she had an episode of vertigo which prompted an ED visit. Of note, her daughter was concerned about her memory, so she was seen by gerontology in [place]. She was diagnosed with moderate vascular dementia, and given a new Exelon (rivastigmine) patch medication. She is no longer on Aricept as she felt very ill after taking it. She denies any chest pain, shortness of breath. No lightheadedness, syncope, orthopnea, or PND.   Past Medical History:  Diagnosis Date   Anemia    Anxiety    d/t recent family issues   Arthritis    Bone spur    Carotid stenosis, bilateral 06/06/2019   Chronic back pain    stenosis and OA   Depression    takes Paxil daily   Diverticulosis    Dizzy    occasionally   Encephalitis    at 9 months ago    Fatty liver    has had 2 Hep shots and the final one is in June 18   GERD (gastroesophageal reflux disease)    takes Omeprazole daily   Hepatitis 2012   History of bronchitis    20-25 yrs ago   History of colon polyps    precancerous   History of shingles    Hyperlipidemia    takes Simvastatin daily   IBS (irritable bowel syndrome)    more on side of constipation-takes Miralax daily  Insomnia    takes Melatonin and Ambien nightly   Internal hemorrhoids    Joint pain    Joint swelling    Lower leg edema 09/21/2021   Lung nodules    calcified    Migraine headache    migraines-thinks coming from neck.Last one1/28/18   Orthostatic hypotension 09/21/2021   Pneumonia    hx of-20 yrs ago-=-walking   Renal cyst    left   Uterine cancer ([place]) 1973   Past Surgical History:  Procedure Laterality Date   APPENDECTOMY  1966   cataracts     bilateral   COLONOSCOPY  09/04/2020   EUS  02/10/2011   Procedure: UPPER ENDOSCOPIC ULTRASOUND (EUS) RADIAL;  Surgeon: Owens Loffler, MD;  Location: [facility];  Service: Endoscopy;  Laterality: N/A;   radial linear    LUMBAR FUSION  03/2016   L4-5, Dr. Ola Spurr.   RECTOCELE REPAIR  2011   ROTATOR CUFF REPAIR  2009   left   TONSILLECTOMY     UPPER GASTROINTESTINAL ENDOSCOPY     VAGINAL HYSTERECTOMY  1973     Current Meds  Medication Sig   aspirin 81 MG tablet Take 81 mg by mouth daily.   Cholecalciferol (VITAMIN D) 1000 UNITS capsule Take 2,000 Units by mouth daily.   cycloSPORINE (RESTASIS) 0.05 % ophthalmic emulsion Place 1 drop into both eyes 2 (two) times daily. (Patient taking differently: Place 1 drop into both eyes in the morning and at bedtime.)   EXCEDRIN MIGRAINE 250-250-65 MG tablet Take 2 tablets by mouth every 6 (six) hours as needed for migraine or headache.   hydrOXYzine (VISTARIL) 25 MG capsule TAKE 1 CAPSULE BY MOUTH 3 TIMES DAILY AS NEEDED FOR ITCHING. (Patient taking differently: Take 25 mg by mouth 3 (three) times daily as needed for itching.)   MAGNESIUM PO Take 1 tablet by mouth daily.    Omega-3 Fatty Acids (FISH OIL) 1000 MG CAPS Take 1 capsule (1,000 mg total) by mouth daily. (Patient taking differently: Take 2,000 mg by mouth daily.)   PARoxetine (PAXIL) 40 MG tablet TAKE 1 TABLET BY MOUTH EVERY DAY   polyethylene glycol (MIRALAX / GLYCOLAX) packet Take 17 g by mouth daily.   REPATHA SURECLICK 161 MG/ML SOAJ INJECT 140 MG INTO THE SKIN EVERY 14 (FOURTEEN) DAYS.   rivastigmine (EXELON) 4.6 mg/24hr Place 4.6 mg onto the skin daily.   traMADol (ULTRAM) 50 MG tablet Take 2 tablets (100 mg total) by mouth daily as needed. (Patient taking differently: Take 100 mg by mouth daily as needed (for pain).)   Ubrogepant (UBRELVY) 100 MG TABS Take 100 mg by mouth every 2 (two) hours as needed. Maximum '200mg'$  a day. (Patient taking differently: Take 100 mg by mouth every 2 (two) hours as needed (for migraines- not to exceed 200 mg/24 hours).)   [DISCONTINUED] donepezil (ARICEPT) 10 MG tablet Take 1 tablet (10 mg total) by mouth at bedtime. Please start after completing the 5  mg dose for 4 weeks.     Allergies:   Celebrex [celecoxib], Fluoxetine hcl, Ambien [zolpidem], Amitriptyline, Contrast media [iodinated contrast media], Dexamethasone, Livalo [pitavastatin], Metrizamide, Nitroglycerin, Rosuvastatin, Simvastatin, Statins, Codeine sulfate, and Sulfonamide derivatives   Social History   Tobacco Use   Smoking status: Former    Types: Cigarettes    Quit date: 02/07/1993    Years since quitting: 28.6   Smokeless tobacco: Never  Vaping Use   Vaping Use: Never used  Substance Use Topics   Alcohol use: No  Drug use: No     Family Hx: The patient's family history includes Alcohol abuse in her brother and father; Crohn's disease in her brother; Depression in her brother; HIV in her son; Heart disease in her father and another family member; Heart failure in her daughter; Kidney disease in her mother; Kidney failure in her mother; Lung cancer in her father and son; Rheum arthritis in her mother. There is no history of Malignant hyperthermia, Colon cancer, Esophageal cancer, Rectal cancer, Stomach cancer, or Migraines.  ROS:   Please see the history of present illness.    (+) Headaches/migraines (+) Nocturnal palpitations (+) LLE edema All other systems reviewed and are negative.   Prior CV studies:   The following studies were reviewed today:  MRA Neck  07/25/2021: IMPRESSION: 1. 50% stenosis of the proximal left ICA. 2. Mild atherosclerotic irregularity along the medial aspect of the proximal right ICA without a significant stenosis. 3. Normal appearance of the vertebral arteries bilaterally. No acute or focal abnormality to explain the patient's symptoms when turning her head.  MRA Head  07/25/2021: IMPRESSION: Normal MRA circle-of-Willis without evidence for significant proximal stenosis, aneurysm, or branch vessel occlusion.  Bilateral Carotid Dopplers  06/11/2021: Summary:  Right Carotid: Velocities in the right ICA are consistent with a 1-39%   stenosis.   Left Carotid: Velocities in the left ICA are consistent with a 1-39%  stenosis.   Vertebrals:  Bilateral vertebral arteries demonstrate antegrade flow.  Subclavians: Normal flow hemodynamics were seen in bilateral subclavian               arteries.   3 Day Zio Monitor 06/28/2019: Quality: Fair.  Baseline artifact. Predominant rhythm: sinus rhythm Average heart rate: 85 bpm Max heart rate: 164 bpm Min heart rate: 64 bpm Pauses >2.5 seconds: none   4 beats SVT PACs noted  Lexiscan Myoview 06/14/19: Lexiscan stress: Electrically negative for ischemia Myovue with normal perfusion No ischemia or scar LVEF 69% Low risk scan  Echo 05/28/15: Study Conclusions - Left ventricle: The cavity size was normal. Wall thickness was   normal. Systolic function was normal. The estimated ejection   fraction was in the range of 55% to 60%. Wall motion was normal;   there were no regional wall motion abnormalities. Doppler   parameters are consistent with abnormal left ventricular   relaxation (grade 1 diastolic dysfunction).   Impressions: - Normal LV systolic function; grade 1 diastolic dysfunction; trace   MR and TR.      Labs/Other Tests and Data Reviewed:    EKG:  EKG is personally reviewed. 09/21/2021:  EKG was not ordered. 08/19/2019:  Sinus rhythm. Rate 80 bpm. Low voltage.  Recent Labs: 02/15/2021: TSH 2.94 07/25/2021: ALT 37; BUN 15; Creatinine, Ser 0.85; Hemoglobin 14.7; Platelets 255; Potassium 3.6; Sodium 140   Recent Lipid Panel Lab Results  Component Value Date/Time   CHOL 146 06/14/2021 08:35 AM   CHOL 244 (H) 03/02/2011 12:45 AM   TRIG 189 (H) 06/14/2021 08:35 AM   TRIG 303 (H) 03/02/2011 12:45 AM   HDL 42 06/14/2021 08:35 AM   HDL 45 03/02/2011 12:45 AM   CHOLHDL 3.5 06/14/2021 08:35 AM   CHOLHDL 4.9 10/04/2018 10:37 AM   LDLCALC 72 06/14/2021 08:35 AM   LDLCALC 121 (H) 10/04/2018 10:37 AM   LDLCALC 138 (H) 03/02/2011 12:45 AM    Wt Readings from  Last 3 Encounters:  09/21/21 181 lb (82.1 kg)  08/24/21 179 lb 4 oz (81.3 kg)  07/28/21 182 lb (82.6 kg)     Objective:    VS:  BP 97/64   Pulse 80   Ht '5\' 5"'$  (1.651 m)   Wt 181 lb (82.1 kg)   BMI 30.12 kg/m  , BMI Body mass index is 30.12 kg/m. GENERAL:  Well appearing HEENT: Pupils equal round and reactive, fundi not visualized, oral mucosa unremarkable NECK:  No jugular venous distention, waveform within normal limits, carotid upstroke brisk and symmetric, no bruits LUNGS:  Clear to auscultation bilaterally HEART:  RRR.  PMI not displaced or sustained,S1 and S2 within normal limits, no S3, no S4, no clicks, no rubs, no murmurs ABD:  Flat, positive bowel sounds normal in frequency in pitch, no bruits, no rebound, no guarding, no midline pulsatile mass, no hepatomegaly, no splenomegaly EXT:  2 plus pulses throughout, 1+  L LE edema, no cyanosis no clubbing SKIN:  No rashes no nodules; mild Ecchymosis of LUE  approximately 1cm diameter NEURO:  Cranial nerves II through XII grossly intact, motor grossly intact throughout PSYCH:  Cognitively intact, oriented to person place and time  ASSESSMENT & PLAN:    Carotid stenosis, bilateral Lipids are well controlled.  Continue Repatha and aspirin.  Repeat carotid Dopplers at follow-up.  Hyperlipidemia Encouraged her to work on increasing her exercise.  Lipids are well controlled on Repatha.  Continue current dose.  Lower leg edema She has discomfort in her L leg and also some edema. She isn't very physically active lately.  We will get LE venous Dopplers to assess.   Orthostatic hypotension Today her blood pressure is low but she has not had any recent symptoms.  When she did she was able to increase the salt in her diet and her symptoms resolved.  Continue to recommend compression socks and hydration.   Disposition:   FU with Anaih Brander C. Oval Linsey, MD, [facility] in 1 year  Medication Adjustments/Labs and Tests Ordered: Current medicines  are reviewed at length with the patient today.  Concerns regarding medicines are outlined above.   Tests Ordered: Orders Placed This Encounter  Procedures   VAS [name] LOWER EXTREMITY VENOUS (DVT)   Medication Changes: No orders of the defined types were placed in this encounter.  Patient Instructions  Medication Instructions:  Continue current medications  *If you need a refill on your cardiac medications before your next appointment, please call your pharmacy*   Lab Work: None Ordered  If you have labs (blood work) drawn today and your tests are completely normal, you will receive your results only by: [place] (if you have MyChart) OR A paper copy in the mail If you have any lab test that is abnormal or we need to change your treatment, we will call you to review the results.   Testing/Procedures: Your physician has requested that you have a lower extremity venous duplex. This test is an ultrasound of the veins in the legs or arms. It looks at venous blood flow that carries blood from the heart to the legs or arms. Allow one hour for a Lower Venous exam. Allow thirty minutes for an Upper Venous exam. There are no restrictions or special instructions.   Follow-Up: At [facility], you and your health needs are our priority.  As part of our continuing mission to provide you with exceptional heart care, we have created designated Provider Care Teams.  These Care Teams include your primary Cardiologist (physician) and Advanced Practice Providers (APPs -  Physician Assistants and Nurse Practitioners) who all  work together to provide you with the care you need, when you need it.  We recommend signing up for the patient portal called "MyChart".  Sign up information is provided on this After Visit Summary.  MyChart is used to connect with patients for Virtual Visits (Telemedicine).  Patients are able to view lab/test results, encounter notes, upcoming appointments, etc.  Non-urgent  messages can be sent to your provider as well.   To learn more about what you can do with MyChart, go to [email or web address].    Your next appointment:   1 year(s)  The format for your next appointment:   In Person  Provider:   [name], MD    Other Instructions   Important Information About Sugar        I,Mathew Stumpf,acting as a scribe for [name], MD.,have documented all relevant documentation on the behalf of [name], MD,as directed by  [name], MD while in the presence of [name], MD.  I, [place] Oval Linsey, MD have reviewed all documentation for this visit.  The documentation of the exam, diagnosis, procedures, and orders on 09/21/2021 are all accurate and complete.   Signed, [name], MD  09/21/2021 11:32 AM    [place]

## 2021-09-23 ENCOUNTER — Encounter: Payer: PPO | Admitting: Rehabilitative and Restorative Service Providers"

## 2021-09-23 DIAGNOSIS — M47812 Spondylosis without myelopathy or radiculopathy, cervical region: Secondary | ICD-10-CM | POA: Diagnosis not present

## 2021-09-23 MED ORDER — LANSOPRAZOLE 30 MG PO CPDR
30.0000 mg | DELAYED_RELEASE_CAPSULE | Freq: Every morning | ORAL | 1 refills | Status: DC
Start: 2021-09-23 — End: 2021-10-01

## 2021-09-23 NOTE — Addendum Note (Signed)
Addended by: [name] D on: 09/23/2021 05:55 PM   Modules accepted: Orders

## 2021-09-23 NOTE — Telephone Encounter (Signed)
Lets try lansoprazole I will send over prescription to the pharmacy it looks like it is covered.  Try taking that once a day about 20 minutes before breakfast.  Can increase to twice a day if needed.  If she still having symptoms at that point then we need to refer her to GI.  She could have an ulcer or something else going on.

## 2021-09-24 MED ORDER — SUCRALFATE 1 G PO TABS: 1.0000 g | ORAL_TABLET | Freq: Three times a day (TID) | ORAL | 1 refills | Status: DC

## 2021-09-24 NOTE — Telephone Encounter (Signed)
Sent carafate

## 2021-09-24 NOTE — Addendum Note (Signed)
Addended by: [name] D on: 09/24/2021 06:32 PM   Modules accepted: Orders

## 2021-09-27 ENCOUNTER — Encounter: Payer: PPO | Admitting: Rehabilitative and Restorative Service Providers"

## 2021-09-27 NOTE — Addendum Note (Signed)
Addended by: Fonnie Mu on: 09/27/2021 01:28 PM   Modules accepted: Orders

## 2021-09-30 ENCOUNTER — Encounter: Payer: PPO | Admitting: Rehabilitative and Restorative Service Providers"

## 2021-09-30 ENCOUNTER — Other Ambulatory Visit: Payer: Self-pay | Admitting: Family Medicine

## 2021-10-04 ENCOUNTER — Encounter: Payer: PPO | Admitting: Rehabilitative and Restorative Service Providers"

## 2021-10-07 ENCOUNTER — Encounter: Payer: PPO | Admitting: Rehabilitative and Restorative Service Providers"

## 2021-10-08 ENCOUNTER — Telehealth (INDEPENDENT_AMBULATORY_CARE_PROVIDER_SITE_OTHER): Payer: PPO | Admitting: Family Medicine

## 2021-10-08 DIAGNOSIS — F419 Anxiety disorder, unspecified: Secondary | ICD-10-CM | POA: Diagnosis not present

## 2021-10-08 DIAGNOSIS — F32A Depression, unspecified: Secondary | ICD-10-CM | POA: Diagnosis not present

## 2021-10-08 DIAGNOSIS — Z87440 Personal history of urinary (tract) infections: Secondary | ICD-10-CM

## 2021-10-08 DIAGNOSIS — F03B4 Unspecified dementia, moderate, with anxiety: Secondary | ICD-10-CM

## 2021-10-08 MED ORDER — PAROXETINE HCL 10 MG PO TABS: ORAL_TABLET | ORAL | 0 refills | Status: DC

## 2021-10-08 MED ORDER — ESCITALOPRAM OXALATE 5 MG PO TABS: 5.0000 mg | ORAL_TABLET | Freq: Every day | ORAL | 0 refills | Status: DC

## 2021-10-08 MED ORDER — ESTROGENS CONJUGATED 0.625 MG/GM VA CREA
TOPICAL_CREAM | VAGINAL | 5 refills | Status: DC
Start: 2021-10-08 — End: 2023-02-20

## 2021-10-08 NOTE — Assessment & Plan Note (Signed)
Discussed prevention including drinking plenty of water, frequently emptying the bladder and not holding the urine wearing cotton underwear.  There is some data to show that cranberry capsules might be helpful so that certainly an option and should not interfere with her current medications.  We also discussed topical estrogen since she is postmenopausal versus low-dose antibiotics.  She has used the topical estrogen in the past and found it helpful so we will start with that.  She will apply 1 pea-sized amount to the urethra and vaginal opening nightly for 10 days and then 3 times a week thereafter.  We discussed that usually after couple of months on the regimen most women can decrease down to twice a week usage for maintenance and that seems to work well.

## 2021-10-08 NOTE — Assessment & Plan Note (Signed)
We will taper off Paxil.  New prescription sent with instructions.  Her daughter was also present for the visit and took notes as well.  Once she gets down to the 10 mg every other day okay to go ahead and start the Lexapro so they will technically overlap for 1 week.  Plan to follow-up in about 4 weeks after starting the new medication and we can adjust regimen then if needed.

## 2021-10-08 NOTE — Assessment & Plan Note (Signed)
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  They did recommend that we change her SSRI to either Celexa, Lexapro, or sertraline.

## 2021-10-08 NOTE — Progress Notes (Signed)
Virtual Visit via Video Note  I connected with Cheryl [name] on 10/08/21 at  1:40 PM EDT by a video enabled telemedicine application and verified that I am speaking with the correct person using two identifiers.   I discussed the limitations of evaluation and management by telemedicine and the availability of in person appointments. The patient expressed understanding and agreed to proceed.  Patient location: at home Provider location: in office  Subjective:    CC:  No chief complaint on file.   HPI:  She did have a consultation August 1 with a geriatrician for consultation for memory impairment.  She did meet criteria for dementia but most consistent with vascular dementia probably a combination of vascular and Alzheimer's.  She did have GI intolerance with Aricept so they decided not to restart it and so recommended a trial of Namenda or Exelon patch.  They opted for Exelon patch.  Also recommended possibly switching her SSRI from Paxil to either citalopram escitalopram or sertraline.  SSRIs tried in the past: I could only find Paxil on the medication list starting back in 2012.    Hx of recurrent UTIs.  Wants to discuss options for prophylaxis.  She has done Premarin in the past and remembers doing well with that.  But she has not done it in a couple years.  Last UTI was about 3 weeks ago.   Past medical history, Surgical history, Family history not pertinant except as noted below, Social history, Allergies, and medications have been entered into the medical record, reviewed, and corrections made.    Objective:    General: Speaking clearly in complete sentences without any shortness of breath.  Alert and oriented x3.  Normal judgment. No apparent acute distress.    Impression and Recommendations:    Problem List Items Addressed This Visit       Nervous and Auditory   Dementia [facility])    Did consult with geriatrician who felt that dementia was consistent with a  combination of vascular and Alzheimer's.  They did recommend that we change her SSRI to either Celexa, Lexapro, or sertraline.      Relevant Medications   PARoxetine (PAXIL) 10 MG tablet   escitalopram (LEXAPRO) 5 MG tablet     Other   History of recurrent UTIs    Discussed prevention including drinking plenty of water, frequently emptying the bladder and not holding the urine wearing cotton underwear.  There is some data to show that cranberry capsules might be helpful so that certainly an option and should not interfere with her current medications.  We also discussed topical estrogen since she is postmenopausal versus low-dose antibiotics.  She has used the topical estrogen in the past and found it helpful so we will start with that.  She will apply 1 pea-sized amount to the urethra and vaginal opening nightly for 10 days and then 3 times a week thereafter.  We discussed that usually after couple of months on the regimen most women can decrease down to twice a week usage for maintenance and that seems to work well.      Anxiety and depression    We will taper off Paxil.  New prescription sent with instructions.  Her daughter was also present for the visit and took notes as well.  Once she gets down to the 10 mg every other day okay to go ahead and start the Lexapro so they will technically overlap for 1 week.  Plan to follow-up in  about 4 weeks after starting the new medication and we can adjust regimen then if needed.      Relevant Medications   PARoxetine (PAXIL) 10 MG tablet   escitalopram (LEXAPRO) 5 MG tablet    No orders of the defined types were placed in this encounter.   Meds ordered this encounter  Medications   PARoxetine (PAXIL) 10 MG tablet    Sig: Take 3 tablets (30 mg total) by mouth daily for 7 days, THEN 2 tablets (20 mg total) daily for 7 days, THEN 1 tablet (10 mg total) daily for 7 days, THEN 1 tablet (10 mg total) every other day for 7 days.    Dispense:  46 tablet     Refill:  0   escitalopram (LEXAPRO) 5 MG tablet    Sig: Take 1 tablet (5 mg total) by mouth daily.    Dispense:  30 tablet    Refill:  0    Will start after paxil tapered off   conjugated estrogens (PREMARIN) vaginal cream    Sig: 1 pea sized application to urethra and vaginal opening nightly x 10 days and then 3 x a week, thereafter.    Dispense:  42.5 g    Refill:  5     I discussed the assessment and treatment plan with the patient. The patient was provided an opportunity to ask questions and all were answered. The patient agreed with the plan and demonstrated an understanding of the instructions.   The patient was advised to call back or seek an in-person evaluation if the symptoms worsen or if the condition fails to improve as anticipated.   [name], MD

## 2021-10-14 ENCOUNTER — Other Ambulatory Visit: Payer: Self-pay | Admitting: Family Medicine

## 2021-10-14 DIAGNOSIS — G3184 Mild cognitive impairment, so stated: Secondary | ICD-10-CM

## 2021-10-19 ENCOUNTER — Other Ambulatory Visit: Payer: Self-pay | Admitting: Sports Medicine

## 2021-10-19 NOTE — Telephone Encounter (Signed)
I have not seen this patient in over a year but PCP may be willing to fill this.

## 2021-10-26 DIAGNOSIS — M542 Cervicalgia: Secondary | ICD-10-CM | POA: Diagnosis not present

## 2021-10-26 DIAGNOSIS — Z6830 Body mass index (BMI) 30.0-30.9, adult: Secondary | ICD-10-CM | POA: Diagnosis not present

## 2021-10-26 DIAGNOSIS — G894 Chronic pain syndrome: Secondary | ICD-10-CM | POA: Diagnosis not present

## 2021-10-29 ENCOUNTER — Encounter: Payer: Self-pay | Admitting: Family Medicine

## 2021-10-29 ENCOUNTER — Ambulatory Visit (INDEPENDENT_AMBULATORY_CARE_PROVIDER_SITE_OTHER): Payer: PPO | Admitting: Family Medicine

## 2021-10-29 DIAGNOSIS — Z0000 Encounter for general adult medical examination without abnormal findings: Secondary | ICD-10-CM

## 2021-10-29 MED ORDER — ESCITALOPRAM OXALATE 10 MG PO TABS: 10.0000 mg | ORAL_TABLET | Freq: Every day | ORAL | 0 refills | Status: DC

## 2021-10-29 NOTE — Progress Notes (Addendum)
MEDICARE ANNUAL WELLNESS VISIT  10/29/2021  Telephone Visit Disclaimer This Medicare AWV was conducted by telephone due to national recommendations for restrictions regarding the COVID-19 Pandemic (e.g. social distancing).  I verified, using two identifiers, that I am speaking with Cheryl [name] or their authorized healthcare agent. I discussed the limitations, risks, security, and privacy concerns of performing an evaluation and management service by telephone and the potential availability of an in-person appointment in the future. The patient expressed understanding and agreed to proceed.  Location of Patient: Home Location of Provider (nurse):  In the office.  Subjective:    Cheryl [name] is a 81 y.o. female patient of Metheney, Rene Kocher, MD who had a Medicare Annual Wellness Visit today via telephone. Cheryl [name] is Retired and lives alone. she has 5 children, 2 living and 3 deceased. she reports that she is socially active and does interact with friends/family regularly. she is minimally physically active and enjoys playing cards.  Patient Care Team: Hali Marry, MD as PCP - General (Family Medicine) [name], MD as PCP - Cardiology (Cardiology) Irene Shipper, MD as Attending Physician (Gastroenterology) Clydell Hakim, MD (Inactive) as Attending Physician (Pain Medicine) Calvert Cantor, MD as Consulting Physician (Ophthalmology) Darius Bump, [facility] as Pharmacist (Pharmacist) Lavonna Monarch, MD (Inactive) as Consulting Physician (Dermatology)     10/29/2021    9:06 AM 08/03/2021   11:11 AM 07/25/2021    9:32 AM 12/22/2020    8:58 AM 10/17/2020   10:18 AM 01/01/2019   10:09 AM 08/24/2018   10:08 AM  Advanced Directives  Does Patient Have a Medical Advance Directive? No Yes No Yes Yes Yes Yes  Type of Social research officer, government;Living will  Living will;Healthcare Power of [place];Living  will [place];Living will  Does patient want to make changes to medical advance directive?  No - Patient declined    No - Guardian declined No - Patient declined  Copy of [place] in Chart?  No - copy requested  No - copy requested Yes - validated most recent copy scanned in chart (See row information) No - copy requested   Would patient like information on creating a medical advance directive? No - Patient declined No - Patient declined No - Patient declined        Hospital Utilization Over the Past 12 Months: # of hospitalizations or ER visits: 1 # of surgeries: 0  Review of Systems    Patient reports that her overall health is worse compared to last year.  History obtained from chart review and the patient  Patient Reported Readings (BP, Pulse, CBG, Weight, etc) none  Pain Assessment Pain : 0-10 Pain Score: 4  Pain Type: Chronic pain Pain Location: Back Pain Descriptors / Indicators: Aching     Current Medications & Allergies (verified) Allergies as of 10/29/2021       Reactions   Celebrex [celecoxib] Itching, Nausea Only, Other (See Comments)   Gas, hair loss also   Fluoxetine Hcl Shortness Of Breath, Itching   Ambien [zolpidem] Other (See Comments)   Headaches    Amitriptyline Other (See Comments)   Dry mouth   Contrast Media [iodinated Contrast Media] Itching, Other (See Comments)   MultiHance (probably not "Multilance")   Dexamethasone Diarrhea, Nausea And Vomiting   Livalo [pitavastatin] Other (See Comments)   Myalgia    Metrizamide Itching, Other (See Comments)  AMIPAQUE (also allergic to MultiHance (probably not "Multilance")   Nitroglycerin Nausea Only, Other (See Comments)   Dizziness and sweating, also   Rosuvastatin Other (See Comments)   Body aches and cramping   Simvastatin Other (See Comments)   Muscle aches and headaches   Statins Other (See Comments)   Could NOT tolerate- "Made me feel like I had the flu"    Codeine Sulfate Itching   Sulfonamide Derivatives Nausea Only        Medication List        Accurate as of October 29, 2021  9:23 AM. If you have any questions, ask your nurse or doctor.          aspirin 81 MG tablet Take 81 mg by mouth daily.   conjugated estrogens vaginal cream Commonly known as: PREMARIN 1 pea sized application to urethra and vaginal opening nightly x 10 days and then 3 x a week, thereafter.   CurcuPlex-95 500 MG Caps Generic drug: Turmeric Take 1 capsule by mouth daily.   cycloSPORINE 0.05 % ophthalmic emulsion Commonly known as: Restasis Place 1 drop into both eyes 2 (two) times daily. What changed: when to take this   donepezil 5 MG tablet Commonly known as: ARICEPT TAKE 1 TABLET BY MOUTH EVERYDAY AT BEDTIME   escitalopram 5 MG tablet Commonly known as: LEXAPRO TAKE 1 TABLET (5 MG TOTAL) BY MOUTH DAILY.   Excedrin Migraine 250-250-65 MG tablet Generic drug: aspirin-acetaminophen-caffeine Take 2 tablets by mouth every 6 (six) hours as needed for migraine or headache.   Fish Oil 1000 MG Caps Take 1 capsule (1,000 mg total) by mouth daily. What changed: how much to take   hydrOXYzine 25 MG capsule Commonly known as: VISTARIL TAKE 1 CAPSULE BY MOUTH 3 TIMES DAILY AS NEEDED FOR ITCHING. What changed: See the new instructions.   lansoprazole 30 MG capsule Commonly known as: PREVACID TAKE 1 CAPSULE (30 MG TOTAL) BY MOUTH IN THE MORNING   MAGNESIUM PO Take 1 tablet by mouth daily.   Multi-Vitamin tablet Take 1 tablet by mouth daily.   PARoxetine 10 MG tablet Commonly known as: Paxil Take 3 tablets (30 mg total) by mouth daily for 7 days, THEN 2 tablets (20 mg total) daily for 7 days, THEN 1 tablet (10 mg total) daily for 7 days, THEN 1 tablet (10 mg total) every other day for 7 days. Start taking on: October 08, 2021   polyethylene glycol 17 g packet Commonly known as: MIRALAX / GLYCOLAX Take 17 g by mouth daily.   Repatha  SureClick 509 MG/ML [place] Generic drug: Evolocumab INJECT 140 MG INTO THE SKIN EVERY 14 (FOURTEEN) DAYS.   rivastigmine 4.6 mg/24hr Commonly known as: EXELON Place 4.6 mg onto the skin daily.   sucralfate 1 g tablet Commonly known as: Carafate Take 1 tablet (1 g total) by mouth 4 (four) times daily -  with meals and at bedtime.   traMADol 50 MG tablet Commonly known as: ULTRAM Take 1 tablet (50 mg total) by mouth 2 (two) times daily as needed (for pain).   Ubrelvy 100 MG Tabs Generic drug: Ubrogepant Take 100 mg by mouth every 2 (two) hours as needed. Maximum '200mg'$  a day. What changed:  reasons to take this additional instructions   Vitamin D 1000 units capsule Take 2,000 Units by mouth daily.        History (reviewed): Past Medical History:  Diagnosis Date   Anemia    Anxiety    d/t recent  family issues   Arthritis    Bone spur    Carotid stenosis, bilateral 06/06/2019   Chronic back pain    stenosis and OA   Depression    takes Paxil daily   Diverticulosis    Dizzy    occasionally   Encephalitis    at 9 months ago    Fatty liver    has had 2 Hep shots and the final one is in June 18   GERD (gastroesophageal reflux disease)    takes Omeprazole daily   Hepatitis 2012   History of bronchitis    20-25 yrs ago   History of colon polyps    precancerous   History of shingles    Hyperlipidemia    takes Simvastatin daily   IBS (irritable bowel syndrome)    more on side of constipation-takes Miralax daily   Insomnia    takes Melatonin and Ambien nightly   Internal hemorrhoids    Joint pain    Joint swelling    Lower leg edema 09/21/2021   Lung nodules    calcified    Migraine headache    migraines-thinks coming from neck.Last one1/28/18   Orthostatic hypotension 09/21/2021   Pneumonia    hx of-20 yrs ago-=-walking   Renal cyst    left   Uterine cancer ([place]) 1973   Past Surgical History:  Procedure Laterality Date   APPENDECTOMY  1966   cataracts      bilateral   COLONOSCOPY  09/04/2020   EUS  02/10/2011   Procedure: UPPER ENDOSCOPIC ULTRASOUND (EUS) RADIAL;  Surgeon: Owens Loffler, MD;  Location: [facility];  Service: Endoscopy;  Laterality: N/A;  radial linear    LUMBAR FUSION  03/2016   L4-5, Dr. Ola Spurr.   [place]   ROTATOR CUFF REPAIR  2009   left   TONSILLECTOMY     UPPER GASTROINTESTINAL ENDOSCOPY     VAGINAL HYSTERECTOMY  1973   Family History  Problem Relation Age of Onset   Kidney disease Mother    Kidney failure Mother        med induced   Rheum arthritis Mother    Heart disease Father    Alcohol abuse Father    Lung cancer Father        smoker   Depression Brother    Crohn's disease Brother    Alcohol abuse Brother    Heart failure Daughter    Lung cancer Son    HIV Son    Heart disease Other        Paternal family    Malignant hyperthermia Neg Hx    Colon cancer Neg Hx    Esophageal cancer Neg Hx    Rectal cancer Neg Hx    Stomach cancer Neg Hx    Migraines Neg Hx    Social History   Socioeconomic History   Marital status: Divorced    Spouse name: Not on file   Number of children: 5   Years of education: 9   Highest education level: 9th grade  Occupational History   Occupation: Retired      Fish farm manager: RETIRED  Tobacco Use   Smoking status: Former    Types: Cigarettes    Quit date: 02/07/1993    Years since quitting: 28.7   Smokeless tobacco: Never  Vaping Use   Vaping Use: Never used  Substance and Sexual Activity   Alcohol use: No   Drug use: No   Sexual activity: Not on  file  Other Topics Concern   Not on file  Social History Narrative   Lives with her dog. She enjoys playing cards. Her daughter lives close by.                  Social Determinants of Health   Financial Resource Strain: Low Risk  (10/25/2021)   Overall Financial Resource Strain (CARDIA)    Difficulty of Paying Living Expenses: Not very hard  Food Insecurity: No Food Insecurity (10/25/2021)    Hunger Vital Sign    Worried About Running Out of Food in the Last Year: Never true    Ran Out of Food in the Last Year: Never true  Transportation Needs: No Transportation Needs (10/25/2021)   PRAPARE - Hydrologist (Medical): No    Lack of Transportation (Non-Medical): No  Physical Activity: Inactive (10/25/2021)   Exercise Vital Sign    Days of Exercise per Week: 0 days    Minutes of Exercise per Session: 0 min  Stress: No Stress Concern Present (10/25/2021)   [place]    Feeling of Stress : Not at all  Social Connections: Moderately Isolated (10/29/2021)   Social Connection and Isolation Panel [NHANES]    Frequency of Communication with Friends and Family: More than three times a week    Frequency of Social Gatherings with Friends and Family: Twice a week    Attends Religious Services: 1 to 4 times per year    Active Member of [employer] or Organizations: No    Attends Archivist Meetings: Never    Marital Status: Divorced    Activities of Daily Living    10/25/2021   12:18 PM  In your present state of health, do you have any difficulty performing the following activities:  Hearing? 0  Vision? 0  Difficulty concentrating or making decisions? 1  Walking or climbing stairs? 1  Dressing or bathing? 0  Doing errands, shopping? 0  Preparing Food and eating ? N  Using the Toilet? N  In the past six months, have you accidently leaked urine? Y  Do you have problems with loss of bowel control? N  Managing your Medications? Y  Managing your Finances? N  Housekeeping or managing your Housekeeping? Y    Patient Education/ Literacy How often do you need to have someone help you when you read instructions, pamphlets, or other written materials from your doctor or pharmacy?: 1 - Never What is the last grade level you completed in school?: 9th grade  Exercise Current Exercise  Habits: The patient does not participate in regular exercise at present, Exercise limited by: None identified  Diet Patient reports consuming 2 meals a day and 1 snack(s) a day Patient reports that her primary diet is: Regular Patient reports that she does have regular access to food.   Depression Screen    10/29/2021    9:11 AM 04/09/2021    3:51 PM 03/11/2021    9:20 AM 10/17/2020   10:25 AM 07/02/2020    9:24 AM 01/01/2020    9:26 AM 11/19/2019   10:24 AM  PHQ 2/9 Scores  PHQ - 2 Score 0 0 0 0 2 0 2  PHQ- 9 Score     '5 6 12     '$ Fall Risk    10/29/2021    9:11 AM 10/25/2021   12:18 PM 06/14/2021    1:59 PM 04/09/2021    3:51  PM 03/11/2021    9:19 AM  Fall Risk   Falls in the past year? 1 1 0 0 1  Number falls in past yr: 1 1 0 1 1  Injury with Fall? 1 1 0 0 1  Risk for fall due to : History of fall(s)  No Fall Risks No Fall Risks Other (Comment)  Risk for fall due to: Comment     [name] backwards getting out of chair  Follow up Falls evaluation completed;Education provided;Falls prevention discussed  Falls prevention discussed;Falls evaluation completed Falls evaluation completed Falls evaluation completed     Objective:  Cheryl [name] seemed alert and oriented and she participated appropriately during our telephone visit.  Blood Pressure Weight BMI  BP Readings from Last 3 Encounters:  09/21/21 97/64  08/24/21 102/69  07/28/21 (!) 122/49   Wt Readings from Last 3 Encounters:  09/21/21 181 lb (82.1 kg)  08/24/21 179 lb 4 oz (81.3 kg)  07/28/21 182 lb (82.6 kg)   BMI Readings from Last 1 Encounters:  09/21/21 30.12 kg/m    *Unable to obtain current vital signs, weight, and BMI due to telephone visit type  Hearing/Vision  Cheryl [name] did not seem to have difficulty with hearing/understanding during the telephone conversation Reports that she has had a formal eye exam by an eye care professional within the past year Reports that she has not had a formal hearing evaluation within the  past year *Unable to fully assess hearing and vision during telephone visit type  Cognitive Function:    10/29/2021    9:13 AM 10/17/2020   10:32 AM 12/31/2018    9:12 AM 12/22/2016    9:34 AM 12/22/2015    9:25 AM  6CIT Screen  What Year? 0 points 0 points 0 points 0 points 0 points  What month? 0 points 0 points 0 points 0 points 0 points  What time? 0 points 0 points 0 points 0 points 0 points  Count back from 20 2 points 0 points 0 points 0 points 0 points  Months in reverse 0 points  0 points 0 points 0 points  Repeat phrase 6 points 0 points 2 points 2 points 0 points  Total Score 8 points  2 points 2 points 0 points   (Normal:0-7, Significant for Dysfunction: >8)  Normal Cognitive Function Screening: No; patient was recently diagnosed with dementia.    Immunization & Health Maintenance Record Immunization History  Administered Date(s) Administered   Fluad Quad(high Dose 65+) 10/04/2018, 11/19/2019, 10/22/2021   Hep A / Hep B 12/22/2015, 01/19/2016, 06/20/2016   Influenza Split 12/20/2011   Influenza Whole 11/08/2004, 11/08/2005, 11/24/2006, 11/12/2007, 12/15/2008, 11/27/2009   Influenza, High Dose Seasonal PF 09/26/2016, 12/09/2020   Influenza,inj,Quad PF,6+ Mos 10/10/2012, 10/01/2013, 12/31/2014, 10/05/2015, 09/21/2017   Influenza-Unspecified 09/21/2017   PFIZER Comirnaty(Gray Top)Covid-19 Tri-Sucrose Vaccine 06/25/2020   PFIZER(Purple Top)SARS-COV-2 Vaccination 03/01/2019, 03/22/2019, 12/21/2019, 06/25/2020   Pfizer Covid-19 Vaccine Bivalent Booster 40yr & up 12/09/2020, 07/06/2021   Pneumococcal Conjugate-13 12/17/2013   Pneumococcal Polysaccharide-23 02/07/2005, 12/15/2010   Td 02/07/2002   Tdap 10/10/2012   Zoster Recombinat (Shingrix) 01/24/2020, 05/15/2020   Zoster, Live 11/27/2009    Health Maintenance  Topic Date Due   COVID-19 Vaccine (8 - Pfizer risk series) 11/14/2021 (Originally 08/31/2021)   TETANUS/TDAP  10/11/2022   COLONOSCOPY (Pts 45-46yr Insurance coverage will need to be confirmed)  09/04/2025   Pneumonia Vaccine 6562Years old  Completed   INFLUENZA VACCINE  Completed   DEXA SCAN  Completed   Hepatitis C Screening  Completed   Zoster Vaccines- Shingrix  Completed   HPV VACCINES  Aged Out       Assessment  This is a routine wellness examination for Cheryl [name].  Health Maintenance: Due or Overdue There are no preventive care reminders to display for this patient.   Cheryl [name] does not need a referral for Community Assistance: Care Management:   no Social Work:    no Prescription Assistance:  no Nutrition/Diabetes Education:  no   Plan:  Personalized Goals  Goals Addressed               This Visit's Progress     Patient Stated (pt-stated)        10/29/2021 AWV Goal: Exercise for General Health  Patient will verbalize understanding of the benefits of increased physical activity: Exercising regularly is important. It will improve your overall fitness, flexibility, and endurance. Regular exercise also will improve your overall health. It can help you control your weight, reduce stress, and improve your bone density. Over the next year, patient will increase physical activity as tolerated with a goal of at least 150 minutes of moderate physical activity per week.  You can tell that you are exercising at a moderate intensity if your heart starts beating faster and you start breathing faster but can still hold a conversation. Moderate-intensity exercise ideas include: Walking 1 mile (1.6 km) in about 15 minutes Biking Hiking Golfing Dancing Water aerobics Patient will verbalize understanding of everyday activities that increase physical activity by providing examples like the following: Yard work, such as: Sales promotion account executive Gardening Washing windows or floors Patient will be able to explain general safety guidelines  for exercising:  Before you start a new exercise program, talk with your health care provider. Do not exercise so much that you hurt yourself, feel dizzy, or get very short of breath. Wear comfortable clothes and wear shoes with good support. Drink plenty of water while you exercise to prevent dehydration or heat stroke. Work out until your breathing and your heartbeat get faster.        Personalized Health Maintenance & Screening Recommendations  There are no preventive care reminders to display for this patient.   Lung Cancer Screening Recommended: no (Low Dose CT Chest recommended if Age 44-80 years, 20 pack-year currently smoking OR have quit w/in past 15 years) Hepatitis C Screening recommended: no HIV Screening recommended: no  Advanced Directives: Written information was not prepared per patient's request.  Referrals & Orders No orders of the defined types were placed in this encounter.   Follow-up Plan Follow-up with Hali Marry, MD as planned Medicare wellness visit in one year. Patient will access AVS on my chart.   I have personally reviewed and noted the following in the patient's chart:   Medical and social history Use of alcohol, tobacco or illicit drugs  Current medications and supplements Functional ability and status Nutritional status Physical activity Advanced directives List of other physicians Hospitalizations, surgeries, and ER visits in previous 12 months Vitals Screenings to include cognitive, depression, and falls Referrals and appointments  In addition, I have reviewed and discussed with Cheryl [name] certain preventive protocols, quality metrics, and best practice recommendations. A written personalized care plan for preventive services as well as general preventive health recommendations is available and can be mailed to the patient at her request.  Tinnie Gens, RN BSN  10/29/2021

## 2021-10-29 NOTE — Patient Instructions (Addendum)
[facility] Maintenance Summary and Written Plan of Care  Ms. Cheryl [name] ,  Thank you for allowing me to perform your Medicare Annual Wellness Visit and for your ongoing commitment to your health.   Health Maintenance & Immunization History Health Maintenance  Topic Date Due  . COVID-19 Vaccine (8 - Pfizer risk series) 11/14/2021 (Originally 08/31/2021)  . TETANUS/TDAP  10/11/2022  . COLONOSCOPY (Pts 45-40yr Insurance coverage will need to be confirmed)  09/04/2025  . Pneumonia Vaccine 79 Years old  Completed  . INFLUENZA VACCINE  Completed  . DEXA SCAN  Completed  . Hepatitis C Screening  Completed  . Zoster Vaccines- Shingrix  Completed  . HPV VACCINES  Aged Out   Immunization History  Administered Date(s) Administered  . Fluad Quad(high Dose 65+) 10/04/2018, 11/19/2019, 10/22/2021  . Hep A / Hep B 12/22/2015, 01/19/2016, 06/20/2016  . Influenza Split 12/20/2011  . Influenza Whole 11/08/2004, 11/08/2005, 11/24/2006, 11/12/2007, 12/15/2008, 11/27/2009  . Influenza, High Dose Seasonal PF 09/26/2016, 12/09/2020  . Influenza,inj,Quad PF,6+ Mos 10/10/2012, 10/01/2013, 12/31/2014, 10/05/2015, 09/21/2017  . Influenza-Unspecified 09/21/2017  . PFIZER Comirnaty(Gray Top)Covid-19 Tri-Sucrose Vaccine 06/25/2020  . PFIZER(Purple Top)SARS-COV-2 Vaccination 03/01/2019, 03/22/2019, 12/21/2019, 06/25/2020  . PPension scheme manager157yr& up 12/09/2020, 07/06/2021  . Pneumococcal Conjugate-13 12/17/2013  . Pneumococcal Polysaccharide-23 02/07/2005, 12/15/2010  . Td 02/07/2002  . Tdap 10/10/2012  . Zoster Recombinat (Shingrix) 01/24/2020, 05/15/2020  . Zoster, Live 11/27/2009    These are the patient goals that we discussed:  Goals Addressed              This Visit's Progress   .  Patient Stated (pt-stated)        10/29/2021 AWV Goal: Exercise for General Health  Patient will verbalize understanding of the benefits of increased physical  activity: Exercising regularly is important. It will improve your overall fitness, flexibility, and endurance. Regular exercise also will improve your overall health. It can help you control your weight, reduce stress, and improve your bone density. Over the next year, patient will increase physical activity as tolerated with a goal of at least 150 minutes of moderate physical activity per week.  You can tell that you are exercising at a moderate intensity if your heart starts beating faster and you start breathing faster but can still hold a conversation. Moderate-intensity exercise ideas include: Walking 1 mile (1.6 km) in about 15 minutes Biking Hiking Golfing Dancing Water aerobics Patient will verbalize understanding of everyday activities that increase physical activity by providing examples like the following: Yard work, such as: PuSales promotion account executiveardening Washing windows or floors Patient will be able to explain general safety guidelines for exercising:  Before you start a new exercise program, talk with your health care provider. Do not exercise so much that you hurt yourself, feel dizzy, or get very short of breath. Wear comfortable clothes and wear shoes with good support. Drink plenty of water while you exercise to prevent dehydration or heat stroke. Work out until your breathing and your heartbeat get faster.         This is a list of Health Maintenance Items that are overdue or due now: There are no preventive care reminders to display for this patient.   Orders/Referrals Placed Today: No orders of the defined types were placed in this encounter.  (Contact our referral department at [phone] you have not spoken with someone about  your referral appointment within the next 5 days)    Follow-up Plan Follow-up with Hali Marry, MD as planned Medicare wellness visit in  one year. Patient will access AVS on my chart.      Health Maintenance, Female Adopting a healthy lifestyle and getting preventive care are important in promoting health and wellness. Ask your health care provider about: The right schedule for you to have regular tests and exams. Things you can do on your own to prevent diseases and keep yourself healthy. What should I know about diet, weight, and exercise? Eat a healthy diet  Eat a diet that includes plenty of vegetables, fruits, low-fat dairy products, and lean protein. Do not eat a lot of foods that are high in solid fats, added sugars, or sodium. Maintain a healthy weight Body mass index (BMI) is used to identify weight problems. It estimates body fat based on height and weight. Your health care provider can help determine your BMI and help you achieve or maintain a healthy weight. Get regular exercise Get regular exercise. This is one of the most important things you can do for your health. Most adults should: Exercise for at least 150 minutes each week. The exercise should increase your heart rate and make you sweat (moderate-intensity exercise). Do strengthening exercises at least twice a week. This is in addition to the moderate-intensity exercise. Spend less time sitting. Even light physical activity can be beneficial. Watch cholesterol and blood lipids Have your blood tested for lipids and cholesterol at 79 years of age, then have this test every 5 years. Have your cholesterol levels checked more often if: Your lipid or cholesterol levels are high. You are older than 79 years of age. You are at high risk for heart disease. What should I know about cancer screening? Depending on your health history and family history, you may need to have cancer screening at various ages. This may include screening for: Breast cancer. Cervical cancer. Colorectal cancer. Skin cancer. Lung cancer. What should I know about heart disease,  diabetes, and high blood pressure? Blood pressure and heart disease High blood pressure causes heart disease and increases the risk of stroke. This is more likely to develop in people who have high blood pressure readings or are overweight. Have your blood pressure checked: Every 3-5 years if you are 63-25 years of age. Every year if you are 44 years old or older. Diabetes Have regular diabetes screenings. This checks your fasting blood sugar level. Have the screening done: Once every three years after age 56 if you are at a normal weight and have a low risk for diabetes. More often and at a younger age if you are overweight or have a high risk for diabetes. What should I know about preventing infection? Hepatitis B If you have a higher risk for hepatitis B, you should be screened for this virus. Talk with your health care provider to find out if you are at risk for hepatitis B infection. Hepatitis C Testing is recommended for: Everyone born from [birth year] through 1963. Anyone with known risk factors for hepatitis C. Sexually transmitted infections (STIs) Get screened for STIs, including gonorrhea and chlamydia, if: You are sexually active and are younger than 79 years of age. You are older than 79 years of age and your health care provider tells you that you are at risk for this type of infection. Your sexual activity has changed since you were last screened, and you are at increased risk for  chlamydia or gonorrhea. Ask your health care provider if you are at risk. Ask your health care provider about whether you are at high risk for HIV. Your health care provider may recommend a prescription medicine to help prevent HIV infection. If you choose to take medicine to prevent HIV, you should first get tested for HIV. You should then be tested every 3 months for as long as you are taking the medicine. Pregnancy If you are about to stop having your period (premenopausal) and you may become pregnant,  seek counseling before you get pregnant. Take 400 to 800 micrograms (mcg) of folic acid every day if you become pregnant. Ask for birth control (contraception) if you want to prevent pregnancy. Osteoporosis and menopause Osteoporosis is a disease in which the bones lose minerals and strength with aging. This can result in bone fractures. If you are 20 years old or older, or if you are at risk for osteoporosis and fractures, ask your health care provider if you should: Be screened for bone loss. Take a calcium or vitamin D supplement to lower your risk of fractures. Be given hormone replacement therapy (HRT) to treat symptoms of menopause. Follow these instructions at home: Alcohol use Do not drink alcohol if: Your health care provider tells you not to drink. You are pregnant, may be pregnant, or are planning to become pregnant. If you drink alcohol: Limit how much you have to: 0-1 drink a day. Know how much alcohol is in your drink. In the [place] one drink equals one 12 oz bottle of beer (355 mL), one 5 oz glass of wine (148 mL), or one 1 oz glass of hard liquor (44 mL). Lifestyle Do not use any products that contain nicotine or tobacco. These products include cigarettes, chewing tobacco, and vaping devices, such as e-cigarettes. If you need help quitting, ask your health care provider. Do not use street drugs. Do not share needles. Ask your health care provider for help if you need support or information about quitting drugs. General instructions Schedule regular health, dental, and eye exams. Stay current with your vaccines. Tell your health care provider if: You often feel depressed. You have ever been abused or do not feel safe at home. Summary Adopting a healthy lifestyle and getting preventive care are important in promoting health and wellness. Follow your health care provider's instructions about healthy diet, exercising, and getting tested or screened for diseases. Follow your  health care provider's instructions on monitoring your cholesterol and blood pressure. This information is not intended to replace advice given to you by your health care provider. Make sure you discuss any questions you have with your health care provider. Document Revised: 06/15/2020 Document Reviewed: 06/15/2020 Elsevier Patient Education  [place].

## 2021-11-04 MED ORDER — PREDNISONE 20 MG PO TABS: 40.0000 mg | ORAL_TABLET | Freq: Every day | ORAL | 0 refills | Status: DC

## 2021-11-04 NOTE — Addendum Note (Signed)
Addended by: [name] D on: 11/04/2021 01:32 PM   Modules accepted: Orders

## 2021-11-20 ENCOUNTER — Encounter: Payer: Self-pay | Admitting: Family Medicine

## 2021-11-20 DIAGNOSIS — W19XXXA Unspecified fall, initial encounter: Secondary | ICD-10-CM

## 2021-11-20 DIAGNOSIS — L299 Pruritus, unspecified: Secondary | ICD-10-CM

## 2021-11-20 DIAGNOSIS — M25551 Pain in right hip: Secondary | ICD-10-CM

## 2021-11-22 MED ORDER — HYDROXYZINE PAMOATE 25 MG PO CAPS: 25.0000 mg | ORAL_CAPSULE | Freq: Two times a day (BID) | ORAL | 2 refills | Status: DC | PRN

## 2021-11-23 DIAGNOSIS — H40052 Ocular hypertension, left eye: Secondary | ICD-10-CM | POA: Diagnosis not present

## 2021-11-23 DIAGNOSIS — H52223 Regular astigmatism, bilateral: Secondary | ICD-10-CM | POA: Diagnosis not present

## 2021-11-23 DIAGNOSIS — Z961 Presence of intraocular lens: Secondary | ICD-10-CM | POA: Diagnosis not present

## 2021-11-23 DIAGNOSIS — H35372 Puckering of macula, left eye: Secondary | ICD-10-CM | POA: Diagnosis not present

## 2021-11-23 DIAGNOSIS — H40022 Open angle with borderline findings, high risk, left eye: Secondary | ICD-10-CM | POA: Diagnosis not present

## 2021-11-23 DIAGNOSIS — H16223 Keratoconjunctivitis sicca, not specified as Sjogren's, bilateral: Secondary | ICD-10-CM | POA: Diagnosis not present

## 2021-11-23 DIAGNOSIS — H524 Presbyopia: Secondary | ICD-10-CM | POA: Diagnosis not present

## 2021-11-23 NOTE — Telephone Encounter (Signed)
Please find out which hip and we can put an order in for an x-ray of that hip somewhere closer to where she lives in [place].  Probably at [facility] on [address].

## 2021-11-24 NOTE — Addendum Note (Signed)
Addended by: [name] D on: 11/24/2021 02:21 PM   Modules accepted: Orders

## 2021-11-24 NOTE — Telephone Encounter (Signed)
Orders Placed This Encounter  Procedures   DG Hip Unilat W OR W/O Pelvis 2-3 Views Right    Standing Status:   Future    Standing Expiration Date:   11/25/2022    Order Specific Question:   Reason for Exam (SYMPTOM  OR DIAGNOSIS REQUIRED)    Answer:   fall, right hip apin    Order Specific Question:   Preferred imaging location?    Answer:   [facility]   Not sure if she can just go anytime like she can hear or if she has have an appointment.  But I did place the order for [employer].

## 2021-11-25 ENCOUNTER — Other Ambulatory Visit: Payer: Self-pay | Admitting: *Deleted

## 2021-11-25 DIAGNOSIS — S3992XA Unspecified injury of lower back, initial encounter: Secondary | ICD-10-CM

## 2021-11-25 DIAGNOSIS — W19XXXA Unspecified fall, initial encounter: Secondary | ICD-10-CM

## 2021-11-26 ENCOUNTER — Encounter: Payer: Self-pay | Admitting: Family Medicine

## 2021-11-26 ENCOUNTER — Other Ambulatory Visit: Payer: Self-pay | Admitting: *Deleted

## 2021-11-26 DIAGNOSIS — B372 Candidiasis of skin and nail: Secondary | ICD-10-CM

## 2021-11-26 DIAGNOSIS — H15102 Unspecified episcleritis, left eye: Secondary | ICD-10-CM

## 2021-11-26 MED ORDER — NYSTATIN 100000 UNIT/GM EX POWD: 1.0000 | Freq: Two times a day (BID) | CUTANEOUS | 2 refills | Status: DC | PRN

## 2021-11-26 MED ORDER — CYCLOSPORINE 0.05 % OP EMUL: 1.0000 [drp] | Freq: Two times a day (BID) | OPHTHALMIC | 5 refills | Status: AC

## 2021-11-26 MED ORDER — OMEPRAZOLE 20 MG PO CPDR: 20.0000 mg | DELAYED_RELEASE_CAPSULE | Freq: Two times a day (BID) | ORAL | 3 refills | Status: DC

## 2021-12-03 ENCOUNTER — Encounter: Payer: Self-pay | Admitting: Family Medicine

## 2021-12-03 DIAGNOSIS — H8110 Benign paroxysmal vertigo, unspecified ear: Secondary | ICD-10-CM

## 2021-12-06 ENCOUNTER — Ambulatory Visit (INDEPENDENT_AMBULATORY_CARE_PROVIDER_SITE_OTHER): Payer: PPO

## 2021-12-06 DIAGNOSIS — S3992XA Unspecified injury of lower back, initial encounter: Secondary | ICD-10-CM | POA: Diagnosis not present

## 2021-12-06 DIAGNOSIS — W19XXXA Unspecified fall, initial encounter: Secondary | ICD-10-CM | POA: Diagnosis not present

## 2021-12-06 DIAGNOSIS — M545 Low back pain, unspecified: Secondary | ICD-10-CM | POA: Diagnosis not present

## 2021-12-06 DIAGNOSIS — M25551 Pain in right hip: Secondary | ICD-10-CM | POA: Diagnosis not present

## 2021-12-06 DIAGNOSIS — M4126 Other idiopathic scoliosis, lumbar region: Secondary | ICD-10-CM | POA: Diagnosis not present

## 2021-12-07 ENCOUNTER — Ambulatory Visit: Payer: PPO | Attending: Cardiovascular Disease | Admitting: Rehabilitative and Restorative Service Providers"

## 2021-12-07 ENCOUNTER — Encounter: Payer: Self-pay | Admitting: Rehabilitative and Restorative Service Providers"

## 2021-12-07 ENCOUNTER — Other Ambulatory Visit: Payer: Self-pay

## 2021-12-07 DIAGNOSIS — Z9181 History of falling: Secondary | ICD-10-CM

## 2021-12-07 DIAGNOSIS — R42 Dizziness and giddiness: Secondary | ICD-10-CM

## 2021-12-07 DIAGNOSIS — M6281 Muscle weakness (generalized): Secondary | ICD-10-CM

## 2021-12-07 DIAGNOSIS — R2689 Other abnormalities of gait and mobility: Secondary | ICD-10-CM

## 2021-12-07 DIAGNOSIS — M546 Pain in thoracic spine: Secondary | ICD-10-CM | POA: Diagnosis not present

## 2021-12-07 DIAGNOSIS — H8110 Benign paroxysmal vertigo, unspecified ear: Secondary | ICD-10-CM | POA: Insufficient documentation

## 2021-12-07 DIAGNOSIS — M545 Low back pain, unspecified: Secondary | ICD-10-CM

## 2021-12-07 DIAGNOSIS — G8929 Other chronic pain: Secondary | ICD-10-CM | POA: Insufficient documentation

## 2021-12-07 DIAGNOSIS — G4489 Other headache syndrome: Secondary | ICD-10-CM | POA: Diagnosis not present

## 2021-12-07 DIAGNOSIS — M542 Cervicalgia: Secondary | ICD-10-CM

## 2021-12-07 NOTE — Therapy (Signed)
OUTPATIENT PHYSICAL THERAPY VESTIBULAR EVALUATION     Patient Name: Cheryl [name] MRN: [id number] DOB:04-22-1940, 81 y.o., female Today's Date: 12/07/2021  PCP: Hali Marry, MD  REFERRING PROVIDER: Hali Marry, MD    PT End of Session - 12/07/21 1238     Visit Number 1    Date for PT Re-Evaluation 01/28/22    Authorization Type [employer]    Progress Note Due on Visit 10    PT Start Time 1231    PT Stop Time 1310    PT Time Calculation (min) 39 min    Activity Tolerance Patient tolerated treatment well    Behavior During Therapy WFL for tasks assessed/performed             Past Medical History:  Diagnosis Date   Anemia    Anxiety    d/t recent family issues   Arthritis    Bone spur    Carotid stenosis, bilateral 06/06/2019   Chronic back pain    stenosis and OA   Depression    takes Paxil daily   Diverticulosis    Dizzy    occasionally   Encephalitis    at 9 months ago    Fatty liver    has had 2 Hep shots and the final one is in June 18   GERD (gastroesophageal reflux disease)    takes Omeprazole daily   Hepatitis 2012   History of bronchitis    20-25 yrs ago   History of colon polyps    precancerous   History of shingles    Hyperlipidemia    takes Simvastatin daily   IBS (irritable bowel syndrome)    more on side of constipation-takes Miralax daily   Insomnia    takes Melatonin and Ambien nightly   Internal hemorrhoids    Joint pain    Joint swelling    Lower leg edema 09/21/2021   Lung nodules    calcified    Migraine headache    migraines-thinks coming from neck.Last one1/28/18   Orthostatic hypotension 09/21/2021   Pneumonia    hx of-20 yrs ago-=-walking   Renal cyst    left   Uterine cancer ([facility]) 1973   Past Surgical History:  Procedure Laterality Date   APPENDECTOMY  1966   cataracts     bilateral   COLONOSCOPY  09/04/2020   EUS  02/10/2011   Procedure: UPPER ENDOSCOPIC ULTRASOUND (EUS) RADIAL;   Surgeon: Owens Loffler, MD;  Location: [facility];  Service: Endoscopy;  Laterality: N/A;  radial linear    LUMBAR FUSION  03/2016   L4-5, Dr. Ola Spurr.   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   Dementia ([facility]) 02/15/2021   Pharyngoesophageal dysphagia 12/14/2020   Osteopenia 10/23/2020   Abnormal glucose 08/13/2020   Chronic migraine without aura, with intractable migraine, so stated, with status migrainosus 07/03/2019   Carotid stenosis, bilateral 06/06/2019   Migraine headache 03/22/2019   Daily headache 03/22/2019   Myofascial pain 03/27/2018   Numbness of left foot 11/07/2017   Right foot pain 09/12/2017  Deviated septum 01/25/2017   ETD (Eustachian tube dysfunction), bilateral 01/25/2017   Seasonal allergic rhinitis 01/25/2017   Bilateral hearing loss 01/25/2017   S/P lumbar fusion 06/20/2016   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   Fatty liver 12/22/2015   Onychodystrophy 11/24/2015   Hyperlipidemia 12/17/2013   Osteoarthritis of left knee 07/05/2013   Obesity 03/10/2011   Dyspepsia 02/10/2011   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   PALPITATIONS 02/11/2010   OSTEOPENIA 11/27/2009   CONSTIPATION, CHRONIC 08/15/2008   Insomnia 07/08/2008   Chronic neck pain 04/11/2008   Disorder of kidney and ureter 01/21/2008   HEMORRHOIDS, INTERNAL 01/16/2008   Diverticulosis of colon 01/16/2008   POSTMENOPAUSAL STATUS 12/05/2007   MICROSCOPIC HEMATURIA 06/05/2007   STENOSIS, LUMBAR SPINE 12/22/2005   Anxiety and depression 11/15/2005   IRRITABLE BOWEL SYNDROME 11/15/2005    ARTHRITIS 11/15/2005    ONSET DATE: 12/01/2021  REFERRING DIAG: H81.10 (ICD-10-CM) - Benign paroxysmal positional vertigo, unspecified laterality   THERAPY DIAG:  Dizziness and giddiness - Plan: PT plan of care cert/re-cert  Balance problem - Plan: PT plan of care cert/re-cert  Chronic bilateral low back pain without sciatica - Plan: PT plan of care cert/re-cert  Pain in thoracic spine - Plan: PT plan of care cert/re-cert  Muscle weakness (generalized) - Plan: PT plan of care cert/re-cert  History of falling - Plan: PT plan of care cert/re-cert  Cervicalgia - Plan: PT plan of care cert/re-cert  Other headache syndrome - Plan: PT plan of care cert/re-cert  Rationale for Evaluation and Treatment Rehabilitation  SUBJECTIVE:   SUBJECTIVE STATEMENT: Pt reports that middle of last week, she started noticing dizziness.  States that it is not the same as last time, and she had not had the dizziness when rolling over in bed like last time.  Pt does admit that the MD has recently changed some of her medication about 3 months ago.  Pt reports recent fall a couple of weeks ago and went for radiographs yesterday.  Pt states that she has had some increased back pain since that time.  Pt reports fall occurred when she was in the shower and was getting fatigued and was trying to rush to complete her shower.  Believes that her fatigue/weakness may be due to medication changes. Pt accompanied by: self  PERTINENT HISTORY: Dementia, Chronic Neck Pain, BPPV   PAIN:  Are you having pain? Yes: NPRS scale: 0-5/10 Pain location: along cervical to lumbar spine Pain description: soreness Aggravating factors: pain worsened from fall Relieving factors: rest  PRECAUTIONS: Fall  WEIGHT BEARING RESTRICTIONS No  FALLS: Has patient fallen in last 6 months? Yes. Number of falls 1 fall when in shower  LIVING ENVIRONMENT: Lives with: lives alone with small dog Lives in: House/apartment Stairs: Yes:  External: 1 steps; no rails at ramp currently, but getting them installed soon Has following equipment at home: Single point cane, Walker - 4 wheeled, bed side commode, and Grab bars  PLOF: Independent  PATIENT GOALS:  To not be as dizzy and feel better.  OBJECTIVE:   DIAGNOSTIC FINDINGS: n/a  COGNITION: Overall cognitive status:  Pt reports that she was doing well cognitively until the Pandemic hit and she started staying in too long, then she started having some word finding problems.  Pt now with diagnosis of dementia.   SENSATION: WFL  POSTURE: rounded shoulders and forward head   Cervical ROM:    Active A/PROM (deg) eval  Flexion 50  Extension 45  Right lateral  flexion 30  Left lateral flexion 30  Right rotation 50  Left rotation 45  (Blank rows = not tested)  STRENGTH:  12/07/2021: UE strength grossly 4 to 4+/5 throughout LE strength grossly 4/5 throughout   GAIT: Gait pattern: decreased step length- Right, decreased step length- Left, and decreased trunk rotation Distance walked: 100 ft Assistive device utilized: None Level of assistance: Complete Independence Comments:   FUNCTIONAL TESTs:  12/07/2021: 5 times sit to stand: 16 sec with UE use pushing up from chair Timed up and go (TUG): 19.5 sec with SPC  PATIENT SURVEYS:  12/07/2021: DHI Total Score: 58 / 100 Physical Score: 14 / 28 Emotional Score: 14 / 36 Functional Score: 30 / 36  VESTIBULAR ASSESSMENT   GENERAL OBSERVATION: Pt is a pleasant female with noted decreased cervical motion noted during initial evaluation    SYMPTOM BEHAVIOR:   Subjective history: Pt reports that her dizziness is not as bad as it was in June 2023 when she came in for treatment   Non-Vestibular symptoms: neck pain, nausea/vomiting, and migraine symptoms   Type of dizziness: Spinning/Vertigo   Frequency: Pt reports that the dizziness has improved some since it started last week.  No longer happening  daily   Duration: depends on the time   Aggravating factors: Induced by position change: rolling to the left and Induced by motion: occur when walking, sitting in a moving car, and activity in general   Relieving factors: head stationary, slow movements, and avoid busy/distracting environments   Progression of symptoms: better   OCULOMOTOR EXAM:   Ocular Alignment: normal   Ocular ROM: No Limitations   Spontaneous Nystagmus: absent   Gaze-Induced Nystagmus: absent   Smooth Pursuits: intact   Saccades: intact     VESTIBULAR - OCULAR REFLEX:    Positive head thrust test to bilateral sides for nystagmus    POSITIONAL TESTING:  12/07/2021: Right Dix-Hallpike: no nystagmus Left Dix-Hallpike: nystagmus noted and Duration: 20 sec    VESTIBULAR TREATMENT: 12/07/2021: Canalith Repositioning:   Epley Left: Number of Reps: 1 and Response to Treatment: symptoms improved  PATIENT EDUCATION: Education details: Provided with HEP printout Person educated: Patient Education method: Explanation and Handouts Education comprehension: verbalized understanding  HOME EXERCISE PROGRAM: Access Code: [email or web address] URL: [email or web address] Date: 12/07/2021 Prepared by: Juel Burrow  Exercises - Brandt-Daroff Vestibular Exercise  - 1 x daily - 7 x weekly - 1 sets - 5 reps - Seated Gaze Stabilization with Head Nod  - 1 x daily - 7 x weekly - 3 sets - 50 reps - Seated Gaze Stabilization with Head Rotation  - 1 x daily - 7 x weekly - 3 sets - 50 reps - Pencil Pushups  - 1 x daily - 7 x weekly - 2 sets - 10 reps - Standing Gastroc Stretch on Step with Counter Support  - 1 x daily - 7 x weekly - 1 sets - 2 reps - 20 sec hold - Walking Tandem Stance  - 1 x daily - 7 x weekly - 3 sets - 10 reps - Walking with Head Nod  - 1 x daily - 7 x weekly - 3 sets - 10 reps - Walking with Head Rotation  - 1 x daily - 7 x weekly - 3 sets - 10 reps   GOALS: Goals reviewed with patient? Yes  SHORT  TERM GOALS: Target date: 12/28/2021   Pt will be independent with initial HEP. Baseline: Goal status: INITIAL  2.  Pt will  report at least a 40% improvement in dizziness symptoms. Baseline:  Goal status: INITIAL   LONG TERM GOALS: Target date: 01/28/2022    Pt will be independent with advanced HEP. Baseline:  Goal status: INITIAL  2.  Pt will improve Dizziness Handicapped Inventory sore to less than 30/100 to indicate decreased dizziness with tasks. Baseline: 54/100 Goal status: INITIAL  3.  Pt will demonstrate negative Marye Round consistently by last PT treatment session. Baseline:  Goal status: INITIAL  4.  Pt will report at least an 80% improvement in dizziness during daily tasks and rolling over in bed. Baseline:  Goal status: INITIAL  5.  Pt will increase bilat UE/LE strength to at least 4+/5 grossly throughout to improve ability to perform transfers in home at in community. Baseline: Goal status:  INITIAL  6.  Pt will report pain no greater than 2/10 during grocery shopping and other community outings. Baseline: Goal status:  INITIAL   ASSESSMENT:  CLINICAL IMPRESSION: Patient is a 81 y.o. female who was seen today for physical therapy evaluation and treatment for BPPV/dizziness. Pts PLOF is independent and able to care for her dog and perform other tasks without dizziness.  Pt presents to PT Evaluation with a reported history of dizziness intermittently since she was in her 32s and was recently treated in June 2023 by this therapist for BPPV.  Pt reports that she was doing better and not having dizziness after that treatment, but last week, she started to notice that the symptoms were returning and she reached out to her MD for a new referral to vestibular PT.  Pt presents during PT session with signs of vestibular hypofunctioning and positive left sided Marye Round and proceeded with treatment utilizing Epley Maneuver.  Pt with some dizziness still at the end of  this session and reviewed HEP, including Nestor Lewandowsky.  Pt would benefit from skilled PT to address her functional impairments.   OBJECTIVE IMPAIRMENTS Abnormal gait, decreased balance, and dizziness.   ACTIVITY LIMITATIONS locomotion level  PARTICIPATION LIMITATIONS: community activity  PERSONAL FACTORS Age, Time since onset of injury/illness/exacerbation, and 1-2 comorbidities: Chronic Neck Pain, Dementia  are also affecting patient's functional outcome.   REHAB POTENTIAL: Good  CLINICAL DECISION MAKING: Evolving/moderate complexity  EVALUATION COMPLEXITY: Moderate   PLAN: PT FREQUENCY: 2x/week  PT DURATION: 8 weeks  PLANNED INTERVENTIONS: Therapeutic exercises, Therapeutic activity, Neuromuscular re-education, Balance training, Gait training, Patient/Family education, Joint manipulation, Joint mobilization, Stair training, Vestibular training, Canalith repositioning, Aquatic Therapy, Dry Needling, Electrical stimulation, Spinal manipulation, Spinal mobilization, Cryotherapy, Moist heat, Taping, Traction, Manual therapy, and Re-evaluation  PLAN FOR NEXT SESSION: Marye Round and Canalith repositioning as needed, vestibular rehab, balance   Juel Burrow, PT 12/07/2021, 3:13 PM   [facility] [address] [place],  [address] Phone # [phone] Fax [phone]

## 2021-12-08 NOTE — Progress Notes (Signed)
Hi Darci,  no sign of fracture of the hips which is great and reassuring.  Most likely just soft tissue injury.

## 2021-12-08 NOTE — Progress Notes (Signed)
HI Cheryl [name], you do have some arthritis at L3-4 and L5-S1.  This is beside the prior fusion at L4-5.  But no sign of fracture which is reassuring.

## 2021-12-09 ENCOUNTER — Encounter: Payer: Self-pay | Admitting: Rehabilitative and Restorative Service Providers"

## 2021-12-09 ENCOUNTER — Ambulatory Visit: Payer: PPO | Attending: Family Medicine | Admitting: Rehabilitative and Restorative Service Providers"

## 2021-12-09 DIAGNOSIS — M542 Cervicalgia: Secondary | ICD-10-CM

## 2021-12-09 DIAGNOSIS — G4489 Other headache syndrome: Secondary | ICD-10-CM

## 2021-12-09 DIAGNOSIS — R42 Dizziness and giddiness: Secondary | ICD-10-CM

## 2021-12-09 DIAGNOSIS — H8110 Benign paroxysmal vertigo, unspecified ear: Secondary | ICD-10-CM | POA: Diagnosis not present

## 2021-12-09 DIAGNOSIS — M545 Low back pain, unspecified: Secondary | ICD-10-CM

## 2021-12-09 DIAGNOSIS — Z9181 History of falling: Secondary | ICD-10-CM | POA: Diagnosis not present

## 2021-12-09 DIAGNOSIS — R2689 Other abnormalities of gait and mobility: Secondary | ICD-10-CM

## 2021-12-09 DIAGNOSIS — M546 Pain in thoracic spine: Secondary | ICD-10-CM

## 2021-12-09 DIAGNOSIS — M6281 Muscle weakness (generalized): Secondary | ICD-10-CM | POA: Diagnosis not present

## 2021-12-09 NOTE — Patient Instructions (Signed)

## 2021-12-09 NOTE — Therapy (Signed)
OUTPATIENT PHYSICAL THERAPY TREATMENT NOTE     Patient Name: Cheryl [name] MRN: [id number] DOB:Jun 08, 1940, 81 y.o., female Today's Date: 12/09/2021  PCP: Hali Marry, MD  REFERRING PROVIDER: Hali Marry, MD    PT End of Session - 12/09/21 0810     Visit Number 2    Date for PT Re-Evaluation 01/28/22    Authorization Type [employer]    Progress Note Due on Visit 10    PT Start Time 0800    PT Stop Time 0840    PT Time Calculation (min) 40 min    Activity Tolerance Patient tolerated treatment well    Behavior During Therapy [facility] for tasks assessed/performed             Past Medical History:  Diagnosis Date   Anemia    Anxiety    d/t recent family issues   Arthritis    Bone spur    Carotid stenosis, bilateral 06/06/2019   Chronic back pain    stenosis and OA   Depression    takes Paxil daily   Diverticulosis    Dizzy    occasionally   Encephalitis    at 9 months ago    Fatty liver    has had 2 Hep shots and the final one is in June 18   GERD (gastroesophageal reflux disease)    takes Omeprazole daily   Hepatitis 2012   History of bronchitis    20-25 yrs ago   History of colon polyps    precancerous   History of shingles    Hyperlipidemia    takes Simvastatin daily   IBS (irritable bowel syndrome)    more on side of constipation-takes Miralax daily   Insomnia    takes Melatonin and Ambien nightly   Internal hemorrhoids    Joint pain    Joint swelling    Lower leg edema 09/21/2021   Lung nodules    calcified    Migraine headache    migraines-thinks coming from neck.Last one1/28/18   Orthostatic hypotension 09/21/2021   Pneumonia    hx of-20 yrs ago-=-walking   Renal cyst    left   Uterine cancer ([place]) 1973   Past Surgical History:  Procedure Laterality Date   APPENDECTOMY  1966   cataracts     bilateral   COLONOSCOPY  09/04/2020   EUS  02/10/2011   Procedure: UPPER ENDOSCOPIC ULTRASOUND (EUS) RADIAL;  Surgeon:  Owens Loffler, MD;  Location: [facility];  Service: Endoscopy;  Laterality: N/A;  radial linear    LUMBAR FUSION  03/2016   L4-5, Dr. Ola Spurr.   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   Dementia ([place]) 02/15/2021   Pharyngoesophageal dysphagia 12/14/2020   Osteopenia 10/23/2020   Abnormal glucose 08/13/2020   Chronic migraine without aura, with intractable migraine, so stated, with status migrainosus 07/03/2019   Carotid stenosis, bilateral 06/06/2019   Migraine headache 03/22/2019   Daily headache 03/22/2019   Myofascial pain 03/27/2018   Numbness of left foot 11/07/2017   Right foot pain 09/12/2017  Deviated septum 01/25/2017   ETD (Eustachian tube dysfunction), bilateral 01/25/2017   Seasonal allergic rhinitis 01/25/2017   Bilateral hearing loss 01/25/2017   S/P lumbar fusion 06/20/2016   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   Fatty liver 12/22/2015   Onychodystrophy 11/24/2015   Hyperlipidemia 12/17/2013   Osteoarthritis of left knee 07/05/2013   Obesity 03/10/2011   Dyspepsia 02/10/2011   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   PALPITATIONS 02/11/2010   OSTEOPENIA 11/27/2009   CONSTIPATION, CHRONIC 08/15/2008   Insomnia 07/08/2008   Chronic neck pain 04/11/2008   Disorder of kidney and ureter 01/21/2008   HEMORRHOIDS, INTERNAL 01/16/2008   Diverticulosis of colon 01/16/2008   POSTMENOPAUSAL STATUS 12/05/2007   MICROSCOPIC HEMATURIA 06/05/2007   STENOSIS, LUMBAR SPINE 12/22/2005   Anxiety and depression 11/15/2005   IRRITABLE BOWEL SYNDROME 11/15/2005    ARTHRITIS 11/15/2005    ONSET DATE: 12/01/2021  REFERRING DIAG: H81.10 (ICD-10-CM) - Benign paroxysmal positional vertigo, unspecified laterality   THERAPY DIAG:  Dizziness and giddiness  Balance problem  Chronic bilateral low back pain without sciatica  Pain in thoracic spine  Muscle weakness (generalized)  History of falling  Cervicalgia  Other headache syndrome  Rationale for Evaluation and Treatment Rehabilitation  SUBJECTIVE:   SUBJECTIVE STATEMENT: Pt reports that her dizziness is some better today.  States that she is having some pressure in her head and feeling some neck popping with moving. Pt accompanied by: self  PERTINENT HISTORY: Dementia, Chronic Neck Pain, BPPV   PAIN:  Are you having pain? Yes: NPRS scale: 4/10 Pain location: along cervical to lumbar spine Pain description: soreness Aggravating factors: pain worsened from fall Relieving factors: rest  PRECAUTIONS: Fall  WEIGHT BEARING RESTRICTIONS No  FALLS: Has patient fallen in last 6 months? Yes. Number of falls 1 fall when in shower  LIVING ENVIRONMENT: Lives with: lives alone with small dog Lives in: House/apartment Stairs: Yes: External: 1 steps; no rails at ramp currently, but getting them installed soon Has following equipment at home: Single point cane, Walker - 4 wheeled, bed side commode, and Grab bars  PLOF: Independent  PATIENT GOALS:  To not be as dizzy and feel better.  OBJECTIVE:   DIAGNOSTIC FINDINGS:  Lumbar Radiograph on 12/06/2021: IMPRESSION: 1. Stable spondylosis and facet hypertrophy at the L3-4 and L5-S1 levels, abutting the chronic interbody fusion at L4-5. 2. No acute bony abnormality. 3. Minimal left convex lumbar scoliosis.  Right Hip Radiograph on 12/06/2021: IMPRESSION: 1. Symmetrical bilateral hip osteoarthritis.  No acute fracture.  COGNITION: Overall cognitive status:  Pt reports that she was doing well cognitively until the Pandemic hit and she  started staying in too long, then she started having some word finding problems.  Pt now with diagnosis of dementia.   SENSATION: WFL  POSTURE: rounded shoulders and forward head   Cervical ROM:    Active A/PROM (deg) eval  Flexion 50  Extension 45  Right lateral flexion 30  Left lateral flexion 30  Right rotation 50  Left rotation 45  (Blank rows = not tested)  STRENGTH:  12/07/2021: UE strength grossly 4 to 4+/5 throughout LE strength grossly 4/5 throughout   GAIT: Gait pattern: decreased step length- Right, decreased step length- Left, and decreased trunk rotation Distance walked: 100 ft Assistive device utilized: None Level of assistance: Complete Independence Comments:   FUNCTIONAL TESTs:  12/07/2021: 5 times sit to stand: 16 sec with UE use pushing up from chair Timed up and go (TUG): 19.5 sec with SPC  PATIENT SURVEYS:  12/07/2021: [place] Total Score: 58 / 100 Physical Score: 14 / 28 Emotional Score: 14 / 36 Functional Score: 30 / 36  VESTIBULAR ASSESSMENT   GENERAL OBSERVATION: Pt is a pleasant female with noted decreased cervical motion noted during initial evaluation    SYMPTOM BEHAVIOR:   Subjective history: Pt reports that her dizziness is not as bad as it was in June 2023 when she came in for treatment   Non-Vestibular symptoms: neck pain, nausea/vomiting, and migraine symptoms   Type of dizziness: Spinning/Vertigo   Frequency: Pt reports that the dizziness has improved some since it started last week.  No longer happening daily   Duration: depends on the time   Aggravating factors: Induced by position change: rolling to the left and Induced by motion: occur when walking, sitting in a moving car, and activity in general   Relieving factors: head stationary, slow movements, and avoid busy/distracting environments   Progression of symptoms: better   OCULOMOTOR EXAM:   Ocular Alignment: normal   Ocular ROM: No Limitations   Spontaneous Nystagmus:  absent   Gaze-Induced Nystagmus: absent   Smooth Pursuits: intact   Saccades: intact     VESTIBULAR - OCULAR REFLEX:    Positive head thrust test to bilateral sides for nystagmus    POSITIONAL TESTING:  12/07/2021: Right Dix-Hallpike: no nystagmus Left Dix-Hallpike: nystagmus noted and Duration: 20 sec    VESTIBULAR TREATMENT: 12/09/2021: Miguel Dibble Hallpike slight positive on left, proceeded to treatment with Epley Maneuver x1. Marye Round with strong positive nystagmus on right side, proceeded to treat with Epley Maneuver x2 Trigger Point Dry-Needling  Treatment instructions: Expect mild to moderate muscle soreness. S/S of pneumothorax if dry needled over a lung field, and to seek immediate medical attention should they occur. Patient verbalized understanding of these instructions and education. Patient Consent Given: Yes Education handout provided: Yes Muscles treated: bilateral temporalis Electrical stimulation performed: No Parameters: N/A Treatment response/outcome: Utilized skilled palpation to locate trigger points, able to palpate muscle elongation following Ambulation around PT gym without SPC without a loss of balance.   12/07/2021: Canalith Repositioning:   Epley Left: Number of Reps: 1 and Response to Treatment: symptoms improved  PATIENT EDUCATION: Education details: Provided with HEP printout Person educated: Patient Education method: Explanation and Handouts Education comprehension: verbalized understanding  HOME EXERCISE PROGRAM: Access Code: [email or web address] URL: [email or web address] Date: 12/07/2021 Prepared by: Juel Burrow  Exercises - Brandt-Daroff Vestibular Exercise  - 1 x daily - 7 x weekly - 1 sets - 5 reps - Seated Gaze Stabilization with Head Nod  - 1 x daily - 7 x weekly - 3 sets - 50 reps - Seated Gaze Stabilization with Head Rotation  - 1 x daily - 7 x weekly - 3 sets - 50 reps - Pencil Pushups  - 1 x daily - 7 x weekly - 2 sets - 10  reps - Standing Gastroc Stretch on Step with Counter Support  - 1 x daily - 7 x weekly - 1 sets - 2 reps - 20 sec hold - Walking Tandem Stance  - 1 x daily - 7 x weekly - 3 sets - 10 reps - Walking with Head Nod  - 1 x daily - 7 x weekly - 3 sets - 10 reps - Walking with Head Rotation  - 1 x daily - 7 x weekly - 3 sets - 10 reps   GOALS: Goals reviewed with patient? Yes  SHORT TERM GOALS: Target date: 12/28/2021  Pt will be independent with initial HEP. Baseline: Goal status: IN PROGRESS  2.  Pt will report at least a 40% improvement in dizziness symptoms. Baseline:  Goal status: INITIAL   LONG TERM GOALS: Target date: 01/28/2022    Pt will be independent with advanced HEP. Baseline:  Goal status: INITIAL  2.  Pt will improve Dizziness Handicapped Inventory sore to less than 30/100 to indicate decreased dizziness with tasks. Baseline: 54/100 Goal status: INITIAL  3.  Pt will demonstrate negative Marye Round consistently by last PT treatment session. Baseline:  Goal status: INITIAL  4.  Pt will report at least an 80% improvement in dizziness during daily tasks and rolling over in bed. Baseline:  Goal status: INITIAL  5.  Pt will increase bilat UE/LE strength to at least 4+/5 grossly throughout to improve ability to perform transfers in home at in community. Baseline: Goal status:  INITIAL  6.  Pt will report pain no greater than 2/10 during grocery shopping and other community outings. Baseline: Goal status:  INITIAL   ASSESSMENT:  CLINICAL IMPRESSION: Ms Lumb presents to skilled PT reporting some improvements.  Marye Round today only had a slight positive for dizziness on the left side, so proceeded with treatment.  When went to supine to perform dry needling, pt reported continued dizziness, so proceeded to assessment of Marye Round on right side and noted increased nystagmus and proceeded to canalith repositioning with Epley Maneuver.  Pt continues to  require skilled PT to progress towards goal related activities.   OBJECTIVE IMPAIRMENTS Abnormal gait, decreased balance, and dizziness.   ACTIVITY LIMITATIONS locomotion level  PARTICIPATION LIMITATIONS: community activity  PERSONAL FACTORS Age, Time since onset of injury/illness/exacerbation, and 1-2 comorbidities: Chronic Neck Pain, Dementia  are also affecting patient's functional outcome.   REHAB POTENTIAL: Good  CLINICAL DECISION MAKING: Evolving/moderate complexity  EVALUATION COMPLEXITY: Moderate   PLAN: PT FREQUENCY: 2x/week  PT DURATION: 8 weeks  PLANNED INTERVENTIONS: Therapeutic exercises, Therapeutic activity, Neuromuscular re-education, Balance training, Gait training, Patient/Family education, Joint manipulation, Joint mobilization, Stair training, Vestibular training, Canalith repositioning, Aquatic Therapy, Dry Needling, Electrical stimulation, Spinal manipulation, Spinal mobilization, Cryotherapy, Moist heat, Taping, Traction, Manual therapy, and Re-evaluation  PLAN FOR NEXT SESSION: Marye Round and Canalith repositioning as needed, vestibular rehab, balance   Juel Burrow, PT 12/09/2021, 8:46 AM   [facility] [address], [place] [place], [place] [address] Phone # [phone] Fax [phone]

## 2021-12-13 ENCOUNTER — Other Ambulatory Visit: Payer: Self-pay | Admitting: Family Medicine

## 2021-12-15 ENCOUNTER — Ambulatory Visit: Payer: PPO | Admitting: Rehabilitative and Restorative Service Providers"

## 2021-12-15 ENCOUNTER — Encounter: Payer: Self-pay | Admitting: Rehabilitative and Restorative Service Providers"

## 2021-12-15 DIAGNOSIS — M6281 Muscle weakness (generalized): Secondary | ICD-10-CM

## 2021-12-15 DIAGNOSIS — G4489 Other headache syndrome: Secondary | ICD-10-CM

## 2021-12-15 DIAGNOSIS — M545 Low back pain, unspecified: Secondary | ICD-10-CM

## 2021-12-15 DIAGNOSIS — Z9181 History of falling: Secondary | ICD-10-CM

## 2021-12-15 DIAGNOSIS — R2689 Other abnormalities of gait and mobility: Secondary | ICD-10-CM

## 2021-12-15 DIAGNOSIS — M542 Cervicalgia: Secondary | ICD-10-CM

## 2021-12-15 DIAGNOSIS — M546 Pain in thoracic spine: Secondary | ICD-10-CM

## 2021-12-15 DIAGNOSIS — G8929 Other chronic pain: Secondary | ICD-10-CM

## 2021-12-15 DIAGNOSIS — R42 Dizziness and giddiness: Secondary | ICD-10-CM

## 2021-12-15 DIAGNOSIS — H8110 Benign paroxysmal vertigo, unspecified ear: Secondary | ICD-10-CM | POA: Diagnosis not present

## 2021-12-15 NOTE — Therapy (Signed)
OUTPATIENT PHYSICAL THERAPY TREATMENT NOTE     Patient Name: Cheryl [name] MRN: [id number] DOB:February 05, 1941, 81 y.o., female Today's Date: 12/15/2021  PCP: Hali Marry, MD  REFERRING PROVIDER: Hali Marry, MD    PT End of Session - 12/15/21 0855     Visit Number 3    Date for PT Re-Evaluation 01/28/22    Authorization Type [employer]    Progress Note Due on Visit 10    PT Start Time 0849    PT Stop Time 0927    PT Time Calculation (min) 38 min    Activity Tolerance Patient tolerated treatment well    Behavior During Therapy [facility] for tasks assessed/performed             Past Medical History:  Diagnosis Date   Anemia    Anxiety    d/t recent family issues   Arthritis    Bone spur    Carotid stenosis, bilateral 06/06/2019   Chronic back pain    stenosis and OA   Depression    takes Paxil daily   Diverticulosis    Dizzy    occasionally   Encephalitis    at 9 months ago    Fatty liver    has had 2 Hep shots and the final one is in June 18   GERD (gastroesophageal reflux disease)    takes Omeprazole daily   Hepatitis 2012   History of bronchitis    20-25 yrs ago   History of colon polyps    precancerous   History of shingles    Hyperlipidemia    takes Simvastatin daily   IBS (irritable bowel syndrome)    more on side of constipation-takes Miralax daily   Insomnia    takes Melatonin and Ambien nightly   Internal hemorrhoids    Joint pain    Joint swelling    Lower leg edema 09/21/2021   Lung nodules    calcified    Migraine headache    migraines-thinks coming from neck.Last one1/28/18   Orthostatic hypotension 09/21/2021   Pneumonia    hx of-20 yrs ago-=-walking   Renal cyst    left   Uterine cancer ([place]) 1973   Past Surgical History:  Procedure Laterality Date   APPENDECTOMY  1966   cataracts     bilateral   COLONOSCOPY  09/04/2020   EUS  02/10/2011   Procedure: UPPER ENDOSCOPIC ULTRASOUND (EUS) RADIAL;  Surgeon:  Owens Loffler, MD;  Location: [facility];  Service: Endoscopy;  Laterality: N/A;  radial linear    LUMBAR FUSION  03/2016   L4-5, Dr. Ola Spurr.   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   Dementia ([place]) 02/15/2021   Pharyngoesophageal dysphagia 12/14/2020   Osteopenia 10/23/2020   Abnormal glucose 08/13/2020   Chronic migraine without aura, with intractable migraine, so stated, with status migrainosus 07/03/2019   Carotid stenosis, bilateral 06/06/2019   Migraine headache 03/22/2019   Daily headache 03/22/2019   Myofascial pain 03/27/2018   Numbness of left foot 11/07/2017   Right foot pain 09/12/2017  Deviated septum 01/25/2017   ETD (Eustachian tube dysfunction), bilateral 01/25/2017   Seasonal allergic rhinitis 01/25/2017   Bilateral hearing loss 01/25/2017   S/P lumbar fusion 06/20/2016   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   Fatty liver 12/22/2015   Onychodystrophy 11/24/2015   Hyperlipidemia 12/17/2013   Osteoarthritis of left knee 07/05/2013   Obesity 03/10/2011   Dyspepsia 02/10/2011   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   PALPITATIONS 02/11/2010   OSTEOPENIA 11/27/2009   CONSTIPATION, CHRONIC 08/15/2008   Insomnia 07/08/2008   Chronic neck pain 04/11/2008   Disorder of kidney and ureter 01/21/2008   HEMORRHOIDS, INTERNAL 01/16/2008   Diverticulosis of colon 01/16/2008   POSTMENOPAUSAL STATUS 12/05/2007   MICROSCOPIC HEMATURIA 06/05/2007   STENOSIS, LUMBAR SPINE 12/22/2005   Anxiety and depression 11/15/2005   IRRITABLE BOWEL SYNDROME 11/15/2005    ARTHRITIS 11/15/2005    ONSET DATE: 12/01/2021  REFERRING DIAG: H81.10 (ICD-10-CM) - Benign paroxysmal positional vertigo, unspecified laterality   THERAPY DIAG:  Dizziness and giddiness  Balance problem  Chronic bilateral low back pain without sciatica  Pain in thoracic spine  Muscle weakness (generalized)  History of falling  Cervicalgia  Other headache syndrome  Rationale for Evaluation and Treatment Rehabilitation  SUBJECTIVE:   SUBJECTIVE STATEMENT: Pt reports that she continues to have some dizziness, she asks that we review Nestor Lewandowsky exercises today.  Pt reports that she believes she may have had some relief with the tension in her head following dry needling.    Pt accompanied by: self  PERTINENT HISTORY: Dementia, Chronic Neck Pain, BPPV   PAIN:  Are you having pain? Yes: NPRS scale: 3-4/10 Pain location: along cervical to lumbar spine Pain description: soreness Aggravating factors: pain worsened from fall Relieving factors: rest  PRECAUTIONS: Fall  WEIGHT BEARING RESTRICTIONS No  FALLS: Has patient fallen in last 6 months? Yes. Number of falls 1 fall when in shower  LIVING ENVIRONMENT: Lives with: lives alone with small dog Lives in: House/apartment Stairs: Yes: External: 1 steps; no rails at ramp currently, but getting them installed soon Has following equipment at home: Single point cane, Walker - 4 wheeled, bed side commode, and Grab bars  PLOF: Independent  PATIENT GOALS:  To not be as dizzy and feel better.  OBJECTIVE:   DIAGNOSTIC FINDINGS:  Lumbar Radiograph on 12/06/2021: IMPRESSION: 1. Stable spondylosis and facet hypertrophy at the L3-4 and L5-S1 levels, abutting the chronic interbody fusion at L4-5. 2. No acute bony abnormality. 3. Minimal left convex lumbar scoliosis.  Right Hip Radiograph on 12/06/2021: IMPRESSION: 1. Symmetrical bilateral hip osteoarthritis.  No acute fracture.  COGNITION: Overall cognitive status:   Pt reports that she was doing well cognitively until the Pandemic hit and she started staying in too long, then she started having some word finding problems.  Pt now with diagnosis of dementia.   SENSATION: WFL  POSTURE: rounded shoulders and forward head   Cervical ROM:    Active A/PROM (deg) eval  Flexion 50  Extension 45  Right lateral flexion 30  Left lateral flexion 30  Right rotation 50  Left rotation 45  (Blank rows = not tested)  STRENGTH:  12/07/2021: UE strength grossly 4 to 4+/5 throughout LE strength grossly 4/5 throughout   GAIT: Gait pattern: decreased step length- Right, decreased step length- Left, and decreased trunk rotation Distance walked: 100 ft Assistive device utilized: None Level of assistance: Complete Independence Comments:   FUNCTIONAL TESTs:  12/07/2021: 5 times sit to stand: 16 sec with  UE use pushing up from chair Timed up and go (TUG): 19.5 sec with SPC  PATIENT SURVEYS:  12/07/2021: DHI Total Score: 58 / 100 Physical Score: 14 / 28 Emotional Score: 14 / 36 Functional Score: 30 / 36  VESTIBULAR ASSESSMENT   GENERAL OBSERVATION: Pt is a pleasant female with noted decreased cervical motion noted during initial evaluation    SYMPTOM BEHAVIOR:   Subjective history: Pt reports that her dizziness is not as bad as it was in June 2023 when she came in for treatment   Non-Vestibular symptoms: neck pain, nausea/vomiting, and migraine symptoms   Type of dizziness: Spinning/Vertigo   Frequency: Pt reports that the dizziness has improved some since it started last week.  No longer happening daily   Duration: depends on the time   Aggravating factors: Induced by position change: rolling to the left and Induced by motion: occur when walking, sitting in a moving car, and activity in general   Relieving factors: head stationary, slow movements, and avoid busy/distracting environments   Progression of symptoms: better   OCULOMOTOR  EXAM:   Ocular Alignment: normal   Ocular ROM: No Limitations   Spontaneous Nystagmus: absent   Gaze-Induced Nystagmus: absent   Smooth Pursuits: intact   Saccades: intact     VESTIBULAR - OCULAR REFLEX:    Positive head thrust test to bilateral sides for nystagmus    POSITIONAL TESTING:  12/07/2021: Right Dix-Hallpike: no nystagmus Left Dix-Hallpike: nystagmus noted and Duration: 20 sec    VESTIBULAR TREATMENT: 12/15/2021: Laruth Bouchard Daroff x1 each direction, pt educated to stay in the position until the dizziness completely subsides  Dix Hallpike negative on the right.  Dix Hallpike positive on the right.  Proceeded with treatment with Epley Maneuver x2 with only minimal symptoms noted on the second maneuver. Standing on compliant surface with eyes closed 3x20 sec Ambulation around PT gym without SPC without a loss of balance.   12/09/2021: Dix Hallpike slight positive on left, proceeded to treatment with Epley Maneuver x1. Marye Round with strong positive nystagmus on right side, proceeded to treat with Epley Maneuver x2 Trigger Point Dry-Needling  Treatment instructions: Expect mild to moderate muscle soreness. S/S of pneumothorax if dry needled over a lung field, and to seek immediate medical attention should they occur. Patient verbalized understanding of these instructions and education. Patient Consent Given: Yes Education handout provided: Yes Muscles treated: bilateral temporalis Electrical stimulation performed: No Parameters: N/A Treatment response/outcome: Utilized skilled palpation to locate trigger points, able to palpate muscle elongation following Ambulation around PT gym without SPC without a loss of balance.   12/07/2021: Canalith Repositioning:   Epley Left: Number of Reps: 1 and Response to Treatment: symptoms improved  PATIENT EDUCATION: Education details: Provided with HEP printout Person educated: Patient Education method: Explanation and  Handouts Education comprehension: verbalized understanding  HOME EXERCISE PROGRAM: Access Code: [email or web address] URL: [email or web address] Date: 12/07/2021 Prepared by: Juel Burrow  Exercises - Brandt-Daroff Vestibular Exercise  - 1 x daily - 7 x weekly - 1 sets - 5 reps - Seated Gaze Stabilization with Head Nod  - 1 x daily - 7 x weekly - 3 sets - 50 reps - Seated Gaze Stabilization with Head Rotation  - 1 x daily - 7 x weekly - 3 sets - 50 reps - Pencil Pushups  - 1 x daily - 7 x weekly - 2 sets - 10 reps - Standing Gastroc Stretch on Step with Counter Support  - 1 x daily - 7  x weekly - 1 sets - 2 reps - 20 sec hold - Walking Tandem Stance  - 1 x daily - 7 x weekly - 3 sets - 10 reps - Walking with Head Nod  - 1 x daily - 7 x weekly - 3 sets - 10 reps - Walking with Head Rotation  - 1 x daily - 7 x weekly - 3 sets - 10 reps   GOALS: Goals reviewed with patient? Yes  SHORT TERM GOALS: Target date: 12/28/2021   Pt will be independent with initial HEP. Baseline: Goal status: IN PROGRESS  2.  Pt will report at least a 40% improvement in dizziness symptoms. Baseline: Reports 20% improvement on 12/15/2021 Goal status: IN PROGRESS   LONG TERM GOALS: Target date: 01/28/2022    Pt will be independent with advanced HEP. Baseline:  Goal status: INITIAL  2.  Pt will improve Dizziness Handicapped Inventory sore to less than 30/100 to indicate decreased dizziness with tasks. Baseline: 54/100 Goal status: INITIAL  3.  Pt will demonstrate negative Marye Round consistently by last PT treatment session. Baseline:  Goal status: INITIAL  4.  Pt will report at least an 80% improvement in dizziness during daily tasks and rolling over in bed. Baseline:  Goal status: INITIAL  5.  Pt will increase bilat UE/LE strength to at least 4+/5 grossly throughout to improve ability to perform transfers in home at in community. Baseline: Goal status:  INITIAL  6.  Pt will report pain  no greater than 2/10 during grocery shopping and other community outings. Baseline: Goal status:  INITIAL   ASSESSMENT:  CLINICAL IMPRESSION: Cheryl [name] presents to skilled PT reporting at least 20% improvement since initial evaluation.  Pt continues to have positive right [facility], but the nystagmus was not as severe as it was at last visit.  Pt continues to require skilled PT to progress towards goal related activities.   OBJECTIVE IMPAIRMENTS Abnormal gait, decreased balance, and dizziness.   ACTIVITY LIMITATIONS locomotion level  PARTICIPATION LIMITATIONS: community activity  PERSONAL FACTORS Age, Time since onset of injury/illness/exacerbation, and 1-2 comorbidities: Chronic Neck Pain, Dementia  are also affecting patient's functional outcome.   REHAB POTENTIAL: Good  CLINICAL DECISION MAKING: Evolving/moderate complexity  EVALUATION COMPLEXITY: Moderate   PLAN: PT FREQUENCY: 2x/week  PT DURATION: 8 weeks  PLANNED INTERVENTIONS: Therapeutic exercises, Therapeutic activity, Neuromuscular re-education, Balance training, Gait training, Patient/Family education, Joint manipulation, Joint mobilization, Stair training, Vestibular training, Canalith repositioning, Aquatic Therapy, Dry Needling, Electrical stimulation, Spinal manipulation, Spinal mobilization, Cryotherapy, Moist heat, Taping, Traction, Manual therapy, and Re-evaluation  PLAN FOR NEXT SESSION: Marye Round and Canalith repositioning as needed, vestibular rehab, balance   Juel Burrow, PT 12/15/2021, 9:29 AM   [facility] [address], [place] [address] [place], [place] [address] Phone # [phone] Fax [phone]

## 2021-12-17 ENCOUNTER — Other Ambulatory Visit: Payer: Self-pay | Admitting: Family Medicine

## 2021-12-17 DIAGNOSIS — L299 Pruritus, unspecified: Secondary | ICD-10-CM

## 2021-12-21 ENCOUNTER — Encounter: Payer: PPO | Admitting: Rehabilitative and Restorative Service Providers"

## 2021-12-22 ENCOUNTER — Telehealth: Payer: Self-pay | Admitting: *Deleted

## 2021-12-22 DIAGNOSIS — Z87440 Personal history of urinary (tract) infections: Secondary | ICD-10-CM | POA: Diagnosis not present

## 2021-12-22 DIAGNOSIS — N390 Urinary tract infection, site not specified: Secondary | ICD-10-CM | POA: Diagnosis not present

## 2021-12-22 NOTE — Telephone Encounter (Signed)
Pt called stating that she had sent a my chart message about waking up this morning with UTI sxs. She wanted to know if Dr. Madilyn Fireman would send in something for this. I informed her that she would need be seen for this. She stated that her daughter [name] wasn't home. I advised her that she can do an E-visit thru my chart. She stated that she didn't feel comfortable with that. She asked if she could come by and do a urine sample. I told her that she could. She denies any f/s/c/n/v/d. No abd,back or flank pain.  She has taken AZO.   Order for urine placed.

## 2021-12-23 ENCOUNTER — Other Ambulatory Visit: Payer: Self-pay | Admitting: Family Medicine

## 2021-12-23 MED ORDER — AMOXICILLIN-POT CLAVULANATE 875-125 MG PO TABS: 1.0000 | ORAL_TABLET | Freq: Two times a day (BID) | ORAL | 0 refills | Status: DC

## 2021-12-23 NOTE — Progress Notes (Signed)
Hi Charleen, dipstick looks positive.  I am get a go ahead and send over an antibiotic.  We will let you know when the final culture comes in.

## 2021-12-24 ENCOUNTER — Ambulatory Visit: Payer: PPO | Admitting: Rehabilitative and Restorative Service Providers"

## 2021-12-24 ENCOUNTER — Encounter: Payer: Self-pay | Admitting: Rehabilitative and Restorative Service Providers"

## 2021-12-24 DIAGNOSIS — M546 Pain in thoracic spine: Secondary | ICD-10-CM

## 2021-12-24 DIAGNOSIS — Z9181 History of falling: Secondary | ICD-10-CM

## 2021-12-24 DIAGNOSIS — H8110 Benign paroxysmal vertigo, unspecified ear: Secondary | ICD-10-CM | POA: Diagnosis not present

## 2021-12-24 DIAGNOSIS — G8929 Other chronic pain: Secondary | ICD-10-CM

## 2021-12-24 DIAGNOSIS — R42 Dizziness and giddiness: Secondary | ICD-10-CM

## 2021-12-24 DIAGNOSIS — M542 Cervicalgia: Secondary | ICD-10-CM

## 2021-12-24 DIAGNOSIS — G4489 Other headache syndrome: Secondary | ICD-10-CM

## 2021-12-24 DIAGNOSIS — M545 Low back pain, unspecified: Secondary | ICD-10-CM

## 2021-12-24 DIAGNOSIS — R2689 Other abnormalities of gait and mobility: Secondary | ICD-10-CM

## 2021-12-24 DIAGNOSIS — M6281 Muscle weakness (generalized): Secondary | ICD-10-CM

## 2021-12-24 NOTE — Therapy (Signed)
OUTPATIENT PHYSICAL THERAPY TREATMENT NOTE     Patient Name: Cheryl [name] MRN: [id number] DOB:1940/12/12, 81 y.o., female Today's Date: 12/24/2021  PCP: Hali Marry, MD  REFERRING PROVIDER: Hali Marry, MD    PT End of Session - 12/24/21 0934     Visit Number 4    Date for PT Re-Evaluation 01/28/22    Authorization Type [employer]    Progress Note Due on Visit 10    PT Start Time 0930    PT Stop Time 1010    PT Time Calculation (min) 40 min    Activity Tolerance Patient tolerated treatment well    Behavior During Therapy [facility] for tasks assessed/performed             Past Medical History:  Diagnosis Date   Anemia    Anxiety    d/t recent family issues   Arthritis    Bone spur    Carotid stenosis, bilateral 06/06/2019   Chronic back pain    stenosis and OA   Depression    takes Paxil daily   Diverticulosis    Dizzy    occasionally   Encephalitis    at 9 months ago    Fatty liver    has had 2 Hep shots and the final one is in June 18   GERD (gastroesophageal reflux disease)    takes Omeprazole daily   Hepatitis 2012   History of bronchitis    20-25 yrs ago   History of colon polyps    precancerous   History of shingles    Hyperlipidemia    takes Simvastatin daily   IBS (irritable bowel syndrome)    more on side of constipation-takes Miralax daily   Insomnia    takes Melatonin and Ambien nightly   Internal hemorrhoids    Joint pain    Joint swelling    Lower leg edema 09/21/2021   Lung nodules    calcified    Migraine headache    migraines-thinks coming from neck.Last one1/28/18   Orthostatic hypotension 09/21/2021   Pneumonia    hx of-20 yrs ago-=-walking   Renal cyst    left   Uterine cancer ([place]) 1973   Past Surgical History:  Procedure Laterality Date   APPENDECTOMY  1966   cataracts     bilateral   COLONOSCOPY  09/04/2020   EUS  02/10/2011   Procedure: UPPER ENDOSCOPIC ULTRASOUND (EUS) RADIAL;  Surgeon:  Owens Loffler, MD;  Location: [facility];  Service: Endoscopy;  Laterality: N/A;  radial linear    LUMBAR FUSION  03/2016   L4-5, Dr. Ola Spurr.   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   Dementia ([place]) 02/15/2021   Pharyngoesophageal dysphagia 12/14/2020   Osteopenia 10/23/2020   Abnormal glucose 08/13/2020   Chronic migraine without aura, with intractable migraine, so stated, with status migrainosus 07/03/2019   Carotid stenosis, bilateral 06/06/2019   Migraine headache 03/22/2019   Daily headache 03/22/2019   Myofascial pain 03/27/2018   Numbness of left foot 11/07/2017   Right foot pain 09/12/2017  Deviated septum 01/25/2017   ETD (Eustachian tube dysfunction), bilateral 01/25/2017   Seasonal allergic rhinitis 01/25/2017   Bilateral hearing loss 01/25/2017   S/P lumbar fusion 06/20/2016   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   Fatty liver 12/22/2015   Onychodystrophy 11/24/2015   Hyperlipidemia 12/17/2013   Osteoarthritis of left knee 07/05/2013   Obesity 03/10/2011   Dyspepsia 02/10/2011   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   PALPITATIONS 02/11/2010   OSTEOPENIA 11/27/2009   CONSTIPATION, CHRONIC 08/15/2008   Insomnia 07/08/2008   Chronic neck pain 04/11/2008   Disorder of kidney and ureter 01/21/2008   HEMORRHOIDS, INTERNAL 01/16/2008   Diverticulosis of colon 01/16/2008   POSTMENOPAUSAL STATUS 12/05/2007   MICROSCOPIC HEMATURIA 06/05/2007   STENOSIS, LUMBAR SPINE 12/22/2005   Anxiety and depression 11/15/2005   IRRITABLE BOWEL SYNDROME 11/15/2005    ARTHRITIS 11/15/2005    ONSET DATE: 12/01/2021  REFERRING DIAG: H81.10 (ICD-10-CM) - Benign paroxysmal positional vertigo, unspecified laterality   THERAPY DIAG:  Dizziness and giddiness  Balance problem  Chronic bilateral low back pain without sciatica  Pain in thoracic spine  Muscle weakness (generalized)  History of falling  Cervicalgia  Other headache syndrome  Rationale for Evaluation and Treatment Rehabilitation  SUBJECTIVE:   SUBJECTIVE STATEMENT: Pt reports she is feeling better and was able to drive herself to PT this morning.  Pt reports dizziness is at least 80% better since initial evaluation.  Pt accompanied by: self  PERTINENT HISTORY: Dementia, Chronic Neck Pain, BPPV   PAIN:  Are you having pain? Yes: NPRS scale: 4/10 Pain location: along cervical to lumbar spine Pain description: soreness Aggravating factors: pain worsened from fall Relieving factors: rest  PRECAUTIONS: Fall  WEIGHT BEARING RESTRICTIONS No  FALLS: Has patient fallen in last 6 months? Yes. Number of falls 1 fall when in shower  LIVING ENVIRONMENT: Lives with: lives alone with small dog Lives in: House/apartment Stairs: Yes: External: 1 steps; no rails at ramp currently, but getting them installed soon Has following equipment at home: Single point cane, Walker - 4 wheeled, bed side commode, and Grab bars  PLOF: Independent  PATIENT GOALS:  To not be as dizzy and feel better.  OBJECTIVE:   DIAGNOSTIC FINDINGS:  Lumbar Radiograph on 12/06/2021: IMPRESSION: 1. Stable spondylosis and facet hypertrophy at the L3-4 and L5-S1 levels, abutting the chronic interbody fusion at L4-5. 2. No acute bony abnormality. 3. Minimal left convex lumbar scoliosis.  Right Hip Radiograph on 12/06/2021: IMPRESSION: 1. Symmetrical bilateral hip osteoarthritis.  No acute fracture.  COGNITION: Overall cognitive status:  Pt reports that she was doing well cognitively until the Pandemic hit  and she started staying in too long, then she started having some word finding problems.  Pt now with diagnosis of dementia.   SENSATION: WFL  POSTURE: rounded shoulders and forward head   Cervical ROM:    Active A/PROM (deg) eval  Flexion 50  Extension 45  Right lateral flexion 30  Left lateral flexion 30  Right rotation 50  Left rotation 45  (Blank rows = not tested)  STRENGTH:  12/07/2021: UE strength grossly 4 to 4+/5 throughout LE strength grossly 4/5 throughout   GAIT: Gait pattern: decreased step length- Right, decreased step length- Left, and decreased trunk rotation Distance walked: 100 ft Assistive device utilized: None Level of assistance: Complete Independence Comments:   FUNCTIONAL TESTs:  12/07/2021: 5 times sit to stand: 16 sec with UE use pushing up from chair Timed up and go (TUG): 19.5 sec  with SPC  PATIENT SURVEYS:  12/07/2021: [place] Total Score: 58 / 100 Physical Score: 14 / 28 Emotional Score: 14 / 36 Functional Score: 30 / 36  VESTIBULAR ASSESSMENT   GENERAL OBSERVATION: Pt is a pleasant female with noted decreased cervical motion noted during initial evaluation    SYMPTOM BEHAVIOR:   Subjective history: Pt reports that her dizziness is not as bad as it was in June 2023 when she came in for treatment   Non-Vestibular symptoms: neck pain, nausea/vomiting, and migraine symptoms   Type of dizziness: Spinning/Vertigo   Frequency: Pt reports that the dizziness has improved some since it started last week.  No longer happening daily   Duration: depends on the time   Aggravating factors: Induced by position change: rolling to the left and Induced by motion: occur when walking, sitting in a moving car, and activity in general   Relieving factors: head stationary, slow movements, and avoid busy/distracting environments   Progression of symptoms: better   OCULOMOTOR EXAM:   Ocular Alignment: normal   Ocular ROM: No Limitations   Spontaneous  Nystagmus: absent   Gaze-Induced Nystagmus: absent   Smooth Pursuits: intact   Saccades: intact     VESTIBULAR - OCULAR REFLEX:    Positive head thrust test to bilateral sides for nystagmus    POSITIONAL TESTING:  12/07/2021: Right Dix-Hallpike: no nystagmus Left Dix-Hallpike: nystagmus noted and Duration: 20 sec    VESTIBULAR TREATMENT: 12/24/2021: Marye Round positive on the right side.  Strong nystagmus noted for 15 seconds.  Proceeded with treatment Epley Maneuver x2.  Negligible symptoms/nystagmus noted on second maneuver Nustep level 3 (seat at 8, blue machine) level 3 x5 min with PT present to discuss status Rocker board x2 min Seated hamstring stretch 2x20 sec Standing on purple foam performing rows with green tband 3x10 Standing on purple foam performing head rotations x1 min   12/15/2021: Nestor Lewandowsky x1 each direction, pt educated to stay in the position until the dizziness completely subsides  Dix Hallpike negative on the left.  Dix Hallpike positive on the right.  Proceeded with treatment with Epley Maneuver x2 with only minimal symptoms noted on the second maneuver. Standing on compliant surface with eyes closed 3x20 sec Ambulation around PT gym without SPC without a loss of balance.   12/09/2021: Dix Hallpike slight positive on left, proceeded to treatment with Epley Maneuver x1. Marye Round with strong positive nystagmus on right side, proceeded to treat with Epley Maneuver x2 Trigger Point Dry-Needling  Treatment instructions: Expect mild to moderate muscle soreness. S/S of pneumothorax if dry needled over a lung field, and to seek immediate medical attention should they occur. Patient verbalized understanding of these instructions and education. Patient Consent Given: Yes Education handout provided: Yes Muscles treated: bilateral temporalis Electrical stimulation performed: No Parameters: N/A Treatment response/outcome: Utilized skilled palpation to locate  trigger points, able to palpate muscle elongation following Ambulation around PT gym without SPC without a loss of balance.    PATIENT EDUCATION: Education details: Provided with HEP printout Person educated: Patient Education method: Explanation and Handouts Education comprehension: verbalized understanding  HOME EXERCISE PROGRAM: Access Code: [email or web address] URL: [email or web address] Date: 12/07/2021 Prepared by: Juel Burrow  Exercises - Brandt-Daroff Vestibular Exercise  - 1 x daily - 7 x weekly - 1 sets - 5 reps - Seated Gaze Stabilization with Head Nod  - 1 x daily - 7 x weekly - 3 sets - 50 reps - Seated Gaze Stabilization with Head Rotation  -  1 x daily - 7 x weekly - 3 sets - 50 reps - Pencil Pushups  - 1 x daily - 7 x weekly - 2 sets - 10 reps - Standing Gastroc Stretch on Step with Counter Support  - 1 x daily - 7 x weekly - 1 sets - 2 reps - 20 sec hold - Walking Tandem Stance  - 1 x daily - 7 x weekly - 3 sets - 10 reps - Walking with Head Nod  - 1 x daily - 7 x weekly - 3 sets - 10 reps - Walking with Head Rotation  - 1 x daily - 7 x weekly - 3 sets - 10 reps   GOALS: Goals reviewed with patient? Yes  SHORT TERM GOALS: Target date: 12/28/2021   Pt will be independent with initial HEP. Baseline: Goal status: MET  2.  Pt will report at least a 40% improvement in dizziness symptoms. Baseline: Reports 20% improvement on 12/15/2021 Goal status: MET on 12/24/2021   LONG TERM GOALS: Target date: 01/28/2022    Pt will be independent with advanced HEP. Baseline:  Goal status: INITIAL  2.  Pt will improve Dizziness Handicapped Inventory sore to less than 30/100 to indicate decreased dizziness with tasks. Baseline: 54/100 Goal status: INITIAL  3.  Pt will demonstrate negative Marye Round consistently by last PT treatment session. Baseline:  Goal status: INITIAL  4.  Pt will report at least an 80% improvement in dizziness during daily tasks and  rolling over in bed. Baseline:  Goal status: INITIAL  5.  Pt will increase bilat UE/LE strength to at least 4+/5 grossly throughout to improve ability to perform transfers in home at in community. Baseline: Goal status:  INITIAL  6.  Pt will report pain no greater than 2/10 during grocery shopping and other community outings. Baseline: Goal status:  INITIAL   ASSESSMENT:  CLINICAL IMPRESSION: Ms Hasty presents to skilled PT reporting at least 80% improvement and her ability to return to driving and walking around her home without her cane.  Pt continues with positive Dix Hallpike on right side with good response to canalith repositioning.  Pt able to progress with strengthening and core stability.  Pt with some reported muscle soreness with new strengthening exercises.  Pt continues to progress with balance and strengthening and is progressing with ability to start ambulating without assistive device and able to return to driving safely.    OBJECTIVE IMPAIRMENTS Abnormal gait, decreased balance, and dizziness.   ACTIVITY LIMITATIONS locomotion level  PARTICIPATION LIMITATIONS: community activity  PERSONAL FACTORS Age, Time since onset of injury/illness/exacerbation, and 1-2 comorbidities: Chronic Neck Pain, Dementia  are also affecting patient's functional outcome.   REHAB POTENTIAL: Good  CLINICAL DECISION MAKING: Evolving/moderate complexity  EVALUATION COMPLEXITY: Moderate   PLAN: PT FREQUENCY: 2x/week  PT DURATION: 8 weeks  PLANNED INTERVENTIONS: Therapeutic exercises, Therapeutic activity, Neuromuscular re-education, Balance training, Gait training, Patient/Family education, Joint manipulation, Joint mobilization, Stair training, Vestibular training, Canalith repositioning, Aquatic Therapy, Dry Needling, Electrical stimulation, Spinal manipulation, Spinal mobilization, Cryotherapy, Moist heat, Taping, Traction, Manual therapy, and Re-evaluation  PLAN FOR NEXT SESSION:  Marye Round and Canalith repositioning as needed, vestibular rehab, balance   Juel Burrow, PT 12/24/2021, 12:08 PM   [facility] [address], [address] [place], [place] [address] Phone # [phone] Fax [phone]

## 2021-12-25 LAB — URINALYSIS, ROUTINE W REFLEX MICROSCOPIC
Bacteria, UA: NONE SEEN /HPF
Bilirubin Urine: NEGATIVE
Glucose, UA: NEGATIVE
Hgb urine dipstick: NEGATIVE
Hyaline Cast: NONE SEEN /LPF
Ketones, ur: NEGATIVE
Nitrite: POSITIVE — AB
Protein, ur: NEGATIVE
RBC / HPF: NONE SEEN /HPF (ref 0–2)
Specific Gravity, Urine: 1.005 (ref 1.001–1.035)
pH: 6.5 (ref 5.0–8.0)

## 2021-12-25 LAB — MICROSCOPIC MESSAGE

## 2021-12-25 LAB — URINE CULTURE
MICRO NUMBER:: 14195541
SPECIMEN QUALITY:: ADEQUATE

## 2021-12-27 NOTE — Progress Notes (Signed)
HI Cheryl [name], urine culture came back positive for E. coli.  The Augmentin that I sent over before the weekend looks like it should work which is awesome!  So hopefully you are already feeling better.

## 2021-12-28 ENCOUNTER — Ambulatory Visit: Payer: PPO | Admitting: Rehabilitative and Restorative Service Providers"

## 2022-01-04 ENCOUNTER — Encounter: Payer: Self-pay | Admitting: Rehabilitative and Restorative Service Providers"

## 2022-01-04 ENCOUNTER — Ambulatory Visit: Payer: PPO | Admitting: Internal Medicine

## 2022-01-04 ENCOUNTER — Ambulatory Visit: Payer: PPO | Admitting: Rehabilitative and Restorative Service Providers"

## 2022-01-04 DIAGNOSIS — M6281 Muscle weakness (generalized): Secondary | ICD-10-CM

## 2022-01-04 DIAGNOSIS — M542 Cervicalgia: Secondary | ICD-10-CM

## 2022-01-04 DIAGNOSIS — M545 Low back pain, unspecified: Secondary | ICD-10-CM

## 2022-01-04 DIAGNOSIS — M546 Pain in thoracic spine: Secondary | ICD-10-CM

## 2022-01-04 DIAGNOSIS — G4489 Other headache syndrome: Secondary | ICD-10-CM

## 2022-01-04 DIAGNOSIS — Z9181 History of falling: Secondary | ICD-10-CM

## 2022-01-04 DIAGNOSIS — H8110 Benign paroxysmal vertigo, unspecified ear: Secondary | ICD-10-CM | POA: Diagnosis not present

## 2022-01-04 DIAGNOSIS — R2689 Other abnormalities of gait and mobility: Secondary | ICD-10-CM

## 2022-01-04 DIAGNOSIS — R42 Dizziness and giddiness: Secondary | ICD-10-CM

## 2022-01-04 NOTE — Therapy (Signed)
OUTPATIENT PHYSICAL THERAPY TREATMENT NOTE     Patient Name: Cheryl [name] MRN: [id number] DOB:1940-08-25, 81 y.o., female Today's Date: 01/04/2022  PCP: Hali Marry, MD  REFERRING PROVIDER: Hali Marry, MD    PT End of Session - 01/04/22 1150     Visit Number 5    Date for PT Re-Evaluation 01/28/22    Authorization Type [employer]    Progress Note Due on Visit 10    PT Start Time 1148    PT Stop Time 1226    PT Time Calculation (min) 38 min    Activity Tolerance Patient tolerated treatment well    Behavior During Therapy WFL for tasks assessed/performed             Past Medical History:  Diagnosis Date   Anemia    Anxiety    d/t recent family issues   Arthritis    Bone spur    Carotid stenosis, bilateral 06/06/2019   Chronic back pain    stenosis and OA   Depression    takes Paxil daily   Diverticulosis    Dizzy    occasionally   Encephalitis    at 9 months ago    Fatty liver    has had 2 Hep shots and the final one is in June 18   GERD (gastroesophageal reflux disease)    takes Omeprazole daily   Hepatitis 2012   History of bronchitis    20-25 yrs ago   History of colon polyps    precancerous   History of shingles    Hyperlipidemia    takes Simvastatin daily   IBS (irritable bowel syndrome)    more on side of constipation-takes Miralax daily   Insomnia    takes Melatonin and Ambien nightly   Internal hemorrhoids    Joint pain    Joint swelling    Lower leg edema 09/21/2021   Lung nodules    calcified    Migraine headache    migraines-thinks coming from neck.Last one1/28/18   Orthostatic hypotension 09/21/2021   Pneumonia    hx of-20 yrs ago-=-walking   Renal cyst    left   Uterine cancer ([place]) 1973   Past Surgical History:  Procedure Laterality Date   APPENDECTOMY  1966   cataracts     bilateral   COLONOSCOPY  09/04/2020   EUS  02/10/2011   Procedure: UPPER ENDOSCOPIC ULTRASOUND (EUS) RADIAL;  Surgeon:  Owens Loffler, MD;  Location: [facility];  Service: Endoscopy;  Laterality: N/A;  radial linear    LUMBAR FUSION  03/2016   L4-5, Dr. Ola Spurr.   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   Dementia ([place]) 02/15/2021   Pharyngoesophageal dysphagia 12/14/2020   Osteopenia 10/23/2020   Abnormal glucose 08/13/2020   Chronic migraine without aura, with intractable migraine, so stated, with status migrainosus 07/03/2019   Carotid stenosis, bilateral 06/06/2019   Migraine headache 03/22/2019   Daily headache 03/22/2019   Myofascial pain 03/27/2018   Numbness of left foot 11/07/2017   Right foot pain 09/12/2017  Deviated septum 01/25/2017   ETD (Eustachian tube dysfunction), bilateral 01/25/2017   Seasonal allergic rhinitis 01/25/2017   Bilateral hearing loss 01/25/2017   S/P lumbar fusion 06/20/2016   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   Fatty liver 12/22/2015   Onychodystrophy 11/24/2015   Hyperlipidemia 12/17/2013   Osteoarthritis of left knee 07/05/2013   Obesity 03/10/2011   Dyspepsia 02/10/2011   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   PALPITATIONS 02/11/2010   OSTEOPENIA 11/27/2009   CONSTIPATION, CHRONIC 08/15/2008   Insomnia 07/08/2008   Chronic neck pain 04/11/2008   Disorder of kidney and ureter 01/21/2008   HEMORRHOIDS, INTERNAL 01/16/2008   Diverticulosis of colon 01/16/2008   POSTMENOPAUSAL STATUS 12/05/2007   MICROSCOPIC HEMATURIA 06/05/2007   STENOSIS, LUMBAR SPINE 12/22/2005   Anxiety and depression 11/15/2005   IRRITABLE BOWEL SYNDROME 11/15/2005    ARTHRITIS 11/15/2005    ONSET DATE: 12/01/2021  REFERRING DIAG: H81.10 (ICD-10-CM) - Benign paroxysmal positional vertigo, unspecified laterality   THERAPY DIAG:  Dizziness and giddiness  Balance problem  Chronic bilateral low back pain without sciatica  Pain in thoracic spine  Muscle weakness (generalized)  History of falling  Cervicalgia  Other headache syndrome  Rationale for Evaluation and Treatment Rehabilitation  SUBJECTIVE:   SUBJECTIVE STATEMENT: Pt reports she is continuing to improve.  She states that she is only having mild dizziness "one or two times" when she rolls over.  Pt reports that [facility] HEP is going better.  Pt accompanied by: self  PERTINENT HISTORY: Dementia, Chronic Neck Pain, BPPV   PAIN:  Are you having pain? Yes: NPRS scale: 4/10 Pain location: along cervical to lumbar spine Pain description: soreness Aggravating factors: pain worsened from fall Relieving factors: rest  PRECAUTIONS: Fall  WEIGHT BEARING RESTRICTIONS No  FALLS: Has patient fallen in last 6 months? Yes. Number of falls 1 fall when in shower  LIVING ENVIRONMENT: Lives with: lives alone with small dog Lives in: House/apartment Stairs: Yes: External: 1 steps; no rails at ramp currently, but getting them installed soon Has following equipment at home: Single point cane, Walker - 4 wheeled, bed side commode, and Grab bars  PLOF: Independent  PATIENT GOALS:  To not be as dizzy and feel better.  OBJECTIVE:   DIAGNOSTIC FINDINGS:  Lumbar Radiograph on 12/06/2021: IMPRESSION: 1. Stable spondylosis and facet hypertrophy at the L3-4 and L5-S1 levels, abutting the chronic interbody fusion at L4-5. 2. No acute bony abnormality. 3. Minimal left convex lumbar scoliosis.  Right Hip Radiograph on 12/06/2021: IMPRESSION: 1. Symmetrical bilateral hip osteoarthritis.  No acute fracture.  COGNITION: Overall cognitive status:  Pt reports that she was doing well  cognitively until the Pandemic hit and she started staying in too long, then she started having some word finding problems.  Pt now with diagnosis of dementia.   SENSATION: WFL  POSTURE: rounded shoulders and forward head   Cervical ROM:    Active A/PROM (deg) eval  Flexion 50  Extension 45  Right lateral flexion 30  Left lateral flexion 30  Right rotation 50  Left rotation 45  (Blank rows = not tested)  STRENGTH:  12/07/2021: UE strength grossly 4 to 4+/5 throughout LE strength grossly 4/5 throughout   GAIT: Gait pattern: decreased step length- Right, decreased step length- Left, and decreased trunk rotation Distance walked: 100 ft Assistive device utilized: None Level of assistance: Complete Independence Comments:   FUNCTIONAL TESTs:  12/07/2021: 5 times sit to stand: 16 sec with UE use pushing up from chair  Timed up and go (TUG): 19.5 sec with SPC  PATIENT SURVEYS:  12/07/2021: DHI Total Score: 58 / 100 Physical Score: 14 / 28 Emotional Score: 14 / 36 Functional Score: 30 / 36  VESTIBULAR ASSESSMENT   GENERAL OBSERVATION: Pt is a pleasant female with noted decreased cervical motion noted during initial evaluation    SYMPTOM BEHAVIOR:   Subjective history: Pt reports that her dizziness is not as bad as it was in June 2023 when she came in for treatment   Non-Vestibular symptoms: neck pain, nausea/vomiting, and migraine symptoms   Type of dizziness: Spinning/Vertigo   Frequency: Pt reports that the dizziness has improved some since it started last week.  No longer happening daily   Duration: depends on the time   Aggravating factors: Induced by position change: rolling to the left and Induced by motion: occur when walking, sitting in a moving car, and activity in general   Relieving factors: head stationary, slow movements, and avoid busy/distracting environments   Progression of symptoms: better   OCULOMOTOR EXAM:   Ocular Alignment: normal   Ocular ROM:  No Limitations   Spontaneous Nystagmus: absent   Gaze-Induced Nystagmus: absent   Smooth Pursuits: intact   Saccades: intact     VESTIBULAR - OCULAR REFLEX:    Positive head thrust test to bilateral sides for nystagmus    POSITIONAL TESTING:  12/07/2021: Right Dix-Hallpike: no nystagmus Left Dix-Hallpike: nystagmus noted and Duration: 20 sec    VESTIBULAR TREATMENT:  01/04/2022: Marye Round positive on the right side.  Strong nystagmus noted for 20 seconds.  Proceeded with treatment Epley Maneuver x2.  Negligible symptoms/nystagmus noted on second maneuver Standing on purple foam performing rows with green tband 2x10 Standing on purple foam performing head rotations 2x1 min Standing on purple foam with eyes closed x30 sec with CGA Seated hamstring stretch 2x20 sec Standing chin tucks 2x10 in front of mirror for visual assistance/cuing   12/24/2021: Marye Round positive on the right side.  Strong nystagmus noted for 15 seconds.  Proceeded with treatment Epley Maneuver x2.  Negligible symptoms/nystagmus noted on second maneuver Nustep level 3 (seat at 8, blue machine) level 3 x5 min with PT present to discuss status Rocker board x2 min Seated hamstring stretch 2x20 sec Standing on purple foam performing rows with green tband 3x10 Standing on purple foam performing head rotations x1 min   12/15/2021: Nestor Lewandowsky x1 each direction, pt educated to stay in the position until the dizziness completely subsides  Dix Hallpike negative on the left.  Dix Hallpike positive on the right.  Proceeded with treatment with Epley Maneuver x2 with only minimal symptoms noted on the second maneuver. Standing on compliant surface with eyes closed 3x20 sec Ambulation around PT gym without SPC without a loss of balance.    PATIENT EDUCATION: Education details: Provided with HEP printout Person educated: Patient Education method: Explanation and Handouts Education comprehension: verbalized  understanding  HOME EXERCISE PROGRAM: Access Code: [email or web address] URL: [email or web address] Date: 12/07/2021 Prepared by: Juel Burrow  Exercises - Brandt-Daroff Vestibular Exercise  - 1 x daily - 7 x weekly - 1 sets - 5 reps - Seated Gaze Stabilization with Head Nod  - 1 x daily - 7 x weekly - 3 sets - 50 reps - Seated Gaze Stabilization with Head Rotation  - 1 x daily - 7 x weekly - 3 sets - 50 reps - Pencil Pushups  - 1 x daily - 7 x weekly - 2 sets - 10  reps - Standing Gastroc Stretch on Step with Counter Support  - 1 x daily - 7 x weekly - 1 sets - 2 reps - 20 sec hold - Walking Tandem Stance  - 1 x daily - 7 x weekly - 3 sets - 10 reps - Walking with Head Nod  - 1 x daily - 7 x weekly - 3 sets - 10 reps - Walking with Head Rotation  - 1 x daily - 7 x weekly - 3 sets - 10 reps   GOALS: Goals reviewed with patient? Yes  SHORT TERM GOALS: Target date: 12/28/2021   Pt will be independent with initial HEP. Baseline: Goal status: MET  2.  Pt will report at least a 40% improvement in dizziness symptoms. Baseline: Reports 20% improvement on 12/15/2021 Goal status: MET on 12/24/2021   LONG TERM GOALS: Target date: 01/28/2022    Pt will be independent with advanced HEP. Baseline:  Goal status: IN PROGRESS  2.  Pt will improve Dizziness Handicapped Inventory sore to less than 30/100 to indicate decreased dizziness with tasks. Baseline: 54/100 Goal status: INITIAL  3.  Pt will demonstrate negative Marye Round consistently by last PT treatment session. Baseline:  Goal status: IN PROGRESS  4.  Pt will report at least an 80% improvement in dizziness during daily tasks and rolling over in bed. Baseline:  Goal status: IN PROGRESS  5.  Pt will increase bilat UE/LE strength to at least 4+/5 grossly throughout to improve ability to perform transfers in home at in community. Baseline: Goal status:  INITIAL  6.  Pt will report pain no greater than 2/10 during grocery  shopping and other community outings. Baseline: Goal status:  INITIAL   ASSESSMENT:  CLINICAL IMPRESSION: Ms Jamroz presents to skilled PT with continued reports of decreased dizziness.  Patient states that she has been working on her exercises and improving technique.  Pt educated to hold on Nestor Lewandowsky for HEP so we can assess progress towards dizziness.  Pt continues to require skilled PT to progress towards improved functional mobility and balance.   OBJECTIVE IMPAIRMENTS Abnormal gait, decreased balance, and dizziness.   ACTIVITY LIMITATIONS locomotion level  PARTICIPATION LIMITATIONS: community activity  PERSONAL FACTORS Age, Time since onset of injury/illness/exacerbation, and 1-2 comorbidities: Chronic Neck Pain, Dementia  are also affecting patient's functional outcome.   REHAB POTENTIAL: Good  CLINICAL DECISION MAKING: Evolving/moderate complexity  EVALUATION COMPLEXITY: Moderate   PLAN: PT FREQUENCY: 2x/week  PT DURATION: 8 weeks  PLANNED INTERVENTIONS: Therapeutic exercises, Therapeutic activity, Neuromuscular re-education, Balance training, Gait training, Patient/Family education, Joint manipulation, Joint mobilization, Stair training, Vestibular training, Canalith repositioning, Aquatic Therapy, Dry Needling, Electrical stimulation, Spinal manipulation, Spinal mobilization, Cryotherapy, Moist heat, Taping, Traction, Manual therapy, and Re-evaluation  PLAN FOR NEXT SESSION: Marye Round and Canalith repositioning as needed, vestibular rehab, balance   Juel Burrow, PT 01/04/2022, 1:23 PM   [facility] [address] [place], [place] [address] Phone # [phone] Fax [phone]

## 2022-01-07 ENCOUNTER — Encounter: Payer: Self-pay | Admitting: Rehabilitative and Restorative Service Providers"

## 2022-01-07 ENCOUNTER — Ambulatory Visit: Payer: PPO | Attending: Family Medicine | Admitting: Rehabilitative and Restorative Service Providers"

## 2022-01-07 DIAGNOSIS — R2689 Other abnormalities of gait and mobility: Secondary | ICD-10-CM | POA: Diagnosis not present

## 2022-01-07 DIAGNOSIS — G4489 Other headache syndrome: Secondary | ICD-10-CM | POA: Diagnosis not present

## 2022-01-07 DIAGNOSIS — M542 Cervicalgia: Secondary | ICD-10-CM | POA: Diagnosis not present

## 2022-01-07 DIAGNOSIS — M6281 Muscle weakness (generalized): Secondary | ICD-10-CM | POA: Insufficient documentation

## 2022-01-07 DIAGNOSIS — M546 Pain in thoracic spine: Secondary | ICD-10-CM | POA: Insufficient documentation

## 2022-01-07 DIAGNOSIS — M545 Low back pain, unspecified: Secondary | ICD-10-CM | POA: Insufficient documentation

## 2022-01-07 DIAGNOSIS — G8929 Other chronic pain: Secondary | ICD-10-CM | POA: Diagnosis not present

## 2022-01-07 DIAGNOSIS — Z9181 History of falling: Secondary | ICD-10-CM | POA: Diagnosis not present

## 2022-01-07 DIAGNOSIS — R42 Dizziness and giddiness: Secondary | ICD-10-CM | POA: Insufficient documentation

## 2022-01-07 NOTE — Therapy (Signed)
OUTPATIENT PHYSICAL THERAPY TREATMENT NOTE     Patient Name: Cheryl [name] MRN: [id number] DOB:10/04/1940, 81 y.o., female Today's Date: 01/07/2022  PCP: Hali Marry, MD  REFERRING PROVIDER: Hali Marry, MD    PT End of Session - 01/07/22 0934     Visit Number 6    Date for PT Re-Evaluation 01/28/22    Authorization Type [employer]    Progress Note Due on Visit 10    PT Start Time 0930    PT Stop Time 1010    PT Time Calculation (min) 40 min    Activity Tolerance Patient tolerated treatment well    Behavior During Therapy [facility] for tasks assessed/performed             Past Medical History:  Diagnosis Date   Anemia    Anxiety    d/t recent family issues   Arthritis    Bone spur    Carotid stenosis, bilateral 06/06/2019   Chronic back pain    stenosis and OA   Depression    takes Paxil daily   Diverticulosis    Dizzy    occasionally   Encephalitis    at 9 months ago    Fatty liver    has had 2 Hep shots and the final one is in June 18   GERD (gastroesophageal reflux disease)    takes Omeprazole daily   Hepatitis 2012   History of bronchitis    20-25 yrs ago   History of colon polyps    precancerous   History of shingles    Hyperlipidemia    takes Simvastatin daily   IBS (irritable bowel syndrome)    more on side of constipation-takes Miralax daily   Insomnia    takes Melatonin and Ambien nightly   Internal hemorrhoids    Joint pain    Joint swelling    Lower leg edema 09/21/2021   Lung nodules    calcified    Migraine headache    migraines-thinks coming from neck.Last one1/28/18   Orthostatic hypotension 09/21/2021   Pneumonia    hx of-20 yrs ago-=-walking   Renal cyst    left   Uterine cancer ([place]) 1973   Past Surgical History:  Procedure Laterality Date   APPENDECTOMY  1966   cataracts     bilateral   COLONOSCOPY  09/04/2020   EUS  02/10/2011   Procedure: UPPER ENDOSCOPIC ULTRASOUND (EUS) RADIAL;  Surgeon:  Owens Loffler, MD;  Location: [facility];  Service: Endoscopy;  Laterality: N/A;  radial linear    LUMBAR FUSION  03/2016   L4-5, Dr. Ola Spurr.   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   Dementia ([place]) 02/15/2021   Pharyngoesophageal dysphagia 12/14/2020   Osteopenia 10/23/2020   Abnormal glucose 08/13/2020   Chronic migraine without aura, with intractable migraine, so stated, with status migrainosus 07/03/2019   Carotid stenosis, bilateral 06/06/2019   Migraine headache 03/22/2019   Daily headache 03/22/2019   Myofascial pain 03/27/2018   Numbness of left foot 11/07/2017   Right foot pain 09/12/2017  Deviated septum 01/25/2017   ETD (Eustachian tube dysfunction), bilateral 01/25/2017   Seasonal allergic rhinitis 01/25/2017   Bilateral hearing loss 01/25/2017   S/P lumbar fusion 06/20/2016   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   Fatty liver 12/22/2015   Onychodystrophy 11/24/2015   Hyperlipidemia 12/17/2013   Osteoarthritis of left knee 07/05/2013   Obesity 03/10/2011   Dyspepsia 02/10/2011   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   PALPITATIONS 02/11/2010   OSTEOPENIA 11/27/2009   CONSTIPATION, CHRONIC 08/15/2008   Insomnia 07/08/2008   Chronic neck pain 04/11/2008   Disorder of kidney and ureter 01/21/2008   HEMORRHOIDS, INTERNAL 01/16/2008   Diverticulosis of colon 01/16/2008   POSTMENOPAUSAL STATUS 12/05/2007   MICROSCOPIC HEMATURIA 06/05/2007   STENOSIS, LUMBAR SPINE 12/22/2005   Anxiety and depression 11/15/2005   IRRITABLE BOWEL SYNDROME 11/15/2005    ARTHRITIS 11/15/2005    ONSET DATE: 12/01/2021  REFERRING DIAG: H81.10 (ICD-10-CM) - Benign paroxysmal positional vertigo, unspecified laterality   THERAPY DIAG:  Dizziness and giddiness  Balance problem  Chronic bilateral low back pain without sciatica  Pain in thoracic spine  Muscle weakness (generalized)  History of falling  Cervicalgia  Other headache syndrome  Rationale for Evaluation and Treatment Rehabilitation  SUBJECTIVE:   SUBJECTIVE STATEMENT: Pt denies any recent dizziness with turning over in bed.  Pt does state that she has been having increased sinus pressure.  Pt denies taking any medication for allergies.  States that she is feeling sinus pressure behind her eyes and the starts of a migraine.  Pt accompanied by: self  PERTINENT HISTORY: Dementia, Chronic Neck Pain, BPPV   PAIN:  Are you having pain? Yes: NPRS scale: 3/10 Pain location: along cervical to lumbar spine Pain description: soreness Aggravating factors: pain worsened from fall Relieving factors: rest  PRECAUTIONS: Fall  WEIGHT BEARING RESTRICTIONS No  FALLS: Has patient fallen in last 6 months? Yes. Number of falls 1 fall when in shower  LIVING ENVIRONMENT: Lives with: lives alone with small dog Lives in: House/apartment Stairs: Yes: External: 1 steps; no rails at ramp currently, but getting them installed soon Has following equipment at home: Single point cane, Walker - 4 wheeled, bed side commode, and Grab bars  PLOF: Independent  PATIENT GOALS:  To not be as dizzy and feel better.  OBJECTIVE:   DIAGNOSTIC FINDINGS:  Lumbar Radiograph on 12/06/2021: IMPRESSION: 1. Stable spondylosis and facet hypertrophy at the L3-4 and L5-S1 levels, abutting the chronic interbody fusion at L4-5. 2. No acute bony abnormality. 3. Minimal left convex lumbar scoliosis.  Right Hip Radiograph on 12/06/2021: IMPRESSION: 1. Symmetrical bilateral hip osteoarthritis.  No acute  fracture.  COGNITION: Overall cognitive status:  Pt reports that she was doing well cognitively until the Pandemic hit and she started staying in too long, then she started having some word finding problems.  Pt now with diagnosis of dementia.   SENSATION: WFL  POSTURE: rounded shoulders and forward head   Cervical ROM:    Active A/PROM (deg) eval  Flexion 50  Extension 45  Right lateral flexion 30  Left lateral flexion 30  Right rotation 50  Left rotation 45  (Blank rows = not tested)  STRENGTH:  12/07/2021: UE strength grossly 4 to 4+/5 throughout LE strength grossly 4/5 throughout   GAIT: Gait pattern: decreased step length- Right, decreased step length- Left, and decreased trunk rotation Distance walked: 100 ft Assistive device utilized: None Level of assistance: Complete Independence Comments:   FUNCTIONAL TESTs:  12/07/2021: 5 times  sit to stand: 16 sec with UE use pushing up from chair Timed up and go (TUG): 19.5 sec with [facility]  01/07/2022: 5 times sit to stand: 15.5 sec with UE pushing up from thighs Timed up and go (TUG): 11.5 sec   PATIENT SURVEYS:  12/07/2021: DHI Total Score: 58 / 100 Physical Score: 14 / 28 Emotional Score: 14 / 36 Functional Score: 30 / 36  VESTIBULAR ASSESSMENT   GENERAL OBSERVATION: Pt is a pleasant female with noted decreased cervical motion noted during initial evaluation    SYMPTOM BEHAVIOR:   Subjective history: Pt reports that her dizziness is not as bad as it was in June 2023 when she came in for treatment   Non-Vestibular symptoms: neck pain, nausea/vomiting, and migraine symptoms   Type of dizziness: Spinning/Vertigo   Frequency: Pt reports that the dizziness has improved some since it started last week.  No longer happening daily   Duration: depends on the time   Aggravating factors: Induced by position change: rolling to the left and Induced by motion: occur when walking, sitting in a moving car, and activity in  general   Relieving factors: head stationary, slow movements, and avoid busy/distracting environments   Progression of symptoms: better   OCULOMOTOR EXAM:   Ocular Alignment: normal   Ocular ROM: No Limitations   Spontaneous Nystagmus: absent   Gaze-Induced Nystagmus: absent   Smooth Pursuits: intact   Saccades: intact     VESTIBULAR - OCULAR REFLEX:    Positive head thrust test to bilateral sides for nystagmus    POSITIONAL TESTING:  12/07/2021: Right Dix-Hallpike: no nystagmus Left Dix-Hallpike: nystagmus noted and Duration: 20 sec    VESTIBULAR TREATMENT:  01/07/2022: Miguel Dibble Hallpike negative bilaterally.  Patient wanted to proceed to treatment of Epley maneuver on right side to ensure that she is doing okay. Tandem Gait with occasional UE support on barre 4 x 8 ft Single leg stance on flat side of half bolster x20 sec bilat "Twister" type weight shifting with 3 color dots on floor Nustep level 3 x5 min with PT present to discuss status and HEP   01/04/2022: Marye Round positive on the right side.  Strong nystagmus noted for 20 seconds.  Proceeded with treatment Epley Maneuver x2.  Negligible symptoms/nystagmus noted on second maneuver Standing on purple foam performing rows with green tband 2x10 Standing on purple foam performing head rotations 2x1 min Standing on purple foam with eyes closed x30 sec with CGA Seated hamstring stretch 2x20 sec Standing chin tucks 2x10 in front of mirror for visual assistance/cuing   12/24/2021: Marye Round positive on the right side.  Strong nystagmus noted for 15 seconds.  Proceeded with treatment Epley Maneuver x2.  Negligible symptoms/nystagmus noted on second maneuver Nustep level 3 (seat at 8, blue machine) level 3 x5 min with PT present to discuss status Rocker board x2 min Seated hamstring stretch 2x20 sec Standing on purple foam performing rows with green tband 3x10 Standing on purple foam performing head rotations x1  min    PATIENT EDUCATION: Education details: Provided with HEP printout Person educated: Patient Education method: Explanation and Handouts Education comprehension: verbalized understanding  HOME EXERCISE PROGRAM: Access Code: [email or web address] URL: [email or web address] Date: 12/07/2021 Prepared by: Juel Burrow  Exercises - Brandt-Daroff Vestibular Exercise  - 1 x daily - 7 x weekly - 1 sets - 5 reps - Seated Gaze Stabilization with Head Nod  - 1 x daily - 7 x weekly - 3 sets - 50 reps -  Seated Gaze Stabilization with Head Rotation  - 1 x daily - 7 x weekly - 3 sets - 50 reps - Pencil Pushups  - 1 x daily - 7 x weekly - 2 sets - 10 reps - Standing Gastroc Stretch on Step with Counter Support  - 1 x daily - 7 x weekly - 1 sets - 2 reps - 20 sec hold - Walking Tandem Stance  - 1 x daily - 7 x weekly - 3 sets - 10 reps - Walking with Head Nod  - 1 x daily - 7 x weekly - 3 sets - 10 reps - Walking with Head Rotation  - 1 x daily - 7 x weekly - 3 sets - 10 reps   GOALS: Goals reviewed with patient? Yes  SHORT TERM GOALS: Target date: 12/28/2021   Pt will be independent with initial HEP. Baseline: Goal status: MET  2.  Pt will report at least a 40% improvement in dizziness symptoms. Baseline: Reports 20% improvement on 12/15/2021 Goal status: MET on 12/24/2021   LONG TERM GOALS: Target date: 01/28/2022    Pt will be independent with advanced HEP. Baseline:  Goal status: IN PROGRESS  2.  Pt will improve Dizziness Handicapped Inventory sore to less than 30/100 to indicate decreased dizziness with tasks. Baseline: 54/100 Goal status: INITIAL  3.  Pt will demonstrate negative Marye Round consistently by last PT treatment session. Baseline:  Goal status: IN PROGRESS  4.  Pt will report at least an 80% improvement in dizziness during daily tasks and rolling over in bed. Baseline:  Goal status: IN PROGRESS  5.  Pt will increase bilat UE/LE strength to at least  4+/5 grossly throughout to improve ability to perform transfers in home at in community. Baseline: Goal status:  INITIAL  6.  Pt will report pain no greater than 2/10 during grocery shopping and other community outings. Baseline: Goal status:  INITIAL   ASSESSMENT:  CLINICAL IMPRESSION: Cheryl [name] presents to skilled PT with continued reports of decreased dizziness and denies dizziness with rolling over in bed.  Pt states that she was able to go for a walk.  Recommended that pt follow up with PCP or ENT secondary to increased sinus pressure pain, she verbalizes her understanding. Pt with improved time noted during today's session with 5 times sit to stand and TUG.  Patient able to progress with dynamic balance activities during session, limited with some tasks secondary to pain in LE.  Patient continues to progress with goal related activities.   OBJECTIVE IMPAIRMENTS Abnormal gait, decreased balance, and dizziness.   ACTIVITY LIMITATIONS locomotion level  PARTICIPATION LIMITATIONS: community activity  PERSONAL FACTORS Age, Time since onset of injury/illness/exacerbation, and 1-2 comorbidities: Chronic Neck Pain, Dementia  are also affecting patient's functional outcome.   REHAB POTENTIAL: Good  CLINICAL DECISION MAKING: Evolving/moderate complexity  EVALUATION COMPLEXITY: Moderate   PLAN: PT FREQUENCY: 2x/week  PT DURATION: 8 weeks  PLANNED INTERVENTIONS: Therapeutic exercises, Therapeutic activity, Neuromuscular re-education, Balance training, Gait training, Patient/Family education, Joint manipulation, Joint mobilization, Stair training, Vestibular training, Canalith repositioning, Aquatic Therapy, Dry Needling, Electrical stimulation, Spinal manipulation, Spinal mobilization, Cryotherapy, Moist heat, Taping, Traction, Manual therapy, and Re-evaluation  PLAN FOR NEXT SESSION: Marye Round and Canalith repositioning as needed, vestibular rehab, balance   Juel Burrow,  PT 01/07/2022, 10:15 AM   [facility] [address], [address] [place], [place] [address] Phone # [phone] Fax [phone]

## 2022-01-11 ENCOUNTER — Ambulatory Visit: Payer: PPO | Admitting: Rehabilitative and Restorative Service Providers"

## 2022-01-11 ENCOUNTER — Encounter: Payer: Self-pay | Admitting: Rehabilitative and Restorative Service Providers"

## 2022-01-11 DIAGNOSIS — R2689 Other abnormalities of gait and mobility: Secondary | ICD-10-CM

## 2022-01-11 DIAGNOSIS — M546 Pain in thoracic spine: Secondary | ICD-10-CM

## 2022-01-11 DIAGNOSIS — G4489 Other headache syndrome: Secondary | ICD-10-CM

## 2022-01-11 DIAGNOSIS — M6281 Muscle weakness (generalized): Secondary | ICD-10-CM

## 2022-01-11 DIAGNOSIS — M545 Low back pain, unspecified: Secondary | ICD-10-CM

## 2022-01-11 DIAGNOSIS — Z9181 History of falling: Secondary | ICD-10-CM

## 2022-01-11 DIAGNOSIS — M542 Cervicalgia: Secondary | ICD-10-CM

## 2022-01-11 DIAGNOSIS — R42 Dizziness and giddiness: Secondary | ICD-10-CM | POA: Diagnosis not present

## 2022-01-11 NOTE — Therapy (Signed)
OUTPATIENT PHYSICAL THERAPY TREATMENT NOTE     Patient Name: Cheryl [name] MRN: [id number] DOB:[birth year], 81 y.o., female Today's Date: 01/11/2022  PCP: Hali Marry, MD  REFERRING PROVIDER: Hali Marry, MD    PT End of Session - 01/11/22 1147     Visit Number 7    Date for PT Re-Evaluation 01/28/22    Authorization Type [employer]    Progress Note Due on Visit 10    PT Start Time 1146    PT Stop Time 1225    PT Time Calculation (min) 39 min    Activity Tolerance Patient tolerated treatment well    Behavior During Therapy WFL for tasks assessed/performed             Past Medical History:  Diagnosis Date   Anemia    Anxiety    d/t recent family issues   Arthritis    Bone spur    Carotid stenosis, bilateral 06/06/2019   Chronic back pain    stenosis and OA   Depression    takes Paxil daily   Diverticulosis    Dizzy    occasionally   Encephalitis    at 9 months ago    Fatty liver    has had 2 Hep shots and the final one is in June 18   GERD (gastroesophageal reflux disease)    takes Omeprazole daily   Hepatitis 2012   History of bronchitis    20-25 yrs ago   History of colon polyps    precancerous   History of shingles    Hyperlipidemia    takes Simvastatin daily   IBS (irritable bowel syndrome)    more on side of constipation-takes Miralax daily   Insomnia    takes Melatonin and Ambien nightly   Internal hemorrhoids    Joint pain    Joint swelling    Lower leg edema 09/21/2021   Lung nodules    calcified    Migraine headache    migraines-thinks coming from neck.Last one1/28/18   Orthostatic hypotension 09/21/2021   Pneumonia    hx of-20 yrs ago-=-walking   Renal cyst    left   Uterine cancer ([place]) 1973   Past Surgical History:  Procedure Laterality Date   APPENDECTOMY  1966   cataracts     bilateral   COLONOSCOPY  09/04/2020   EUS  02/10/2011   Procedure: UPPER ENDOSCOPIC ULTRASOUND (EUS) RADIAL;  Surgeon:  Owens Loffler, MD;  Location: [facility];  Service: Endoscopy;  Laterality: N/A;  radial linear    LUMBAR FUSION  03/2016   L4-5, Dr. Ola Spurr.   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   Dementia ([place]) 02/15/2021   Pharyngoesophageal dysphagia 12/14/2020   Osteopenia 10/23/2020   Abnormal glucose 08/13/2020   Chronic migraine without aura, with intractable migraine, so stated, with status migrainosus 07/03/2019   Carotid stenosis, bilateral 06/06/2019   Migraine headache 03/22/2019   Daily headache 03/22/2019   Myofascial pain 03/27/2018   Numbness of left foot 11/07/2017   Right foot pain 09/12/2017  Deviated septum 01/25/2017   ETD (Eustachian tube dysfunction), bilateral 01/25/2017   Seasonal allergic rhinitis 01/25/2017   Bilateral hearing loss 01/25/2017   S/P lumbar fusion 06/20/2016   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   Fatty liver 12/22/2015   Onychodystrophy 11/24/2015   Hyperlipidemia 12/17/2013   Osteoarthritis of left knee 07/05/2013   Obesity 03/10/2011   Dyspepsia 02/10/2011   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   PALPITATIONS 02/11/2010   OSTEOPENIA 11/27/2009   CONSTIPATION, CHRONIC 08/15/2008   Insomnia 07/08/2008   Chronic neck pain 04/11/2008   Disorder of kidney and ureter 01/21/2008   HEMORRHOIDS, INTERNAL 01/16/2008   Diverticulosis of colon 01/16/2008   POSTMENOPAUSAL STATUS 12/05/2007   MICROSCOPIC HEMATURIA 06/05/2007   STENOSIS, LUMBAR SPINE 12/22/2005   Anxiety and depression 11/15/2005   IRRITABLE BOWEL SYNDROME 11/15/2005    ARTHRITIS 11/15/2005    ONSET DATE: 12/01/2021  REFERRING DIAG: H81.10 (ICD-10-CM) - Benign paroxysmal positional vertigo, unspecified laterality   THERAPY DIAG:  Dizziness and giddiness  Balance problem  Chronic bilateral low back pain without sciatica  Pain in thoracic spine  Muscle weakness (generalized)  History of falling  Cervicalgia  Other headache syndrome  Rationale for Evaluation and Treatment Rehabilitation  SUBJECTIVE:   SUBJECTIVE STATEMENT: Pt reports that she started having right ankle swelling yesterday.  Pt states that she had been doing heel raises at the counter, but denies any falls or other mechanism of injury.  States right ankle pain of 2/10  Pt accompanied by: self  PERTINENT HISTORY: Dementia, Chronic Neck Pain, BPPV   PAIN:  Are you having pain? Yes: NPRS scale: 2/10 Pain location: along cervical to lumbar spine Pain description: soreness Aggravating factors: pain worsened from fall Relieving factors: rest  PRECAUTIONS: Fall  WEIGHT BEARING RESTRICTIONS No  FALLS: Has patient fallen in last 6 months? Yes. Number of falls 1 fall when in shower  LIVING ENVIRONMENT: Lives with: lives alone with small dog Lives in: House/apartment Stairs: Yes: External: 1 steps; no rails at ramp currently, but getting them installed soon Has following equipment at home: Single point cane, Walker - 4 wheeled, bed side commode, and Grab bars  PLOF: Independent  PATIENT GOALS:  To not be as dizzy and feel better.  OBJECTIVE:   DIAGNOSTIC FINDINGS:  Lumbar Radiograph on 12/06/2021: IMPRESSION: 1. Stable spondylosis and facet hypertrophy at the L3-4 and L5-S1 levels, abutting the chronic interbody fusion at L4-5. 2. No acute bony abnormality. 3. Minimal left convex lumbar scoliosis.  Right Hip Radiograph on 12/06/2021: IMPRESSION: 1. Symmetrical bilateral hip osteoarthritis.  No acute fracture.  COGNITION: Overall cognitive status:  Pt reports  that she was doing well cognitively until the Pandemic hit and she started staying in too long, then she started having some word finding problems.  Pt now with diagnosis of dementia.   SENSATION: WFL  POSTURE: rounded shoulders and forward head   Cervical ROM:    Active A/PROM (deg) eval  Flexion 50  Extension 45  Right lateral flexion 30  Left lateral flexion 30  Right rotation 50  Left rotation 45  (Blank rows = not tested)  STRENGTH:  12/07/2021: UE strength grossly 4 to 4+/5 throughout LE strength grossly 4/5 throughout    EDEMA: 01/11/2022: Right ankle:  Figure of 8- 51 cm, Circumference of malleolus- 24 cm Left ankle:  Figure of 8- 48 cm, Circumference of malleolus- 22 cm  GAIT: Gait pattern: decreased step length- Right, decreased step length- Left, and decreased trunk rotation  Distance walked: 100 ft Assistive device utilized: None Level of assistance: Complete Independence Comments:   FUNCTIONAL TESTs:  12/07/2021: 5 times sit to stand: 16 sec with UE use pushing up from chair Timed up and go (TUG): 19.5 sec with [facility]  01/07/2022: 5 times sit to stand: 15.5 sec with UE pushing up from thighs Timed up and go (TUG): 11.5 sec   PATIENT SURVEYS:  12/07/2021: DHI Total Score: 58 / 100 Physical Score: 14 / 28 Emotional Score: 14 / 36 Functional Score: 30 / 36  VESTIBULAR ASSESSMENT   GENERAL OBSERVATION: Pt is a pleasant female with noted decreased cervical motion noted during initial evaluation    SYMPTOM BEHAVIOR:   Subjective history: Pt reports that her dizziness is not as bad as it was in June 2023 when she came in for treatment   Non-Vestibular symptoms: neck pain, nausea/vomiting, and migraine symptoms   Type of dizziness: Spinning/Vertigo   Frequency: Pt reports that the dizziness has improved some since it started last week.  No longer happening daily   Duration: depends on the time   Aggravating factors: Induced by position change: rolling  to the left and Induced by motion: occur when walking, sitting in a moving car, and activity in general   Relieving factors: head stationary, slow movements, and avoid busy/distracting environments   Progression of symptoms: better   OCULOMOTOR EXAM:   Ocular Alignment: normal   Ocular ROM: No Limitations   Spontaneous Nystagmus: absent   Gaze-Induced Nystagmus: absent   Smooth Pursuits: intact   Saccades: intact     VESTIBULAR - OCULAR REFLEX:    Positive head thrust test to bilateral sides for nystagmus    POSITIONAL TESTING:  12/07/2021: Right Dix-Hallpike: no nystagmus Left Dix-Hallpike: nystagmus noted and Duration: 20 sec    VESTIBULAR TREATMENT:  01/11/2022: Measure of ankle edema Dix Hallpike positive on right side (5 sec duration of nystagmus), Proceeded with Epley maneuver x2 Pencil Pushups 2x10 Seated Gaze Stabilization with head nods and head rotations x1 min each Seated chin retraction 2x10 Standing on purple foam performing rows and shoulder extension with green tband 2x10 Standing on purple foam performing head rotations and head nods x1 min each Nustep level 3 x5 min with PT present to discuss status and HEP   01/07/2022: Dix Hallpike negative bilaterally.  Patient wanted to proceed to treatment of Epley maneuver on right side to ensure that she is doing okay. Tandem Gait with occasional UE support on barre 4 x 8 ft Single leg stance on flat side of half bolster x20 sec bilat "Twister" type weight shifting with 3 color dots on floor Nustep level 3 x5 min with PT present to discuss status and HEP   01/04/2022: Marye Round positive on the right side.  Strong nystagmus noted for 20 seconds.  Proceeded with treatment Epley Maneuver x2.  Negligible symptoms/nystagmus noted on second maneuver Standing on purple foam performing rows with green tband 2x10 Standing on purple foam performing head rotations 2x1 min Standing on purple foam with eyes closed x30 sec  with CGA Seated hamstring stretch 2x20 sec Standing chin tucks 2x10 in front of mirror for visual assistance/cuing    PATIENT EDUCATION: Education details: Provided with HEP printout Person educated: Patient Education method: Explanation and Handouts Education comprehension: verbalized understanding  HOME EXERCISE PROGRAM: Access Code: [email or web address] URL: [email or web address] Date: 12/07/2021 Prepared by: Juel Burrow  Exercises - Brandt-Daroff Vestibular Exercise  - 1 x daily - 7 x weekly - 1 sets -  5 reps - Seated Gaze Stabilization with Head Nod  - 1 x daily - 7 x weekly - 3 sets - 50 reps - Seated Gaze Stabilization with Head Rotation  - 1 x daily - 7 x weekly - 3 sets - 50 reps - Pencil Pushups  - 1 x daily - 7 x weekly - 2 sets - 10 reps - Standing Gastroc Stretch on Step with Counter Support  - 1 x daily - 7 x weekly - 1 sets - 2 reps - 20 sec hold - Walking Tandem Stance  - 1 x daily - 7 x weekly - 3 sets - 10 reps - Walking with Head Nod  - 1 x daily - 7 x weekly - 3 sets - 10 reps - Walking with Head Rotation  - 1 x daily - 7 x weekly - 3 sets - 10 reps   GOALS: Goals reviewed with patient? Yes  SHORT TERM GOALS: Target date: 12/28/2021   Pt will be independent with initial HEP. Baseline: Goal status: MET  2.  Pt will report at least a 40% improvement in dizziness symptoms. Baseline: Reports 20% improvement on 12/15/2021 Goal status: MET on 12/24/2021   LONG TERM GOALS: Target date: 01/28/2022    Pt will be independent with advanced HEP. Baseline:  Goal status: IN PROGRESS  2.  Pt will improve Dizziness Handicapped Inventory sore to less than 30/100 to indicate decreased dizziness with tasks. Baseline: 54/100 Goal status: INITIAL  3.  Pt will demonstrate negative Marye Round consistently by last PT treatment session. Baseline:  Goal status: IN PROGRESS  4.  Pt will report at least an 80% improvement in dizziness during daily tasks and  rolling over in bed. Baseline:  Goal status: IN PROGRESS  5.  Pt will increase bilat UE/LE strength to at least 4+/5 grossly throughout to improve ability to perform transfers in home at in community. Baseline: Goal status:  INITIAL  6.  Pt will report pain no greater than 2/10 during grocery shopping and other community outings. Baseline: Goal status:  INITIAL   ASSESSMENT:  CLINICAL IMPRESSION: Ms Grussing presents to skilled PT stating that she is having some increased right ankle edema, but has been reporting less dizziness.  Measured ankle edema measurements for future comparison, if needed/indicated.  Patient able to progress with primarily seated exercise session today to decrease strain on right ankle.  Pt without complaints of increased right ankle pain during session.  Noted pt only with 5 sec of nystagmus during [facility], but no nystagmus noted on second test following first Epley.  Patient continues to require skilled PT to progress towards goal related activities.   OBJECTIVE IMPAIRMENTS Abnormal gait, decreased balance, and dizziness.   ACTIVITY LIMITATIONS locomotion level  PARTICIPATION LIMITATIONS: community activity  PERSONAL FACTORS Age, Time since onset of injury/illness/exacerbation, and 1-2 comorbidities: Chronic Neck Pain, Dementia  are also affecting patient's functional outcome.   REHAB POTENTIAL: Good  CLINICAL DECISION MAKING: Evolving/moderate complexity  EVALUATION COMPLEXITY: Moderate   PLAN: PT FREQUENCY: 2x/week  PT DURATION: 8 weeks  PLANNED INTERVENTIONS: Therapeutic exercises, Therapeutic activity, Neuromuscular re-education, Balance training, Gait training, Patient/Family education, Joint manipulation, Joint mobilization, Stair training, Vestibular training, Canalith repositioning, Aquatic Therapy, Dry Needling, Electrical stimulation, Spinal manipulation, Spinal mobilization, Cryotherapy, Moist heat, Taping, Traction, Manual therapy, and  Re-evaluation  PLAN FOR NEXT SESSION: Marye Round and Canalith repositioning as needed, vestibular rehab, balance   Juel Burrow, PT 01/11/2022, 12:53 PM   [facility]  [place], [address] [address] [place], [place] [address] Phone # [phone] Fax [phone]

## 2022-01-13 ENCOUNTER — Ambulatory Visit: Payer: PPO | Admitting: Rehabilitative and Restorative Service Providers"

## 2022-01-18 ENCOUNTER — Ambulatory Visit: Payer: PPO | Admitting: Rehabilitative and Restorative Service Providers"

## 2022-01-18 ENCOUNTER — Encounter: Payer: Self-pay | Admitting: Rehabilitative and Restorative Service Providers"

## 2022-01-18 DIAGNOSIS — M546 Pain in thoracic spine: Secondary | ICD-10-CM

## 2022-01-18 DIAGNOSIS — R42 Dizziness and giddiness: Secondary | ICD-10-CM

## 2022-01-18 DIAGNOSIS — M542 Cervicalgia: Secondary | ICD-10-CM

## 2022-01-18 DIAGNOSIS — G4489 Other headache syndrome: Secondary | ICD-10-CM

## 2022-01-18 DIAGNOSIS — Z9181 History of falling: Secondary | ICD-10-CM

## 2022-01-18 DIAGNOSIS — R2689 Other abnormalities of gait and mobility: Secondary | ICD-10-CM

## 2022-01-18 DIAGNOSIS — M545 Low back pain, unspecified: Secondary | ICD-10-CM

## 2022-01-18 DIAGNOSIS — M6281 Muscle weakness (generalized): Secondary | ICD-10-CM

## 2022-01-18 NOTE — Therapy (Signed)
OUTPATIENT PHYSICAL THERAPY TREATMENT NOTE     Patient Name: Cheryl [name] MRN: [id number] DOB:May 10, 1940, 81 y.o., female Today's Date: 01/18/2022  PCP: Hali Marry, MD  REFERRING PROVIDER: Hali Marry, MD    PT End of Session - 01/18/22 1147     Visit Number 8    Date for PT Re-Evaluation 01/28/22    Authorization Type [employer]    Progress Note Due on Visit 10    PT Start Time 1145    PT Stop Time 1225    PT Time Calculation (min) 40 min    Activity Tolerance Patient tolerated treatment well    Behavior During Therapy [facility] for tasks assessed/performed             Past Medical History:  Diagnosis Date   Anemia    Anxiety    d/t recent family issues   Arthritis    Bone spur    Carotid stenosis, bilateral 06/06/2019   Chronic back pain    stenosis and OA   Depression    takes Paxil daily   Diverticulosis    Dizzy    occasionally   Encephalitis    at 9 months ago    Fatty liver    has had 2 Hep shots and the final one is in June 18   GERD (gastroesophageal reflux disease)    takes Omeprazole daily   Hepatitis 2012   History of bronchitis    20-25 yrs ago   History of colon polyps    precancerous   History of shingles    Hyperlipidemia    takes Simvastatin daily   IBS (irritable bowel syndrome)    more on side of constipation-takes Miralax daily   Insomnia    takes Melatonin and Ambien nightly   Internal hemorrhoids    Joint pain    Joint swelling    Lower leg edema 09/21/2021   Lung nodules    calcified    Migraine headache    migraines-thinks coming from neck.Last one1/28/18   Orthostatic hypotension 09/21/2021   Pneumonia    hx of-20 yrs ago-=-walking   Renal cyst    left   Uterine cancer ([place]) 1973   Past Surgical History:  Procedure Laterality Date   APPENDECTOMY  1966   cataracts     bilateral   COLONOSCOPY  09/04/2020   EUS  02/10/2011   Procedure: UPPER ENDOSCOPIC ULTRASOUND (EUS) RADIAL;  Surgeon:  Owens Loffler, MD;  Location: [facility];  Service: Endoscopy;  Laterality: N/A;  radial linear    LUMBAR FUSION  03/2016   L4-5, Dr. Ola Spurr.   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   Dementia ([place]) 02/15/2021   Pharyngoesophageal dysphagia 12/14/2020   Osteopenia 10/23/2020   Abnormal glucose 08/13/2020   Chronic migraine without aura, with intractable migraine, so stated, with status migrainosus 07/03/2019   Carotid stenosis, bilateral 06/06/2019   Migraine headache 03/22/2019   Daily headache 03/22/2019   Myofascial pain 03/27/2018   Numbness of left foot 11/07/2017   Right foot pain 09/12/2017  Deviated septum 01/25/2017   ETD (Eustachian tube dysfunction), bilateral 01/25/2017   Seasonal allergic rhinitis 01/25/2017   Bilateral hearing loss 01/25/2017   S/P lumbar fusion 06/20/2016   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   Fatty liver 12/22/2015   Onychodystrophy 11/24/2015   Hyperlipidemia 12/17/2013   Osteoarthritis of left knee 07/05/2013   Obesity 03/10/2011   Dyspepsia 02/10/2011   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   PALPITATIONS 02/11/2010   OSTEOPENIA 11/27/2009   CONSTIPATION, CHRONIC 08/15/2008   Insomnia 07/08/2008   Chronic neck pain 04/11/2008   Disorder of kidney and ureter 01/21/2008   HEMORRHOIDS, INTERNAL 01/16/2008   Diverticulosis of colon 01/16/2008   POSTMENOPAUSAL STATUS 12/05/2007   MICROSCOPIC HEMATURIA 06/05/2007   STENOSIS, LUMBAR SPINE 12/22/2005   Anxiety and depression 11/15/2005   IRRITABLE BOWEL SYNDROME 11/15/2005    ARTHRITIS 11/15/2005    ONSET DATE: 12/01/2021  REFERRING DIAG: H81.10 (ICD-10-CM) - Benign paroxysmal positional vertigo, unspecified laterality   THERAPY DIAG:  Dizziness and giddiness  Balance problem  Chronic bilateral low back pain without sciatica  Pain in thoracic spine  Muscle weakness (generalized)  History of falling  Cervicalgia  Other headache syndrome  Rationale for Evaluation and Treatment Rehabilitation  SUBJECTIVE:   SUBJECTIVE STATEMENT: Pt reports that she still takes some time in the morning to feel more balanced/less dizzy.  States that she has been going out with friends/family more now.  "I just feel so weird first thing in the morning; I don't feel stable."  Pt accompanied by: self  PERTINENT HISTORY: Dementia, Chronic Neck Pain, BPPV   PAIN:  Are you having pain? Yes: NPRS scale: 0-2/10 Pain location: along cervical to lumbar spine Pain description: soreness Aggravating factors: pain worsened from fall Relieving factors: rest  PRECAUTIONS: Fall  WEIGHT BEARING RESTRICTIONS No  FALLS: Has patient fallen in last 6 months? Yes. Number of falls 1 fall when in shower  LIVING ENVIRONMENT: Lives with: lives alone with small dog Lives in: House/apartment Stairs: Yes: External: 1 steps; no rails at ramp currently, but getting them installed soon Has following equipment at home: Single point cane, Walker - 4 wheeled, bed side commode, and Grab bars  PLOF: Independent  PATIENT GOALS:  To not be as dizzy and feel better.  OBJECTIVE:   DIAGNOSTIC FINDINGS:  Lumbar Radiograph on 12/06/2021: IMPRESSION: 1. Stable spondylosis and facet hypertrophy at the L3-4 and L5-S1 levels, abutting the chronic interbody fusion at L4-5. 2. No acute bony abnormality. 3. Minimal left convex lumbar scoliosis.  Right Hip Radiograph on 12/06/2021: IMPRESSION: 1. Symmetrical bilateral hip osteoarthritis.  No acute fracture.  COGNITION: Overall cognitive  status:  Pt reports that she was doing well cognitively until the Pandemic hit and she started staying in too long, then she started having some word finding problems.  Pt now with diagnosis of dementia.   SENSATION: WFL  POSTURE: rounded shoulders and forward head   Cervical ROM:    Active A/PROM (deg) eval  Flexion 50  Extension 45  Right lateral flexion 30  Left lateral flexion 30  Right rotation 50  Left rotation 45  (Blank rows = not tested)  STRENGTH:  12/07/2021: UE strength grossly 4 to 4+/5 throughout LE strength grossly 4/5 throughout    EDEMA: 01/11/2022: Right ankle:  Figure of 8- 51 cm, Circumference of malleolus- 24 cm Left ankle:  Figure of 8- 48 cm, Circumference of malleolus- 22 cm  GAIT: Gait pattern: decreased step length- Right, decreased step length- Left,  and decreased trunk rotation Distance walked: 100 ft Assistive device utilized: None Level of assistance: Complete Independence Comments:   FUNCTIONAL TESTs:  12/07/2021: 5 times sit to stand: 16 sec with UE use pushing up from chair Timed up and go (TUG): 19.5 sec with [facility]  01/07/2022: 5 times sit to stand: 15.5 sec with UE pushing up from thighs Timed up and go (TUG): 11.5 sec   PATIENT SURVEYS:  12/07/2021: DHI Total Score: 58 / 100 Physical Score: 14 / 28 Emotional Score: 14 / 36 Functional Score: 30 / 36  01/18/2022 DHI Total Score: 36 / 100 Physical Score: 6 / 28 Emotional Score: 12 / 36 Functional Score: 18 / 36  VESTIBULAR ASSESSMENT   GENERAL OBSERVATION: Pt is a pleasant female with noted decreased cervical motion noted during initial evaluation    SYMPTOM BEHAVIOR:   Subjective history: Pt reports that her dizziness is not as bad as it was in June 2023 when she came in for treatment   Non-Vestibular symptoms: neck pain, nausea/vomiting, and migraine symptoms   Type of dizziness: Spinning/Vertigo   Frequency: Pt reports that the dizziness has improved some since it  started last week.  No longer happening daily   Duration: depends on the time   Aggravating factors: Induced by position change: rolling to the left and Induced by motion: occur when walking, sitting in a moving car, and activity in general   Relieving factors: head stationary, slow movements, and avoid busy/distracting environments   Progression of symptoms: better   OCULOMOTOR EXAM:   Ocular Alignment: normal   Ocular ROM: No Limitations   Spontaneous Nystagmus: absent   Gaze-Induced Nystagmus: absent   Smooth Pursuits: intact   Saccades: intact     VESTIBULAR - OCULAR REFLEX:    Positive head thrust test to bilateral sides for nystagmus    POSITIONAL TESTING:  12/07/2021: Right Dix-Hallpike: no nystagmus Left Dix-Hallpike: nystagmus noted and Duration: 20 sec    VESTIBULAR TREATMENT:  01/18/2022: DHI 36/100 Seated chin retraction 2x10 Dix Hallpike negative bilat Standing on purple foam performing head rotations and head nods x1 min each Standing on purple foam performing rows and shoulder extension with green tband 2x10 Tandem gait by barre for occasional UE support 4x length of barre Calf stretch on 2" step 2x20 sec Counter stretch x20 sec Nustep level 3 x5 min with PT present to discuss status   01/11/2022: Measure of ankle edema Dix Hallpike positive on right side (5 sec duration of nystagmus), Proceeded with Epley maneuver x2 Pencil Pushups 2x10 Seated Gaze Stabilization with head nods and head rotations x1 min each Seated chin retraction 2x10 Standing on purple foam performing rows and shoulder extension with green tband 2x10 Standing on purple foam performing head rotations and head nods x1 min each Nustep level 3 x5 min with PT present to discuss status and HEP   01/07/2022: Dix Hallpike negative bilaterally.  Patient wanted to proceed to treatment of Epley maneuver on right side to ensure that she is doing okay. Tandem Gait with occasional UE support on  barre 4 x 8 ft Single leg stance on flat side of half bolster x20 sec bilat "Twister" type weight shifting with 3 color dots on floor Nustep level 3 x5 min with PT present to discuss status and HEP    PATIENT EDUCATION: Education details: Provided with HEP printout Person educated: Patient Education method: Explanation and Handouts Education comprehension: verbalized understanding  HOME EXERCISE PROGRAM: Access Code: [email or web address] URL: [email or web address] Date:  12/07/2021 Prepared by: Juel Burrow  Exercises - Brandt-Daroff Vestibular Exercise  - 1 x daily - 7 x weekly - 1 sets - 5 reps - Seated Gaze Stabilization with Head Nod  - 1 x daily - 7 x weekly - 3 sets - 50 reps - Seated Gaze Stabilization with Head Rotation  - 1 x daily - 7 x weekly - 3 sets - 50 reps - Pencil Pushups  - 1 x daily - 7 x weekly - 2 sets - 10 reps - Standing Gastroc Stretch on Step with Counter Support  - 1 x daily - 7 x weekly - 1 sets - 2 reps - 20 sec hold - Walking Tandem Stance  - 1 x daily - 7 x weekly - 3 sets - 10 reps - Walking with Head Nod  - 1 x daily - 7 x weekly - 3 sets - 10 reps - Walking with Head Rotation  - 1 x daily - 7 x weekly - 3 sets - 10 reps   GOALS: Goals reviewed with patient? Yes  SHORT TERM GOALS: Target date: 12/28/2021   Pt will be independent with initial HEP. Baseline: Goal status: MET  2.  Pt will report at least a 40% improvement in dizziness symptoms. Baseline: Reports 20% improvement on 12/15/2021 Goal status: MET on 12/24/2021   LONG TERM GOALS: Target date: 01/28/2022    Pt will be independent with advanced HEP. Baseline:  Goal status: IN PROGRESS  2.  Pt will improve Dizziness Handicapped Inventory sore to less than 30/100 to indicate decreased dizziness with tasks. Baseline: 54/100 Goal status: IN PROGRESS (see above)  3.  Pt will demonstrate negative Marye Round consistently by last PT treatment session. Baseline:  Goal status: IN  PROGRESS  4.  Pt will report at least an 80% improvement in dizziness during daily tasks and rolling over in bed. Baseline:  Goal status: IN PROGRESS  5.  Pt will increase bilat UE/LE strength to at least 4+/5 grossly throughout to improve ability to perform transfers in home at in community. Baseline: Goal status:  INITIAL  6.  Pt will report pain no greater than 2/10 during grocery shopping and other community outings. Baseline: Goal status:  INITIAL   ASSESSMENT:  CLINICAL IMPRESSION: Ms Mario presents to skilled PT stating that she has been feeling a fullness in her ears and increased hot flashes.  Recommended pt may need to follow up with ENT, as she is still having some symptoms that are not resolving as quickly as last episode.  She verbalizes understanding and states that she has a follow up with her PCP on Friday and will speak with MD at that time.  Patient with negative Marye Round noted on this date.  Pt continues to require CGA with standing on purple foam activities secondary to instability.  Patient denies ankle pain this session and reports edema has improved.  Pt with improved score noted on dizziness handicapped inventory and overall less reports of dizziness.   OBJECTIVE IMPAIRMENTS Abnormal gait, decreased balance, and dizziness.   ACTIVITY LIMITATIONS locomotion level  PARTICIPATION LIMITATIONS: community activity  PERSONAL FACTORS Age, Time since onset of injury/illness/exacerbation, and 1-2 comorbidities: Chronic Neck Pain, Dementia  are also affecting patient's functional outcome.   REHAB POTENTIAL: Good  CLINICAL DECISION MAKING: Evolving/moderate complexity  EVALUATION COMPLEXITY: Moderate   PLAN: PT FREQUENCY: 2x/week  PT DURATION: 8 weeks  PLANNED INTERVENTIONS: Therapeutic exercises, Therapeutic activity, Neuromuscular re-education, Balance training, Gait training, Patient/Family education, Joint  manipulation, Joint mobilization, Stair  training, Vestibular training, Canalith repositioning, Aquatic Therapy, Dry Needling, Electrical stimulation, Spinal manipulation, Spinal mobilization, Cryotherapy, Moist heat, Taping, Traction, Manual therapy, and Re-evaluation  PLAN FOR NEXT SESSION: Marye Round and Canalith repositioning as needed, vestibular rehab, balance   Juel Burrow, PT 01/18/2022, 12:31 PM   [facility] [address] [place], [place] [address] Phone # [phone] Fax [phone]

## 2022-01-19 DIAGNOSIS — H903 Sensorineural hearing loss, bilateral: Secondary | ICD-10-CM | POA: Diagnosis not present

## 2022-01-19 DIAGNOSIS — R42 Dizziness and giddiness: Secondary | ICD-10-CM | POA: Insufficient documentation

## 2022-01-19 DIAGNOSIS — H6123 Impacted cerumen, bilateral: Secondary | ICD-10-CM | POA: Diagnosis not present

## 2022-01-19 DIAGNOSIS — R09A2 Foreign body sensation, throat: Secondary | ICD-10-CM | POA: Diagnosis not present

## 2022-01-20 ENCOUNTER — Ambulatory Visit: Payer: PPO | Admitting: Rehabilitative and Restorative Service Providers"

## 2022-01-20 ENCOUNTER — Encounter: Payer: Self-pay | Admitting: Rehabilitative and Restorative Service Providers"

## 2022-01-20 ENCOUNTER — Ambulatory Visit: Payer: PPO | Admitting: Family Medicine

## 2022-01-20 DIAGNOSIS — G4489 Other headache syndrome: Secondary | ICD-10-CM

## 2022-01-20 DIAGNOSIS — M545 Low back pain, unspecified: Secondary | ICD-10-CM

## 2022-01-20 DIAGNOSIS — R42 Dizziness and giddiness: Secondary | ICD-10-CM

## 2022-01-20 DIAGNOSIS — R2689 Other abnormalities of gait and mobility: Secondary | ICD-10-CM

## 2022-01-20 DIAGNOSIS — M546 Pain in thoracic spine: Secondary | ICD-10-CM

## 2022-01-20 DIAGNOSIS — M542 Cervicalgia: Secondary | ICD-10-CM

## 2022-01-20 DIAGNOSIS — Z9181 History of falling: Secondary | ICD-10-CM

## 2022-01-20 DIAGNOSIS — M6281 Muscle weakness (generalized): Secondary | ICD-10-CM

## 2022-01-20 NOTE — Therapy (Signed)
OUTPATIENT PHYSICAL THERAPY TREATMENT NOTE     Patient Name: Cheryl [name] MRN: [id number] DOB:1940-10-27, 81 y.o., female Today's Date: 01/20/2022  PCP: Hali Marry, MD  REFERRING PROVIDER: Hali Marry, MD    PT End of Session - 01/20/22 [phone]     Visit Number 9    Date for PT Re-Evaluation 01/28/22    Authorization Type [employer]    Progress Note Due on Visit 10    PT Start Time 0845    PT Stop Time 0925    PT Time Calculation (min) 40 min    Activity Tolerance Patient tolerated treatment well    Behavior During Therapy [facility] for tasks assessed/performed             Past Medical History:  Diagnosis Date   Anemia    Anxiety    d/t recent family issues   Arthritis    Bone spur    Carotid stenosis, bilateral 06/06/2019   Chronic back pain    stenosis and OA   Depression    takes Paxil daily   Diverticulosis    Dizzy    occasionally   Encephalitis    at 9 months ago    Fatty liver    has had 2 Hep shots and the final one is in June 18   GERD (gastroesophageal reflux disease)    takes Omeprazole daily   Hepatitis 2012   History of bronchitis    20-25 yrs ago   History of colon polyps    precancerous   History of shingles    Hyperlipidemia    takes Simvastatin daily   IBS (irritable bowel syndrome)    more on side of constipation-takes Miralax daily   Insomnia    takes Melatonin and Ambien nightly   Internal hemorrhoids    Joint pain    Joint swelling    Lower leg edema 09/21/2021   Lung nodules    calcified    Migraine headache    migraines-thinks coming from neck.Last one1/28/18   Orthostatic hypotension 09/21/2021   Pneumonia    hx of-20 yrs ago-=-walking   Renal cyst    left   Uterine cancer ([place]) 1973   Past Surgical History:  Procedure Laterality Date   APPENDECTOMY  1966   cataracts     bilateral   COLONOSCOPY  09/04/2020   EUS  02/10/2011   Procedure: UPPER ENDOSCOPIC ULTRASOUND (EUS) RADIAL;  Surgeon:  Owens Loffler, MD;  Location: [facility];  Service: Endoscopy;  Laterality: N/A;  radial linear    LUMBAR FUSION  03/2016   L4-5, Dr. Ola Spurr.   RECTOCELE REPAIR  2011   ROTATOR CUFF REPAIR  2009   left   TONSILLECTOMY     UPPER GASTROINTESTINAL ENDOSCOPY     VAGINAL HYSTERECTOMY  1973   Patient Active Problem List   Diagnosis Date Noted   History of recurrent UTIs 10/08/2021   Lower leg edema 09/21/2021   Orthostatic hypotension 09/21/2021   DDD (degenerative disc disease), cervical 03/01/2021   Stenosis of cervical spine with myelopathy ([place]) 03/01/2021   Dementia ([place]) 02/15/2021   Pharyngoesophageal dysphagia 12/14/2020   Osteopenia 10/23/2020   Abnormal glucose 08/13/2020   Chronic migraine without aura, with intractable migraine, so stated, with status migrainosus 07/03/2019   Carotid stenosis, bilateral 06/06/2019   Migraine headache 03/22/2019   Daily headache 03/22/2019   Myofascial pain 03/27/2018   Numbness of left foot 11/07/2017   Right foot pain 09/12/2017  Deviated septum 01/25/2017   ETD (Eustachian tube dysfunction), bilateral 01/25/2017   Seasonal allergic rhinitis 01/25/2017   Bilateral hearing loss 01/25/2017   S/P lumbar fusion 06/20/2016   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   Fatty liver 12/22/2015   Onychodystrophy 11/24/2015   Hyperlipidemia 12/17/2013   Osteoarthritis of left knee 07/05/2013   Obesity 03/10/2011   Dyspepsia 02/10/2011   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   PALPITATIONS 02/11/2010   OSTEOPENIA 11/27/2009   CONSTIPATION, CHRONIC 08/15/2008   Insomnia 07/08/2008   Chronic neck pain 04/11/2008   Disorder of kidney and ureter 01/21/2008   HEMORRHOIDS, INTERNAL 01/16/2008   Diverticulosis of colon 01/16/2008   POSTMENOPAUSAL STATUS 12/05/2007   MICROSCOPIC HEMATURIA 06/05/2007   STENOSIS, LUMBAR SPINE 12/22/2005   Anxiety and depression 11/15/2005   IRRITABLE BOWEL SYNDROME 11/15/2005    ARTHRITIS 11/15/2005    ONSET DATE: 12/01/2021  REFERRING DIAG: H81.10 (ICD-10-CM) - Benign paroxysmal positional vertigo, unspecified laterality   THERAPY DIAG:  Dizziness and giddiness  Balance problem  Chronic bilateral low back pain without sciatica  Pain in thoracic spine  Muscle weakness (generalized)  History of falling  Cervicalgia  Other headache syndrome  Rationale for Evaluation and Treatment Rehabilitation  SUBJECTIVE:   SUBJECTIVE STATEMENT: Pt reports she went to the ENT yesterday and she had some ear wax removed.  Pt reports that her dizziness is improving and no longer has dizziness daily.  "Only occasionally a wisp of dizziness"  Pt accompanied by: self  PERTINENT HISTORY: Dementia, Chronic Neck Pain, BPPV   PAIN:  Are you having pain? Yes: NPRS scale: 0-2/10 Pain location: along cervical to lumbar spine Pain description: soreness Aggravating factors: pain worsened from fall Relieving factors: rest  PRECAUTIONS: Fall  WEIGHT BEARING RESTRICTIONS No  FALLS: Has patient fallen in last 6 months? Yes. Number of falls 1 fall when in shower  LIVING ENVIRONMENT: Lives with: lives alone with small dog Lives in: House/apartment Stairs: Yes: External: 1 steps; no rails at ramp currently, but getting them installed soon Has following equipment at home: Single point cane, Walker - 4 wheeled, bed side commode, and Grab bars  PLOF: Independent  PATIENT GOALS:  To not be as dizzy and feel better.  OBJECTIVE:   DIAGNOSTIC FINDINGS:  Lumbar Radiograph on 12/06/2021: IMPRESSION: 1. Stable spondylosis and facet hypertrophy at the L3-4 and L5-S1 levels, abutting the chronic interbody fusion at L4-5. 2. No acute bony abnormality. 3. Minimal left convex lumbar scoliosis.  Right Hip Radiograph on 12/06/2021: IMPRESSION: 1. Symmetrical bilateral hip osteoarthritis.  No acute fracture.  COGNITION: Overall cognitive status:  Pt reports that she was  doing well cognitively until the Pandemic hit and she started staying in too long, then she started having some word finding problems.  Pt now with diagnosis of dementia.   SENSATION: WFL  POSTURE: rounded shoulders and forward head   Cervical ROM:    Active A/PROM (deg) eval  Flexion 50  Extension 45  Right lateral flexion 30  Left lateral flexion 30  Right rotation 50  Left rotation 45  (Blank rows = not tested)  STRENGTH:  12/07/2021: UE strength grossly 4 to 4+/5 throughout LE strength grossly 4/5 throughout    EDEMA: 01/11/2022: Right ankle:  Figure of 8- 51 cm, Circumference of malleolus- 24 cm Left ankle:  Figure of 8- 48 cm, Circumference of malleolus- 22 cm  GAIT: Gait pattern: decreased step length- Right, decreased step length- Left, and decreased trunk rotation Distance walked: 100  ft Assistive device utilized: None Level of assistance: Complete Independence Comments:   FUNCTIONAL TESTs:  12/07/2021: 5 times sit to stand: 16 sec with UE use pushing up from chair Timed up and go (TUG): 19.5 sec with [facility]  01/07/2022: 5 times sit to stand: 15.5 sec with UE pushing up from thighs Timed up and go (TUG): 11.5 sec  01/20/2022: 3 minute walk test:  390 ft 5 times sit to stand: 12.5 sec with UE pushing up from thighs   PATIENT SURVEYS:  12/07/2021: [place] Total Score: 58 / 100 Physical Score: 14 / 28 Emotional Score: 14 / 36 Functional Score: 30 / 36  01/18/2022 DHI Total Score: 36 / 100 Physical Score: 6 / 28 Emotional Score: 12 / 36 Functional Score: 18 / 36  VESTIBULAR ASSESSMENT   GENERAL OBSERVATION: Pt is a pleasant female with noted decreased cervical motion noted during initial evaluation    SYMPTOM BEHAVIOR:   Subjective history: Pt reports that her dizziness is not as bad as it was in June 2023 when she came in for treatment   Non-Vestibular symptoms: neck pain, nausea/vomiting, and migraine symptoms   Type of dizziness:  Spinning/Vertigo   Frequency: Pt reports that the dizziness has improved some since it started last week.  No longer happening daily   Duration: depends on the time   Aggravating factors: Induced by position change: rolling to the left and Induced by motion: occur when walking, sitting in a moving car, and activity in general   Relieving factors: head stationary, slow movements, and avoid busy/distracting environments   Progression of symptoms: better   OCULOMOTOR EXAM:   Ocular Alignment: normal   Ocular ROM: No Limitations   Spontaneous Nystagmus: absent   Gaze-Induced Nystagmus: absent   Smooth Pursuits: intact   Saccades: intact     VESTIBULAR - OCULAR REFLEX:    Positive head thrust test to bilateral sides for nystagmus    POSITIONAL TESTING:  12/07/2021: Right Dix-Hallpike: no nystagmus Left Dix-Hallpike: nystagmus noted and Duration: 20 sec    VESTIBULAR TREATMENT:  01/20/2022: Seated chin retraction and scapular retraction  2x10 each Seated with 2# on ankles:  heel/toe raises, marching, and LAQ.  BLE 2x10 each 3 minute ambulation around PT gyms for 390 ft Sit to/from stand 2x5 [employer] negative bilat Nustep level 3 x5 min with PT present to discuss status   01/18/2022: DHI 36/100 Seated chin retraction 2x10 Dix Hallpike negative bilat Standing on purple foam performing head rotations and head nods x1 min each Standing on purple foam performing rows and shoulder extension with green tband 2x10 Tandem gait by barre for occasional UE support 4x length of barre Calf stretch on 2" step 2x20 sec Counter stretch x20 sec Nustep level 3 x5 min with PT present to discuss status   01/11/2022: Measure of ankle edema Dix Hallpike positive on right side (5 sec duration of nystagmus), Proceeded with Epley maneuver x2 Pencil Pushups 2x10 Seated Gaze Stabilization with head nods and head rotations x1 min each Seated chin retraction 2x10 Standing on purple foam  performing rows and shoulder extension with green tband 2x10 Standing on purple foam performing head rotations and head nods x1 min each Nustep level 3 x5 min with PT present to discuss status and HEP    PATIENT EDUCATION: Education details: Provided with HEP printout Person educated: Patient Education method: Explanation and Handouts Education comprehension: verbalized understanding  HOME EXERCISE PROGRAM: Access Code: [email or web address] URL: [email or web address] Date: 12/07/2021 Prepared by: Shelby Dubin  Mack Thurmon  Exercises - Brandt-Daroff Vestibular Exercise  - 1 x daily - 7 x weekly - 1 sets - 5 reps - Seated Gaze Stabilization with Head Nod  - 1 x daily - 7 x weekly - 3 sets - 50 reps - Seated Gaze Stabilization with Head Rotation  - 1 x daily - 7 x weekly - 3 sets - 50 reps - Pencil Pushups  - 1 x daily - 7 x weekly - 2 sets - 10 reps - Standing Gastroc Stretch on Step with Counter Support  - 1 x daily - 7 x weekly - 1 sets - 2 reps - 20 sec hold - Walking Tandem Stance  - 1 x daily - 7 x weekly - 3 sets - 10 reps - Walking with Head Nod  - 1 x daily - 7 x weekly - 3 sets - 10 reps - Walking with Head Rotation  - 1 x daily - 7 x weekly - 3 sets - 10 reps   GOALS: Goals reviewed with patient? Yes  SHORT TERM GOALS: Target date: 12/28/2021   Pt will be independent with initial HEP. Baseline: Goal status: MET  2.  Pt will report at least a 40% improvement in dizziness symptoms. Baseline: Reports 20% improvement on 12/15/2021 Goal status: MET on 12/24/2021   LONG TERM GOALS: Target date: 01/28/2022    Pt will be independent with advanced HEP. Baseline:  Goal status: IN PROGRESS  2.  Pt will improve Dizziness Handicapped Inventory sore to less than 30/100 to indicate decreased dizziness with tasks. Baseline: 54/100 Goal status: IN PROGRESS (see above)  3.  Pt will demonstrate negative Marye Round consistently by last PT treatment session. Baseline:  Goal status:  IN PROGRESS  4.  Pt will report at least an 80% improvement in dizziness during daily tasks and rolling over in bed. Baseline:  Goal status: IN PROGRESS  5.  Pt will increase bilat UE/LE strength to at least 4+/5 grossly throughout to improve ability to perform transfers in home at in community. Baseline: Goal status:  INITIAL  6.  Pt will report pain no greater than 2/10 during grocery shopping and other community outings. Baseline: Goal status:  MET on 01/20/2022   ASSESSMENT:  CLINICAL IMPRESSION: Ms Jasmer presents to skilled PT stating that she did follow up with ENT and had some ear wax removed.  Patient continues with a negative Marye Round noted during session for 2nd consecutive visit.  Patient able to progress today with increased strengthening exercises.  Pt states that she can go to the grocery store and use the shopping cart to complete her shopping without increased pain on most typical days.  Educated pt to increase her activity during the day, including taking her dog for walks, she verbalizes her understanding.  Patient to come back in a week for assessment and will discharge if all goals met.   OBJECTIVE IMPAIRMENTS Abnormal gait, decreased balance, and dizziness.   ACTIVITY LIMITATIONS locomotion level  PARTICIPATION LIMITATIONS: community activity  PERSONAL FACTORS Age, Time since onset of injury/illness/exacerbation, and 1-2 comorbidities: Chronic Neck Pain, Dementia  are also affecting patient's functional outcome.   REHAB POTENTIAL: Good  CLINICAL DECISION MAKING: Evolving/moderate complexity  EVALUATION COMPLEXITY: Moderate   PLAN: PT FREQUENCY: 2x/week  PT DURATION: 8 weeks  PLANNED INTERVENTIONS: Therapeutic exercises, Therapeutic activity, Neuromuscular re-education, Balance training, Gait training, Patient/Family education, Joint manipulation, Joint mobilization, Stair training, Vestibular training, Canalith repositioning, Aquatic Therapy, Dry  Needling, Electrical stimulation, Spinal manipulation,  Spinal mobilization, Cryotherapy, Moist heat, Taping, Traction, Manual therapy, and Re-evaluation  PLAN FOR NEXT SESSION: Marye Round and Canalith repositioning as needed, reassessment   Juel Burrow, PT 01/20/2022, 9:39 AM   [facility] [address] [place] [place], [place] [address] Phone # [phone] Fax [phone]

## 2022-01-21 ENCOUNTER — Ambulatory Visit (INDEPENDENT_AMBULATORY_CARE_PROVIDER_SITE_OTHER): Payer: PPO | Admitting: Family Medicine

## 2022-01-21 ENCOUNTER — Encounter: Payer: Self-pay | Admitting: Family Medicine

## 2022-01-21 VITALS — BP 108/55 | HR 67 | Ht 65.0 in | Wt 186.8 lb

## 2022-01-21 DIAGNOSIS — M4802 Spinal stenosis, cervical region: Secondary | ICD-10-CM | POA: Diagnosis not present

## 2022-01-21 DIAGNOSIS — F03B4 Unspecified dementia, moderate, with anxiety: Secondary | ICD-10-CM | POA: Diagnosis not present

## 2022-01-21 DIAGNOSIS — J3489 Other specified disorders of nose and nasal sinuses: Secondary | ICD-10-CM | POA: Diagnosis not present

## 2022-01-21 DIAGNOSIS — F419 Anxiety disorder, unspecified: Secondary | ICD-10-CM | POA: Diagnosis not present

## 2022-01-21 DIAGNOSIS — F32A Depression, unspecified: Secondary | ICD-10-CM | POA: Diagnosis not present

## 2022-01-21 DIAGNOSIS — G992 Myelopathy in diseases classified elsewhere: Secondary | ICD-10-CM

## 2022-01-21 MED ORDER — TRAMADOL HCL 50 MG PO TABS: 50.0000 mg | ORAL_TABLET | Freq: Two times a day (BID) | ORAL | 1 refills | Status: DC | PRN

## 2022-01-21 MED ORDER — FLUTICASONE PROPIONATE 50 MCG/ACT NA SUSP: 1.0000 | Freq: Every day | NASAL | 12 refills | Status: DC

## 2022-01-21 NOTE — Assessment & Plan Note (Signed)
RF tramadol.  Use prn.

## 2022-01-21 NOTE — Assessment & Plan Note (Signed)
Doing really well on Exelon patcgh.

## 2022-01-21 NOTE — Progress Notes (Signed)
   Established Patient Office Visit  Subjective   Patient ID: Cheryl [name], female    DOB: November 18, 1940  Age: 81 y.o. MRN: [id number]  Chief Complaint  Patient presents with   Memory Loss    HPI F/U new start Lexapro -it took [name] a while to transition to Lexapro but she is doing well she is still just on 5 mg but will transition to the 10 mg soon she does have the prescription at home.  F/U dementia - Had nightmares on Aricept. Now on Exelon and doing well.  Stable. She personally feels like she has had improvement in her memory.  Patch is staying on most days but occ comes off early.   Had stopped flonase and runny nose started back. Would like to restart the medication.   Would like to have more tramadol to use PRN for back pain. Has used on and off for years.       ROS    Objective:     BP (!) 108/55 (BP Location: Right Leg, Patient Position: Sitting, Cuff Size: Large)   Pulse 67   Ht '5\' 5"'$  (1.651 m)   Wt 186 lb 12.8 oz (84.7 kg)   SpO2 94%   BMI 31.09 kg/m    Physical Exam Vitals and nursing note reviewed.  Constitutional:      Appearance: She is well-developed.  HENT:     Head: Normocephalic and atraumatic.  Cardiovascular:     Rate and Rhythm: Normal rate and regular rhythm.     Heart sounds: Normal heart sounds.  Pulmonary:     Effort: Pulmonary effort is normal.     Breath sounds: Normal breath sounds.  Skin:    General: Skin is warm and dry.  Neurological:     Mental Status: She is alert and oriented to person, place, and time.  Psychiatric:        Behavior: Behavior normal.      No results found for any visits on 01/21/22.    The 10-year ASCVD risk score (Arnett DK, et al., 2019) is: 17.5%    Assessment & Plan:   Problem List Items Addressed This Visit       Nervous and Auditory   Stenosis of cervical spine with myelopathy ([facility]) - Primary    RF tramadol.  Use prn.       Relevant Medications   traMADol (ULTRAM) 50 MG tablet   Dementia  ([place])    Doing really well on Exelon patcgh.         Other   Anxiety and depression    Doing well so far on lexapro '5mg'$ .  Plan to inc to '10mg'$ .  F/U in 6 months.        Other Visit Diagnoses     Rhinorrhea       Relevant Medications   fluticasone (FLONASE) 50 MCG/ACT nasal spray       Return in about 6 months (around 07/23/2022) for Mood medication .    [name], MD

## 2022-01-21 NOTE — Assessment & Plan Note (Signed)
Doing well so far on lexapro '5mg'$ .  Plan to inc to '10mg'$ .  F/U in 6 months.

## 2022-01-25 ENCOUNTER — Encounter: Payer: Self-pay | Admitting: Rehabilitative and Restorative Service Providers"

## 2022-01-25 ENCOUNTER — Encounter: Payer: PPO | Admitting: Rehabilitative and Restorative Service Providers"

## 2022-01-25 ENCOUNTER — Ambulatory Visit: Payer: PPO | Admitting: Rehabilitative and Restorative Service Providers"

## 2022-01-25 DIAGNOSIS — R2689 Other abnormalities of gait and mobility: Secondary | ICD-10-CM

## 2022-01-25 DIAGNOSIS — G4489 Other headache syndrome: Secondary | ICD-10-CM

## 2022-01-25 DIAGNOSIS — M6281 Muscle weakness (generalized): Secondary | ICD-10-CM

## 2022-01-25 DIAGNOSIS — G8929 Other chronic pain: Secondary | ICD-10-CM

## 2022-01-25 DIAGNOSIS — M546 Pain in thoracic spine: Secondary | ICD-10-CM

## 2022-01-25 DIAGNOSIS — Z9181 History of falling: Secondary | ICD-10-CM

## 2022-01-25 DIAGNOSIS — M545 Low back pain, unspecified: Secondary | ICD-10-CM

## 2022-01-25 DIAGNOSIS — R42 Dizziness and giddiness: Secondary | ICD-10-CM

## 2022-01-25 DIAGNOSIS — M542 Cervicalgia: Secondary | ICD-10-CM

## 2022-01-25 NOTE — Therapy (Signed)
OUTPATIENT PHYSICAL THERAPY TREATMENT NOTE AND DISCHARGE SUMMARY     Patient Name: Cheryl [name] MRN: [id number] DOB:01/07/1941, 81 y.o., female Today's Date: 01/25/2022  PCP: Hali Marry, MD  REFERRING PROVIDER: Hali Marry, MD    PT End of Session - 01/25/22 1105     Visit Number 10    Date for PT Re-Evaluation 01/28/22    Authorization Type [employer]    Progress Note Due on Visit 10    PT Start Time 1100    PT Stop Time 1140    PT Time Calculation (min) 40 min    Activity Tolerance Patient tolerated treatment well    Behavior During Therapy WFL for tasks assessed/performed             Past Medical History:  Diagnosis Date   Anemia    Anxiety    d/t recent family issues   Arthritis    Bone spur    Carotid stenosis, bilateral 06/06/2019   Chronic back pain    stenosis and OA   Depression    takes Paxil daily   Diverticulosis    Dizzy    occasionally   Encephalitis    at 9 months ago    Fatty liver    has had 2 Hep shots and the final one is in June 18   GERD (gastroesophageal reflux disease)    takes Omeprazole daily   Hepatitis 2012   History of bronchitis    20-25 yrs ago   History of colon polyps    precancerous   History of shingles    Hyperlipidemia    takes Simvastatin daily   IBS (irritable bowel syndrome)    more on side of constipation-takes Miralax daily   Insomnia    takes Melatonin and Ambien nightly   Internal hemorrhoids    Joint pain    Joint swelling    Lower leg edema 09/21/2021   Lung nodules    calcified    Migraine headache    migraines-thinks coming from neck.Last one1/28/18   Orthostatic hypotension 09/21/2021   Pneumonia    hx of-20 yrs ago-=-walking   Renal cyst    left   Uterine cancer ([place]) 1973   Past Surgical History:  Procedure Laterality Date   APPENDECTOMY  1966   cataracts     bilateral   COLONOSCOPY  09/04/2020   EUS  02/10/2011   Procedure: UPPER ENDOSCOPIC ULTRASOUND  (EUS) RADIAL;  Surgeon: Owens Loffler, MD;  Location: [facility];  Service: Endoscopy;  Laterality: N/A;  radial linear    LUMBAR FUSION  03/2016   L4-5, Dr. Ola Spurr.   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/01/2021   Dementia ([place]) 02/15/2021   Pharyngoesophageal dysphagia 12/14/2020   Osteopenia 10/23/2020   Abnormal glucose 08/13/2020   Chronic migraine without aura, with intractable migraine, so stated, with status migrainosus 07/03/2019   Carotid stenosis, bilateral 06/06/2019   Migraine headache 03/22/2019   Daily headache 03/22/2019   Myofascial pain 03/27/2018   Numbness of left foot 11/07/2017   Right foot pain  09/12/2017   Deviated septum 01/25/2017   ETD (Eustachian tube dysfunction), bilateral 01/25/2017   Seasonal allergic rhinitis 01/25/2017   Bilateral hearing loss 01/25/2017   S/P lumbar fusion 06/20/2016   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   Fatty liver 12/22/2015   Onychodystrophy 11/24/2015   Hyperlipidemia 12/17/2013   Osteoarthritis of left knee 07/05/2013   Obesity 03/10/2011   Dyspepsia 02/10/2011   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   PALPITATIONS 02/11/2010   OSTEOPENIA 11/27/2009   CONSTIPATION, CHRONIC 08/15/2008   Insomnia 07/08/2008   Chronic neck pain 04/11/2008   Disorder of kidney and ureter 01/21/2008   HEMORRHOIDS, INTERNAL 01/16/2008   Diverticulosis of colon 01/16/2008   POSTMENOPAUSAL STATUS 12/05/2007   MICROSCOPIC HEMATURIA 06/05/2007   STENOSIS, LUMBAR SPINE 12/22/2005   Anxiety and depression 11/15/2005   IRRITABLE BOWEL  SYNDROME 11/15/2005   ARTHRITIS 11/15/2005    ONSET DATE: 12/01/2021  REFERRING DIAG: H81.10 (ICD-10-CM) - Benign paroxysmal positional vertigo, unspecified laterality   THERAPY DIAG:  Dizziness and giddiness  Balance problem  Chronic bilateral low back pain without sciatica  Pain in thoracic spine  Muscle weakness (generalized)  History of falling  Cervicalgia  Other headache syndrome  Rationale for Evaluation and Treatment Rehabilitation  SUBJECTIVE:   SUBJECTIVE STATEMENT: Pt denies dizziness at this time.  States that she is ready for discharge.  Pt accompanied by: self  PERTINENT HISTORY: Dementia, Chronic Neck Pain, BPPV   PAIN:  Are you having pain? Yes: NPRS scale: 0-2/10 Pain location: along cervical to lumbar spine Pain description: soreness Aggravating factors: pain worsened from fall Relieving factors: rest  PRECAUTIONS: Fall  WEIGHT BEARING RESTRICTIONS No  FALLS: Has patient fallen in last 6 months? Yes. Number of falls 1 fall when in shower  LIVING ENVIRONMENT: Lives with: lives alone with small dog Lives in: House/apartment Stairs: Yes: External: 1 steps; no rails at ramp currently, but getting them installed soon Has following equipment at home: Single point cane, Walker - 4 wheeled, bed side commode, and Grab bars  PLOF: Independent  PATIENT GOALS:  To not be as dizzy and feel better.  OBJECTIVE:   DIAGNOSTIC FINDINGS:  Lumbar Radiograph on 12/06/2021: IMPRESSION: 1. Stable spondylosis and facet hypertrophy at the L3-4 and L5-S1 levels, abutting the chronic interbody fusion at L4-5. 2. No acute bony abnormality. 3. Minimal left convex lumbar scoliosis.  Right Hip Radiograph on 12/06/2021: IMPRESSION: 1. Symmetrical bilateral hip osteoarthritis.  No acute fracture.  COGNITION: Overall cognitive status:  Pt reports that she was doing well cognitively until the Pandemic hit and she started staying in too long, then she started  having some word finding problems.  Pt now with diagnosis of dementia.   SENSATION: WFL  POSTURE: rounded shoulders and forward head   Cervical ROM:    Active A/PROM (deg) eval  Flexion 50  Extension 45  Right lateral flexion 30  Left lateral flexion 30  Right rotation 50  Left rotation 45  (Blank rows = not tested)  STRENGTH:  12/07/2021: UE strength grossly 4 to 4+/5 throughout LE strength grossly 4/5 throughout  01/25/2022: Bilat UE/LE is grossly 4+/5 throughout  EDEMA: 01/11/2022: Right ankle:  Figure of 8- 51 cm, Circumference of malleolus- 24 cm Left ankle:  Figure of 8- 48 cm, Circumference of malleolus- 22 cm  GAIT: Gait pattern: decreased step length- Right, decreased step length- Left, and decreased trunk rotation Distance walked: 100 ft Assistive device utilized: None Level of assistance: Complete Independence Comments:  FUNCTIONAL TESTs:  12/07/2021: 5 times sit to stand: 16 sec with UE use pushing up from chair Timed up and go (TUG): 19.5 sec with [facility]  01/07/2022: 5 times sit to stand: 15.5 sec with UE pushing up from thighs Timed up and go (TUG): 11.5 sec  01/20/2022: 3 minute walk test:  390 ft 5 times sit to stand: 12.5 sec with UE pushing up from thighs    PATIENT SURVEYS:  12/07/2021: [facility] Total Score: 58 / 100 Physical Score: 14 / 28 Emotional Score: 14 / 36 Functional Score: 30 / 36  01/18/2022 DHI Total Score: 36 / 100 Physical Score: 6 / 28 Emotional Score: 12 / 36 Functional Score: 18 / 36  01/25/2022: DHI Total Score: 10 / 100 Physical Score: 0 / 28 Emotional Score: 4 / 36 Functional Score: 6 / 36  VESTIBULAR ASSESSMENT   GENERAL OBSERVATION: Pt is a pleasant female with noted decreased cervical motion noted during initial evaluation    SYMPTOM BEHAVIOR:   Subjective history: Pt reports that her dizziness is not as bad as it was in June 2023 when she came in for treatment   Non-Vestibular symptoms: neck pain,  nausea/vomiting, and migraine symptoms   Type of dizziness: Spinning/Vertigo   Frequency: Pt reports that the dizziness has improved some since it started last week.  No longer happening daily   Duration: depends on the time   Aggravating factors: Induced by position change: rolling to the left and Induced by motion: occur when walking, sitting in a moving car, and activity in general   Relieving factors: head stationary, slow movements, and avoid busy/distracting environments   Progression of symptoms: better   OCULOMOTOR EXAM:   Ocular Alignment: normal   Ocular ROM: No Limitations   Spontaneous Nystagmus: absent   Gaze-Induced Nystagmus: absent   Smooth Pursuits: intact   Saccades: intact     VESTIBULAR - OCULAR REFLEX:    Positive head thrust test to bilateral sides for nystagmus    POSITIONAL TESTING:  12/07/2021: Right Dix-Hallpike: no nystagmus Left Dix-Hallpike: nystagmus noted and Duration: 20 sec    VESTIBULAR TREATMENT:  01/25/2022: Nustep level 3 x6 min with PT present to discuss status Sit to/from stand 2x5 DHI 10/100 Seated with 2# on ankles:  heel/toe raises, marching, and LAQ.  BLE 2x10 each Seated 4 way ankle resistance with yellow tband x10 bilat Seated bicep curls with yellow tband x10 bilat Ambulation down length of PT hallway x2 laps each with head nods, then head rotations   01/20/2022: Seated chin retraction and scapular retraction  2x10 each Seated with 2# on ankles:  heel/toe raises, marching, and LAQ.  BLE 2x10 each 3 minute ambulation around PT gyms for 390 ft Sit to/from stand 2x5 [employer] negative bilat Nustep level 3 x5 min with PT present to discuss status   01/18/2022: DHI 36/100 Seated chin retraction 2x10 Dix Hallpike negative bilat Standing on purple foam performing head rotations and head nods x1 min each Standing on purple foam performing rows and shoulder extension with green tband 2x10 Tandem gait by barre for occasional  UE support 4x length of barre Calf stretch on 2" step 2x20 sec Counter stretch x20 sec Nustep level 3 x5 min with PT present to discuss status    PATIENT EDUCATION: Education details: Provided with HEP printout Person educated: Patient Education method: Explanation and Handouts Education comprehension: verbalized understanding  HOME EXERCISE PROGRAM: Access Code: [email or web address] URL: [email or web address] Date: 01/25/2022 Prepared by: Juel Burrow  Exercises - Brandt-Daroff Vestibular Exercise  - 1 x daily - 7 x weekly - 1 sets - 5 reps - Seated Gaze Stabilization with Head Nod  - 1 x daily - 7 x weekly - 3 sets - 50 reps - Seated Gaze Stabilization with Head Rotation  - 1 x daily - 7 x weekly - 3 sets - 50 reps - Pencil Pushups  - 1 x daily - 7 x weekly - 2 sets - 10 reps - Standing Gastroc Stretch on Step with Counter Support  - 1 x daily - 7 x weekly - 1 sets - 2 reps - 20 sec hold - Walking Tandem Stance  - 1 x daily - 7 x weekly - 3 sets - 10 reps - Walking with Head Nod  - 1 x daily - 7 x weekly - 3 sets - 10 reps - Walking with Head Rotation  - 1 x daily - 7 x weekly - 3 sets - 10 reps - Heel Toe Raises with Counter Support  - 1 x daily - 7 x weekly - 2 sets - 10 reps - Seated Ankle Dorsiflexion with Resistance  - 1 x daily - 7 x weekly - 2 sets - 10 reps - Ankle Eversion with Resistance  - 1 x daily - 7 x weekly - 2 sets - 10 reps - Ankle Inversion with Resistance  - 1 x daily - 7 x weekly - 2 sets - 10 reps - Seated Single Arm Bicep Curls with Rotation and Dumbbell  - 1 x daily - 7 x weekly - 2 sets - 10 reps - Shoulder External Rotation and Scapular Retraction with Resistance  - 1 x daily - 7 x weekly - 2 sets - 10 reps   GOALS: Goals reviewed with patient? Yes  SHORT TERM GOALS: Target date: 12/28/2021   Pt will be independent with initial HEP. Baseline: Goal status: MET  2.  Pt will report at least a 40% improvement in dizziness symptoms. Baseline:  Reports 20% improvement on 12/15/2021 Goal status: MET on 12/24/2021   LONG TERM GOALS: Target date: 01/28/2022    Pt will be independent with advanced HEP. Baseline:  Goal status: MET  2.  Pt will improve Dizziness Handicapped Inventory sore to less than 30/100 to indicate decreased dizziness with tasks. Baseline: 54/100 Goal status: MET (see above)  3.  Pt will demonstrate negative Marye Round consistently by last PT treatment session. Baseline:  Goal status: MET  4.  Pt will report at least an 80% improvement in dizziness during daily tasks and rolling over in bed. Baseline:  Goal status: MET  5.  Pt will increase bilat UE/LE strength to at least 4+/5 grossly throughout to improve ability to perform transfers in home at in community. Baseline: Goal status:  MET  6.  Pt will report pain no greater than 2/10 during grocery shopping and other community outings. Baseline: Goal status:  MET on 01/20/2022   ASSESSMENT:  CLINICAL IMPRESSION: Ms Daloia presents to skilled PT reporting that her dizziness has improved at least 80% since initial evaluation.  Patient has made great progress with skilled PT and has met all goals at this time.  Dizziness Handicapped Inventory has improved greatly since initial evaluation.  Patient is independent and compliant with HEP and is ready to discharge from PT at this time.   OBJECTIVE IMPAIRMENTS Abnormal gait, decreased balance, and dizziness.   ACTIVITY LIMITATIONS locomotion level  PARTICIPATION LIMITATIONS: community activity  PERSONAL FACTORS  Age, Time since onset of injury/illness/exacerbation, and 1-2 comorbidities: Chronic Neck Pain, Dementia  are also affecting patient's functional outcome.   REHAB POTENTIAL: Good  CLINICAL DECISION MAKING: Evolving/moderate complexity  EVALUATION COMPLEXITY: Moderate   PLAN: PT FREQUENCY: 2x/week  PT DURATION: 8 weeks  PLANNED INTERVENTIONS: Therapeutic exercises, Therapeutic  activity, Neuromuscular re-education, Balance training, Gait training, Patient/Family education, Joint manipulation, Joint mobilization, Stair training, Vestibular training, Canalith repositioning, Aquatic Therapy, Dry Needling, Electrical stimulation, Spinal manipulation, Spinal mobilization, Cryotherapy, Moist heat, Taping, Traction, Manual therapy, and Re-evaluation  PLAN FOR NEXT SESSION: Discharge completed on 01/25/2022    PHYSICAL THERAPY DISCHARGE SUMMARY  Patient agrees to discharge. Patient goals were met. Patient is being discharged due to meeting the stated rehab goals.    Juel Burrow, PT 01/25/2022, 12:29 PM   [facility] [address] [place], [place] [address] Phone # [phone] Fax [phone]

## 2022-01-27 ENCOUNTER — Encounter: Payer: PPO | Admitting: Rehabilitative and Restorative Service Providers"

## 2022-02-14 ENCOUNTER — Telehealth: Payer: Self-pay

## 2022-02-14 NOTE — Patient Outreach (Signed)
  Care Coordination   02/14/2022 Name: AYRA HODGDON MRN: [id number] DOB: Oct 05, 1940   Care Coordination Outreach Attempts:  An unsuccessful telephone outreach was attempted today to offer the patient information about available care coordination services as a benefit of their health plan.   Follow Up Plan:  Additional outreach attempts will be made to offer the patient care coordination information and services.   Encounter Outcome:  No Answer   Care Coordination Interventions:  No, not indicated    Thea Silversmith, RN, MSN, BSN, [place] Coordinator [phone]

## 2022-02-24 ENCOUNTER — Telehealth: Payer: Self-pay

## 2022-02-24 NOTE — Patient Outreach (Signed)
  Care Coordination   Initial Visit Note   02/24/2022 Name: Cheryl [name] MRN: [id number] DOB: 26-Sep-1940  Cheryl [name] is a 82 y.o. year old female who sees Metheney, Rene Kocher, MD for primary care. I spoke with  Cheryl [name] by phone today.  What matters to the patients health and wellness today?  Cheryl [name] denies any care coordination: disease management or resource needs at this time. Cheryl [name] to contact RNCM if care coordination needs in the future.   Goals Addressed             This Visit's Progress    COMPLETED: Care Coordination Activities       Care Coordination Interventions: Care Coordination Program discussed with Cheryl [name] RNCM encouraged Cheryl [name] to call RNCM or primary care provider if care coordination needs in the future         SDOH assessments and interventions completed:  Yes  SDOH Interventions Today    Flowsheet Row Most Recent Value  SDOH Interventions   Food Insecurity Interventions Intervention Not Indicated  Housing Interventions Intervention Not Indicated  Transportation Interventions Intervention Not Indicated  Utilities Interventions Intervention Not Indicated     Care Coordination Interventions:  Yes, provided   Follow up plan: No further intervention required.   Encounter Outcome:  Pt. Visit Completed   Thea Silversmith, RN, MSN, BSN, CCM Care Management Coordinator [phone]

## 2022-02-26 ENCOUNTER — Other Ambulatory Visit: Payer: Self-pay | Admitting: Family Medicine

## 2022-02-28 ENCOUNTER — Other Ambulatory Visit: Payer: Self-pay | Admitting: *Deleted

## 2022-03-14 ENCOUNTER — Telehealth: Payer: Self-pay | Admitting: Adult Health

## 2022-03-14 ENCOUNTER — Ambulatory Visit: Payer: PPO | Admitting: Adult Health

## 2022-03-14 NOTE — Telephone Encounter (Signed)
Pt cancelled appt due having a stomach bug.

## 2022-03-29 ENCOUNTER — Ambulatory Visit (INDEPENDENT_AMBULATORY_CARE_PROVIDER_SITE_OTHER): Payer: PPO | Admitting: Family Medicine

## 2022-03-29 ENCOUNTER — Encounter: Payer: Self-pay | Admitting: Family Medicine

## 2022-03-29 VITALS — BP 114/61 | HR 71 | Ht 65.0 in | Wt 185.6 lb

## 2022-03-29 DIAGNOSIS — Z87440 Personal history of urinary (tract) infections: Secondary | ICD-10-CM

## 2022-03-29 DIAGNOSIS — R319 Hematuria, unspecified: Secondary | ICD-10-CM

## 2022-03-29 DIAGNOSIS — R3911 Hesitancy of micturition: Secondary | ICD-10-CM

## 2022-03-29 LAB — POCT URINALYSIS DIP (CLINITEK)
Glucose, UA: 100 mg/dL — AB
Ketones, POC UA: NEGATIVE mg/dL
Nitrite, UA: NEGATIVE
POC PROTEIN,UA: 100 — AB
Spec Grav, UA: >=1.03 — AB (ref 1.010–1.025)
Urobilinogen, UA: 0.2 E.U./dL
pH, UA: 6 (ref 5.0–8.0)

## 2022-03-29 MED ORDER — NITROFURANTOIN MONOHYD MACRO 100 MG PO CAPS: 100.0000 mg | ORAL_CAPSULE | Freq: Two times a day (BID) | ORAL | 0 refills | Status: DC

## 2022-03-29 NOTE — Progress Notes (Signed)
   Acute Office Visit  Subjective:     Patient ID: Cheryl [name], female    DOB: 23-Apr-1940, 82 y.o.   MRN: [id number]  Chief Complaint  Patient presents with   Urinary Tract Infection    Started this morning Pain with urinating.      HPI Patient is in today for dysuria that started this morning.  No gross hematuria no fevers or chills or sweats.  She has chronic back pains that does not feel different than its usual.  Did start drinking some cranberry juice this morning.  ROS      Objective:    BP 114/61 (BP Location: Left Arm, Patient Position: Sitting, Cuff Size: Small)   Pulse 71   Ht 5' 5"$  (1.651 m)   Wt 185 lb 9.6 oz (84.2 kg)   SpO2 96%   BMI 30.89 kg/m    Physical Exam  Results for orders placed or performed in visit on 03/29/22  POCT URINALYSIS DIP (CLINITEK)  Result Value Ref Range   Color, UA straw (A) yellow   Clarity, UA cloudy (A) clear   Glucose, UA =100 (A) negative mg/dL   Bilirubin, UA small (A) negative   Ketones, POC UA negative negative mg/dL   Spec Grav, UA >=1.030 (A) 1.010 - 1.025   Blood, UA large (A) negative   pH, UA 6.0 5.0 - 8.0   POC PROTEIN,UA =100 (A) negative, trace   Urobilinogen, UA 0.2 0.2 or 1.0 E.U./dL   Nitrite, UA Negative Negative   Leukocytes, UA Small (1+) (A) Negative        Assessment & Plan:   Problem List Items Addressed This Visit       Other   History of recurrent UTIs - Primary   Relevant Orders   POCT URINALYSIS DIP (CLINITEK) (Completed)   Urine Culture   Ambulatory referral to Urology   Other Visit Diagnoses     Urinary hesitancy       Relevant Orders   Ambulatory referral to Urology   Hematuria, unspecified type       Relevant Orders   Urine Culture   Ambulatory referral to Urology      Urinalysis shows blood, leukocytes, protein and some glucose.  There is not enough urine to send for a culture so we will treat based on her last culture result. Will tx with Macrobid.   Meds ordered this  encounter  Medications   nitrofurantoin, macrocrystal-monohydrate, (MACROBID) 100 MG capsule    Sig: Take 1 capsule (100 mg total) by mouth 2 (two) times daily.    Dispense:  10 capsule    Refill:  0    No follow-ups on file.  [name], MD

## 2022-03-31 LAB — URINE CULTURE
MICRO NUMBER:: 14589481
SPECIMEN QUALITY:: ADEQUATE

## 2022-03-31 NOTE — Progress Notes (Signed)
Hi Geni, your urine culture came back positive for E. coli.  It was a very tiny amount of bacteria.  In fact this would actually be considered a negative culture.  But it is sensitive to the antibiotic that I put you on and I would like you to finish the antibiotic that you started.  Hopefully you are feeling better.

## 2022-04-13 ENCOUNTER — Encounter: Payer: Self-pay | Admitting: Family Medicine

## 2022-04-13 ENCOUNTER — Other Ambulatory Visit: Payer: Self-pay | Admitting: Family Medicine

## 2022-04-14 MED ORDER — NITROFURANTOIN MONOHYD MACRO 100 MG PO CAPS: 100.0000 mg | ORAL_CAPSULE | Freq: Two times a day (BID) | ORAL | 0 refills | Status: DC

## 2022-04-14 NOTE — Telephone Encounter (Signed)
Meds ordered this encounter  Medications   nitrofurantoin, macrocrystal-monohydrate, (MACROBID) 100 MG capsule    Sig: Take 1 capsule (100 mg total) by mouth 2 (two) times daily.    Dispense:  10 capsule    Refill:  0

## 2022-04-19 ENCOUNTER — Other Ambulatory Visit: Payer: Self-pay | Admitting: Family Medicine

## 2022-04-19 DIAGNOSIS — Z139 Encounter for screening, unspecified: Secondary | ICD-10-CM

## 2022-04-19 NOTE — Progress Notes (Unsigned)
PATIENT: ZYNASIA GIECK DOB: Mar 04, 1940  REASON FOR VISIT: follow up HISTORY FROM: patient  No chief complaint on file.    HISTORY OF PRESENT ILLNESS: Today 04/19/22:  ARELIA PAS is a 82 y.o. female with a history of migraine headaches. Returns today for follow-up.      08/24/21: Ms. Burrowes is a 82 year old female with a history of migraine headaches.  She returns today for follow-up.Reports that she is having 10-12 headaches a month. Headaches are located all over usually starts in the neck. Has been using ubrelvy and it has been helpful. Reports photophobia but no problem with noise. Currently being treated for vertigo- she is having nausea.  Tried gabapentin, nortriptyline, not interested in muscle relaxer.   Reports that she went to speak to Dr. Vivien Rota- decided they would wait to September for ablation. Reports that it is hard for her to sleep. Shoulders and neck hurt really bad.     03/01/21 (copied from Dr. Cathren Laine note): 03/01/2021: Patient Is here for folow up of headaches. She was doing extremely well on Ajovy even when we saw her 08/24/2020 but now states it isn't working anymore and stopped it last month. She had an MRI cervical spine earlier this year 02/17/2020 as below. NOW she says the Ajovy never really worked. She says it is her neck and shoulders that is causing th headaches. She feels the ablation a year ago of the neck really helped it was about 6 months headache free, and she went back back in September of 2022 and had another ablation an that was doing great until she fell around October 10th and since then the headaches have returned, not as bad but the Ajovy wasn't woring (maybe it was the ablation she thinks). A lot of neck pain, no shooting pain down the arms and no weakness, she has a problem with balance, she has been in PT, she had that fall in October. She wants to go back and see Dr. Vivien Rota. She has 6 migraine days a month and < 14 (headache+migraine total a  month)    Patient complains of symptoms per HPI as well as the following symptoms: worsening neckpain . Pertinent negatives and positives per HPI. All others negative     Reviewed images and agree, reviewed with patient   Disc levels: Multilevel desiccation and disc space loss most prominent at the C5-7 level.   C2-3: No significant disc bulge. Patent spinal canal and neural foramen.   C3-4: Disc osteophyte complex with superimposed left foraminal protrusion, uncovertebral and facet hypertrophy. Patent spinal canal and right neural foramen. Moderate left neural foraminal narrowing.   C4-5: Disc osteophyte complex with superimposed left subarticular protrusion. Left predominant uncovertebral and facet hypertrophy. Mild spinal canal, mild right and moderate left neural foraminal narrowing.   C5-6: Disc osteophyte complex abutting the ventral cord with uncovertebral and facet hypertrophy. Moderate spinal canal, moderate right and mild left neural foraminal narrowing.   C6-7: Disc osteophyte complex with superimposed bilateral foraminal protrusions. Uncovertebral and facet hypertrophy. Mild spinal canal, mild right and moderate left neural foraminal narrowing. 3 mm right perineural cyst, unchanged.   C7-T1: No significant disc bulge. Uncovertebral and facet degenerative spurring. Patent spinal canal and left neural foramen. Mild right neural foraminal narrowing. Bilateral perineural cyst measuring 4 mm.   Paraspinal tissues: Negative.   IMPRESSION: Multilevel spondylosis, progressed since prior exam.   Moderate C5-6 and mild C4-5, C6-7 spinal canal narrowing.   Moderate left C3-5, right C5-6  and left C6-7 neural foraminal narrowing.   Multilevel sub 5-mm perineural cyst as detailed above   08/24/20:Ms. Pontoriero is a 82 year old female with a history of cervicogenic headaches and migraine headaches.  She returns today for follow-up.  She reports that since the beginning of  the year she has had very few headaches.  She reports a mild headache last week but typically she can use her rice pack on her neck and her headache resolves within 30 minutes or so.  She states when she was taking Mobic she rarely had a headache.  But it caused hair loss so she is no longer taking this medication   10/16/19: Ms. Giffen is a 82 year old female with a history of cervicogenic headaches and migraine headaches.  She reports that overall she has been doing well.  She reports that since starting Ajovy she will go some weeks with no headaches at all.  Previously she was having daily headaches.  She does report on occasion she will wake up with headaches.  She states that the headaches are typically very mild.  She does report photophobia but denies nausea or vomiting.  She is unsure if she snores.  Denies excessive daytime fatigue.  Overall she feels that she has improved since her visit with Dr. Jaynee Eagles  HISTORY Hendricks Limes Tubman is a 82 y.o. female here as requested by [name] for migraines. I reviewed [name] PAC's notes: Patient was being seen by [facility] neurosurgery and spine for cervicogenic headache, neck pain, she was last seen September 13, 2019 for bilateral C3 C2-C5 medial branch block.  She has chronic cervical pain with cervicogenic headache as well as chronic lumbar pain previously treated with tramadol, she is tried various medications for headache management such as nortriptyline and amitriptyline, amitriptyline was initially effective but then stopped, when she was seen for follow-up April 20 she had excellent response to the medial branch block and 80 to 85% improvement in her neck pain, little pain in the neck itself unfortunately she continued to describe headache daily, headache symptoms are very limiting, making it difficult for her to be active as well as difficult for her to sleep, no new symptoms or falls.  I was also able to review Dr. Fay Records notes pertaining to her  headaches: Patient does have a history of migraines, she felt like they are coming from her neck, a lot of tension and tightness in her neck and a decrease in rotation to the left, heat in the morning helps, inevitably during the day her headache starts in the last until she goes to bed, some days it is more severe than others, she tried needling which caused more pain, she is tried over-the-counter medications for the neck pain as well, headaches worsening since quarantine over a year, no visual changes, she was told she could not use medications for her migraines due to "white spots" on her brain, headaches can move around and can be on the top of the head and the side of the head or the back of the head, sometimes behind the eyes, usually more on one side.  She is tried amitriptyline as well as Topamax and multiple other medications.   She has been diagnosed with migraines for about 40 years, they slowly progressed and now they are debilitating, she can feel it coming in the back or the sides of her head most of the time in the temples, can be anytime, in the temples, she tried bote guards, it is  throbbing, phono/photophobia, nausea, movement makes it worse, no vomiting, can last for up to a week off and on but one headache can last 24 hours. Daily headaches, 15 migraine days a month that are moderately severe to severe. No aura, no weakness, no numbness or tingling or weakness, heat may help a little on the neck otherwise nothing over the counter really helps she takes excedrin migraine, no known family history, no head trauma or inciting events, encephalitis as a child. Ongoing at thie frequency and severity and quality for years.    Reviewed notes, labs and imaging from outside physicians, which showed:     MRI brain 05/13/2017: IMPRESSION: No acute abnormality. Progressive atrophy and chronic white matter changes since 2012.   Amitriptyline, gabapentin, propranolol, paxil, tramadol, trazodone     REVIEW OF SYSTEMS: Out of a complete 14 system review of symptoms, the patient complains only of the following symptoms, and all other reviewed systems are negative.  See HPI  ALLERGIES: Allergies  Allergen Reactions   Celebrex [Celecoxib] Itching, Nausea Only and Other (See Comments)    Gas, hair loss also   Fluoxetine Hcl Shortness Of Breath and Itching   Ambien [Zolpidem] Other (See Comments)    Headaches    Amitriptyline Other (See Comments)    Dry mouth    Aricept [Donepezil Hcl] Other (See Comments)    Nightmares    Ciprofloxacin Other (See Comments)    Muscle weakness   Contrast Media [Iodinated Contrast Media] Itching and Other (See Comments)    MultiHance (probably not "Multilance")   Dexamethasone Diarrhea and Nausea And Vomiting   Livalo [Pitavastatin] Other (See Comments)    Myalgia    Metrizamide Itching and Other (See Comments)    AMIPAQUE (also allergic to MultiHance (probably not "Multilance")   Nitroglycerin Nausea Only and Other (See Comments)    Dizziness and sweating, also   Rosuvastatin Other (See Comments)    Body aches and cramping   Simvastatin Other (See Comments)    Muscle aches and headaches   Statins Other (See Comments)    Could NOT tolerate- "Made me feel like I had the flu"   Codeine Sulfate Itching   Sulfonamide Derivatives Nausea Only    HOME MEDICATIONS: Outpatient Medications Prior to Visit  Medication Sig Dispense Refill   aspirin 81 MG tablet Take 81 mg by mouth daily.     Cholecalciferol (VITAMIN D) 1000 UNITS capsule Take 2,000 Units by mouth daily.     conjugated estrogens (PREMARIN) vaginal cream 1 pea sized application to urethra and vaginal opening nightly x 10 days and then 3 x a week, thereafter. 42.5 g 5   cycloSPORINE (RESTASIS) 0.05 % ophthalmic emulsion Place 1 drop into both eyes 2 (two) times daily. 5.5 mL 5   escitalopram (LEXAPRO) 10 MG tablet TAKE 1 TABLET BY MOUTH EVERY DAY 90 tablet 0   EXCEDRIN MIGRAINE  250-250-65 MG tablet Take 2 tablets by mouth every 6 (six) hours as needed for migraine or headache.     fluticasone (FLONASE) 50 MCG/ACT nasal spray Place 1-2 sprays into both nostrils daily. 16 g 12   hydrOXYzine (VISTARIL) 25 MG capsule TAKE 1 CAPSULE (25 MG TOTAL) BY MOUTH TWICE A DAY AS NEEDED 180 capsule 1   MAGNESIUM PO Take 1 tablet by mouth daily.      Multiple Vitamin (MULTI-VITAMIN) tablet Take 1 tablet by mouth daily.     nitrofurantoin, macrocrystal-monohydrate, (MACROBID) 100 MG capsule Take 1 capsule (100 mg total) by  mouth 2 (two) times daily. 10 capsule 0   nystatin (MYCOSTATIN/NYSTOP) powder Apply 1 Application topically 2 (two) times daily as needed. 60 g 2   Omega-3 Fatty Acids (FISH OIL) 1000 MG CAPS Take 1 capsule (1,000 mg total) by mouth daily. (Patient taking differently: Take 2,000 mg by mouth daily.) 90 capsule 3   omeprazole (PRILOSEC) 20 MG capsule Take 1 capsule (20 mg total) by mouth 2 (two) times daily before a meal. 180 capsule 3   polyethylene glycol (MIRALAX / GLYCOLAX) packet Take 17 g by mouth daily.     REPATHA SURECLICK [id number] MG/ML SOAJ INJECT 140 MG INTO THE SKIN EVERY 14 (FOURTEEN) DAYS. 6 mL 3   rivastigmine (EXELON) 4.6 mg/24hr Place 4.6 mg onto the skin daily.     traMADol (ULTRAM) 50 MG tablet Take 1 tablet (50 mg total) by mouth 2 (two) times daily as needed (for pain). 60 tablet 1   Turmeric (CURCUPLEX-95) 500 MG CAPS Take 1 capsule by mouth daily.     Ubrogepant (UBRELVY) 100 MG TABS Take 100 mg by mouth every 2 (two) hours as needed. Maximum '200mg'$  a day. (Patient taking differently: Take 100 mg by mouth every 2 (two) hours as needed (for migraines- not to exceed 200 mg/24 hours).) 16 tablet 11   No facility-administered medications prior to visit.    PAST MEDICAL HISTORY: Past Medical History:  Diagnosis Date   Anemia    Anxiety    d/t recent family issues   Arthritis    Bone spur    Carotid stenosis, bilateral 06/06/2019   Chronic back pain     stenosis and OA   Depression    takes Paxil daily   Diverticulosis    Dizzy    occasionally   Encephalitis    at 9 months ago    Fatty liver    has had 2 Hep shots and the final one is in June 18   GERD (gastroesophageal reflux disease)    takes Omeprazole daily   Hepatitis 2012   History of bronchitis    20-25 yrs ago   History of colon polyps    precancerous   History of shingles    Hyperlipidemia    takes Simvastatin daily   IBS (irritable bowel syndrome)    more on side of constipation-takes Miralax daily   Insomnia    takes Melatonin and Ambien nightly   Internal hemorrhoids    Joint pain    Joint swelling    Lower leg edema 09/21/2021   Lung nodules    calcified    Migraine headache    migraines-thinks coming from neck.Last one1/28/18   Orthostatic hypotension 09/21/2021   Pneumonia    hx of-20 yrs ago-=-walking   Renal cyst    left   Uterine cancer ([place]) 1973    PAST SURGICAL HISTORY: Past Surgical History:  Procedure Laterality Date   APPENDECTOMY  1966   cataracts     bilateral   COLONOSCOPY  09/04/2020   EUS  02/10/2011   Procedure: UPPER ENDOSCOPIC ULTRASOUND (EUS) RADIAL;  Surgeon: Owens Loffler, MD;  Location: [facility];  Service: Endoscopy;  Laterality: N/A;  radial linear    LUMBAR FUSION  03/2016   L4-5, Dr. Ola Spurr.   RECTOCELE REPAIR  2011   ROTATOR CUFF REPAIR  2009   left   TONSILLECTOMY     UPPER GASTROINTESTINAL ENDOSCOPY     VAGINAL HYSTERECTOMY  1973    FAMILY HISTORY: Family History  Problem  Relation Age of Onset   Kidney disease Mother    Kidney failure Mother        med induced   Rheum arthritis Mother    Heart disease Father    Alcohol abuse Father    Lung cancer Father        smoker   Depression Brother    Crohn's disease Brother    Alcohol abuse Brother    Heart failure Daughter    Lung cancer Son    HIV Son    Heart disease Other        Paternal family    Malignant hyperthermia Neg Hx    Colon cancer  Neg Hx    Esophageal cancer Neg Hx    Rectal cancer Neg Hx    Stomach cancer Neg Hx    Migraines Neg Hx     SOCIAL HISTORY: Social History   Socioeconomic History   Marital status: Divorced    Spouse name: Not on file   Number of children: 5   Years of education: 9   Highest education level: 9th grade  Occupational History   Occupation: Retired      Fish farm manager: RETIRED  Tobacco Use   Smoking status: Former    Types: Cigarettes    Quit date: 02/07/1993    Years since quitting: 29.2   Smokeless tobacco: Never  Vaping Use   Vaping Use: Never used  Substance and Sexual Activity   Alcohol use: No   Drug use: No   Sexual activity: Not on file  Other Topics Concern   Not on file  Social History Narrative   Lives with her dog. She enjoys playing cards. Her daughter lives close by.                  Social Determinants of Health   Financial Resource Strain: Low Risk  (10/25/2021)   Overall Financial Resource Strain (CARDIA)    Difficulty of Paying Living Expenses: Not very hard  Food Insecurity: No Food Insecurity (02/24/2022)   Hunger Vital Sign    Worried About Running Out of Food in the Last Year: Never true    Ran Out of Food in the Last Year: Never true  Transportation Needs: No Transportation Needs (02/24/2022)   PRAPARE - Hydrologist (Medical): No    Lack of Transportation (Non-Medical): No  Physical Activity: Inactive (10/25/2021)   Exercise Vital Sign    Days of Exercise per Week: 0 days    Minutes of Exercise per Session: 0 min  Stress: No Stress Concern Present (10/25/2021)   [place]    Feeling of Stress : Not at all  Social Connections: Moderately Isolated (10/29/2021)   Social Connection and Isolation Panel [NHANES]    Frequency of Communication with Friends and Family: More than three times a week    Frequency of Social Gatherings with Friends and Family: Twice a  week    Attends Religious Services: 1 to 4 times per year    Active Member of [employer] or Organizations: No    Attends Archivist Meetings: Never    Marital Status: Divorced  Human resources officer Violence: Not At Risk (10/29/2021)   Humiliation, Afraid, Rape, and Kick questionnaire    Fear of Current or Ex-Partner: No    Emotionally Abused: No    Physically Abused: No    Sexually Abused: No      PHYSICAL EXAM  There were no vitals filed for this visit.   There is no height or weight on file to calculate BMI.  Generalized: Well developed, in no acute distress   Neurological examination  Mentation: Alert oriented to time, place, history taking. Follows all commands speech and language fluent Cranial nerve II-XII: Pupils were equal round reactive to light. Extraocular movements were full, visual field were full on confrontational test. Head turning and shoulder shrug  were normal and symmetric. Motor: The motor testing reveals 5 over 5 strength of all 4 extremities. Good symmetric motor tone is noted throughout.  Sensory: Sensory testing is intact to soft touch on all 4 extremities. No evidence of extinction is noted.  Coordination: Cerebellar testing reveals good finger-nose-finger and heel-to-shin bilaterally.  Gait and station: Gait is normal. Reflexes: Deep tendon reflexes are symmetric and normal bilaterally.   DIAGNOSTIC DATA (LABS, IMAGING, TESTING) - I reviewed patient records, labs, notes, testing and imaging myself where available.  Lab Results  Component Value Date   WBC 7.8 07/25/2021   HGB 14.7 07/25/2021   HCT 44.3 07/25/2021   MCV 87.4 07/25/2021   PLT 255 07/25/2021      Component Value Date/Time   NA 140 07/25/2021 1111   NA 138 06/11/2021 1113   K 3.6 07/25/2021 1111   CL 107 07/25/2021 1111   CO2 24 07/25/2021 1111   GLUCOSE 142 (H) 07/25/2021 1111   BUN 15 07/25/2021 1111   BUN 14 06/11/2021 1113   CREATININE 0.85 07/25/2021 1111   CREATININE  0.84 02/15/2021 1159   CALCIUM 9.6 07/25/2021 1111   PROT 7.3 07/25/2021 1111   PROT 6.8 06/11/2021 1113   ALBUMIN 4.0 07/25/2021 1111   ALBUMIN 4.2 06/11/2021 1113   AST 35 07/25/2021 1111   ALT 37 07/25/2021 1111   ALKPHOS 78 07/25/2021 1111   BILITOT 0.6 07/25/2021 1111   BILITOT 0.3 06/11/2021 1113   GFRNONAA >60 07/25/2021 1111   GFRNONAA 60 01/01/2020 1033   GFRAA 70 01/01/2020 1033   Lab Results  Component Value Date   CHOL 146 06/14/2021   HDL 42 06/14/2021   LDLCALC 72 06/14/2021   TRIG 189 (H) 06/14/2021   CHOLHDL 3.5 06/14/2021   Lab Results  Component Value Date   HGBA1C 5.6 02/15/2021   Lab Results  Component Value Date   VITAMINB12 550 02/15/2021   Lab Results  Component Value Date   TSH 2.94 02/15/2021      ASSESSMENT AND PLAN 82 y.o. year old female  has a past medical history of Anemia, Anxiety, Arthritis, Bone spur, Carotid stenosis, bilateral (06/06/2019), Chronic back pain, Depression, Diverticulosis, Dizzy, Encephalitis, Fatty liver, GERD (gastroesophageal reflux disease), Hepatitis (2012), History of bronchitis, History of colon polyps, History of shingles, Hyperlipidemia, IBS (irritable bowel syndrome), Insomnia, Internal hemorrhoids, Joint pain, Joint swelling, Lower leg edema (09/21/2021), Lung nodules, Migraine headache, Orthostatic hypotension (09/21/2021), Pneumonia, Renal cyst, and Uterine cancer ([place]) (1973). here with:  1.  Migraine headaches  2.  Cervicogenic headaches  Patient does not wish to start any new medication at this time. Plans to call Dr. Vivien Rota for sooner follow-up. Advised if her symptoms worsen or she develops new symptoms she should let us know Follow-up in 6 months or sooner if needed   [name], MSN, NP-C 04/19/2022, 3:23 PM [facility] [address] [place], [place] [address] [phone]

## 2022-04-20 ENCOUNTER — Ambulatory Visit: Payer: PPO | Admitting: Adult Health

## 2022-04-20 ENCOUNTER — Encounter: Payer: Self-pay | Admitting: Adult Health

## 2022-04-20 VITALS — BP 125/74 | HR 73 | Ht 65.5 in | Wt 186.0 lb

## 2022-04-20 DIAGNOSIS — G43009 Migraine without aura, not intractable, without status migrainosus: Secondary | ICD-10-CM | POA: Diagnosis not present

## 2022-04-20 MED ORDER — NURTEC 75 MG PO TBDP: ORAL_TABLET | ORAL | 5 refills | Status: DC

## 2022-04-27 ENCOUNTER — Telehealth: Payer: Self-pay | Admitting: *Deleted

## 2022-04-27 DIAGNOSIS — G43009 Migraine without aura, not intractable, without status migrainosus: Secondary | ICD-10-CM

## 2022-04-27 NOTE — Telephone Encounter (Signed)
Approval from initiated Nurtec PA from [facility].  Your request has been approved 20-MAR-24:20-JUN-24 Nurtec 75MG  OR TBDP Quantity:16.

## 2022-04-27 NOTE — Telephone Encounter (Signed)
Received fax from [employer] stating Nurtec has been approved from 04/27/2022 through 07/28/22. I faxed the approval letter to [facility] on [address]. Received a receipt of confirmation.

## 2022-04-28 MED ORDER — UBRELVY 100 MG PO TABS: 100.0000 mg | ORAL_TABLET | ORAL | 11 refills | Status: DC | PRN

## 2022-04-28 NOTE — Telephone Encounter (Signed)
That's fine

## 2022-04-28 NOTE — Telephone Encounter (Signed)
I called [facility] a few minutes ago and spoke with pharmacist who said he had just spoken with the patient.  The patient's cost is $317 for 8 tablets.  He does not feel optimistic that a tier reduction request would be approved by insurance.

## 2022-04-28 NOTE — Addendum Note (Signed)
Addended by: Gildardo Griffes on: 04/28/2022 02:18 PM   Modules accepted: Orders

## 2022-04-28 NOTE — Telephone Encounter (Signed)
Cheryl [name] reordered as previously prescribed and Nurtec has been canceled.

## 2022-05-02 DIAGNOSIS — N3020 Other chronic cystitis without hematuria: Secondary | ICD-10-CM | POA: Diagnosis not present

## 2022-05-02 DIAGNOSIS — N393 Stress incontinence (female) (male): Secondary | ICD-10-CM | POA: Diagnosis not present

## 2022-05-10 ENCOUNTER — Encounter: Payer: Self-pay | Admitting: Family Medicine

## 2022-05-12 ENCOUNTER — Telehealth (INDEPENDENT_AMBULATORY_CARE_PROVIDER_SITE_OTHER): Payer: PPO | Admitting: Family Medicine

## 2022-05-12 ENCOUNTER — Encounter: Payer: Self-pay | Admitting: Family Medicine

## 2022-05-12 VITALS — Ht 65.5 in | Wt 185.0 lb

## 2022-05-12 DIAGNOSIS — R052 Subacute cough: Secondary | ICD-10-CM | POA: Diagnosis not present

## 2022-05-12 DIAGNOSIS — R059 Cough, unspecified: Secondary | ICD-10-CM | POA: Insufficient documentation

## 2022-05-12 MED ORDER — OMEPRAZOLE 20 MG PO CPDR: 20.0000 mg | DELAYED_RELEASE_CAPSULE | Freq: Two times a day (BID) | ORAL | 3 refills | Status: DC

## 2022-05-12 MED ORDER — HYDROCODONE BIT-HOMATROP MBR 5-1.5 MG/5ML PO SOLN: 5.0000 mL | Freq: Every evening | ORAL | 0 refills | Status: DC | PRN

## 2022-05-12 NOTE — Assessment & Plan Note (Signed)
She has tried multiple things in the past for cough without much improvement. Antihistamine does seem to help some, recommend that she restart allegra or consider cetirizine if she can tolerated.  Nasal sprays not helpful.   Her PPI is written for BID dosing, only taking daily at this time.  She will start taking twice daily.  Hydromet cough syrup renewed to use at night, recommend not using this and taking tramadol at the same time.  If cough persists may need to consider imaging of the chest.

## 2022-05-12 NOTE — Progress Notes (Signed)
Cheryl [name] - 82 y.o. female MRN [id number]  Date of birth: 03-23-1940   This visit type was conducted due to national recommendations for restrictions regarding the COVID-19 Pandemic (e.g. social distancing).  This format is felt to be most appropriate for this patient at this time.  All issues noted in this document were discussed and addressed.  No physical exam was performed (except for noted visual exam findings with Video Visits).  I discussed the limitations of evaluation and management by telemedicine and the availability of in person appointments. The patient expressed understanding and agreed to proceed.  I connected withNAME@ on 05/12/22 at  1:10 PM EDT by a video enabled telemedicine application and verified that I am speaking with the correct person using two identifiers.  Present at visit: Cheryl Nutting, DO Laural Golden   Patient Location: Home [address] [place] [address]   Provider location:   [facility]  Chief Complaint  Patient presents with   Cough    HPI  Cheryl [name] is a 82 y.o. female who presents via audio/video conferencing for a telehealth visit today.  She has complaint of cough.  She has had this for about 2 weeks.  She reports that this is a recurrent problem for her.  She denies dyspnea, wheezing, fever, chills, chest pain, edema or other accompanying symptoms.  She does have allergies and has used allegra in the past.  Also has tried flonase and astepro but these have not really helped.  She is using omeprazole 20mg  daily.  Her daughter is asking if she may try increasing this.  She denies increased reflux symptoms.  She has tried multiple OTC cough medications without relief.  Prescribed hydromet cough syrup in the past which was helpful.    ROS:  A comprehensive ROS was completed and negative except as noted per HPI  Past Medical History:  Diagnosis Date   Anemia    Anxiety    d/t recent family issues   Arthritis    Bone spur     Carotid stenosis, bilateral 06/06/2019   Chronic back pain    stenosis and OA   Dementia    Depression    takes Paxil daily   Diverticulosis    Dizzy    occasionally   Encephalitis    at 9 months ago    Fatty liver    has had 2 Hep shots and the final one is in June 18   GERD (gastroesophageal reflux disease)    takes Omeprazole daily   Hepatitis 2012   History of bronchitis    20-25 yrs ago   History of colon polyps    precancerous   History of shingles    Hyperlipidemia    takes Simvastatin daily   IBS (irritable bowel syndrome)    more on side of constipation-takes Miralax daily   Insomnia    takes Melatonin and Ambien nightly   Internal hemorrhoids    Joint pain    Joint swelling    Lower leg edema 09/21/2021   Lung nodules    calcified    Migraine headache    migraines-thinks coming from neck.Last one1/28/18   Orthostatic hypotension 09/21/2021   Pneumonia    hx of-20 yrs ago-=-walking   Renal cyst    left   Uterine cancer 1973    Past Surgical History:  Procedure Laterality Date   APPENDECTOMY  1966   cataracts     bilateral   COLONOSCOPY  09/04/2020   EUS  02/10/2011   Procedure: UPPER ENDOSCOPIC ULTRASOUND (EUS) RADIAL;  Surgeon: Owens Loffler, MD;  Location: [facility];  Service: Endoscopy;  Laterality: N/A;  radial linear    LUMBAR FUSION  03/2016   L4-5, Dr. Ola Spurr.   [place]   ROTATOR CUFF REPAIR  2009   left   TONSILLECTOMY     UPPER GASTROINTESTINAL ENDOSCOPY     VAGINAL HYSTERECTOMY  1973    Family History  Problem Relation Age of Onset   Kidney disease Mother    Kidney failure Mother        med induced   Rheum arthritis Mother    Heart disease Father    Alcohol abuse Father    Lung cancer Father        smoker   Depression Brother    Crohn's disease Brother    Alcohol abuse Brother    Heart failure Daughter    Lung cancer Son    HIV Son    Heart disease Other        Paternal family    Malignant  hyperthermia Neg Hx    Colon cancer Neg Hx    Esophageal cancer Neg Hx    Rectal cancer Neg Hx    Stomach cancer Neg Hx    Migraines Neg Hx     Social History   Socioeconomic History   Marital status: Divorced    Spouse name: Not on file   Number of children: 5   Years of education: 9   Highest education level: 9th grade  Occupational History   Occupation: Retired      Fish farm manager: RETIRED  Tobacco Use   Smoking status: Former    Types: Cigarettes    Quit date: 02/07/1993    Years since quitting: 29.2   Smokeless tobacco: Never  Vaping Use   Vaping Use: Never used  Substance and Sexual Activity   Alcohol use: No   Drug use: No   Sexual activity: Not on file  Other Topics Concern   Not on file  Social History Narrative   Lives with her dog. She enjoys playing cards. Her daughter lives close by.                  Social Determinants of Health   Financial Resource Strain: Low Risk  (10/25/2021)   Overall Financial Resource Strain (CARDIA)    Difficulty of Paying Living Expenses: Not very hard  Food Insecurity: No Food Insecurity (02/24/2022)   Hunger Vital Sign    Worried About Running Out of Food in the Last Year: Never true    Ran Out of Food in the Last Year: Never true  Transportation Needs: No Transportation Needs (02/24/2022)   PRAPARE - Hydrologist (Medical): No    Lack of Transportation (Non-Medical): No  Physical Activity: Inactive (10/25/2021)   Exercise Vital Sign    Days of Exercise per Week: 0 days    Minutes of Exercise per Session: 0 min  Stress: No Stress Concern Present (10/25/2021)   [place]    Feeling of Stress : Not at all  Social Connections: Moderately Isolated (10/29/2021)   Social Connection and Isolation Panel [NHANES]    Frequency of Communication with Friends and Family: More than three times a week    Frequency of Social Gatherings with Friends  and Family: Twice a week    Attends Religious  Services: 1 to 4 times per year    Active Member of Clubs or Organizations: No    Attends Archivist Meetings: Never    Marital Status: Divorced  Human resources officer Violence: Not At Risk (10/29/2021)   Humiliation, Afraid, Rape, and Kick questionnaire    Fear of Current or Ex-Partner: No    Emotionally Abused: No    Physically Abused: No    Sexually Abused: No     Current Outpatient Medications:    HYDROcodone bit-homatropine (HYDROMET) 5-1.5 MG/5ML syrup, Take 5 mLs by mouth at bedtime as needed for cough., Disp: 120 mL, Rfl: 0   aspirin 81 MG tablet, Take 81 mg by mouth daily., Disp: , Rfl:    Cholecalciferol (VITAMIN D) 1000 UNITS capsule, Take 2,000 Units by mouth daily., Disp: , Rfl:    conjugated estrogens (PREMARIN) vaginal cream, 1 pea sized application to urethra and vaginal opening nightly x 10 days and then 3 x a week, thereafter., Disp: 42.5 g, Rfl: 5   cycloSPORINE (RESTASIS) 0.05 % ophthalmic emulsion, Place 1 drop into both eyes 2 (two) times daily., Disp: 5.5 mL, Rfl: 5   escitalopram (LEXAPRO) 10 MG tablet, TAKE 1 TABLET BY MOUTH EVERY DAY, Disp: 90 tablet, Rfl: 0   EXCEDRIN MIGRAINE 250-250-65 MG tablet, Take 2 tablets by mouth every 6 (six) hours as needed for migraine or headache., Disp: , Rfl:    fluticasone (FLONASE) 50 MCG/ACT nasal spray, Place 1-2 sprays into both nostrils daily., Disp: 16 g, Rfl: 12   hydrOXYzine (VISTARIL) 25 MG capsule, TAKE 1 CAPSULE (25 MG TOTAL) BY MOUTH TWICE A DAY AS NEEDED, Disp: 180 capsule, Rfl: 1   MAGNESIUM PO, Take 1 tablet by mouth daily. , Disp: , Rfl:    Multiple Vitamin (MULTI-VITAMIN) tablet, Take 1 tablet by mouth daily., Disp: , Rfl:    nitrofurantoin, macrocrystal-monohydrate, (MACROBID) 100 MG capsule, Take 1 capsule (100 mg total) by mouth 2 (two) times daily., Disp: 10 capsule, Rfl: 0   nystatin (MYCOSTATIN/NYSTOP) powder, Apply 1 Application topically 2 (two) times daily  as needed., Disp: 60 g, Rfl: 2   Omega-3 Fatty Acids (FISH OIL) 1000 MG CAPS, Take 1 capsule (1,000 mg total) by mouth daily. (Patient taking differently: Take 2,000 mg by mouth daily.), Disp: 90 capsule, Rfl: 3   omeprazole (PRILOSEC) 20 MG capsule, Take 1 capsule (20 mg total) by mouth 2 (two) times daily before a meal., Disp: 180 capsule, Rfl: 3   polyethylene glycol (MIRALAX / GLYCOLAX) packet, Take 17 g by mouth daily., Disp: , Rfl:    REPATHA SURECLICK [id number] MG/ML SOAJ, INJECT 140 MG INTO THE SKIN EVERY 14 (FOURTEEN) DAYS., Disp: 6 mL, Rfl: 3   rivastigmine (EXELON) 4.6 mg/24hr, Place 4.6 mg onto the skin daily., Disp: , Rfl:    traMADol (ULTRAM) 50 MG tablet, Take 1 tablet (50 mg total) by mouth 2 (two) times daily as needed (for pain)., Disp: 60 tablet, Rfl: 1   Turmeric (CURCUPLEX-95) 500 MG CAPS, Take 1 capsule by mouth daily., Disp: , Rfl:    Ubrogepant (UBRELVY) 100 MG TABS, Take 1 tablet (100 mg total) by mouth every 2 (two) hours as needed. Maximum 200mg  a day., Disp: 16 tablet, Rfl: 11  EXAM:  VITALS per patient if applicable: Ht 5' 5.5" (1.664 m)   Wt 185 lb (83.9 kg)   BMI 30.32 kg/m   GENERAL: alert, oriented, appears well and in no acute distress  HEENT: atraumatic, conjunttiva clear, no obvious abnormalities  on inspection of external nose and ears  NECK: normal movements of the head and neck  LUNGS: on inspection no signs of respiratory distress, breathing rate appears normal, no obvious gross SOB, gasping or wheezing  CV: no obvious cyanosis  MS: moves all visible extremities without noticeable abnormality  PSYCH/NEURO: pleasant and cooperative, no obvious depression or anxiety, speech and thought processing grossly intact  ASSESSMENT AND PLAN:  Discussed the following assessment and plan:  Cough She has tried multiple things in the past for cough without much improvement. Antihistamine does seem to help some, recommend that she restart allegra or consider  cetirizine if she can tolerated.  Nasal sprays not helpful.   Her PPI is written for BID dosing, only taking daily at this time.  She will start taking twice daily.  Hydromet cough syrup renewed to use at night, recommend not using this and taking tramadol at the same time.  If cough persists may need to consider imaging of the chest.       I discussed the assessment and treatment plan with the patient. The patient was provided an opportunity to ask questions and all were answered. The patient agreed with the plan and demonstrated an understanding of the instructions.   The patient was advised to call back or seek an in-person evaluation if the symptoms worsen or if the condition fails to improve as anticipated.    Cheryl Nutting, DO

## 2022-05-12 NOTE — Progress Notes (Signed)
Cough at night. Dry x 2 weeks.

## 2022-05-22 ENCOUNTER — Other Ambulatory Visit: Payer: Self-pay | Admitting: Family Medicine

## 2022-05-22 DIAGNOSIS — L299 Pruritus, unspecified: Secondary | ICD-10-CM

## 2022-05-23 NOTE — Telephone Encounter (Signed)
[facility]anda N[facility] assist paperwork completed to be signed by MM/NP.

## 2022-05-24 NOTE — Telephone Encounter (Signed)
I faxed the [facility] assist application for ubrelvy to [place] assist. Received a receipt of confirmation.

## 2022-05-28 ENCOUNTER — Other Ambulatory Visit: Payer: Self-pay | Admitting: Family Medicine

## 2022-05-31 ENCOUNTER — Other Ambulatory Visit: Payer: Self-pay | Admitting: Family Medicine

## 2022-05-31 DIAGNOSIS — L299 Pruritus, unspecified: Secondary | ICD-10-CM

## 2022-06-01 ENCOUNTER — Ambulatory Visit
Admission: RE | Admit: 2022-06-01 | Discharge: 2022-06-01 | Disposition: A | Discharge status: Home / Self Care | Payer: PPO | Source: Ambulatory Visit | Attending: Family Medicine | Admitting: Family Medicine

## 2022-06-01 DIAGNOSIS — Z139 Encounter for screening, unspecified: Secondary | ICD-10-CM

## 2022-06-01 DIAGNOSIS — Z1231 Encounter for screening mammogram for malignant neoplasm of breast: Secondary | ICD-10-CM | POA: Diagnosis not present

## 2022-06-03 NOTE — Progress Notes (Signed)
Please call patient. Normal mammogram.  Repeat in 1 year.  

## 2022-06-06 ENCOUNTER — Other Ambulatory Visit: Payer: Self-pay | Admitting: Cardiovascular Disease

## 2022-06-08 NOTE — Telephone Encounter (Signed)
Received fax from [facility] Assist that states patient has been approved for Ubrelvy 100 mg 16 count tablet assistance through 02/07/2023. Letter sent to medical records for scanning. Patient has been notified.

## 2022-07-05 ENCOUNTER — Ambulatory Visit (INDEPENDENT_AMBULATORY_CARE_PROVIDER_SITE_OTHER): Payer: PPO | Admitting: Family Medicine

## 2022-07-05 ENCOUNTER — Encounter: Payer: Self-pay | Admitting: Family Medicine

## 2022-07-05 VITALS — BP 117/51 | HR 80 | Ht 65.0 in | Wt 183.0 lb

## 2022-07-05 DIAGNOSIS — F419 Anxiety disorder, unspecified: Secondary | ICD-10-CM

## 2022-07-05 DIAGNOSIS — G992 Myelopathy in diseases classified elsewhere: Secondary | ICD-10-CM | POA: Diagnosis not present

## 2022-07-05 DIAGNOSIS — F32A Depression, unspecified: Secondary | ICD-10-CM | POA: Diagnosis not present

## 2022-07-05 DIAGNOSIS — M4802 Spinal stenosis, cervical region: Secondary | ICD-10-CM

## 2022-07-05 DIAGNOSIS — Z79899 Other long term (current) drug therapy: Secondary | ICD-10-CM

## 2022-07-05 DIAGNOSIS — F03B4 Unspecified dementia, moderate, with anxiety: Secondary | ICD-10-CM

## 2022-07-05 DIAGNOSIS — J0100 Acute maxillary sinusitis, unspecified: Secondary | ICD-10-CM | POA: Diagnosis not present

## 2022-07-05 MED ORDER — TRAMADOL HCL 50 MG PO TABS
50.0000 mg | ORAL_TABLET | Freq: Two times a day (BID) | ORAL | 1 refills | Status: DC | PRN
Start: 2022-07-05 — End: 2022-11-11

## 2022-07-05 MED ORDER — AZITHROMYCIN 250 MG PO TABS: ORAL_TABLET | ORAL | 0 refills | Status: AC

## 2022-07-05 MED ORDER — ESCITALOPRAM OXALATE 10 MG PO TABS: 10.0000 mg | ORAL_TABLET | Freq: Every day | ORAL | 1 refills | Status: DC

## 2022-07-05 NOTE — Assessment & Plan Note (Signed)
Very happy with the 10 mg Lexapro.  She says it keeps her mood positive and happy.  Continue current regimen and follow-up in 6 months.

## 2022-07-05 NOTE — Patient Instructions (Signed)
Ok to start the azithromycin if you are not feeling better in the next couple days.  Or you can start sooner if you actually feel like you are getting worse.

## 2022-07-05 NOTE — Assessment & Plan Note (Signed)
Has an upcoming appointment with Dr. Aram Beecham over at [facility].  She wants to possibly consider switching to the oral form of Exelon because of cost.  It has been quite expensive with her current prescription.  We also discussed checking into patient assistance for several of her medications which are brand.

## 2022-07-05 NOTE — Assessment & Plan Note (Signed)
She would like a refill on her tramadol today.  New prescription sent to pharmacy with 1 refill.

## 2022-07-05 NOTE — Progress Notes (Addendum)
Established Patient Office Visit  Subjective   Patient ID: Cheryl [name], female    DOB: Jan 18, 1941  Age: 82 y.o. MRN: [id number]  No chief complaint on file.   HPI  She had a video visit back in April for a persistent cough she has had for 2 weeks, with Dr. Ashley Royalty.  They encouraged her to increase her PPI to twice a day and do a trial of an antihistamine to see if this would help.  Also refilled Hydromet cough syrup for bedtime.  She says she is maybe 30% better.  She is using her Allegra.  Follow-up anxiety/depression-currently on Lexapro 10 mg daily tolerating well.  Follow up dementia-had nightmares on Aricept.  Currently on Exelon patch.  She follows at [facility] for her migraine headaches.  Last seen in arched they decided to try Nurtec for abortive therapy and retry the Bernita Raisin which she had at home.  She has some significant local spine degeneration which does likely contribute.  Also is having some sinus symptoms.  That started over the last several weeks with drainage congestion cough that is worse at night.  No fever.  Recently started a supplement that is a probiotic and cranberry concentrate and has really done well with it.  She has been able to keep the frequent UTIs at bay thus far.   ROS    Objective:     BP (!) 117/51   Pulse 80   Ht 5\' 5"  (1.651 m)   Wt 183 lb (83 kg)   SpO2 95%   BMI 30.45 kg/m    Physical Exam Vitals and nursing note reviewed.  Constitutional:      Appearance: She is well-developed.  HENT:     Head: Normocephalic and atraumatic.  Cardiovascular:     Rate and Rhythm: Normal rate and regular rhythm.     Heart sounds: Normal heart sounds.  Pulmonary:     Effort: Pulmonary effort is normal.     Breath sounds: Normal breath sounds.  Skin:    General: Skin is warm and dry.  Neurological:     Mental Status: She is alert and oriented to person, place, and time.  Psychiatric:        Behavior: Behavior normal.       No results found for any visits on 07/05/22.    The ASCVD Risk score (Arnett DK, et al., 2019) failed to calculate for the following reasons:   The 2019 ASCVD risk score is only valid for ages 9 to 33    Assessment & Plan:   Problem List Items Addressed This Visit       Nervous and Auditory   Stenosis of cervical spine with myelopathy (HCC)    She would like a refill on her tramadol today.  New prescription sent to pharmacy with 1 refill.      Relevant Medications   traMADol (ULTRAM) 50 MG tablet   Other Relevant Orders   COMPLETE METABOLIC PANEL WITH GFR   Lipid Panel w/reflex Direct LDL   CBC with Differential/Platelet   Dementia (HCC)    Has an upcoming appointment with Dr. Aram Beecham over at [facility].  She wants to possibly consider switching to the oral form of Exelon because of cost.  It has been quite expensive with her current prescription.  We also discussed checking into patient assistance for several of her medications which are brand.      Relevant Medications   escitalopram (LEXAPRO) 10 MG tablet  Other Relevant Orders   COMPLETE METABOLIC PANEL WITH GFR   Lipid Panel w/reflex Direct LDL   CBC with Differential/Platelet     Other   Anxiety and depression - Primary    Very happy with the 10 mg Lexapro.  She says it keeps her mood positive and happy.  Continue current regimen and follow-up in 6 months.      Relevant Medications   escitalopram (LEXAPRO) 10 MG tablet   Other Relevant Orders   COMPLETE METABOLIC PANEL WITH GFR   Lipid Panel w/reflex Direct LDL   CBC with Differential/Platelet   Other Visit Diagnoses     Medication management       Relevant Orders   COMPLETE METABOLIC PANEL WITH GFR   Lipid Panel w/reflex Direct LDL   CBC with Differential/Platelet   Acute non-recurrent maxillary sinusitis       Relevant Medications   azithromycin (ZITHROMAX) 250 MG tablet       Sinusitis-she is feeling some better so we did discuss  giving it a couple more days to see if she continues to improve but if not or if she suddenly feels worse then okay to fill prescription for antibiotic.  Return in about 4 months (around 11/05/2022) for mood.    Nani Gasser, MD

## 2022-07-06 ENCOUNTER — Other Ambulatory Visit: Payer: Self-pay | Admitting: Family Medicine

## 2022-07-26 DIAGNOSIS — F419 Anxiety disorder, unspecified: Secondary | ICD-10-CM | POA: Diagnosis not present

## 2022-07-26 DIAGNOSIS — F03B4 Unspecified dementia, moderate, with anxiety: Secondary | ICD-10-CM | POA: Diagnosis not present

## 2022-07-26 DIAGNOSIS — M4802 Spinal stenosis, cervical region: Secondary | ICD-10-CM | POA: Diagnosis not present

## 2022-07-26 DIAGNOSIS — F32A Depression, unspecified: Secondary | ICD-10-CM | POA: Diagnosis not present

## 2022-07-26 DIAGNOSIS — G992 Myelopathy in diseases classified elsewhere: Secondary | ICD-10-CM | POA: Diagnosis not present

## 2022-07-26 DIAGNOSIS — Z79899 Other long term (current) drug therapy: Secondary | ICD-10-CM | POA: Diagnosis not present

## 2022-07-27 LAB — COMPLETE METABOLIC PANEL WITH GFR
AG Ratio: 1.8 (calc) (ref 1.0–2.5)
ALT: 50 U/L — ABNORMAL HIGH (ref 6–29)
AST: 73 U/L — ABNORMAL HIGH (ref 10–35)
Albumin: 4.2 g/dL (ref 3.6–5.1)
Alkaline phosphatase (APISO): 71 U/L (ref 37–153)
BUN: 15 mg/dL (ref 7–25)
CO2: 28 mmol/L (ref 20–32)
Calcium: 9.8 mg/dL (ref 8.6–10.4)
Chloride: 105 mmol/L (ref 98–110)
Creat: 0.94 mg/dL (ref 0.60–0.95)
Globulin: 2.3 g/dL (calc) (ref 1.9–3.7)
Glucose, Bld: 112 mg/dL — ABNORMAL HIGH (ref 65–99)
Potassium: 4.5 mmol/L (ref 3.5–5.3)
Sodium: 140 mmol/L (ref 135–146)
Total Bilirubin: 0.6 mg/dL (ref 0.2–1.2)
Total Protein: 6.5 g/dL (ref 6.1–8.1)
eGFR: 61 mL/min/{1.73_m2} (ref >=60)

## 2022-07-27 LAB — LIPID PANEL W/REFLEX DIRECT LDL
Cholesterol: 155 mg/dL (ref <200)
HDL: 47 mg/dL — ABNORMAL LOW (ref >=50)
LDL Cholesterol (Calc): 82 mg/dL (calc)
Non-HDL Cholesterol (Calc): 108 mg/dL (calc) (ref <130)
Total CHOL/HDL Ratio: 3.3 (calc) (ref <5.0)
Triglycerides: 164 mg/dL — ABNORMAL HIGH (ref <150)

## 2022-07-27 LAB — CBC WITH DIFFERENTIAL/PLATELET
Absolute Monocytes: 369 cells/uL (ref 200–950)
Basophils Absolute: 31 cells/uL (ref 0–200)
Basophils Relative: 0.6 %
Eosinophils Absolute: 380 cells/uL (ref 15–500)
Eosinophils Relative: 7.3 %
HCT: 43.4 % (ref 35.0–45.0)
Hemoglobin: 14.2 g/dL (ref 11.7–15.5)
Lymphs Abs: 1893 cells/uL (ref 850–3900)
MCH: 28.1 pg (ref 27.0–33.0)
MCHC: 32.7 g/dL (ref 32.0–36.0)
MCV: 85.8 fL (ref 80.0–100.0)
MPV: 9.7 fL (ref 7.5–12.5)
Monocytes Relative: 7.1 %
Neutro Abs: 2527 cells/uL (ref 1500–7800)
Neutrophils Relative %: 48.6 %
Platelets: 249 10*3/uL (ref 140–400)
RBC: 5.06 10*6/uL (ref 3.80–5.10)
RDW: 13.3 % (ref 11.0–15.0)
Total Lymphocyte: 36.4 %
WBC: 5.2 10*3/uL (ref 3.8–10.8)

## 2022-07-28 ENCOUNTER — Encounter: Payer: Self-pay | Admitting: Family Medicine

## 2022-07-29 NOTE — Progress Notes (Signed)
Liver enzymes are elevated. Discussed plan with Dr. Tamera Punt on Monday.

## 2022-08-01 ENCOUNTER — Ambulatory Visit (INDEPENDENT_AMBULATORY_CARE_PROVIDER_SITE_OTHER): Payer: PPO | Admitting: Family Medicine

## 2022-08-01 ENCOUNTER — Encounter: Payer: Self-pay | Admitting: Family Medicine

## 2022-08-01 VITALS — BP 114/53 | HR 68 | Resp 18 | Ht 65.0 in | Wt 184.2 lb

## 2022-08-01 DIAGNOSIS — D509 Iron deficiency anemia, unspecified: Secondary | ICD-10-CM | POA: Diagnosis not present

## 2022-08-01 DIAGNOSIS — R7401 Elevation of levels of liver transaminase levels: Secondary | ICD-10-CM | POA: Insufficient documentation

## 2022-08-01 DIAGNOSIS — E559 Vitamin D deficiency, unspecified: Secondary | ICD-10-CM | POA: Diagnosis not present

## 2022-08-01 DIAGNOSIS — J0100 Acute maxillary sinusitis, unspecified: Secondary | ICD-10-CM | POA: Diagnosis not present

## 2022-08-01 DIAGNOSIS — M546 Pain in thoracic spine: Secondary | ICD-10-CM | POA: Insufficient documentation

## 2022-08-01 MED ORDER — DOXYCYCLINE HYCLATE 100 MG PO TABS: 100.0000 mg | ORAL_TABLET | Freq: Two times a day (BID) | ORAL | 0 refills | Status: AC

## 2022-08-01 NOTE — Progress Notes (Signed)
Acute Office Visit  Subjective:     Patient ID: Cheryl [name], female    DOB: 1940-04-15, 82 y.o.   MRN: [id number]  Chief Complaint  Patient presents with   Sinus Problem   Fatigue    Sinus Problem Pertinent negatives include no chills, coughing, headaches or shortness of breath.   Patient is in today for acute visit. Pt was seen by PCP about 4 weeks ago and diagnosed with acute maxillary sinusitis and given azithromycin. Daughter aids in history telling today and says mom has not gotten better and has been feeling worse. She is taking the [employer] brand for headaches. She has been taking this for three days.   Pt had blood work done about 6 days ago and had slightly elevated AST and ALT.  Review of Systems  Constitutional:  Negative for chills and fever.  Respiratory:  Negative for cough and shortness of breath.   Cardiovascular:  Negative for chest pain.  Neurological:  Negative for headaches.        Objective:    BP (!) 114/53 (BP Location: Left Arm, Patient Position: Sitting, Cuff Size: Large)   Pulse 68   Resp 18   Ht 5\' 5"  (1.651 m)   Wt 184 lb 4 oz (83.6 kg)   SpO2 95%   BMI 30.66 kg/m    Physical Exam Vitals and nursing note reviewed.  Constitutional:      General: She is not in acute distress.    Appearance: Normal appearance.  HENT:     Head: Normocephalic and atraumatic.     Right Ear: External ear normal.     Left Ear: External ear normal.     Nose: Nose normal.  Eyes:     Conjunctiva/sclera: Conjunctivae normal.  Cardiovascular:     Rate and Rhythm: Normal rate and regular rhythm.  Pulmonary:     Effort: Pulmonary effort is normal.     Breath sounds: Normal breath sounds.  Neurological:     General: No focal deficit present.     Mental Status: She is alert and oriented to person, place, and time.  Psychiatric:        Mood and Affect: Mood normal.        Behavior: Behavior normal.        Thought Content: Thought content normal.         Judgment: Judgment normal.     No results found for any visits on 08/01/22.      Assessment & Plan:   Problem List Items Addressed This Visit       Respiratory   Acute non-recurrent maxillary sinusitis - Primary    Currently seen by PCP and given azithromycin she is still not better.  Will go ahead and do doxycycline.  Also recommended over-the-counter Flonase.  Patient does have inflamed tears on physical exam. So discussed increasing immune boosters and getting plenty of rest.  I believe the most likely cause of her fatigue is from this illness.  Did counsel patient on side effects of antibiotic as well as photosensitivity side effect of doxycycline.      Relevant Medications   doxycycline (VIBRA-TABS) 100 MG tablet     Other   Iron deficiency anemia    Patient has a history of iron deficiency anemia we will go ahead and order iron levels today to see if she needs a transfusion.  She is noted fatigue and I am wondering if this is the cause.  Relevant Orders   Fe+TIBC+Fer   Vitamin D deficiency    Has a history of vitamin D deficiency and we will repeat vitamin D level today.  I wonder if this is one of the causes of her fatigue.      Relevant Orders   Vitamin D (25 hydroxy)   Transaminitis    Will go ahead and repeat AST and ALT levels today.  They are slightly elevated which could likely be due to medication use of too much Tylenol or being that she was on azithromycin.      Relevant Orders   BASIC METABOLIC PANEL WITH GFR    Meds ordered this encounter  Medications   doxycycline (VIBRA-TABS) 100 MG tablet    Sig: Take 1 tablet (100 mg total) by mouth 2 (two) times daily for 7 days.    Dispense:  14 tablet    Refill:  0    Return if symptoms worsen or fail to improve, for with pcp.  Charlton Amor, DO

## 2022-08-01 NOTE — Assessment & Plan Note (Signed)
Currently seen by PCP and given azithromycin she is still not better.  Will go ahead and do doxycycline.  Also recommended over-the-counter Flonase.  Patient does have inflamed tears on physical exam. So discussed increasing immune boosters and getting plenty of rest.  I believe the most likely cause of her fatigue is from this illness.  Did counsel patient on side effects of antibiotic as well as photosensitivity side effect of doxycycline.

## 2022-08-01 NOTE — Assessment & Plan Note (Signed)
Has a history of vitamin D deficiency and we will repeat vitamin D level today.  I wonder if this is one of the causes of her fatigue.

## 2022-08-01 NOTE — Assessment & Plan Note (Signed)
Will go ahead and repeat AST and ALT levels today.  They are slightly elevated which could likely be due to medication use of too much Tylenol or being that she was on azithromycin.

## 2022-08-01 NOTE — Assessment & Plan Note (Signed)
Patient has a history of iron deficiency anemia we will go ahead and order iron levels today to see if she needs a transfusion.  She is noted fatigue and I am wondering if this is the cause.

## 2022-08-02 LAB — BASIC METABOLIC PANEL WITH GFR
BUN/Creatinine Ratio: 16 (calc) (ref 6–22)
CO2: 26 mmol/L (ref 20–32)
Calcium: 9.8 mg/dL (ref 8.6–10.4)
Chloride: 102 mmol/L (ref 98–110)
Creat: 1.08 mg/dL — ABNORMAL HIGH (ref 0.60–0.95)
Glucose, Bld: 95 mg/dL (ref 65–99)
Potassium: 5.1 mmol/L (ref 3.5–5.3)
Sodium: 138 mmol/L (ref 135–146)
eGFR: 52 mL/min/{1.73_m2} — ABNORMAL LOW (ref >=60)

## 2022-08-02 LAB — IRON,TIBC AND FERRITIN PANEL
%SAT: 29 % (calc) (ref 16–45)
Ferritin: 85 ng/mL (ref 16–288)
Iron: 88 ug/dL (ref 45–160)
TIBC: 302 mcg/dL (calc) (ref 250–450)

## 2022-08-02 LAB — VITAMIN D 25 HYDROXY (VIT D DEFICIENCY, FRACTURES): Vit D, 25-Hydroxy: 53 ng/mL (ref 30–100)

## 2022-08-05 LAB — BASIC METABOLIC PANEL WITH GFR: BUN: 17 mg/dL (ref 7–25)

## 2022-08-21 ENCOUNTER — Other Ambulatory Visit: Payer: Self-pay | Admitting: Family Medicine

## 2022-08-21 DIAGNOSIS — L299 Pruritus, unspecified: Secondary | ICD-10-CM

## 2022-09-02 DIAGNOSIS — F01A3 Vascular dementia, mild, with mood disturbance: Secondary | ICD-10-CM | POA: Diagnosis not present

## 2022-09-08 ENCOUNTER — Ambulatory Visit: Payer: PPO | Admitting: Family Medicine

## 2022-09-15 ENCOUNTER — Telehealth (INDEPENDENT_AMBULATORY_CARE_PROVIDER_SITE_OTHER): Payer: PPO | Admitting: Family Medicine

## 2022-09-15 ENCOUNTER — Encounter: Payer: Self-pay | Admitting: Family Medicine

## 2022-09-15 DIAGNOSIS — R0982 Postnasal drip: Secondary | ICD-10-CM

## 2022-09-15 DIAGNOSIS — R0981 Nasal congestion: Secondary | ICD-10-CM

## 2022-09-15 DIAGNOSIS — U071 COVID-19: Secondary | ICD-10-CM

## 2022-09-15 DIAGNOSIS — R6883 Chills (without fever): Secondary | ICD-10-CM | POA: Diagnosis not present

## 2022-09-15 DIAGNOSIS — R748 Abnormal levels of other serum enzymes: Secondary | ICD-10-CM

## 2022-09-15 DIAGNOSIS — R232 Flushing: Secondary | ICD-10-CM

## 2022-09-15 DIAGNOSIS — R110 Nausea: Secondary | ICD-10-CM | POA: Diagnosis not present

## 2022-09-15 MED ORDER — NIRMATRELVIR/RITONAVIR (PAXLOVID) TABLET (RENAL DOSING): 2.0000 | ORAL_TABLET | Freq: Two times a day (BID) | ORAL | 0 refills | Status: AC

## 2022-09-15 MED ORDER — AMOXICILLIN-POT CLAVULANATE 875-125 MG PO TABS: 1.0000 | ORAL_TABLET | Freq: Two times a day (BID) | ORAL | 0 refills | Status: DC

## 2022-09-15 MED ORDER — ONDANSETRON HCL 4 MG PO TABS: 4.0000 mg | ORAL_TABLET | Freq: Three times a day (TID) | ORAL | 0 refills | Status: DC | PRN

## 2022-09-15 MED ORDER — OMEPRAZOLE 20 MG PO CPDR: 20.0000 mg | DELAYED_RELEASE_CAPSULE | Freq: Every day | ORAL | 1 refills | Status: DC

## 2022-09-15 NOTE — Telephone Encounter (Signed)
Med sent.

## 2022-09-15 NOTE — Telephone Encounter (Signed)
Agree with above. Thank you

## 2022-09-15 NOTE — Progress Notes (Signed)
Spoke w/pt's daughter. She stated that she would like to speak to Dr. Linford Arnold about switching to Dr. Alverda Skeans.   She also has had a lot of nausea past few days. She has tried ginger ale,crackers and tums.she is also experiencing hot and cold flashes and would like to have a TSH,TPO checked.   Pt states that she has had a hard time breathing since she has had the nausea (4-5 days). No body aches, fevers.

## 2022-09-15 NOTE — Addendum Note (Signed)
Addended by: Nani Gasser D on: 09/15/2022 04:00 PM   Modules accepted: Orders

## 2022-09-15 NOTE — Patient Instructions (Signed)
Restart your Allegra and nasal spray.

## 2022-09-15 NOTE — Progress Notes (Addendum)
Virtual Visit via Telephone Note  I connected with Cheryl [name] on 09/15/22 at  2:00 PM EDT by telephone and verified that I am speaking with the correct person using two identifiers.   I discussed the limitations, risks, security and privacy concerns of performing an evaluation and management service by telephone and the availability of in person appointments. I also discussed with the patient that there may be a patient responsible charge related to this service. The patient expressed understanding and agreed to proceed.  Patient location: at home, daughter [name] present Provider loccation: In office   Subjective:    CC:   Chief Complaint  Patient presents with   Follow-up         HPI:    Spoke w/pt's daughter. She stated that she would like to speak to Dr. Linford Arnold about switching to Dr. Alverda Skeans.    She felt much better after the doxy.  She says she then decided to clean her house which she had not done in months and was around a lot of dust and felt like her symptoms started to come back, a little over a week ago.  Then started with drainage, cough. Loss of taste. She also has had a lot of nausea past few days. She has tried ginger ale,crackers and tums.she is also experiencing hot and cold flashes and would like to have a TSH,TPO checked. Couple of days of diarrhea.    Pt states that she has had a hard time breathing since she has had the nausea (4-5 days). No body aches, fevers.      Feels more SOB in throat area. No wheezing.  Reports that she feels short of breath even at rest.  Feeling hot and cold for several months.  Past medical history, Surgical history, Family history not pertinant except as noted below, Social history, Allergies, and medications have been entered into the medical record, reviewed, and corrections made.   Review of Systems: No fevers, chills, night sweats, weight loss, chest pain, or shortness of breath.   Objective:    General: Speaking  clearly in complete sentences without any shortness of breath.  Alert and oriented x3.  Normal judgment. No apparent acute distress.    Impression and Recommendations:    Problem List Items Addressed This Visit   None Visit Diagnoses     Elevated liver enzymes    -  Primary   Relevant Orders   CMP14+EGFR   Thyroid Panel With TSH   Chills       Relevant Orders   CMP14+EGFR   Thyroid Panel With TSH   Hot flashes       Relevant Orders   CMP14+EGFR   Thyroid Panel With TSH   Post-nasal drip       Nasal congestion       Nausea       COVID-19       Relevant Medications   nirmatrelvir/ritonavir, renal dosing, (PAXLOVID) 10 x 150 MG & 10 x 100MG  TABS      Dealing with post nasal drip and congestion again after cleaning her house.  Recommend restart Allegra and her nasal steroid spray.  Also recommend doing a home COVID test as we have seen in upsurge and COVID and one of the things that she complained about was losing her taste for things.  Meat though it also might just be decreased appetite.  If positive for COVID we will treat with Paxlovid.  If negative then okay to start  the antibiotic.  I did go ahead and send that over to the pharmacy.  She also reports intermittent chills and sweats that are been going on for quite some time even before she became ill.  And would like to have a thyroid panel done.  Will go ahead and get that ordered.  Certainly had elevated liver enzymes due to recheck those as well.  Nausea-also reports significant nausea since being ill.  Could be related to postnasal drip.  Also had a couple days with loose stools.  Will send over prescription for Zofran to use and see if we can get better through the weekend.  If symptoms do not improve or worsen then further workup for nausea would be warranted.  As of breath-she will plan to check a pulse ox at home and see if it is normal.  If she felt new or worsening symptoms or wheezing then please let us know we can  always get a chest x-ray if needed.  But she was able to speak and answer questions without significant difficulty.  Sent my chart - Pos for COVID 19. Will treat with paxlovid and hold on the antibiotic for now.   Meds ordered this encounter  Medications   ondansetron (ZOFRAN) 4 MG tablet    Sig: Take 1 tablet (4 mg total) by mouth every 8 (eight) hours as needed for nausea or vomiting.    Dispense:  12 tablet    Refill:  0   amoxicillin-clavulanate (AUGMENTIN) 875-125 MG tablet    Sig: Take 1 tablet by mouth 2 (two) times daily.    Dispense:  14 tablet    Refill:  0   omeprazole (PRILOSEC) 20 MG capsule    Sig: Take 1 capsule (20 mg total) by mouth daily before breakfast.    Dispense:  90 capsule    Refill:  1    Please d/c old rx for BID dosing.   nirmatrelvir/ritonavir, renal dosing, (PAXLOVID) 10 x 150 MG & 10 x 100MG  TABS    Sig: Take 2 tablets by mouth 2 (two) times daily for 5 days. (Take nirmatrelvir 150 mg one tablet twice daily for 5 days and ritonavir 100 mg one tablet twice daily for 5 days) Patient GFR is 52    Dispense:  20 tablet    Refill:  0    Meds ordered this encounter  Medications   ondansetron (ZOFRAN) 4 MG tablet    Sig: Take 1 tablet (4 mg total) by mouth every 8 (eight) hours as needed for nausea or vomiting.    Dispense:  12 tablet    Refill:  0   amoxicillin-clavulanate (AUGMENTIN) 875-125 MG tablet    Sig: Take 1 tablet by mouth 2 (two) times daily.    Dispense:  14 tablet    Refill:  0   omeprazole (PRILOSEC) 20 MG capsule    Sig: Take 1 capsule (20 mg total) by mouth daily before breakfast.    Dispense:  90 capsule    Refill:  1    Please d/c old rx for BID dosing.   nirmatrelvir/ritonavir, renal dosing, (PAXLOVID) 10 x 150 MG & 10 x 100MG  TABS    Sig: Take 2 tablets by mouth 2 (two) times daily for 5 days. (Take nirmatrelvir 150 mg one tablet twice daily for 5 days and ritonavir 100 mg one tablet twice daily for 5 days) Patient GFR is 52     Dispense:  20 tablet    Refill:  0  I discussed the assessment and treatment plan with the patient. The patient was provided an opportunity to ask questions and all were answered. The patient agreed with the plan and demonstrated an understanding of the instructions.   The patient was advised to call back or seek an in-person evaluation if the symptoms worsen or if the condition fails to improve as anticipated.  I spent 40 minutes on the day of the encounter to include pre-visit record review, face-to-face time with the patient and post visit ordering of test.   Nani Gasser, MD

## 2022-09-26 ENCOUNTER — Telehealth: Payer: Self-pay | Admitting: Family Medicine

## 2022-09-26 NOTE — Telephone Encounter (Signed)
Yes, ok to change to 10/2 appt to Dr. Tamera Punt

## 2022-09-26 NOTE — Telephone Encounter (Signed)
Patient's daughter called in wanting to switch providers to [facility] due to availability.

## 2022-10-13 DIAGNOSIS — R6883 Chills (without fever): Secondary | ICD-10-CM | POA: Diagnosis not present

## 2022-10-13 DIAGNOSIS — R232 Flushing: Secondary | ICD-10-CM | POA: Diagnosis not present

## 2022-10-13 DIAGNOSIS — R748 Abnormal levels of other serum enzymes: Secondary | ICD-10-CM | POA: Diagnosis not present

## 2022-10-17 ENCOUNTER — Encounter: Payer: Self-pay | Admitting: Family Medicine

## 2022-10-17 DIAGNOSIS — R7989 Other specified abnormal findings of blood chemistry: Secondary | ICD-10-CM

## 2022-10-17 DIAGNOSIS — R748 Abnormal levels of other serum enzymes: Secondary | ICD-10-CM

## 2022-10-17 NOTE — Progress Notes (Signed)
Hi Ashle & Kim, kidney function was stable at 1.08.  Is exactly what it was 2 months ago which is up just slightly from your baseline of around 0.85.  You do look well-hydrated.  The liver enzymes were elevated similar to what they were previously.  No major change or shift there.  I would really like to get an ultrasound to take a look at the liver and the kidneys if that something you feel comfortable with please let me know.  I am also happy to talk with you about the results.  We can always set up a virtual visit to go over if you would like.

## 2022-10-20 ENCOUNTER — Ambulatory Visit (HOSPITAL_BASED_OUTPATIENT_CLINIC_OR_DEPARTMENT_OTHER): Payer: PPO | Admitting: Cardiovascular Disease

## 2022-10-20 ENCOUNTER — Encounter (HOSPITAL_BASED_OUTPATIENT_CLINIC_OR_DEPARTMENT_OTHER): Payer: Self-pay | Admitting: Cardiovascular Disease

## 2022-10-20 VITALS — BP 112/62 | HR 65 | Ht 65.0 in | Wt 178.5 lb

## 2022-10-20 DIAGNOSIS — I6523 Occlusion and stenosis of bilateral carotid arteries: Secondary | ICD-10-CM | POA: Diagnosis not present

## 2022-10-20 DIAGNOSIS — K760 Fatty (change of) liver, not elsewhere classified: Secondary | ICD-10-CM

## 2022-10-20 DIAGNOSIS — I951 Orthostatic hypotension: Secondary | ICD-10-CM

## 2022-10-20 DIAGNOSIS — K219 Gastro-esophageal reflux disease without esophagitis: Secondary | ICD-10-CM

## 2022-10-20 NOTE — Patient Instructions (Signed)
Medication Instructions:  Your physician recommends that you continue on your current medications as directed. Please refer to the Current Medication list given to you today.   *If you need a refill on your cardiac medications before your next appointment, please call your pharmacy*  Lab Work: NONE  Testing/Procedures: Your physician has requested that you have a carotid duplex. This test is an ultrasound of the carotid arteries in your neck. It looks at blood flow through these arteries that supply the brain with blood. Allow one hour for this exam. There are no restrictions or special instructions. TO BE DONE IN MAY   Follow-Up: At [facility], you and your health needs are our priority.  As part of our continuing mission to provide you with exceptional heart care, we have created designated Provider Care Teams.  These Care Teams include your primary Cardiologist (physician) and Advanced Practice Providers (APPs -  Physician Assistants and Nurse Practitioners) who all work together to provide you with the care you need, when you need it.  We recommend signing up for the patient portal called "MyChart".  Sign up information is provided on this After Visit Summary.  MyChart is used to connect with patients for Virtual Visits (Telemedicine).  Patients are able to view lab/test results, encounter notes, upcoming appointments, etc.  Non-urgent messages can be sent to your provider as well.   To learn more about what you can do with MyChart, go to [email or web address].    Your next appointment:   IN MAY WITH Chilton Si, MD or Gillian Shields, NP    PARTICIPATE IN AT LEAST 2 CLASSES A WEEK AT [employer]

## 2022-10-20 NOTE — Progress Notes (Deleted)
  Cardiology Office Note:  .   Date:  10/20/2022  ID:  Cheryl [name], DOB [birth year], MRN [id number] PCP: Charlton Amor, DO  [facility] Providers Cardiologist:  None { Click to update primary MD,subspecialty MD or APP then REFRESH:1}   History of Present Illness: .   Cheryl [name] is a 82 y.o. female with hyperlipidemia, fatty liver disease, and mild carotid stenosis who presents for follow up.  She was first seen 04/2015 with a report of shortness of breath.  She had an echo 05/28/15 that revealed LVEF 55-60% with grade 1 diastolic dysfunction. She also had a Lexiscan Myoview 05/07/15 that revealed LVEF 60% with a small, fixed septal/anteroseptal defect that was felt to be artifact. There is no evidence of ischemia.  She has struggled with orthostatic hypotension and was instructed to wear compression stockings increase her salt intake. She underwent L4/L5 decompression surgery on 03/2016.  She then struggled to breathe due to her back brace.     She reported exertional dyspnea.  She was referred for a Lexiscan Myoview 06/2019 at that time revealed LVEF 69% with no ischemia.  Carotid Dopplers still revealed mild stenosis bilaterally.  She noted some heart racing.  She wore an ambulatory monitor that revealed some PACs and 4 beats of SVT.  At her visit 09/2021 she continued to note some palpitations, especially when lying down at night.  She was also struggling with vertigo intermittently.    ROS: ***  Studies Reviewed: .        *** Risk Assessment/Calculations:   {Does this patient have ATRIAL FIBRILLATION?[phone]} No BP recorded.  {Refresh Note OR Click here to enter BP  :1}***       Physical Exam:   VS:  There were no vitals taken for this visit.   Wt Readings from Last 3 Encounters:  08/01/22 184 lb 4 oz (83.6 kg)  07/05/22 183 lb (83 kg)  05/12/22 185 lb (83.9 kg)    GEN: Well nourished, well developed in no acute distress NECK: No JVD; No carotid bruits CARDIAC: ***RRR, no  murmurs, rubs, gallops RESPIRATORY:  Clear to auscultation without rales, wheezing or rhonchi  ABDOMEN: Soft, non-tender, non-distended EXTREMITIES:  No edema; No deformity   ASSESSMENT AND PLAN: .   ***    {Are you ordering a CV Procedure (e.g. stress test, cath, DCCV, TEE, etc)?   Press F2        :[id number]}  Dispo: ***  Signed, Chilton Si, MD

## 2022-10-20 NOTE — Progress Notes (Addendum)
Cardiology Office Note:  .    Date:  10/20/2022  ID:  Kelsye, Hulen Sep 14, 1940, MRN [id number] PCP: Charlton Amor, DO   [facility] Providers Cardiologist:  None     History of Present Illness: .    Cheryl [name] is a 82 y.o. female with hyperlipidemia, fatty liver disease, and mild carotid stenosis who presents for follow up.  She was first seen 04/2015 with a report of shortness of breath.  She had an echo 05/28/15 that revealed LVEF 55-60% with grade 1 diastolic dysfunction. She also had a Lexiscan Myoview 05/07/15 that revealed LVEF 60% with a small, fixed septal/anteroseptal defect that was felt to be artifact. There is no evidence of ischemia.  She has struggled with orthostatic hypotension and was instructed to wear compression stockings increase her salt intake. She underwent L4/L5 decompression surgery on 03/2016.  She then struggled to breathe due to her back brace.     She reported exertional dyspnea.  She was referred for a Lexiscan Myoview 06/2019 at that time revealed LVEF 69% with no ischemia.  Carotid Dopplers still revealed mild stenosis bilaterally.  She noted some heart racing.  She wore an ambulatory monitor that revealed some PACs and 4 beats of SVT.  At her visit 09/2021 she continued to note some palpitations, especially when lying down at night.  She was also struggling with vertigo intermittently.  Today, she is accompanied by a family member. She states she is feeling okay from a cardiovascular perspective. Her main complaints are generic pains and issues that she attributes to her age. About a month ago she had a COVID infection. Since then she had struggled with some more confusion and dementia-type symptoms. Currently she is feeling better but not yet at her baseline. They have been discussing going to a nearby gym with [employer]. She has scheduled follow-up with her PCP to review her recent labs with abnormal liver and kidney function. She denies any  palpitations, chest pain, shortness of breath, peripheral edema, lightheadedness, headaches, syncope, orthopnea, or PND.  ROS:  Please see the history of present illness. All other systems are reviewed and negative.   Studies Reviewed: .       10/20/22: Sinus rhythm.  Rate 65 bpm.  Low voltage.   Risk Assessment/Calculations:             Physical Exam:    VS:  BP 112/62 (BP Location: Left Arm, Patient Position: Sitting, Cuff Size: Large)   Pulse 65   Ht 5\' 5"  (1.651 m)   Wt 178 lb 8 oz (81 kg)   BMI 29.70 kg/m  , BMI Body mass index is 29.7 kg/m. GENERAL:  Well appearing HEENT: Pupils equal round and reactive, fundi not visualized, oral mucosa unremarkable NECK:  No jugular venous distention, waveform within normal limits, carotid upstroke brisk and symmetric, no bruits, no thyromegaly LUNGS:  Clear to auscultation bilaterally HEART:  RRR.  PMI not displaced or sustained,S1 and S2 within normal limits, no S3, no S4, no clicks, no rubs, no murmurs ABD:  Flat, positive bowel sounds normal in frequency in pitch, no bruits, no rebound, no guarding, no midline pulsatile mass, no hepatomegaly, no splenomegaly EXT:  2 plus pulses throughout, no edema, no cyanosis no clubbing SKIN:  No rashes no nodules NEURO:  Cranial nerves II through XII grossly intact, motor grossly intact throughout PSYCH:  Cognitively intact, oriented to person place and time  Wt Readings from Last 3 Encounters:  10/20/22 178 lb 8 oz (81 kg)  08/01/22 184 lb 4 oz (83.6 kg)  07/05/22 183 lb (83 kg)     ASSESSMENT AND PLAN: .    # Physical Inactivity Discussed the importance of regular physical activity and the potential benefits of participating in a structured exercise program. Patient's family member mentioned a nearby gym with a [employer] program. -Encouraged patient to attend two exercise classes per week. -Recommended patient to consider the PREP program at the [facility] or the [employer] program at  the nearby gym.  # Hyperlipidemia Cholesterol levels are well-controlled with Repatha. Triglycerides slightly elevated but improved from previous levels. -Continue Repatha as prescribed. -Encouraged physical activity and healthy diet to further improve triglyceride levels.  # Carotid Stenosis:  Stable mild plaque in the arteries. Last ultrasound in May 2023 showed mild disease. -Plan to repeat carotid doppler ultrasound in May 2025. -Continue daily aspirin for cardiovascular protection.   # Fatty Liver Disease:  LFTs mildly elevated.  She has GI follow up pending.  Continue Repatha and Zetia unless they disagree.  No longer on statins.  Increase exercise and encourage weight loss as above.   Follow-up Plan to see patient after the carotid doppler ultrasound in May 2025.            Dispo:  FU with Peyton Rossner C. Duke Salvia, MD, [facility] in May 2025 following repeat carotid dopplers.  I,Mathew Stumpf,acting as a Neurosurgeon for Chilton Si, MD.,have documented all relevant documentation on the behalf of Chilton Si, MD,as directed by  Chilton Si, MD while in the presence of Chilton Si, MD.  I, Ta Fair C. Duke Salvia, MD have reviewed all documentation for this visit.  The documentation of the exam, diagnosis, procedures, and orders on 10/20/2022 are all accurate and complete.   Signed, Chilton Si, MD

## 2022-10-24 ENCOUNTER — Ambulatory Visit: Payer: PPO

## 2022-10-24 DIAGNOSIS — Z944 Liver transplant status: Secondary | ICD-10-CM | POA: Diagnosis not present

## 2022-10-24 DIAGNOSIS — K760 Fatty (change of) liver, not elsewhere classified: Secondary | ICD-10-CM | POA: Diagnosis not present

## 2022-10-24 DIAGNOSIS — R748 Abnormal levels of other serum enzymes: Secondary | ICD-10-CM

## 2022-10-24 DIAGNOSIS — R7989 Other specified abnormal findings of blood chemistry: Secondary | ICD-10-CM | POA: Diagnosis not present

## 2022-10-24 DIAGNOSIS — R932 Abnormal findings on diagnostic imaging of liver and biliary tract: Secondary | ICD-10-CM | POA: Diagnosis not present

## 2022-11-02 ENCOUNTER — Ambulatory Visit: Payer: PPO | Admitting: Family Medicine

## 2022-11-02 DIAGNOSIS — Z0000 Encounter for general adult medical examination without abnormal findings: Secondary | ICD-10-CM | POA: Diagnosis not present

## 2022-11-02 NOTE — Progress Notes (Signed)
MEDICARE ANNUAL WELLNESS VISIT  11/02/2022  Telephone Visit Disclaimer This Medicare AWV was conducted by telephone due to national recommendations for restrictions regarding the COVID-19 Pandemic (e.g. social distancing).  I verified, using two identifiers, that I am speaking with Cheryl [name] or their authorized healthcare agent. I discussed the limitations, risks, security, and privacy concerns of performing an evaluation and management service by telephone and the potential availability of an in-person appointment in the future. The patient expressed understanding and agreed to proceed.  Location of Patient: Home Location of Provider (nurse):  In the office.  Subjective:    Cheryl [name] is a 82 y.o. female patient of Tamera Punt, Erika S, DO who had a Medicare Annual Wellness Visit today via telephone. Cheryl [name] is Retired and lives alone. she has 5 children. she reports that she is socially active and does interact with friends/family regularly. she is moderately physically active and enjoys watching television.  Patient Care Team: Charlton Amor, DO as PCP - General (Family Medicine) Hilarie Fredrickson, MD as Attending Physician (Gastroenterology) Odette Fraction, MD (Inactive) as Attending Physician (Pain Medicine) Nelson Chimes, MD as Consulting Physician (Ophthalmology) Gabriel Carina, [facility] as Pharmacist (Pharmacist) Janalyn Harder, MD (Inactive) as Consulting Physician (Dermatology)     11/02/2022    8:51 AM 12/07/2021   12:37 PM 10/29/2021    9:06 AM 08/03/2021   11:11 AM 07/25/2021    9:32 AM 12/22/2020    8:58 AM 10/17/2020   10:18 AM  Advanced Directives  Does Patient Have a Medical Advance Directive? Yes Yes No Yes No Yes Yes  Type of Advance Directive Living will Healthcare Power of [place];Living will  Healthcare Power of [place];Living will  Living will;Healthcare Power of [employer] Power of Attorney  Does patient want to make changes to medical advance directive? No -  Patient declined No - Patient declined  No - Patient declined     Copy of Healthcare Power of Attorney in Chart?  No - copy requested  No - copy requested  No - copy requested Yes - validated most recent copy scanned in chart (See row information)  Would patient like information on creating a medical advance directive?   No - Patient declined No - Patient declined No - Patient declined      Hospital Utilization Over the Past 12 Months: # of hospitalizations or ER visits: 0 # of surgeries: 0  Review of Systems    Patient reports that her overall health is unchanged compared to last year.  History obtained from chart review and the patient  Patient Reported Readings (BP, Pulse, CBG, Weight, etc) none Per patient no change in vitals since last visit, unable to obtain new vitals due to telehealth visit  Pain Assessment Pain : No/denies pain     Current Medications & Allergies (verified) Allergies as of 11/02/2022       Reactions   Celebrex [celecoxib] Itching, Nausea Only, Other (See Comments)   Gas, hair loss also   Fluoxetine Hcl Shortness Of Breath, Itching   Ambien [zolpidem] Other (See Comments)   Headaches    Amitriptyline Other (See Comments)   Dry mouth   Aricept [donepezil Hcl] Other (See Comments)   Nightmares   Ciprofloxacin Other (See Comments)   Muscle weakness   Contrast Media [iodinated Contrast Media] Itching, Other (See Comments)   MultiHance (probably not "Multilance")   Dexamethasone Diarrhea, Nausea And Vomiting   Livalo [pitavastatin] Other (See Comments)   Myalgia  Metrizamide Itching, Other (See Comments)   AMIPAQUE (also allergic to MultiHance (probably not "Multilance")   Nitroglycerin Nausea Only, Other (See Comments)   Dizziness and sweating, also   Rosuvastatin Other (See Comments)   Body aches and cramping   Simvastatin Other (See Comments)   Muscle aches and headaches   Statins Other (See Comments)   Could NOT tolerate- "Made me feel like  I had the flu"   Sulfonamide Derivatives Other (See Comments)   Codeine Sulfate Itching        Medication List        Accurate as of November 02, 2022  9:08 AM. If you have any questions, ask your nurse or doctor.          AMBULATORY NON FORMULARY MEDICATION Take 1 capsule by mouth daily. Medication Name: HYPER BIOTIC PROBIOTICS FOR WOMEN   aspirin 81 MG tablet Take 81 mg by mouth daily.   conjugated estrogens vaginal cream Commonly known as: PREMARIN 1 pea sized application to urethra and vaginal opening nightly x 10 days and then 3 x a week, thereafter.   CurcuPlex-95 500 MG Caps Generic drug: Turmeric Take 1 capsule by mouth daily.   cycloSPORINE 0.05 % ophthalmic emulsion Commonly known as: Restasis Place 1 drop into both eyes 2 (two) times daily.   escitalopram 10 MG tablet Commonly known as: LEXAPRO Take 1 tablet (10 mg total) by mouth daily.   Excedrin Migraine 250-250-65 MG tablet Generic drug: aspirin-acetaminophen-caffeine Take 2 tablets by mouth every 6 (six) hours as needed for migraine or headache.   hydrOXYzine 25 MG capsule Commonly known as: VISTARIL TAKE 1 CAPSULE (25 MG TOTAL) BY MOUTH TWICE A DAY AS NEEDED   MAGNESIUM PO Take 1 tablet by mouth daily.   Multi-Vitamin tablet Take 1 tablet by mouth daily.   nystatin powder Commonly known as: MYCOSTATIN/NYSTOP Apply 1 Application topically 2 (two) times daily as needed.   omeprazole 20 MG capsule Commonly known as: PRILOSEC Take 1 capsule (20 mg total) by mouth daily before breakfast.   polyethylene glycol 17 g packet Commonly known as: MIRALAX / GLYCOLAX Take 17 g by mouth daily.   Repatha SureClick 140 MG/ML Soaj Generic drug: Evolocumab INJECT 140 MG INTO THE SKIN EVERY 14 (FOURTEEN) DAYS.   rivastigmine 4.6 mg/24hr Commonly known as: EXELON Place 4.6 mg onto the skin daily.   traMADol 50 MG tablet Commonly known as: ULTRAM Take 1 tablet (50 mg total) by mouth 2 (two) times  daily as needed (for pain).   Ubrelvy 100 MG Tabs Generic drug: Ubrogepant Take 1 tablet (100 mg total) by mouth every 2 (two) hours as needed. Maximum 200mg  a day.   Vitamin D 1000 units capsule Take 2,000 Units by mouth daily.        History (reviewed): Past Medical History:  Diagnosis Date   Anemia    Anxiety    d/t recent family issues   Arthritis    Bone spur    Carotid stenosis, bilateral 06/06/2019   Chronic back pain    stenosis and OA   Dementia (HCC)    Depression    takes Paxil daily   Diverticulosis    Dizzy    occasionally   Encephalitis    at 9 months ago    Fatty liver    has had 2 Hep shots and the final one is in June 18   GERD (gastroesophageal reflux disease)    takes Omeprazole daily   Hepatitis 2012  History of bronchitis    20-25 yrs ago   History of colon polyps    precancerous   History of shingles    Hyperlipidemia    takes Simvastatin daily   IBS (irritable bowel syndrome)    more on side of constipation-takes Miralax daily   Insomnia    takes Melatonin and Ambien nightly   Internal hemorrhoids    Joint pain    Joint swelling    Lower leg edema 09/21/2021   Lung nodules    calcified    Migraine headache    migraines-thinks coming from neck.Last one1/28/18   Orthostatic hypotension 09/21/2021   Pneumonia    hx of-20 yrs ago-=-walking   Renal cyst    left   Uterine cancer (HCC) 1973   Past Surgical History:  Procedure Laterality Date   APPENDECTOMY  1966   cataracts     bilateral   COLONOSCOPY  09/04/2020   EUS  02/10/2011   Procedure: UPPER ENDOSCOPIC ULTRASOUND (EUS) RADIAL;  Surgeon: Rob Bunting, MD;  Location: [facility];  Service: Endoscopy;  Laterality: N/A;  radial linear    LUMBAR FUSION  03/2016   L4-5, Dr. Sampson Goon.   RECTOCELE REPAIR  2011   ROTATOR CUFF REPAIR  2009   left   TONSILLECTOMY     UPPER GASTROINTESTINAL ENDOSCOPY     VAGINAL HYSTERECTOMY  1973   Family History  Problem Relation Age  of Onset   Kidney disease Mother    Kidney failure Mother        med induced   Rheum arthritis Mother    Heart disease Father    Alcohol abuse Father    Lung cancer Father        smoker   Depression Brother    Crohn's disease Brother    Alcohol abuse Brother    Heart failure Daughter    Lung cancer Son    HIV Son    Heart disease Other        Paternal family    Malignant hyperthermia Neg Hx    Colon cancer Neg Hx    Esophageal cancer Neg Hx    Rectal cancer Neg Hx    Stomach cancer Neg Hx    Migraines Neg Hx    Social History   Socioeconomic History   Marital status: Divorced    Spouse name: Not on file   Number of children: 5   Years of education: 9   Highest education level: 9th grade  Occupational History   Occupation: Retired      Associate Professor: RETIRED  Tobacco Use   Smoking status: Former    Current packs/day: 0.00    Types: Cigarettes    Quit date: 02/07/1993    Years since quitting: 29.7   Smokeless tobacco: Never  Vaping Use   Vaping status: Never Used  Substance and Sexual Activity   Alcohol use: No   Drug use: No   Sexual activity: Not on file  Other Topics Concern   Not on file  Social History Narrative   Lives with her dog. She enjoys watching television. Her daughter lives close by.                  Social Determinants of Health   Financial Resource Strain: Low Risk  (11/02/2022)   Overall Financial Resource Strain (CARDIA)    Difficulty of Paying Living Expenses: Not hard at all  Food Insecurity: No Food Insecurity (11/02/2022)   Hunger Vital Sign  Worried About Programme researcher, broadcasting/film/video in the Last Year: Never true    Ran Out of Food in the Last Year: Never true  Transportation Needs: No Transportation Needs (11/02/2022)   PRAPARE - Administrator, Civil Service (Medical): No    Lack of Transportation (Non-Medical): No  Physical Activity: Inactive (11/02/2022)   Exercise Vital Sign    Days of Exercise per Week: 0 days    Minutes of  Exercise per Session: 0 min  Stress: No Stress Concern Present (11/02/2022)   [employer] of Occupational Health - Occupational Stress Questionnaire    Feeling of Stress : Not at all  Social Connections: Socially Isolated (11/02/2022)   Social Connection and Isolation Panel [NHANES]    Frequency of Communication with Friends and Family: More than three times a week    Frequency of Social Gatherings with Friends and Family: Three times a week    Attends Religious Services: Never    Active Member of Clubs or Organizations: No    Attends Banker Meetings: Never    Marital Status: Divorced    Activities of Daily Living    11/02/2022    9:00 AM  In your present state of health, do you have any difficulty performing the following activities:  Hearing? 1  Comment bilateral hearing aids  Vision? 1  Difficulty concentrating or making decisions? 1  Comment little bit  Walking or climbing stairs? 0  Dressing or bathing? 0  Doing errands, shopping? 1  Comment usually goes with daughter  Quarry manager and eating ? N  Using the Toilet? N  In the past six months, have you accidently leaked urine? N  Do you have problems with loss of bowel control? N  Managing your Medications? Y  Comment her daughter helps  Managing your Finances? Y  Comment her daughter helps with that  Housekeeping or managing your Housekeeping? Y  Comment her daughter helps with that    Patient Education/ Literacy How often do you need to have someone help you when you read instructions, pamphlets, or other written materials from your doctor or pharmacy?: 3 - Sometimes What is the last grade level you completed in school?: 9th  Exercise    Diet Patient reports consuming 2 meals a day and 1 snack(s) a day Patient reports that her primary diet is: Regular Patient reports that she does have regular access to food.   Depression Screen    11/02/2022    8:51 AM 08/01/2022   11:10 AM 03/29/2022     3:11 PM 03/29/2022    3:10 PM 01/21/2022    2:24 PM 01/21/2022    1:50 PM 10/29/2021    9:11 AM  PHQ 2/9 Scores  PHQ - 2 Score 0 0 0 0 0 0 0  PHQ- 9 Score     3       Fall Risk    11/02/2022    8:51 AM 08/01/2022   11:10 AM 07/05/2022   10:05 AM 03/29/2022    3:08 PM 01/21/2022    2:24 PM  Fall Risk   Falls in the past year? 0 0 0 0 0  Number falls in past yr: 0 0 0 0 0  Injury with Fall? 0 0 0 0 0  Risk for fall due to : No Fall Risks No Fall Risks No Fall Risks No Fall Risks No Fall Risks  Follow up Falls evaluation completed Falls evaluation completed Falls evaluation completed Falls evaluation  completed Falls evaluation completed     Objective:  Cheryl [name] seemed alert and oriented and she participated appropriately during our telephone visit.  Blood Pressure Weight BMI  BP Readings from Last 3 Encounters:  10/20/22 112/62  08/01/22 (!) 114/53  07/05/22 (!) 117/51   Wt Readings from Last 3 Encounters:  10/20/22 178 lb 8 oz (81 kg)  08/01/22 184 lb 4 oz (83.6 kg)  07/05/22 183 lb (83 kg)   BMI Readings from Last 1 Encounters:  10/20/22 29.70 kg/m    *Unable to obtain current vital signs, weight, and BMI due to telephone visit type  Hearing/Vision  Cheryl [name] did not seem to have difficulty with hearing/understanding during the telephone conversation Reports that she has not had a formal eye exam by an eye care professional within the past year Reports that she has not had a formal hearing evaluation within the past year *Unable to fully assess hearing and vision during telephone visit type  Cognitive Function:    11/02/2022    9:03 AM 10/29/2021    9:13 AM 10/17/2020   10:32 AM 12/31/2018    9:12 AM 12/22/2016    9:34 AM  6CIT Screen  What Year? 0 points 0 points 0 points 0 points 0 points  What month? 0 points 0 points 0 points 0 points 0 points  What time? 0 points 0 points 0 points 0 points 0 points  Count back from 20 0 points 2 points 0 points 0 points 0  points  Months in reverse 0 points 0 points  0 points 0 points  Repeat phrase 10 points 6 points 0 points 2 points 2 points  Total Score 10 points 8 points  2 points 2 points   (Normal:0-7, Significant for Dysfunction: >8)  Normal Cognitive Function Screening: No: has been diagnosed with dementia.    Immunization & Health Maintenance Record Immunization History  Administered Date(s) Administered   Fluad Quad(high Dose 65+) 10/04/2018, 11/19/2019, 10/22/2021   Hep A / Hep B 12/22/2015, 01/19/2016, 06/20/2016   Influenza Split 12/20/2011   Influenza Whole 11/08/2004, 11/08/2005, 11/24/2006, 11/12/2007, 12/15/2008, 11/27/2009   Influenza, High Dose Seasonal PF 09/26/2016, 12/09/2020   Influenza,inj,Quad PF,6+ Mos 10/10/2012, 10/01/2013, 12/31/2014, 10/05/2015, 09/21/2017   Influenza-Unspecified 09/21/2017   PFIZER Comirnaty(Gray Top)Covid-19 Tri-Sucrose Vaccine 06/25/2020   PFIZER(Purple Top)SARS-COV-2 Vaccination 03/01/2019, 03/22/2019, 12/21/2019, 06/25/2020   Pfizer Covid-19 Vaccine Bivalent Booster 16yrs & up 12/09/2020, 07/06/2021   Pfizer(Comirnaty)Fall Seasonal Vaccine 12 years and older 12/10/2021   Pneumococcal Conjugate-13 12/17/2013   Pneumococcal Polysaccharide-23 02/07/2005, 12/15/2010   Td 02/07/2002   Tdap 10/10/2012   Zoster Recombinant(Shingrix) 01/24/2020, 05/15/2020   Zoster, Live 11/27/2009    Health Maintenance  Topic Date Due   DTaP/Tdap/Td (3 - Td or Tdap) 10/11/2022   COVID-19 Vaccine (9 - 2023-24 season) 11/18/2022 (Originally 10/09/2022)   INFLUENZA VACCINE  05/08/2023 (Originally 09/08/2022)   DEXA SCAN  10/24/2023   Medicare Annual Wellness (AWV)  11/02/2023   Colonoscopy  09/04/2025   Pneumonia Vaccine 43+ Years old  Completed   Zoster Vaccines- Shingrix  Completed   HPV VACCINES  Aged Out   Hepatitis C Screening  Discontinued       Assessment  This is a routine wellness examination for Cheryl [name].  Health Maintenance: Due or Overdue Health  Maintenance Due  Topic Date Due   DTaP/Tdap/Td (3 - Td or Tdap) 10/11/2022    Cheryl [name] does not need a referral for Community Assistance: Care Management:  no Social Work:    no Prescription Assistance:  no Nutrition/Diabetes Education:  no   Plan:  Personalized Goals  Goals Addressed               This Visit's Progress     Patient Stated (pt-stated)        11/02/2022 AWV Goal: Exercise for General Health  Patient will verbalize understanding of the benefits of increased physical activity: Exercising regularly is important. It will improve your overall fitness, flexibility, and endurance. Regular exercise also will improve your overall health. It can help you control your weight, reduce stress, and improve your bone density. Over the next year, patient will increase physical activity as tolerated with a goal of at least 150 minutes of moderate physical activity per week.  You can tell that you are exercising at a moderate intensity if your heart starts beating faster and you start breathing faster but can still hold a conversation. Moderate-intensity exercise ideas include: Walking 1 mile (1.6 km) in about 15 minutes Biking Hiking Golfing Dancing Water aerobics Patient will verbalize understanding of everyday activities that increase physical activity by providing examples like the following: Yard work, such as: Insurance underwriter Gardening Washing windows or floors Patient will be able to explain general safety guidelines for exercising:  Before you start a new exercise program, talk with your health care provider. Do not exercise so much that you hurt yourself, feel dizzy, or get very short of breath. Wear comfortable clothes and wear shoes with good support. Drink plenty of water while you exercise to prevent dehydration or heat stroke. Work out until your breathing and your  heartbeat get faster.        Personalized Health Maintenance & Screening Recommendations  Influenza vaccine Td vaccine Bone density scan due next year.   Lung Cancer Screening Recommended: no (Low Dose CT Chest recommended if Age 72-80 years, 20 pack-year currently smoking OR have quit w/in past 15 years) Hepatitis C Screening recommended: no HIV Screening recommended: no  Advanced Directives: Written information was not prepared per patient's request.  Referrals & Orders No orders of the defined types were placed in this encounter.   Follow-up Plan Follow-up with Charlton Amor, DO as planned Schedule td vaccine at the pharmacy.  Schedule influenza vaccine at the pharmacy or in office.  Bone density scan will be due next year. Medicare wellness visit in one year.  AVS printed and mailed to the patient.    I have personally reviewed and noted the following in the patient's chart:   Medical and social history Use of alcohol, tobacco or illicit drugs  Current medications and supplements Functional ability and status Nutritional status Physical activity Advanced directives List of other physicians Hospitalizations, surgeries, and ER visits in previous 12 months Vitals Screenings to include cognitive, depression, and falls Referrals and appointments  In addition, I have reviewed and discussed with Cheryl [name] certain preventive protocols, quality metrics, and best practice recommendations. A written personalized care plan for preventive services as well as general preventive health recommendations is available and can be mailed to the patient at her request.      Modesto Charon, RN BSN  11/02/2022

## 2022-11-02 NOTE — Patient Instructions (Addendum)
MEDICARE ANNUAL WELLNESS VISIT Health Maintenance Summary and Written Plan of Care  Ms. Cheryl [name] ,  Thank you for allowing me to perform your Medicare Annual Wellness Visit and for your ongoing commitment to your health.   Health Maintenance & Immunization History Health Maintenance  Topic Date Due   DTaP/Tdap/Td (3 - Td or Tdap) 10/11/2022   COVID-19 Vaccine (9 - 2023-24 season) 11/18/2022 (Originally 10/09/2022)   INFLUENZA VACCINE  05/08/2023 (Originally 09/08/2022)   DEXA SCAN  10/24/2023   Medicare Annual Wellness (AWV)  11/02/2023   Colonoscopy  09/04/2025   Pneumonia Vaccine 34+ Years old  Completed   Zoster Vaccines- Shingrix  Completed   HPV VACCINES  Aged Out   Hepatitis C Screening  Discontinued   Immunization History  Administered Date(s) Administered   Fluad Quad(high Dose 65+) 10/04/2018, 11/19/2019, 10/22/2021   Hep A / Hep B 12/22/2015, 01/19/2016, 06/20/2016   Influenza Split 12/20/2011   Influenza Whole 11/08/2004, 11/08/2005, 11/24/2006, 11/12/2007, 12/15/2008, 11/27/2009   Influenza, High Dose Seasonal PF 09/26/2016, 12/09/2020   Influenza,inj,Quad PF,6+ Mos 10/10/2012, 10/01/2013, 12/31/2014, 10/05/2015, 09/21/2017   Influenza-Unspecified 09/21/2017   PFIZER Comirnaty(Gray Top)Covid-19 Tri-Sucrose Vaccine 06/25/2020   PFIZER(Purple Top)SARS-COV-2 Vaccination 03/01/2019, 03/22/2019, 12/21/2019, 06/25/2020   Pfizer Covid-19 Vaccine Bivalent Booster 68yrs & up 12/09/2020, 07/06/2021   Pfizer(Comirnaty)Fall Seasonal Vaccine 12 years and older 12/10/2021   Pneumococcal Conjugate-13 12/17/2013   Pneumococcal Polysaccharide-23 02/07/2005, 12/15/2010   Td 02/07/2002   Tdap 10/10/2012   Zoster Recombinant(Shingrix) 01/24/2020, 05/15/2020   Zoster, Live 11/27/2009    These are the patient goals that we discussed:  Goals Addressed               This Visit's Progress     Patient Stated (pt-stated)        11/02/2022 AWV Goal: Exercise for General  Health  Patient will verbalize understanding of the benefits of increased physical activity: Exercising regularly is important. It will improve your overall fitness, flexibility, and endurance. Regular exercise also will improve your overall health. It can help you control your weight, reduce stress, and improve your bone density. Over the next year, patient will increase physical activity as tolerated with a goal of at least 150 minutes of moderate physical activity per week.  You can tell that you are exercising at a moderate intensity if your heart starts beating faster and you start breathing faster but can still hold a conversation. Moderate-intensity exercise ideas include: Walking 1 mile (1.6 km) in about 15 minutes Biking Hiking Golfing Dancing Water aerobics Patient will verbalize understanding of everyday activities that increase physical activity by providing examples like the following: Yard work, such as: Insurance underwriter Gardening Washing windows or floors Patient will be able to explain general safety guidelines for exercising:  Before you start a new exercise program, talk with your health care provider. Do not exercise so much that you hurt yourself, feel dizzy, or get very short of breath. Wear comfortable clothes and wear shoes with good support. Drink plenty of water while you exercise to prevent dehydration or heat stroke. Work out until your breathing and your heartbeat get faster.          This is a list of Health Maintenance Items that are overdue or due now: Influenza vaccine Td vaccine Bone density scan due next year.   Orders/Referrals Placed Today: No orders of the defined types were placed in this encounter.  (  Contact our referral department at [phone] if you have not spoken with someone about your referral appointment within the next 5 days)    Follow-up  Plan Follow-up with Charlton Amor, DO as planned Schedule td vaccine at the pharmacy.  Schedule influenza vaccine at the pharmacy or in office.  Bone density scan will be due next year. Medicare wellness visit in one year.  AVS printed and mailed to the patient.       Health Maintenance, Female Adopting a healthy lifestyle and getting preventive care are important in promoting health and wellness. Ask your health care provider about: The right schedule for you to have regular tests and exams. Things you can do on your own to prevent diseases and keep yourself healthy. What should I know about diet, weight, and exercise? Eat a healthy diet  Eat a diet that includes plenty of vegetables, fruits, low-fat dairy products, and lean protein. Do not eat a lot of foods that are high in solid fats, added sugars, or sodium. Maintain a healthy weight Body mass index (BMI) is used to identify weight problems. It estimates body fat based on height and weight. Your health care provider can help determine your BMI and help you achieve or maintain a healthy weight. Get regular exercise Get regular exercise. This is one of the most important things you can do for your health. Most adults should: Exercise for at least 150 minutes each week. The exercise should increase your heart rate and make you sweat (moderate-intensity exercise). Do strengthening exercises at least twice a week. This is in addition to the moderate-intensity exercise. Spend less time sitting. Even light physical activity can be beneficial. Watch cholesterol and blood lipids Have your blood tested for lipids and cholesterol at 80 years of age, then have this test every 5 years. Have your cholesterol levels checked more often if: Your lipid or cholesterol levels are high. You are older than 80 years of age. You are at high risk for heart disease. What should I know about cancer screening? Depending on your health history and family  history, you may need to have cancer screening at various ages. This may include screening for: Breast cancer. Cervical cancer. Colorectal cancer. Skin cancer. Lung cancer. What should I know about heart disease, diabetes, and high blood pressure? Blood pressure and heart disease High blood pressure causes heart disease and increases the risk of stroke. This is more likely to develop in people who have high blood pressure readings or are overweight. Have your blood pressure checked: Every 3-5 years if you are 60-107 years of age. Every year if you are 75 years old or older. Diabetes Have regular diabetes screenings. This checks your fasting blood sugar level. Have the screening done: Once every three years after age 31 if you are at a normal weight and have a low risk for diabetes. More often and at a younger age if you are overweight or have a high risk for diabetes. What should I know about preventing infection? Hepatitis B If you have a higher risk for hepatitis B, you should be screened for this virus. Talk with your health care provider to find out if you are at risk for hepatitis B infection. Hepatitis C Testing is recommended for: Everyone born from [birth year] through 1963. Anyone with known risk factors for hepatitis C. Sexually transmitted infections (STIs) Get screened for STIs, including gonorrhea and chlamydia, if: You are sexually active and are younger than 80 years of age. You  are older than 80 years of age and your health care provider tells you that you are at risk for this type of infection. Your sexual activity has changed since you were last screened, and you are at increased risk for chlamydia or gonorrhea. Ask your health care provider if you are at risk. Ask your health care provider about whether you are at high risk for HIV. Your health care provider may recommend a prescription medicine to help prevent HIV infection. If you choose to take medicine to prevent HIV, you  should first get tested for HIV. You should then be tested every 3 months for as long as you are taking the medicine. Pregnancy If you are about to stop having your period (premenopausal) and you may become pregnant, seek counseling before you get pregnant. Take 400 to 800 micrograms (mcg) of folic acid every day if you become pregnant. Ask for birth control (contraception) if you want to prevent pregnancy. Osteoporosis and menopause Osteoporosis is a disease in which the bones lose minerals and strength with aging. This can result in bone fractures. If you are 1 years old or older, or if you are at risk for osteoporosis and fractures, ask your health care provider if you should: Be screened for bone loss. Take a calcium or vitamin D supplement to lower your risk of fractures. Be given hormone replacement therapy (HRT) to treat symptoms of menopause. Follow these instructions at home: Alcohol use Do not drink alcohol if: Your health care provider tells you not to drink. You are pregnant, may be pregnant, or are planning to become pregnant. If you drink alcohol: Limit how much you have to: 0-1 drink a day. Know how much alcohol is in your drink. In the [place] one drink equals one 12 oz bottle of beer (355 mL), one 5 oz glass of wine (148 mL), or one 1 oz glass of hard liquor (44 mL). Lifestyle Do not use any products that contain nicotine or tobacco. These products include cigarettes, chewing tobacco, and vaping devices, such as e-cigarettes. If you need help quitting, ask your health care provider. Do not use street drugs. Do not share needles. Ask your health care provider for help if you need support or information about quitting drugs. General instructions Schedule regular health, dental, and eye exams. Stay current with your vaccines. Tell your health care provider if: You often feel depressed. You have ever been abused or do not feel safe at home. Summary Adopting a healthy  lifestyle and getting preventive care are important in promoting health and wellness. Follow your health care provider's instructions about healthy diet, exercising, and getting tested or screened for diseases. Follow your health care provider's instructions on monitoring your cholesterol and blood pressure. This information is not intended to replace advice given to you by your health care provider. Make sure you discuss any questions you have with your health care provider. Document Revised: 06/15/2020 Document Reviewed: 06/15/2020 Elsevier Patient Education  2024 [employer].

## 2022-11-04 DIAGNOSIS — F01B3 Vascular dementia, moderate, with mood disturbance: Secondary | ICD-10-CM | POA: Diagnosis not present

## 2022-11-07 ENCOUNTER — Telehealth: Payer: Self-pay | Admitting: Adult Health

## 2022-11-07 NOTE — Progress Notes (Signed)
Please call patient and let her know that the ultrasound showed fatty liver which is a very common cause to bump up liver enzymes.  They also saw what looks like either gallstones or sludge in the gallbladder.  But no acute inflammation which is great.  Elastography component of the ultrasound was good which is reassuring that there is no cirrhosis.  No worrisome findings of the kidneys.

## 2022-11-07 NOTE — Telephone Encounter (Signed)
Pt cancelled appt due to have not had a migraine in 6 months

## 2022-11-08 ENCOUNTER — Other Ambulatory Visit: Payer: Self-pay | Admitting: Family Medicine

## 2022-11-08 DIAGNOSIS — R748 Abnormal levels of other serum enzymes: Secondary | ICD-10-CM

## 2022-11-08 DIAGNOSIS — R7303 Prediabetes: Secondary | ICD-10-CM

## 2022-11-08 DIAGNOSIS — E7800 Pure hypercholesterolemia, unspecified: Secondary | ICD-10-CM

## 2022-11-09 ENCOUNTER — Ambulatory Visit: Payer: PPO | Admitting: Adult Health

## 2022-11-10 ENCOUNTER — Ambulatory Visit: Payer: PPO | Admitting: Family Medicine

## 2022-11-10 ENCOUNTER — Other Ambulatory Visit: Payer: Self-pay | Admitting: Family Medicine

## 2022-11-10 DIAGNOSIS — G992 Myelopathy in diseases classified elsewhere: Secondary | ICD-10-CM

## 2022-11-11 NOTE — Telephone Encounter (Signed)
Routing to pt's new pcp Dr. Alverda Skeans for signature

## 2022-11-14 ENCOUNTER — Ambulatory Visit: Payer: PPO | Admitting: Family Medicine

## 2022-11-14 DIAGNOSIS — R7303 Prediabetes: Secondary | ICD-10-CM | POA: Diagnosis not present

## 2022-11-14 DIAGNOSIS — E7800 Pure hypercholesterolemia, unspecified: Secondary | ICD-10-CM | POA: Diagnosis not present

## 2022-11-14 DIAGNOSIS — R748 Abnormal levels of other serum enzymes: Secondary | ICD-10-CM | POA: Diagnosis not present

## 2022-11-15 ENCOUNTER — Ambulatory Visit: Payer: PPO

## 2022-11-15 ENCOUNTER — Encounter: Payer: Self-pay | Admitting: Family Medicine

## 2022-11-15 ENCOUNTER — Ambulatory Visit (INDEPENDENT_AMBULATORY_CARE_PROVIDER_SITE_OTHER): Payer: PPO | Admitting: Family Medicine

## 2022-11-15 VITALS — BP 113/55 | HR 71 | Ht 65.0 in | Wt 177.2 lb

## 2022-11-15 DIAGNOSIS — Z780 Asymptomatic menopausal state: Secondary | ICD-10-CM | POA: Insufficient documentation

## 2022-11-15 DIAGNOSIS — G8929 Other chronic pain: Secondary | ICD-10-CM

## 2022-11-15 DIAGNOSIS — R2689 Other abnormalities of gait and mobility: Secondary | ICD-10-CM | POA: Insufficient documentation

## 2022-11-15 DIAGNOSIS — R7401 Elevation of levels of liver transaminase levels: Secondary | ICD-10-CM

## 2022-11-15 DIAGNOSIS — M48061 Spinal stenosis, lumbar region without neurogenic claudication: Secondary | ICD-10-CM | POA: Diagnosis not present

## 2022-11-15 DIAGNOSIS — M4184 Other forms of scoliosis, thoracic region: Secondary | ICD-10-CM | POA: Diagnosis not present

## 2022-11-15 DIAGNOSIS — M545 Low back pain, unspecified: Secondary | ICD-10-CM

## 2022-11-15 DIAGNOSIS — M47817 Spondylosis without myelopathy or radiculopathy, lumbosacral region: Secondary | ICD-10-CM | POA: Diagnosis not present

## 2022-11-15 LAB — CMP14+EGFR
ALT: 51 [IU]/L — ABNORMAL HIGH (ref 0–32)
AST: 57 [IU]/L — ABNORMAL HIGH (ref 0–40)
Albumin: 4.3 g/dL (ref 3.8–4.8)
Alkaline Phosphatase: 80 [IU]/L (ref 44–121)
BUN/Creatinine Ratio: 18 (ref 12–28)
BUN: 17 mg/dL (ref 8–27)
Bilirubin Total: 0.5 mg/dL (ref 0.0–1.2)
CO2: 23 mmol/L (ref 20–29)
Calcium: 10.3 mg/dL (ref 8.7–10.3)
Chloride: 103 mmol/L (ref 96–106)
Creatinine, Ser: 0.96 mg/dL (ref 0.57–1.00)
Globulin, Total: 2.3 g/dL (ref 1.5–4.5)
Glucose: 120 mg/dL — ABNORMAL HIGH (ref 70–99)
Potassium: 4.3 mmol/L (ref 3.5–5.2)
Sodium: 140 mmol/L (ref 134–144)
Total Protein: 6.6 g/dL (ref 6.0–8.5)
eGFR: 60 mL/min/{1.73_m2} (ref >59)

## 2022-11-15 LAB — HEMOGLOBIN A1C
Est. average glucose Bld gHb Est-mCnc: 120 mg/dL
Hgb A1c MFr Bld: 5.8 % — ABNORMAL HIGH (ref 4.8–5.6)

## 2022-11-15 LAB — LIPID PANEL
Chol/HDL Ratio: 4 {ratio} (ref 0.0–4.4)
Cholesterol, Total: 158 mg/dL (ref 100–199)
HDL: 40 mg/dL (ref >39)
LDL Chol Calc (NIH): 91 mg/dL (ref 0–99)
Triglycerides: 157 mg/dL — ABNORMAL HIGH (ref 0–149)
VLDL Cholesterol Cal: 27 mg/dL (ref 5–40)

## 2022-11-15 NOTE — Progress Notes (Signed)
Established patient visit   Patient: Cheryl [name]   DOB: 1940-09-23   82 y.o. Female  MRN: [id number] Visit Date: 11/15/2022  Today's healthcare provider: Charlton Amor, DO   Chief Complaint  Patient presents with   mood follow up    SUBJECTIVE    Chief Complaint  Patient presents with   mood follow up   HPI  Pt presents to follow up on mood. She is currently on lexapro 10mg  and notes improvement  Also has hx of elevated liver enzymes that are improving based on blood work.   Has noticed some back pain. Says she has chronic back pain.   Review of Systems  Constitutional:  Negative for activity change, fatigue and fever.  Respiratory:  Negative for cough and shortness of breath.   Cardiovascular:  Negative for chest pain.  Gastrointestinal:  Negative for abdominal pain.  Genitourinary:  Negative for difficulty urinating.       Current Meds  Medication Sig   AMBULATORY NON FORMULARY MEDICATION Take 1 capsule by mouth daily. Medication Name: HYPER BIOTIC PROBIOTICS FOR WOMEN   aspirin 81 MG tablet Take 81 mg by mouth daily.   Cholecalciferol (VITAMIN D) 1000 UNITS capsule Take 2,000 Units by mouth daily.   conjugated estrogens (PREMARIN) vaginal cream 1 pea sized application to urethra and vaginal opening nightly x 10 days and then 3 x a week, thereafter.   cycloSPORINE (RESTASIS) 0.05 % ophthalmic emulsion Place 1 drop into both eyes 2 (two) times daily.   escitalopram (LEXAPRO) 10 MG tablet Take 1 tablet (10 mg total) by mouth daily.   Evolocumab (REPATHA SURECLICK) 140 MG/ML SOAJ INJECT 140 MG INTO THE SKIN EVERY 14 (FOURTEEN) DAYS.   EXCEDRIN MIGRAINE 250-250-65 MG tablet Take 2 tablets by mouth every 6 (six) hours as needed for migraine or headache.   hydrOXYzine (VISTARIL) 25 MG capsule TAKE 1 CAPSULE (25 MG TOTAL) BY MOUTH TWICE A DAY AS NEEDED   MAGNESIUM PO Take 1 tablet by mouth daily.    Multiple Vitamin (MULTI-VITAMIN) tablet Take 1 tablet by mouth  daily.   nystatin (MYCOSTATIN/NYSTOP) powder Apply 1 Application topically 2 (two) times daily as needed.   omeprazole (PRILOSEC) 20 MG capsule Take 1 capsule (20 mg total) by mouth daily before breakfast.   polyethylene glycol (MIRALAX / GLYCOLAX) packet Take 17 g by mouth daily.   rivastigmine (EXELON) 4.6 mg/24hr Place 4.6 mg onto the skin daily.   traMADol (ULTRAM) 50 MG tablet TAKE 1 TABLET (50 MG TOTAL) BY MOUTH 2 (TWO) TIMES DAILY AS NEEDED (FOR PAIN).   Turmeric (CURCUPLEX-95) 500 MG CAPS Take 1 capsule by mouth daily.   Ubrogepant (UBRELVY) 100 MG TABS Take 1 tablet (100 mg total) by mouth every 2 (two) hours as needed. Maximum 200mg  a day.    OBJECTIVE    BP (!) 113/55   Pulse 71   Ht 5\' 5"  (1.651 m)   Wt 177 lb 4 oz (80.4 kg)   SpO2 97%   BMI 29.50 kg/m   Physical Exam Vitals and nursing note reviewed.  Constitutional:      General: She is not in acute distress.    Appearance: Normal appearance.  HENT:     Head: Normocephalic and atraumatic.     Right Ear: External ear normal.     Left Ear: External ear normal.     Nose: Nose normal.  Eyes:     Conjunctiva/sclera: Conjunctivae normal.  Cardiovascular:  Rate and Rhythm: Normal rate and regular rhythm.  Pulmonary:     Effort: Pulmonary effort is normal.     Breath sounds: Normal breath sounds.  Musculoskeletal:     Comments: Tenderness to palpation of T spine and some tenderness to paraspinal muscles of thoracic region on right  Neurological:     General: No focal deficit present.     Mental Status: She is alert and oriented to person, place, and time.  Psychiatric:        Mood and Affect: Mood normal.        Behavior: Behavior normal.        Thought Content: Thought content normal.        Judgment: Judgment normal.        ASSESSMENT & PLAN    Problem List Items Addressed This Visit       Other   Transaminitis    Liver enzymes down trending likely related to hepatic steatosis. Patient has had full  liver US with elastography  - can repeat in a few months        Menopause - Primary    Have ordered bone density scan      Relevant Orders   DG Bone Density   Chronic low back pain without sciatica    Pt has a hx of chronic low back pain. Will obtain xrays to rule out compression fracture - have also ordered home health PT to aid in back exercises and strengthening exercises due to balance issues      Relevant Orders   DG Thoracic Spine W/Swimmers   DG Lumbar Spine 2-3 Views   Balance problem   Relevant Orders   Ambulatory referral to Home Health    No follow-ups on file.      No orders of the defined types were placed in this encounter.   Orders Placed This Encounter  Procedures   DG Bone Density    Standing Status:   Future    Standing Expiration Date:   11/15/2023    Order Specific Question:   Reason for Exam (SYMPTOM  OR DIAGNOSIS REQUIRED)    Answer:   screening for osteoporosis    Order Specific Question:   Preferred imaging location?    Answer:   [facility]   DG Thoracic Spine W/Swimmers    Standing Status:   Future    Number of Occurrences:   1    Standing Expiration Date:   11/15/2023    Scheduling Instructions:     Need  AP, lateral, and swimmer's view of the thoracic spine.    Order Specific Question:   Reason for exam:    Answer:   Need  AP, lateral, and swimmer's view of the thoracic spine.    Comments:   Need  AP, lateral, and swimmer's view of the thoracic spine.    Order Specific Question:   Preferred imaging location?    Answer:   [facility]    Order Specific Question:   Release to patient    Answer:   Immediate   DG Lumbar Spine 2-3 Views    Standing Status:   Future    Number of Occurrences:   1    Standing Expiration Date:   05/16/2023    Order Specific Question:   Reason for Exam (SYMPTOM  OR DIAGNOSIS REQUIRED)    Answer:   low back pain    Order Specific Question:   Preferred imaging location?    Answer:  [facility]   Ambulatory referral to Home Health    Referral Priority:   Routine    Referral Type:   Home Health Care    Referral Reason:   Specialty Services Required    Requested Specialty:   Home Health Services    Number of Visits Requested:   1     Charlton Amor, DO  [facility] at [facility] [phone] (phone) [phone] (fax)  [facility]

## 2022-11-15 NOTE — Assessment & Plan Note (Signed)
Have ordered bone density scan

## 2022-11-15 NOTE — Assessment & Plan Note (Signed)
Pt has a hx of chronic low back pain. Will obtain xrays to rule out compression fracture - have also ordered home health PT to aid in back exercises and strengthening exercises due to balance issues

## 2022-11-15 NOTE — Assessment & Plan Note (Addendum)
Liver enzymes down trending likely related to hepatic steatosis. Patient has had full liver US with elastography  - can repeat in a few months

## 2022-11-19 ENCOUNTER — Other Ambulatory Visit: Payer: Self-pay | Admitting: Family Medicine

## 2022-11-19 DIAGNOSIS — L299 Pruritus, unspecified: Secondary | ICD-10-CM

## 2022-11-28 ENCOUNTER — Encounter: Payer: Self-pay | Admitting: Family Medicine

## 2022-11-30 ENCOUNTER — Telehealth: Payer: Self-pay | Admitting: Family Medicine

## 2022-11-30 NOTE — Telephone Encounter (Addendum)
Patient has a PT referral and does not want it to start until next Tuesday  [facility]

## 2022-12-06 ENCOUNTER — Encounter: Payer: Self-pay | Admitting: Family Medicine

## 2022-12-06 DIAGNOSIS — H20011 Primary iridocyclitis, right eye: Secondary | ICD-10-CM | POA: Diagnosis not present

## 2022-12-06 NOTE — Telephone Encounter (Signed)
I called [facility] and had them move it to a stat order.

## 2022-12-12 DIAGNOSIS — H20011 Primary iridocyclitis, right eye: Secondary | ICD-10-CM | POA: Diagnosis not present

## 2022-12-19 DIAGNOSIS — M170 Bilateral primary osteoarthritis of knee: Secondary | ICD-10-CM | POA: Diagnosis not present

## 2022-12-19 DIAGNOSIS — E785 Hyperlipidemia, unspecified: Secondary | ICD-10-CM | POA: Diagnosis not present

## 2022-12-19 DIAGNOSIS — Z9181 History of falling: Secondary | ICD-10-CM | POA: Diagnosis not present

## 2022-12-19 DIAGNOSIS — Z780 Asymptomatic menopausal state: Secondary | ICD-10-CM | POA: Diagnosis not present

## 2022-12-19 DIAGNOSIS — G4700 Insomnia, unspecified: Secondary | ICD-10-CM | POA: Diagnosis not present

## 2022-12-19 DIAGNOSIS — Z7982 Long term (current) use of aspirin: Secondary | ICD-10-CM | POA: Diagnosis not present

## 2022-12-19 DIAGNOSIS — K760 Fatty (change of) liver, not elsewhere classified: Secondary | ICD-10-CM | POA: Diagnosis not present

## 2022-12-19 DIAGNOSIS — M8580 Other specified disorders of bone density and structure, unspecified site: Secondary | ICD-10-CM | POA: Diagnosis not present

## 2022-12-19 DIAGNOSIS — D649 Anemia, unspecified: Secondary | ICD-10-CM | POA: Diagnosis not present

## 2022-12-19 DIAGNOSIS — G8929 Other chronic pain: Secondary | ICD-10-CM | POA: Diagnosis not present

## 2022-12-19 DIAGNOSIS — G43909 Migraine, unspecified, not intractable, without status migrainosus: Secondary | ICD-10-CM | POA: Diagnosis not present

## 2022-12-19 DIAGNOSIS — I6523 Occlusion and stenosis of bilateral carotid arteries: Secondary | ICD-10-CM | POA: Diagnosis not present

## 2022-12-19 DIAGNOSIS — F32A Depression, unspecified: Secondary | ICD-10-CM | POA: Diagnosis not present

## 2022-12-19 DIAGNOSIS — I951 Orthostatic hypotension: Secondary | ICD-10-CM | POA: Diagnosis not present

## 2022-12-19 DIAGNOSIS — H9193 Unspecified hearing loss, bilateral: Secondary | ICD-10-CM | POA: Diagnosis not present

## 2022-12-19 DIAGNOSIS — M5030 Other cervical disc degeneration, unspecified cervical region: Secondary | ICD-10-CM | POA: Diagnosis not present

## 2022-12-19 DIAGNOSIS — F419 Anxiety disorder, unspecified: Secondary | ICD-10-CM | POA: Diagnosis not present

## 2022-12-19 DIAGNOSIS — M5136 Other intervertebral disc degeneration, lumbar region with discogenic back pain only: Secondary | ICD-10-CM | POA: Diagnosis not present

## 2022-12-22 ENCOUNTER — Other Ambulatory Visit: Payer: Self-pay | Admitting: Family Medicine

## 2022-12-22 DIAGNOSIS — F32A Depression, unspecified: Secondary | ICD-10-CM

## 2022-12-26 DIAGNOSIS — H40022 Open angle with borderline findings, high risk, left eye: Secondary | ICD-10-CM | POA: Diagnosis not present

## 2022-12-26 DIAGNOSIS — J019 Acute sinusitis, unspecified: Secondary | ICD-10-CM | POA: Diagnosis not present

## 2022-12-26 DIAGNOSIS — H16223 Keratoconjunctivitis sicca, not specified as Sjogren's, bilateral: Secondary | ICD-10-CM | POA: Diagnosis not present

## 2022-12-26 DIAGNOSIS — H20011 Primary iridocyclitis, right eye: Secondary | ICD-10-CM | POA: Diagnosis not present

## 2023-01-11 ENCOUNTER — Telehealth: Payer: Self-pay

## 2023-01-11 NOTE — Telephone Encounter (Unsigned)
Copied from CRM [id number]. Topic: Clinical - Home Health Verbal Orders >> Jan 11, 2023 11:34 AM Shelbie Proctor wrote: Waldo Laine from [facility] [phone] ext [phone] she states an outstanding order over 20 days, plan of care for PT, was faxed 12/26/22 order# [id number]. Please have provider complete and faxed back to [phone].

## 2023-01-19 ENCOUNTER — Telehealth: Payer: Self-pay

## 2023-01-19 NOTE — Telephone Encounter (Signed)
Copied from CRM [id number]. Topic: Clinical - Home Health Verbal Orders >> Jan 16, 2023 10:40 AM Orinda Kenner C wrote: Caller/Agency: [facility] [phone] received fax, but there's no provider signature. Nicholos Johns needs to discuss pls c/b.

## 2023-01-19 NOTE — Telephone Encounter (Signed)
Spoke with [employer].  Issue has been resolved.

## 2023-01-20 ENCOUNTER — Other Ambulatory Visit: Payer: Self-pay | Admitting: Family Medicine

## 2023-01-20 DIAGNOSIS — G992 Myelopathy in diseases classified elsewhere: Secondary | ICD-10-CM

## 2023-02-20 ENCOUNTER — Telehealth: Payer: Self-pay | Admitting: Family Medicine

## 2023-02-20 ENCOUNTER — Ambulatory Visit: Payer: Self-pay | Admitting: Family Medicine

## 2023-02-20 DIAGNOSIS — H15102 Unspecified episcleritis, left eye: Secondary | ICD-10-CM

## 2023-02-20 DIAGNOSIS — M4802 Spinal stenosis, cervical region: Secondary | ICD-10-CM

## 2023-02-20 DIAGNOSIS — L299 Pruritus, unspecified: Secondary | ICD-10-CM

## 2023-02-20 DIAGNOSIS — B372 Candidiasis of skin and nail: Secondary | ICD-10-CM

## 2023-02-20 DIAGNOSIS — G43009 Migraine without aura, not intractable, without status migrainosus: Secondary | ICD-10-CM

## 2023-02-20 DIAGNOSIS — F32A Depression, unspecified: Secondary | ICD-10-CM

## 2023-02-20 NOTE — Telephone Encounter (Signed)
 Copied from CRM [id number]. Topic: Clinical - Medication Refill >> Feb 20, 2023  9:52 AM [name] wrote: Patient daughter [name] has called in to advise that the medication omeprazole  (PRILOSEC) 20 MG capsule needs to be sent to the pharmacy as well as all future medications:  [name] [address] [place], [place] [address] Phone: [phone]

## 2023-02-20 NOTE — Telephone Encounter (Signed)
 Called and spoke with patient.  Patient informed she had a change in insurance: she is now with [employer] and would like all prescriptions to be sent to her new pharmacy starting today.  New pharmacy will be:  [name] [address] [place], [place] [address] Phone: [phone] Pt stated she is not currently out of any of her medications at present time and is not actively having any issues that need to be triaged by nurse.  Nurse instructed patient to bring new insurance card into office at next visit and will request medications to be sent to new pharmacy.

## 2023-02-20 NOTE — Telephone Encounter (Signed)
 Requesting CHANGE OF PHARMACY to [facility] and rx rf of below medications:  Conjugated estrogens  cream- last written 10/09/22 Cyclosporine  0.05% opth sol - last written 11/26/21( dr. Alvan) Escitalopram  10mg  last written 12/22/2022 Nystatin  powder last written 11/26/2021 Rivastigmine  4.6mg /24 hr.last written by HISTORICAL PROVIDER Repatha  sureclick last written by ANOTHER PROVIDER =( tiffany [place]) Hydroxyzine  25mg  last written 11/22/2022 Omeprazole  20mg   last written 09/15/2022 ( dr. Alvan) Tramadol  50mg   lat written 01/20/2023

## 2023-02-20 NOTE — Telephone Encounter (Signed)
 Copied from CRM [id number]. Topic: Clinical - Prescription Issue >> Feb 20, 2023  9:50 AM [name] wrote: Reason for CRM: Patient daughter [name] has called in to advise that the medication omeprazole  (PRILOSEC) 20 MG capsule needs to be sent to the pharmacy as well as all future medications:  [name] [address] [place], [place] [address] Phone: [phone]

## 2023-02-20 NOTE — Telephone Encounter (Signed)
 Attempt made to contact pt: no answer   Answer Assessment - Initial Assessment Questions 1. REASON FOR CALL or QUESTION: What is your reason for calling today? or How can I best help you? or What question do you have that I can help answer?     Pt would like to know how to update pharmacy on file and request prescriptions be sent to new pharmacy moving forward.  Protocols used: Information Only Call - No Triage-A-AH

## 2023-02-20 NOTE — Telephone Encounter (Signed)
 Called pt today - please see nurse triage note 02/20/23

## 2023-02-21 MED ORDER — ESTROGENS CONJUGATED 0.625 MG/GM VA CREA: TOPICAL_CREAM | VAGINAL | 5 refills | Status: DC

## 2023-02-21 MED ORDER — OMEPRAZOLE 20 MG PO CPDR: 20.0000 mg | DELAYED_RELEASE_CAPSULE | Freq: Every day | ORAL | 1 refills | Status: DC

## 2023-02-21 MED ORDER — NYSTATIN 100000 UNIT/GM EX POWD: 1.0000 | Freq: Two times a day (BID) | CUTANEOUS | 2 refills | Status: AC | PRN

## 2023-02-21 MED ORDER — HYDROXYZINE PAMOATE 25 MG PO CAPS: 25.0000 mg | ORAL_CAPSULE | Freq: Two times a day (BID) | ORAL | 0 refills | Status: DC | PRN

## 2023-02-21 MED ORDER — UBRELVY 100 MG PO TABS: 100.0000 mg | ORAL_TABLET | ORAL | 11 refills | Status: DC | PRN

## 2023-02-21 MED ORDER — TRAMADOL HCL 50 MG PO TABS: 50.0000 mg | ORAL_TABLET | Freq: Two times a day (BID) | ORAL | 1 refills | Status: AC | PRN

## 2023-02-21 MED ORDER — ESCITALOPRAM OXALATE 10 MG PO TABS: 10.0000 mg | ORAL_TABLET | Freq: Every day | ORAL | 1 refills | Status: AC

## 2023-02-27 DIAGNOSIS — K080 Exfoliation of teeth due to systemic causes: Secondary | ICD-10-CM | POA: Diagnosis not present

## 2023-03-09 DIAGNOSIS — H903 Sensorineural hearing loss, bilateral: Secondary | ICD-10-CM | POA: Diagnosis not present

## 2023-03-09 DIAGNOSIS — J329 Chronic sinusitis, unspecified: Secondary | ICD-10-CM | POA: Diagnosis not present

## 2023-03-23 ENCOUNTER — Other Ambulatory Visit (HOSPITAL_COMMUNITY): Payer: Self-pay

## 2023-03-23 ENCOUNTER — Encounter: Payer: Self-pay | Admitting: Family Medicine

## 2023-03-23 ENCOUNTER — Telehealth: Payer: Self-pay | Admitting: Pharmacy Technician

## 2023-03-23 DIAGNOSIS — H52223 Regular astigmatism, bilateral: Secondary | ICD-10-CM | POA: Diagnosis not present

## 2023-03-23 DIAGNOSIS — H35372 Puckering of macula, left eye: Secondary | ICD-10-CM | POA: Diagnosis not present

## 2023-03-23 DIAGNOSIS — H40022 Open angle with borderline findings, high risk, left eye: Secondary | ICD-10-CM | POA: Diagnosis not present

## 2023-03-23 DIAGNOSIS — H40052 Ocular hypertension, left eye: Secondary | ICD-10-CM | POA: Diagnosis not present

## 2023-03-23 DIAGNOSIS — H524 Presbyopia: Secondary | ICD-10-CM | POA: Diagnosis not present

## 2023-03-23 DIAGNOSIS — H04123 Dry eye syndrome of bilateral lacrimal glands: Secondary | ICD-10-CM | POA: Diagnosis not present

## 2023-03-23 NOTE — Telephone Encounter (Signed)
Pharmacy Patient Advocate Encounter  Received notification from [facility] that Prior Authorization for repatha has been APPROVED from 03/23/23 to 03/22/24   PA #/Case ID/Reference #: [id number]

## 2023-03-23 NOTE — Telephone Encounter (Signed)
Pharmacy Patient Advocate Encounter   Received notification from CoverMyMeds that prior authorization for repatha is required/requested.   Insurance verification completed.   The patient is insured through [facility] .   Per test claim: PA required; PA submitted to above mentioned insurance via CoverMyMeds Key/confirmation #/[id number] Status is pending

## 2023-03-27 ENCOUNTER — Other Ambulatory Visit: Payer: Self-pay | Admitting: Cardiovascular Disease

## 2023-03-27 DIAGNOSIS — J342 Deviated nasal septum: Secondary | ICD-10-CM | POA: Diagnosis not present

## 2023-03-27 DIAGNOSIS — J329 Chronic sinusitis, unspecified: Secondary | ICD-10-CM | POA: Diagnosis not present

## 2023-03-29 ENCOUNTER — Other Ambulatory Visit: Payer: Self-pay | Admitting: Family Medicine

## 2023-03-29 DIAGNOSIS — Z1231 Encounter for screening mammogram for malignant neoplasm of breast: Secondary | ICD-10-CM

## 2023-04-04 ENCOUNTER — Ambulatory Visit (INDEPENDENT_AMBULATORY_CARE_PROVIDER_SITE_OTHER): Payer: PPO | Admitting: Sports Medicine

## 2023-04-04 ENCOUNTER — Encounter: Payer: Self-pay | Admitting: Sports Medicine

## 2023-04-04 VITALS — BP 116/62 | HR 80 | Temp 96.9°F | Resp 16 | Ht 65.0 in | Wt 177.8 lb

## 2023-04-04 DIAGNOSIS — R413 Other amnesia: Secondary | ICD-10-CM | POA: Diagnosis not present

## 2023-04-04 DIAGNOSIS — J329 Chronic sinusitis, unspecified: Secondary | ICD-10-CM | POA: Diagnosis not present

## 2023-04-04 NOTE — Progress Notes (Signed)
 Careteam: Patient Care Team: Charlton Amor, DO as PCP - General (Family Medicine) Hilarie Fredrickson, MD as Attending Physician (Gastroenterology) Odette Fraction, MD (Inactive) as Attending Physician (Pain Medicine) Nelson Chimes, MD as Consulting Physician (Ophthalmology) Gabriel Carina, [facility] (Inactive) as Pharmacist (Pharmacist) Janalyn Harder, MD (Inactive) as Consulting Physician (Dermatology)  PLACE OF SERVICE:  [facility]  Advanced Directive information Does Patient Have a Medical Advance Directive?: Yes, Type of Advance Directive: Healthcare Power of [place];Living will, Does patient want to make changes to medical advance directive?: No - Patient declined  Allergies  Allergen Reactions   Celebrex [Celecoxib] Itching, Nausea Only and Other (See Comments)    Gas, hair loss also   Fluoxetine Hcl Shortness Of Breath and Itching   Fluoxetine Hcl Itching and Shortness Of Breath    REACTION: itching,SOB  REACTION: itching,SOB   Ambien [Zolpidem] Other (See Comments)    Headaches    Amitriptyline Other (See Comments)    Dry mouth    Aricept [Donepezil Hcl] Other (See Comments)    Nightmares    Ciprofloxacin Other (See Comments)    Muscle weakness   Dexamethasone Diarrhea and Nausea And Vomiting   Iodinated Contrast Media Itching and Other (See Comments)    MultiHance (probably not "Multilance")  Multilance.   Livalo [Pitavastatin] Other (See Comments)    Myalgia    Metrizamide Itching and Other (See Comments)    AMIPAQUE (also allergic to MultiHance (probably not "Multilance")   Nitroglycerin Nausea Only and Other (See Comments)    Dizziness and sweating, also   Rosuvastatin Other (See Comments)    Body aches and cramping   Simvastatin Other (See Comments)    Muscle aches and headaches   Statins Other (See Comments)    Could NOT tolerate- "Made me feel like I had the flu"   Codeine Sulfate Itching    REACTION: itching  REACTION: itching   Sulfonamide Derivatives  Nausea Only    REACTION: Nausea    Chief Complaint  Patient presents with   Establish Care    New patient.      Discussed the use of AI scribe software for clinical note transcription with the patient, who gave verbal consent to proceed.  History of Present Illness   Cheryl [name] is an 83 year old female who is here for new patient visit. She is accompanied by her daughter, who assists with her care     Memory deficits  Memory issues began around the time of the COVID-19 shutdown, approximately two years ago. She has difficulty remembering names, finding words, and sometimes repeats questions. She stopped driving a few months ago due to memory-related issues. Her daughter notes trouble with tasks involving multiple steps and spatial recognition issues, such as getting too close to others in a grocery store. She has a family history of dementia, as her mother had the condition. 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 She has been taking rivastigmine for nearly two years, after she could not tolerate aricept.  She experiences occasional feelings of depression, particularly in situations she regrets. She takes Lexapro for mood management and describes her mood as generally okay. Her appetite is good, although she notes that food tastes different with age. She has experienced some weight loss, dropping from 180 pounds to 177 pounds since September.  Her sleep pattern involves going to bed early in the evening, around 4 PM, but she does not  fall asleep immediately. She sleeps until 6 or 7 AM but has felt tired upon waking for the past week.  She experiences chronic back pain, for which she takes tramadol. The pain is exacerbated by bending over and prolonged walking, which also causes back discomfort. She can walk for about 15-20 minutes without stopping. She has experienced vertigo and drinks a lot of water  daily. No falls in the last six months, but she describes her balance as 'iffy'.         Review of Systems:  Review of Systems  Constitutional:  Negative for chills and fever.  HENT:  Negative for congestion and sore throat.   Eyes:  Negative for double vision.  Respiratory:  Negative for cough, sputum production and shortness of breath.   Cardiovascular:  Negative for chest pain, palpitations and leg swelling.  Gastrointestinal:  Negative for abdominal pain, heartburn and nausea.  Genitourinary:  Negative for dysuria, frequency and hematuria.  Musculoskeletal:  Positive for back pain. Negative for myalgias.  Neurological:  Negative for dizziness.  Psychiatric/Behavioral:  Positive for memory loss.    Negative unless indicated in HPI.   Past Medical History:  Diagnosis Date   Anemia    Anxiety    d/t recent family issues   Arthritis    Bone spur    Carotid stenosis, bilateral 06/06/2019   Chronic back pain    stenosis and OA   Dementia (HCC)    Depression    takes Paxil daily   Diverticulosis    Dizzy    occasionally   Encephalitis    at 9 months ago    Fatty liver    has had 2 Hep shots and the final one is in June 18   GERD (gastroesophageal reflux disease)    takes Omeprazole daily   Hepatitis 2012   History of bronchitis    20-25 yrs ago   History of colon polyps    precancerous   History of shingles    Hyperlipidemia    takes Simvastatin daily   IBS (irritable bowel syndrome)    more on side of constipation-takes Miralax daily   Insomnia    takes Melatonin and Ambien nightly   Internal hemorrhoids    Joint pain    Joint swelling    Lower leg edema 09/21/2021   Lung nodules    calcified    Migraine headache    migraines-thinks coming from neck.Last one1/28/18   Orthostatic hypotension 09/21/2021   Pneumonia    hx of-20 yrs ago-=-walking   Renal cyst    left   Uterine cancer (HCC) 1973   Past Surgical History:  Procedure Laterality Date    APPENDECTOMY  1966   cataracts     bilateral   COLONOSCOPY  09/04/2020   EUS  02/10/2011   Procedure: UPPER ENDOSCOPIC ULTRASOUND (EUS) RADIAL;  Surgeon: Rob Bunting, MD;  Location: [facility];  Service: Endoscopy;  Laterality: N/A;  radial linear    LUMBAR FUSION  03/2016   L4-5, Dr. Sampson Goon.   RECTOCELE REPAIR  2011   ROTATOR CUFF REPAIR  2009   left   TONSILLECTOMY     UPPER GASTROINTESTINAL ENDOSCOPY     VAGINAL HYSTERECTOMY  1973   Social History:   reports that she quit smoking about 30 years ago. Her smoking use included cigarettes. She has never used smokeless tobacco. She reports that she does not drink alcohol and does not use drugs.  Family History  Problem Relation Age  of Onset   Kidney disease Mother    Kidney failure Mother        med induced   Rheum arthritis Mother    Heart disease Father    Alcohol abuse Father    Lung cancer Father        smoker   Depression Brother    Crohn's disease Brother    Alcohol abuse Brother    Heart failure Daughter    Lung cancer Son    HIV Son    Heart disease Other        Paternal family    Malignant hyperthermia Neg Hx    Colon cancer Neg Hx    Esophageal cancer Neg Hx    Rectal cancer Neg Hx    Stomach cancer Neg Hx    Migraines Neg Hx     Medications: Patient's Medications  New Prescriptions   No medications on file  Previous Medications   ASPIRIN 81 MG TABLET    Take 81 mg by mouth daily.   CHOLECALCIFEROL (VITAMIN D) 1000 UNITS CAPSULE    Take 2,000 Units by mouth daily.   CYCLOSPORINE (RESTASIS) 0.05 % OPHTHALMIC EMULSION    Place 1 drop into both eyes 2 (two) times daily.   ESCITALOPRAM (LEXAPRO) 10 MG TABLET    Take 1 tablet (10 mg total) by mouth daily.   EVOLOCUMAB (REPATHA SURECLICK) 140 MG/ML SOAJ    INJECT 140 MG INTO THE SKIN EVERY 14 (FOURTEEN) DAYS.   EXCEDRIN MIGRAINE 250-250-65 MG TABLET    Take 2 tablets by mouth every 6 (six) hours as needed for migraine or headache.   HYDROXYZINE  (VISTARIL) 25 MG CAPSULE    Take 1 capsule (25 mg total) by mouth 2 (two) times daily as needed.   MAGNESIUM PO    Take 250 mg by mouth daily.   MULTIPLE VITAMIN (MULTI-VITAMIN) TABLET    Take 1 tablet by mouth daily.   NYSTATIN (MYCOSTATIN/NYSTOP) POWDER    Apply 1 Application topically 2 (two) times daily as needed.   OMEGA-3 FATTY ACIDS (FISH OIL PO)    Take 2,400 mg by mouth daily. Softgel   OMEPRAZOLE (PRILOSEC) 20 MG CAPSULE    Take 20 mg by mouth in the morning and at bedtime.   POLYETHYLENE GLYCOL (MIRALAX / GLYCOLAX) PACKET    Take 17 g by mouth daily.   PROBIOTIC TABS    Take 1 tablet by mouth daily.   TRAMADOL (ULTRAM) 50 MG TABLET    Take 1 tablet (50 mg total) by mouth 2 (two) times daily as needed. for pain   TURMERIC PO    Take 2 Pieces by mouth daily. 500mg  gummies  Modified Medications   No medications on file  Discontinued Medications   AMBULATORY NON FORMULARY MEDICATION    Take 1 capsule by mouth daily. Medication Name: HYPER BIOTIC PROBIOTICS FOR WOMEN   CONJUGATED ESTROGENS (PREMARIN) VAGINAL CREAM    1 pea sized application to urethra and vaginal opening nightly x 10 days and then 3 x a week, thereafter.   OMEPRAZOLE (PRILOSEC) 20 MG CAPSULE    Take 1 capsule (20 mg total) by mouth daily before breakfast.   RIVASTIGMINE (EXELON) 4.6 MG/24HR    Place 4.6 mg onto the skin daily.   TURMERIC (CURCUPLEX-95) 500 MG CAPS    Take 1 capsule by mouth daily.   UBROGEPANT (UBRELVY) 100 MG TABS    Take 1 tablet (100 mg total) by mouth every 2 (two) hours as  needed. Maximum 200mg  a day.    Physical Exam: Vitals:   04/04/23 0922  BP: 116/62  Pulse: 80  Resp: 16  Temp: (!) 96.9 F (36.1 C)  SpO2: 94%  Weight: 177 lb 12.8 oz (80.6 kg)  Height: 5\' 5"  (1.651 m)   Body mass index is 29.59 kg/m. BP Readings from Last 3 Encounters:  04/04/23 116/62  11/15/22 (!) 113/55  10/20/22 112/62   Wt Readings from Last 3 Encounters:  04/04/23 177 lb 12.8 oz (80.6 kg)  11/15/22 177 lb  4 oz (80.4 kg)  10/20/22 178 lb 8 oz (81 kg)    Physical Exam Constitutional:      Appearance: Normal appearance.  HENT:     Head: Normocephalic and atraumatic.  Cardiovascular:     Rate and Rhythm: Normal rate and regular rhythm.  Pulmonary:     Effort: Pulmonary effort is normal. No respiratory distress.     Breath sounds: Normal breath sounds. No wheezing.  Abdominal:     General: Bowel sounds are normal. There is no distension.     Tenderness: There is no abdominal tenderness. There is no guarding or rebound.     Comments:    Musculoskeletal:        General: No swelling.  Neurological:     Mental Status: She is alert. Mental status is at baseline.     Motor: No weakness.     Labs reviewed: Basic Metabolic Panel: Recent Labs    08/01/22 1138 10/13/22 0917 11/14/22 0835  NA 138 140 140  K 5.1 4.9 4.3  CL 102 103 103  CO2 26 23 23   GLUCOSE 95 107* 120*  BUN 17 13 17   CREATININE 1.08* 1.08* 0.96  CALCIUM 9.8 10.0 10.3  TSH  --  3.340  --    Liver Function Tests: Recent Labs    07/26/22 0850 08/01/22 1138 10/13/22 0917 11/14/22 0835  AST 73* 67* 69* 57*  ALT 50* 55* 52* 51*  ALKPHOS  --   --  80 80  BILITOT 0.6  --  0.5 0.5  PROT 6.5  --  6.6 6.6  ALBUMIN  --   --  4.2 4.3   No results for input(s): "LIPASE", "AMYLASE" in the last 8760 hours. No results for input(s): "AMMONIA" in the last 8760 hours. CBC: Recent Labs    07/26/22 0850  WBC 5.2  NEUTROABS 2,527  HGB 14.2  HCT 43.4  MCV 85.8  PLT 249   Lipid Panel: Recent Labs    07/26/22 0850 11/14/22 0835  CHOL 155 158  HDL 47* 40  LDLCALC 82 91  TRIG 164* 157*  CHOLHDL 3.3 4.0   TSH: Recent Labs    10/13/22 0917  TSH 3.340   A1C: Lab Results  Component Value Date   HGBA1C 5.8 (H) 11/14/2022    Assessment and Plan    Alzheimer's and Vascular Dementia Memory issues ongoing for approximately two years, with difficulty in executive functioning, multi-step tasks, and spatial  recognition. Currently on Rivastigmine for two years with some tolerance issues with previous medications. Family history of dementia noted. -Consider increasing Rivastigmine to 9.5mg    chronic sinusitis Patient reports ongoing sinus issues, currently under evaluation by ENT with recent scan. -Follow up on ENT evaluation and scan results.   Daughter thought this visit is more of a consultation rather than establishing care Informed patient and her daughter unfortunately we do not offer consultation and offer only primary care services.

## 2023-04-24 ENCOUNTER — Encounter: Payer: Self-pay | Admitting: Family Medicine

## 2023-04-24 ENCOUNTER — Ambulatory Visit (INDEPENDENT_AMBULATORY_CARE_PROVIDER_SITE_OTHER): Admitting: Family Medicine

## 2023-04-24 ENCOUNTER — Ambulatory Visit (INDEPENDENT_AMBULATORY_CARE_PROVIDER_SITE_OTHER)

## 2023-04-24 VITALS — BP 117/65 | HR 74 | Temp 98.5°F | Resp 18 | Ht 65.0 in | Wt 177.1 lb

## 2023-04-24 DIAGNOSIS — R053 Chronic cough: Secondary | ICD-10-CM

## 2023-04-24 DIAGNOSIS — I700 Atherosclerosis of aorta: Secondary | ICD-10-CM | POA: Diagnosis not present

## 2023-04-24 DIAGNOSIS — J42 Unspecified chronic bronchitis: Secondary | ICD-10-CM | POA: Diagnosis not present

## 2023-04-24 DIAGNOSIS — R062 Wheezing: Secondary | ICD-10-CM

## 2023-04-24 MED ORDER — ALBUTEROL SULFATE HFA 108 (90 BASE) MCG/ACT IN AERS: 2.0000 | INHALATION_SPRAY | Freq: Four times a day (QID) | RESPIRATORY_TRACT | 0 refills | Status: AC | PRN

## 2023-04-24 NOTE — Progress Notes (Addendum)
 Acute Office Visit  Subjective:     Patient ID: Cheryl [name], female    DOB: [birth year], 83 y.o.   MRN: [id number]  Chief Complaint  Patient presents with   Wheezing    HPI Patient is in today for wheezing, being treated for sinus infection per ENT.  Clindamycin for 10 days and prednisone (not taking prednisone).  Completed Augmentin before changing to Clindamycin. Wheezing present for 2 days.  Endorses cough present for months.  Endorses shortness of breath, daughter does not endorse this.  There is no obvious shortness of breath in office.  Does not need to stop to speak to provider today. Daughter has been exposed to pneumonia. Patient has no known exposures to pneumonia.  No fever or chills. Ongoing sinus congestion (on second antibiotic for this)  Chart review:  04/19/23: ENT: CT sinus for pansinusitis Augmentin not improved Clindamycin 300 mg TID for 10 days (still taking) + pred taper over 9 days.    Review of Systems  Constitutional:  Negative for chills and fever.  HENT:  Positive for sinus pain (months, treated with clindamycin per ENT). Negative for ear pain.   Respiratory:  Positive for cough, shortness of breath (per patient report, daughter has not noticed this.) and wheezing.   Gastrointestinal:  Negative for abdominal pain, nausea and vomiting.  Neurological:  Positive for headaches (not new).        Objective:    BP 117/65   Pulse 74   Temp 98.5 F (36.9 C) (Oral)   Resp 18   Ht 5\' 5"  (1.651 m)   Wt 177 lb 1.6 oz (80.3 kg)   SpO2 94%   BMI 29.47 kg/m  BP Readings from Last 3 Encounters:  04/24/23 117/65  04/04/23 116/62  11/15/22 (!) 113/55      Physical Exam Vitals and nursing note reviewed.  Constitutional:      General: She is not in acute distress.    Appearance: Normal appearance.  Pulmonary:     Effort: Pulmonary effort is normal.     Breath sounds: Wheezing (occassional) present.  Skin:    General: Skin is warm and dry.   Neurological:     General: No focal deficit present.     Mental Status: She is alert. Mental status is at baseline.  Psychiatric:        Mood and Affect: Mood normal.        Behavior: Behavior normal.        Thought Content: Thought content normal.        Judgment: Judgment normal.    No results found for any visits on 04/24/23.      Assessment & Plan:   Problem List Items Addressed This Visit     Wheezing - Primary   Breath sounds present throughout with occasional wheezing heard.  No shortness of breath. No fever. Oxygen saturation 94% today.  Cough present for months. Nasal congestion present for months. Likely upper respiratory viral illness on top of current sinus issues. Stat chest x-ray to rule out pneumonia.  Albuterol 108 mcg inhaler 2 inhalations every six hours as needed for shortness of breath and/or wheezing.  Currently taking clindamycin per ENT for sinusitis.  Did not start prednisone as per ENT.  Follow-up based on chest x-ray results.       Relevant Medications   albuterol (VENTOLIN HFA) 108 (90 Base) MCG/ACT inhaler   Other Relevant Orders   DG Chest 2 View    Meds  ordered this encounter  Medications   albuterol (VENTOLIN HFA) 108 (90 Base) MCG/ACT inhaler    Sig: Inhale 2 puffs into the lungs every 6 (six) hours as needed for wheezing or shortness of breath.    Dispense:  8 g    Refill:  0    Supervising Provider:   Suzan Slick [[id number]]  Agrees with plan of care discussed.  Questions answered.   Return if symptoms worsen or fail to improve.  Novella Olive, FNP

## 2023-04-24 NOTE — Assessment & Plan Note (Addendum)
 Breath sounds present throughout with occasional wheezing heard.  No shortness of breath. No fever. Oxygen saturation 94% today.  Cough present for months. Nasal congestion present for months. Likely upper respiratory viral illness on top of current sinus issues. Stat chest x-ray to rule out pneumonia.  Albuterol 108 mcg inhaler 2 inhalations every six hours as needed for shortness of breath and/or wheezing.  Currently taking clindamycin per ENT for sinusitis.  Did not start prednisone as per ENT.  Follow-up based on chest x-ray results.

## 2023-04-28 DIAGNOSIS — H40052 Ocular hypertension, left eye: Secondary | ICD-10-CM | POA: Diagnosis not present

## 2023-04-28 DIAGNOSIS — H40022 Open angle with borderline findings, high risk, left eye: Secondary | ICD-10-CM | POA: Diagnosis not present

## 2023-05-09 ENCOUNTER — Other Ambulatory Visit: Payer: Self-pay | Admitting: Family Medicine

## 2023-05-09 DIAGNOSIS — L299 Pruritus, unspecified: Secondary | ICD-10-CM

## 2023-05-09 DIAGNOSIS — J42 Unspecified chronic bronchitis: Secondary | ICD-10-CM | POA: Insufficient documentation

## 2023-05-11 ENCOUNTER — Telehealth: Payer: Self-pay

## 2023-05-11 ENCOUNTER — Other Ambulatory Visit (HOSPITAL_COMMUNITY): Payer: Self-pay

## 2023-05-11 MED ORDER — HYDROXYZINE PAMOATE 25 MG PO CAPS: 25.0000 mg | ORAL_CAPSULE | Freq: Two times a day (BID) | ORAL | 0 refills | Status: DC | PRN

## 2023-05-11 NOTE — Telephone Encounter (Signed)
 Copied from CRM [id number]. Topic: Clinical - Medication Question >> May 11, 2023 12:33 PM Suzette B wrote: Reason for CRM: hydrOXYzine (VISTARIL) 25 MG capsule, has the prescriber that the risk and side effect of this requested high risk medication been discussed with the patient. It is very important that someone calls before end of day in regards to this question. Ms. [name] stated if they don't hear from anyone by end of day they will issue an Administrative Denial on the medication.

## 2023-05-11 NOTE — Telephone Encounter (Signed)
 Forwarding message to DR. Metheney covering Dr. Tamera Punt

## 2023-05-11 NOTE — Telephone Encounter (Signed)
 Pharmacy Patient Advocate Encounter   Received notification from Patient Pharmacy that prior authorization for Hydroxyzine pam 25 is required/requested.   Insurance verification completed.   The patient is insured through [employer]  .   Per test claim:This med is high risk in the elderly, please consider an alternative. Key: [id number] Status pending

## 2023-05-11 NOTE — Telephone Encounter (Signed)
 Kim, I am not really sure what they are asking are they just asking if we are aware and were still okay to prescribe it?  If that is the question then I would say yes for them to go ahead and fill it.

## 2023-05-11 NOTE — Telephone Encounter (Signed)
 Forwarding message to dr. Linford Arnold covering dr. Tamera Punt.

## 2023-05-12 ENCOUNTER — Telehealth: Payer: Self-pay | Admitting: Family Medicine

## 2023-05-12 NOTE — Telephone Encounter (Signed)
 No information on message as to who is calling -  checked with pharmacy and they did not make the call but stated that this was likely a prior authorization question - forwarding to our PA team for review.

## 2023-05-12 NOTE — Telephone Encounter (Signed)
 Pharmacy Patient Advocate Encounter  Received notification from [facility] that Prior Authorization for Hydroxyzine 25 has been DENIED.  No reason given; No denial letter received via Fax or CMM. It has been requested and will be uploaded to the media tab once received.   PA #/Case ID/Reference #: [id number]

## 2023-05-12 NOTE — Telephone Encounter (Signed)
 Copied from CRM [id number]. Topic: Clinical - Prescription Issue >> May 12, 2023  2:36 PM Carrielelia G wrote: Reason for CRM: Lorelle Formosa with [facility] calling in regards to the medication hydrOXYzine (VISTARIL) 25 MG capsule,  it was denied due to criteria of prior auth not being met.  Rath will also fax this information over too.

## 2023-05-12 NOTE — Telephone Encounter (Signed)
 Message showing medication was denied.  Should appeal be made for this medicaiton?

## 2023-05-15 DIAGNOSIS — F418 Other specified anxiety disorders: Secondary | ICD-10-CM | POA: Diagnosis not present

## 2023-05-15 DIAGNOSIS — K760 Fatty (change of) liver, not elsewhere classified: Secondary | ICD-10-CM | POA: Diagnosis not present

## 2023-05-15 DIAGNOSIS — E785 Hyperlipidemia, unspecified: Secondary | ICD-10-CM | POA: Diagnosis not present

## 2023-05-15 DIAGNOSIS — F0390 Unspecified dementia without behavioral disturbance: Secondary | ICD-10-CM | POA: Diagnosis not present

## 2023-05-15 NOTE — Telephone Encounter (Signed)
 We can do an appeal or she could even just check on the cash price at her pharmacy.  It is generic so it usually pretty cheap we can do an appeal or she could even just check on the cash price at her pharmacy.  It is generic so it usually pretty cheap.  Sometimes if it is reasonable to have people just bypassed the Medicare and just pay for it out-of-pocket.  It can cause excess sedation dry mouth and issues with urination which is why it is on the list of medicines that they do not want to cover in people over 65.

## 2023-05-16 NOTE — Telephone Encounter (Signed)
Left message advising of recommendations.  

## 2023-05-18 ENCOUNTER — Ambulatory Visit: Payer: Self-pay | Admitting: *Deleted

## 2023-05-23 ENCOUNTER — Other Ambulatory Visit: Payer: Self-pay | Admitting: Family Medicine

## 2023-05-23 DIAGNOSIS — R0602 Shortness of breath: Secondary | ICD-10-CM

## 2023-05-31 DIAGNOSIS — Z0000 Encounter for general adult medical examination without abnormal findings: Secondary | ICD-10-CM | POA: Diagnosis not present

## 2023-05-31 DIAGNOSIS — K760 Fatty (change of) liver, not elsewhere classified: Secondary | ICD-10-CM | POA: Diagnosis not present

## 2023-05-31 DIAGNOSIS — E785 Hyperlipidemia, unspecified: Secondary | ICD-10-CM | POA: Diagnosis not present

## 2023-06-05 DIAGNOSIS — K219 Gastro-esophageal reflux disease without esophagitis: Secondary | ICD-10-CM | POA: Diagnosis not present

## 2023-06-05 DIAGNOSIS — E785 Hyperlipidemia, unspecified: Secondary | ICD-10-CM | POA: Diagnosis not present

## 2023-06-05 DIAGNOSIS — R062 Wheezing: Secondary | ICD-10-CM | POA: Diagnosis not present

## 2023-06-05 DIAGNOSIS — Z0000 Encounter for general adult medical examination without abnormal findings: Secondary | ICD-10-CM | POA: Diagnosis not present

## 2023-06-05 DIAGNOSIS — F418 Other specified anxiety disorders: Secondary | ICD-10-CM | POA: Diagnosis not present

## 2023-06-14 ENCOUNTER — Other Ambulatory Visit

## 2023-06-14 ENCOUNTER — Ambulatory Visit
Admission: RE | Admit: 2023-06-14 | Discharge: 2023-06-14 | Disposition: A | Discharge status: Home / Self Care | Source: Ambulatory Visit | Attending: Family Medicine | Admitting: Family Medicine

## 2023-06-14 DIAGNOSIS — R0602 Shortness of breath: Secondary | ICD-10-CM | POA: Diagnosis not present

## 2023-06-27 ENCOUNTER — Encounter: Payer: Self-pay | Admitting: Radiology

## 2023-06-27 ENCOUNTER — Ambulatory Visit
Admission: RE | Admit: 2023-06-27 | Discharge: 2023-06-27 | Disposition: A | Discharge status: Home / Self Care | Payer: PPO | Source: Ambulatory Visit | Attending: Family Medicine | Admitting: Family Medicine

## 2023-06-27 ENCOUNTER — Ambulatory Visit: Payer: PPO

## 2023-06-27 ENCOUNTER — Other Ambulatory Visit: Payer: Self-pay | Admitting: Family Medicine

## 2023-06-27 DIAGNOSIS — R7401 Elevation of levels of liver transaminase levels: Secondary | ICD-10-CM

## 2023-06-27 DIAGNOSIS — M545 Low back pain, unspecified: Secondary | ICD-10-CM

## 2023-06-27 DIAGNOSIS — R2689 Other abnormalities of gait and mobility: Secondary | ICD-10-CM

## 2023-06-27 DIAGNOSIS — N958 Other specified menopausal and perimenopausal disorders: Secondary | ICD-10-CM | POA: Diagnosis not present

## 2023-06-27 DIAGNOSIS — M8588 Other specified disorders of bone density and structure, other site: Secondary | ICD-10-CM | POA: Diagnosis not present

## 2023-06-27 DIAGNOSIS — Z780 Asymptomatic menopausal state: Secondary | ICD-10-CM

## 2023-06-29 ENCOUNTER — Ambulatory Visit: Admitting: Internal Medicine

## 2023-07-07 ENCOUNTER — Ambulatory Visit (INDEPENDENT_AMBULATORY_CARE_PROVIDER_SITE_OTHER): Payer: PPO

## 2023-07-07 DIAGNOSIS — I6523 Occlusion and stenosis of bilateral carotid arteries: Secondary | ICD-10-CM

## 2023-07-11 ENCOUNTER — Ambulatory Visit: Payer: Self-pay | Admitting: Cardiovascular Disease

## 2023-07-20 ENCOUNTER — Encounter (HOSPITAL_BASED_OUTPATIENT_CLINIC_OR_DEPARTMENT_OTHER): Payer: Self-pay

## 2023-07-21 ENCOUNTER — Encounter (HOSPITAL_BASED_OUTPATIENT_CLINIC_OR_DEPARTMENT_OTHER): Payer: Self-pay | Admitting: Cardiovascular Disease

## 2023-07-21 ENCOUNTER — Ambulatory Visit (HOSPITAL_BASED_OUTPATIENT_CLINIC_OR_DEPARTMENT_OTHER): Payer: PPO | Admitting: Cardiovascular Disease

## 2023-07-21 ENCOUNTER — Other Ambulatory Visit (HOSPITAL_COMMUNITY): Payer: Self-pay

## 2023-07-21 ENCOUNTER — Telehealth: Payer: Self-pay | Admitting: Pharmacy Technician

## 2023-07-21 VITALS — BP 96/54 | HR 66 | Ht 63.0 in | Wt 173.6 lb

## 2023-07-21 DIAGNOSIS — E7800 Pure hypercholesterolemia, unspecified: Secondary | ICD-10-CM | POA: Diagnosis not present

## 2023-07-21 DIAGNOSIS — I6523 Occlusion and stenosis of bilateral carotid arteries: Secondary | ICD-10-CM

## 2023-07-21 DIAGNOSIS — I951 Orthostatic hypotension: Secondary | ICD-10-CM

## 2023-07-21 MED ORDER — REPATHA SURECLICK 140 MG/ML ~~LOC~~ SOAJ: 140.0000 mg | SUBCUTANEOUS | 3 refills | Status: AC

## 2023-07-21 NOTE — Progress Notes (Signed)
 Cardiology Office Note:  .   Date:  07/21/2023  ID:  Cheryl [name], DOB 05-Oct-1940, MRN [id number] PCP: Karlton Overly, DO  [facility] Providers Cardiologist:  None    History of Present Illness: .    Cheryl [name] is a 83 y.o. female with hyperlipidemia, fatty liver disease, and mild carotid stenosis who presents for follow up.  She was first seen 04/2015 with a report of shortness of breath.  She had an echo 05/28/15 that revealed LVEF 55-60% with grade 1 diastolic dysfunction. She also had a Lexiscan  Myoview  05/07/15 that revealed LVEF 60% with a small, fixed septal/anteroseptal defect that was felt to be artifact. There is no evidence of ischemia.  She has struggled with orthostatic hypotension and was instructed to wear compression stockings increase her salt intake. She underwent L4/L5 decompression surgery on 03/2016.  She then struggled to breathe due to her back brace.     She reported exertional dyspnea.  She was referred for a Lexiscan  Myoview  5/202 that revealed LVEF 69% with no ischemia.  Carotid Dopplers still revealed mild stenosis bilaterally.  She noted some heart racing.  She wore an ambulatory monitor that revealed some PACs and 4 beats of SVT.  At her visit 09/2021 she continued to note some palpitations, especially when lying down at night.  She was also struggling with vertigo intermittently.  At her visit 10/2022 she was recovering from COVID-19 but otherwise stable.  She was encouraged to increase her exercise.  Carotid Dopplers 06/2023 revealed stable 1-39% stenosis bilaterally.  Discussed the use of AI scribe software for clinical note transcription with the patient, who gave verbal consent to proceed.  History of Present Illness  Cheryl [name] has been experiencing a decline in memory, describing it as 'going downhill' and feeling 'pretty bad confused'. She is currently under the care of a neurologist at [facility] in [place] but plans to switch to Dr. Tresia Fruit due to  travel concerns. Her medication, Revastid, is being increased from 4.5 to 9.5 mg, though she is unsure of its effectiveness.  She experiences balance issues and lacks confidence walking alone, although she has not had any falls. No chest pain or pressure is noted while walking around the house.  Her blood pressure has been lower than usual, in the nineties, but she does not feel lightheaded. She has a history of low blood pressure.  Her appetite is variable, typically eating cereal for breakfast and a final meal at lunch, not eating at night. Her weight has decreased from 185 lbs a year ago to 177 lbs currently. She sleeps from about 5 PM to 6:30 AM, totaling approximately 12 hours of sleep.  She experiences tingling in her feet but no swelling in her legs or feet.  Her cholesterol levels were checked in April and were excellent, with an LDL of 63. She is on Repatha  injections every two weeks and manages her schedule with notes and a calendar. She also takes aspirin as part of her cardiac medications.  She has a history of bronchitis and is planning to see a pulmonologist for ongoing respiratory issues. She reports coughing up sputum last night and at lunch yesterday.  ROS:  As per HPI  Studies Reviewed: .       Carotid Doppler 06/2023:   Summary:  Right Carotid: Velocities in the right ICA are consistent with a stable  1-39%                stenosis.  Left         Velocities in the left ICA are consistent with a stable 1-39%  Carotid:     stenosis.   Vertebrals:  Bilateral vertebral arteries demonstrate antegrade flow.  Subclavians: Normal flow hemodynamics were seen in bilateral subclavian               arteries.   *See table(s) above for measurements and observations.   Suggest follow up study in 12 months.   Risk Assessment/Calculations:             Physical Exam:   VS:  BP (!) 96/54 (BP Location: Left Arm, Patient Position: Sitting, Cuff Size: Normal)   Pulse 66   Ht  5' 3 (1.6 m)   Wt 173 lb 9.6 oz (78.7 kg)   SpO2 95%   BMI 30.75 kg/m  , BMI Body mass index is 30.75 kg/m. GENERAL:  Well appearing HEENT: Pupils equal round and reactive, fundi not visualized, oral mucosa unremarkable NECK:  No jugular venous distention, waveform within normal limits, carotid upstroke brisk and symmetric, no bruits, no thyromegaly LUNGS:  Clear to auscultation bilaterally HEART:  RRR.  PMI not displaced or sustained,S1 and S2 within normal limits, no S3, no S4, no clicks, no rubs, no murmurs ABD:  Flat, positive bowel sounds normal in frequency in pitch, no bruits, no rebound, no guarding, no midline pulsatile mass, no hepatomegaly, no splenomegaly EXT:  2 plus pulses throughout, no edema, no cyanosis no clubbing SKIN:  No rashes no nodules NEURO:  Cranial nerves II through XII grossly intact, motor grossly intact throughout PSYCH:  Cognitively intact, oriented to person place and time   ASSESSMENT AND PLAN: .    Assessment & Plan  # Dementia: Memory decline progressing. Neurologist adjusting medication to slow progression. Patient coping well, trusts neurologist for management.  Good family support.  - Continue medication regimen as adjusted by neurologist. - Follow up with neurologist Dr. Tresia Fruit for management.  # Carotid stenosis: # Hyperlipidemia;  Lipids are well-managed.  Disease stable.  Repeat carotids in 2 years.  Continue aspirin and Repatha .   # Hypotension Blood pressure in 90s, asymptomatic. - Monitor blood pressure and symptoms. - Increase salt intake if lightheadedness occurs.      Dispo: f/u 1 year  Signed, Maudine Sos, MD

## 2023-07-21 NOTE — Telephone Encounter (Signed)
 [place] in [facility]  06/28

## 2023-07-21 NOTE — Patient Instructions (Signed)
 Medication Instructions:  Your physician recommends that you continue on your current medications as directed. Please refer to the Current Medication list given to you today.   *If you need a refill on your cardiac medications before your next appointment, please call your pharmacy*  Lab Work: NONE  Testing/Procedures: NONE  Follow-Up: At [facility], you and your health needs are our priority.  As part of our continuing mission to provide you with exceptional heart care, our providers are all part of one team.  This team includes your primary Cardiologist (physician) and Advanced Practice Providers or APPs (Physician Assistants and Nurse Practitioners) who all work together to provide you with the care you need, when you need it.  Your next appointment:   12 month(s)  Provider:   Chilton Si, MD, Eligha Bridegroom, NP, or Gillian Shields, NP    We recommend signing up for the patient portal called "MyChart".  Sign up information is provided on this After Visit Summary.  MyChart is used to connect with patients for Virtual Visits (Telemedicine).  Patients are able to view lab/test results, encounter notes, upcoming appointments, etc.  Non-urgent messages can be sent to your provider as well.   To learn more about what you can do with MyChart, go to [email or web address].

## 2023-08-03 ENCOUNTER — Ambulatory Visit: Admitting: Pulmonary Disease

## 2023-08-04 ENCOUNTER — Encounter: Payer: Self-pay | Admitting: Pulmonary Disease

## 2023-08-04 ENCOUNTER — Encounter: Payer: Self-pay | Admitting: Family Medicine

## 2023-08-04 ENCOUNTER — Ambulatory Visit: Admitting: Pulmonary Disease

## 2023-08-04 VITALS — BP 121/64 | HR 69 | Ht 63.0 in | Wt 171.0 lb

## 2023-08-04 DIAGNOSIS — K219 Gastro-esophageal reflux disease without esophagitis: Secondary | ICD-10-CM | POA: Diagnosis not present

## 2023-08-04 DIAGNOSIS — R0982 Postnasal drip: Secondary | ICD-10-CM

## 2023-08-04 DIAGNOSIS — J4530 Mild persistent asthma, uncomplicated: Secondary | ICD-10-CM

## 2023-08-04 DIAGNOSIS — Z87891 Personal history of nicotine dependence: Secondary | ICD-10-CM

## 2023-08-04 DIAGNOSIS — R0602 Shortness of breath: Secondary | ICD-10-CM

## 2023-08-04 MED ORDER — UMECLIDINIUM-VILANTEROL 62.5-25 MCG/ACT IN AEPB: 1.0000 | INHALATION_SPRAY | Freq: Every day | RESPIRATORY_TRACT | 5 refills | Status: DC

## 2023-08-04 MED ORDER — IPRATROPIUM BROMIDE 0.03 % NA SOLN: 2.0000 | Freq: Two times a day (BID) | NASAL | 12 refills | Status: DC

## 2023-08-04 NOTE — Patient Instructions (Addendum)
 Start anoro ellipta inhaler 1 puff daily  Continue to use albuterol  inhaler 1-2 puffs every 4-6 hours as needed  Recommend elevating the head of your bed 4-6 inches to reduce night time reflux  Take omeprazole  20mg  twice daily  Use ipratropium nasal spray, 2 sprays per nostril twice daily  We will schedule you for breathing tests at the front desk  Follow up in 3 months

## 2023-08-04 NOTE — Progress Notes (Signed)
 Synopsis: Referred in June 2025 for Shortness of breath  Subjective:   PATIENT ID: Cheryl [name] GENDER: female DOB: 1940/09/08, MRN: [id number]   HPI  Chief Complaint  Patient presents with   Consult    Pt states CC spells mostly at night ,  x 6 months , wheezing , Sob    Cheryl [name] is an 83 year old woman, former smoker with history of GERD, dementia and carotid stenosis who is referred to pulmonary clinic for shortness of breath.   She was seen by cardiology 07/21/23, note reviewed. She had normal spirometry in 2019.   She has experienced changes in breathing since December, with significant symptoms by January or February. Wheezing was initially noted by others. She denies current shortness of breath, experiencing it only occasionally with activity. A persistent cough with mucus production has not improved since onset. A cough wakes her up about once a week. She occasionally coughs up whitish mucus.  She was diagnosed with bronchitis at the end of last year and treated with albuterol . She has had multiple sinus infections and COVID-19, treated with antibiotics and steroids, which provided short-term relief. Currently, she is not experiencing significant sinus issues.  She takes omeprazole  for reflux at a half dose and uses albuterol  as needed. She avoids nasal sprays due to concerns about eye pressure related to borderline glaucoma.  Her past medical history includes heavy smoking, which she quit in 1993. She was exposed to secondhand smoke during childhood.   Past Medical History:  Diagnosis Date   Anemia    Anxiety    d/t recent family issues   Arthritis    Bone spur    Carotid stenosis, bilateral 06/06/2019   Chronic back pain    stenosis and OA   Dementia (HCC)    Depression    takes Paxil  daily   Diverticulosis    Dizzy    occasionally   Encephalitis    at 9 months ago    Fatty liver    has had 2 Hep shots and the final one is in June 18   GERD  (gastroesophageal reflux disease)    takes Omeprazole  daily   Hepatitis 2012   History of bronchitis    20-25 yrs ago   History of colon polyps    precancerous   History of shingles    Hyperlipidemia    takes Simvastatin  daily   IBS (irritable bowel syndrome)    more on side of constipation-takes Miralax  daily   Insomnia    takes Melatonin and Ambien  nightly   Internal hemorrhoids    Joint pain    Joint swelling    Lower leg edema 09/21/2021   Lung nodules    calcified    Migraine headache    migraines-thinks coming from neck.Last one1/28/18   Orthostatic hypotension 09/21/2021   Pneumonia    hx of-20 yrs ago-=-walking   Renal cyst    left   Uterine cancer (HCC) 1973     Family History  Problem Relation Age of Onset   Kidney disease Mother    Kidney failure Mother        med induced   Rheum arthritis Mother    Heart disease Father    Alcohol abuse Father    Lung cancer Father        smoker   Depression Brother    Crohn's disease Brother    Alcohol abuse Brother    Heart failure Daughter    Lung  cancer Son    HIV Son    Heart disease Other        Paternal family    Malignant hyperthermia Neg Hx    Colon cancer Neg Hx    Esophageal cancer Neg Hx    Rectal cancer Neg Hx    Stomach cancer Neg Hx    Migraines Neg Hx      Social History   Socioeconomic History   Marital status: Divorced    Spouse name: Not on file   Number of children: 5   Years of education: 9   Highest education level: 9th grade  Occupational History   Occupation: Retired      Associate Professor: RETIRED  Tobacco Use   Smoking status: Former    Current packs/day: 0.00    Types: Cigarettes    Quit date: 02/07/1993    Years since quitting: 30.5   Smokeless tobacco: Never  Vaping Use   Vaping status: Never Used  Substance and Sexual Activity   Alcohol use: No   Drug use: No   Sexual activity: Not on file  Other Topics Concern   Not on file  Social History Narrative   Lives with her dog.  She enjoys watching television. Her daughter lives close by.                  Social Drivers of Corporate investment banker Strain: Low Risk  (11/02/2022)   Overall Financial Resource Strain (CARDIA)    Difficulty of Paying Living Expenses: Not hard at all  Food Insecurity: Low Risk  (03/09/2023)   Received from [facility]   Hunger Vital Sign    Within the past 12 months, you worried that your food would run out before you got money to buy more: Never true    Within the past 12 months, the food you bought just didn't last and you didn't have money to get more. : Never true  Transportation Needs: No Transportation Needs (03/09/2023)   Received from [facility]    In the past 12 months, has lack of reliable transportation kept you from medical appointments, meetings, work or from getting things needed for daily living? : No  Physical Activity: Inactive (11/02/2022)   Exercise Vital Sign    Days of Exercise per Week: 0 days    Minutes of Exercise per Session: 0 min  Stress: No Stress Concern Present (11/02/2022)   [employer] of Occupational Health - Occupational Stress Questionnaire    Feeling of Stress : Not at all  Social Connections: Socially Isolated (11/02/2022)   Social Connection and Isolation Panel    Frequency of Communication with Friends and Family: More than three times a week    Frequency of Social Gatherings with Friends and Family: Three times a week    Attends Religious Services: Never    Active Member of Clubs or Organizations: No    Attends Banker Meetings: Never    Marital Status: Divorced  Catering manager Violence: Not At Risk (11/02/2022)   Humiliation, Afraid, Rape, and Kick questionnaire    Fear of Current or Ex-Partner: No    Emotionally Abused: No    Physically Abused: No    Sexually Abused: No     Allergies  Allergen Reactions   Celebrex [Celecoxib] Itching, Nausea Only and Other (See Comments)    Gas, hair  loss also   Fluoxetine Hcl Shortness Of Breath and Itching   Fluoxetine Hcl Itching and  Shortness Of Breath    REACTION: itching,SOB  REACTION: itching,SOB   Ambien  [Zolpidem ] Other (See Comments)    Headaches    Amitriptyline  Other (See Comments)    Dry mouth    Aricept  [Donepezil  Hcl] Other (See Comments)    Nightmares    Ciprofloxacin  Other (See Comments)    Muscle weakness   Dexamethasone  Diarrhea and Nausea And Vomiting   Iodinated Contrast Media Itching and Other (See Comments)    MultiHance  (probably not Multilance)  Multilance.   Livalo  [Pitavastatin ] Other (See Comments)    Myalgia    Metrizamide Itching and Other (See Comments)    AMIPAQUE (also allergic to MultiHance  (probably not Multilance)   Nitroglycerin  Nausea Only and Other (See Comments)    Dizziness and sweating, also   Rosuvastatin  Other (See Comments)    Body aches and cramping   Simvastatin  Other (See Comments)    Muscle aches and headaches   Statins Other (See Comments)    Could NOT tolerate- Made me feel like I had the flu   Codeine Sulfate Itching    REACTION: itching  REACTION: itching   Sulfonamide Derivatives Nausea Only    REACTION: Nausea     Outpatient Medications Prior to Visit  Medication Sig Dispense Refill   albuterol  (VENTOLIN  HFA) 108 (90 Base) MCG/ACT inhaler Inhale 2 puffs into the lungs every 6 (six) hours as needed for wheezing or shortness of breath. 8 g 0   aspirin 81 MG tablet Take 81 mg by mouth daily.     Cholecalciferol (VITAMIN D ) 1000 UNITS capsule Take 2,000 Units by mouth daily.     cycloSPORINE  (RESTASIS ) 0.05 % ophthalmic emulsion Place 1 drop into both eyes 2 (two) times daily. 5.5 mL 5   escitalopram  (LEXAPRO ) 10 MG tablet Take 1 tablet (10 mg total) by mouth daily. 90 tablet 1   Evolocumab  (REPATHA  SURECLICK) 140 MG/ML SOAJ Inject 140 mg into the skin every 14 (fourteen) days. 6 mL 3   EXCEDRIN MIGRAINE 250-250-65 MG tablet Take 2 tablets by mouth every 6  (six) hours as needed for migraine or headache.     MAGNESIUM  PO Take 250 mg by mouth daily.     Multiple Vitamin (MULTI-VITAMIN) tablet Take 1 tablet by mouth daily.     nystatin  (MYCOSTATIN /NYSTOP ) powder Apply 1 Application topically 2 (two) times daily as needed. 60 g 2   Omega-3 Fatty Acids (FISH OIL  PO) Take 2,400 mg by mouth daily. Softgel     omeprazole  (PRILOSEC) 20 MG capsule Take 20 mg by mouth in the morning and at bedtime. (Patient taking differently: Take 20 mg by mouth daily.)     polyethylene glycol (MIRALAX  / GLYCOLAX ) packet Take 17 g by mouth daily.     PREMARIN  vaginal cream Place 1 applicator vaginally as needed (vaginal dryness).     Probiotic TABS Take 1 tablet by mouth daily.     rivastigmine (EXELON) 4.6 mg/24hr Place 4.6 mg onto the skin daily.     traMADol  (ULTRAM ) 50 MG tablet Take 1 tablet (50 mg total) by mouth 2 (two) times daily as needed. for pain 60 tablet 1   TURMERIC PO Take 2 Pieces by mouth daily. 500mg  gummies     No facility-administered medications prior to visit.    Review of Systems  Constitutional:  Negative for chills, fever, malaise/fatigue and weight loss.  HENT:  Negative for congestion, sinus pain and sore throat.   Eyes: Negative.   Respiratory:  Positive for cough and  wheezing. Negative for hemoptysis, sputum production and shortness of breath.   Cardiovascular:  Negative for chest pain, palpitations, orthopnea, claudication and leg swelling.  Gastrointestinal:  Positive for heartburn. Negative for abdominal pain, nausea and vomiting.  Genitourinary: Negative.   Musculoskeletal:  Negative for joint pain and myalgias.  Skin:  Negative for rash.  Neurological:  Negative for weakness.  Endo/Heme/Allergies: Negative.   Psychiatric/Behavioral: Negative.     Objective:   Vitals:   08/04/23 1002  BP: 121/64  Pulse: 69  SpO2: 96%  Weight: 171 lb (77.6 kg)  Height: 5' 3 (1.6 m)   Physical Exam Constitutional:      General: She is  not in acute distress.    Appearance: Normal appearance.   Eyes:     General: No scleral icterus.    Conjunctiva/sclera: Conjunctivae normal.    Cardiovascular:     Rate and Rhythm: Normal rate and regular rhythm.  Pulmonary:     Breath sounds: No wheezing, rhonchi or rales.   Musculoskeletal:     Right lower leg: No edema.     Left lower leg: No edema.   Skin:    General: Skin is warm and dry.   Neurological:     General: No focal deficit present.     CBC    Component Value Date/Time   WBC 5.2 07/26/2022 0850   RBC 5.06 07/26/2022 0850   HGB 14.2 07/26/2022 0850   HCT 43.4 07/26/2022 0850   PLT 249 07/26/2022 0850   MCV 85.8 07/26/2022 0850   MCH 28.1 07/26/2022 0850   MCHC 32.7 07/26/2022 0850   RDW 13.3 07/26/2022 0850   LYMPHSABS 1,893 07/26/2022 0850   MONOABS 0.4 07/25/2021 1111   EOSABS 380 07/26/2022 0850   BASOSABS 31 07/26/2022 0850      Latest Ref Rng & Units 11/14/2022    8:35 AM 10/13/2022    9:17 AM 08/01/2022   11:38 AM  BMP  Glucose 70 - 99 mg/dL 879  892  95   BUN 8 - 27 mg/dL 17  13  17    Creatinine 0.57 - 1.00 mg/dL 9.03  8.91  8.91   BUN/Creat Ratio 12 - 28 18  12  16    Sodium 134 - 144 mmol/L 140  140  138   Potassium 3.5 - 5.2 mmol/L 4.3  4.9  5.1   Chloride 96 - 106 mmol/L 103  103  102   CO2 20 - 29 mmol/L 23  23  26    Calcium  8.7 - 10.3 mg/dL 89.6  89.9  9.8    Chest imaging: CT Chest 06/14/23 Cardiovascular: Somewhat limited due to lack of IV contrast. Atherosclerotic calcifications of the aorta are noted. No cardiac enlargement is seen. Pulmonary artery is not significantly enlarged. Coronary calcifications are noted.   Mediastinum/Nodes: Thoracic inlet is within normal limits. No hilar or mediastinal adenopathy is noted. Multiple calcified hilar and mediastinal lymph nodes are seen consistent with prior granulomatous disease. The esophagus as visualized is within normal limits.   Lungs/Pleura: Lungs are well aerated  bilaterally. No focal infiltrate or effusion is seen. Calcified and noncalcified nodules are seen. The largest of the noncalcified lesions lies in the medial aspect of the right lower lobe measuring 4 mm. This is stable in appearance from the prior exam from 2015 consistent with a benign etiology. No further follow-up is recommended.  PFT:     No data to display  Labs:  Path:  Echo:  Heart Catheterization:       Assessment & Plan:   Shortness of breath  Gastroesophageal reflux disease without esophagitis  Mild persistent reactive airway disease without complication - Plan: umeclidinium-vilanterol (ANORO ELLIPTA) 62.5-25 MCG/ACT AEPB, Pulmonary Function Test  Post-nasal drip - Plan: ipratropium (ATROVENT ) 0.03 % nasal spray  Discussion: Cheryl [name] is an 83 year old woman, former smoker with history of GERD, dementia and carotid stenosis who is referred to pulmonary clinic for shortness of breath.   Reactive airways disease Reactive airways disease, possibly asthma, with wheezing and cough. - Order pulmonary function tests. - Prescribe Anoro Ellipta inhaler, one puff daily.  - Continue albuterol  inhaler as needed. - Avoid inhaled steroids due to increased ocular pressures noted by opthalmologist  Post Nasal Drip Recurrent sinusitis with post-nasal drip contributing to cough. Recent COVID-19 infection may have exacerbated symptoms. - Prescribe ipratropium nasal spray, two sprays per nostril twice daily. - Encourage saline nasal spray use.  GERD - Increase omeprazole  to 20 mg twice daily. - Recommend wedge pillow or bed risers. - Advise eating at least two hours before lying down.  Former Smoker Smoking history with cessation in 1993. Increased risk of lung disease and cancer, but risk reduced after 15 years of cessation. Previous CT scan showed well-managed lung nodules and airway thickening consistent with bronchitis. - Monitor lung function with  pulmonary function tests.  Follow up in 3 months  Dorn Chill, MD [facility] Office: [phone]    Current Outpatient Medications:    ipratropium (ATROVENT ) 0.03 % nasal spray, Place 2 sprays into both nostrils every 12 (twelve) hours., Disp: 30 mL, Rfl: 12   umeclidinium-vilanterol (ANORO ELLIPTA) 62.5-25 MCG/ACT AEPB, Inhale 1 puff into the lungs daily., Disp: 60 each, Rfl: 5   albuterol  (VENTOLIN  HFA) 108 (90 Base) MCG/ACT inhaler, Inhale 2 puffs into the lungs every 6 (six) hours as needed for wheezing or shortness of breath., Disp: 8 g, Rfl: 0   aspirin 81 MG tablet, Take 81 mg by mouth daily., Disp: , Rfl:    Cholecalciferol (VITAMIN D ) 1000 UNITS capsule, Take 2,000 Units by mouth daily., Disp: , Rfl:    cycloSPORINE  (RESTASIS ) 0.05 % ophthalmic emulsion, Place 1 drop into both eyes 2 (two) times daily., Disp: 5.5 mL, Rfl: 5   escitalopram  (LEXAPRO ) 10 MG tablet, Take 1 tablet (10 mg total) by mouth daily., Disp: 90 tablet, Rfl: 1   Evolocumab  (REPATHA  SURECLICK) 140 MG/ML SOAJ, Inject 140 mg into the skin every 14 (fourteen) days., Disp: 6 mL, Rfl: 3   EXCEDRIN MIGRAINE 250-250-65 MG tablet, Take 2 tablets by mouth every 6 (six) hours as needed for migraine or headache., Disp: , Rfl:    MAGNESIUM  PO, Take 250 mg by mouth daily., Disp: , Rfl:    Multiple Vitamin (MULTI-VITAMIN) tablet, Take 1 tablet by mouth daily., Disp: , Rfl:    nystatin  (MYCOSTATIN /NYSTOP ) powder, Apply 1 Application topically 2 (two) times daily as needed., Disp: 60 g, Rfl: 2   Omega-3 Fatty Acids (FISH OIL  PO), Take 2,400 mg by mouth daily. Softgel, Disp: , Rfl:    omeprazole  (PRILOSEC) 20 MG capsule, Take 20 mg by mouth in the morning and at bedtime. (Patient taking differently: Take 20 mg by mouth daily.), Disp: , Rfl:    polyethylene glycol (MIRALAX  / GLYCOLAX ) packet, Take 17 g by mouth daily., Disp: , Rfl:    PREMARIN  vaginal cream, Place 1 applicator vaginally as needed  (  vaginal dryness)., Disp: , Rfl:    Probiotic TABS, Take 1 tablet by mouth daily., Disp: , Rfl:    rivastigmine (EXELON) 4.6 mg/24hr, Place 4.6 mg onto the skin daily., Disp: , Rfl:    traMADol  (ULTRAM ) 50 MG tablet, Take 1 tablet (50 mg total) by mouth 2 (two) times daily as needed. for pain, Disp: 60 tablet, Rfl: 1   TURMERIC PO, Take 2 Pieces by mouth daily. 500mg  gummies, Disp: , Rfl:

## 2023-08-08 ENCOUNTER — Encounter: Payer: Self-pay | Admitting: Neurology

## 2023-08-08 ENCOUNTER — Telehealth: Payer: Self-pay | Admitting: Pharmacy Technician

## 2023-08-08 ENCOUNTER — Ambulatory Visit: Admitting: Neurology

## 2023-08-08 VITALS — BP 104/64 | HR 66 | Ht 63.0 in | Wt 174.0 lb

## 2023-08-08 DIAGNOSIS — F01B0 Vascular dementia, moderate, without behavioral disturbance, psychotic disturbance, mood disturbance, and anxiety: Secondary | ICD-10-CM

## 2023-08-08 DIAGNOSIS — F02B0 Dementia in other diseases classified elsewhere, moderate, without behavioral disturbance, psychotic disturbance, mood disturbance, and anxiety: Secondary | ICD-10-CM

## 2023-08-08 DIAGNOSIS — G301 Alzheimer's disease with late onset: Secondary | ICD-10-CM | POA: Diagnosis not present

## 2023-08-08 NOTE — Addendum Note (Signed)
 Addended by: Audrinna Sherman B on: 08/08/2023 01:07 PM   Modules accepted: Level of Service

## 2023-08-08 NOTE — Telephone Encounter (Signed)
 Patient Advocate Encounter   The patient was approved for a [employer] grant that will help cover the cost of repatha  Total amount awarded, 2500.  Effective: 09/06/23 - 09/04/24   APW:[id number] ERW:[id number] Hmnle:[id number] PI:[id number] [employer] ID: [id number]  This is for after her other grant expires. Patient informed via mychart

## 2023-08-08 NOTE — Progress Notes (Signed)
 [facility]    Provider:  Dr Ines Requesting Provider: [name], [name], DO Primary Care Provider:  [name], [name], DO  CC:  dementia and migrains  HPI:  Cheryl [name] is a 83 y.o. female here as requested by [name], [name], DO for dementia on rivastigmine.  has Anxiety and depression; Internal hemorrhoids; Diverticulosis of colon; CONSTIPATION, CHRONIC; IRRITABLE BOWEL SYNDROME; Disorder of kidney and ureter; MICROSCOPIC HEMATURIA; Arthropathy; Chronic neck pain; STENOSIS, LUMBAR SPINE; Disorder of bone and cartilage; Insomnia; Asymptomatic postmenopausal status; PALPITATIONS; Acute non-recurrent maxillary sinusitis; Granulomatous lung disease (HCC); Dyspepsia; Obesity; Osteoarthritis of left knee; Hyperlipidemia; Onychodystrophy; Fatty liver; Lumbar stenosis with neurogenic claudication; S/P lumbar fusion; Deviated septum; ETD (Eustachian tube dysfunction), bilateral; Seasonal allergic rhinitis; Bilateral hearing loss; Right foot pain; Numbness of left foot; Myofascial pain; Migraine headache; Daily headache; Carotid stenosis, bilateral; Chronic migraine without aura, with intractable migraine, so stated, with status migrainosus; Abnormal glucose; Osteopenia; Pharyngoesophageal dysphagia; Dementia (HCC); DDD (degenerative disc disease), cervical; Stenosis of cervical spine with myelopathy (HCC); Lower leg edema; Orthostatic hypotension; History of recurrent UTIs; Cough; Acute thoracic back pain; Bilateral impacted cerumen; Cervicogenic headache; Disequilibrium; Globus pharyngeus; Gastroesophageal reflux disease without esophagitis; Lumbar spondylosis; Rhinitis, chronic; Sensorineural hearing loss (SNHL) of both ears; Iron deficiency anemia; Vitamin D  deficiency; Transaminitis; Menopause; Chronic low back pain without sciatica; Balance problem; Wheezing; and Chronic bronchitis (HCC) on their problem list.  She lives in the same area as her daughter. Progressive memory  loss. Better socially. No depression, no significant anxiety managed well with medications. No recent falls in the last year. Follows with ENT for throat and stretching no choking, no food going down the wrong way. Not driving. She has evrything in the house she needs. Daughter is here and provides much information. She is using a cane. They are managing fall prevention in the home. She feels quality of life is good. Is increasing the rivastigmine and no side effects. Could also start Memantine. She lives with a dog called boo. Not exercising, daughter aware of silver sneakers and is supportive and encouraging, staying social, recommended getting her hearing aids fixed (they are not working). Things are going well. Would not recommend Lecanemab or donanemab. Doesn't have migraines often, she states ubrelvy  hasn;t worked, she takes excedrin and that helps. We discussed medications used to slow memory loss including rivastigmine which she is on (tried Aricept  and other acetylcholinesterase inhibitors in the past and had side effects) and also adding on Namenda.  We discussed the new medications the monoclonal antibodies for amyloid in the brain at this point given that she has mixed vascular and Alzheimer's dementia with extensive white matter changes I think her risk of bleeding would be elevated(ARIA) and given mixed dementia the results will not be as robust at this time I do recommend, since her quality of life appears to be good, to monitor and increase rivastigmine to max and then start Namenda.  She can then follow-up with her primary care to continue providing it, I am happy to see them however I do not think that there is anything more that neurology can provide at this point.  Her migraines are under good control.  She only takes acute management medication states that works for her Excedrin.  We also discussed making sure to address her hearing issues as this can be implicated in dementia, mood, managing  vascular risk factors, she is not driving, safety in the home.  Return to primary care.  Reviewed nots and patient has alzhemiers  and vascular dementia per Dr. Chet 05/15/2023.  Per notes she has Alzheimer's and vascular dementia, on 4.6 mg patch daily for some time, was previously followed by a memory specialist in [place] but hard for her to follow-up regularly there due to distance,.  She also has a history of migraines on Ubrelvy  Excedrin as needed not occurring with much regularity anymore.  She also has a very complicated history as problem list above shows.  She follows with cardiology, ophthalmology, urology, pulmonology, ENT and GI.   Reviewed notes, labs and imaging from outside physicians, which showed: 07/25/2021: CLINICAL DATA:  Neuro deficit, acute, stroke suspected. Transient dizziness and nausea when turning neck to the left.   EXAM: personally reviewed images and also reviewed with patient and daughter MRI HEAD WITHOUT CONTRAST   TECHNIQUE: Multiplanar, multiecho pulse sequences of the brain and surrounding structures were obtained without intravenous contrast.   COMPARISON:  MR head without and with contrast 03/02/2020.   FINDINGS: Brain: No acute infarct, hemorrhage, the diffusion-weighted images demonstrate no acute or subacute infarction. Advanced atrophy and diffuse white matter disease is present bilaterally, similar the prior exam. The ventricles are proportionate to the degree of atrophy. No significant extraaxial fluid collection is present.   Basal ganglia are within normal limits. T2 hyperintensities are present in the external capsule bilaterally. Insular ribbon is normal.   The internal auditory canals are within normal limits. The brainstem and cerebellum are within normal limits.   Vascular: Flow is present in the major intracranial arteries.   Skull and upper cervical spine: The craniocervical junction is normal. Upper cervical spine is  within normal limits. Marrow signal is unremarkable.   Sinuses/Orbits: Small mastoid effusions are present bilaterally. No obstructing nasopharyngeal lesion is present. Bilateral lens replacements are noted. Globes and orbits are otherwise unremarkable.   IMPRESSION: 1. No acute intracranial abnormality or significant interval change. 2. Advanced atrophy and diffuse white matter disease likely reflects the sequela of chronic microvascular ischemia. 3. Small mastoid effusions bilaterally. No obstructing nasopharyngeal lesion is present.  10/13/2022: TSH nml  11/14/2022: hgba1c 5.8  08/01/2022 vit d 53 nml  02/15/2021: b12 nml 550 Rpr NR  2020 b1 nml   Review of Systems: Patient complains of symptoms per HPI as well as the following symptoms none. Pertinent negatives and positives per HPI. All others negative.   Social History   Socioeconomic History   Marital status: Divorced    Spouse name: Not on file   Number of children: 5   Years of education: 9   Highest education level: 9th grade  Occupational History   Occupation: Retired      Associate Professor: RETIRED  Tobacco Use   Smoking status: Former    Current packs/day: 0.00    Types: Cigarettes    Quit date: 02/07/1993    Years since quitting: 30.5   Smokeless tobacco: Never  Vaping Use   Vaping status: Never Used  Substance and Sexual Activity   Alcohol use: No   Drug use: No   Sexual activity: Not on file  Other Topics Concern   Not on file  Social History Narrative   Lives with her dog. She enjoys watching television. Her daughter lives close by.      Retired                Teacher, early years/pre Strain: Low Risk  (11/02/2022)   Overall Financial Resource Strain (CARDIA)    Difficulty of Paying Living  Expenses: Not hard at all  Food Insecurity: Low Risk  (03/09/2023)   Received from [facility]   Hunger Vital Sign    Within the past 12 months, you worried that your food would run out  before you got money to buy more: Never true    Within the past 12 months, the food you bought just didn't last and you didn't have money to get more. : Never true  Transportation Needs: No Transportation Needs (03/09/2023)   Received from [facility]    In the past 12 months, has lack of reliable transportation kept you from medical appointments, meetings, work or from getting things needed for daily living? : No  Physical Activity: Inactive (11/02/2022)   Exercise Vital Sign    Days of Exercise per Week: 0 days    Minutes of Exercise per Session: 0 min  Stress: No Stress Concern Present (11/02/2022)   [employer] of Occupational Health - Occupational Stress Questionnaire    Feeling of Stress : Not at all  Social Connections: Socially Isolated (11/02/2022)   Social Connection and Isolation Panel    Frequency of Communication with Friends and Family: More than three times a week    Frequency of Social Gatherings with Friends and Family: Three times a week    Attends Religious Services: Never    Active Member of Clubs or Organizations: No    Attends Banker Meetings: Never    Marital Status: Divorced  Catering manager Violence: Not At Risk (11/02/2022)   Humiliation, Afraid, Rape, and Kick questionnaire    Fear of Current or Ex-Partner: No    Emotionally Abused: No    Physically Abused: No    Sexually Abused: No    Family History  Problem Relation Age of Onset   Kidney disease Mother    Kidney failure Mother        med induced   Rheum arthritis Mother    Memory loss Mother    Heart disease Father    Alcohol abuse Father    Lung cancer Father        smoker   Depression Brother    Crohn's disease Brother    Alcohol abuse Brother    Heart failure Daughter    Lung cancer Son    HIV Son    Heart disease Other        Paternal family    Malignant hyperthermia Neg Hx    Colon cancer Neg Hx    Esophageal cancer Neg Hx    Rectal cancer Neg Hx     Stomach cancer Neg Hx    Migraines Neg Hx     Past Medical History:  Diagnosis Date   Anemia    Anxiety    d/t recent family issues   Arthritis    Bone spur    Carotid stenosis, bilateral 06/06/2019   Chronic back pain    stenosis and OA   Dementia (HCC)    Depression    takes Paxil  daily   Diverticulosis    Dizzy    occasionally   Encephalitis    at 9 months ago    Fatty liver    has had 2 Hep shots and the final one is in June 18   GERD (gastroesophageal reflux disease)    takes Omeprazole  daily   Hepatitis 2012   History of bronchitis    20-25 yrs ago   History of colon polyps    precancerous  History of shingles    Hyperlipidemia    takes Simvastatin  daily   IBS (irritable bowel syndrome)    more on side of constipation-takes Miralax  daily   Insomnia    takes Melatonin and Ambien  nightly   Internal hemorrhoids    Joint pain    Joint swelling    Lower leg edema 09/21/2021   Lung nodules    calcified    Migraine headache    migraines-thinks coming from neck.Last one1/28/18   Orthostatic hypotension 09/21/2021   Pneumonia    hx of-20 yrs ago-=-walking   Renal cyst    left   Uterine cancer [facility]) 1973    Patient Active Problem List   Diagnosis Date Noted   Chronic bronchitis (HCC) 05/09/2023   Wheezing 04/24/2023   Menopause 11/15/2022   Chronic low back pain without sciatica 11/15/2022   Balance problem 11/15/2022   Acute thoracic back pain 08/01/2022   Iron deficiency anemia 08/01/2022   Vitamin D  deficiency 08/01/2022   Transaminitis 08/01/2022   Cough 05/12/2022   Bilateral impacted cerumen 01/19/2022   Disequilibrium 01/19/2022   Sensorineural hearing loss (SNHL) of both ears 01/19/2022   History of recurrent UTIs 10/08/2021   Lower leg edema 09/21/2021   Orthostatic hypotension 09/21/2021   DDD (degenerative disc disease), cervical 03/01/2021   Stenosis of cervical spine with myelopathy (HCC) 03/01/2021   Dementia (HCC) 02/15/2021    Pharyngoesophageal dysphagia 12/14/2020   Osteopenia 10/23/2020   Abnormal glucose 08/13/2020   Chronic migraine without aura, with intractable migraine, so stated, with status migrainosus 07/03/2019   Carotid stenosis, bilateral 06/06/2019   Globus pharyngeus 05/15/2019   Gastroesophageal reflux disease without esophagitis 05/15/2019   Rhinitis, chronic 05/15/2019   Migraine headache 03/22/2019   Daily headache 03/22/2019   Myofascial pain 03/27/2018   Numbness of left foot 11/07/2017   Right foot pain 09/12/2017   Cervicogenic headache 02/08/2017   Deviated septum 01/25/2017   ETD (Eustachian tube dysfunction), bilateral 01/25/2017   Seasonal allergic rhinitis 01/25/2017   Bilateral hearing loss 01/25/2017   S/P lumbar fusion 06/20/2016   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   Fatty liver 12/22/2015   Onychodystrophy 11/24/2015   Hyperlipidemia 12/17/2013   Lumbar spondylosis 11/21/2013   Osteoarthritis of left knee 07/05/2013   Obesity 03/10/2011   Dyspepsia 02/10/2011   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   Acute non-recurrent maxillary sinusitis 03/05/2010   PALPITATIONS 02/11/2010   Disorder of bone and cartilage 11/27/2009   CONSTIPATION, CHRONIC 08/15/2008   Insomnia 07/08/2008   Chronic neck pain 04/11/2008   Disorder of kidney and ureter 01/21/2008   Internal hemorrhoids 01/16/2008   Diverticulosis of colon 01/16/2008   Asymptomatic postmenopausal status 12/05/2007   MICROSCOPIC HEMATURIA 06/05/2007   STENOSIS, LUMBAR SPINE 12/22/2005   Anxiety and depression 11/15/2005   IRRITABLE BOWEL SYNDROME 11/15/2005   Arthropathy 11/15/2005    Past Surgical History:  Procedure Laterality Date   APPENDECTOMY  1966   cataracts     bilateral   COLONOSCOPY  09/04/2020   EUS  02/10/2011   Procedure: UPPER ENDOSCOPIC ULTRASOUND (EUS) RADIAL;  Surgeon: Toribio Cedar, MD;  Location: [facility];  Service: Endoscopy;  Laterality: N/A;  radial  linear    LUMBAR FUSION  03/2016   L4-5, Dr. Epifanio.   RECTOCELE REPAIR  2011   ROTATOR CUFF REPAIR  2009   left   TONSILLECTOMY     UPPER GASTROINTESTINAL ENDOSCOPY     VAGINAL HYSTERECTOMY  1973  Current Outpatient Medications  Medication Sig Dispense Refill   albuterol  (VENTOLIN  HFA) 108 (90 Base) MCG/ACT inhaler Inhale 2 puffs into the lungs every 6 (six) hours as needed for wheezing or shortness of breath. 8 g 0   aspirin 81 MG tablet Take 81 mg by mouth daily.     Cholecalciferol (VITAMIN D ) 1000 UNITS capsule Take 2,000 Units by mouth daily.     cycloSPORINE  (RESTASIS ) 0.05 % ophthalmic emulsion Place 1 drop into both eyes 2 (two) times daily. 5.5 mL 5   escitalopram  (LEXAPRO ) 10 MG tablet Take 1 tablet (10 mg total) by mouth daily. 90 tablet 1   Evolocumab  (REPATHA  SURECLICK) 140 MG/ML SOAJ Inject 140 mg into the skin every 14 (fourteen) days. 6 mL 3   EXCEDRIN MIGRAINE 250-250-65 MG tablet Take 2 tablets by mouth every 6 (six) hours as needed for migraine or headache.     ipratropium (ATROVENT ) 0.03 % nasal spray Place 2 sprays into both nostrils every 12 (twelve) hours. 30 mL 12   MAGNESIUM  PO Take 250 mg by mouth daily.     Multiple Vitamin (MULTI-VITAMIN) tablet Take 1 tablet by mouth daily.     nystatin  (MYCOSTATIN /NYSTOP ) powder Apply 1 Application topically 2 (two) times daily as needed. 60 g 2   Omega-3 Fatty Acids (FISH OIL  PO) Take 2,400 mg by mouth daily. Softgel     omeprazole  (PRILOSEC) 20 MG capsule Take 20 mg by mouth in the morning and at bedtime. (Patient taking differently: Take 20 mg by mouth daily.)     polyethylene glycol (MIRALAX  / GLYCOLAX ) packet Take 17 g by mouth daily.     PREMARIN  vaginal cream Place 1 applicator vaginally as needed (vaginal dryness).     Probiotic TABS Take 1 tablet by mouth daily.     rivastigmine (EXELON) 4.6 mg/24hr Place 4.6 mg onto the skin daily.     traMADol  (ULTRAM ) 50 MG tablet Take 1 tablet (50 mg total) by mouth 2  (two) times daily as needed. for pain 60 tablet 1   TURMERIC PO Take 2 Pieces by mouth daily. 500mg  gummies     umeclidinium-vilanterol (ANORO ELLIPTA) 62.5-25 MCG/ACT AEPB Inhale 1 puff into the lungs daily. 60 each 5   No current facility-administered medications for this visit.    Allergies as of 08/08/2023 - Review Complete 08/08/2023  Allergen Reaction Noted   Celebrex [celecoxib] Itching, Nausea Only, and Other (See Comments) 03/20/2020   Fluoxetine hcl Shortness Of Breath and Itching 12/20/2005   Fluoxetine hcl Itching and Shortness Of Breath 12/20/2005   Ambien  [zolpidem ] Other (See Comments) 03/11/2021   Amitriptyline  Other (See Comments) 11/19/2019   Aricept  [donepezil  hcl] Other (See Comments) 01/21/2022   Ciprofloxacin  Other (See Comments) 01/21/2022   Dexamethasone  Diarrhea and Nausea And Vomiting 04/07/2016   Iodinated contrast media Itching and Other (See Comments) 08/01/2011   Livalo  [pitavastatin ] Other (See Comments) 02/08/2017   Metrizamide Itching and Other (See Comments) 08/01/2011   Nitroglycerin  Nausea Only and Other (See Comments) 09/26/2017   Rosuvastatin  Other (See Comments) 01/20/2017   Simvastatin  Other (See Comments) 12/22/2016   Statins Other (See Comments) 07/25/2021   Codeine sulfate Itching 12/20/2005   Sulfonamide derivatives Nausea Only 12/20/2005    Vitals: BP 104/64   Pulse 66   Ht 5' 3 (1.6 m)   Wt 174 lb (78.9 kg)   BMI 30.82 kg/m  Last Weight:  Wt Readings from Last 1 Encounters:  08/08/23 174 lb (78.9 kg)   Last Height:  Ht Readings from Last 1 Encounters:  08/08/23 5' 3 (1.6 m)     Physical exam: Exam: Gen: NAD, conversant, well nourised, obese, well groomed                     CV: RRR, no MRG. No Carotid Bruits. No peripheral edema, warm, nontender Eyes: Conjunctivae clear without exudates or hemorrhage  Neuro: Detailed Neurologic Exam  Speech:    Speech is normal; fluent and spontaneous with normal comprehension.   Cognition:    08/08/2023   10:41 AM 02/15/2021    4:34 PM  MMSE - Mini Mental State Exam  Orientation to time 4 5  Orientation to Place 3 2  Registration 3 3  Attention/ Calculation 1 5  Recall 1 2  Language- name 2 objects 2 2  Language- repeat 1 0  Language- follow 3 step command 3 3  Language- read & follow direction 1 1  Write a sentence 1 1  Copy design 1 0  Total score 21 24       The patient is oriented to person, place, and time;     recent and remote memory intact;     language fluent;     normal attention, concentration,     fund of knowledge Cranial Nerves:    The pupils are equal, round, and reactive to light. Optic nerves appear healthy on fundoscopy Visual fields are full to finger confrontation. Extraocular movements are intact. Trigeminal sensation is intact and the muscles of mastication are normal. The face is symmetric. The palate elevates in the midline. Hearing intact. Voice is normal. Shoulder shrug is normal. The tongue has normal motion without fasciculations.   Coordination: nml  Gait: Walks with a cane, good strie and balance  Motor Observation:    No asymmetry, no atrophy, and no involuntary movements noted. Tone:    Normal muscle tone.    Posture:    Posture is normal. normal erect    Strength:    Strength is V/V in the upper and lower limbs.      Sensation: intact to LT     Reflex Exam:  DTR's:    Deep tendon reflexes in the upper and lower extremities are symmetrical bilaterally.   Toes:    The toes are equiv bilaterally.   Clonus:    Clonus is absent.    Assessment/Plan:  Patient with mixed vascular dementia and alzheimer's dementia. Was following with [facility].  We discussed medications used to slow memory loss including rivastigmine which she is on (tried Aricept  and other acetylcholinesterase inhibitors in the past and had side effects) and also adding on Namenda.   We discussed the new medications the  monoclonal antibodies for amyloid in the brain and at this point given that she has mixed vascular and Alzheimer's dementia with extensive white matter changes so I think her risk of bleeding would be elevated(ARIA) and given mixed dementia the results will not be as robust at this time I do recommend, since her quality of life appears to be good,  Continue to monitor and increase rivastigmine to max and then start Namenda.  She can then follow-up with her primary care to continue providing it, I do not think that there is anything more that neurology can provide at this point for her dementia We also discussed making sure to address her hearing issues as this can be implicated in dementia, mood, managing vascular risk factors, she is not driving,  safety in the home.   Discussed staying social, exercising, dirt and other ways to slow down progression Her migraines are under good control.  She only takes acute management medication states that works for her Excedrin.   Return to primary care.  PLAN: Increase Rivastigmine to the max dose; take the next dose 9.5 for a 3-4 weeks months and then can mychart me for the next dose which is max dose 13.3 mg(discussed watching for signs/symptoms of low pulse such as lightheadedness) Then can start Namenda 5 mg twice a day and if stable for 1-2 months can increase 10mg  twice daily Then can follow up with Dr. Chet   Cc: [name], [name], DO,  [name], [name], DO  Onetha Epp, MD  [facility] [address] [place], [place] [address]  Phone [phone] Fax [phone]  I spent 60 minutes of face-to-face and non-face-to-face time with patient on the  1. Moderate late onset Alzheimer's dementia without behavioral disturbance, psychotic disturbance, mood disturbance, or anxiety (HCC)   2. Moderate vascular dementia without behavioral disturbance, psychotic disturbance, mood disturbance, or anxiety (HCC)     diagnosis.  This included previsit chart review, lab review, study review, order entry, electronic health record documentation, patient education on the different diagnostic and therapeutic options, counseling and coordination of care, risks and benefits of management, compliance, or risk factor reduction

## 2023-08-08 NOTE — Patient Instructions (Addendum)
 Increase Rivastigmine to the max dose; take the next dose 9.5 for a 3-4 weeks months and then can mychart me for the next dose which is max dose 13.3 mg(discussed watching for signs/symptoms of low pulse such as lightheadedness) Then can start Namenda 5 mg twice a day and if stable for 1-2 months can increase 10mg  twice daily Then can follow up with Dr. [name]  Memantine Tablets What is this medication? MEMANTINE (MEM an teen) treats memory loss and confusion (dementia) in people who have Alzheimer disease. It works by improving attention, memory, and the ability to engage in daily activities. It is not a cure for dementia or Alzheimer disease. This medicine may be used for other purposes; ask your health care provider or pharmacist if you have questions. COMMON BRAND NAME(S): Namenda What should I tell my care team before I take this medication? They need to know if you have any of these conditions: Kidney disease Liver disease Seizures Trouble passing urine An unusual or allergic reaction to memantine, other medications, foods, dyes, or preservatives Pregnant or trying to get pregnant Breast-feeding How should I use this medication? Take this medication by mouth with water. Follow the directions on the prescription label. You may take this medication with or without food. Take your doses at regular intervals. Do not take your medication more often than directed. Continue to take your medication even if you feel better. Do not stop taking except on the advice of your care team. Talk to your care team about the use of this medication in children. Special care may be needed Overdosage: If you think you have taken too much of this medicine contact a poison control center or emergency room at once. NOTE: This medicine is only for you. Do not share this medicine with others. What if I miss a dose? If you miss a dose, take it as soon as you can. If it is almost time for your next dose, take only  that dose. Do not take double or extra doses. If you do not take your medication for several days, contact your care team. Your dose may need to be changed. What may interact with this medication? Acetazolamide Amantadine Cimetidine Dextromethorphan Dofetilide Hydrochlorothiazide Ketamine Metformin Methazolamide Quinidine Ranitidine Sodium bicarbonate Triamterene This list may not describe all possible interactions. Give your health care provider a list of all the medicines, herbs, non-prescription drugs, or dietary supplements you use. Also tell them if you smoke, drink alcohol, or use illegal drugs. Some items may interact with your medicine. What should I watch for while using this medication? Visit your care team for regular checks on your progress. Check with your care team if there is no improvement in your symptoms or if they get worse. This medication may affect your coordination, reaction time, or judgment. Do not drive or operate machinery until you know how this medication affects you. Sit up or stand slowly to reduce the risk of dizzy or fainting spells. Drinking alcohol with this medication can increase the risk of these side effects. What side effects may I notice from receiving this medication? Side effects that you should report to your care team as soon as possible: Allergic reactions--skin rash, itching, hives, swelling of the face, lips, tongue, or throat Side effects that usually do not require medical attention (report to your care team if they continue or are bothersome): Confusion Constipation Diarrhea Dizziness Headache This list may not describe all possible side effects. Call your doctor for medical advice about  side effects. You may report side effects to FDA at 1-800-FDA-1088. Where should I keep my medication? Keep out of the reach of children. Store at room temperature between 15 degrees and 30 degrees C (59 degrees and 86 degrees F). Throw away any unused  medication after the expiration date. NOTE: This sheet is a summary. It may not cover all possible information. If you have questions about this medicine, talk to your doctor, pharmacist, or health care provider.  2024 Elsevier/Gold Standard (2021-02-16 00:00:00)  Recommendations to prevent or slow progression of cognitive decline:   Exercise You should increase exercise 30 to 45 minutes per day at least 3 days a week although 5 to 7 would be preferred. Any type of exercise (including walking) is acceptable although a recumbent bicycle may be best if you are unsteady. Disease related apathy can be a significant roadblock to exercise and the only way to overcome this is to make it a daily routine and perhaps have a reward at the end (something your loved one loves to eat or drink perhaps) or a personal trainer coming to the home can also be very useful. In general a structured, repetitive schedule is best.   Cardiovascular Health: You should optimize all cardiovascular risk factors (blood pressure, sugar, cholesterol) as vascular disease such as strokes and heart attacks can make memory problems much worse.   Diet: Eating a heart healthy (Mediterranean) diet is also a good idea; fish and poultry instead of red meat, nuts (mostly non-peanuts), vegetables, fruits, olive oil or canola oil (instead of butter), minimal salt (use other spices to flavor foods), whole grain rice, bread, cereal and pasta and wine in moderation.  General Health: Any diseases which effect your body will effect your brain such as a pneumonia, urinary infection, blood clot, heart attack or stroke. Keep contact with your primary care doctor for regular follow ups.  Sleep. A good nights sleep is healthy for the brain. Seven hours is recommended. If you have insomnia or poor sleep habits see the recommendations below  Tips: Structured and consistent daytime and nighttime routine, including regular wake times, bedtimes, and  mealtimes, will be important for the patient to avoid confusion. Keeping frequently used items in designated places will help reduce stress from searching. If there are worries about getting lost do not let the patient leave home unaccompanied. They might benefit from wearing an identification bracelet that will help others assist in finding home if they become lost. Information about nationwide safe return services and other helpful resources may be obtained through the Alzheimer's Association helpline at [phone].  Finances, Power of Restaurant manager, fast food Directives: You should consider putting legal safeguards in place with regard to financial and medical decision making. While the spouse always has power of attorney for medical and financial issues in the absence of any form, you should consider what you want in case the spouse / caregiver is no longer around or capable of making decisions.   [place]  : MovieEvening.com.au.pdf  Or [employer] AND An Advance Directive for [place]   Other States: PainBasics.com.au  The signature on these forms should be notarized.   RESOURCES:  Memory Loss: Improve your short term memory By Lorrene Calin  The Alzheimer's Reading Room http://[email or web address]   The Alzheimer's Compendium http://www.alzcompend.info/  [employer] www.dukefamilysupport.nmh [phone]  Recommended resources for caregivers (All can be purchased on [employer]):  1) A Caregiver's Guide to Dementia: Using Activities and Other Strategies to Prevent, Reduce and Manage Behavioral  Symptoms by Leita SAILOR. [place] and [employer]   2) A Caregiver's Guide to [place] & Minor Dementia by Sherrilyn Trudell Gammon MS BSN and Lynwood Gammon   3) What If It's Not Alzheimer's?: A Caregiver's Guide to  Dementia by Arley Boon (Author), Olam Boon (Editor)  3) The 36 hour day by Rabins and Mace  4) Understanding Difficult Behaviors by Lang Mirza and White  Online course for helping caregivers reduce stress, guilt and frustration called the Caregivers Helpbook. The website is www.powerfultoolsforcaregivers.org  As a caregiver you are a Government social research officer. Problems you face as a caregiver are usually unique to your situation and the way your loved-one's disease manifests itself. The best way to use these books is to look at the Table of Contents and read any chapters of interest or that apply to challenges you are having as a caregiver.  NATIONAL RESOURCES: For more information on neurological disorders or research programs funded by the [employer] of Neurological Disorders and Stroke, contact the Institute's Gaffer (BRAIN) at: BRAIN [address] [place] [address] [phone] (toll-free) ToledoAutomobile.co.uk  Information on dementia is also available from the following organizations: Alzheimer's Disease Education and Referral (ADEAR) Center [employer] on Aging [address] [place] [address] [phone] (toll-free) CashCowGambling.be  Alzheimer's Association [address] [place], [place] [address] [phone] (toll-free, 24-hour helpline) [phone] (TDD) LimitLaws.hu  Alzheimer's Foundation of [place] [address] [place] , [place] [address] [phone] (toll-free) www.alzfdn.org  Alzheimer's Drug Discovery Foundation [address], [address] [place] , [place] [address] [phone] www.alzdiscovery.org  Association for Frontotemporal Degeneration [employer] [address] [place], [place] [address] [phone] (toll-free) www.theaftd.[facility]  [facility] [facility] [address] [place], [place] [address] [phone]  (toll-free) www.brightfocus.org/alzheimers  Norleen Berg [facility] Alzheimer's Foundation [address] [place] [address] [phone] www.BushWebsites.nl  Lewy Body Dementia Association [address] [place], [place] [address] [phone] (toll-free LBD Caregiver Link) www.lbda.[facility] of Mental Health [address] [place], [place] [address] [phone] (toll-free) [phone] [facility]) http://www.maynard.net/  National Organization for Rare Disorders [address] [place] [address] 8-199-000-WNMI [phone]) (toll-free) www.rarediseases.org  The Dementias: Hope Through Research was jointly produced by the [employer] of Neurological Disorders and Stroke (NINDS) and the [employer] on Aging (NIA), both part of the [employer], the [employer] research agency--supporting scientific studies that turn discovery into health. NINDS is the nation's leading funder of research on the brain and nervous system. The NINDS mission is to reduce the burden of neurological disease. For more information and resources, visit ToledoAutomobile.co.uk [1] or call [phone]. NIA leads the federal government effort conducting and supporting research on aging and the health and well-being of older people. NIA's Alzheimer's Disease Education and Referral (ADEAR) Center offers information and publications on dementia and caregiving for families, caregivers, and professionals. For more information, visit CashCowGambling.be [2] or call [phone]. Also available from NIA are publications and information about Alzheimer's disease as well as the booklets Frontotemporal Disorders: Information for Patients, Families, and Caregivers and Lewy Body Dementia: Information for Patients, Families, and Professionals. Source URL:  VoiceZap.is    Dementia Dementia is a condition that affects the way the brain works. It often affects thinking and memory.  There are many types of dementia, including: Alzheimer's disease. This is the most common type. Vascular dementia. This type may happen due to a stroke.  Lewy body dementia. This type may happen to people who have Parkinson's disease. Frontotemporal dementia. This type is caused by damage to nerve cells in certain parts of the brain. Some people may have more than one type. What are the causes? Dementia is caused by damage to cells in the brain. Some causes that can't be reversed include: Having a condition that affects the blood vessels of the brain. This may be diabetes or heart disease. Changes to genes. Some causes that can be reversed or slowed down include: Injury to the brain due to: A growth called a tumor. A blood clot. Too much fluid in the brain. Taking certain medicines. An infection. Problems with your thyroid . Not having enough vitamin B12 in the body. Having a disease that causes your body's defense system, called the immune system, to attack healthy parts of your body. What are the signs or symptoms? Symptoms of dementia start slowly and get worse with time. They may include: Problems remembering events or people. Getting lost easily. Forgetting appointments or to pay bills. Having trouble taking a bath or putting clothes on. Having trouble planning and making meals. Having trouble speaking. Changes in behavior or mood. How is this diagnosed? Dementia may be diagnosed based on: Your symptoms and medical history. A physical exam. Tests. These may include: Tests to check your thinking and memory to see how your brain is working. Lab tests. You may have tests on your blood or pee (urine). Imaging tests, such as a CT scan, a PET scan, or an MRI. Genetic testing. This may be done if other  family members have had dementia. Your health care provider will talk with you and your family, friends, or caregivers about your history and symptoms. How is this treated? Treatment depends on the cause of the dementia and should start as soon as possible. It might include: Taking medicines for symptoms. Taking medicines to help control or slow down the dementia. Treating the cause of your dementia. Your provider can help you find support groups and other members of the health care team who can help with your care. Follow these instructions at home: Medicines Take medicines only as told by your provider. Use a pill organizer or pill reminder to help you keep track of your medicines. Avoid taking medicines for pain or for sleep. These can affect your thinking. Lifestyle Make healthy choices. Be active as told by your provider. Do not smoke, vape, or use products with nicotine or tobacco in them. If you need help quitting, talk with your provider. Do not drink alcohol. When you feel a lot of stress, do something that helps you relax. Your provider can give you tips. Spend time with other people. Make sure you get good sleep at night. These tips can help: Try not to take naps during the day. Keep your bedroom dark and cool. Do not exercise in the few hours before you go to bed. Do not have foods or drinks with caffeine at night. Eating and drinking Drink enough fluid to keep your pee pale yellow. Eat a healthy diet. General instructions  Talk with your provider to decide on: What things you need help with. What your safety needs are. Ask your provider if it's safe for you to drive. If told, wear a bracelet that tracks where you are or shows that you're a person with memory loss. Work with your family to make big legal or health decisions. This may include things like advance directives, medical power of attorney,  or a living will. Where to find more information Alzheimer's  Association: WesternTunes.it [employer] on Aging: BaseRingTones.pl World Health Organization: VisitDestination.com.br Contact a health care provider if: You have any new symptoms. Your symptoms get worse. You have problems with swallowing. Get help right away if: You feel very sad or feel like you may hurt yourself or others. You have thoughts about taking your own life. Your family members are worried about your safety. These symptoms may be an emergency. Take one of these steps right away: Go to your nearest emergency room. Call 911. Call the National Suicide Prevention Lifeline at 401-679-1715 or 988. Text the Crisis Text Line at (475)141-8426. This information is not intended to replace advice given to you by your health care provider. Make sure you discuss any questions you have with your health care provider. Document Revised: 11/24/2022 Document Reviewed: 04/11/2022 Elsevier Patient Education  2024 [employer].

## 2023-09-13 DIAGNOSIS — H40052 Ocular hypertension, left eye: Secondary | ICD-10-CM | POA: Diagnosis not present

## 2023-09-13 DIAGNOSIS — H40022 Open angle with borderline findings, high risk, left eye: Secondary | ICD-10-CM | POA: Diagnosis not present

## 2023-09-15 DIAGNOSIS — R062 Wheezing: Secondary | ICD-10-CM | POA: Diagnosis not present

## 2023-09-18 ENCOUNTER — Other Ambulatory Visit: Payer: Self-pay

## 2023-09-18 ENCOUNTER — Observation Stay (HOSPITAL_COMMUNITY)
Admission: EM | Admit: 2023-09-18 | Discharge: 2023-09-21 | Disposition: A | Discharge status: Home / Self Care | Attending: Internal Medicine | Admitting: Internal Medicine

## 2023-09-18 ENCOUNTER — Encounter (HOSPITAL_COMMUNITY): Payer: Self-pay | Admitting: Emergency Medicine

## 2023-09-18 ENCOUNTER — Emergency Department (HOSPITAL_COMMUNITY)

## 2023-09-18 DIAGNOSIS — G43909 Migraine, unspecified, not intractable, without status migrainosus: Secondary | ICD-10-CM | POA: Diagnosis present

## 2023-09-18 DIAGNOSIS — K581 Irritable bowel syndrome with constipation: Secondary | ICD-10-CM | POA: Diagnosis not present

## 2023-09-18 DIAGNOSIS — J9601 Acute respiratory failure with hypoxia: Secondary | ICD-10-CM

## 2023-09-18 DIAGNOSIS — I700 Atherosclerosis of aorta: Secondary | ICD-10-CM | POA: Diagnosis not present

## 2023-09-18 DIAGNOSIS — R062 Wheezing: Secondary | ICD-10-CM

## 2023-09-18 DIAGNOSIS — R0600 Dyspnea, unspecified: Principal | ICD-10-CM

## 2023-09-18 DIAGNOSIS — F32A Depression, unspecified: Secondary | ICD-10-CM | POA: Diagnosis present

## 2023-09-18 DIAGNOSIS — Z7901 Long term (current) use of anticoagulants: Secondary | ICD-10-CM | POA: Diagnosis not present

## 2023-09-18 DIAGNOSIS — F419 Anxiety disorder, unspecified: Secondary | ICD-10-CM | POA: Insufficient documentation

## 2023-09-18 DIAGNOSIS — K219 Gastro-esophageal reflux disease without esophagitis: Secondary | ICD-10-CM | POA: Diagnosis present

## 2023-09-18 DIAGNOSIS — R0602 Shortness of breath: Secondary | ICD-10-CM | POA: Diagnosis not present

## 2023-09-18 DIAGNOSIS — R918 Other nonspecific abnormal finding of lung field: Secondary | ICD-10-CM | POA: Diagnosis not present

## 2023-09-18 DIAGNOSIS — F0390 Unspecified dementia without behavioral disturbance: Secondary | ICD-10-CM | POA: Diagnosis not present

## 2023-09-18 DIAGNOSIS — R0982 Postnasal drip: Secondary | ICD-10-CM | POA: Diagnosis not present

## 2023-09-18 DIAGNOSIS — R079 Chest pain, unspecified: Secondary | ICD-10-CM | POA: Diagnosis not present

## 2023-09-18 DIAGNOSIS — Z87891 Personal history of nicotine dependence: Secondary | ICD-10-CM | POA: Diagnosis not present

## 2023-09-18 DIAGNOSIS — K5790 Diverticulosis of intestine, part unspecified, without perforation or abscess without bleeding: Secondary | ICD-10-CM | POA: Insufficient documentation

## 2023-09-18 DIAGNOSIS — G8929 Other chronic pain: Secondary | ICD-10-CM | POA: Diagnosis not present

## 2023-09-18 DIAGNOSIS — E785 Hyperlipidemia, unspecified: Secondary | ICD-10-CM | POA: Diagnosis not present

## 2023-09-18 DIAGNOSIS — Z79899 Other long term (current) drug therapy: Secondary | ICD-10-CM | POA: Insufficient documentation

## 2023-09-18 DIAGNOSIS — J441 Chronic obstructive pulmonary disease with (acute) exacerbation: Principal | ICD-10-CM | POA: Diagnosis present

## 2023-09-18 DIAGNOSIS — M545 Low back pain, unspecified: Secondary | ICD-10-CM | POA: Diagnosis not present

## 2023-09-18 DIAGNOSIS — R0902 Hypoxemia: Secondary | ICD-10-CM | POA: Diagnosis not present

## 2023-09-18 DIAGNOSIS — K5909 Other constipation: Secondary | ICD-10-CM | POA: Diagnosis present

## 2023-09-18 DIAGNOSIS — J42 Unspecified chronic bronchitis: Secondary | ICD-10-CM | POA: Diagnosis present

## 2023-09-18 LAB — BASIC METABOLIC PANEL WITH GFR
Anion gap: 11 (ref 5–15)
BUN: 13 mg/dL (ref 8–23)
CO2: 25 mmol/L (ref 22–32)
Calcium: 9.5 mg/dL (ref 8.9–10.3)
Chloride: 102 mmol/L (ref 98–111)
Creatinine, Ser: 0.94 mg/dL (ref 0.44–1.00)
GFR, Estimated: >60 mL/min (ref >60)
Glucose, Bld: 128 mg/dL — ABNORMAL HIGH (ref 70–99)
Potassium: 3.7 mmol/L (ref 3.5–5.1)
Sodium: 138 mmol/L (ref 135–145)

## 2023-09-18 LAB — CBC
HCT: 43.9 % (ref 36.0–46.0)
Hemoglobin: 13.9 g/dL (ref 12.0–15.0)
MCH: 28.1 pg (ref 26.0–34.0)
MCHC: 31.7 g/dL (ref 30.0–36.0)
MCV: 88.7 fL (ref 80.0–100.0)
Platelets: 230 K/uL (ref 150–400)
RBC: 4.95 MIL/uL (ref 3.87–5.11)
RDW: 13.4 % (ref 11.5–15.5)
WBC: 7.1 K/uL (ref 4.0–10.5)
nRBC: 0 % (ref 0.0–0.2)

## 2023-09-18 LAB — TROPONIN I (HIGH SENSITIVITY): Troponin I (High Sensitivity): 3 ng/L (ref <18)

## 2023-09-18 MED ORDER — IPRATROPIUM-ALBUTEROL 0.5-2.5 (3) MG/3ML IN SOLN
3.0000 mL | Freq: Once | RESPIRATORY_TRACT | Status: AC
  Administered 2023-09-18 (×2): 3 mL via RESPIRATORY_TRACT
  Filled 2023-09-18: qty 3

## 2023-09-18 MED ORDER — MAGNESIUM SULFATE 2 GM/50ML IV SOLN
2.0000 g | Freq: Once | INTRAVENOUS | Status: AC
  Administered 2023-09-18 (×2): 2 g via INTRAVENOUS
  Filled 2023-09-18: qty 50

## 2023-09-18 NOTE — ED Provider Notes (Signed)
 [facility] AT [facility] Provider Note   CSN: [id number] Arrival date & time: 09/18/23  2117     Patient presents with: Shortness of Breath and Chest Pain   Cheryl [name] is a 83 y.o. female.  Patient with past medical history significant for lung nodules, renal cyst, left lower leg edema, anxiety, GERD, history of bronchitis presents the emergency department due to cough, wheezing, and shortness of breath.  Patient and family at bedside both provide history.  Family at bedside states that the patient called her on Friday at approximate 2 PM stating she was very short of breath.  They went to an urgent care that day with a grossly unremarkable workup.  She used an albuterol  inhaler over the weekend.  Today she began to feel much more short of breath and had some chest tightness.  She went to her primary care office who prescribed a DuoNeb nebulizer.  Since being home she states that she continues to feel short of breath and felt significantly short of breath this evening.  She does not use oxygen at baseline.  Her home pulse oximeter showed a room air saturation of 85%.  Upon ED arrival patient was noted to have a room air saturation at rest of 89%.  She was placed on supplemental oxygen.  She also complains of chest tightness.  She denies nausea, vomiting, abdominal pain, radiation of symptoms.  She endorses some chronic left lower leg edema which has been there for a very long time it is not new.    Shortness of Breath Associated symptoms: chest pain   Chest Pain Associated symptoms: shortness of breath        Prior to Admission medications   Medication Sig Start Date End Date Taking? Authorizing Provider  albuterol  (VENTOLIN  HFA) 108 (90 Base) MCG/ACT inhaler Inhale 2 puffs into the lungs every 6 (six) hours as needed for wheezing or shortness of breath. 04/24/23   Booker Darice SAUNDERS, FNP  aspirin  81 MG tablet Take 81 mg by mouth daily.    [provider]  Cholecalciferol (VITAMIN D ) 1000 UNITS capsule Take 2,000 Units by mouth daily.    [provider]  cycloSPORINE  (RESTASIS ) 0.05 % ophthalmic emulsion Place 1 drop into both eyes 2 (two) times daily. 11/26/21   Alvan Dorothyann BIRCH, MD  escitalopram  (LEXAPRO ) 10 MG tablet Take 1 tablet (10 mg total) by mouth daily. 02/21/23   Bevin Bernice RAMAN, DO  Evolocumab  (REPATHA  SURECLICK) 140 MG/ML SOAJ Inject 140 mg into the skin every 14 (fourteen) days. 07/21/23   Raford Riggs, MD  EXCEDRIN MIGRAINE 250-250-65 MG tablet Take 2 tablets by mouth every 6 (six) hours as needed for migraine or headache.    [provider]  ipratropium (ATROVENT ) 0.03 % nasal spray Place 2 sprays into both nostrils every 12 (twelve) hours. 08/04/23   Kara Dorn NOVAK, MD  MAGNESIUM  PO Take 250 mg by mouth daily.    [provider]  Multiple Vitamin (MULTI-VITAMIN) tablet Take 1 tablet by mouth daily.    [provider]  nystatin  (MYCOSTATIN /NYSTOP ) powder Apply 1 Application topically 2 (two) times daily as needed. 02/21/23   Bevin Bernice RAMAN, DO  Omega-3 Fatty Acids (FISH OIL  PO) Take 2,400 mg by mouth daily. Softgel    [provider]  omeprazole  (PRILOSEC) 20 MG capsule Take 20 mg by mouth in the morning and at bedtime. Patient taking differently: Take 20 mg by mouth daily.  [provider]  polyethylene glycol (MIRALAX  / GLYCOLAX ) packet Take 17 g by mouth daily.    [provider]  PREMARIN  vaginal cream Place 1 applicator vaginally as needed (vaginal dryness).    [provider]  Probiotic TABS Take 1 tablet by mouth daily.    [provider]  rivastigmine  (EXELON ) 4.6 mg/24hr Place 4.6 mg onto the skin daily. 04/21/23   [provider]  traMADol  (ULTRAM ) 50 MG tablet Take 1 tablet (50 mg total) by mouth 2 (two) times daily as needed. for pain 02/21/23   Bevin Bernice RAMAN, DO  TURMERIC PO Take 2 Pieces by mouth daily.  500mg  gummies    [provider]  umeclidinium-vilanterol (ANORO ELLIPTA ) 62.5-25 MCG/ACT AEPB Inhale 1 puff into the lungs daily. 08/04/23   Kara Dorn NOVAK, MD    Allergies: Celebrex [celecoxib], Fluoxetine hcl, Fluoxetine hcl, Ambien  [zolpidem ], Amitriptyline , Aricept  [donepezil  hcl], Ciprofloxacin , Dexamethasone , Iodinated contrast media, Livalo  [pitavastatin ], Metrizamide, Nitroglycerin , Rosuvastatin , Simvastatin , Statins, Codeine sulfate, and Sulfonamide derivatives    Review of Systems  Respiratory:  Positive for shortness of breath.   Cardiovascular:  Positive for chest pain.    Updated Vital Signs BP 92/80   Pulse 79   Temp 98 F (36.7 C) (Oral)   Resp (!) 23   SpO2 92%   Physical Exam Vitals and nursing note reviewed.  Constitutional:      General: She is not in acute distress.    Appearance: She is well-developed.  HENT:     Head: Normocephalic and atraumatic.  Eyes:     Conjunctiva/sclera: Conjunctivae normal.  Cardiovascular:     Rate and Rhythm: Normal rate and regular rhythm.  Pulmonary:     Effort: Pulmonary effort is normal. No respiratory distress.     Breath sounds: Wheezing present.  Chest:     Chest wall: No tenderness.  Abdominal:     Palpations: Abdomen is soft.     Tenderness: There is no abdominal tenderness.  Musculoskeletal:        General: No swelling.     Cervical back: Neck supple.     Right lower leg: No edema.     Left lower leg: No edema.  Skin:    General: Skin is warm and dry.  Neurological:     Mental Status: She is alert.  Psychiatric:        Mood and Affect: Mood normal.     (all labs ordered are listed, but only abnormal results are displayed) Labs Reviewed  BASIC METABOLIC PANEL WITH GFR - Abnormal; Notable for the following components:      Result Value   Glucose, Bld 128 (*)    All other components within normal limits  CBC  TROPONIN I (HIGH SENSITIVITY)  TROPONIN I (HIGH SENSITIVITY)     EKG: None  Radiology: DG Chest 2 View Result Date: 09/18/2023 CLINICAL DATA:  Chest pain.  Low oxygen saturation. EXAM: CHEST - 2 VIEW COMPARISON:  CT 06/14/2023 FINDINGS: The heart is normal in size. Calcified mediastinal and left hilar lymph nodes, as seen on prior chest CT. Mild bronchial thickening. Pulmonary vasculature is normal. No consolidation, pleural effusion, or pneumothorax. No acute osseous abnormalities are seen. IMPRESSION: 1. Mild bronchial thickening. 2. Calcified mediastinal and left hilar lymph nodes consistent with prior granulomatous disease. Electronically Signed   By: Andrea Gasman M.D.   On: 09/18/2023 22:02     .Critical Care  Performed by: Logan Ubaldo NOVAK, PA-C Authorized by: Logan Ubaldo NOVAK,  PA-C   Critical care provider statement:    Critical care time (minutes):  30   Critical care time was exclusive of:  Separately billable procedures and treating other patients   Critical care was necessary to treat or prevent imminent or life-threatening deterioration of the following conditions:  Respiratory failure   Critical care was time spent personally by me on the following activities:  Development of treatment plan with patient or surrogate, discussions with consultants, evaluation of patient's response to treatment, examination of patient, ordering and review of laboratory studies, ordering and review of radiographic studies, ordering and performing treatments and interventions, pulse oximetry, re-evaluation of patient's condition and review of old charts   Care discussed with: admitting provider      Medications Ordered in the ED  ipratropium-albuterol  (DUONEB) 0.5-2.5 (3) MG/3ML nebulizer solution 3 mL (3 mLs Nebulization Given 09/18/23 2249)  magnesium  sulfate IVPB 2 g 50 mL (0 g Intravenous Stopped 09/19/23 0032)  albuterol  (PROVENTIL ) (2.5 MG/3ML) 0.083% nebulizer solution (5 mg/hr Nebulization Given 09/19/23 0501)                                     Medical Decision Making Amount and/or Complexity of Data Reviewed Labs: ordered. Radiology: ordered.  Risk Prescription drug management. Decision regarding hospitalization.   This patient presents to the ED for concern of shortness of breath with chest pain, this involves an extensive number of treatment options, and is a complaint that carries with it a high risk of complications and morbidity.  The differential diagnosis includes new onset COPD, bronchitis, pneumonia, ACS, others   Co morbidities / Chronic conditions that complicate the patient evaluation  History of bronchitis   Additional history obtained:  Additional history obtained from EMR External records from outside source obtained and reviewed including recent results from chest CT without contrast media from May, showing prior granulomatous disease   Lab Tests:  I Ordered, and personally interpreted labs.  The pertinent results include:  Troponin of 3   Imaging Studies ordered:  I ordered imaging studies including chest x-ray I independently visualized and interpreted imaging which showed  1. Mild bronchial thickening.  2. Calcified mediastinal and left hilar lymph nodes consistent with  prior granulomatous disease.   I agree with the radiologist interpretation   Cardiac Monitoring: / EKG:  The patient was maintained on a cardiac monitor.  I personally viewed and interpreted the cardiac monitored which showed an underlying rhythm of: Sinus rhythm   Problem List / ED Course / Critical interventions / Medication management   I ordered medication including DuoNeb, magnesium  Reevaluation of the patient after these medicines showed that the patient had improvement in wheezing but continued to have oxygen requirement I have reviewed the patients home medicines and have made adjustments as needed   Consultations Obtained:  I requested consultation with the hospitalist, Dr.Rathore, and discussed lab and  imaging findings as well as pertinent plan - they recommend: admission   Social Determinants of Health:  Patient is a former smoker, reports approximately a 10-pack-year history   Test / Admission - Considered:  Patient continued to have wheezing after DuoNeb and mag sulfate.  Patient states that it has been recommended to avoid steroids if possible due to possible effects on intraocular pressure so I have not administer Solu-Medrol at this time.  She continues to have a new oxygen requirement.  She is satting at 70  to 93% on nasal cannula at 2 L.  With new oxygen requirement and continued mild wheezing I feel the patient would benefit from admission for further management.      Final diagnoses:  Dyspnea, unspecified type  Wheezing    ED Discharge Orders     None          Logan Ubaldo KATHEE DEVONNA 09/19/23 0555    Palumbo, April, MD 09/19/23 [phone]

## 2023-09-18 NOTE — ED Triage Notes (Signed)
 Pt arrives POV w/ family w/ c/o SHOB, chest tightness, & low O2. Was 85% at home. 89% in triage. Was given breathing tx at home w/ no relief. Had chest xray done today at pcp w/ no findings.

## 2023-09-19 DIAGNOSIS — J441 Chronic obstructive pulmonary disease with (acute) exacerbation: Secondary | ICD-10-CM | POA: Diagnosis not present

## 2023-09-19 LAB — TROPONIN I (HIGH SENSITIVITY): Troponin I (High Sensitivity): 2 ng/L (ref <18)

## 2023-09-19 MED ORDER — SODIUM CHLORIDE 0.9 % IV SOLN
500.0000 mg | INTRAVENOUS | Status: AC
  Administered 2023-09-19 (×2): 500 mg via INTRAVENOUS
  Filled 2023-09-19: qty 5

## 2023-09-19 MED ORDER — HEPARIN SODIUM (PORCINE) 5000 UNIT/ML IJ SOLN
5000.0000 [IU] | Freq: Three times a day (TID) | INTRAMUSCULAR | Status: DC
  Administered 2023-09-19 – 2023-09-21 (×11): 5000 [IU] via SUBCUTANEOUS
  Filled 2023-09-19 (×7): qty 1

## 2023-09-19 MED ORDER — CYCLOSPORINE 0.05 % OP EMUL
1.0000 [drp] | Freq: Two times a day (BID) | OPHTHALMIC | Status: DC
  Administered 2023-09-19 – 2023-09-21 (×7): 1 [drp] via OPHTHALMIC
  Filled 2023-09-19 (×4): qty 30

## 2023-09-19 MED ORDER — ESTROGENS CONJUGATED 0.625 MG/GM VA CREA: 1.0000 | TOPICAL_CREAM | VAGINAL | Status: DC | PRN

## 2023-09-19 MED ORDER — RIVASTIGMINE 9.5 MG/24HR TD PT24
9.5000 mg | MEDICATED_PATCH | Freq: Every day | TRANSDERMAL | Status: DC
  Administered 2023-09-19 – 2023-09-21 (×5): 9.5 mg via TRANSDERMAL
  Filled 2023-09-19 (×3): qty 1

## 2023-09-19 MED ORDER — PANTOPRAZOLE SODIUM 40 MG PO TBEC
40.0000 mg | DELAYED_RELEASE_TABLET | Freq: Every day | ORAL | Status: DC
  Administered 2023-09-19 (×2): 40 mg via ORAL
  Filled 2023-09-19: qty 1

## 2023-09-19 MED ORDER — TRAMADOL HCL 50 MG PO TABS: 50.0000 mg | ORAL_TABLET | Freq: Two times a day (BID) | ORAL | Status: DC | PRN

## 2023-09-19 MED ORDER — UMECLIDINIUM-VILANTEROL 62.5-25 MCG/ACT IN AEPB
1.0000 | INHALATION_SPRAY | Freq: Every day | RESPIRATORY_TRACT | Status: DC
  Administered 2023-09-20 – 2023-09-21 (×3): 1 via RESPIRATORY_TRACT
  Filled 2023-09-19: qty 14

## 2023-09-19 MED ORDER — ESCITALOPRAM OXALATE 10 MG PO TABS
10.0000 mg | ORAL_TABLET | Freq: Every day | ORAL | Status: DC
  Administered 2023-09-19 – 2023-09-21 (×5): 10 mg via ORAL
  Filled 2023-09-19 (×3): qty 1

## 2023-09-19 MED ORDER — ALBUTEROL SULFATE (2.5 MG/3ML) 0.083% IN NEBU
2.5000 mg | INHALATION_SOLUTION | RESPIRATORY_TRACT | Status: DC | PRN
  Administered 2023-09-19 (×2): 2.5 mg via RESPIRATORY_TRACT
  Filled 2023-09-19 (×2): qty 3

## 2023-09-19 MED ORDER — AZITHROMYCIN 250 MG PO TABS
500.0000 mg | ORAL_TABLET | Freq: Every day | ORAL | Status: DC
  Administered 2023-09-20 (×2): 500 mg via ORAL
  Filled 2023-09-19 (×2): qty 2

## 2023-09-19 MED ORDER — ALBUTEROL SULFATE (2.5 MG/3ML) 0.083% IN NEBU
5.0000 mg/h | INHALATION_SOLUTION | Freq: Once | RESPIRATORY_TRACT | Status: AC
  Administered 2023-09-19 (×2): 5 mg/h via RESPIRATORY_TRACT
  Filled 2023-09-19: qty 3

## 2023-09-19 MED ORDER — ASPIRIN 81 MG PO TBEC
81.0000 mg | DELAYED_RELEASE_TABLET | Freq: Every day | ORAL | Status: DC
  Administered 2023-09-19 – 2023-09-21 (×5): 81 mg via ORAL
  Filled 2023-09-19 (×3): qty 1

## 2023-09-19 MED ORDER — ALBUTEROL SULFATE (2.5 MG/3ML) 0.083% IN NEBU
2.5000 mg | INHALATION_SOLUTION | Freq: Four times a day (QID) | RESPIRATORY_TRACT | Status: DC
  Administered 2023-09-19 – 2023-09-21 (×12): 2.5 mg via RESPIRATORY_TRACT
  Filled 2023-09-19 (×8): qty 3

## 2023-09-19 MED ORDER — GUAIFENESIN 100 MG/5ML PO LIQD
10.0000 mL | ORAL | Status: DC | PRN
  Administered 2023-09-20 – 2023-09-21 (×3): 10 mL via ORAL
  Filled 2023-09-19 (×3): qty 10

## 2023-09-19 NOTE — Plan of Care (Signed)

## 2023-09-19 NOTE — H&P (Signed)
 History and Physical    Cheryl [name] FMW:[id number] DOB: 1940/08/08 DOA: 09/18/2023  PCP: Chet Mad, DO   Chief Complaint: Shortness of breath  HPI: Cheryl [name] is a 83 y.o. female who presents with worsening respiratory status and hypoxia from baseline over the past week to 2 weeks.  She denies any recent travel sick contacts or recent illnesses herself.  Family bedside indicates she has been doing somewhat poorly over the past 48-month timeframe after COVID infection with no real resolution since that time.  Patient follows closely with pulmonology who images to evaluate her last week with no medication changes.  Her regimen is complicated by recommendations from ophthalmology to avoid steroids due to elevated ocular pressure and risk of steroid-induced glaucoma. It is unclear if this is in regards to chronic steroids only or if also includes acute bolus systemic steroids or topical/inhaled steroids.  Patient has a known history of advanced dementia, anxiety/depression, diverticulosis, IBS, chronic constipation, unspecified granulomatous disease history, multiple lung nodules, renal cysts, GERD, obesity, migraine without aura, iron deficiency anemia and chronic bronchitis  ED Course: Presented with hypoxia into the low 80s, imaging at intake shows bilateral bronchial thickening, patient received DuoNebs, magnesium  with moderate improvement in symptoms, hospitalist for admission  Review of Systems: As per HPI otherwise denies nausea vomiting diarrhea constipation headache fevers chills chest pain.   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 Will attempt to confirm that this is in regards to chronic steroids versus topical/inhaled steroids as patient would benefit from inhaled steroids at this time - Continue to wean oxygen as appropriate, at baseline on room air at home -ambulatory oxygen screen daily - Daughter indicates patient's respiratory status has declined over the past 8 months(was diagnosed with COVID at that time) -concern for evolving fibrosis given history of granulomatous disease - Pulmonary nodules being followed in the outpatient setting - Patient is due for PFTs, planned for September to further evaluate disease process  Dementia, advanced - Daughter at bedside for much of history, would defer to her for further medical management as default POA - Patient confirmed DNR with daughter in room - Resume home medications with rivastigmine   IBS, diverticulosis, chronic constipation - No current issues, supportive care as needed  Anxiety/depression - Continue home medications including escitalopram , rivastigmine  as above, no.  Medications noted at this time -Would avoid CNS depressants given respiratory status and age  Migraine without aura -on Excedrin Migraine at home, no abortive medication noted on med rec GERD-continue supportive care Iron deficiency anemia,?  Resolved - currently within normal limits; most recent iron panel 2024 was within normal limits Obesity There is no height or weight on file to calculate BMI.   DVT prophylaxis: Heparin  Code Status: DNR/DNI -confirmed with daughter at bedside Family Communication: Daughter at bedside Status is: Inpatient  Dispo: The patient is from: Home              Anticipated d/c is to: Home              Anticipated d/c date is: 24 to 48 hours  Patient currently not medically stable for discharge given  ongoing need for supplemental oxygen and evaluation as above  Consultants:  None  Procedures:  None   Past Medical History:  Diagnosis Date   Anemia    Anxiety    d/t recent family issues   Arthritis    Bone spur    Carotid stenosis, bilateral 06/06/2019   Chronic back pain    stenosis and OA   Dementia (HCC)    Depression    takes Paxil  daily   Diverticulosis    Dizzy    occasionally   Encephalitis    at 9 months ago    Fatty liver    has had 2 Hep shots and the final one is in June 18   GERD (gastroesophageal reflux disease)    takes Omeprazole  daily   Hepatitis 2012   History of bronchitis    20-25 yrs ago   History of colon polyps    precancerous   History of shingles    Hyperlipidemia    takes Simvastatin  daily   IBS (irritable bowel syndrome)    more on side of constipation-takes Miralax  daily   Insomnia    takes Melatonin and Ambien  nightly   Internal hemorrhoids    Joint pain    Joint swelling    Lower leg edema 09/21/2021   Lung nodules    calcified    Migraine headache    migraines-thinks coming from neck.Last one1/28/18   Orthostatic hypotension 09/21/2021   Pneumonia    hx of-20 yrs ago-=-walking   Renal cyst    left   Uterine cancer (HCC) 1973    Past Surgical History:  Procedure Laterality Date   APPENDECTOMY  1966   cataracts     bilateral   COLONOSCOPY  09/04/2020   EUS  02/10/2011   Procedure: UPPER ENDOSCOPIC ULTRASOUND (EUS) RADIAL;  Surgeon: Toribio Cedar, MD;  Location: [facility];  Service: Endoscopy;  Laterality: N/A;  radial linear    LUMBAR FUSION  03/2016   L4-5, Dr. Epifanio.   RECTOCELE REPAIR  2011   ROTATOR CUFF REPAIR  2009   left   TONSILLECTOMY     UPPER GASTROINTESTINAL ENDOSCOPY     VAGINAL HYSTERECTOMY  1973     reports that she quit smoking about 30 years ago. Her smoking use included cigarettes. She has never used smokeless tobacco. She reports that she does not drink alcohol and does not use  drugs.  Allergies  Allergen Reactions   Celebrex [Celecoxib] Itching, Nausea Only and Other (See Comments)    Gas, hair loss also   Fluoxetine Hcl Shortness Of Breath and Itching   Fluoxetine Hcl Itching and Shortness Of Breath    REACTION: itching,SOB  REACTION: itching,SOB   Ambien  [Zolpidem ] Other (See Comments)    Headaches    Amitriptyline  Other (See Comments)    Dry mouth    Aricept  [Donepezil  Hcl] Other (See Comments)    Nightmares    Ciprofloxacin  Other (See Comments)    Muscle weakness   Dexamethasone  Other (See Comments)    *High risk for increased intra-ocular pressure -> steroid-induced glaucoma   Iodinated Contrast Media Itching and Other (See Comments)    MultiHance  (probably not Multilance)  Multilance.   Livalo  [Pitavastatin ] Other (See Comments)    Myalgia    Metrizamide Itching and Other (See Comments)    AMIPAQUE (also allergic to MultiHance  (probably not Multilance)   Nitroglycerin  Nausea Only and Other (See Comments)  Dizziness and sweating, also   Rosuvastatin  Other (See Comments)    Body aches and cramping   Simvastatin  Other (See Comments)    Muscle aches and headaches   Statins Other (See Comments)    Could NOT tolerate- Made me feel like I had the flu   Codeine Sulfate Itching    REACTION: itching  REACTION: itching   Sulfonamide Derivatives Nausea Only    REACTION: Nausea    Family History  Problem Relation Age of Onset   Kidney disease Mother    Kidney failure Mother        med induced   Rheum arthritis Mother    Memory loss Mother    Heart disease Father    Alcohol abuse Father    Lung cancer Father        smoker   Depression Brother    Crohn's disease Brother    Alcohol abuse Brother    Heart failure Daughter    Lung cancer Son    HIV Son    Heart disease Other        Paternal family    Malignant hyperthermia Neg Hx    Colon cancer Neg Hx    Esophageal cancer Neg Hx    Rectal cancer Neg Hx    Stomach cancer Neg  Hx    Migraines Neg Hx     Prior to Admission medications   Medication Sig Start Date End Date Taking? Authorizing Provider  albuterol  (VENTOLIN  HFA) 108 (90 Base) MCG/ACT inhaler Inhale 2 puffs into the lungs every 6 (six) hours as needed for wheezing or shortness of breath. 04/24/23   Booker Darice SAUNDERS, FNP  aspirin  81 MG tablet Take 81 mg by mouth daily.    [provider]  Cholecalciferol (VITAMIN D ) 1000 UNITS capsule Take 2,000 Units by mouth daily.    [provider]  cycloSPORINE  (RESTASIS ) 0.05 % ophthalmic emulsion Place 1 drop into both eyes 2 (two) times daily. 11/26/21   Alvan Dorothyann BIRCH, MD  escitalopram  (LEXAPRO ) 10 MG tablet Take 1 tablet (10 mg total) by mouth daily. 02/21/23   Bevin Bernice RAMAN, DO  Evolocumab  (REPATHA  SURECLICK) 140 MG/ML SOAJ Inject 140 mg into the skin every 14 (fourteen) days. 07/21/23   Raford Riggs, MD  EXCEDRIN MIGRAINE 250-250-65 MG tablet Take 2 tablets by mouth every 6 (six) hours as needed for migraine or headache.    [provider]  ipratropium (ATROVENT ) 0.03 % nasal spray Place 2 sprays into both nostrils every 12 (twelve) hours. 08/04/23   Kara Dorn NOVAK, MD  MAGNESIUM  PO Take 250 mg by mouth daily.    [provider]  Multiple Vitamin (MULTI-VITAMIN) tablet Take 1 tablet by mouth daily.    [provider]  nystatin  (MYCOSTATIN /NYSTOP ) powder Apply 1 Application topically 2 (two) times daily as needed. 02/21/23   Bevin Bernice RAMAN, DO  Omega-3 Fatty Acids (FISH OIL  PO) Take 2,400 mg by mouth daily. Softgel    [provider]  omeprazole  (PRILOSEC) 20 MG capsule Take 20 mg by mouth in the morning and at bedtime. Patient taking differently: Take 20 mg by mouth daily.    [provider]  polyethylene glycol (MIRALAX  / GLYCOLAX ) packet Take 17 g by mouth daily.    [provider]  PREMARIN  vaginal cream Place 1 applicator vaginally as needed (vaginal dryness).    [provider]  Probiotic TABS Take 1 tablet by mouth daily.    [provider]  rivastigmine  (EXELON ) 4.6 mg/24hr Place 4.6 mg onto the skin daily. 04/21/23   [provider]  traMADol  (ULTRAM ) 50 MG tablet Take 1 tablet (50 mg total) by mouth 2 (two) times daily as needed. for pain 02/21/23   Bevin Bernice RAMAN, DO  TURMERIC PO Take 2 Pieces by mouth daily. 500mg  gummies    [provider]  umeclidinium-vilanterol (ANORO ELLIPTA ) 62.5-25 MCG/ACT AEPB Inhale 1 puff into the lungs daily. 08/04/23   Kara Dorn NOVAK, MD    Physical Exam: Vitals:   09/19/23 0600 09/19/23 0935 09/19/23 1024 09/19/23 1115  BP: (!) 121/50   114/63  Pulse: 81   75  Resp: (!) 23   (!) 22  Temp:  98.2 F (36.8 C)  98.5 F (36.9 C)  TempSrc:  Oral  Oral  SpO2: 90%  94% 97%    Constitutional: NAD, calm, comfortable Vitals:   09/19/23 0600 09/19/23 0935 09/19/23 1024 09/19/23 1115  BP: (!) 121/50   114/63  Pulse: 81   75  Resp: (!) 23   (!) 22  Temp:  98.2 F (36.8 C)  98.5 F (36.9 C)  TempSrc:  Oral  Oral  SpO2: 90%  94% 97%   General:  Pleasantly resting in bed, No acute distress. HEENT:  Normocephalic atraumatic.  Sclerae nonicteric, noninjected.  Extraocular movements intact bilaterally. Neck:  Without mass or deformity.  Trachea is midline. Lungs:  Clear to auscultate bilaterally without rhonchi, wheeze, or rales. Heart:  Regular rate and rhythm.  Without murmurs, rubs, or gallops. Abdomen:  Soft, nontender, nondistended.  Without guarding or rebound. Extremities: Without cyanosis, clubbing, edema, or obvious deformity. Vascular:  Dorsalis pedis and posterior tibial pulses palpable bilaterally. Skin:  Warm and dry, no erythema, no ulcerations.  Labs on Admission: I have personally reviewed following labs and imaging studies  CBC: Recent Labs  Lab 09/18/23 2136  WBC 7.1  HGB 13.9  HCT 43.9  MCV 88.7  PLT 230   Basic Metabolic Panel: Recent Labs  Lab  09/18/23 2136  NA 138  K 3.7  CL 102  CO2 25  GLUCOSE 128*  BUN 13  CREATININE 0.94  CALCIUM  9.5   GFR: CrCl cannot be calculated (Unknown ideal weight.). Liver Function Tests: No results for input(s): AST, ALT, ALKPHOS, BILITOT, PROT, ALBUMIN in the last 168 hours. No results for input(s): LIPASE, AMYLASE in the last 168 hours. No results for input(s): AMMONIA in the last 168 hours. Coagulation Profile: No results for input(s): INR, PROTIME in the last 168 hours. Cardiac Enzymes: No results for input(s): CKTOTAL, CKMB, CKMBINDEX, TROPONINI in the last 168 hours. BNP (last 3 results) No results for input(s): PROBNP in the last 8760 hours. HbA1C: No results for input(s): HGBA1C in the last 72 hours. CBG: No results for input(s): GLUCAP in the last 168 hours. Lipid Profile: No results for input(s): CHOL, HDL, LDLCALC, TRIG, CHOLHDL, LDLDIRECT in the last 72 hours. Thyroid  Function Tests: No results for input(s): TSH, T4TOTAL, FREET4, T3FREE, THYROIDAB in the last 72 hours. Anemia Panel: No results for input(s): VITAMINB12, FOLATE, FERRITIN, TIBC, IRON, RETICCTPCT in the last 72 hours. Urine analysis:    Component Value Date/Time   COLORURINE DARK YELLOW 12/22/2021 0000   APPEARANCEUR CLEAR 12/22/2021 0000   LABSPEC 1.005 12/22/2021 0000   PHURINE 6.5 12/22/2021 0000   GLUCOSEU NEGATIVE 12/22/2021 0000   GLUCOSEU NEG mg/dL 98/86/7987 9971   HGBUR NEGATIVE 12/22/2021 0000   HGBUR small 06/05/2007 1537   BILIRUBINUR small (  A) 03/29/2022 1459   BILIRUBINUR small 06/23/2021 1417   KETONESUR negative 03/29/2022 1459   KETONESUR NEGATIVE 12/22/2021 0000   PROTEINUR NEGATIVE 12/22/2021 0000   UROBILINOGEN 0.2 03/29/2022 1459   UROBILINOGEN 1.0 01/17/2013 2156   NITRITE Negative 03/29/2022 1459   NITRITE POSITIVE (A) 12/22/2021 0000   LEUKOCYTESUR Small (1+) (A) 03/29/2022 1459   LEUKOCYTESUR 1+ (A)  12/22/2021 0000    Radiological Exams on Admission: DG Chest 2 View Result Date: 09/18/2023 CLINICAL DATA:  Chest pain.  Low oxygen saturation. EXAM: CHEST - 2 VIEW COMPARISON:  CT 06/14/2023 FINDINGS: The heart is normal in size. Calcified mediastinal and left hilar lymph nodes, as seen on prior chest CT. Mild bronchial thickening. Pulmonary vasculature is normal. No consolidation, pleural effusion, or pneumothorax. No acute osseous abnormalities are seen. IMPRESSION: 1. Mild bronchial thickening. 2. Calcified mediastinal and left hilar lymph nodes consistent with prior granulomatous disease. Electronically Signed   By: Andrea Gasman M.D.   On: 09/18/2023 22:02   EKG: Independently reviewed.   Elsie JAYSON Montclair DO [facility] For contact please use secure messenger on Epic  If 7PM-7AM, please contact night-coverage located on [email or web address]   09/19/2023, 12:13 PM

## 2023-09-19 NOTE — ED Notes (Signed)
Bedside commode placed in room for pt. 

## 2023-09-19 NOTE — ED Notes (Signed)
 Pt independently uses the bedside commode

## 2023-09-20 DIAGNOSIS — J441 Chronic obstructive pulmonary disease with (acute) exacerbation: Secondary | ICD-10-CM | POA: Diagnosis not present

## 2023-09-20 DIAGNOSIS — F419 Anxiety disorder, unspecified: Secondary | ICD-10-CM

## 2023-09-20 DIAGNOSIS — K219 Gastro-esophageal reflux disease without esophagitis: Secondary | ICD-10-CM

## 2023-09-20 DIAGNOSIS — K5909 Other constipation: Secondary | ICD-10-CM

## 2023-09-20 DIAGNOSIS — J9601 Acute respiratory failure with hypoxia: Secondary | ICD-10-CM | POA: Diagnosis not present

## 2023-09-20 DIAGNOSIS — F32A Depression, unspecified: Secondary | ICD-10-CM

## 2023-09-20 DIAGNOSIS — G43809 Other migraine, not intractable, without status migrainosus: Secondary | ICD-10-CM

## 2023-09-20 DIAGNOSIS — F03B4 Unspecified dementia, moderate, with anxiety: Secondary | ICD-10-CM

## 2023-09-20 LAB — BASIC METABOLIC PANEL WITH GFR
Anion gap: 9 (ref 5–15)
BUN: 11 mg/dL (ref 8–23)
CO2: 24 mmol/L (ref 22–32)
Calcium: 9.2 mg/dL (ref 8.9–10.3)
Chloride: 106 mmol/L (ref 98–111)
Creatinine, Ser: 0.74 mg/dL (ref 0.44–1.00)
GFR, Estimated: >60 mL/min (ref >60)
Glucose, Bld: 106 mg/dL — ABNORMAL HIGH (ref 70–99)
Potassium: 3.9 mmol/L (ref 3.5–5.1)
Sodium: 139 mmol/L (ref 135–145)

## 2023-09-20 LAB — CBC
HCT: 42.8 % (ref 36.0–46.0)
Hemoglobin: 13.7 g/dL (ref 12.0–15.0)
MCH: 28.6 pg (ref 26.0–34.0)
MCHC: 32 g/dL (ref 30.0–36.0)
MCV: 89.4 fL (ref 80.0–100.0)
Platelets: 235 K/uL (ref 150–400)
RBC: 4.79 MIL/uL (ref 3.87–5.11)
RDW: 13.4 % (ref 11.5–15.5)
WBC: 6.9 K/uL (ref 4.0–10.5)
nRBC: 0 % (ref 0.0–0.2)

## 2023-09-20 MED ORDER — PREDNISONE 5 MG PO TABS
10.0000 mg | ORAL_TABLET | Freq: Every day | ORAL | Status: DC
  Administered 2023-09-20 – 2023-09-21 (×3): 10 mg via ORAL
  Filled 2023-09-20 (×2): qty 2

## 2023-09-20 MED ORDER — PANTOPRAZOLE SODIUM 40 MG PO TBEC
40.0000 mg | DELAYED_RELEASE_TABLET | Freq: Two times a day (BID) | ORAL | Status: DC
  Administered 2023-09-20 – 2023-09-21 (×5): 40 mg via ORAL
  Filled 2023-09-20 (×3): qty 1

## 2023-09-20 MED ORDER — GUAIFENESIN ER 600 MG PO TB12
1200.0000 mg | ORAL_TABLET | Freq: Two times a day (BID) | ORAL | Status: DC
  Administered 2023-09-20 – 2023-09-21 (×5): 1200 mg via ORAL
  Filled 2023-09-20 (×3): qty 2

## 2023-09-20 MED ORDER — SALINE SPRAY 0.65 % NA SOLN
1.0000 | Freq: Every day | NASAL | Status: DC
  Administered 2023-09-20 – 2023-09-21 (×3): 1 via NASAL
  Filled 2023-09-20: qty 44

## 2023-09-20 MED ORDER — MAGNESIUM SULFATE 2 GM/50ML IV SOLN: 2.0000 g | Freq: Once | INTRAVENOUS | Status: DC

## 2023-09-20 MED ORDER — LORATADINE 10 MG PO TABS
10.0000 mg | ORAL_TABLET | Freq: Every day | ORAL | Status: DC
  Administered 2023-09-20 – 2023-09-21 (×3): 10 mg via ORAL
  Filled 2023-09-20 (×2): qty 1

## 2023-09-20 NOTE — Plan of Care (Signed)
  Problem: Nutrition: Goal: Adequate nutrition will be maintained Outcome: Progressing   Problem: Pain Managment: Goal: General experience of comfort will improve and/or be controlled Outcome: Progressing   Problem: Education: Goal: Knowledge of disease or condition will improve Outcome: Progressing   Problem: Respiratory: Goal: Levels of oxygenation will improve Outcome: Progressing

## 2023-09-20 NOTE — Progress Notes (Signed)
   09/20/23 1342  TOC Brief Assessment  Insurance and Status Reviewed  Patient has primary care physician Yes  Home environment has been reviewed home alone in apartment; daughter in same apartment community  Prior level of function: independent  Social Drivers of Health Review SDOH reviewed no interventions necessary  Readmission risk has been reviewed Yes  Transition of care needs no transition of care needs at this time

## 2023-09-20 NOTE — Progress Notes (Signed)
 PROGRESS NOTE    Cheryl [name]  FMW:[id number] DOB: [birth year] DOA: 09/18/2023 PCP: [name], [name], DO (Confirm with patient/family/NH records and if not entered, this HAS to be entered at [facility] point of entry. No PCP if truly none.)   Chief Complaint  Patient presents with   Shortness of Breath   Chest Pain    Brief Narrative:  Patient is a pleasant 83 year old female history of reactive airway disease (asthma versus COPD), dementia, depression, GERD presenting to the ED with worsening respiratory status over the past 2 weeks and worsening hypoxia from baseline.  Patient noted to have been doing poorly after COVID infection over the past 8 months with no significant resolution.  Patient admitted and being treated for acute COPD exacerbation versus asthma exacerbation.  Patient also noted with concerns for steroid use due to concern for steroid-induced glaucoma from her ophthalmologist.  PCCM/pulmonary consulted.   Assessment & Plan:   Principal Problem:   COPD exacerbation (HCC) Active Problems:   Acute hypoxic respiratory failure (HCC)   Anxiety and depression   CONSTIPATION, CHRONIC   Migraine headache   Dementia (HCC)   Gastroesophageal reflux disease without esophagitis   Chronic bronchitis (HCC)  #1 acute hypoxic respiratory failure secondary to acute COPD exacerbation versus asthma exacerbation/complicated by granulomatous disease history/chronic bronchitis/prior COVID infection with questionable ongoing fibrosis/multiple lung nodules -Patient admitted with concerns for COPD exacerbation versus asthma exacerbation. --Patient has been educated to avoid steroids by her ophthalmologist due to risk of steroid-induced glaucoma. Will attempt to confirm that this is in regards to chronic steroids versus topical/inhaled steroids as patient would benefit from inhaled steroids at this time  -It is noted per admitting physician the daughter indicates that patient respiratory status  has declined over the past 8 months since being diagnosed with COVID and concern for evolving fibrosis given history of [place] disease.   - Patient with some clinical improvement.   -Continue scheduled albuterol  nebs, azithromycin , Anoro Ellipta . -Placed on Claritin , PPI, saline nasal spray, Mucinex . -Pulmonary consulted to assess the patient and discussed with patient and patient in agreement with starting 10 mg of prednisone  daily x 5 days. -Check ambulatory sats. -Appreciate pulmonary input and recommendations.  2.  Advanced dementia -It is noted at daughter gave most of the history and patient deferring to daughter for further medical management as default POA. - Daughter noted to have confirmed DNR. - Continue rivastigmine . - Outpatient follow-up.  3.  IBS, diverticulosis, chronic constipation -Stable.  4.  Depression/anxiety -Continue home regimen of rivastigmine , escitalopram .  5.  Migraine without aura -Stable. - No to be on Excedrin Migraine at home.  6.  GERD -PPI.    DVT prophylaxis: Heparin  Code Status: DNR Family Communication: Updated patient and daughters at bedside. Disposition: Likely home tomorrow if continued clinical improvement.  Status is: Observation The patient remains OBS appropriate and will d/c before 2 midnights.   Consultants:  PCCM: Dr. Maryalice 09/20/2023  Procedures:  Chest x-ray 09/18/2023   Antimicrobials:  Anti-infectives (From admission, onward)    Start     Dose/Rate Route Frequency Ordered Stop   09/20/23 0900  azithromycin  (ZITHROMAX ) tablet 500 mg       Placed in Followed by Linked Group   500 mg Oral Daily 09/19/23 0851 09/22/23 0959   09/19/23 0900  azithromycin  (ZITHROMAX ) 500 mg in sodium chloride  0.9 % 250 mL IVPB       Placed in Followed by Linked Group   500 mg 250 mL/hr over  60 Minutes Intravenous Every 24 hours 09/19/23 0851 09/19/23 1900         Subjective: Patient sitting up in chair.  Overall  feels better than she did on admission.  Daughter is at bedside.  Denies any chest pain.  No significant shortness of breath.  Diffuse wheezing has improved.  Per daughter patient with complaints of loose stool and feeling secondary to antibiotics.  Objective: Vitals:   09/20/23 0530 09/20/23 0826 09/20/23 1337 09/20/23 1409  BP: (!) 116/48  110/65   Pulse: 82  78   Resp: 18  18   Temp: 98.5 F (36.9 C)  98.5 F (36.9 C)   TempSrc: Oral  Oral   SpO2: 92% 91% 95% 95%    Intake/Output Summary (Last 24 hours) at 09/20/2023 1844 Last data filed at 09/20/2023 1800 Gross per 24 hour  Intake 1200 ml  Output --  Net 1200 ml   There were no vitals filed for this visit.  Examination:  General exam: Appears calm and comfortable  Respiratory system: Minimal expiratory wheezing.  No crackles, no rhonchi, no coarse breath sounds.  Speaking in full sentences.  Normal respiratory effort.  Cardiovascular system: S1 & S2 heard, RRR. No JVD, murmurs, rubs, gallops or clicks. No pedal edema. Gastrointestinal system: Abdomen is nondistended, soft and nontender. No organomegaly or masses felt. Normal bowel sounds heard. Central nervous system: Alert and oriented. No focal neurological deficits. Extremities: Symmetric 5 x 5 power. Skin: No rashes, lesions or ulcers Psychiatry: Judgement and insight appear normal. Mood & affect appropriate.     Data Reviewed: I have personally reviewed following labs and imaging studies  CBC: Recent Labs  Lab 09/18/23 2136 09/20/23 0431  WBC 7.1 6.9  HGB 13.9 13.7  HCT 43.9 42.8  MCV 88.7 89.4  PLT 230 235    Basic Metabolic Panel: Recent Labs  Lab 09/18/23 2136 09/20/23 0431  NA 138 139  K 3.7 3.9  CL 102 106  CO2 25 24  GLUCOSE 128* 106*  BUN 13 11  CREATININE 0.94 0.74  CALCIUM  9.5 9.2    GFR: CrCl cannot be calculated (Unknown ideal weight.).  Liver Function Tests: No results for input(s): AST, ALT, ALKPHOS, BILITOT, PROT,  ALBUMIN in the last 168 hours.  CBG: No results for input(s): GLUCAP in the last 168 hours.   No results found for this or any previous visit (from the past 240 hours).       Radiology Studies: DG Chest 2 View Result Date: 09/18/2023 CLINICAL DATA:  Chest pain.  Low oxygen saturation. EXAM: CHEST - 2 VIEW COMPARISON:  CT 06/14/2023 FINDINGS: The heart is normal in size. Calcified mediastinal and left hilar lymph nodes, as seen on prior chest CT. Mild bronchial thickening. Pulmonary vasculature is normal. No consolidation, pleural effusion, or pneumothorax. No acute osseous abnormalities are seen. IMPRESSION: 1. Mild bronchial thickening. 2. Calcified mediastinal and left hilar lymph nodes consistent with prior granulomatous disease. Electronically Signed   By: Andrea Gasman M.D.   On: 09/18/2023 22:02        Scheduled Meds:  albuterol   2.5 mg Nebulization Q6H   aspirin  EC  81 mg Oral Daily   azithromycin   500 mg Oral Daily   cycloSPORINE   1 drop Both Eyes BID   escitalopram   10 mg Oral Daily   guaiFENesin   1,200 mg Oral BID   heparin   5,000 Units Subcutaneous Q8H   loratadine   10 mg Oral Daily   pantoprazole   40  mg Oral BID AC   predniSONE   10 mg Oral Q breakfast   rivastigmine   9.5 mg Transdermal Daily   sodium chloride   1 spray Each Nare Daily   umeclidinium-vilanterol  1 puff Inhalation Daily   Continuous Infusions:   LOS: 0 days    Time spent: 40 minutes    Toribio Hummer, MD [facility]   To contact the attending provider between 7A-7P or the covering provider during after hours 7P-7A, please log into the web site [email or web address] and access using universal [place] password for that web site. If you do not have the password, please call the hospital operator.  09/20/2023, 6:44 PM

## 2023-09-20 NOTE — Care Management Obs Status (Signed)
 MEDICARE OBSERVATION STATUS NOTIFICATION   Patient Details  Name: Cheryl [name] MRN: [id number] Date of Birth: August 03, 1940   Medicare Observation Status Notification Given:  [name], LCSW 09/20/2023, 1:41 PM

## 2023-09-20 NOTE — Progress Notes (Signed)
 SATURATION QUALIFICATIONS: (This note is used to comply with regulatory documentation for home oxygen)  Patient Saturations on Room Air at Rest = 95%  Patient Saturations on Room Air while Ambulating = 98%  Patient Saturations on  Liters of oxygen while Ambulating = 2%  Please briefly explain why patient needs home oxygen:  Patient does not qualify for oxygen at home

## 2023-09-20 NOTE — Consult Note (Addendum)
 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 She presented to ED 8/11 from home w SOB and hypoxia. Reportedly she has been cautioned against use of steroids by her ophthalmologist 2/2 r/o steroid induced glaucoma, and pt was managed w SABA LAMA LABA, azithro.  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 Steroids deferred w ophth concerns 8/13 PCCM consulted   Interim History / Subjective:  Consulted   She wants to go home, really missing her Cyril, boo   Objective    Blood pressure (!) 116/48, pulse 82, temperature 98.5 F (36.9 C), temperature source Oral, resp. rate 18, SpO2 91%.        Intake/Output Summary (Last 24 hours) at 09/20/2023 1116 Last data filed at 09/20/2023 0600 Gross per 24 hour  Intake 1200 ml  Output 150 ml  Net 1050 ml   There were no vitals filed for this visit.  Examination: General: wdwn elderly F  HENT: NCAT  Lungs: even unlabored on RA  Cardiovascular: cap refill < 3 sec  Abdomen: round  Extremities: no obvious joint deformity  Neuro: Awake, alert  GU: defer   Resolved problem list   Assessment and Plan    Acute hypoxic respiratory failure Hx reactive airway disease Bronchitis  Hx tobacco use  DNR/I -pt very cautious of  steroids w hx incr ocular pressure 2/2 r/o steroid induced glaucoma P -cont azithro -send RVP  - anoro ellipta , PRN albuterol   -short term, benefit > risk re steroids -- plan will be for short course of low dose prednisone  -- 10mg  x 5d -would get walking spo2 -- if she's above 88 off of O2 can likely go home  -she is scheduled to see Dr. Kara 9/3. Plan d/w pt/daughter re possible addition of neb ICS at home depending on how she is doing post dc and pre follow up  -cont existing mgmnt plans for gerd   Best Practice (right click and Reselect all SmartList Selections daily)   Per primary   Labs   CBC: Recent Labs  Lab 09/18/23 2136 09/20/23 0431  WBC 7.1 6.9  HGB 13.9 13.7  HCT 43.9 42.8  MCV 88.7 89.4  PLT 230 235    Basic Metabolic Panel: Recent Labs  Lab 09/18/23 2136 09/20/23 0431  NA 138 139  K 3.7 3.9  CL 102 106  CO2 25 24  GLUCOSE 128* 106*  BUN 13 11  CREATININE 0.94 0.74  CALCIUM  9.5 9.2   GFR: CrCl cannot be calculated (Unknown ideal weight.). Recent Labs  Lab 09/18/23 2136 09/20/23 0431  WBC 7.1 6.9    Liver Function Tests: No results for input(s): AST, ALT, ALKPHOS, BILITOT, PROT, ALBUMIN in the last 168 hours. No results for input(s): LIPASE, AMYLASE in the last 168  hours. No results for input(s): AMMONIA in the last 168 hours.  ABG No results found for: PHART, PCO2ART, PO2ART, HCO3, TCO2, ACIDBASEDEF, O2SAT   Coagulation Profile: No results for input(s): INR, PROTIME in the last 168 hours.  Cardiac Enzymes: No results for input(s): CKTOTAL, CKMB, CKMBINDEX, TROPONINI in the last 168 hours.  HbA1C: Hgb A1c MFr Bld  Date/Time Value Ref Range Status  11/14/2022 08:35 AM 5.8 (H) 4.8 - 5.6 % Final    Comment:             Prediabetes: 5.7 - 6.4          Diabetes: >6.4          Glycemic control for adults with diabetes: <7.0   02/15/2021 11:59 AM 5.6 <5.7 % of total Hgb Final    Comment:     For the purpose of screening for the presence of diabetes: . <5.7%       Consistent with the absence of diabetes 5.7-6.4%    Consistent with increased risk for diabetes             (prediabetes) > or =6.5%  Consistent with diabetes . This assay result is consistent with a decreased risk of diabetes. . Currently, no consensus exists regarding use of hemoglobin A1c for diagnosis of diabetes in children. . According to American Diabetes Association (ADA) guidelines, hemoglobin A1c <7.0% represents optimal control in non-pregnant diabetic patients. Different metrics may apply to specific patient populations.  Standards of Medical Care in Diabetes(ADA). .     CBG: No results for input(s): GLUCAP in the last 168 hours.  Review of Systems:   Review of Systems  Respiratory:  Positive for cough and shortness of breath.      Past Medical History:  She,  has a past medical history of Anemia, Anxiety, Arthritis, Bone spur, Carotid stenosis, bilateral (06/06/2019), Chronic back pain, Dementia (HCC), Depression, Diverticulosis, Dizzy, Encephalitis, Fatty liver, GERD (gastroesophageal reflux disease), Hepatitis (2012), History of bronchitis, History of colon polyps, History of shingles, Hyperlipidemia, IBS (irritable bowel syndrome), Insomnia, Internal hemorrhoids, Joint pain, Joint swelling, Lower leg edema (09/21/2021), Lung nodules, Migraine headache, Orthostatic hypotension (09/21/2021), Pneumonia, Renal cyst, and Uterine cancer (HCC) (1973).   Surgical History:   Past Surgical History:  Procedure Laterality Date   APPENDECTOMY  1966   cataracts     bilateral   COLONOSCOPY  09/04/2020   EUS  02/10/2011   Procedure: UPPER ENDOSCOPIC ULTRASOUND (EUS) RADIAL;  Surgeon: Toribio Cedar, MD;  Location: [facility];  Service: Endoscopy;  Laterality: N/A;  radial linear    LUMBAR FUSION  03/2016   L4-5, Dr. Epifanio.   RECTOCELE REPAIR  2011   ROTATOR CUFF REPAIR  2009   left    TONSILLECTOMY     UPPER GASTROINTESTINAL ENDOSCOPY     VAGINAL HYSTERECTOMY  1973     Social History:   reports that she quit smoking about 30 years ago. Her smoking use included cigarettes. She has never used smokeless tobacco. She reports that she does not drink alcohol and does not use drugs.   Family History:  Her family history includes Alcohol abuse in her brother and father; Crohn's disease in her brother; Depression in her brother; HIV in her son; Heart disease in her father and another family member; Heart failure in her daughter; Kidney disease in her mother; Kidney failure in her mother; Lung cancer in her father and son; Memory loss in her mother; Rheum arthritis in her mother. There  is no history of Malignant hyperthermia, Colon cancer, Esophageal cancer, Rectal cancer, Stomach cancer, or Migraines.   Allergies Allergies  Allergen Reactions   Celebrex [Celecoxib] Itching, Nausea Only and Other (See Comments)    Gas Hair loss   Prozac [Fluoxetine] Shortness Of Breath and Itching   Ambien  [Zolpidem ] Other (See Comments)    Headaches    Amipaque [Metrizamide] Itching   Aricept  [Donepezil  Hcl] Other (See Comments)    Nightmares    Cipro  [Ciprofloxacin  Hcl] Other (See Comments)    Myopathy    Crestor  [Rosuvastatin ] Other (See Comments)    Myalgias    Decadron  [Dexamethasone ] Other (See Comments)    *High risk for increased intra-ocular pressure -> steroid-induced glaucoma   Elavil  [Amitriptyline ] Other (See Comments)    Dry mouth    Livalo  [Pitavastatin ] Other (See Comments)    Myalgias   Multihance  [Gadobenate] Itching   Nitrostat  [Nitroglycerin ] Nausea Only and Other (See Comments)    Dizziness Diaphoresis    Statins Other (See Comments)    Myalgias     Zocor  [Simvastatin ] Other (See Comments)    Myalgias Headaches    Codeine Sulfate Itching   Sulfonamide Derivatives Nausea Only     Home Medications  Prior to Admission medications   Medication Sig Start  Date End Date Taking? Authorizing Provider  albuterol  (PROVENTIL ) (2.5 MG/3ML) 0.083% nebulizer solution Take 2.5 mg by nebulization every 6 (six) hours as needed for wheezing or shortness of breath.   Yes [provider]  albuterol  (VENTOLIN  HFA) 108 (90 Base) MCG/ACT inhaler Inhale 2 puffs into the lungs every 6 (six) hours as needed for wheezing or shortness of breath. 04/24/23  Yes Booker Darice SAUNDERS, FNP  aspirin  81 MG tablet Take 81 mg by mouth daily.   Yes [provider]  Cholecalciferol (VITAMIN D -3 PO) Take 1 capsule by mouth daily.   Yes [provider]  cycloSPORINE  (RESTASIS ) 0.05 % ophthalmic emulsion Place 1 drop into both eyes 2 (two) times daily. 11/26/21  Yes Alvan Dorothyann BIRCH, MD  escitalopram  (LEXAPRO ) 10 MG tablet Take 1 tablet (10 mg total) by mouth daily. 02/21/23  Yes Bevin, Erika S, DO  Evolocumab  (REPATHA  SURECLICK) 140 MG/ML SOAJ Inject 140 mg into the skin every 14 (fourteen) days. Patient taking differently: Inject 140 mg into the skin See admin instructions. Inject 1mL (140mg ) into the skin every other Monday. 07/21/23  Yes Raford Riggs, MD  ipratropium (ATROVENT ) 0.03 % nasal spray Place 2 sprays into both nostrils every 12 (twelve) hours. Patient taking differently: Place 2 sprays into both nostrils 2 (two) times daily as needed for rhinitis. 08/04/23  Yes Kara Dorn NOVAK, MD  MAGNESIUM  PO Take 1 tablet by mouth daily.   Yes [provider]  Multiple Vitamins-Minerals (MULTIVITAMIN WOMEN 50+) TABS Take 1 tablet by mouth daily.   Yes [provider]  nystatin  (MYCOSTATIN /NYSTOP ) powder Apply 1 Application topically 2 (two) times daily as needed. 02/21/23  Yes Bevin Bernice RAMAN, DO  Omega-3 Fatty Acids (FISH OIL  PO) Take 2 capsules by mouth daily.   Yes [provider]  omeprazole  (PRILOSEC) 20 MG capsule Take 20 mg by mouth daily.   Yes [provider]  PREMARIN  vaginal cream Place 1 applicator vaginally See  admin instructions. Apply a small amount vaginally 3-4 nights per week.   Yes [provider]  Probiotic Product (PROBIOTIC PO) Take 1 capsule by mouth daily.   Yes [provider]  rivastigmine  (EXELON ) 9.5 mg/24hr Place 9.5  mg onto the skin daily.   Yes [provider]  traMADol  (ULTRAM ) 50 MG tablet Take 1 tablet (50 mg total) by mouth 2 (two) times daily as needed. for pain 02/21/23  Yes Wachs, Erika S, DO  TURMERIC PO Take 2 each by mouth daily.   Yes [provider]  umeclidinium-vilanterol (ANORO ELLIPTA ) 62.5-25 MCG/ACT AEPB Inhale 1 puff into the lungs daily. 08/04/23  Yes Kara Dorn NOVAK, MD     Critical care time: na       Ronnald Gave MSN, AGACNP-BC [facility] Amion for pager  09/20/2023, 11:16 AM

## 2023-09-21 DIAGNOSIS — Z87891 Personal history of nicotine dependence: Secondary | ICD-10-CM

## 2023-09-21 DIAGNOSIS — K5909 Other constipation: Secondary | ICD-10-CM | POA: Diagnosis not present

## 2023-09-21 DIAGNOSIS — K219 Gastro-esophageal reflux disease without esophagitis: Secondary | ICD-10-CM | POA: Diagnosis not present

## 2023-09-21 DIAGNOSIS — J40 Bronchitis, not specified as acute or chronic: Secondary | ICD-10-CM | POA: Diagnosis not present

## 2023-09-21 DIAGNOSIS — J9601 Acute respiratory failure with hypoxia: Secondary | ICD-10-CM | POA: Diagnosis not present

## 2023-09-21 DIAGNOSIS — J441 Chronic obstructive pulmonary disease with (acute) exacerbation: Secondary | ICD-10-CM | POA: Diagnosis not present

## 2023-09-21 LAB — BASIC METABOLIC PANEL WITH GFR
Anion gap: 10 (ref 5–15)
BUN: 9 mg/dL (ref 8–23)
CO2: 24 mmol/L (ref 22–32)
Calcium: 9.6 mg/dL (ref 8.9–10.3)
Chloride: 106 mmol/L (ref 98–111)
Creatinine, Ser: 0.78 mg/dL (ref 0.44–1.00)
GFR, Estimated: >60 mL/min (ref >60)
Glucose, Bld: 99 mg/dL (ref 70–99)
Potassium: 3.6 mmol/L (ref 3.5–5.1)
Sodium: 140 mmol/L (ref 135–145)

## 2023-09-21 LAB — CBC
HCT: 41.7 % (ref 36.0–46.0)
Hemoglobin: 13.6 g/dL (ref 12.0–15.0)
MCH: 28.8 pg (ref 26.0–34.0)
MCHC: 32.6 g/dL (ref 30.0–36.0)
MCV: 88.3 fL (ref 80.0–100.0)
Platelets: 240 K/uL (ref 150–400)
RBC: 4.72 MIL/uL (ref 3.87–5.11)
RDW: 13.5 % (ref 11.5–15.5)
WBC: 7.7 K/uL (ref 4.0–10.5)
nRBC: 0 % (ref 0.0–0.2)

## 2023-09-21 MED ORDER — ALBUTEROL SULFATE (2.5 MG/3ML) 0.083% IN NEBU: INHALATION_SOLUTION | RESPIRATORY_TRACT | Status: DC

## 2023-09-21 MED ORDER — GUAIFENESIN ER 600 MG PO TB12: 1200.0000 mg | ORAL_TABLET | Freq: Two times a day (BID) | ORAL | 0 refills | Status: AC

## 2023-09-21 MED ORDER — LORATADINE 10 MG PO TABS
10.0000 mg | ORAL_TABLET | Freq: Every day | ORAL | 1 refills | Status: AC
Start: 2023-09-22 — End: ?

## 2023-09-21 MED ORDER — OMEPRAZOLE 20 MG PO CPDR: 20.0000 mg | DELAYED_RELEASE_CAPSULE | Freq: Every day | ORAL | 1 refills | Status: DC

## 2023-09-21 MED ORDER — PREDNISONE 10 MG PO TABS: 10.0000 mg | ORAL_TABLET | Freq: Every day | ORAL | 0 refills | Status: AC

## 2023-09-21 NOTE — Consult Note (Signed)
 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 She presented to ED 8/11 from home w SOB and hypoxia. Reportedly she has been cautioned against use of steroids by her ophthalmologist 2/2 r/o steroid induced glaucoma, and pt was managed w SABA LAMA LABA, azithro.  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 Steroids deferred w ophth concerns 8/13 PCCM consulted, 10mg  prednisone  daily started  Interim History / Subjective:   Patient feeling better today No desaturations with ambulatory O2 monitoring yesterday. Started prednisone  10mg  daily yesterday, tolerating it OK. No vision changes.  Objective    Blood pressure (!) 115/52, pulse 75, temperature 98.5 F (36.9 C), temperature source Oral, resp. rate 16, SpO2 91%.        Intake/Output Summary (Last 24 hours) at 09/21/2023 1012 Last data filed at 09/21/2023 0600 Gross per 24 hour  Intake 1140 ml  Output --  Net 1140 ml   There were no vitals filed for this visit.  Examination: General: elderly woman, sitting up in chair HENT: NCAT  Lungs: even unlabored on RA  Cardiovascular: rrr, no murmurs Abdomen: soft, non-tender Extremities: warm, no edema Neuro: Awake, alert  GU: defer   Resolved problem list   Assessment  and Plan    Acute hypoxic respiratory failure Hx reactive airway disease Bronchitis  Hx tobacco use  DNR/I P -cont azithro - continue 10mg  prednisone  daily for 5 days total - anoro ellipta , PRN albuterol   - no ambulatory desaturations noted -she is scheduled for follow up 9/3. Plan d/w pt/daughter re possible addition of neb ICS at home depending on how she is doing post dc and pre follow up  -cont existing mgmnt plans for gerd  - she is safe for discharge home from [facility]  PCCM will sign off.   Best Practice (right click and Reselect all SmartList Selections daily)   Per primary   Labs   CBC: Recent Labs  Lab 09/18/23 2136 09/20/23 0431 09/21/23 0428  WBC 7.1 6.9 7.7  HGB 13.9 13.7 13.6  HCT 43.9 42.8 41.7  MCV 88.7 89.4 88.3  PLT 230 235 240    Basic Metabolic Panel: Recent Labs  Lab 09/18/23 2136 09/20/23 0431 09/21/23 0428  NA 138 139 140  K 3.7 3.9 3.6  CL 102 106 106  CO2 25 24 24   GLUCOSE 128* 106* 99  BUN 13 11 9   CREATININE 0.94 0.74 0.78  CALCIUM  9.5 9.2 9.6   GFR: CrCl cannot be calculated (Unknown ideal weight.). Recent Labs  Lab 09/18/23 2136 09/20/23 0431 09/21/23 0428  WBC 7.1 6.9 7.7    Liver Function Tests: No results for input(s): AST, ALT, ALKPHOS, BILITOT, PROT, ALBUMIN in the last 168 hours. No results for input(s): LIPASE, AMYLASE in the last 168  hours. No results for input(s): AMMONIA in the last 168 hours.  ABG No results found for: PHART, PCO2ART, PO2ART, HCO3, TCO2, ACIDBASEDEF, O2SAT   Coagulation Profile: No results for input(s): INR, PROTIME in the last 168 hours.  Cardiac Enzymes: No results for input(s): CKTOTAL, CKMB, CKMBINDEX, TROPONINI in the last 168 hours.  HbA1C: Hgb A1c MFr Bld  Date/Time Value Ref Range Status  11/14/2022 08:35 AM 5.8 (H) 4.8 - 5.6 % Final    Comment:             Prediabetes: 5.7 - 6.4          Diabetes: >6.4           Glycemic control for adults with diabetes: <7.0   02/15/2021 11:59 AM 5.6 <5.7 % of total Hgb Final    Comment:    For the purpose of screening for the presence of diabetes: . <5.7%       Consistent with the absence of diabetes 5.7-6.4%    Consistent with increased risk for diabetes             (prediabetes) > or =6.5%  Consistent with diabetes . This assay result is consistent with a decreased risk of diabetes. . Currently, no consensus exists regarding use of hemoglobin A1c for diagnosis of diabetes in children. . According to American Diabetes Association (ADA) guidelines, hemoglobin A1c <7.0% represents optimal control in non-pregnant diabetic patients. Different metrics may apply to specific patient populations.  Standards of Medical Care in Diabetes(ADA). .     CBG: No results for input(s): GLUCAP in the last 168 hours.    Critical care time: na    Dorn Chill, MD [facility] Office: [phone]   See Amion for personal pager PCCM on call pager [phone] until 7pm. Please call Elink 7p-7a. [phone]

## 2023-09-21 NOTE — Discharge Summary (Signed)
 Physician Discharge Summary  Cheryl [name] FMW:[id number] DOB: 02-26-1940 DOA: 09/18/2023  PCP: [name], [name], DO  Admit date: 09/18/2023 Discharge date: 09/21/2023  Time spent: 60 minutes  Recommendations for Outpatient Follow-up:  Follow-up with [name], [name], DO in 2 weeks.  On follow-up patient will need a basic metabolic profile done to follow-up on electrolytes and renal function. Follow-up with Dr. Kara pulmonary as previously scheduled.   Discharge Diagnoses:  Principal Problem:   COPD exacerbation (HCC) Active Problems:   Acute hypoxic respiratory failure (HCC)   Anxiety and depression   CONSTIPATION, CHRONIC   Migraine headache   Dementia (HCC)   Gastroesophageal reflux disease without esophagitis   Chronic bronchitis (HCC)   Discharge Condition: Stable and improved.  Diet recommendation: Regular  There were no vitals filed for this visit.  History of present illness:  HPI per Dr. [name] LELON Cheryl [name] is a 83 y.o. female who presents with worsening respiratory status and hypoxia from baseline over the past week to 2 weeks.  She denies any recent travel sick contacts or recent illnesses herself.  Family bedside indicates she has been doing somewhat poorly over the past 7-month timeframe after COVID infection with no real resolution since that time.  Patient follows closely with pulmonology who images to evaluate her last week with no medication changes.  Her regimen is complicated by recommendations from ophthalmology to avoid steroids due to elevated ocular pressure and risk of steroid-induced glaucoma. It is unclear if this is in regards to chronic steroids only or if also includes acute bolus systemic steroids or topical/inhaled steroids.   Patient has a known history of advanced dementia, anxiety/depression, diverticulosis, IBS, chronic constipation, unspecified granulomatous disease history, multiple lung nodules, renal cysts, GERD, obesity, migraine  without aura, iron deficiency anemia and chronic bronchitis   ED Course: Presented with hypoxia into the low 80s, imaging at intake shows bilateral bronchial thickening, patient received DuoNebs, magnesium  with moderate improvement in symptoms, hospitalist for admission.  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 --Patient has been educated to avoid steroids by her ophthalmologist due to risk of steroid-induced glaucoma. Will attempt to confirm that this is in regards to chronic steroids versus topical/inhaled steroids as patient would benefit from inhaled steroids at this time  -It is noted per admitting physician the daughter indicates that patient respiratory status has declined over the past 8 months since being diagnosed with COVID and concern for evolving fibrosis given history of [place] disease.  .   - Patient was placed on scheduled albuterol  nebs, azithromycin , Anoro Ellipta , Claritin , PPI, saline nasal spray, Mucinex . - Due to some loose stools patient refused last dose of azithromycin  during the hospitalization.. -Pulmonary consulted to assess the patient and discussed with patient and patient in agreement with starting 10 mg of prednisone  daily x 5 days. -Patient tolerated prednisone  10 mg daily during the hospitalization without any visual changes. -Ambulatory sats were checked and patient noted to have sats > 95% on room air on ambulation. -Patient improved clinically and will be discharged home in stable and improved condition with 3 more days of prednisone  to complete a 5-day course of treatment as recommended by pulmonary, continue home regimen of Anoro Ellipta , will be discharged  on albuterol  nebs 3 times daily x 3 to 4 days and then  as needed. -Outpatient follow-up with pulmonary as previously scheduled.   2.  Advanced dementia -It is noted at daughter gave most of the history and patient deferring to daughter for further medical management as default POA. - Daughter noted to have confirmed DNR. - Patient maintained on home regimen rivastigmine . - Outpatient follow-up.   3.  IBS, diverticulosis, chronic constipation -Stable.   4.  Depression/anxiety - patient maintained on home regimen of rivastigmine , escitalopram .   5.  Migraine without aura -Stable. - Noted to be on Excedrin Migraine at home.   6.  GERD - Patient maintained on a PPI.      Procedures: Chest x-ray 09/18/2023  Consultations: PCCM/pulmonary: Dr. Kara 09/20/2023  Discharge Exam: Vitals:   09/21/23 0548 09/21/23 0803  BP: (!) 115/52   Pulse: 75   Resp: 16   Temp: 98.5 F (36.9 C)   SpO2: 93% 91%    General: NAD Cardiovascular: Regular rate rhythm no murmurs rubs or gallops.  No JVD.  No lower extremity edema. Respiratory: Clear to auscultation bilaterally.  No wheezes, no crackles, no rhonchi.  Fair air movement.  Speaking in full sentences.  Discharge Instructions   Discharge Instructions     Diet general   Complete by: As directed    Increase activity slowly   Complete by: As directed       Allergies as of 09/21/2023       Reactions   Celebrex [celecoxib] Itching, Nausea Only, Other (See Comments)   Gas Hair loss   Prozac [fluoxetine] Shortness Of Breath, Itching   Ambien  [zolpidem ] Other (See Comments)   Headaches    Amipaque [metrizamide] Itching   Aricept  [donepezil  Hcl] Other (See Comments)   Nightmares   Cipro  [ciprofloxacin  Hcl] Other (See Comments)   Myopathy    Crestor  [rosuvastatin ] Other (See Comments)   Myalgias    Decadron  [dexamethasone ] Other (See Comments)   *High risk for increased intra-ocular pressure -> steroid-induced glaucoma    Elavil  [amitriptyline ] Other (See Comments)   Dry mouth   Livalo  [pitavastatin ] Other (See Comments)   Myalgias   Multihance  [gadobenate] Itching   Nitrostat  [nitroglycerin ] Nausea Only, Other (See Comments)   Dizziness Diaphoresis    Statins Other (See Comments)   Myalgias    Zocor  [simvastatin ] Other (See Comments)   Myalgias Headaches    Codeine Sulfate Itching   Sulfonamide Derivatives Nausea Only        Medication List     TAKE these medications    albuterol  108 (90 Base) MCG/ACT inhaler Commonly known as: VENTOLIN  HFA Inhale 2 puffs into the lungs every 6 (six) hours as needed for wheezing or shortness of breath. What changed: Another medication with the same name was changed. Make sure you understand how and when to take each.   albuterol  (2.5 MG/3ML) 0.083% nebulizer solution Commonly known as: PROVENTIL  Take 3 mLs (2.5 mg total) by nebulization 3 (three) times daily for 3 days, THEN 3 mLs (2.5 mg total) every 6 (six) hours as needed for wheezing or shortness of breath. Start taking on: September 21, 2023 What changed: See the new instructions.   aspirin  81 MG tablet Take 81 mg by mouth daily.   cycloSPORINE  0.05 % ophthalmic emulsion Commonly known as: Restasis  Place 1 drop into both eyes 2 (two) times daily.   escitalopram  10 MG tablet Commonly known as: LEXAPRO  Take 1 tablet (10 mg total) by mouth daily.   FISH OIL  PO Take 2 capsules by mouth daily.  guaiFENesin  600 MG 12 hr tablet Commonly known as: MUCINEX  Take 2 tablets (1,200 mg total) by mouth 2 (two) times daily for 5 days.   ipratropium 0.03 % nasal spray Commonly known as: ATROVENT  Place 2 sprays into both nostrils every 12 (twelve) hours. What changed:  when to take this reasons to take this   loratadine  10 MG tablet Commonly known as: CLARITIN  Take 1 tablet (10 mg total) by mouth daily. Start taking on: September 22, 2023   MAGNESIUM  PO Take 1 tablet by mouth daily.   Multivitamin  Women 50+ Tabs Take 1 tablet by mouth daily.   nystatin  powder Commonly known as: MYCOSTATIN /NYSTOP  Apply 1 Application topically 2 (two) times daily as needed.   omeprazole  20 MG capsule Commonly known as: PRILOSEC Take 1 capsule (20 mg total) by mouth daily.   predniSONE  10 MG tablet Commonly known as: DELTASONE  Take 1 tablet (10 mg total) by mouth daily with breakfast for 3 days. Start taking on: September 22, 2023   Premarin  vaginal cream Generic drug: conjugated estrogens  Place 1 applicator vaginally See admin instructions. Apply a small amount vaginally 3-4 nights per week.   PROBIOTIC PO Take 1 capsule by mouth daily.   Repatha  SureClick 140 MG/ML Soaj Generic drug: Evolocumab  Inject 140 mg into the skin every 14 (fourteen) days. What changed:  when to take this additional instructions   rivastigmine  9.5 mg/24hr Commonly known as: EXELON  Place 9.5 mg onto the skin daily.   traMADol  50 MG tablet Commonly known as: ULTRAM  Take 1 tablet (50 mg total) by mouth 2 (two) times daily as needed. for pain   TURMERIC PO Take 2 each by mouth daily.   umeclidinium-vilanterol 62.5-25 MCG/ACT Aepb Commonly known as: Anoro Ellipta  Inhale 1 puff into the lungs daily.   VITAMIN D -3 PO Take 1 capsule by mouth daily.       Allergies  Allergen Reactions   Celebrex [Celecoxib] Itching, Nausea Only and Other (See Comments)    Gas Hair loss   Prozac [Fluoxetine] Shortness Of Breath and Itching   Ambien  [Zolpidem ] Other (See Comments)    Headaches    Amipaque [Metrizamide] Itching   Aricept  [Donepezil  Hcl] Other (See Comments)    Nightmares    Cipro  [Ciprofloxacin  Hcl] Other (See Comments)    Myopathy    Crestor  [Rosuvastatin ] Other (See Comments)    Myalgias    Decadron  [Dexamethasone ] Other (See Comments)    *High risk for increased intra-ocular pressure -> steroid-induced glaucoma   Elavil  [Amitriptyline ] Other (See Comments)    Dry mouth    Livalo  [Pitavastatin ]  Other (See Comments)    Myalgias   Multihance  [Gadobenate] Itching   Nitrostat  [Nitroglycerin ] Nausea Only and Other (See Comments)    Dizziness Diaphoresis    Statins Other (See Comments)    Myalgias     Zocor  [Simvastatin ] Other (See Comments)    Myalgias Headaches    Codeine Sulfate Itching   Sulfonamide Derivatives Nausea Only    Follow-up Information     Chet Mad, DO. Schedule an appointment as soon as possible for a visit in 2 week(s).   Specialty: Family Medicine Contact information: [address] [place] [address] [phone]         Cheryl [name] Dorn NOVAK, MD Follow up.   Specialty: Pulmonary Disease Why: f/u as scheduled. Contact information: [address] [place] [address] [phone]  The results of significant diagnostics from this hospitalization (including imaging, microbiology, ancillary and laboratory) are listed below for reference.    Significant Diagnostic Studies: DG Chest 2 View Result Date: 09/18/2023 CLINICAL DATA:  Chest pain.  Low oxygen saturation. EXAM: CHEST - 2 VIEW COMPARISON:  CT 06/14/2023 FINDINGS: The heart is normal in size. Calcified mediastinal and left hilar lymph nodes, as seen on prior chest CT. Mild bronchial thickening. Pulmonary vasculature is normal. No consolidation, pleural effusion, or pneumothorax. No acute osseous abnormalities are seen. IMPRESSION: 1. Mild bronchial thickening. 2. Calcified mediastinal and left hilar lymph nodes consistent with prior granulomatous disease. Electronically Signed   By: Andrea Gasman M.D.   On: 09/18/2023 22:02    Microbiology: No results found for this or any previous visit (from the past 240 hours).   Labs: Basic Metabolic Panel: Recent Labs  Lab 09/18/23 2136 09/20/23 0431 09/21/23 0428  NA 138 139 140  K 3.7 3.9 3.6  CL 102 106 106  CO2 25 24 24   GLUCOSE 128* 106* 99  BUN 13 11 9    CREATININE 0.94 0.74 0.78  CALCIUM  9.5 9.2 9.6   Liver Function Tests: No results for input(s): AST, ALT, ALKPHOS, BILITOT, PROT, ALBUMIN in the last 168 hours. No results for input(s): LIPASE, AMYLASE in the last 168 hours. No results for input(s): AMMONIA in the last 168 hours. CBC: Recent Labs  Lab 09/18/23 2136 09/20/23 0431 09/21/23 0428  WBC 7.1 6.9 7.7  HGB 13.9 13.7 13.6  HCT 43.9 42.8 41.7  MCV 88.7 89.4 88.3  PLT 230 235 240   Cardiac Enzymes: No results for input(s): CKTOTAL, CKMB, CKMBINDEX, TROPONINI in the last 168 hours. BNP: BNP (last 3 results) No results for input(s): BNP in the last 8760 hours.  ProBNP (last 3 results) No results for input(s): PROBNP in the last 8760 hours.  CBG: No results for input(s): GLUCAP in the last 168 hours.     Signed:  Toribio Hummer MD.  [facility] 09/21/2023, 11:26 AM

## 2023-10-02 ENCOUNTER — Other Ambulatory Visit (HOSPITAL_COMMUNITY): Payer: Self-pay | Admitting: Family Medicine

## 2023-10-02 ENCOUNTER — Ambulatory Visit (HOSPITAL_COMMUNITY)
Admission: RE | Admit: 2023-10-02 | Discharge: 2023-10-02 | Disposition: A | Discharge status: Home / Self Care | Source: Ambulatory Visit | Attending: Surgery | Admitting: Surgery

## 2023-10-02 DIAGNOSIS — M79622 Pain in left upper arm: Secondary | ICD-10-CM | POA: Insufficient documentation

## 2023-10-02 DIAGNOSIS — M79602 Pain in left arm: Secondary | ICD-10-CM | POA: Diagnosis not present

## 2023-10-02 DIAGNOSIS — Z09 Encounter for follow-up examination after completed treatment for conditions other than malignant neoplasm: Secondary | ICD-10-CM | POA: Diagnosis not present

## 2023-10-04 DIAGNOSIS — H40022 Open angle with borderline findings, high risk, left eye: Secondary | ICD-10-CM | POA: Diagnosis not present

## 2023-10-10 ENCOUNTER — Encounter: Payer: Self-pay | Admitting: Sports Medicine

## 2023-10-11 ENCOUNTER — Encounter (INDEPENDENT_AMBULATORY_CARE_PROVIDER_SITE_OTHER): Payer: Self-pay

## 2023-10-11 ENCOUNTER — Ambulatory Visit: Admitting: Pulmonary Disease

## 2023-10-11 ENCOUNTER — Encounter: Payer: Self-pay | Admitting: Pulmonary Disease

## 2023-10-11 ENCOUNTER — Ambulatory Visit (INDEPENDENT_AMBULATORY_CARE_PROVIDER_SITE_OTHER): Admitting: Pulmonary Disease

## 2023-10-11 VITALS — BP 100/55 | HR 66 | Ht 63.0 in | Wt 165.2 lb

## 2023-10-11 DIAGNOSIS — J4540 Moderate persistent asthma, uncomplicated: Secondary | ICD-10-CM

## 2023-10-11 DIAGNOSIS — R053 Chronic cough: Secondary | ICD-10-CM

## 2023-10-11 DIAGNOSIS — K219 Gastro-esophageal reflux disease without esophagitis: Secondary | ICD-10-CM | POA: Diagnosis not present

## 2023-10-11 DIAGNOSIS — J4520 Mild intermittent asthma, uncomplicated: Secondary | ICD-10-CM

## 2023-10-11 DIAGNOSIS — J4530 Mild persistent asthma, uncomplicated: Secondary | ICD-10-CM | POA: Diagnosis not present

## 2023-10-11 LAB — PULMONARY FUNCTION TEST
DL/VA % pred: 117 %
DL/VA: 4.83 ml/min/mmHg/L
DLCO cor % pred: 119 %
DLCO cor: 21.69 ml/min/mmHg
DLCO unc % pred: 120 %
DLCO unc: 21.83 ml/min/mmHg
FEF 25-75 Post: 1.64 L/s
FEF 25-75 Pre: 1.11 L/s
FEF2575-%Change-Post: 47 %
FEF2575-%Pred-Post: 126 %
FEF2575-%Pred-Pre: 85 %
FEV1-%Change-Post: 14 %
FEV1-%Pred-Post: 107 %
FEV1-%Pred-Pre: 93 %
FEV1-Post: 1.95 L
FEV1-Pre: 1.7 L
FEV1FVC-%Change-Post: 12 %
FEV1FVC-%Pred-Pre: 91 %
FEV6-%Change-Post: 1 %
FEV6-%Pred-Post: 110 %
FEV6-%Pred-Pre: 108 %
FEV6-Post: 2.54 L
FEV6-Pre: 2.5 L
FEV6FVC-%Change-Post: 0 %
FEV6FVC-%Pred-Post: 105 %
FEV6FVC-%Pred-Pre: 105 %
FVC-%Change-Post: 1 %
FVC-%Pred-Post: 104 %
FVC-%Pred-Pre: 103 %
FVC-Post: 2.56 L
FVC-Pre: 2.53 L
Post FEV1/FVC ratio: 76 %
Post FEV6/FVC ratio: 99 %
Pre FEV1/FVC ratio: 67 %
Pre FEV6/FVC Ratio: 99 %
RV % pred: 159 %
RV: 3.77 L
TLC % pred: 122 %
TLC: 6.03 L

## 2023-10-11 MED ORDER — BUDESONIDE 0.25 MG/2ML IN SUSP: 0.2500 mg | RESPIRATORY_TRACT | 11 refills | Status: DC

## 2023-10-11 MED ORDER — FAMOTIDINE 20 MG PO TABS: 20.0000 mg | ORAL_TABLET | Freq: Every day | ORAL | Status: DC

## 2023-10-11 MED ORDER — PANTOPRAZOLE SODIUM 40 MG PO TBEC: 40.0000 mg | DELAYED_RELEASE_TABLET | Freq: Every day | ORAL | 6 refills | Status: DC

## 2023-10-11 NOTE — Progress Notes (Unsigned)
 Synopsis: Referred in June 2025 for Shortness of breath  Subjective:   PATIENT ID: Cheryl [name] GENDER: female DOB: 09-04-1940, MRN: [id number]  HPI  Chief Complaint  Patient presents with   Medical Management of Chronic Issues    F/u PFT    Cheryl [name] is an 83 year old woman, former smoker with history of GERD, dementia and carotid stenosis who is referred to pulmonary clinic for shortness of breath.   Initial OV 08/04/23 She was seen by cardiology 07/21/23, note reviewed. She had normal spirometry in 2019.   She has experienced changes in breathing since December, with significant symptoms by January or February. Wheezing was initially noted by others. She denies current shortness of breath, experiencing it only occasionally with activity. A persistent cough with mucus production has not improved since onset. A cough wakes her up about once a week. She occasionally coughs up whitish mucus.  She was diagnosed with bronchitis at the end of last year and treated with albuterol . She has had multiple sinus infections and COVID-19, treated with antibiotics and steroids, which provided short-term relief. Currently, she is not experiencing significant sinus issues.  She takes omeprazole  for reflux at a half dose and uses albuterol  as needed. She avoids nasal sprays due to concerns about eye pressure related to borderline glaucoma.  Her past medical history includes heavy smoking, which she quit in 1993. She was exposed to secondhand smoke during childhood.   OV 10/11/23 She has been doing well since the hospital. She was admitted for asthma exacerbation. Treated with nebs and low dose prednisone  course. Denies issues with her vision while on prednisone . She denies wheezing. Has intermittent shortness of breath.   Discussed adding budesonide  nebulizer treatments every other day to her regimen and continuing anoro. Her daughter and her are in agreement in starting this therapy and monitoring her  eye pressures closely.   She continues to have on going cough and throat clearing. Discussed that cough can become habitual and can lead to on going cough from throat irritation. Discussed ENT referral and reflux management.  PFTs post-bronchodilation shows normal spirometry with significant bronchodilator response and reversal of obstruction. There is evidence of air trapping and normal DLCO.  Past Medical History:  Diagnosis Date   Anemia    Anxiety    d/t recent family issues   Arthritis    Bone spur    Carotid stenosis, bilateral 06/06/2019   Chronic back pain    stenosis and OA   Dementia (HCC)    Depression    takes Paxil  daily   Diverticulosis    Dizzy    occasionally   Encephalitis    at 9 months ago    Fatty liver    has had 2 Hep shots and the final one is in June 18   GERD (gastroesophageal reflux disease)    takes Omeprazole  daily   Hepatitis 2012   History of bronchitis    20-25 yrs ago   History of colon polyps    precancerous   History of shingles    Hyperlipidemia    takes Simvastatin  daily   IBS (irritable bowel syndrome)    more on side of constipation-takes Miralax  daily   Insomnia    takes Melatonin and Ambien  nightly   Internal hemorrhoids    Joint pain    Joint swelling    Lower leg edema 09/21/2021   Lung nodules    calcified    Migraine headache  migraines-thinks coming from neck.Last one1/28/18   Orthostatic hypotension 09/21/2021   Pneumonia    hx of-20 yrs ago-=-walking   Renal cyst    left   Uterine cancer (HCC) 1973     Family History  Problem Relation Age of Onset   Kidney disease Mother    Kidney failure Mother        med induced   Rheum arthritis Mother    Memory loss Mother    Heart disease Father    Alcohol abuse Father    Lung cancer Father        smoker   Depression Brother    Crohn's disease Brother    Alcohol abuse Brother    Heart failure Daughter    Lung cancer Son    HIV Son    Heart disease Other         Paternal family    Malignant hyperthermia Neg Hx    Colon cancer Neg Hx    Esophageal cancer Neg Hx    Rectal cancer Neg Hx    Stomach cancer Neg Hx    Migraines Neg Hx      Social History   Socioeconomic History   Marital status: Divorced    Spouse name: Not on file   Number of children: 5   Years of education: 9   Highest education level: 9th grade  Occupational History   Occupation: Retired      Associate Professor: RETIRED  Tobacco Use   Smoking status: Former    Current packs/day: 0.00    Types: Cigarettes    Quit date: 02/07/1993    Years since quitting: 30.6   Smokeless tobacco: Never  Vaping Use   Vaping status: Never Used  Substance and Sexual Activity   Alcohol use: No   Drug use: No   Sexual activity: Not on file  Other Topics Concern   Not on file  Social History Narrative   Lives with her dog. She enjoys watching television. Her daughter lives close by.      Retired                Teacher, early years/pre Strain: Low Risk  (11/02/2022)   Overall Financial Resource Strain (CARDIA)    Difficulty of Paying Living Expenses: Not hard at all  Food Insecurity: No Food Insecurity (09/19/2023)   Hunger Vital Sign    Worried About Running Out of Food in the Last Year: Never true    Ran Out of Food in the Last Year: Never true  Transportation Needs: No Transportation Needs (09/19/2023)   PRAPARE - Administrator, Civil Service (Medical): No    Lack of Transportation (Non-Medical): No  Physical Activity: Inactive (11/02/2022)   Exercise Vital Sign    Days of Exercise per Week: 0 days    Minutes of Exercise per Session: 0 min  Stress: No Stress Concern Present (11/02/2022)   [employer] of Occupational Health - Occupational Stress Questionnaire    Feeling of Stress : Not at all  Social Connections: Socially Isolated (09/19/2023)   Social Connection and Isolation Panel    Frequency of Communication with Friends and Family: More  than three times a week    Frequency of Social Gatherings with Friends and Family: Three times a week    Attends Religious Services: Never    Active Member of Clubs or Organizations: No    Attends Banker Meetings: Never    Marital  Status: Divorced  Catering manager Violence: Not At Risk (09/19/2023)   Humiliation, Afraid, Rape, and Kick questionnaire    Fear of Current or Ex-Partner: No    Emotionally Abused: No    Physically Abused: No    Sexually Abused: No     Allergies  Allergen Reactions   Celebrex [Celecoxib] Itching, Nausea Only and Other (See Comments)    Gas Hair loss   Prozac [Fluoxetine] Shortness Of Breath and Itching   Ambien  [Zolpidem ] Other (See Comments)    Headaches    Amipaque [Metrizamide] Itching   Aricept  [Donepezil  Hcl] Other (See Comments)    Nightmares    Cipro  [Ciprofloxacin  Hcl] Other (See Comments)    Myopathy    Crestor  [Rosuvastatin ] Other (See Comments)    Myalgias    Decadron  [Dexamethasone ] Other (See Comments)    *High risk for increased intra-ocular pressure -> steroid-induced glaucoma   Elavil  [Amitriptyline ] Other (See Comments)    Dry mouth    Livalo  [Pitavastatin ] Other (See Comments)    Myalgias   Multihance  [Gadobenate] Itching   Nitrostat  [Nitroglycerin ] Nausea Only and Other (See Comments)    Dizziness Diaphoresis    Statins Other (See Comments)    Myalgias     Zocor  [Simvastatin ] Other (See Comments)    Myalgias Headaches    Codeine Sulfate Itching   Sulfonamide Derivatives Nausea Only     Outpatient Medications Prior to Visit  Medication Sig Dispense Refill   albuterol  (PROVENTIL ) (2.5 MG/3ML) 0.083% nebulizer solution Take 3 mLs (2.5 mg total) by nebulization 3 (three) times daily for 3 days, THEN 3 mLs (2.5 mg total) every 6 (six) hours as needed for wheezing or shortness of breath.     albuterol  (VENTOLIN  HFA) 108 (90 Base) MCG/ACT inhaler Inhale 2 puffs into the lungs every 6 (six) hours as needed for  wheezing or shortness of breath. 8 g 0   aspirin  81 MG tablet Take 81 mg by mouth daily.     Cholecalciferol (VITAMIN D -3 PO) Take 1 capsule by mouth daily.     cycloSPORINE  (RESTASIS ) 0.05 % ophthalmic emulsion Place 1 drop into both eyes 2 (two) times daily. 5.5 mL 5   escitalopram  (LEXAPRO ) 10 MG tablet Take 1 tablet (10 mg total) by mouth daily. 90 tablet 1   Evolocumab  (REPATHA  SURECLICK) 140 MG/ML SOAJ Inject 140 mg into the skin every 14 (fourteen) days. (Patient taking differently: Inject 140 mg into the skin See admin instructions. Inject 1mL (140mg ) into the skin every other Monday.) 6 mL 3   ipratropium (ATROVENT ) 0.03 % nasal spray Place 2 sprays into both nostrils every 12 (twelve) hours. (Patient taking differently: Place 2 sprays into both nostrils 2 (two) times daily as needed for rhinitis.) 30 mL 12   loratadine  (CLARITIN ) 10 MG tablet Take 1 tablet (10 mg total) by mouth daily. (Patient not taking: Reported on 10/11/2023) 30 tablet 1   MAGNESIUM  PO Take 1 tablet by mouth daily.     Multiple Vitamins-Minerals (MULTIVITAMIN WOMEN 50+) TABS Take 1 tablet by mouth daily.     nystatin  (MYCOSTATIN /NYSTOP ) powder Apply 1 Application topically 2 (two) times daily as needed. 60 g 2   Omega-3 Fatty Acids (FISH OIL  PO) Take 2 capsules by mouth daily.     PREMARIN  vaginal cream Place 1 applicator vaginally See admin instructions. Apply a small amount vaginally 3-4 nights per week.     Probiotic Product (PROBIOTIC PO) Take 1 capsule by mouth daily.     rivastigmine  (EXELON )  9.5 mg/24hr Place 9.5 mg onto the skin daily.     traMADol  (ULTRAM ) 50 MG tablet Take 1 tablet (50 mg total) by mouth 2 (two) times daily as needed. for pain 60 tablet 1   TURMERIC PO Take 2 each by mouth daily.     umeclidinium-vilanterol (ANORO ELLIPTA ) 62.5-25 MCG/ACT AEPB Inhale 1 puff into the lungs daily. 60 each 5   omeprazole  (PRILOSEC) 20 MG capsule Take 1 capsule (20 mg total) by mouth daily. 30 capsule 1   No  facility-administered medications prior to visit.    Review of Systems  Constitutional:  Negative for chills, fever, malaise/fatigue and weight loss.  HENT:  Negative for congestion, sinus pain and sore throat.   Eyes: Negative.   Respiratory:  Positive for cough and wheezing. Negative for hemoptysis, sputum production and shortness of breath.   Cardiovascular:  Negative for chest pain, palpitations, orthopnea, claudication and leg swelling.  Gastrointestinal:  Positive for heartburn. Negative for abdominal pain, nausea and vomiting.  Genitourinary: Negative.   Musculoskeletal:  Negative for joint pain and myalgias.  Skin:  Negative for rash.  Neurological:  Negative for weakness.  Endo/Heme/Allergies: Negative.   Psychiatric/Behavioral: Negative.     Objective:   Vitals:   10/11/23 1103  BP: (!) 100/55  Pulse: 66  SpO2: 96%  Weight: 165 lb 3.2 oz (74.9 kg)  Height: 5' 3 (1.6 m)    Physical Exam Constitutional:      General: She is not in acute distress.    Appearance: Normal appearance.  Eyes:     General: No scleral icterus.    Conjunctiva/sclera: Conjunctivae normal.  Cardiovascular:     Rate and Rhythm: Normal rate and regular rhythm.  Pulmonary:     Breath sounds: No wheezing, rhonchi or rales.  Musculoskeletal:     Right lower leg: No edema.     Left lower leg: No edema.  Skin:    General: Skin is warm and dry.  Neurological:     General: No focal deficit present.     CBC    Component Value Date/Time   WBC 7.7 09/21/2023 0428   RBC 4.72 09/21/2023 0428   HGB 13.6 09/21/2023 0428   HCT 41.7 09/21/2023 0428   PLT 240 09/21/2023 0428   MCV 88.3 09/21/2023 0428   MCH 28.8 09/21/2023 0428   MCHC 32.6 09/21/2023 0428   RDW 13.5 09/21/2023 0428   LYMPHSABS 1,893 07/26/2022 0850   MONOABS 0.4 07/25/2021 1111   EOSABS 380 07/26/2022 0850   BASOSABS 31 07/26/2022 0850      Latest Ref Rng & Units 09/21/2023    4:28 AM 09/20/2023    4:31 AM 09/18/2023     9:36 PM  BMP  Glucose 70 - 99 mg/dL 99  893  871   BUN 8 - 23 mg/dL 9  11  13    Creatinine 0.44 - 1.00 mg/dL 9.21  9.25  9.05   Sodium 135 - 145 mmol/L 140  139  138   Potassium 3.5 - 5.1 mmol/L 3.6  3.9  3.7   Chloride 98 - 111 mmol/L 106  106  102   CO2 22 - 32 mmol/L 24  24  25    Calcium  8.9 - 10.3 mg/dL 9.6  9.2  9.5    Chest imaging: CT Chest 06/14/23 Cardiovascular: Somewhat limited due to lack of IV contrast. Atherosclerotic calcifications of the aorta are noted. No cardiac enlargement is seen. Pulmonary artery is not significantly enlarged. Coronary  calcifications are noted.   Mediastinum/Nodes: Thoracic inlet is within normal limits. No hilar or mediastinal adenopathy is noted. Multiple calcified hilar and mediastinal lymph nodes are seen consistent with prior granulomatous disease. The esophagus as visualized is within normal limits.   Lungs/Pleura: Lungs are well aerated bilaterally. No focal infiltrate or effusion is seen. Calcified and noncalcified nodules are seen. The largest of the noncalcified lesions lies in the medial aspect of the right lower lobe measuring 4 mm. This is stable in appearance from the prior exam from 2015 consistent with a benign etiology. No further follow-up is recommended.  PFT:    Latest Ref Rng & Units 10/11/2023    9:51 AM  PFT Results  FVC-Pre L 2.53  P  FVC-Predicted Pre % 103  P  FVC-Post L 2.56  P  FVC-Predicted Post % 104  P  Pre FEV1/FVC % % 67  P  Post FEV1/FCV % % 76  P  FEV1-Pre L 1.70  P  FEV1-Predicted Pre % 93  P  FEV1-Post L 1.95  P  DLCO uncorrected ml/min/mmHg 21.83  P  DLCO UNC% % 120  P  DLCO corrected ml/min/mmHg 21.69  P  DLCO COR %Predicted % 119  P  DLVA Predicted % 117  P  TLC L 6.03  P  TLC % Predicted % 122  P  RV % Predicted % 159  P    P Preliminary result    Labs:  Path:  Echo:  Heart Catheterization:       Assessment & Plan:   Moderate persistent asthma without complication - Plan:  budesonide  (PULMICORT ) 0.25 MG/2ML nebulizer solution  Chronic cough - Plan: Ambulatory referral to ENT  Gastroesophageal reflux disease without esophagitis - Plan: famotidine  (PEPCID ) 20 MG tablet, pantoprazole  (PROTONIX ) 40 MG tablet  Discussion: Cheryl [name] is an 83 year old woman, former smoker with history of GERD, dementia and carotid stenosis who is referred to pulmonary clinic for shortness of breath.   Moderate persistent asthma Reversible obstruction noted on PFTs today - Continue Anoro Ellipta  inhaler, one puff daily.  - Continue albuterol  inhaler as needed. - Start budesonide  0.25mg  nebulizer treatment every other day (instructed to monitor her eye pressures closely with ophthalmology)  Chronic Cough Differential includes asthma vs GERD vs post nasal drip - start famotidine  20mg  at bedtime - start pantoprazole  40mg  daily, 30 minutes before breakfast with water - stop omeprazole  - use ipratropium nasal spray, 2 sprays per nostril twice daily - refer to ENT for further evaluation  Follow up in 3 months  Dorn Chill, MD [facility] Office: [phone]    Current Outpatient Medications:    budesonide  (PULMICORT ) 0.25 MG/2ML nebulizer solution, Take 2 mLs (0.25 mg total) by nebulization every other day., Disp: 30 mL, Rfl: 11   famotidine  (PEPCID ) 20 MG tablet, Take 1 tablet (20 mg total) by mouth at bedtime., Disp: , Rfl:    pantoprazole  (PROTONIX ) 40 MG tablet, Take 1 tablet (40 mg total) by mouth daily., Disp: 30 tablet, Rfl: 6   albuterol  (PROVENTIL ) (2.5 MG/3ML) 0.083% nebulizer solution, Take 3 mLs (2.5 mg total) by nebulization 3 (three) times daily for 3 days, THEN 3 mLs (2.5 mg total) every 6 (six) hours as needed for wheezing or shortness of breath., Disp: , Rfl:    albuterol  (VENTOLIN  HFA) 108 (90 Base) MCG/ACT inhaler, Inhale 2 puffs into the lungs every 6 (six) hours as needed for wheezing or shortness of breath., Disp: 8 g,  Rfl: 0    aspirin  81 MG tablet, Take 81 mg by mouth daily., Disp: , Rfl:    Cholecalciferol (VITAMIN D -3 PO), Take 1 capsule by mouth daily., Disp: , Rfl:    cycloSPORINE  (RESTASIS ) 0.05 % ophthalmic emulsion, Place 1 drop into both eyes 2 (two) times daily., Disp: 5.5 mL, Rfl: 5   escitalopram  (LEXAPRO ) 10 MG tablet, Take 1 tablet (10 mg total) by mouth daily., Disp: 90 tablet, Rfl: 1   Evolocumab  (REPATHA  SURECLICK) 140 MG/ML SOAJ, Inject 140 mg into the skin every 14 (fourteen) days. (Patient taking differently: Inject 140 mg into the skin See admin instructions. Inject 1mL (140mg ) into the skin every other Monday.), Disp: 6 mL, Rfl: 3   ipratropium (ATROVENT ) 0.03 % nasal spray, Place 2 sprays into both nostrils every 12 (twelve) hours. (Patient taking differently: Place 2 sprays into both nostrils 2 (two) times daily as needed for rhinitis.), Disp: 30 mL, Rfl: 12   loratadine  (CLARITIN ) 10 MG tablet, Take 1 tablet (10 mg total) by mouth daily. (Patient not taking: Reported on 10/11/2023), Disp: 30 tablet, Rfl: 1   MAGNESIUM  PO, Take 1 tablet by mouth daily., Disp: , Rfl:    Multiple Vitamins-Minerals (MULTIVITAMIN WOMEN 50+) TABS, Take 1 tablet by mouth daily., Disp: , Rfl:    nystatin  (MYCOSTATIN /NYSTOP ) powder, Apply 1 Application topically 2 (two) times daily as needed., Disp: 60 g, Rfl: 2   Omega-3 Fatty Acids (FISH OIL  PO), Take 2 capsules by mouth daily., Disp: , Rfl:    PREMARIN  vaginal cream, Place 1 applicator vaginally See admin instructions. Apply a small amount vaginally 3-4 nights per week., Disp: , Rfl:    Probiotic Product (PROBIOTIC PO), Take 1 capsule by mouth daily., Disp: , Rfl:    rivastigmine  (EXELON ) 9.5 mg/24hr, Place 9.5 mg onto the skin daily., Disp: , Rfl:    traMADol  (ULTRAM ) 50 MG tablet, Take 1 tablet (50 mg total) by mouth 2 (two) times daily as needed. for pain, Disp: 60 tablet, Rfl: 1   TURMERIC PO, Take 2 each by mouth daily., Disp: , Rfl:    umeclidinium-vilanterol (ANORO  ELLIPTA) 62.5-25 MCG/ACT AEPB, Inhale 1 puff into the lungs daily., Disp: 60 each, Rfl: 5

## 2023-10-11 NOTE — Patient Instructions (Addendum)
 For Reflux: - start famotidine  20mg  at bedtime - start pantoprazole  40mg  daily, 30 minutes before breakfast with water - stop omeprazole   Recommend calling [facility] for further evaluation  We will refer you Dr. Okey in ENT for further evaluation  Try not to clear your throat as frequently which can lead to cough. Use sugar free candies or throat lozenges to help decrease cough.  Continue anoro ellipta  1 puff daily  Start budesonide  nebulizer treatments every other day  Use ipratropium nasal spray, 2 sprays per nostril twice daily  Follow up in 3 months

## 2023-10-11 NOTE — Patient Instructions (Signed)
 Full pft performed today

## 2023-10-11 NOTE — Progress Notes (Signed)
 Full pft performed today

## 2023-10-13 ENCOUNTER — Encounter: Payer: Self-pay | Admitting: Pulmonary Disease

## 2023-11-06 ENCOUNTER — Ambulatory Visit: Admitting: Pulmonary Disease

## 2023-11-30 ENCOUNTER — Institutional Professional Consult (permissible substitution) (INDEPENDENT_AMBULATORY_CARE_PROVIDER_SITE_OTHER): Admitting: Otolaryngology

## 2023-12-01 ENCOUNTER — Institutional Professional Consult (permissible substitution) (INDEPENDENT_AMBULATORY_CARE_PROVIDER_SITE_OTHER): Admitting: Otolaryngology

## 2023-12-04 ENCOUNTER — Encounter (INDEPENDENT_AMBULATORY_CARE_PROVIDER_SITE_OTHER): Payer: Self-pay

## 2023-12-04 ENCOUNTER — Ambulatory Visit (INDEPENDENT_AMBULATORY_CARE_PROVIDER_SITE_OTHER)

## 2023-12-04 VITALS — BP 116/67 | HR 59 | Temp 98.0°F | Wt 161.0 lb

## 2023-12-04 DIAGNOSIS — R040 Epistaxis: Secondary | ICD-10-CM

## 2023-12-04 DIAGNOSIS — R09A0 Foreign body sensation, unspecified: Secondary | ICD-10-CM | POA: Diagnosis not present

## 2023-12-04 DIAGNOSIS — R053 Chronic cough: Secondary | ICD-10-CM | POA: Diagnosis not present

## 2023-12-04 DIAGNOSIS — J370 Chronic laryngitis: Secondary | ICD-10-CM

## 2023-12-04 NOTE — Progress Notes (Signed)
 HPI:  Discussed the use of AI scribe software for clinical note transcription with the patient, who gave verbal consent to proceed.  History of Present Illness Cheryl [name] is an 83 year old female who presents with persistent throat mucus and cough. She was referred by Dr. Kara, a pulmonologist, for evaluation of her throat symptoms. Patients daughter is present and helping in providing history.   She has experienced a persistent sensation of mucus in her throat for about a year, describing it as either clear white or sometimes yellow. Despite efforts, she is unable to clear it completely, although she can cough it up. This mucus affects her voice, with severity varying from day to day. No throat pain or metallic taste is reported.  She has a history of smoking, having quit in 1993 after approximately 20 years of smoking, possibly up to a pack a day. She does not recall any history of throat or vocal cord surgery, and she denies any use of chewing tobacco. She denies other drug use.  She has been using Allegra and ipratropium bromide  nasal spray for about three months, following a hospitalization for lung issues where her oxygen saturation was below 90%. During that time, she received IV antibiotics and low-dose steroids. She notes that the nasal spray may help, as she notices a difference when not using it.  She experiences nasal drainage correlating with the timeline of her throat symptoms and frequently uses cough drops. She drinks a lot of water daily but feels that when the cough hits, she cannot do anything but cough. Her daughter notes that she does clear her throat frequently and coughs voluntarily frequently as well.     PMH/Meds/All/SocHx/FamHx/ROS: Past Medical History:  Diagnosis Date   Anemia    Anxiety    d/t recent family issues   Arthritis    Bone spur    Carotid stenosis, bilateral 06/06/2019   Chronic back pain    stenosis and OA   Dementia (HCC)    Depression     takes Paxil  daily   Diverticulosis    Dizzy    occasionally   Encephalitis    at 9 months ago    Fatty liver    has had 2 Hep shots and the final one is in June 18   GERD (gastroesophageal reflux disease)    takes Omeprazole  daily   Hepatitis 2012   History of bronchitis    20-25 yrs ago   History of colon polyps    precancerous   History of shingles    Hyperlipidemia    takes Simvastatin  daily   IBS (irritable bowel syndrome)    more on side of constipation-takes Miralax  daily   Insomnia    takes Melatonin and Ambien  nightly   Internal hemorrhoids    Joint pain    Joint swelling    Lower leg edema 09/21/2021   Lung nodules    calcified    Migraine headache    migraines-thinks coming from neck.Last one1/28/18   Orthostatic hypotension 09/21/2021   Pneumonia    hx of-20 yrs ago-=-walking   Renal cyst    left   Uterine cancer (HCC) 1973   Past Surgical History:  Procedure Laterality Date   APPENDECTOMY  1966   cataracts     bilateral   COLONOSCOPY  09/04/2020   EUS  02/10/2011   Procedure: UPPER ENDOSCOPIC ULTRASOUND (EUS) RADIAL;  Surgeon: Toribio Cedar, MD;  Location: [facility];  Service: Endoscopy;  Laterality: N/A;  radial linear    LUMBAR FUSION  03/2016   L4-5, Dr. Epifanio.   RECTOCELE REPAIR  2011   ROTATOR CUFF REPAIR  2009   left   TONSILLECTOMY     UPPER GASTROINTESTINAL ENDOSCOPY     VAGINAL HYSTERECTOMY  1973   No family history of bleeding disorders, wound healing problems or difficulty with anesthesia.  Social Connections: Socially Isolated (09/19/2023)   Social Connection and Isolation Panel    Frequency of Communication with Friends and Family: More than three times a week    Frequency of Social Gatherings with Friends and Family: Three times a week    Attends Religious Services: Never    Active Member of Clubs or Organizations: No    Attends Banker Meetings: Never    Marital Status: Divorced    Current Outpatient  Medications:    albuterol  (PROVENTIL ) (2.5 MG/3ML) 0.083% nebulizer solution, Take 3 mLs (2.5 mg total) by nebulization 3 (three) times daily for 3 days, THEN 3 mLs (2.5 mg total) every 6 (six) hours as needed for wheezing or shortness of breath., Disp: , Rfl:    albuterol  (VENTOLIN  HFA) 108 (90 Base) MCG/ACT inhaler, Inhale 2 puffs into the lungs every 6 (six) hours as needed for wheezing or shortness of breath., Disp: 8 g, Rfl: 0   aspirin  81 MG tablet, Take 81 mg by mouth daily., Disp: , Rfl:    budesonide  (PULMICORT ) 0.25 MG/2ML nebulizer solution, Take 2 mLs (0.25 mg total) by nebulization every other day., Disp: 30 mL, Rfl: 11   Cholecalciferol (VITAMIN D -3 PO), Take 1 capsule by mouth daily., Disp: , Rfl:    cycloSPORINE  (RESTASIS ) 0.05 % ophthalmic emulsion, Place 1 drop into both eyes 2 (two) times daily., Disp: 5.5 mL, Rfl: 5   escitalopram  (LEXAPRO ) 10 MG tablet, Take 1 tablet (10 mg total) by mouth daily., Disp: 90 tablet, Rfl: 1   Evolocumab  (REPATHA  SURECLICK) 140 MG/ML SOAJ, Inject 140 mg into the skin every 14 (fourteen) days. (Patient taking differently: Inject 140 mg into the skin See admin instructions. Inject 1mL (140mg ) into the skin every other Monday.), Disp: 6 mL, Rfl: 3   famotidine  (PEPCID ) 20 MG tablet, Take 1 tablet (20 mg total) by mouth at bedtime., Disp: , Rfl:    ipratropium (ATROVENT ) 0.03 % nasal spray, Place 2 sprays into both nostrils every 12 (twelve) hours. (Patient taking differently: Place 2 sprays into both nostrils 2 (two) times daily as needed for rhinitis.), Disp: 30 mL, Rfl: 12   MAGNESIUM  PO, Take 1 tablet by mouth daily., Disp: , Rfl:    Multiple Vitamins-Minerals (MULTIVITAMIN WOMEN 50+) TABS, Take 1 tablet by mouth daily., Disp: , Rfl:    nystatin  (MYCOSTATIN /NYSTOP ) powder, Apply 1 Application topically 2 (two) times daily as needed., Disp: 60 g, Rfl: 2   Omega-3 Fatty Acids (FISH OIL  PO), Take 2 capsules by mouth daily., Disp: , Rfl:    pantoprazole   (PROTONIX ) 40 MG tablet, Take 1 tablet (40 mg total) by mouth daily., Disp: 30 tablet, Rfl: 6   PREMARIN  vaginal cream, Place 1 applicator vaginally See admin instructions. Apply a small amount vaginally 3-4 nights per week., Disp: , Rfl:    Probiotic Product (PROBIOTIC PO), Take 1 capsule by mouth daily., Disp: , Rfl:    rivastigmine  (EXELON ) 9.5 mg/24hr, Place 9.5 mg onto the skin daily., Disp: , Rfl:    traMADol  (ULTRAM ) 50 MG tablet, Take 1 tablet (50 mg total) by mouth 2 (two) times daily  as needed. for pain, Disp: 60 tablet, Rfl: 1   TURMERIC PO, Take 2 each by mouth daily., Disp: , Rfl:    umeclidinium-vilanterol (ANORO ELLIPTA ) 62.5-25 MCG/ACT AEPB, Inhale 1 puff into the lungs daily., Disp: 60 each, Rfl: 5   loratadine  (CLARITIN ) 10 MG tablet, Take 1 tablet (10 mg total) by mouth daily. (Patient not taking: Reported on 12/04/2023), Disp: 30 tablet, Rfl: 1 A complete ROS was performed with pertinent positives/negatives noted in the HPI. The remainder of the ROS are negative.   Physical Exam:  BP 116/67 (BP Location: Left Arm, Patient Position: Sitting, Cuff Size: Normal)   Pulse (!) 59   Temp 98 F (36.7 C) (Oral)   Wt 161 lb (73 kg)   SpO2 93%   BMI 28.52 kg/m  General: Well developed, well nourished. No acute distress. Voice hoarse Head/Face: Normocephalic. No sinus tenderness. Facial nerve intact and equal bilaterally. No facial lacerations. Eyes: PERRL, no scleral icterus or conjunctival hemorrhage. EOMI. Ears: No gross deformity. Normal external canal. Tympanic membrane in tact bilaterally Hearing: Normal speech reception. Nose: No gross deformity or lesions. No purulent discharge. No turbinate hypertrophy. Dry nasal mucosa with blood crusting at the anterior nasal septum on the right Mouth/Oropharynx: Lips without any lesions. Dentition good. No mucosal lesions within the oropharynx. No tonsillar enlargement, exudate, or lesions. Pharyngeal walls symmetrical. Uvula midline.  Tongue midline without lesions. Larynx: See TFL if applicable Nasopharynx: See TFL if applicable Neck: Trachea midline. No masses. No thyromegaly or nodules palpated. No crepitus. Lymphatic: No lymphadenopathy in the neck. Respiratory: No stridor or distress. Room air. Cardiovascular: Regular rate and rhythm. Extremities: No edema or cyanosis. Warm and well-perfused. Skin: No scars or lesions on face or neck. Neurologic: CN II-XII grossly intact. Moving all extremities without gross abnormality. Other:  Independent Review of Additional Tests or Records: None Procedures: Flexible Fiberoptic Laryngoscopy Procedure Note  Date of procedure 12/04/2023 Pre-Op Diagnosis: globus    Post Op Diagnosis: same Procedure: Flexible Fiberoptic Laryngoscopy CPT 31575 - Mod 25 Surgeon: Adah Malkin, D.O. Anesthesia: 4% Lidocaine  with afrin Findings:  - bilateral vocal cord motion with dry laryngeal mucosa - no masses, lesions, or ulcerations Procedure Detail: After verbal consent was obtained from the patient, the patient was brought in an upright position, a fiberoptic nasal laryngoscope was then passed into the patient right nasal passage and left nasal passage, the right appeared to be more patent. It was passed along the floor of the nasal cavity to the nasopharynx. Torus tubarius was patent and the Fossa of Rosenmller was identified. The scope was then flexed caudally and advanced slowly through the nasopharynx, passed through the oropharynx, and down into the hypopharynx. The patient's oro- and nasopharynx were unremarkable with no signs of any gross lesions, edema, masses, or bleeding.  The base of tongue was visualized and no mass, ulceration or lesion was appreciated. The epiglottis did not demonstrate any mass, ulceration or lesion. The vallecula was also assessed with no mass, ulceration or lesion. The patient had good glottic closure upon phonation and no signs of aspiration or pooling of  secretions. The patient was asked to inspire and expire with the true vocal folds vibrating normally and without evidence of vocal fold dysfunction. The true and false vocal cords, interarytenoid, AE folds, and arytenoids did not demonstrate any significant edema or erythema. The patient was then asked to valsalva, and the pyriform sinuses were assessed which were unremarkable. The airway was patent and there was no evidence of  compromise. The scope was then slowly withdrawn from the patient. The patient tolerated the procedure well and there were no complications.  Disposition: Stable  Impression & Plans: Assessment & Plan Chronic throat irritation with cough and mucus Chronic throat irritation with mucus affecting voice, likely due to dryness and irritation. Laryngoscopy showed no masses or significant abnormalities. - Discontinued Allegra and ipratropium bromide . - Initiated Pretz nasal spray or AYR nasal saline gel. - Advised use of humidifier in living room and bedroom. - Encouraged sipping water instead of throat clearing to prevent cycle of constant throat clearing which can be harmful to the vocal cords. - Follow up in two months for potential video strobe examination if symptoms persist.  Dry nose with minor epistaxis Dry nose with minor epistaxis likely due to environmental dryness and previous drying medications. - Use Pretz nasal spray or AYR nasal saline gel. - Utilize humidifier in living spaces.  Follow-up in 2 months  Adah Malkin, DO [facility]

## 2024-01-08 ENCOUNTER — Ambulatory Visit: Admitting: Pulmonary Disease

## 2024-01-08 ENCOUNTER — Encounter: Payer: Self-pay | Admitting: Pulmonary Disease

## 2024-01-08 VITALS — BP 120/73 | HR 66 | Ht 63.0 in | Wt 159.0 lb

## 2024-01-08 DIAGNOSIS — Z87891 Personal history of nicotine dependence: Secondary | ICD-10-CM | POA: Diagnosis not present

## 2024-01-08 DIAGNOSIS — J4540 Moderate persistent asthma, uncomplicated: Secondary | ICD-10-CM | POA: Diagnosis not present

## 2024-01-08 DIAGNOSIS — R053 Chronic cough: Secondary | ICD-10-CM | POA: Diagnosis not present

## 2024-01-08 MED ORDER — ALBUTEROL SULFATE (2.5 MG/3ML) 0.083% IN NEBU: 2.5000 mg | INHALATION_SOLUTION | Freq: Four times a day (QID) | RESPIRATORY_TRACT | 6 refills | Status: AC | PRN

## 2024-01-08 MED ORDER — BUDESONIDE 0.25 MG/2ML IN SUSP: 0.2500 mg | RESPIRATORY_TRACT | 11 refills | Status: AC

## 2024-01-08 NOTE — Progress Notes (Unsigned)
 Established Patient Pulmonology Office Visit   Subjective:  Patient ID: Cheryl [name], female    DOB: September 29, 1940  MRN: [id number]  CC:  Chief Complaint  Patient presents with   Medical Management of Chronic Issues    Pt states thickness throat / yellowish     Discussed the use of AI scribe software for clinical note transcription with the patient, who gave verbal consent to proceed.  History of Present Illness Cheryl [name] is an 83 year old woman, former smoker with history of GERD, dementia and carotid stenosis who returns to pulmonary clinic for asthma and chronic cough.   She has ongoing shortness of breath and intermittent wheezing. She uses albuterol  as needed, Anoro inhaler daily, and was prescribed budesonide  0.25 mg nebulizer every other day due to concern for glaucoma at last visit but did not start this medication.  She has a persistent cough and frequent throat clearing, attributed by ENT to thick mucus between the vocal cords. ENT advised stopping ipratropium nasal spray because of nasal dryness and bleeding and recommended a moisturizing nasal spray, but she still has cough and throat clearing, worse when not distracted. No edema noted by ENT in the upper airway.  Pantoprazole  was started at the last visit for suspected GERD-related cough, taken in the morning without improvement. She has not started famotidine  because of a prescription issue.  She uses saline nasal spray once daily and sugar-free lozenges for throat dryness and cough. Her daughter notes the cough is less prominent during social interactions.        ROS    Current Outpatient Medications:    albuterol  (VENTOLIN  HFA) 108 (90 Base) MCG/ACT inhaler, Inhale 2 puffs into the lungs every 6 (six) hours as needed for wheezing or shortness of breath., Disp: 8 g, Rfl: 0   aspirin  81 MG tablet, Take 81 mg by mouth daily., Disp: , Rfl:    Cholecalciferol (VITAMIN D -3 PO), Take 1 capsule by mouth daily., Disp: ,  Rfl:    cycloSPORINE  (RESTASIS ) 0.05 % ophthalmic emulsion, Place 1 drop into both eyes 2 (two) times daily., Disp: 5.5 mL, Rfl: 5   escitalopram  (LEXAPRO ) 10 MG tablet, Take 1 tablet (10 mg total) by mouth daily., Disp: 90 tablet, Rfl: 1   Evolocumab  (REPATHA  SURECLICK) 140 MG/ML SOAJ, Inject 140 mg into the skin every 14 (fourteen) days. (Patient taking differently: Inject 140 mg into the skin See admin instructions. Inject 1mL (140mg ) into the skin every other Monday.), Disp: 6 mL, Rfl: 3   loratadine  (CLARITIN ) 10 MG tablet, Take 1 tablet (10 mg total) by mouth daily., Disp: 30 tablet, Rfl: 1   MAGNESIUM  PO, Take 1 tablet by mouth daily., Disp: , Rfl:    Multiple Vitamins-Minerals (MULTIVITAMIN WOMEN 50+) TABS, Take 1 tablet by mouth daily., Disp: , Rfl:    nystatin  (MYCOSTATIN /NYSTOP ) powder, Apply 1 Application topically 2 (two) times daily as needed., Disp: 60 g, Rfl: 2   Omega-3 Fatty Acids (FISH OIL  PO), Take 2 capsules by mouth daily., Disp: , Rfl:    PREMARIN  vaginal cream, Place 1 applicator vaginally See admin instructions. Apply a small amount vaginally 3-4 nights per week., Disp: , Rfl:    Probiotic Product (PROBIOTIC PO), Take 1 capsule by mouth daily., Disp: , Rfl:    rivastigmine  (EXELON ) 9.5 mg/24hr, Place 9.5 mg onto the skin daily., Disp: , Rfl:    traMADol  (ULTRAM ) 50 MG tablet, Take 1 tablet (50 mg total) by mouth 2 (two) times daily  as needed. for pain, Disp: 60 tablet, Rfl: 1   TURMERIC PO, Take 2 each by mouth daily., Disp: , Rfl:    umeclidinium-vilanterol (ANORO ELLIPTA ) 62.5-25 MCG/ACT AEPB, Inhale 1 puff into the lungs daily., Disp: 60 each, Rfl: 5   albuterol  (PROVENTIL ) (2.5 MG/3ML) 0.083% nebulizer solution, Take 3 mLs (2.5 mg total) by nebulization every 6 (six) hours as needed for wheezing or shortness of breath., Disp: 75 mL, Rfl: 6   budesonide  (PULMICORT ) 0.25 MG/2ML nebulizer solution, Take 2 mLs (0.25 mg total) by nebulization every other day., Disp: 30 mL,  Rfl: 11      Objective:  BP 120/73   Pulse 66   Ht 5' 3 (1.6 m) Comment: per pt  Wt 159 lb (72.1 kg)   SpO2 96%   BMI 28.17 kg/m     Physical Exam Constitutional:      General: She is not in acute distress.    Appearance: Normal appearance.  Eyes:     General: No scleral icterus.    Conjunctiva/sclera: Conjunctivae normal.  Cardiovascular:     Rate and Rhythm: Normal rate and regular rhythm.  Pulmonary:     Breath sounds: No wheezing, rhonchi or rales.  Musculoskeletal:     Right lower leg: No edema.     Left lower leg: No edema.  Skin:    General: Skin is warm and dry.  Neurological:     General: No focal deficit present.      Diagnostic Review:       Assessment & Plan:   Assessment & Plan Moderate persistent asthma without complication  Orders:   budesonide  (PULMICORT ) 0.25 MG/2ML nebulizer solution; Take 2 mLs (0.25 mg total) by nebulization every other day.   albuterol  (PROVENTIL ) (2.5 MG/3ML) 0.083% nebulizer solution; Take 3 mLs (2.5 mg total) by nebulization every 6 (six) hours as needed for wheezing or shortness of breath.  Chronic cough      Assessment and Plan Assessment & Plan Chronic cough Upper airway cough symdrome. GERD treatment initiated with pantoprazole . No significant findings on scope to suggest GERD as primary cause. ENT recommended frequent sips to clear throat. - Discontinued ipratropium nasal spray. - Continue pantoprazole  for 2-4 weeks and ok to lay flat when sleeping. After laying flat again and no change in cough, can discontinue pantoprazole  if no worsening. - Consider speech therapy for chronic cough management. - Use saline spray as needed for nasal dryness. - Avoid menthol  cough drops; consider Ricola or Jolly Rancher sugar-free lozenges. - Monitor cough and adjust treatment as needed.  Moderate persistent asthma Asthma with occasional nocturnal wheezing. Current treatment includes Anoro Ellipta  and albuterol .  Budesonide  prescribed to reduce albuterol  need and decrease cough or wheezing. - Continue Anoro Ellipta , one puff daily. - Start budesonide  nebulizer every other day, monitor for side effects. - Use albuterol  as needed for wheezing. - Monitor for improvement in wheezing and adjust treatment as needed.      Return in about 4 months (around 05/08/2024) for f/u visit Dr. Kara.   Dorn KATHEE Kara, MD

## 2024-01-08 NOTE — Assessment & Plan Note (Signed)
 Cheryl [name]

## 2024-01-08 NOTE — Patient Instructions (Addendum)
 Ok to go back to laying flat at bedtime then if cough symptoms no worse, can stop the protonix  given lack of reflux symptoms.  No need to start famotidine  medication  Continue saline nasal spray   Try not to clear your throat as frequently which can lead to cough. Use sugar free candies or throat lozenges to help decrease cough.  Continue anoro ellipta  1 puff daily  Start budesonide  nebulizer treatments every other day  Use albuterol  nebulizer treatment every 4-6 hours as needed  Follow up in 4 months

## 2024-01-09 ENCOUNTER — Encounter: Payer: Self-pay | Admitting: Pulmonary Disease

## 2024-01-10 ENCOUNTER — Other Ambulatory Visit: Payer: Self-pay | Admitting: Family Medicine

## 2024-01-10 DIAGNOSIS — R112 Nausea with vomiting, unspecified: Secondary | ICD-10-CM

## 2024-01-10 DIAGNOSIS — R102 Pelvic and perineal pain unspecified side: Secondary | ICD-10-CM | POA: Diagnosis not present

## 2024-01-16 ENCOUNTER — Encounter: Payer: Self-pay | Admitting: Pulmonary Disease

## 2024-01-16 ENCOUNTER — Other Ambulatory Visit: Payer: Self-pay | Admitting: Family Medicine

## 2024-01-16 DIAGNOSIS — R112 Nausea with vomiting, unspecified: Secondary | ICD-10-CM

## 2024-01-16 NOTE — Progress Notes (Signed)
 Candice, from [facility] returned call concerning pt's allergies, stated that Dr. Chet has sent new order over today that states to do the CT of Abd without contrast, order noted.  Jolan from scheduling notified

## 2024-01-16 NOTE — Progress Notes (Deleted)
 SABRA

## 2024-01-16 NOTE — Progress Notes (Signed)
 Received a call from [facility] in scheduling that this patient's is scheduled for a CT W/WO contrast on 01/22/24 and her POA/Family wants her to have the 13 hour prep R/T to allergies. This RN informed that there is no allergy to contrast documented in the chart.  Upon looking into patient's chart, several allergies noted, decadron  and amipaque noted.  This RN reached out to ordering provider's Cy Agreste, MD) office for clarification and/or order for 13 hour prep as DRI uses prednisone  and benadryl .  Spoke with Candice at [facility], awaiting a return phone call.

## 2024-01-22 ENCOUNTER — Inpatient Hospital Stay
Admission: RE | Admit: 2024-01-22 | Discharge: 2024-01-22 | Disposition: A | Source: Ambulatory Visit | Attending: Family Medicine

## 2024-01-22 ENCOUNTER — Ambulatory Visit
Admission: RE | Admit: 2024-01-22 | Discharge: 2024-01-22 | Disposition: A | Discharge status: Home / Self Care | Source: Ambulatory Visit | Attending: Family Medicine | Admitting: Family Medicine

## 2024-01-22 DIAGNOSIS — R112 Nausea with vomiting, unspecified: Secondary | ICD-10-CM

## 2024-01-22 DIAGNOSIS — K5730 Diverticulosis of large intestine without perforation or abscess without bleeding: Secondary | ICD-10-CM | POA: Diagnosis not present

## 2024-01-30 ENCOUNTER — Ambulatory Visit (INDEPENDENT_AMBULATORY_CARE_PROVIDER_SITE_OTHER): Admitting: Otolaryngology

## 2024-02-07 ENCOUNTER — Other Ambulatory Visit: Payer: Self-pay | Admitting: Pulmonary Disease

## 2024-02-07 DIAGNOSIS — J4530 Mild persistent asthma, uncomplicated: Secondary | ICD-10-CM

## 2024-02-28 ENCOUNTER — Encounter (INDEPENDENT_AMBULATORY_CARE_PROVIDER_SITE_OTHER): Payer: Self-pay | Admitting: Otolaryngology

## 2024-02-28 ENCOUNTER — Ambulatory Visit (INDEPENDENT_AMBULATORY_CARE_PROVIDER_SITE_OTHER): Admitting: Otolaryngology

## 2024-02-28 VITALS — BP 118/63 | HR 64 | Wt 153.0 lb

## 2024-02-28 DIAGNOSIS — R053 Chronic cough: Secondary | ICD-10-CM | POA: Diagnosis not present

## 2024-02-28 DIAGNOSIS — J019 Acute sinusitis, unspecified: Secondary | ICD-10-CM

## 2024-02-28 MED ORDER — AMOXICILLIN-POT CLAVULANATE 875-125 MG PO TABS: 1.0000 | ORAL_TABLET | Freq: Two times a day (BID) | ORAL | 0 refills | Status: AC

## 2024-02-28 NOTE — Progress Notes (Signed)
 Reason for Consult: Cough and throat clearing Referring Physician: None  Cheryl [name] is an 84 y.o. female.  HPI: Patient is here for follow-up from Dr. Everitt Harrop's evaluation.  Recommendations were made for stop throat clearing and medications were adjusted.  No findings on fiberoptic exam.  The patient has been treated for reflux.  Currently she has been having acute nasal symptoms with obstruction and congestion.  She is very frustrated.  This has been going on for about 1 week.  She has been on Mucinex  and Allegra.  No antibiotics.  She has yellow drainage with her cough at this point.  She still is having a cough.  She has had some episodes of waking up in the middle of the night with vomiting up some of her food.  She is set to see a GI doctor here soon.  Past Medical History:  Diagnosis Date   Anemia    Anxiety    d/t recent family issues   Arthritis    Bone spur    Carotid stenosis, bilateral 06/06/2019   Chronic back pain    stenosis and OA   Dementia (HCC)    Depression    takes Paxil  daily   Diverticulosis    Dizzy    occasionally   Encephalitis    at 9 months ago    Fatty liver    has had 2 Hep shots and the final one is in June 18   GERD (gastroesophageal reflux disease)    takes Omeprazole  daily   Hepatitis 2012   History of bronchitis    20-25 yrs ago   History of colon polyps    precancerous   History of shingles    Hyperlipidemia    takes Simvastatin  daily   IBS (irritable bowel syndrome)    more on side of constipation-takes Miralax  daily   Insomnia    takes Melatonin and Ambien  nightly   Internal hemorrhoids    Joint pain    Joint swelling    Lower leg edema 09/21/2021   Lung nodules    calcified    Migraine headache    migraines-thinks coming from neck.Last one1/28/18   Orthostatic hypotension 09/21/2021   Pneumonia    hx of-20 yrs ago-=-walking   Renal cyst    left   Uterine cancer (HCC) 1973    Past Surgical History:  Procedure  Laterality Date   APPENDECTOMY  1966   cataracts     bilateral   COLONOSCOPY  09/04/2020   EUS  02/10/2011   Procedure: UPPER ENDOSCOPIC ULTRASOUND (EUS) RADIAL;  Surgeon: Toribio Cedar, MD;  Location: [facility];  Service: Endoscopy;  Laterality: N/A;  radial linear    LUMBAR FUSION  03/2016   L4-5, Dr. Epifanio.   RECTOCELE REPAIR  2011   ROTATOR CUFF REPAIR  2009   left   TONSILLECTOMY     UPPER GASTROINTESTINAL ENDOSCOPY     VAGINAL HYSTERECTOMY  1973    Family History  Problem Relation Age of Onset   Kidney disease Mother    Kidney failure Mother        med induced   Rheum arthritis Mother    Memory loss Mother    Heart disease Father    Alcohol abuse Father    Lung cancer Father        smoker   Depression Brother    Crohn's disease Brother    Alcohol abuse Brother    Heart failure Daughter    Lung  cancer Son    HIV Son    Heart disease Other        Paternal family    Malignant hyperthermia Neg Hx    Colon cancer Neg Hx    Esophageal cancer Neg Hx    Rectal cancer Neg Hx    Stomach cancer Neg Hx    Migraines Neg Hx     Social History:  reports that she quit smoking about 31 years ago. Her smoking use included cigarettes. She has never used smokeless tobacco. She reports that she does not drink alcohol and does not use drugs.  Allergies: Allergies[1]   No results found. However, due to the size of the patient record, not all encounters were searched. Please check Results Review for a complete set of results.  No results found.  ROS Weight 153 lb (69.4 kg). Physical Exam Constitutional:      Appearance: Normal appearance.  HENT:     Head: Normocephalic and atraumatic.     Right Ear: Tympanic membrane is without lesions and middle ear aerated, ear canal and external ear normal.     Left Ear: Tympanic membrane is without lesions and middle ear aerated, ear canal and external ear normal.     Nose: Nose without deviation of septum.  Turbinates with  moderate hypertrophy and some exudate in the nose in various locations no significant swelling or masses.     Oral cavity/oropharynx: Mucous membranes are moist. No lesions or masses    Larynx: normal voice. Mirror attempted without success    Eyes:     Extraocular Movements: Extraocular movements intact.     Conjunctiva/sclera: Conjunctivae normal.     Pupils: Pupils are equal, round, and reactive to light.  Cardiovascular:     Rate and Rhythm: Normal rate.  Pulmonary:     Effort: Pulmonary effort is normal.  Musculoskeletal:     Cervical back: Normal range of motion and neck supple. No rigidity.  Lymphadenopathy:     Cervical: No cervical adenopathy or masses.salivary glands without lesions. .     Salivary glands- no mass or swelling Neurological:     Mental Status: He is alert. CN 2-12 intact. No nystagmus      Assessment/Plan: Acute sinusitis-right now I will treat her with Augmentin  and she can continue her Mucinex .  I would recommend saline irrigation.  I would recommend Afrin spray for only 5 days and irrigate with saline after about 30 minutes.  It is unclear if the sinus may have something to do with the cough on a chronic basis but she did have a clear MRI of the sinuses in 2025.  That makes it unlikely chronic sinusitis is the issue and this is just an acute infection.  She should follow-up for the cough after about a month and she is going to see GI and that may be also beneficial as reflux is still a strong possibility as the cause of the cough.  Cheryl [name] 02/28/2024, 10:17 AM         [1]  Allergies Allergen Reactions   Celebrex [Celecoxib] Itching, Nausea Only and Other (See Comments)    Gas Hair loss   Prozac [Fluoxetine] Shortness Of Breath and Itching   Ambien  [Zolpidem ] Other (See Comments)    Headaches    Amipaque [Metrizamide] Itching   Aricept  [Donepezil  Hcl] Other (See Comments)    Nightmares    Cipro  [Ciprofloxacin  Hcl] Other (See Comments)     Myopathy    Crestor  [Rosuvastatin ] Other (  See Comments)    Myalgias    Decadron  [Dexamethasone ] Other (See Comments)    *High risk for increased intra-ocular pressure -> steroid-induced glaucoma   Elavil  [Amitriptyline ] Other (See Comments)    Dry mouth    Livalo  [Pitavastatin ] Other (See Comments)    Myalgias   Multihance  [Gadobenate] Itching   Nitrostat  [Nitroglycerin ] Nausea Only and Other (See Comments)    Dizziness Diaphoresis    Statins Other (See Comments)    Myalgias     Zocor  [Simvastatin ] Other (See Comments)    Myalgias Headaches    Codeine Sulfate Itching   Sulfonamide Derivatives Nausea Only

## 2024-03-06 ENCOUNTER — Encounter (INDEPENDENT_AMBULATORY_CARE_PROVIDER_SITE_OTHER): Payer: Self-pay | Admitting: Otolaryngology

## 2024-04-03 ENCOUNTER — Ambulatory Visit (INDEPENDENT_AMBULATORY_CARE_PROVIDER_SITE_OTHER): Admitting: Otolaryngology
